# Patient Record
Sex: Male | Born: 1945
Health system: Southern US, Community
[De-identification: ages and names within clinical notes are randomized; demographics above are authoritative.]

## PROBLEM LIST (undated history)

## (undated) ENCOUNTER — Emergency Department (HOSPITAL_COMMUNITY): Admission: EM | Payer: Medicare Other | Source: Home / Self Care

## (undated) DIAGNOSIS — N182 Chronic kidney disease, stage 2 (mild): Secondary | ICD-10-CM

## (undated) DIAGNOSIS — I639 Cerebral infarction, unspecified: Secondary | ICD-10-CM

## (undated) DIAGNOSIS — G459 Transient cerebral ischemic attack, unspecified: Secondary | ICD-10-CM

## (undated) DIAGNOSIS — I5042 Chronic combined systolic (congestive) and diastolic (congestive) heart failure: Secondary | ICD-10-CM

## (undated) DIAGNOSIS — C9590 Leukemia, unspecified not having achieved remission: Secondary | ICD-10-CM

## (undated) DIAGNOSIS — I2510 Atherosclerotic heart disease of native coronary artery without angina pectoris: Secondary | ICD-10-CM

## (undated) DIAGNOSIS — G510 Bell's palsy: Secondary | ICD-10-CM

## (undated) DIAGNOSIS — I10 Essential (primary) hypertension: Secondary | ICD-10-CM

## (undated) DIAGNOSIS — C9210 Chronic myeloid leukemia, BCR/ABL-positive, not having achieved remission: Secondary | ICD-10-CM

## (undated) DIAGNOSIS — E7800 Pure hypercholesterolemia, unspecified: Secondary | ICD-10-CM

## (undated) HISTORY — PX: ORIF FOREARM FRACTURE: SHX2124

## (undated) HISTORY — PX: BACK SURGERY: SHX140

## (undated) HISTORY — DX: Chronic myeloid leukemia, BCR/ABL-positive, not having achieved remission: C92.10

## (undated) HISTORY — DX: Transient cerebral ischemic attack, unspecified: G45.9

## (undated) HISTORY — PX: HIP ARTHROPLASTY: SHX981

---

## 2003-05-13 ENCOUNTER — Emergency Department (HOSPITAL_COMMUNITY): Admission: EM | Admit: 2003-05-13 | Discharge: 2003-05-13 | Payer: Self-pay | Admitting: *Deleted

## 2003-07-14 ENCOUNTER — Emergency Department (HOSPITAL_COMMUNITY): Admission: EM | Admit: 2003-07-14 | Discharge: 2003-07-15 | Payer: Self-pay | Admitting: Emergency Medicine

## 2003-08-19 ENCOUNTER — Other Ambulatory Visit: Payer: Self-pay

## 2004-05-19 ENCOUNTER — Inpatient Hospital Stay (HOSPITAL_COMMUNITY): Admission: RE | Admit: 2004-05-19 | Discharge: 2004-05-27 | Payer: Self-pay | Admitting: Psychiatry

## 2004-05-19 ENCOUNTER — Ambulatory Visit: Payer: Self-pay | Admitting: Psychiatry

## 2005-01-31 ENCOUNTER — Emergency Department (HOSPITAL_COMMUNITY): Admission: EM | Admit: 2005-01-31 | Discharge: 2005-01-31 | Payer: Self-pay | Admitting: Emergency Medicine

## 2005-03-24 ENCOUNTER — Emergency Department (HOSPITAL_COMMUNITY): Admission: EM | Admit: 2005-03-24 | Discharge: 2005-03-24 | Payer: Self-pay | Admitting: Emergency Medicine

## 2005-04-19 ENCOUNTER — Emergency Department (HOSPITAL_COMMUNITY): Admission: EM | Admit: 2005-04-19 | Discharge: 2005-04-19 | Payer: Self-pay | Admitting: Emergency Medicine

## 2005-04-29 ENCOUNTER — Ambulatory Visit: Payer: Self-pay | Admitting: Internal Medicine

## 2005-05-07 ENCOUNTER — Emergency Department (HOSPITAL_COMMUNITY): Admission: EM | Admit: 2005-05-07 | Discharge: 2005-05-07 | Payer: Self-pay | Admitting: Emergency Medicine

## 2005-08-04 ENCOUNTER — Ambulatory Visit: Payer: Self-pay | Admitting: Family Medicine

## 2005-08-23 ENCOUNTER — Ambulatory Visit (HOSPITAL_COMMUNITY): Admission: RE | Admit: 2005-08-23 | Discharge: 2005-08-23 | Payer: Self-pay | Admitting: Cardiology

## 2005-12-08 ENCOUNTER — Ambulatory Visit: Payer: Self-pay | Admitting: Physical Medicine & Rehabilitation

## 2005-12-08 ENCOUNTER — Encounter
Admission: RE | Admit: 2005-12-08 | Discharge: 2006-03-08 | Payer: Self-pay | Admitting: Physical Medicine & Rehabilitation

## 2006-02-03 ENCOUNTER — Ambulatory Visit: Payer: Self-pay | Admitting: Physical Medicine & Rehabilitation

## 2006-03-30 ENCOUNTER — Encounter
Admission: RE | Admit: 2006-03-30 | Discharge: 2006-06-28 | Payer: Self-pay | Admitting: Physical Medicine & Rehabilitation

## 2006-03-30 ENCOUNTER — Ambulatory Visit: Payer: Self-pay | Admitting: Physical Medicine & Rehabilitation

## 2006-06-23 ENCOUNTER — Encounter
Admission: RE | Admit: 2006-06-23 | Discharge: 2006-09-21 | Payer: Self-pay | Admitting: Physical Medicine & Rehabilitation

## 2006-06-23 ENCOUNTER — Ambulatory Visit: Payer: Self-pay | Admitting: Physical Medicine & Rehabilitation

## 2006-09-23 ENCOUNTER — Emergency Department (HOSPITAL_COMMUNITY): Admission: EM | Admit: 2006-09-23 | Discharge: 2006-09-23 | Payer: Self-pay | Admitting: Emergency Medicine

## 2006-10-10 ENCOUNTER — Encounter
Admission: RE | Admit: 2006-10-10 | Discharge: 2007-01-08 | Payer: Self-pay | Admitting: Physical Medicine & Rehabilitation

## 2006-10-10 ENCOUNTER — Ambulatory Visit: Payer: Self-pay | Admitting: Physical Medicine & Rehabilitation

## 2006-12-22 ENCOUNTER — Emergency Department (HOSPITAL_COMMUNITY): Admission: EM | Admit: 2006-12-22 | Discharge: 2006-12-22 | Payer: Self-pay | Admitting: Emergency Medicine

## 2007-01-03 ENCOUNTER — Inpatient Hospital Stay (HOSPITAL_BASED_OUTPATIENT_CLINIC_OR_DEPARTMENT_OTHER): Admission: RE | Admit: 2007-01-03 | Discharge: 2007-01-03 | Payer: Self-pay | Admitting: Cardiology

## 2007-02-03 ENCOUNTER — Emergency Department (HOSPITAL_COMMUNITY): Admission: EM | Admit: 2007-02-03 | Discharge: 2007-02-03 | Payer: Self-pay | Admitting: Emergency Medicine

## 2007-02-03 ENCOUNTER — Emergency Department (HOSPITAL_COMMUNITY): Admission: EM | Admit: 2007-02-03 | Discharge: 2007-02-03 | Payer: Self-pay | Admitting: *Deleted

## 2007-04-20 ENCOUNTER — Encounter
Admission: RE | Admit: 2007-04-20 | Discharge: 2007-06-26 | Payer: Self-pay | Admitting: Physical Medicine & Rehabilitation

## 2007-04-20 ENCOUNTER — Ambulatory Visit: Payer: Self-pay | Admitting: Physical Medicine & Rehabilitation

## 2007-07-03 ENCOUNTER — Emergency Department (HOSPITAL_COMMUNITY): Admission: EM | Admit: 2007-07-03 | Discharge: 2007-07-03 | Payer: Self-pay | Admitting: Emergency Medicine

## 2007-08-15 ENCOUNTER — Encounter
Admission: RE | Admit: 2007-08-15 | Discharge: 2007-08-18 | Payer: Self-pay | Admitting: Physical Medicine & Rehabilitation

## 2007-08-18 ENCOUNTER — Ambulatory Visit: Payer: Self-pay | Admitting: Physical Medicine & Rehabilitation

## 2007-12-07 ENCOUNTER — Encounter
Admission: RE | Admit: 2007-12-07 | Discharge: 2007-12-15 | Payer: Self-pay | Admitting: Physical Medicine & Rehabilitation

## 2007-12-15 ENCOUNTER — Ambulatory Visit: Payer: Self-pay | Admitting: Physical Medicine & Rehabilitation

## 2008-03-14 ENCOUNTER — Encounter
Admission: RE | Admit: 2008-03-14 | Discharge: 2008-03-15 | Payer: Self-pay | Admitting: Physical Medicine & Rehabilitation

## 2008-03-15 ENCOUNTER — Ambulatory Visit: Payer: Self-pay | Admitting: Physical Medicine & Rehabilitation

## 2008-04-15 ENCOUNTER — Emergency Department (HOSPITAL_COMMUNITY): Admission: EM | Admit: 2008-04-15 | Discharge: 2008-04-15 | Payer: Self-pay | Admitting: Emergency Medicine

## 2008-05-31 ENCOUNTER — Encounter: Admission: RE | Admit: 2008-05-31 | Discharge: 2008-06-04 | Payer: Self-pay | Admitting: Anesthesiology

## 2008-06-04 ENCOUNTER — Ambulatory Visit: Payer: Self-pay | Admitting: Anesthesiology

## 2010-05-03 ENCOUNTER — Encounter: Payer: Self-pay | Admitting: Cardiology

## 2010-08-25 NOTE — Assessment & Plan Note (Signed)
Bruce [name] returns to clinic today for followup evaluation.  Overall  he is reporting that he is doing well.  He has had plain films of his  right hip which reportedly show no abnormality.  He reports that he is  due for a followup MRI scan with Dr. Cleophas Dunker.  The patient reports  that he continues to use the Percocet and gets good relief overall.  He  does need a refill on the Percocet over the next couple days.   MEDICATIONS:  1. Percocet 10/325 one tablet 4 times per day p.r.n.  2. Xanax p.r.n.   REVIEW OF SYSTEMS:  Positive for night sweats.   PHYSICAL EXAMINATION:  Well-appearing, middle-aged adult male in mild to  no acute discomfort.  Blood pressure is 154/88 with pulse of 70,  respiratory rate 17, and O2 saturation 98% on room air.  Patient has 5/5  strength throughout.   IMPRESSION:  Ongoing chronic right hip pain and lumbar pain after work  injury in the 1990s.   In the office today a refill on the Percocet was given to the patient  dated for October 19, 2006.  Will plan on seeing him in followup in  approximately 4 month's time with refills prior to that appointment as  necessary.           ______________________________  Ellwood Dense, M.D.     DC/MedQ  D:  10/17/2006 14:16:31  T:  10/17/2006 16:39:42  Job #:  [id number]

## 2010-08-25 NOTE — Cardiovascular Report (Signed)
[name], Bruce             ACCOUNT NO.:  [id number]   MEDICAL RECORD NO.:  [id number]          PATIENT TYPE:  OIB   LOCATION:  NA                           FACILITY:  [facility]   PHYSICIAN:  Mohan N. Sharyn Lull, M.D. DATE OF BIRTH:  02-Mar-1946   DATE OF PROCEDURE:  01/03/2007  DATE OF DISCHARGE:                            CARDIAC CATHETERIZATION   PROCEDURE:  Left cardiac catheterization with selective left and right  coronary angiography, LV-graphy via right groin using Judkins technique.   INDICATIONS FOR PROCEDURE:  Bruce [name] is a 65 year old black male  with past medical history significant for hypertension, tobacco abuse,  and degenerative joint disease.  Complains of vague chest pain off and  on, without associated symptoms of nausea, vomiting, or diaphoresis.  The patient also complains of exertional dyspnea with minimal exertion  associated with feeling weak and tired.  The patient also gives a  history of claudication pain in the legs and hips. The patient underwent  Persantine Myoview in May 2007 which showed moderate area of stress-  induced inferior septal ischemia, with EF of 47%.  The patient opted  initially for medical management, and due to recurrent chest pain  consented for left catheterization and referred for left  catheterization.   PAST MEDICAL HISTORY:  As above.   PAST SURGICAL HISTORY:  1. He had back surgery in the past.  2. He had right hip surgery in the past.   ALLERGIES:  NO KNOWN DRUG ALLERGIES.   MEDICATIONS AT HOME:  1. Atacand 32/12.5 mg p.o. daily.  2. Tekturna 300 mg p.o. daily.  3. Xanax 1 mg p.o. t.i.d.  4. Enteric-coated aspirin 81 mg 1 tablet daily,  5. Nitrostat 0.4 mg sublingual p.r.n.   SOCIAL HISTORY:  He is single.  No children.  Smoked one pack per day  for 30+ years.  Used to drink whiskey and beer for many years, and quit  recently, approximately 2 months ago.  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  Born in [place].  Lives in  [place].   FAMILY HISTORY:  Father died of diabetic complications and gangrene of  the feet at the age of 65.  Mother died of diabetic complications at the  age of 1.  One sister is diabetic.  One brother is in good health.   PHYSICAL EXAMINATION:  GENERAL:  He was alert, awake, oriented x3, in no  acute distress.  VITAL SIGNS:  Blood pressure was 130/82, pulse was 68 and regular.  HEENT:  Conjunctiva was pink.  NECK:  Supple.  No JVD.  LUNGS:  Clear to auscultation.  CARDIOVASCULAR:  S1, S2 was normal.  There was a soft systolic murmur.  ABDOMEN:  Soft.  Bowel sounds were present.  Obese, nontender.  EXTREMITIES:  There was no clubbing, cyanosis, or edema.   IMPRESSION:  1. Atypical chest pain.  2. Positive Persantine Myoview.  3. Hypertension.  4. Tobacco abuse.  5. History of alcohol abuse.  6. Degenerative joint disease.   Discussed with the patient regarding left catheterization, its risks and  benefits,  i.e. death, MI, stroke, local vascular complications.  He  accepted and consented for the procedure.   PROCEDURE:  After obtaining the informed consent, the patient was  brought to the catheterization lab and was placed on the fluoroscopy  table.  Right groin was prepped and draped in the usual fashion.  2%  Xylocaine was used for local anesthesia in the right groin.  With help  of thin-wall needle, a 4-French arterial sheath was placed.  The sheath  was aspirated and flushed.  Next, a 4-French left Judkins catheter was  advanced over the wire under fluoroscopic guidance into the ascending  aorta.  Wire was pulled down.  The catheter was aspirated and connected  to the manifold.  Catheter was further advanced and engaged into the  left coronary ostium.  Multiple views of the left system were taken.  Next, the catheter was disengaged and was pulled out over the wire and  was replaced with 4-French 3-D right diagnostic  catheter which was  advanced over the wire under fluoroscopic guidance to the ascending  aorta.  Wire was pulled out.  The catheter was aspirated and connected  to the manifold.  Catheter was further advanced and engaged into the  right coronary ostium.  Multiple views of the right system were taken.  Next, the catheter was disengaged and was pulled out over the wire and  was replaced with a 4-French pigtail catheter which was advanced over  the wire under fluoroscopic guidance up to the ascending aorta.  Catheter was further advanced across the aortic valve into the LV.  LV  pressures were recorded.  Next, LV-gram was done in the 30-degree RAO  position.  Postangiographic pressures were recorded from LV, and then  pullback pressures were recorded from the aorta.  There was no gradient  across the aortic valve.  Next, the pigtail catheter was pulled out over  the wire.  Sheaths were aspirated and flushed.   FINDINGS:  LV showed mild global hypokinesia, LVH.  LV was mildly  enlarged, EF of approximately 40%.  Left main was short, which was  patent.  LAD was patent.  Diagonal 1 was small, which was patent.  Diagonal 2 was very small, which was patent.  Left circumflex was  patent.  OM1 was very small, which was patent.  OM2 was very small,  which was patent.  OM 3 moderate size, which was patent.  RCA has 30%-  40% proximal stenosis and 20%-30% mid-focal stenosis.  PDA is small,  which is patent.  PLV branches are very, very small, less than 1 mm,  that are  diffusely diseased.   The patient tolerated the procedure well.  There were no complications.  The patient was transferred to the recovery room in stable condition.      Eduardo Osier. Sharyn Lull, M.D.  Electronically Signed     Bruce [name]  D:  01/03/2007  T:  01/04/2007  Job:  [id number]   cc:   Osvaldo Shipper. Spruill, M.D.  Catheterization Laboratory

## 2010-08-25 NOTE — Assessment & Plan Note (Signed)
Mr. Morad returns to clinic today for followup evaluation.  He  continues to report that he is getting good relief from his Percocet  used 4 times per day.  He does need a refill in the office today.  He  understands that he has chronic pain and no further surgery is  anticipated.  He has had multiple surgeries on his right leg back in the  1990s.  He plans to contact his primary care physician for a refill on  Xanax as needed.   MEDICATIONS:  1. Percocet 10/325 one tablet 4 times per day p.r.n.  2. Xanax p.r.n.   REVIEW OF SYSTEMS:  Noncontributory.   PHYSICAL EXAMINATION:  A well-appearing, middle-aged adult mild in mild  acute discomfort.  Blood pressure 172/95 with a pulse of 59, respiratory rate 18, and O2  saturation 96% on room air.  The patient has 5/5 strength throughout.   IMPRESSION:  Ongoing, chronic right hip pain and lumbar pain after work  injury in the 1990s.   In the office today we did refill the patient's Percocet as of today,  April 21, 2007, a total of 120 which should cover him for at least a  month.  Will plan on seeing the patient in followup in this office in  approximately 4 months' time with refills prior to that appointment as  necessary.           ______________________________  Ellwood Dense, M.D.     DC/MedQ  D:  04/21/2007 09:49:03  T:  04/21/2007 10:20:48  Job #:  [id number]

## 2010-08-25 NOTE — Assessment & Plan Note (Signed)
Bruce [name] returns to clinic today for followup evaluation.  He  unfortunately missed his clinic visit couple days ago.  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  He reports that he feels much safer in the present  unit.  He does need a refill on his Percocet that he uses approximately  3-4 times per day.  He also plans to follow up with Dr. Shana Chute, his  primary care physician, for a refill on blood pressure medicines and to  be checked out for diabetes.  He has concerned because multiple family  members have recently been told they have diabetes.   REVIEW OF SYSTEMS:  Positive for weight gain.   MEDICATIONS:  1. Percocet 10/325 one tablet q.i.d. p.r.n. (3-4 per day).  2. Xanax p.r.n.  3. Unknown blood pressure medicine (not taking at present).   PHYSICAL EXAMINATION:  GENERAL:  Well-appearing, middle-aged adult male  in mild-to-no acute discomfort.  VITAL SIGNS:  Blood pressure 170/73 with pulse 71, respiratory rate 18,  and O2 saturation 97% on room air.  He ambulates without any assistive  device.  He has 5/5 strength throughout.   IMPRESSION:  Chronic right hip pain and lumbar pain after work injury in  the 1990s.   In the office today, we did refill the patient's Percocet a total of 120  to cover him for a month's time dated today December 15, 2007.  We will  plan on seeing the patient in followup in approximately 3 months' time  with refills prior to that appointment if necessary.           ______________________________  Ellwood Dense, M.D.     DC/MedQ  D:  12/15/2007 09:46:43  T:  12/16/2007 00:09:39  Job #:  [id number]

## 2010-08-25 NOTE — Assessment & Plan Note (Signed)
[facility].  I evaluated  him and reviewed the health and history form and 14-point review of  systems.   Review progress today to overall directed care approach.  He comes to [place]  complaining of widespread pain, he has difficulty with endurance and  range of motion, right hip pain, secondary to work-related injury, and  has been taking oxycodone.  He was seen by another Suhayb Anzalone in another  city.  We confirmed that.  We called [facility], and checked his background.  We reviewed the opioid consent and the patient's care agreement, and I  discussed treatment options with him.  He is taking Percocet, he has  been taking Percocet up to four times a day, dating adequate relief to  cycling, some breakthrough.  He is, however, not having to take it as  regularly lately, and we also discussed concerns as to acetaminophen.   OBJECTIVE:  He has been diffuse suprascapular, paracervical, and  paralumbar myofascial discomfort.  I observed the incision on his hip,  which is extensive, he has pain with most of range of motion activities.  I do not find anything new neurologically.  Mostly myofascial  mechanical.   IMPRESSION:  Osteoarthritis to hip, degenerative spine disease, lumbar  spine.   PLAN:  1. We will drop his analgesic low to 5 mg Roxicodone, eliminate the      acetaminophen.  I will review that with him.  Slow movement away      from controlled substances, possibly to a pharmacokinetically long-      acting nonnarcotic options, such as Ultram.  2. Weight control is the best outcome.  3. UDS full informed consent.  We will see him back in a few weeks.      Questions were answered.  Consider interventional procedures.           ______________________________  Celene Kras, MD     HH/MedQ  D:  06/04/2008 17:21:04  T:  06/05/2008 04:52:46  Job #:  [id number]

## 2010-08-25 NOTE — Assessment & Plan Note (Signed)
Mr. Schurman returns to the clinic today for followup evaluation.  I  last saw him in this office on December 15, 2007, when we refilled his  Percocet.  He subsequently started seeing a Dr. Wess Botts with the [facility].  The specialist apparently filled his prescription for  him for the Percocet sometime in October but was not willing to refill  the prescription in early November.  He is coming in today saying that  we switch him back to getting his pain medicines through this office and  does need to refill in the office today.   The patient today reports that he would also like [place] to take over  prescribing his Xanax but is not sure of the dose.  He plans to call  back today to let us know what the dose is so that we can start  prescribing that for him.  He will continue with his primary care  physician, Dr. Shana Chute, for blood pressure medications.   REVIEW OF SYSTEMS:  Positive for night sweats.   MEDICATIONS:  1. Percocet 10/325 one tablet q.i.d. p.r.n. (3-4 per day).  2. Xanax p.r.n.  3. Unknown blood pressure medicines daily.   PHYSICAL EXAMINATION:  Reasonably well-appearing, moderately overweight  adult male in mild-to-no acute discomfort.  Blood pressure 164/83 with  pulse 70, respiratory rate 18, and O2 saturation 96% on room air.  He  has 5/5 strength throughout.  He ambulates without any assistive device.   IMPRESSION:  1. Chronic right hip pain and lumbar pain after work injury in the      1990s.  2. In the office today, we did refill the patient's Percocet, and we      will continue to do so through this office.  I warned him against      using multiple offices, but he actually has not used multiple      offices instead he has alternated at this point.  He still needs to      choose one office.  He seems to be comfortable coming to this      office at the present time.  We will prescribe Xanax for him once      he makes [place] aware of what the dose is.  We will  plan on seeing the      patient in followup in approximately 3 months' time with refills      prior to that appointment if necessary.           ______________________________  Ellwood Dense, M.D.     DC/MedQ  D:  03/15/2008 12:07:26  T:  03/16/2008 06:28:30  Job #:  [id number]

## 2010-08-25 NOTE — Assessment & Plan Note (Signed)
This is a followup office note.   Bruce [name] returns to the clinic today for followup evaluation.  He is  doing well overall.  He reports that he uses Percocet generally 3-5  tablets per day but he still has a few remaining from his script filled  on July 17, 2007.  He certainly is not using it over the prescribed  amount of 4 per day.  He does need a refill today in the office.   He has an elevated blood pressure in the office today and reports that  he will be seeing Dr. Shana Chute on Aug 23, 2007 for evaluation of blood  pressure problems and his general physical.  He continues to get his  Xanax through Dr. Magda Kiel office.   MEDICATIONS:  1. Percocet 10/325 one tablet q.i.d. p.r.n. (3-4 per day).  2. Xanax p.r.n.   REVIEW OF SYSTEMS:  Positive for night sweats, coughing, and wheezing.   PHYSICAL EXAMINATION:  GENERAL:  Well-appearing, middle-aged, adult male  in mild acute discomfort.  VITAL SIGNS:  Blood pressure 181/85 with a pulse of 74, respiratory rate  18, and O2 saturation 97% on room air.  MUSCULOSKELETAL:  He ambulates without any assistive device.  He has 5/5  strength throughout.   IMPRESSION:  Ongoing chronic right hip pain and lumbar pain after a work  injury in the 1990s.   In the office today, we did refill the patient's Percocet as of today at  1 tablet q.i.d. a total of 120.  He gets his Xanax through Dr. Magda Kiel  office and will be following up with Dr. Shana Chute regarding blood  pressure medication for hypertension.  We will plan on seeing the  patient in followup in approximately 3 to 67-months' time with refills  prior to that appointment as necessary.           ______________________________  Bruce [name], M.D.     DC/MedQ  D:  08/18/2007 15:43:48  T:  08/18/2007 16:21:18  Job #:  [id number]

## 2010-08-28 NOTE — Assessment & Plan Note (Signed)
[name] returns to clinic today for follow-up evaluation.  He has  numerous questions about low back pain.  He also is concerned about  diabetes mellitus, which runs in his family.   The patient has received primary care from Dr. Louann Sjogren in [place] in  the past when he lived in [place].  He also has lived locally and  received primary care from Dr. Shana Chute.  I have told him that he could  go back to either of those physicians for evaluation of his diabetes.   In terms of his pain medicines, he uses the Percocet approximately 4  times per day, and occasionally even a few extra tablets.  He is nearly  out of the prescription prescribed March 07, 2006.  He did not bring  in the pills, but says that he has approximately 6 remaining.  He  reports problems walking involving his right hip where he has had prior  fluid removed.   MEDICATIONS:  1. Percocet 10/325 one tablet q.i.d.  2. Klonopin 1 mg b.i.d. p.r.n.   REVIEW OF SYSTEMS:  Positive for night sweats and coughing.   PHYSICAL EXAMINATION:  A well-appearing, slightly overweight adult male  in mild acute discomfort.  Blood pressure 153/71 with a pulse of 66, respiratory rate 16, and O2  saturation 97% on room air.  He has 5/5 strength at the bilateral upper extremities, and 4+ to 5-/5  strength at the bilateral lower extremities.   IMPRESSION:  Chronic right hip pain and lumbar pain after work injury in  the 1990s.   In the office today we did refill the patient's Percocet, total of 120  to cover him for 4 per day.  Will plan on seeing him in followup in  approximately 2-3 months with refills prior to that appointment as  necessary.  He will be following up with either one of his primary care  physicians, either here locally, or in [facility] for diabetes  evaluation.           ______________________________  Ellwood Dense, M.D.     DC/MedQ  D:  04/01/2006 10:57:28  T:  04/01/2006 20:24:12  Job #:  [id number]

## 2010-08-28 NOTE — Discharge Summary (Signed)
Bruce [name], Bruce [name] NO.:  [id number]   MEDICAL RECORD NO.:  [id number]          PATIENT TYPE:  IPS   LOCATION:  0502                          [facility]ILITY:  [facility]   PHYSICIAN:  Geoffery Lyons, M.D.      DATE OF BIRTH:  December 15, 1945   DATE OF ADMISSION:  05/19/2004  DATE OF DISCHARGE:  05/27/2004                                 DISCHARGE SUMMARY   CHIEF COMPLAINT AND PRESENT ILLNESS:  This was the first admission to [facility] for this 65 year old single African-American male  voluntarily admitted.  History of alcohol abuse.  Has been drinking a fifth  of liquor daily.  Initially, upon assessment, had some problem with level of  consciousness.  Was sent to the emergency department for suspected overdose.  Did allude to taking some Klonopin.  Was medically cleared and sent back to  the unit.  He denied any suicidal or homicidal thoughts or psychotic  symptoms.  Does report a history of drug use.   PAST PSYCHIATRIC HISTORY:  First admission to [facility].   ALCOHOL/DRUG HISTORY:  Drinking a fifth of liquor daily.  Denied any  seizures.   MEDICAL HISTORY:  Hypertension.   MEDICATIONS:  Klonopin 1 mg daily, Paxil 20 mg daily, lovastatin.   PHYSICAL EXAMINATION:  Performed and failed to show any acute findings.   LABORATORY DATA:  Hemoglobin 11.6, hematocrit 34.4.  Urine drug screen  positive for benzodiazepines, opioids, cocaine.  Urinalysis was negative.  SGOT 22, SGPT 19.  TSH 1.067.   MENTAL STATUS EXAM:  Male who is sedated, drifts off to sleep if not  stimulated.  Speech was very concrete.  Sleepy.  Speech was not as  spontaneous, somewhat slurred.  Thought processes with no spontaneous  content.  Answers some questions appropriately.  No evidence of delusions.  No hallucinations.  Cognition was well-preserved.   ADMISSION DIAGNOSES:   AXIS I:  1.  Polysubstance abuse.  2.  Rule out mood disorder not otherwise specified.   AXIS II:   No diagnosis.   AXIS III:  1.  Back pain.  2.  Headaches.   AXIS IV:  Moderate.   AXIS V:  Global Assessment of Functioning upon admission 35; highest Global  Assessment of Functioning in the last year 55.   HOSPITAL COURSE:  He was admitted and started in individual and group  psychotherapy.  He was given Ambien for sleep.  He was detoxified with  Librium.  He was given Prilosec 20 mg per day, Paxil 30 mg daily, Lovastatin  20 mg daily, Symmetrel 100 mg twice a day.  He did endorse drinking one pint  to one-fifth every two or three days and cocaine as he can get it.  Endorsed  persistent pain.  He was using the Tylenol No. 3., which was held due to the  possibility of an overdose.  Tylenol level was not elevated.  Endorsed he  was homeless.  Endorsed he was going to get some money and eventually get  his own apartment back.  We pursued the detox.  Endorsed anxiety and  voices.  He was started on Risperdal 0.25 mg at night.  He tolerated it well.  Complained of voices, so it was increased to 0.5 mg at bedtime.  The  Risperdal continued to be adjusted.  Eventually taking 1 mg at night.  By  February 15th, he was doing better.  Endorsed no more hallucinations.  Sleeping better.  Endorsed no tremors.  Endorsed no suicidal or homicidal  ideation.  On February 15th, he was stable enough that discharge was  considered and granted.  He was going to follow up on an outpatient basis.   DISCHARGE DIAGNOSES:   AXIS I:  1.  Polysubstance abuse.  2.  Mood disorder not otherwise specified.  3.  Psychotic disorder not otherwise specified.   AXIS II:  No diagnosis.   AXIS III:  1.  Back pain.  2.  Headaches.   AXIS IV:  Moderate.   AXIS V:  Global Assessment of Functioning upon discharge 50.   DISCHARGE MEDICATIONS:  1.  Symmetrel 100 mg twice a day.  2.  Risperdal 1 mg at night.  3.  Paxil 30 mg daily.  4.  Cogentin 0.5 mg at night.  5.  Protonix 40 mg daily.  6.  Zocor 10 mg  daily.   FOLLOW UP:  [facility].      IL/MEDQ  D:  06/24/2004  T:  06/25/2004  Job:  [id number]

## 2010-08-28 NOTE — Assessment & Plan Note (Signed)
DATE OF VISIT:  June 23, 2006.   [name] returns to the clinic today for follow-up evaluation. He  reports he is getting good relief from his Percocet used x4 per day. He  had tried Tylenol #3 in the past but that did not work. He is interested  in getting some antacid medication and he plans on calling Dr. Louann Sjogren in  [facility] who is his primary care physician.  The patient does not  need a refill on the Percocet at this time. He also reports that Dr.  Shana Chute has started him on Xanax in place of the Klonopin and the  patient feels that works better for him. He does have slight pain in the  left abdomen which has been present for the past 5-6 days but he reports  no significant fevers and regular bowel movements without complaints of  chest pain.   MEDICATIONS:  1. Percocet 10/325 one tablet x4 daily p.r.n.  2. Xanax p.r.n.   REVIEW OF SYSTEMS:  Noncontributory.   PHYSICAL EXAMINATION:  Well appearing, slightly overweight adult male in  mild acute discomfort. Blood pressure 164/84 with a pulse of 57,  respiratory rate 16 and O2 saturation 97% on room air. He has 5 minus  over 5 strength of bilateral upper and lower extremities. Bulk and tone  were normal.   IMPRESSION:  Chronic right hip pain and lumbar pain after work injury in  the 1990's.   In the office today no refill on his Percocet is necessary. He will call  in next week for a refill as the last prescription was given to him on  approximately the 22nd of February.  Will plan on seeing him in follow-  up in approximately 4 month's time with refills prior to that  appointment as necessary.           ______________________________  Ellwood Dense, M.D.     DC/MedQ  D:  06/24/2006 11:33:19  T:  06/25/2006 16:27:41  Job #:  [id number]

## 2010-08-28 NOTE — Assessment & Plan Note (Signed)
REFERRAL:  Osvaldo Shipper. Spruill, M.D.   PURPOSE OF EVALUATION:  Evaluate and treat chronic back and hip pain.   HISTORY OF PRESENT ILLNESS:  Bruce [name] is a 65 year old adult male  referred to this office by Dr. Shana Chute for evaluation and treatment of  chronic back and hip pain.   The patient is a poor historian and minimal medical records accompany the  patient.  We did our best to try to recreate the past medical history  leading up to this visit.  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  He reports that a large amount of fabric crashed down on him from above  injuring his right hip and fracturing his right femur.  He also reported  injury to his right foot at that time.   The patient did undergo hip pinning approximately 1995 in [place] with Dr.  Louann Sjogren. The patient reports that he has had numerous surgeries since that time  with his last hip surgery being done in 1996.  He also reports a back  surgery of unknown nature being done in the mid-1990's.  The patient reports  that over the past few months to years his pain has worsened in his right  hip.  He has been back to see Dr. Louann Sjogren and basically they could not do  anything surgically to help him.  The patient reports that he was seeing Dr.  Shana Chute and actually got benefit from Percocet 10/325 mg used two to three  times per day.  He reports that he had tried Tylenol without any benefit and  also had tried hydrocodone without any benefit.  Dr. Shana Chute apparently has  sent him here for pain control with use of non-narcotics if possible.   Presently, the patient complains of persistent chronic pain of his right  hip, which he describes as aching.  He reports that it is difficult for him  to stand upright and he also has pain over his right upper thigh region on  the right.   PAST MEDICAL HISTORY:  1. Dyslipidemia.  2. Degenerative joint disease.  3.  Hypertension.  4. Anxiety.  5. History of angina.  6. Numerous right hip surgeries.  7. Prior lumbar surgery.   FAMILY HISTORY:  His mother and father are deceased from diabetes mellitus  complications.   ALLERGIES:  No known drug allergies.   SOCIAL HISTORY:  The patient is widowed and lives alone, although he does  have a male friend who helps occasionally.  He has no children.  He smokes  one pack of cigarettes per day and denies alcohol usage.  He also denies any  other street drug use.   REVIEW OF SYSTEMS:  Noncontributory.   MEDICATIONS:  1. Percocet 10/325 mg 1 tablet t.i.d. p.r.n.  2. Klonopin 1 tablet b.i.d. p.r.n.   PHYSICAL EXAMINATION:  A reasonably well-appearing middle-aged adult male in  mild acute discomfort, dressed casually in the office.  Blood pressure was  162/84 with a pulse of 84, respiratory rate 18 and O2 saturation 96% on room  air.  Height was 5 foot 8 inches.  Weight 196 pounds.  The patient ambulates  in the office without any assistive device.  He was able to minimally toe  walk and heel walk bilaterally.  Upper extremity range of motion was full on  the right and complaints of pain with shoulder range of motion on the left,  although he was able to obtain near full flexion and abduction.  Internal  and external rotation of the shoulders were intact.  Bilateral upper  extremity exam showed normal bulk and tone throughout.  Reflexes were 2+ and  symmetrical and sensation was intact to light touch throughout the bilateral  upper extremities.  Strength was 5-/5 throughout the bilateral upper  extremities.   Lumbar range of motion was decreased in all planes, especially in the lumbar  flexion and extension.  Lower extremity showed well-healed scar of his right  upper thigh and buttock.  He has -5/5 strength in the left lower extremity  and 4/5 strength in the right lower extremity.  Bulk and tone were normal  and reflexes were 2+ and symmetrical.   Sensation was intact to light touch  throughout the bilateral lower extremities.   IMPRESSION:  Chronic right hip and lumbar pain with reported significant  work injury dating from 1990's.  At the present time, the patient reports  that he has only gained benefit from Percocet without any relief from  Tylenol No. 3 or hydrocodone.  We have given a prescription for Percocet  10/325 mg to be used 1 tablet p.o. t.i.d. p.r.n., a total of 90 with no  refills.  He has been instructed explicitly to use it no more than three  times per day and also instructed that if he does not have pain he does not  need to use the medicine.  His overall cognition is poor and explicit  instructions were reviewed with him in the office today.  I think the  patient will have trouble calling in for refills so we will try to set him  up for refills on a scheduled basis.  We will plan on seeing the patient in  follow up in approximately one months' time when we will refill medicines in  person.           ______________________________  Ellwood Dense, M.D.     DC/MedQ  D:  12/09/2005 11:46:06  T:  12/10/2005 62:95:28  Job #:  [id number]

## 2010-08-28 NOTE — Assessment & Plan Note (Signed)
[name] returns to clinic today for follow up evaluation.  He is a 65-  year-old adult male who I first and law saw in this office December 09, 2005,  on referral from Dr. Shana Chute, his primary care physician.  The patient has a  history of chronic right hip and lumbar pain dating back to surgeries  performed last in 1996.  That is the time that he had his last hip surgery  and he has had numerous hip surgeries prior to that.   When I saw the patient in August we had started him on Percocet 10/325 one  tablet t.i.d. p.r.n.  He reports that he generally takes 3 per day and gets  good relief at the present time.  He reports that he also uses a hot pack on  his back on a periodic basis.  He experiencing no adverse side effects and  getting good analgesic effect at the present time.   MEDICATIONS:  1. Percocet 10/325 one tablet t.i.d. p.r.n.  2. Klonopin one tablet b.i.d. p.r.n.   REVIEW OF SYSTEMS:  Noncontributory.   PHYSICAL EXAM:  A well-appearing middle-aged adult male in mild to no acute  discomfort.  Blood pressure 159/91 with a pulse 80, respiratory rate 16, and  an O2 saturation of 97% on room air.  He has 5-/5 strength throughout the  bilateral upper and lower extremities.  Bulk and tone were normal, refluxes  were 2+ and symmetrical.  Sensation was intact to light touch throughout.   IMPRESSION:  Chronic right hip and lumbar pain with reported work injury  dating from the 1990's.   In the office today we did refill the patient's Percocet 10/325 one tablet  t.i.d. p.r.n.  No medicine changes were made at this point.  He is getting  good analgesic effect.   Will plan on seeing the patient in follow up in approximately 1 month's  time.           ______________________________  Ellwood Dense, M.D.     DC/MedQ  D:  01/07/2006 11:46:34  T:  01/09/2006 14:05:29  Job #:  [id number]

## 2010-08-28 NOTE — Assessment & Plan Note (Signed)
Bruce [name] returns to the clinic today for follow-up evaluation.  He  reports with the colder weather he has had increased pain.  He is using his  Percocet 10/325, 3 times per day but generally finds that he has some hours  throughout the day that he does not get total relief when he stretches them  out.  He does use hot pack on his right hip on a periodic basis.  He reports  that he is going back to see a Dr. Louann Sjogren in [place].  Dr. Louann Sjogren  apparently, according to the patient, has removed fluid from his right hip  in the past.   MEDICATIONS:  1. Percocet 10/325, 1 tablet t.i.d. p.r.n.  2. Klonopin 1 mg b.i.d. p.r.n.   REVIEW OF SYSTEMS:  Noncontributory.   PHYSICAL EXAM:  GENERAL:  Well-appearing, fit, adult male in mild acute  discomfort.  VITAL SIGNS:  Blood pressure 144/89 with a pulse of 68, respiratory 16, and  O2 saturation of 98% on room air.  The patient has 5/5 strength throughout the bilateral upper extremities.  Right lower extremity strength was 4-/5 and left lower extremity strength  was 4+ to 5/5.   IMPRESSION:  Chronic right hip and lumbar pain after work injury from the  1990s.   In the office today we did increase the patient's Percocet 1 tablet q.i.d.  instead of t.i.d.  Prescription that he has today should cover him through  approximately March 07, 2006.  We will plan on seeing him in followup in  two months' time with refills prior to that appointment if necessary.           ______________________________  Ellwood Dense, M.D.     DC/MedQ  D:  02/04/2006 09:09:05  T:  02/05/2006 11:43:29  Job #:  [id number]

## 2010-08-28 NOTE — H&P (Signed)
Bruce [name], DEGEN NO.:  [id number]   MEDICAL RECORD NO.:  [id number]          PATIENT TYPE:  IPS   LOCATION:  0502                          FACILITY:  [facility]   PHYSICIAN:  Geoffery Lyons, M.D.      DATE OF BIRTH:  27-Mar-1946   DATE OF ADMISSION:  05/19/2004  DATE OF DISCHARGE:                         PSYCHIATRIC ADMISSION ASSESSMENT   IDENTIFYING INFORMATION:  A 65 year old single African-American male  voluntarily admitted on May 19, 2004.   HISTORY OF PRESENT ILLNESS:  The patient presents with a history of alcohol  abuse.  The patient has been drinking a fifth of liquor daily.  When patient  was initially assessed on the unit, the patient apparently had some problems  with level of consciousness.  The patient was sent to the emergency  department with a suspected overdose.  The patient did allude to taking some  Klonopin.  The patient was medically cleared in the emergency room and sent  back the unit.  Patient has denied any suicidal or homicidal thoughts or  psychotic symptoms.  He does report a history of drug use as well.   PAST PSYCHIATRIC HISTORY:  First admission to [facility].  This is patient's first detox.   SOCIAL HISTORY:  This is a 65 year old single African-American male who  lives alone on disability.  He states that he had an injury on the job,  where he apparently got crushed.   FAMILY HISTORY:  Unknown.   ALCOHOL OR DRUG HISTORY:  Patient smokes.  He has been drinking a fifth of  liquor daily.  He denies any seizures.   PRIMARY CARE PHYSICIAN:  Dr. Bailey Mech at [phone].   MEDICAL PROBLEMS:  Hypertension.   MEDICATIONS:  1.  The patient has been on Klonopin 1 mg daily.  2.  Paxil 30 mg daily.  3.  Lovastatin.   DRUG ALLERGIES:  None.   PHYSICAL EXAMINATION:  Patient was assessed at [facility] for suspected  overdose.  He is today very sleepy, falls readily asleep when patient is  momentarily stimulated.   Temperature 98.6, respiratory rate 20, blood  pressure 148/87, weight 189 pounds.  His O2 sat is 97%.   LABORATORY DATA:  His laboratory data shows hemoglobin 11.6, hematocrit  34.4, glucose 101, acetaminophen level less than 10.  Urine drug screen was  positive for benzos, positive for opiates, positive for cocaine.  Urinalysis  was negative.  AST is 22, ALT is 19.  TSH is 1.067.   MENTAL STATUS EXAM:  This is a middle-aged male who is very sleepy.  He  drifts off to sleep in just a moment if he is not stimulated.  Speech is  very concrete.  The patient is sleepy.  The patient appears the same.  Speech is slurred.  Thought process, the patient is answering some questions  appropriately.  Cognitive function is intact.  Memory is fair.  Judgment is  fair.  Insight in partial, as the patient was seeking help from his alcohol  use.  He is a very poor historian.   DIAGNOSES:   AXIS I:  1.  Mood disorder, not otherwise specified.  2.  Polysubstance abuse, psychosis.   AXIS II:  Deferred.   AXIS III:  1.  Back pain.  2.  Headaches.   AXIS IV:  Other psychosocial problems, medical problems.   AXIS V:  Current is 35-60, estimated this past year is 7.   PLAN:  Detox the patient safely, stabilize mood and thinking.  We will  monitor closely with questionable history of an overdose, and the patient's  concurrent use of alcohol.  Patient will be placed on one-to-one initially  until patient awakens and is more coherent, not considered to be a high fall  risk.  We will work on relapse prevention.  Patient is to attend all  individual and group therapy.  Case manager is to look into any potential  rehab programs available.  Patient should attend AA and NA meetings after  discharge.  Tentative length of stay is 4-6 days or more depending on  patient's response and if any potential rehab program become available.      JO/MEDQ  D:  05/26/2004  T:  05/26/2004  Job:  [id number]

## 2010-09-11 ENCOUNTER — Emergency Department (HOSPITAL_COMMUNITY)
Admission: EM | Admit: 2010-09-11 | Discharge: 2010-09-11 | Disposition: A | Discharge status: Home / Self Care | Payer: Medicare Other | Attending: Emergency Medicine | Admitting: Emergency Medicine

## 2010-09-11 ENCOUNTER — Emergency Department (HOSPITAL_COMMUNITY): Payer: Medicare Other

## 2010-09-11 DIAGNOSIS — H11419 Vascular abnormalities of conjunctiva, unspecified eye: Secondary | ICD-10-CM | POA: Insufficient documentation

## 2010-09-11 DIAGNOSIS — R42 Dizziness and giddiness: Secondary | ICD-10-CM | POA: Insufficient documentation

## 2010-09-11 DIAGNOSIS — Z79899 Other long term (current) drug therapy: Secondary | ICD-10-CM | POA: Insufficient documentation

## 2010-09-11 DIAGNOSIS — K219 Gastro-esophageal reflux disease without esophagitis: Secondary | ICD-10-CM | POA: Insufficient documentation

## 2010-09-11 DIAGNOSIS — H579 Unspecified disorder of eye and adnexa: Secondary | ICD-10-CM | POA: Insufficient documentation

## 2010-09-11 DIAGNOSIS — R4789 Other speech disturbances: Secondary | ICD-10-CM | POA: Insufficient documentation

## 2010-09-11 DIAGNOSIS — Z8673 Personal history of transient ischemic attack (TIA), and cerebral infarction without residual deficits: Secondary | ICD-10-CM | POA: Insufficient documentation

## 2010-09-11 DIAGNOSIS — F411 Generalized anxiety disorder: Secondary | ICD-10-CM | POA: Insufficient documentation

## 2010-09-11 DIAGNOSIS — H109 Unspecified conjunctivitis: Secondary | ICD-10-CM | POA: Insufficient documentation

## 2010-09-11 DIAGNOSIS — R29898 Other symptoms and signs involving the musculoskeletal system: Secondary | ICD-10-CM | POA: Insufficient documentation

## 2010-09-11 DIAGNOSIS — Z9889 Other specified postprocedural states: Secondary | ICD-10-CM | POA: Insufficient documentation

## 2010-09-11 DIAGNOSIS — Z7982 Long term (current) use of aspirin: Secondary | ICD-10-CM | POA: Insufficient documentation

## 2010-09-11 DIAGNOSIS — I10 Essential (primary) hypertension: Secondary | ICD-10-CM | POA: Insufficient documentation

## 2010-09-11 DIAGNOSIS — H18419 Arcus senilis, unspecified eye: Secondary | ICD-10-CM | POA: Insufficient documentation

## 2010-09-11 DIAGNOSIS — R209 Unspecified disturbances of skin sensation: Secondary | ICD-10-CM | POA: Insufficient documentation

## 2010-09-11 DIAGNOSIS — I2510 Atherosclerotic heart disease of native coronary artery without angina pectoris: Secondary | ICD-10-CM | POA: Insufficient documentation

## 2010-09-11 LAB — DIFFERENTIAL
Basophils Absolute: 0 10*3/uL (ref 0.0–0.1)
Lymphocytes Relative: 39 % (ref 12–46)
Neutro Abs: 4.3 10*3/uL (ref 1.7–7.7)

## 2010-09-11 LAB — BASIC METABOLIC PANEL
CO2: 23 mEq/L (ref 19–32)
GFR calc non Af Amer: >60 mL/min (ref >60)
Glucose, Bld: 108 mg/dL — ABNORMAL HIGH (ref 70–99)
Potassium: 4 mEq/L (ref 3.5–5.1)
Sodium: 138 mEq/L (ref 135–145)

## 2010-09-11 LAB — CBC
HCT: 41.2 % (ref 39.0–52.0)
Hemoglobin: 14.3 g/dL (ref 13.0–17.0)
WBC: 8.3 10*3/uL (ref 4.0–10.5)

## 2010-10-07 ENCOUNTER — Emergency Department (HOSPITAL_COMMUNITY)
Admission: EM | Admit: 2010-10-07 | Discharge: 2010-10-07 | Disposition: A | Discharge status: Home / Self Care | Payer: Medicare Other | Attending: Emergency Medicine | Admitting: Emergency Medicine

## 2010-10-07 DIAGNOSIS — Z79899 Other long term (current) drug therapy: Secondary | ICD-10-CM | POA: Insufficient documentation

## 2010-10-07 DIAGNOSIS — M545 Low back pain, unspecified: Secondary | ICD-10-CM | POA: Insufficient documentation

## 2010-10-07 DIAGNOSIS — F411 Generalized anxiety disorder: Secondary | ICD-10-CM | POA: Insufficient documentation

## 2010-10-07 DIAGNOSIS — G8929 Other chronic pain: Secondary | ICD-10-CM | POA: Insufficient documentation

## 2010-10-07 DIAGNOSIS — Z9889 Other specified postprocedural states: Secondary | ICD-10-CM | POA: Insufficient documentation

## 2010-10-07 DIAGNOSIS — R42 Dizziness and giddiness: Secondary | ICD-10-CM | POA: Insufficient documentation

## 2010-10-07 DIAGNOSIS — Z8673 Personal history of transient ischemic attack (TIA), and cerebral infarction without residual deficits: Secondary | ICD-10-CM | POA: Insufficient documentation

## 2010-10-07 DIAGNOSIS — J329 Chronic sinusitis, unspecified: Secondary | ICD-10-CM | POA: Insufficient documentation

## 2010-10-07 DIAGNOSIS — I2510 Atherosclerotic heart disease of native coronary artery without angina pectoris: Secondary | ICD-10-CM | POA: Insufficient documentation

## 2010-10-07 DIAGNOSIS — I10 Essential (primary) hypertension: Secondary | ICD-10-CM | POA: Insufficient documentation

## 2010-10-07 DIAGNOSIS — M25559 Pain in unspecified hip: Secondary | ICD-10-CM | POA: Insufficient documentation

## 2010-10-07 DIAGNOSIS — K219 Gastro-esophageal reflux disease without esophagitis: Secondary | ICD-10-CM | POA: Insufficient documentation

## 2010-10-07 DIAGNOSIS — J3489 Other specified disorders of nose and nasal sinuses: Secondary | ICD-10-CM | POA: Insufficient documentation

## 2010-12-17 ENCOUNTER — Emergency Department (HOSPITAL_COMMUNITY)
Admission: EM | Admit: 2010-12-17 | Discharge: 2010-12-18 | Disposition: A | Discharge status: Home / Self Care | Payer: Medicare Other | Attending: Emergency Medicine | Admitting: Emergency Medicine

## 2010-12-17 ENCOUNTER — Emergency Department (HOSPITAL_COMMUNITY): Payer: Medicare Other

## 2010-12-17 DIAGNOSIS — F411 Generalized anxiety disorder: Secondary | ICD-10-CM | POA: Insufficient documentation

## 2010-12-17 DIAGNOSIS — R0602 Shortness of breath: Secondary | ICD-10-CM | POA: Insufficient documentation

## 2010-12-17 DIAGNOSIS — R42 Dizziness and giddiness: Secondary | ICD-10-CM | POA: Insufficient documentation

## 2010-12-17 DIAGNOSIS — I10 Essential (primary) hypertension: Secondary | ICD-10-CM | POA: Insufficient documentation

## 2010-12-17 DIAGNOSIS — Z8673 Personal history of transient ischemic attack (TIA), and cerebral infarction without residual deficits: Secondary | ICD-10-CM | POA: Insufficient documentation

## 2010-12-17 DIAGNOSIS — R002 Palpitations: Secondary | ICD-10-CM | POA: Insufficient documentation

## 2010-12-17 DIAGNOSIS — K219 Gastro-esophageal reflux disease without esophagitis: Secondary | ICD-10-CM | POA: Insufficient documentation

## 2010-12-17 DIAGNOSIS — I2510 Atherosclerotic heart disease of native coronary artery without angina pectoris: Secondary | ICD-10-CM | POA: Insufficient documentation

## 2010-12-17 LAB — POCT I-STAT, CHEM 8
BUN: 12 mg/dL (ref 6–23)
Creatinine, Ser: 1.1 mg/dL (ref 0.50–1.35)
Hemoglobin: 13.3 g/dL (ref 13.0–17.0)
Potassium: 3.6 mEq/L (ref 3.5–5.1)
Sodium: 142 mEq/L (ref 135–145)

## 2010-12-18 LAB — CK TOTAL AND CKMB (NOT AT ARMC)
CK, MB: 2.4 ng/mL (ref 0.3–4.0)
Relative Index: 1.6 (ref 0.0–2.5)

## 2011-01-20 LAB — POCT CARDIAC MARKERS: Myoglobin, poc: 69.6

## 2011-02-15 ENCOUNTER — Encounter (HOSPITAL_COMMUNITY): Payer: Self-pay | Admitting: Emergency Medicine

## 2011-02-15 ENCOUNTER — Emergency Department (HOSPITAL_COMMUNITY)
Admission: EM | Admit: 2011-02-15 | Discharge: 2011-02-15 | Discharge status: Against Medical Advice | Payer: Medicare Other | Attending: Emergency Medicine | Admitting: Emergency Medicine

## 2011-02-15 DIAGNOSIS — M549 Dorsalgia, unspecified: Secondary | ICD-10-CM | POA: Insufficient documentation

## 2011-02-15 DIAGNOSIS — M79609 Pain in unspecified limb: Secondary | ICD-10-CM | POA: Insufficient documentation

## 2011-02-15 NOTE — ED Notes (Signed)
Pt here for pain in left great toe and lower back pain into right hip x 3 weeks; pt sts hx of injury to back in past; pt denies new injury

## 2011-02-15 NOTE — ED Notes (Signed)
Pt called x 3  No answer. 

## 2011-02-17 ENCOUNTER — Encounter (HOSPITAL_COMMUNITY): Payer: Self-pay | Admitting: Emergency Medicine

## 2011-02-17 ENCOUNTER — Emergency Department (HOSPITAL_COMMUNITY)
Admission: EM | Admit: 2011-02-17 | Discharge: 2011-02-18 | Disposition: A | Discharge status: Home / Self Care | Payer: Medicare Other | Attending: Emergency Medicine | Admitting: Emergency Medicine

## 2011-02-17 DIAGNOSIS — I10 Essential (primary) hypertension: Secondary | ICD-10-CM | POA: Insufficient documentation

## 2011-02-17 DIAGNOSIS — M545 Low back pain, unspecified: Secondary | ICD-10-CM | POA: Insufficient documentation

## 2011-02-17 DIAGNOSIS — I2510 Atherosclerotic heart disease of native coronary artery without angina pectoris: Secondary | ICD-10-CM | POA: Insufficient documentation

## 2011-02-17 DIAGNOSIS — M549 Dorsalgia, unspecified: Secondary | ICD-10-CM

## 2011-02-17 DIAGNOSIS — M25559 Pain in unspecified hip: Secondary | ICD-10-CM | POA: Insufficient documentation

## 2011-02-17 DIAGNOSIS — E7800 Pure hypercholesterolemia, unspecified: Secondary | ICD-10-CM | POA: Insufficient documentation

## 2011-02-17 HISTORY — DX: Pure hypercholesterolemia, unspecified: E78.00

## 2011-02-17 HISTORY — DX: Essential (primary) hypertension: I10

## 2011-02-17 LAB — GLUCOSE, CAPILLARY

## 2011-02-17 MED ORDER — OXYCODONE-ACETAMINOPHEN 5-325 MG PO TABS
2.0000 | ORAL_TABLET | Freq: Once | ORAL | Status: AC
  Administered 2011-02-17: 2 via ORAL
  Filled 2011-02-17: qty 2

## 2011-02-17 NOTE — ED Notes (Signed)
Pt presents to department for evaluation of R lower back pain radiating to hip. Pt states chronic back pain, but states pain has increased recently. Describes pain as intermittent and sharp. 5/10 at the time. CMS intact. Ambulatory without difficulty. No bowel/bladder issues. Pt conscious alert and oriented x4. No signs of distress at the time.

## 2011-02-17 NOTE — ED Notes (Signed)
PT. REPORTS PROGRESSING RIGHT LOW BACK PAIN /RIGHT HIP PAIN X SEVERAL DAYS , DENIES INJURY OR FALL. AMBULATORY.

## 2011-02-18 MED ORDER — OXYCODONE-ACETAMINOPHEN 5-325 MG PO TABS: ORAL_TABLET | ORAL | Status: DC

## 2011-02-18 NOTE — ED Notes (Signed)
D/c home with instructions and perscription, verbalizes understanding, no questions

## 2011-02-18 NOTE — ED Provider Notes (Signed)
History     CSN: [id number] Arrival date & time: 02/17/2011  7:48 PM   First MD Initiated Contact with Patient 02/17/11 2324      Chief Complaint  Patient presents with  . Back Pain    (Consider location/radiation/quality/duration/timing/severity/associated sxs/prior treatment) Patient is a 65 y.o. male presenting with back pain.  Back Pain    Patient is a 65 year old male who presents today complaining of his typical right hip and lower back pain. Patient has history of surgical intervention in the right hip. This was performed following accident in 1995. Patient has plans to see a primary care physician. He is actually seeing his cardiologist Dr. Sharyn Lull tomorrow. The patient is presented to the ED to be seen for this one time previously and had to leave. Patient was never seen by a provider at that point. Patient denies any urinary or fecal incontinence. He denies any saddle anesthesia.  He has no history of cancer or long-term steroid use. The patient was taking over-the-counter medications for symptoms but says these have not been working. When asked if he is ever taken muscle relaxers he said he had a strange reaction to them once and so he no longer takes them. Patient does state that he thinks his back pain is worse recently as he no longer has a car and has had to walk a lot more recently. Between this and the change in the weather he thinks that he is having exacerbation of his chronic symptoms. There are no other associated or modifying factors. Past Medical History  Diagnosis Date  . Coronary artery disease   . Hypertension   . Hypercholesterolemia     Past Surgical History  Procedure Date  . Back surgery   . Hip arthroplasty     Family History  Problem Relation Age of Onset  . Diabetes Mother   . Diabetes Father     History  Substance Use Topics  . Smoking status: Current Everyday Smoker  . Smokeless tobacco: Not on file  . Alcohol Use: Yes      Review of  Systems  Constitutional: Negative.   HENT: Negative.   Eyes: Negative.   Respiratory: Negative.   Cardiovascular: Negative.   Gastrointestinal: Negative.   Genitourinary: Negative.   Musculoskeletal: Positive for back pain.       Chronic right hip pain  Skin: Negative.   Neurological: Negative.   Hematological: Negative.   Psychiatric/Behavioral: Negative.   All other systems reviewed and are negative.    Allergies  Review of patient's allergies indicates no known allergies.  Home Medications   Current Outpatient Rx  Name Route Sig Dispense Refill  . ASPIRIN 81 MG PO TABS Oral Take 81 mg by mouth daily.      Marland Kitchen CARVEDILOL 3.125 MG PO TABS Oral Take 3.125 mg by mouth 2 (two) times daily.      Marland Kitchen LISINOPRIL 10 MG PO TABS Oral Take 10 mg by mouth daily.      . ACETAMINOPHEN 500 MG PO TABS Oral Take 1,000-1,500 mg by mouth every 6 (six) hours as needed. For pain     . OXYCODONE-ACETAMINOPHEN 5-325 MG PO TABS  Take 1-2 tabs by mouth every 6 hours as needed for pain 30 tablet 0    BP 167/88  Pulse 60  Temp(Src) 97.4 F (36.3 C) (Oral)  Resp 18  SpO2 98%  Physical Exam  Nursing note and vitals reviewed. Constitutional: He is oriented to person, place, and time. He appears  well-developed and well-nourished. No distress.  HENT:  Head: Normocephalic and atraumatic.  Eyes: Conjunctivae and EOM are normal. Pupils are equal, round, and reactive to light.  Neck: Normal range of motion.  Cardiovascular: Normal rate, regular rhythm, normal heart sounds and intact distal pulses.  Exam reveals no gallop and no friction rub.   No murmur heard. Pulmonary/Chest: Effort normal and breath sounds normal. No respiratory distress. He has no wheezes. He has no rales.  Abdominal: Soft. Bowel sounds are normal. He exhibits no distension. There is no tenderness. There is no rebound and no guarding.  Musculoskeletal:       The patient has tenderness to palpation over the right hip and right lumbar  paraspinal muscles  Neurological: He is alert and oriented to person, place, and time. No cranial nerve deficit. He exhibits normal muscle tone. Coordination normal.  Skin: Skin is warm and dry. No rash noted. No erythema.  Psychiatric: He has a normal mood and affect.    ED Course  Procedures (including critical care time)  Labs Reviewed  GLUCOSE, CAPILLARY - Abnormal; Notable for the following:    Glucose-Capillary 165 (*)    All other components within normal limits  POCT CBG MONITORING   No results found.   1. Back pain       MDM  Patient was examined by myself. He had no concerning or red flag symptoms for back pain. Patient had history consistent with exacerbation of prior symptoms and no concerning neurologic findings. Patient was given 2 tabs Percocet by mouth. He is given a prescription for this and advised to obtain a primary care doctor as we would not be providing this on a regular basis. He declined muscle relaxants and was already taking over-the-counter medication. Patient was felt safe for discharge home and was discharged home in good condition        Cyndra Numbers, MD 02/18/11 [phone]

## 2011-03-26 ENCOUNTER — Emergency Department (HOSPITAL_COMMUNITY)
Admission: EM | Admit: 2011-03-26 | Discharge: 2011-03-26 | Disposition: A | Discharge status: Home / Self Care | Payer: Medicare Other | Attending: Emergency Medicine | Admitting: Emergency Medicine

## 2011-03-26 ENCOUNTER — Encounter (HOSPITAL_COMMUNITY): Payer: Self-pay | Admitting: *Deleted

## 2011-03-26 DIAGNOSIS — M25559 Pain in unspecified hip: Secondary | ICD-10-CM | POA: Insufficient documentation

## 2011-03-26 DIAGNOSIS — E7800 Pure hypercholesterolemia, unspecified: Secondary | ICD-10-CM | POA: Insufficient documentation

## 2011-03-26 DIAGNOSIS — M549 Dorsalgia, unspecified: Secondary | ICD-10-CM | POA: Insufficient documentation

## 2011-03-26 DIAGNOSIS — F17200 Nicotine dependence, unspecified, uncomplicated: Secondary | ICD-10-CM | POA: Insufficient documentation

## 2011-03-26 DIAGNOSIS — Z7982 Long term (current) use of aspirin: Secondary | ICD-10-CM | POA: Insufficient documentation

## 2011-03-26 DIAGNOSIS — Z79899 Other long term (current) drug therapy: Secondary | ICD-10-CM | POA: Insufficient documentation

## 2011-03-26 DIAGNOSIS — I2510 Atherosclerotic heart disease of native coronary artery without angina pectoris: Secondary | ICD-10-CM | POA: Insufficient documentation

## 2011-03-26 DIAGNOSIS — R269 Unspecified abnormalities of gait and mobility: Secondary | ICD-10-CM | POA: Insufficient documentation

## 2011-03-26 DIAGNOSIS — M25551 Pain in right hip: Secondary | ICD-10-CM

## 2011-03-26 DIAGNOSIS — G8929 Other chronic pain: Secondary | ICD-10-CM | POA: Insufficient documentation

## 2011-03-26 DIAGNOSIS — I10 Essential (primary) hypertension: Secondary | ICD-10-CM | POA: Insufficient documentation

## 2011-03-26 MED ORDER — HYDROCODONE-ACETAMINOPHEN 5-325 MG PO TABS: 1.0000 | ORAL_TABLET | ORAL | Status: AC | PRN

## 2011-03-26 NOTE — ED Notes (Signed)
Reports lower back pain and right hip pain, was here in past for same.

## 2011-03-26 NOTE — ED Notes (Signed)
Back and hip pain x 2 weeks sharp throbbing pain

## 2011-03-26 NOTE — ED Provider Notes (Signed)
History     CSN: [id number] Arrival date & time: 03/26/2011 12:22 PM   First MD Initiated Contact with Patient 03/26/11 1224      Chief Complaint  Patient presents with  . Back Pain  . Hip Pain    (Consider location/radiation/quality/duration/timing/severity/associated sxs/prior treatment) HPI Comments: Patient comes in complaining of chronic right hip pain.  It is not significantly better or worse than his normal pain that he's had for over 15 years since an initial accident work with multiple following surgeries.  He is to follow with an orthopedic physician in [facility] but has since stopped following with Dr.  Over the last few weeks or months patient started noticing that his left leg will begin to "give out".  He did not fall.  He has not hit his knee.  He just notes that his left leg will begin to go lax.  He is not lightheaded or dizzy and does not have syncopal episodes.  He does not have chest pain, shortness of breath or palpitations with this.  Patient presents today for evaluation.  He notes that he does not have a primary care physician but he does see Dr. Sharyn Lull.    Patient is a 65 y.o. male presenting with back pain and hip pain. The history is provided by the patient. No language interpreter was used.  Back Pain  This is a chronic problem. Pertinent negatives include no chest pain, no fever, no headaches and no abdominal pain.  Hip Pain Pertinent negatives include no chest pain, no abdominal pain, no headaches and no shortness of breath.    Past Medical History  Diagnosis Date  . Coronary artery disease   . Hypertension   . Hypercholesterolemia     Past Surgical History  Procedure Date  . Back surgery   . Hip arthroplasty     Family History  Problem Relation Age of Onset  . Diabetes Mother   . Diabetes Father     History  Substance Use Topics  . Smoking status: Current Everyday Smoker  . Smokeless tobacco: Not on file  . Alcohol Use: Yes       Review of Systems  Constitutional: Negative.  Negative for fever and chills.  HENT: Negative.   Eyes: Negative.  Negative for discharge and redness.  Respiratory: Negative.  Negative for cough and shortness of breath.   Cardiovascular: Negative.  Negative for chest pain.  Gastrointestinal: Negative.  Negative for nausea, vomiting and abdominal pain.  Genitourinary: Negative.  Negative for hematuria.  Musculoskeletal: Positive for gait problem. Negative for back pain.  Skin: Negative.  Negative for color change and rash.  Neurological: Negative for syncope and headaches.  Hematological: Negative.  Negative for adenopathy.  Psychiatric/Behavioral: Negative.  Negative for confusion.  All other systems reviewed and are negative.    Allergies  Review of patient's allergies indicates no known allergies.  Home Medications   Current Outpatient Rx  Name Route Sig Dispense Refill  . ASPIRIN EC 81 MG PO TBEC Oral Take 81 mg by mouth daily.      Marland Kitchen LISINOPRIL 10 MG PO TABS Oral Take 10 mg by mouth daily.      . OXYCODONE-ACETAMINOPHEN 5-325 MG PO TABS Oral Take 1-2 tablets by mouth every 6 (six) hours as needed. For pain       BP 150/88  Pulse 88  Temp(Src) 98.1 F (36.7 C) (Oral)  SpO2 98%  Physical Exam  Constitutional: He is oriented to person, place, and  time. He appears well-developed and well-nourished.  HENT:  Head: Normocephalic and atraumatic.  Eyes: Conjunctivae and EOM are normal. Pupils are equal, round, and reactive to light.  Neck: Normal range of motion. Neck supple.  Pulmonary/Chest: Effort normal.  Musculoskeletal: Normal range of motion. He exhibits no edema and no tenderness.       Patient is at able to ambulate through the emergency department without difficulty.  There is no swelling to his left knee, no tenderness no redness and no warmth.  Left hip has no tenderness to palpation.  Neurological: He is alert and oriented to person, place, and time.   Skin: Skin is warm and dry. No erythema.  Psychiatric: He has a normal mood and affect. His behavior is normal. Judgment and thought content normal.    ED Course  Procedures (including critical care time)  Labs Reviewed - No data to display No results found.   No diagnosis found.    MDM  Patient with no acute injury to his right or left hip today.  He has chronic right hip pain which has been ongoing for over 15 years.  Patient has no focal left hip pain but notes that occasionally his left knee we'll begin to get out.  He did not fall.  He is able to ambulate normally here in the emergency department.  I have highly encouraged this patient to followup with an orthopedic physician for continued followup.  I have reiterated with him that we cannot chronically supply him with pain medications and he understands that at this time.        Nat Christen, MD 03/26/11 [phone]

## 2011-11-01 ENCOUNTER — Emergency Department (HOSPITAL_COMMUNITY): Payer: Medicare Other

## 2011-11-01 ENCOUNTER — Emergency Department (HOSPITAL_COMMUNITY)
Admission: EM | Admit: 2011-11-01 | Discharge: 2011-11-01 | Disposition: A | Discharge status: Home / Self Care | Payer: Medicare Other | Attending: Emergency Medicine | Admitting: Emergency Medicine

## 2011-11-01 ENCOUNTER — Encounter (HOSPITAL_COMMUNITY): Payer: Self-pay | Admitting: Emergency Medicine

## 2011-11-01 DIAGNOSIS — R109 Unspecified abdominal pain: Secondary | ICD-10-CM

## 2011-11-01 DIAGNOSIS — M545 Low back pain, unspecified: Secondary | ICD-10-CM | POA: Insufficient documentation

## 2011-11-01 DIAGNOSIS — I2510 Atherosclerotic heart disease of native coronary artery without angina pectoris: Secondary | ICD-10-CM | POA: Insufficient documentation

## 2011-11-01 DIAGNOSIS — Z79899 Other long term (current) drug therapy: Secondary | ICD-10-CM | POA: Insufficient documentation

## 2011-11-01 DIAGNOSIS — F17200 Nicotine dependence, unspecified, uncomplicated: Secondary | ICD-10-CM | POA: Insufficient documentation

## 2011-11-01 DIAGNOSIS — I10 Essential (primary) hypertension: Secondary | ICD-10-CM | POA: Insufficient documentation

## 2011-11-01 DIAGNOSIS — E785 Hyperlipidemia, unspecified: Secondary | ICD-10-CM | POA: Insufficient documentation

## 2011-11-01 LAB — URINALYSIS, ROUTINE W REFLEX MICROSCOPIC
Leukocytes, UA: NEGATIVE
Nitrite: NEGATIVE
Specific Gravity, Urine: 1.009 (ref 1.005–1.030)
pH: 6 (ref 5.0–8.0)

## 2011-11-01 MED ORDER — OXYCODONE-ACETAMINOPHEN 5-325 MG PO TABS: 1.0000 | ORAL_TABLET | Freq: Four times a day (QID) | ORAL | Status: AC | PRN

## 2011-11-01 NOTE — ED Provider Notes (Signed)
History   This chart was scribed for Geoffery Lyons, MD by Toya Smothers. The patient was seen in room TR07C/TR07C. Patient's care was started at 1258.  CSN: [id number]  Arrival date & time 11/01/11  1258   First MD Initiated Contact with Patient 11/01/11 1508      Chief Complaint  Patient presents with  . Back Pain   HPI  Bruce [name] is a 66 y.o. male who presents to the Emergency Department complaining of gradual onset moderate worsening lower back pain onset 3 weeks ago. Pain is described as waxing and waning, aching, and agrexated with movement. Pt denies constipation, bowel irregularity, dysuria, and gaiting difficulty. Pt denies injury, though he lists a h/o back surgery and hip arthoplasty.   Past Medical History  Diagnosis Date  . Coronary artery disease   . Hypertension   . Hypercholesterolemia     Past Surgical History  Procedure Date  . Back surgery   . Hip arthroplasty     Family History  Problem Relation Age of Onset  . Diabetes Mother   . Diabetes Father     History  Substance Use Topics  . Smoking status: Current Everyday Smoker  . Smokeless tobacco: Not on file  . Alcohol Use: Yes    Review of Systems  Allergies  Review of patient's allergies indicates no known allergies.  Home Medications   Current Outpatient Rx  Name Route Sig Dispense Refill  . ALPRAZOLAM 0.5 MG PO TABS Oral Take 0.5 mg by mouth 2 (two) times daily.    . ASPIRIN EC 81 MG PO TBEC Oral Take 81 mg by mouth daily.      Marland Kitchen CARVEDILOL PO Oral Take 1 tablet by mouth daily.    Marland Kitchen LISINOPRIL 10 MG PO TABS Oral Take 10 mg by mouth daily.      . OXYCODONE-ACETAMINOPHEN 5-325 MG PO TABS Oral Take 1-2 tablets by mouth every 6 (six) hours as needed. For pain       BP 154/74  Pulse 76  Temp 98.5 F (36.9 C) (Oral)  Resp 20  SpO2 95%  Physical Exam  Nursing note and vitals reviewed. Constitutional: He is oriented to person, place, and time. He appears well-developed and  well-nourished. No distress.  HENT:  Head: Normocephalic and atraumatic.  Eyes: EOM are normal. Pupils are equal, round, and reactive to light.  Neck: Neck supple. No tracheal deviation present.  Cardiovascular: Normal rate.   Pulmonary/Chest: Effort normal. No respiratory distress.  Abdominal: Soft. He exhibits no distension. There is no tenderness.  Musculoskeletal: Normal range of motion. He exhibits no edema.       Tender to palpation in L lumbar region. No bony tenderness. No step off.  Neurological: He is alert and oriented to person, place, and time. No sensory deficit.  Skin: Skin is warm and dry.  Psychiatric: He has a normal mood and affect. His behavior is normal.    ED Course  Procedures (including critical care time) DIAGNOSTIC STUDIES: Oxygen Saturation is 95% on room air, adequate by my interpretation.    COORDINATION OF CARE: 1522- Will check UA and order lumbar spine film.  Labs Reviewed  GLUCOSE, CAPILLARY - Abnormal; Notable for the following:    Glucose-Capillary 109 (*)     All other components within normal limits  URINALYSIS, ROUTINE W REFLEX MICROSCOPIC   Dg Lumbar Spine Complete  11/01/2011  *RADIOLOGY REPORT*  Clinical Data: Left-sided back pain.  LUMBAR SPINE - COMPLETE 4+ VIEW  Comparison: None.  Findings: Four views study shows no fracture.  No subluxation.  The facets are well-aligned bilaterally.  SI joints are normal.  Coarse dystrophic calcification overlies the L1 vertebral body and may be pancreatic calcification related to chronic pancreatitis.  IMPRESSION: No acute findings in the lumbar spine.  Original Report Authenticated By: ERIC A. MANSELL, M.D.     No diagnosis found.    MDM  The patient presents with pain in the back and side that seems musculoskeletal in nature.  Xrays and urinalysis look okay.  He is feeling better and will discharged with the diagnosis of flank pain.  I doubt any life threatening or emergent process.  He will  return if he worsens.    I personally performed the services described in this documentation, which was scribed in my presence. The recorded information has been reviewed and considered.      Geoffery Lyons, MD 11/01/11 [phone]

## 2011-11-01 NOTE — ED Notes (Signed)
Patient stated did not eat since 0530 - 0600.

## 2011-11-01 NOTE — ED Notes (Signed)
No answer x1

## 2011-11-01 NOTE — ED Notes (Signed)
Left sided back pain that goes to groin x 3 weeks he states

## 2011-11-01 NOTE — ED Notes (Signed)
No new injury had a back brace and lent it to a friend and did not get it back

## 2011-11-01 NOTE — ED Notes (Signed)
Patient urinated and forgot that he needed to provide a urine sample. Will attempt shortly. EDP notified.

## 2012-09-06 ENCOUNTER — Encounter (HOSPITAL_COMMUNITY): Payer: Self-pay | Admitting: Physical Medicine and Rehabilitation

## 2012-09-06 ENCOUNTER — Emergency Department (HOSPITAL_COMMUNITY)
Admission: EM | Admit: 2012-09-06 | Discharge: 2012-09-06 | Disposition: A | Discharge status: Home / Self Care | Payer: PRIVATE HEALTH INSURANCE | Attending: Emergency Medicine | Admitting: Emergency Medicine

## 2012-09-06 ENCOUNTER — Emergency Department (HOSPITAL_COMMUNITY): Payer: PRIVATE HEALTH INSURANCE

## 2012-09-06 DIAGNOSIS — I2510 Atherosclerotic heart disease of native coronary artery without angina pectoris: Secondary | ICD-10-CM | POA: Insufficient documentation

## 2012-09-06 DIAGNOSIS — G8929 Other chronic pain: Secondary | ICD-10-CM

## 2012-09-06 DIAGNOSIS — M549 Dorsalgia, unspecified: Secondary | ICD-10-CM | POA: Insufficient documentation

## 2012-09-06 DIAGNOSIS — R269 Unspecified abnormalities of gait and mobility: Secondary | ICD-10-CM | POA: Insufficient documentation

## 2012-09-06 DIAGNOSIS — Z79899 Other long term (current) drug therapy: Secondary | ICD-10-CM | POA: Insufficient documentation

## 2012-09-06 DIAGNOSIS — E7800 Pure hypercholesterolemia, unspecified: Secondary | ICD-10-CM | POA: Insufficient documentation

## 2012-09-06 DIAGNOSIS — M25559 Pain in unspecified hip: Secondary | ICD-10-CM | POA: Insufficient documentation

## 2012-09-06 DIAGNOSIS — I10 Essential (primary) hypertension: Secondary | ICD-10-CM | POA: Insufficient documentation

## 2012-09-06 DIAGNOSIS — F17200 Nicotine dependence, unspecified, uncomplicated: Secondary | ICD-10-CM | POA: Insufficient documentation

## 2012-09-06 MED ORDER — OXYCODONE-ACETAMINOPHEN 10-325 MG PO TABS: 1.0000 | ORAL_TABLET | ORAL | Status: DC | PRN

## 2012-09-06 NOTE — ED Notes (Signed)
Pt presents to department for evaluation of R hip and back pain. Ongoing for several days. 10/10 pain at the time. Ambulatory to triage. Pt is alert and oriented x4. No signs of acute distress noted.

## 2012-09-06 NOTE — ED Notes (Signed)
Pt ambulatory to room. Pt states ambulating sometimes helps his pain. Pt states he usually takes pain medication but does not have any more at this time and has not been to see his doctor.

## 2012-09-06 NOTE — ED Provider Notes (Signed)
History     CSN: [id number]  Arrival date & time 09/06/12  1220   First MD Initiated Contact with Patient 09/06/12 1324      Chief Complaint  Patient presents with  . Hip Pain  . Back Pain    (Consider location/radiation/quality/duration/timing/severity/associated sxs/prior treatment) HPI Bruce [name] is a 67 y.o. male who presents to ED with complaint of right hip pain. States had surgery on it in 1990s, states "had rod put in." reports recently in the last few months intermittent sharp pain that is in the right lower back and in the right hip. States pain worse with walking and moving. No abdominal pain. No loss of bowels or bladder function. No weakness or numbness in legs. States normally takes percocet for pain but ran out. States "i just want to make sure i dont have gangrene in it." Denies fever, chills, malaise or any new injuries.      Past Medical History  Diagnosis Date  . Coronary artery disease   . Hypertension   . Hypercholesterolemia     Past Surgical History  Procedure Laterality Date  . Back surgery    . Hip arthroplasty      Family History  Problem Relation Age of Onset  . Diabetes Mother   . Diabetes Father     History  Substance Use Topics  . Smoking status: Current Every Day Smoker    Types: Cigarettes  . Smokeless tobacco: Not on file  . Alcohol Use: Yes      Review of Systems  Constitutional: Negative for fever and chills.  Respiratory: Negative.   Cardiovascular: Negative.   Genitourinary: Negative for dysuria and hematuria.  Musculoskeletal: Positive for back pain, arthralgias and gait problem.  Neurological: Negative for weakness and numbness.    Allergies  Review of patient's allergies indicates no known allergies.  Home Medications   Current Outpatient Rx  Name  Route  Sig  Dispense  Refill  . ALPRAZolam (XANAX) 1 MG tablet   Oral   Take 1 mg by mouth 2 (two) times daily as needed for sleep or anxiety.         .  hydrochlorothiazide (HYDRODIURIL) 25 MG tablet   Oral   Take 25 mg by mouth daily.         . metoprolol (LOPRESSOR) 50 MG tablet   Oral   Take 50 mg by mouth 2 (two) times daily.         Marland Kitchen oxyCODONE-acetaminophen (PERCOCET) 10-325 MG per tablet   Oral   Take 1 tablet by mouth every 8 (eight) hours as needed for pain.           BP 179/98  Pulse 58  Temp(Src) 97.6 F (36.4 C) (Oral)  Resp 18  SpO2 99%  Physical Exam  Nursing note and vitals reviewed. Constitutional: He appears well-developed and well-nourished. No distress.  Cardiovascular: Normal rate, regular rhythm and normal heart sounds.   Pulmonary/Chest: Effort normal and breath sounds normal. No respiratory distress. He has no wheezes. He has no rales.  Musculoskeletal: He exhibits no edema.  No midline lumbar spine tenderness. Large old surgical incision scar over right hip joint. Tender to palpation over right SI joint and right greater trochanter. Pain with flexion, extension, internal and external rotation of the right hip. Normal dorsal pedal pulses bilaterally   Neurological: He is alert.  Skin: Skin is warm and dry.    ED Course  Procedures (including critical care time)  Results for  orders placed during the hospital encounter of 11/01/11  URINALYSIS, ROUTINE W REFLEX MICROSCOPIC      Result Value Range   Color, Urine YELLOW  YELLOW   APPearance CLEAR  CLEAR   Specific Gravity, Urine 1.009  1.005 - 1.030   pH 6.0  5.0 - 8.0   Glucose, UA NEGATIVE  NEGATIVE mg/dL   Hgb urine dipstick NEGATIVE  NEGATIVE   Bilirubin Urine NEGATIVE  NEGATIVE   Ketones, ur NEGATIVE  NEGATIVE mg/dL   Protein, ur NEGATIVE  NEGATIVE mg/dL   Urobilinogen, UA 0.2  0.0 - 1.0 mg/dL   Nitrite NEGATIVE  NEGATIVE   Leukocytes, UA NEGATIVE  NEGATIVE  GLUCOSE, CAPILLARY      Result Value Range   Glucose-Capillary 109 (*) 70 - 99 mg/dL   Dg Hip Complete Right  09/06/2012   *RADIOLOGY REPORT*  Clinical Data: Increased hip pain  laterally, remote surgery  RIGHT HIP - COMPLETE 2+ VIEW  Comparison: 12/22/2006, 09/23/2006  Findings: IM nail in proximal right femur. Symmetric hip and SI joints. Osseous mineralization normal. Post-traumatic changes of the right femoral diaphysis noted. Soft tissue calcification seen adjacent to the right hip, question heterotopic ossification versus myositis ossificans, unchanged. No acute fracture or dislocation. Lucency is identified at the medial aspect of the proximal to mid right femoral diaphysis unchanged since 09/23/2006. No definite acute findings identified.  IMPRESSION: Post-traumatic and postsurgical changes of right femur and hip region. No acute abnormalities.   Original Report Authenticated By: Ulyses Southward, M.D.      1. Chronic right hip pain       MDM  Pt with chronic right hip pain.  No new injuries. No neuro deficits. Pt is in no distress. No fever. Ambulatory. Discussed with Dr. Silverio Lay. X-rays negative other than post surgical changes. Pt is followed by orthopedist in [place]. Will d/c home with follow up as soon as able. Pt states he ran out of percocets at home, takes 10mg . Will give 15 tabs. Also mentioned elevated BP in ED. Pt states recently had his PCP change his BP medications, supposed to go be rechecked next week. Will follow up.   Filed Vitals:   09/06/12 1225 09/06/12 1509  BP: 179/98 163/94  Pulse: 58 53  Temp: 97.6 F (36.4 C)   TempSrc: Oral   Resp: 18 18  SpO2: 99% 99%          Myriam Jacobson Silvano Garofano, PA-C 09/06/12 1559

## 2012-09-06 NOTE — ED Notes (Signed)
Pt returned from radiology.

## 2012-09-08 NOTE — ED Provider Notes (Signed)
Medical screening examination/treatment/procedure(s) were conducted as a shared visit with non-physician practitioner(s) and myself.  I personally evaluated the patient during the encounter  Bruce [name] is a 68 y.o. male hx of R hip replacement at [facility] [place] here with worsening chronic R  Hip pain. No recent falls or injury. Hip nl ROM and nl spinal tenderness. Neurovascular exam unremarkable. Xray showed no fracture but some postsurgical changes. Will give him short course of percocet as he ran out of it. He will f/u with ortho at [facility].    Richardean Canal, MD 09/08/12 [phone]

## 2012-10-03 ENCOUNTER — Emergency Department (HOSPITAL_COMMUNITY)
Admission: EM | Admit: 2012-10-03 | Discharge: 2012-10-03 | Disposition: A | Discharge status: Home / Self Care | Payer: PRIVATE HEALTH INSURANCE | Attending: Emergency Medicine | Admitting: Emergency Medicine

## 2012-10-03 ENCOUNTER — Encounter (HOSPITAL_COMMUNITY): Payer: Self-pay | Admitting: *Deleted

## 2012-10-03 DIAGNOSIS — I10 Essential (primary) hypertension: Secondary | ICD-10-CM | POA: Insufficient documentation

## 2012-10-03 DIAGNOSIS — I2510 Atherosclerotic heart disease of native coronary artery without angina pectoris: Secondary | ICD-10-CM | POA: Insufficient documentation

## 2012-10-03 DIAGNOSIS — Z79899 Other long term (current) drug therapy: Secondary | ICD-10-CM | POA: Insufficient documentation

## 2012-10-03 DIAGNOSIS — G8929 Other chronic pain: Secondary | ICD-10-CM | POA: Insufficient documentation

## 2012-10-03 DIAGNOSIS — Z9889 Other specified postprocedural states: Secondary | ICD-10-CM | POA: Insufficient documentation

## 2012-10-03 DIAGNOSIS — Z8673 Personal history of transient ischemic attack (TIA), and cerebral infarction without residual deficits: Secondary | ICD-10-CM | POA: Insufficient documentation

## 2012-10-03 DIAGNOSIS — M2550 Pain in unspecified joint: Secondary | ICD-10-CM | POA: Insufficient documentation

## 2012-10-03 DIAGNOSIS — Z760 Encounter for issue of repeat prescription: Secondary | ICD-10-CM | POA: Insufficient documentation

## 2012-10-03 DIAGNOSIS — F17200 Nicotine dependence, unspecified, uncomplicated: Secondary | ICD-10-CM | POA: Insufficient documentation

## 2012-10-03 DIAGNOSIS — M545 Low back pain, unspecified: Secondary | ICD-10-CM | POA: Insufficient documentation

## 2012-10-03 DIAGNOSIS — Z862 Personal history of diseases of the blood and blood-forming organs and certain disorders involving the immune mechanism: Secondary | ICD-10-CM | POA: Insufficient documentation

## 2012-10-03 DIAGNOSIS — Z8639 Personal history of other endocrine, nutritional and metabolic disease: Secondary | ICD-10-CM | POA: Insufficient documentation

## 2012-10-03 HISTORY — DX: Cerebral infarction, unspecified: I63.9

## 2012-10-03 NOTE — ED Notes (Signed)
Pt is waiting to get appointment with primary md.  Pt is here with right lower back pain and hip pain from surgery and needs pain medication until he can get into to see his PMD.

## 2012-10-03 NOTE — ED Notes (Addendum)
"   I'll call my lawyer...you would give me medicine if I were a cracker". Pt very angry about not getting Rx for pain. Pt refused to sign discharge form and refused discharge papers.

## 2012-10-03 NOTE — ED Provider Notes (Signed)
History    This chart was scribed for a non-physician practitioner, Bruce Forth, PA-C, working with Bruce Cooper III, MD by Bruce [name], ED Scribe. This patient was seen in room TR06C/TR06C and the patient's care was started at 1734.    CSN: [id number] Arrival date & time 10/03/12  1459  First MD Initiated Contact with Patient 10/03/12 1734     No chief complaint on file.  (Consider location/radiation/quality/duration/timing/severity/associated sxs/prior Treatment) The history is provided by the patient and medical records. No language interpreter was used.    HPI Comments: Bruce [name] is a 67 y.o. male with a h/o of chronic back and hip pain and a surgical h/o of back surgery and hip arthroplasty who presents to the Emergency Department complaining of chronic back pain that requires a medication refill. He states that he has lived in the area for a year and needs his medications refilled until he can get established with a provider in the area. When asked about his medical history, he is unable to state any of his medical conditions or medications that he is currently taking. He is evasive when questioned about his pain and continues to state that he just needs his pain medications.  In ED, he denies any current symptoms that are worse than baseline.  Past Medical History  Diagnosis Date  . Coronary artery disease   . Hypertension   . Hypercholesterolemia   . Stroke    Past Surgical History  Procedure Laterality Date  . Back surgery    . Hip arthroplasty     Family History  Problem Relation Age of Onset  . Diabetes Mother   . Diabetes Father    History  Substance Use Topics  . Smoking status: Current Every Day Smoker    Types: Cigarettes  . Smokeless tobacco: Not on file  . Alcohol Use: Yes    Review of Systems  Constitutional: Negative for fever, diaphoresis, appetite change, fatigue and unexpected weight change.  HENT: Negative for mouth sores and neck  stiffness.   Eyes: Negative for visual disturbance.  Respiratory: Negative for cough, chest tightness, shortness of breath and wheezing.   Cardiovascular: Negative for chest pain.  Gastrointestinal: Negative for nausea, vomiting, abdominal pain, diarrhea and constipation.  Endocrine: Negative for polydipsia, polyphagia and polyuria.  Genitourinary: Negative for dysuria, urgency, frequency and hematuria.  Musculoskeletal: Positive for back pain and arthralgias.  Skin: Negative for rash.  Allergic/Immunologic: Negative for immunocompromised state.  Neurological: Negative for syncope, light-headedness and headaches.  Hematological: Does not bruise/bleed easily.  Psychiatric/Behavioral: Negative for sleep disturbance. The patient is not nervous/anxious.     Allergies  Review of patient's allergies indicates no known allergies.  Home Medications   Current Outpatient Rx  Name  Route  Sig  Dispense  Refill  . ALPRAZolam (XANAX) 1 MG tablet   Oral   Take 1 mg by mouth 2 (two) times daily as needed for sleep or anxiety.         . hydrochlorothiazide (HYDRODIURIL) 25 MG tablet   Oral   Take 25 mg by mouth daily.         . metoprolol (LOPRESSOR) 50 MG tablet   Oral   Take 50 mg by mouth 2 (two) times daily.         Marland Kitchen oxyCODONE-acetaminophen (PERCOCET) 10-325 MG per tablet   Oral   Take 1 tablet by mouth every 4 (four) hours as needed for pain.  BP 154/79  Pulse 89  Temp(Src) 98 F (36.7 C) (Oral)  Resp 18  SpO2 100% Physical Exam  Nursing note and vitals reviewed. Constitutional: He appears well-developed and well-nourished. No distress.  HENT:  Head: Normocephalic and atraumatic.  Mouth/Throat: Oropharynx is clear and moist. No oropharyngeal exudate.  Eyes: Conjunctivae are normal. No scleral icterus.  Neck: Normal range of motion. Neck supple.  Cardiovascular: Normal rate, regular rhythm, normal heart sounds and intact distal pulses.   No murmur  heard. Pulmonary/Chest: Effort normal and breath sounds normal. No respiratory distress. He has no wheezes.  Musculoskeletal: Normal range of motion. He exhibits no edema.  Ambulates without difficulty. No tenderness to palpation to the paraspinal muscles of the T-spine or L-spine.  No midline lumbar spine tenderness.   Pain with flexion, extension, internal and external rotation of the right hip with somewhat limited ROM 2/2 pain.    Neurological: He is alert. He exhibits normal muscle tone. Coordination normal.  Speech is clear and goal oriented Moves extremities without ataxia  Skin: Skin is warm and dry. He is not diaphoretic. No erythema.  Large well-healed surgical scar on the right hip.   Psychiatric: He has a normal mood and affect.    ED Course  Procedures (including critical care time)  DIAGNOSTIC STUDIES: Oxygen Saturation is 100% on room air, normal by my interpretation.    COORDINATION OF CARE:  18:03- Discussed planned course of treatment with the patient, including a resource guide to get established with a pain management provider and PCP, who is agreeable at this time.  Labs Reviewed - No data to display No results found. 1. Chronic pain     MDM  [name] presents with chronic back, hip and leg pain.  Pt states no change in his pain, no acute exacerbation and no intensification of the pain.  Pt requesting 1+ month supply of his percocet and xanex.  According to the [place] controlled substance database pt was given 30 percocet on 09/09/12 and 08/14/12 as well as 15 more on 5/28.  This record is also evident the patient is being prescribed medications on a regular basis by Dr. Quitman Livings.  Pt without neurologic deficit.   Patient can walk assisted with his cane but states it is painful to baseline.  No loss of bowel or bladder control.  No concern for cauda equina.  No fever, night sweats, weight loss, h/o cancer, IVDU. Pt states he does not just want medications but  wants more prescriptions.  He states he needs a PCP and orthopedist.  He has been informed that I will not write any further pain medications.  He has been given resources for pain management, PCP and orthopedics for further evaluation and pain management.     6:34 PM RN informs me that at discharge pt was hostile and refused all his paperwork including all resources provided.    Dahlia Client Lindsy Cerullo, PA-C 10/03/12 2350

## 2012-10-04 NOTE — ED Provider Notes (Signed)
Medical screening examination/treatment/procedure(s) were performed by non-physician practitioner and as supervising physician I was immediately available for consultation/collaboration.   Carleene Cooper III, MD 10/04/12 [phone]

## 2012-10-30 ENCOUNTER — Other Ambulatory Visit (HOSPITAL_COMMUNITY): Payer: Self-pay | Admitting: Family Medicine

## 2012-10-30 ENCOUNTER — Ambulatory Visit (HOSPITAL_COMMUNITY)
Admission: RE | Admit: 2012-10-30 | Discharge: 2012-10-30 | Disposition: A | Discharge status: Home / Self Care | Payer: PRIVATE HEALTH INSURANCE | Source: Ambulatory Visit | Attending: Family Medicine | Admitting: Family Medicine

## 2012-10-30 ENCOUNTER — Other Ambulatory Visit: Payer: Self-pay

## 2012-10-30 DIAGNOSIS — R05 Cough: Secondary | ICD-10-CM | POA: Insufficient documentation

## 2012-10-30 DIAGNOSIS — M79609 Pain in unspecified limb: Secondary | ICD-10-CM | POA: Insufficient documentation

## 2012-10-30 DIAGNOSIS — M6140 Other calcification of muscle, unspecified site: Secondary | ICD-10-CM | POA: Insufficient documentation

## 2012-10-30 DIAGNOSIS — R059 Cough, unspecified: Secondary | ICD-10-CM | POA: Insufficient documentation

## 2012-10-30 DIAGNOSIS — A699 Spirochetal infection, unspecified: Secondary | ICD-10-CM

## 2012-10-30 DIAGNOSIS — R52 Pain, unspecified: Secondary | ICD-10-CM

## 2012-10-30 DIAGNOSIS — M25559 Pain in unspecified hip: Secondary | ICD-10-CM | POA: Insufficient documentation

## 2012-10-30 DIAGNOSIS — M25569 Pain in unspecified knee: Secondary | ICD-10-CM | POA: Insufficient documentation

## 2012-10-30 DIAGNOSIS — Z87891 Personal history of nicotine dependence: Secondary | ICD-10-CM | POA: Insufficient documentation

## 2012-10-30 DIAGNOSIS — R9431 Abnormal electrocardiogram [ECG] [EKG]: Secondary | ICD-10-CM | POA: Insufficient documentation

## 2012-10-30 DIAGNOSIS — R0989 Other specified symptoms and signs involving the circulatory and respiratory systems: Secondary | ICD-10-CM | POA: Insufficient documentation

## 2012-10-30 DIAGNOSIS — R079 Chest pain, unspecified: Secondary | ICD-10-CM | POA: Insufficient documentation

## 2012-10-30 LAB — CBC
MCH: 30.9 pg (ref 26.0–34.0)
Platelets: 365 10*3/uL (ref 150–400)
RBC: 4.18 MIL/uL — ABNORMAL LOW (ref 4.22–5.81)
WBC: 9.4 10*3/uL (ref 4.0–10.5)

## 2012-10-30 LAB — COMPREHENSIVE METABOLIC PANEL
ALT: 24 U/L (ref 0–53)
AST: 17 U/L (ref 0–37)
Albumin: 4.1 g/dL (ref 3.5–5.2)
CO2: 30 mEq/L (ref 19–32)
Calcium: 9.8 mg/dL (ref 8.4–10.5)
Chloride: 101 mEq/L (ref 96–112)
GFR calc non Af Amer: 74 mL/min — ABNORMAL LOW (ref >90)
Sodium: 139 mEq/L (ref 135–145)

## 2012-10-30 LAB — LIPID PANEL
HDL: 75 mg/dL (ref >39)
LDL Cholesterol: 178 mg/dL — ABNORMAL HIGH (ref 0–99)
VLDL: 29 mg/dL (ref 0–40)

## 2012-10-30 LAB — MICROALBUMIN, URINE: Microalb, Ur: 1.99 mg/dL — ABNORMAL HIGH (ref 0.00–1.89)

## 2012-10-30 LAB — PSA: PSA: 8.98 ng/mL — ABNORMAL HIGH (ref <=4.00)

## 2012-12-06 ENCOUNTER — Encounter: Payer: Self-pay | Admitting: Gastroenterology

## 2013-02-16 ENCOUNTER — Encounter: Payer: Medicare Other | Admitting: Gastroenterology

## 2014-03-05 ENCOUNTER — Inpatient Hospital Stay (HOSPITAL_COMMUNITY)
Admission: EM | Admit: 2014-03-05 | Discharge: 2014-03-06 | DRG: 313 | Discharge status: Against Medical Advice | Payer: PRIVATE HEALTH INSURANCE | Attending: Cardiology | Admitting: Cardiology

## 2014-03-05 ENCOUNTER — Emergency Department (HOSPITAL_COMMUNITY): Payer: PRIVATE HEALTH INSURANCE

## 2014-03-05 ENCOUNTER — Encounter (HOSPITAL_COMMUNITY): Payer: Self-pay | Admitting: Emergency Medicine

## 2014-03-05 DIAGNOSIS — M479 Spondylosis, unspecified: Secondary | ICD-10-CM | POA: Diagnosis present

## 2014-03-05 DIAGNOSIS — I10 Essential (primary) hypertension: Secondary | ICD-10-CM | POA: Diagnosis present

## 2014-03-05 DIAGNOSIS — F1010 Alcohol abuse, uncomplicated: Secondary | ICD-10-CM | POA: Diagnosis present

## 2014-03-05 DIAGNOSIS — E780 Pure hypercholesterolemia: Secondary | ICD-10-CM | POA: Diagnosis present

## 2014-03-05 DIAGNOSIS — F1910 Other psychoactive substance abuse, uncomplicated: Secondary | ICD-10-CM | POA: Diagnosis present

## 2014-03-05 DIAGNOSIS — Z8673 Personal history of transient ischemic attack (TIA), and cerebral infarction without residual deficits: Secondary | ICD-10-CM

## 2014-03-05 DIAGNOSIS — F17210 Nicotine dependence, cigarettes, uncomplicated: Secondary | ICD-10-CM | POA: Diagnosis present

## 2014-03-05 DIAGNOSIS — Z96649 Presence of unspecified artificial hip joint: Secondary | ICD-10-CM | POA: Diagnosis present

## 2014-03-05 DIAGNOSIS — I2510 Atherosclerotic heart disease of native coronary artery without angina pectoris: Secondary | ICD-10-CM | POA: Diagnosis present

## 2014-03-05 DIAGNOSIS — G894 Chronic pain syndrome: Secondary | ICD-10-CM | POA: Diagnosis present

## 2014-03-05 DIAGNOSIS — D649 Anemia, unspecified: Secondary | ICD-10-CM | POA: Diagnosis present

## 2014-03-05 DIAGNOSIS — J40 Bronchitis, not specified as acute or chronic: Secondary | ICD-10-CM | POA: Diagnosis present

## 2014-03-05 DIAGNOSIS — R079 Chest pain, unspecified: Secondary | ICD-10-CM | POA: Diagnosis not present

## 2014-03-05 DIAGNOSIS — D473 Essential (hemorrhagic) thrombocythemia: Secondary | ICD-10-CM | POA: Diagnosis present

## 2014-03-05 DIAGNOSIS — G459 Transient cerebral ischemic attack, unspecified: Secondary | ICD-10-CM

## 2014-03-05 DIAGNOSIS — I429 Cardiomyopathy, unspecified: Secondary | ICD-10-CM | POA: Diagnosis present

## 2014-03-05 DIAGNOSIS — D72829 Elevated white blood cell count, unspecified: Secondary | ICD-10-CM | POA: Diagnosis present

## 2014-03-05 DIAGNOSIS — R0789 Other chest pain: Secondary | ICD-10-CM | POA: Diagnosis present

## 2014-03-05 DIAGNOSIS — M160 Bilateral primary osteoarthritis of hip: Secondary | ICD-10-CM | POA: Diagnosis present

## 2014-03-05 DIAGNOSIS — M25551 Pain in right hip: Secondary | ICD-10-CM

## 2014-03-05 DIAGNOSIS — D75839 Thrombocytosis, unspecified: Secondary | ICD-10-CM

## 2014-03-05 DIAGNOSIS — R200 Anesthesia of skin: Secondary | ICD-10-CM

## 2014-03-05 LAB — CBC WITH DIFFERENTIAL/PLATELET
BASOS PCT: 1 % (ref 0–1)
Basophils Absolute: 0.4 10*3/uL — ABNORMAL HIGH (ref 0.0–0.1)
EOS PCT: 2 % (ref 0–5)
Eosinophils Absolute: 0.9 10*3/uL — ABNORMAL HIGH (ref 0.0–0.7)
HEMATOCRIT: 36.8 % — AB (ref 39.0–52.0)
HEMOGLOBIN: 11.7 g/dL — AB (ref 13.0–17.0)
LYMPHS ABS: 5.6 10*3/uL — AB (ref 0.7–4.0)
Lymphocytes Relative: 13 % (ref 12–46)
MCH: 30.9 pg (ref 26.0–34.0)
MCHC: 31.8 g/dL (ref 30.0–36.0)
MCV: 97.1 fL (ref 78.0–100.0)
MONO ABS: 2.2 10*3/uL — AB (ref 0.1–1.0)
Monocytes Relative: 5 % (ref 3–12)
Neutro Abs: 34.3 10*3/uL — ABNORMAL HIGH (ref 1.7–7.7)
Neutrophils Relative %: 79 % — ABNORMAL HIGH (ref 43–77)
Platelets: 775 10*3/uL — ABNORMAL HIGH (ref 150–400)
RBC: 3.79 MIL/uL — AB (ref 4.22–5.81)
RDW: 16.1 % — ABNORMAL HIGH (ref 11.5–15.5)
WBC: 43.4 10*3/uL — ABNORMAL HIGH (ref 4.0–10.5)

## 2014-03-05 LAB — CBC
HCT: 33.5 % — ABNORMAL LOW (ref 39.0–52.0)
HEMOGLOBIN: 10.7 g/dL — AB (ref 13.0–17.0)
MCH: 30.2 pg (ref 26.0–34.0)
MCHC: 31.9 g/dL (ref 30.0–36.0)
MCV: 94.6 fL (ref 78.0–100.0)
Platelets: 773 10*3/uL — ABNORMAL HIGH (ref 150–400)
RBC: 3.54 MIL/uL — AB (ref 4.22–5.81)
RDW: 16 % — ABNORMAL HIGH (ref 11.5–15.5)
WBC: 40.7 10*3/uL — AB (ref 4.0–10.5)

## 2014-03-05 LAB — RAPID URINE DRUG SCREEN, HOSP PERFORMED
Amphetamines: NOT DETECTED
Barbiturates: NOT DETECTED
Benzodiazepines: NOT DETECTED
Cocaine: NOT DETECTED
OPIATES: NOT DETECTED
TETRAHYDROCANNABINOL: NOT DETECTED

## 2014-03-05 LAB — COMPREHENSIVE METABOLIC PANEL
ALK PHOS: 89 U/L (ref 39–117)
ALT: 15 U/L (ref 0–53)
ANION GAP: 13 (ref 5–15)
AST: 16 U/L (ref 0–37)
Albumin: 4.4 g/dL (ref 3.5–5.2)
BUN: 15 mg/dL (ref 6–23)
CALCIUM: 9.7 mg/dL (ref 8.4–10.5)
CO2: 26 mEq/L (ref 19–32)
Chloride: 102 mEq/L (ref 96–112)
Creatinine, Ser: 1.11 mg/dL (ref 0.50–1.35)
GFR, EST AFRICAN AMERICAN: 77 mL/min — AB (ref >90)
GFR, EST NON AFRICAN AMERICAN: 66 mL/min — AB (ref >90)
GLUCOSE: 97 mg/dL (ref 70–99)
Potassium: 4 mEq/L (ref 3.7–5.3)
Sodium: 141 mEq/L (ref 137–147)
TOTAL PROTEIN: 8.1 g/dL (ref 6.0–8.3)
Total Bilirubin: 0.3 mg/dL (ref 0.3–1.2)

## 2014-03-05 LAB — TROPONIN I: Troponin I: <0.3 ng/mL (ref <0.30)

## 2014-03-05 LAB — PRO B NATRIURETIC PEPTIDE: PRO B NATRI PEPTIDE: 376.5 pg/mL — AB (ref 0–125)

## 2014-03-05 MED ORDER — LEVOFLOXACIN 750 MG PO TABS
750.0000 mg | ORAL_TABLET | ORAL | Status: DC
  Administered 2014-03-06: 750 mg via ORAL
  Filled 2014-03-05: qty 1

## 2014-03-05 MED ORDER — HYDROCODONE-ACETAMINOPHEN 5-325 MG PO TABS
1.0000 | ORAL_TABLET | ORAL | Status: DC | PRN
  Administered 2014-03-06 (×2): 1 via ORAL
  Filled 2014-03-05 (×2): qty 1

## 2014-03-05 MED ORDER — ONDANSETRON HCL 4 MG/2ML IJ SOLN: 4.0000 mg | Freq: Four times a day (QID) | INTRAMUSCULAR | Status: DC | PRN

## 2014-03-05 MED ORDER — NITROGLYCERIN IN D5W 200-5 MCG/ML-% IV SOLN
5.0000 ug/min | INTRAVENOUS | Status: DC
  Administered 2014-03-06: 5 ug/min via INTRAVENOUS
  Filled 2014-03-05: qty 250

## 2014-03-05 MED ORDER — HEPARIN SODIUM (PORCINE) 5000 UNIT/ML IJ SOLN: 5000.0000 [IU] | Freq: Three times a day (TID) | INTRAMUSCULAR | Status: DC

## 2014-03-05 MED ORDER — ASPIRIN 300 MG RE SUPP: 300.0000 mg | RECTAL | Status: DC

## 2014-03-05 MED ORDER — CARVEDILOL 3.125 MG PO TABS
3.1250 mg | ORAL_TABLET | Freq: Two times a day (BID) | ORAL | Status: DC
  Administered 2014-03-06 (×2): 3.125 mg via ORAL
  Filled 2014-03-05 (×2): qty 1

## 2014-03-05 MED ORDER — ASPIRIN 81 MG PO CHEW
324.0000 mg | CHEWABLE_TABLET | Freq: Once | ORAL | Status: AC
  Administered 2014-03-05: 324 mg via ORAL
  Filled 2014-03-05: qty 4

## 2014-03-05 MED ORDER — NITROGLYCERIN 0.4 MG SL SUBL: 0.4000 mg | SUBLINGUAL_TABLET | SUBLINGUAL | Status: DC | PRN

## 2014-03-05 MED ORDER — HEPARIN BOLUS VIA INFUSION
4000.0000 [IU] | Freq: Once | INTRAVENOUS | Status: AC
  Administered 2014-03-06: 4000 [IU] via INTRAVENOUS
  Filled 2014-03-05: qty 4000

## 2014-03-05 MED ORDER — HEPARIN (PORCINE) IN NACL 100-0.45 UNIT/ML-% IJ SOLN
1150.0000 [IU]/h | INTRAMUSCULAR | Status: DC
  Administered 2014-03-06: 1000 [IU]/h via INTRAVENOUS
  Filled 2014-03-05: qty 250

## 2014-03-05 MED ORDER — ASPIRIN 81 MG PO CHEW: 324.0000 mg | CHEWABLE_TABLET | ORAL | Status: DC

## 2014-03-05 MED ORDER — LISINOPRIL 20 MG PO TABS
20.0000 mg | ORAL_TABLET | Freq: Every day | ORAL | Status: DC
  Administered 2014-03-06: 20 mg via ORAL
  Filled 2014-03-05: qty 1

## 2014-03-05 MED ORDER — ACETAMINOPHEN 325 MG PO TABS: 650.0000 mg | ORAL_TABLET | ORAL | Status: DC | PRN

## 2014-03-05 MED ORDER — HYDROCODONE-ACETAMINOPHEN 5-325 MG PO TABS
2.0000 | ORAL_TABLET | Freq: Once | ORAL | Status: AC
  Administered 2014-03-05: 2 via ORAL
  Filled 2014-03-05: qty 2

## 2014-03-05 MED ORDER — ASPIRIN EC 81 MG PO TBEC
81.0000 mg | DELAYED_RELEASE_TABLET | Freq: Every day | ORAL | Status: DC
  Administered 2014-03-06: 81 mg via ORAL
  Filled 2014-03-05: qty 1

## 2014-03-05 MED ORDER — ATORVASTATIN CALCIUM 40 MG PO TABS: 40.0000 mg | ORAL_TABLET | Freq: Every day | ORAL | Status: DC

## 2014-03-05 MED ORDER — SODIUM CHLORIDE 0.9 % IV SOLN
INTRAVENOUS | Status: DC
  Administered 2014-03-06: 01:00:00 via INTRAVENOUS

## 2014-03-05 NOTE — ED Provider Notes (Signed)
CSN: [id number]     Arrival date & time 03/05/14  1554 History   First MD Initiated Contact with Patient 03/05/14 1555     Chief Complaint  Patient presents with  . Numbness     (Consider location/radiation/quality/duration/timing/severity/associated sxs/prior Treatment) Patient is a 68 y.o. male presenting with neurologic complaint and chest pain. The history is provided by the patient.  Neurologic Problem This is a recurrent problem. The current episode started more than 2 days ago. The problem occurs daily. The problem has been gradually worsening. Associated symptoms include chest pain. Pertinent negatives include no abdominal pain and no shortness of breath. Nothing aggravates the symptoms. He has tried nothing for the symptoms.  Chest Pain Pain location:  Substernal area Pain quality: pressure   Pain radiates to:  Does not radiate Pain severity:  Moderate Onset quality:  Gradual Timing:  Intermittent Progression:  Unchanged Chronicity:  New Context: at rest   Relieved by:  Nothing Worsened by:  Nothing tried Associated symptoms: no abdominal pain, no cough, no fever, no shortness of breath and not vomiting     Past Medical History  Diagnosis Date  . Coronary artery disease   . Hypertension   . Hypercholesterolemia   . Stroke    Past Surgical History  Procedure Laterality Date  . Back surgery    . Hip arthroplasty     Family History  Problem Relation Age of Onset  . Diabetes Mother   . Diabetes Father    History  Substance Use Topics  . Smoking status: Current Every Day Smoker    Types: Cigarettes  . Smokeless tobacco: Not on file  . Alcohol Use: Yes    Review of Systems  Constitutional: Negative for fever.  Respiratory: Negative for cough and shortness of breath.   Cardiovascular: Positive for chest pain.  Gastrointestinal: Negative for vomiting and abdominal pain.  All other systems reviewed and are negative.     Allergies  Review of patient's  allergies indicates no known allergies.  Home Medications   Prior to Admission medications   Medication Sig Start Date End Date Taking? Authorizing Provider  ALPRAZolam Duanne Moron) 1 MG tablet Take 1 mg by mouth 2 (two) times daily as needed for sleep or anxiety.    Historical Provider, MD  hydrochlorothiazide (HYDRODIURIL) 25 MG tablet Take 25 mg by mouth daily.    Historical Provider, MD  metoprolol (LOPRESSOR) 50 MG tablet Take 50 mg by mouth 2 (two) times daily.    Historical Provider, MD  oxyCODONE-acetaminophen (PERCOCET) 10-325 MG per tablet Take 1 tablet by mouth every 4 (four) hours as needed for pain. 09/06/12   Tatyana A Kirichenko, PA-C   BP 136/76 mmHg  Pulse 64  Temp(Src) 97.6 F (36.4 C) (Oral)  Resp 20  SpO2 100% Physical Exam  Constitutional: He is oriented to person, place, and time. He appears well-developed and well-nourished. No distress.  HENT:  Head: Normocephalic and atraumatic.  Mouth/Throat: Oropharynx is clear and moist. No oropharyngeal exudate.  Eyes: EOM are normal. Pupils are equal, round, and reactive to light.  Neck: Normal range of motion. Neck supple.  Cardiovascular: Normal rate and regular rhythm.  Exam reveals no friction rub.   No murmur heard. Pulmonary/Chest: Effort normal and breath sounds normal. No respiratory distress. He has no wheezes. He has no rales.  Abdominal: Soft. He exhibits no distension. There is no tenderness. There is no rebound.  Musculoskeletal: Normal range of motion. He exhibits no edema.  Neurological: He  is alert and oriented to person, place, and time. No cranial nerve deficit. He exhibits normal muscle tone. Coordination normal.  Skin: No rash noted. He is not diaphoretic.  Nursing note and vitals reviewed.   ED Course  Procedures (including critical care time) Labs Review Labs Reviewed  CBC WITH DIFFERENTIAL - Abnormal; Notable for the following:    WBC 43.4 (*)    RBC 3.79 (*)    Hemoglobin 11.7 (*)    HCT 36.8  (*)    RDW 16.1 (*)    Platelets 775 (*)    All other components within normal limits  COMPREHENSIVE METABOLIC PANEL - Abnormal; Notable for the following:    GFR calc non Af Amer 66 (*)    GFR calc Af Amer 77 (*)    All other components within normal limits  TROPONIN I  PRO B NATRIURETIC PEPTIDE    Imaging Review Dg Chest 2 View  03/05/2014   CLINICAL DATA:  Left arm numbness for 1 week.  EXAM: CHEST  2 VIEW  COMPARISON:  PA and lateral chest 10/30/2012.  FINDINGS: Heart size and mediastinal contours are within normal limits. Both lungs are clear. Visualized skeletal structures are unremarkable.  IMPRESSION: Negative exam.   Electronically Signed   By: Inge Rise M.D.   On: 03/05/2014 17:13   Dg Pelvis 1-2 Views  03/05/2014   CLINICAL DATA:  Chronic right hip pain  EXAM: PELVIS - 1-2 VIEW  COMPARISON:  None.  FINDINGS: A medullary rod is noted within the right femur. No acute fracture in the pelvis is seen. Degenerative changes of the hip joints are noted. No soft tissue changes are seen.  IMPRESSION: Postoperative change without acute abnormality.   Electronically Signed   By: Inez Catalina M.D.   On: 03/05/2014 17:14   Ct Head Wo Contrast  03/05/2014   CLINICAL DATA:  Left-sided weakness and numbness, slurred speech this morning. History of CVA.  EXAM: CT HEAD WITHOUT CONTRAST  TECHNIQUE: Contiguous axial images were obtained from the base of the skull through the vertex without intravenous contrast.  COMPARISON:  09/11/2010  FINDINGS: Atherosclerotic and physiologic intracranial calcifications. Old bilateral nasal bone fractures. Mild atrophy.  There is no evidence of acute intracranial hemorrhage, brain edema, mass lesion, acute infarction, mass effect, or midline shift. Acute infarct may be inapparent on noncontrast CT. No other intra-axial abnormalities are seen, and the ventricles and sulci are within normal limits in size and symmetry. No abnormal extra-axial fluid collections or  masses are identified. No significant calvarial abnormality.  IMPRESSION: 1. Negative for bleed or other acute intracranial process.   Electronically Signed   By: Arne Cleveland M.D.   On: 03/05/2014 16:49     EKG Interpretation   Date/Time:  Tuesday March 05 2014 14:16:58 EST Ventricular Rate:  66 PR Interval:  150 QRS Duration: 90 QT Interval:  420 QTC Calculation: 440 R Axis:   3 Text Interpretation:  Normal sinus rhythm Minimal voltage criteria for  LVH, may be normal variant T wave abnormality, consider inferolateral  ischemia Abnormal ECG Similar to prior Confirmed by Mingo Amber  MD, [place]  ([id number]) on 03/05/2014 4:52:42 PM      MDM   Final diagnoses:  Left arm numbness    4M here with multiple complaints. L arm numbness - intermittent, lasting up to 30 minutes at a time. Worsening over past 5 days. Hx of CVA and TIAs, prior TIAs involved left arm, but were characterized as weakness rather  than numbness. Having some now, began during out exam. Will plan for Head CT. Nonfocal neuro exam. Chest pain - rated 7/10, pressure, central, nonradiating. Hx of CAD.  R hip pain - concerned his rods are moving around. No trauma. NVI distally.  Labs show marked leukocytosis and thrombocytosis. Concern for these cell line elevations causing clots leading to his symptoms.  Dr. Terrence Dupont admitting.    Evelina Bucy, MD 03/06/14 [phone]

## 2014-03-05 NOTE — ED Notes (Signed)
Attempted Report 

## 2014-03-05 NOTE — ED Notes (Addendum)
PT coming from urgent care with symptoms presenting for a week. Pt states he thinks speech is funny and face is numb. Pt is poor historian. States tooth is loose and bothering him. Left arm numbness that comes and goes; no weakness. Hx of TIAs

## 2014-03-05 NOTE — Plan of Care (Signed)
Problem: Consults Goal: Chest Pain Patient Education (See Patient Education module for education specifics.)  Outcome: Progressing Goal: Skin Care Protocol Initiated - if Braden Score 18 or less If consults are not indicated, leave blank or document N/A  Outcome: Not Applicable Date Met:  69/24/93 Goal: Tobacco Cessation referral if indicated Outcome: Not Applicable Date Met:  24/19/91 Goal: Nutrition Consult-if indicated Outcome: Not Applicable Date Met:  44/45/84 Goal: Diabetes Guidelines if Diabetic/Glucose > 140 If diabetic or lab glucose is > 140 mg/dl - Initiate Diabetes/Hyperglycemia Guidelines & Document Interventions  Outcome: Not Applicable Date Met:  83/50/75  Problem: Phase I Progression Outcomes Goal: Hemodynamically stable Outcome: Completed/Met Date Met:  03/05/14 Goal: Anginal pain relieved Outcome: Completed/Met Date Met:  03/05/14 Goal: Aspirin unless contraindicated Outcome: Completed/Met Date Met:  03/05/14 Goal: MD aware of Cardiac Marker results Outcome: Completed/Met Date Met:  03/05/14 Goal: Voiding-avoid urinary catheter unless indicated Outcome: Completed/Met Date Met:  03/05/14 Goal: Other Phase I Outcomes/Goals Outcome: Not Applicable Date Met:  73/22/56

## 2014-03-05 NOTE — ED Notes (Signed)
Dr. Walden at bedside 

## 2014-03-05 NOTE — H&P (Signed)
Bruce [name] is an 68 y.o. male.   Chief Complaint: Vague left-sided chest pain associated with left common numbness and marked leukocytosis/thrombocytosis HPI: Patient is 68 year old male with past medical history significant for mild coronary artery disease, nonischemic cardiomyopathy, EF approximately 40%, hypertension, history of alcohol abuse, tobacco abuse, degenerative joint disease, history of CVA 2, came to the ER complaining of vague left-sided chest pain associated with left arm numbness off and on lasting 15-20 minutes. Patient is very vague in his history. Denies any anginal chest pain. Denies nausea vomiting diaphoresis. Denies any weakness in the arms or legs. Denies any slurred speech. Patient also complains of cough with yellowish phlegm for last few weeks took over-the-counter medications without much improvement. Denies any fever or chills. In ED patient was noted to have marked leukocytosis of above 40,000. Patient denies any abdominal pain denies any diarrhea and denies any  recent antibiotic use. Patient denies any recent surgeries or abscesses. Patient had normal white count few months ago. Patient also complains of chronic back and hip pain. States continues to smoke but has cut down to half pack daily.  Past Medical History  Diagnosis Date  . Coronary artery disease   . Hypertension   . Hypercholesterolemia   . Stroke     Past Surgical History  Procedure Laterality Date  . Back surgery    . Hip arthroplasty      Family History  Problem Relation Age of Onset  . Diabetes Mother   . Diabetes Father    Social History:  reports that he has been smoking Cigarettes.  He has been smoking about 0.00 packs per day. He does not have any smokeless tobacco history on file. He reports that he drinks alcohol. He reports that he does not use illicit drugs.  Allergies: No Known Allergies   (Not in a hospital admission)  Results for orders placed or performed during the  hospital encounter of 03/05/14 (from the past 48 hour(s))  CBC with Differential     Status: Abnormal   Collection Time: 03/05/14  3:15 PM  Result Value Ref Range   WBC 43.4 (H) 4.0 - 10.5 K/uL   RBC 3.79 (L) 4.22 - 5.81 MIL/uL   Hemoglobin 11.7 (L) 13.0 - 17.0 g/dL   HCT 36.8 (L) 39.0 - 52.0 %   MCV 97.1 78.0 - 100.0 fL   MCH 30.9 26.0 - 34.0 pg   MCHC 31.8 30.0 - 36.0 g/dL   RDW 16.1 (H) 11.5 - 15.5 %   Platelets 775 (H) 150 - 400 K/uL   Neutrophils Relative % 79 (H) 43 - 77 %   Lymphocytes Relative 13 12 - 46 %   Monocytes Relative 5 3 - 12 %   Eosinophils Relative 2 0 - 5 %   Basophils Relative 1 0 - 1 %   Neutro Abs 34.3 (H) 1.7 - 7.7 K/uL   Lymphs Abs 5.6 (H) 0.7 - 4.0 K/uL   Monocytes Absolute 2.2 (H) 0.1 - 1.0 K/uL   Eosinophils Absolute 0.9 (H) 0.0 - 0.7 K/uL   Basophils Absolute 0.4 (H) 0.0 - 0.1 K/uL  Comprehensive metabolic panel     Status: Abnormal   Collection Time: 03/05/14  3:15 PM  Result Value Ref Range   Sodium 141 137 - 147 mEq/L   Potassium 4.0 3.7 - 5.3 mEq/L   Chloride 102 96 - 112 mEq/L   CO2 26 19 - 32 mEq/L   Glucose, Bld 97 70 - 99  mg/dL   BUN 15 6 - 23 mg/dL   Creatinine, Ser 1.11 0.50 - 1.35 mg/dL   Calcium 9.7 8.4 - 10.5 mg/dL   Total Protein 8.1 6.0 - 8.3 g/dL   Albumin 4.4 3.5 - 5.2 g/dL   AST 16 0 - 37 U/L   ALT 15 0 - 53 U/L   Alkaline Phosphatase 89 39 - 117 U/L   Total Bilirubin 0.3 0.3 - 1.2 mg/dL   GFR calc non Af Amer 66 (L) >90 mL/min   GFR calc Af Amer 77 (L) >90 mL/min    Comment: (NOTE) The eGFR has been calculated using the CKD EPI equation. This calculation has not been validated in all clinical situations. eGFR's persistently <90 mL/min signify possible Chronic Kidney Disease.    Anion gap 13 5 - 15  Troponin I     Status: None   Collection Time: 03/05/14  3:15 PM  Result Value Ref Range   Troponin I <0.30 <0.30 ng/mL    Comment:        Due to the release kinetics of cTnI, a negative result within the first  hours of the onset of symptoms does not rule out myocardial infarction with certainty. If myocardial infarction is still suspected, repeat the test at appropriate intervals.   Pro b natriuretic peptide (BNP)     Status: Abnormal   Collection Time: 03/05/14  3:15 PM  Result Value Ref Range   Pro B Natriuretic peptide (BNP) 376.5 (H) 0 - 125 pg/mL  CBC     Status: Abnormal   Collection Time: 03/05/14  5:57 PM  Result Value Ref Range   WBC 40.7 (H) 4.0 - 10.5 K/uL   RBC 3.54 (L) 4.22 - 5.81 MIL/uL   Hemoglobin 10.7 (L) 13.0 - 17.0 g/dL   HCT 33.5 (L) 39.0 - 52.0 %   MCV 94.6 78.0 - 100.0 fL   MCH 30.2 26.0 - 34.0 pg   MCHC 31.9 30.0 - 36.0 g/dL   RDW 16.0 (H) 11.5 - 15.5 %   Platelets 773 (H) 150 - 400 K/uL  Drug screen panel, emergency     Status: None   Collection Time: 03/05/14  6:27 PM  Result Value Ref Range   Opiates NONE DETECTED NONE DETECTED   Cocaine NONE DETECTED NONE DETECTED   Benzodiazepines NONE DETECTED NONE DETECTED   Amphetamines NONE DETECTED NONE DETECTED   Tetrahydrocannabinol NONE DETECTED NONE DETECTED   Barbiturates NONE DETECTED NONE DETECTED    Comment:        DRUG SCREEN FOR MEDICAL PURPOSES ONLY.  IF CONFIRMATION IS NEEDED FOR ANY PURPOSE, NOTIFY LAB WITHIN 5 DAYS.        LOWEST DETECTABLE LIMITS FOR URINE DRUG SCREEN Drug Class       Cutoff (ng/mL) Amphetamine      1000 Barbiturate      200 Benzodiazepine   706 Tricyclics       237 Opiates          300 Cocaine          300 THC              50    Dg Chest 2 View  03/05/2014   CLINICAL DATA:  Left arm numbness for 1 week.  EXAM: CHEST  2 VIEW  COMPARISON:  PA and lateral chest 10/30/2012.  FINDINGS: Heart size and mediastinal contours are within normal limits. Both lungs are clear. Visualized skeletal structures are unremarkable.  IMPRESSION: Negative exam.  Electronically Signed   By: Inge Rise M.D.   On: 03/05/2014 17:13   Dg Pelvis 1-2 Views  03/05/2014   CLINICAL DATA:   Chronic right hip pain  EXAM: PELVIS - 1-2 VIEW  COMPARISON:  None.  FINDINGS: A medullary rod is noted within the right femur. No acute fracture in the pelvis is seen. Degenerative changes of the hip joints are noted. No soft tissue changes are seen.  IMPRESSION: Postoperative change without acute abnormality.   Electronically Signed   By: Inez Catalina M.D.   On: 03/05/2014 17:14   Ct Head Wo Contrast  03/05/2014   CLINICAL DATA:  Left-sided weakness and numbness, slurred speech this morning. History of CVA.  EXAM: CT HEAD WITHOUT CONTRAST  TECHNIQUE: Contiguous axial images were obtained from the base of the skull through the vertex without intravenous contrast.  COMPARISON:  09/11/2010  FINDINGS: Atherosclerotic and physiologic intracranial calcifications. Old bilateral nasal bone fractures. Mild atrophy.  There is no evidence of acute intracranial hemorrhage, brain edema, mass lesion, acute infarction, mass effect, or midline shift. Acute infarct may be inapparent on noncontrast CT. No other intra-axial abnormalities are seen, and the ventricles and sulci are within normal limits in size and symmetry. No abnormal extra-axial fluid collections or masses are identified. No significant calvarial abnormality.  IMPRESSION: 1. Negative for bleed or other acute intracranial process.   Electronically Signed   By: Arne Cleveland M.D.   On: 03/05/2014 16:49    Review of Systems  Constitutional: Negative for fever, chills and weight loss.  Eyes: Negative for blurred vision, double vision, photophobia and pain.  Respiratory: Positive for cough and sputum production. Negative for hemoptysis and shortness of breath.   Cardiovascular: Positive for chest pain. Negative for palpitations, orthopnea, claudication and leg swelling.  Gastrointestinal: Negative for nausea, vomiting, abdominal pain and diarrhea.  Genitourinary: Negative for dysuria and urgency.  Skin: Negative for rash.  Neurological: Negative for  dizziness, tingling, focal weakness, seizures and headaches.    Blood pressure 154/67, pulse 72, temperature 98.5 F (36.9 C), temperature source Oral, resp. rate 19, SpO2 96 %. Physical Exam  Constitutional: He is oriented to person, place, and time.  HENT:  Head: Normocephalic and atraumatic.  Eyes: Conjunctivae are normal. Left eye exhibits no discharge. No scleral icterus.  Neck: Normal range of motion. Neck supple. No JVD present. No tracheal deviation present. No thyromegaly present.  Cardiovascular: Normal rate and regular rhythm.  Exam reveals no gallop and no friction rub.   Murmur (Soft systolic murmur noted) heard. Respiratory: Effort normal and breath sounds normal. No respiratory distress. He has no wheezes. He has no rales.  GI: Soft. Bowel sounds are normal. He exhibits no distension. There is no tenderness. There is no rebound.  Musculoskeletal: He exhibits no edema or tenderness.  Neurological: He is alert and oriented to person, place, and time.     Assessment/Plan Atypical chest pain/left arm numbness rule out MI doubt TIA Hypertension Tobacco abuse History of alcohol abuse Marked leukocytosis/thrombocytosis questionable etiology  Acute bronchitis Plan As per orders Repeat CBC with differential Check peripheral smear Hematologic consultation   Clent Demark 03/05/2014, 7:28 PM

## 2014-03-05 NOTE — ED Notes (Signed)
Admitting at bedside 

## 2014-03-05 NOTE — Progress Notes (Signed)
ANTICOAGULATION CONSULT NOTE - Initial Consult  Pharmacy Consult for Heparin  Indication: chest pain/ACS  No Known Allergies  Patient Measurements: 84.8 kg  Vital Signs: Temp: 98.5 F (36.9 C) (11/24 1826) Temp Source: Oral (11/24 1420) BP: 133/72 mmHg (11/24 2100) Pulse Rate: 71 (11/24 2100)  Labs:  Recent Labs  03/05/14 1515 03/05/14 1757 03/05/14 2128  HGB 11.7* 10.7*  --   HCT 36.8* 33.5*  --   PLT 775* 773*  --   CREATININE 1.11  --   --   TROPONINI <0.30  --  <0.30    CrCl cannot be calculated (Unknown ideal weight.).   Medical History: Past Medical History  Diagnosis Date  . Coronary artery disease   . Hypertension   . Hypercholesterolemia   . Stroke    Assessment: Heparin for CP. Hgb 10.7. Other labs as above.   Goal of Therapy:  Heparin level 0.3-0.5, given stroke history, possible TIA Monitor platelets by anticoagulation protocol: Yes   Plan:  -Heparin 4000 units BOLUS -Start heparin drip at 1000 units/hr -0800 HL -Daily CBC/HL -Monitor for bleeding  Narda Bonds 03/05/2014,11:44 PM

## 2014-03-05 NOTE — ED Notes (Signed)
Patient transported to CT 

## 2014-03-06 LAB — CBC WITH DIFFERENTIAL/PLATELET
Basophils Absolute: 0.8 10*3/uL — ABNORMAL HIGH (ref 0.0–0.1)
Basophils Relative: 2 % — ABNORMAL HIGH (ref 0–1)
EOS PCT: 2 % (ref 0–5)
Eosinophils Absolute: 0.8 10*3/uL — ABNORMAL HIGH (ref 0.0–0.7)
HCT: 31.5 % — ABNORMAL LOW (ref 39.0–52.0)
Hemoglobin: 10.2 g/dL — ABNORMAL LOW (ref 13.0–17.0)
LYMPHS PCT: 16 % (ref 12–46)
Lymphs Abs: 6.6 10*3/uL — ABNORMAL HIGH (ref 0.7–4.0)
MCH: 31 pg (ref 26.0–34.0)
MCHC: 32.4 g/dL (ref 30.0–36.0)
MCV: 95.7 fL (ref 78.0–100.0)
MONO ABS: 2.1 10*3/uL — AB (ref 0.1–1.0)
Monocytes Relative: 5 % (ref 3–12)
NEUTROS PCT: 75 % (ref 43–77)
Neutro Abs: 30.7 10*3/uL — ABNORMAL HIGH (ref 1.7–7.7)
PLATELETS: 739 10*3/uL — AB (ref 150–400)
RBC: 3.29 MIL/uL — ABNORMAL LOW (ref 4.22–5.81)
RDW: 15.9 % — ABNORMAL HIGH (ref 11.5–15.5)
WBC: 41 10*3/uL — ABNORMAL HIGH (ref 4.0–10.5)

## 2014-03-06 LAB — LIPID PANEL
Cholesterol: 212 mg/dL — ABNORMAL HIGH (ref 0–200)
HDL: 57 mg/dL (ref >39)
LDL Cholesterol: 131 mg/dL — ABNORMAL HIGH (ref 0–99)
TRIGLYCERIDES: 122 mg/dL (ref <150)
Total CHOL/HDL Ratio: 3.7 RATIO
VLDL: 24 mg/dL (ref 0–40)

## 2014-03-06 LAB — HEPARIN LEVEL (UNFRACTIONATED): HEPARIN UNFRACTIONATED: 0.19 [IU]/mL — AB (ref 0.30–0.70)

## 2014-03-06 LAB — BASIC METABOLIC PANEL
Anion gap: 10 (ref 5–15)
BUN: 14 mg/dL (ref 6–23)
CO2: 27 mEq/L (ref 19–32)
Calcium: 8.7 mg/dL (ref 8.4–10.5)
Chloride: 102 mEq/L (ref 96–112)
Creatinine, Ser: 1.11 mg/dL (ref 0.50–1.35)
GFR calc Af Amer: 77 mL/min — ABNORMAL LOW (ref >90)
GFR, EST NON AFRICAN AMERICAN: 66 mL/min — AB (ref >90)
GLUCOSE: 98 mg/dL (ref 70–99)
Potassium: 3.9 mEq/L (ref 3.7–5.3)
SODIUM: 139 meq/L (ref 137–147)

## 2014-03-06 LAB — PATHOLOGIST SMEAR REVIEW

## 2014-03-06 LAB — TROPONIN I
Troponin I: <0.3 ng/mL (ref <0.30)
Troponin I: <0.3 ng/mL (ref <0.30)

## 2014-03-06 MED ORDER — HEPARIN BOLUS VIA INFUSION
1000.0000 [IU] | Freq: Once | INTRAVENOUS | Status: AC
  Administered 2014-03-06: 1000 [IU] via INTRAVENOUS
  Filled 2014-03-06: qty 1000

## 2014-03-06 MED ORDER — HEPARIN BOLUS VIA INFUSION
1150.0000 [IU] | Freq: Once | INTRAVENOUS | Status: DC
Start: 2014-03-06 — End: 2014-03-06
  Filled 2014-03-06: qty 1150

## 2014-03-06 NOTE — Progress Notes (Signed)
Pt left AMA, form signed, IV out. Pt advised not to leave by nursing staff, and Dr.Mohammed. Pt states he will seek care elsewhere. MD Notified. Etta Quill, RN

## 2014-03-06 NOTE — Consult Note (Signed)
[facility] NOTE  Patient Care Team: Elizabeth Palau, MD as PCP - General (General Surgery)  CHIEF COMPLAINTS/PURPOSE OF CONSULTATION:  Leukocytosis and Thrombocytosis  HISTORY OF PRESENTING ILLNESS:  Bruce [name] 68 y.o. male with multiple medical issues listed below, admitted with chest pain and left arm numbness. He also complained of yellow productive cough. He is a poor historian, and contradicts information regularly. He was found to have an abnormal CBC with a WBC of 43.4. His platelet count was 775,000. he was paced on Heparin per Pharmacy and ASA. He denies any fever, chills or night sweats. He denies intermittent headaches, shortness of breath on exertion, or frequent leg cramps.  He does have back pain, chronic. There is no prior diagnosis of obstructive sleep apnea. The patient denies weight loss or skin itching. The patient is a smoker and currently smokes around half to 1 pack of cigarettes per day for the last 40 years. He never suffer from diagnosis of blood clot. He does carry a history of strokes twice, in the setting of hypercholesterolemia and hypertension. He denies any slurred speech or gait instability. He is not on any ASA or NSAIDs. He also consumes significant amount of alcohol.  Today's CBC  shows a WBC of 41k, with ANC 30.7, Lymphs 6.6 and Mono 2.1 with platelets 739,000. He has mild anemia, but unchanged from prior labs when compared.  Of note, as of July of 2014, his CBC was unremarkable. Smear available for review. CT head negative for acute findings.   MEDICAL HISTORY:  Past Medical History  Diagnosis Date  . Coronary artery disease   . Hypertension   . Hypercholesterolemia   . Stroke       Osteoarthritis to the hip and L spine     Chronic pain syndrome     History of polysubstance abuse  SURGICAL HISTORY: Past Surgical History  Procedure Laterality Date  . Back surgery    . Hip arthroplasty      SOCIAL  HISTORY: History   Social History  . Marital Status: Widowed    Spouse Name: N/A    Number of Children: N/A  . Years of Education: N/A   Occupational History  . Not on file.   Social History Main Topics  . Smoking status: Current Every Day Smoker    Types: Cigarettes  . Smokeless tobacco: Not on file  . Alcohol Use: Yes  . Drug Use: No  . Sexual Activity: Not on file   Other Topics Concern  . Not on file   Social History Narrative    FAMILY HISTORY: Family History  Problem Relation Age of Onset  . Diabetes Mother   . Diabetes Father     ALLERGIES:  has No Known Allergies.  MEDICATIONS:  Scheduled Meds: . aspirin EC  81 mg Oral Daily  . atorvastatin  40 mg Oral q1800  . carvedilol  3.125 mg Oral BID  . levofloxacin  750 mg Oral Q24H  . lisinopril  20 mg Oral Daily   Continuous Infusions: . sodium chloride 50 mL/hr at 03/06/14 0032  . heparin 1,150 Units/hr (03/06/14 1000)  . nitroGLYCERIN 5 mcg/min (03/06/14 1000)   PRN Meds:.acetaminophen, HYDROcodone-acetaminophen, nitroGLYCERIN, ondansetron (ZOFRAN) IV   REVIEW OF SYSTEMS:   Constitutional: Denies fevers, chills or abnormal night sweats Eyes: Denies blurriness of vision, double vision or watery eyes Ears, nose, mouth, throat, and face: Denies mucositis or sore throat Respiratory: He denies shortness of breath but had  yelllow sputum production Cardiovascular: Denies palpitation, but had vague chest discomfort, no lower extremity swelling Gastrointestinal:  Denies nausea, heartburn or change in bowel habits Skin: Denies abnormal skin rashes Lymphatics: Denies new lymphadenopathy or easy bruising Neurological: Positive for left arm numbness no resolved, no tingling or new weaknesses Behavioral/Psych: Mood is stable, no new changes  All other systems were reviewed with the patient and are negative.  PHYSICAL EXAMINATION: ECOG PERFORMANCE STATUS: 2  Filed Vitals:   03/06/14 0414  BP:   Pulse:   Temp:  98.3 F (36.8 C)  Resp:    Filed Weights   03/05/14 2351 03/06/14 0414  Weight: 186 lb 14.4 oz (84.777 kg) 186 lb 14.4 oz (84.777 kg)    GENERAL:alert, no distress and comfortable SKIN: skin color, texture, turgor are normal, no rashes or significant lesions EYES: normal, conjunctiva are pink and non-injected, sclera clear OROPHARYNX:no exudate, no erythema and lips, buccal mucosa, and tongue normal  NECK: supple, thyroid normal size, non-tender, without nodularity LYMPH:  no palpable lymphadenopathy in the cervical, axillary or inguinal LUNGS: clear to auscultation and percussion with normal breathing effort HEART: regular rate & rhythm and 1/6 systolic murmur and no lower extremity edema ABDOMEN:abdomen soft, non-tender and normal bowel sounds Musculoskeletal:no cyanosis of digits and no clubbing  PSYCH: alert & oriented x 3 with fluent speech NEURO: no focal motor/sensory deficits  LABORATORY DATA:  CBC   Recent Labs Lab 03/05/14 1515 03/05/14 1757 03/06/14 0306  WBC 43.4* 40.7* 41.0*  HGB 11.7* 10.7* 10.2*  HCT 36.8* 33.5* 31.5*  PLT 775* 773* 739*  MCV 97.1 94.6 95.7  MCH 30.9 30.2 31.0  MCHC 31.8 31.9 32.4  RDW 16.1* 16.0* 15.9*  LYMPHSABS 5.6*  --  6.6*  MONOABS 2.2*  --  2.1*  EOSABS 0.9*  --  0.8*  BASOSABS 0.4*  --  0.8*     Anemia panel:  No results for input(s): VITAMINB12, FOLATE, FERRITIN, TIBC, IRON, RETICCTPCT in the last 72 hours.  No results for input(s): TSH, T4TOTAL, T3FREE, THYROIDAB in the last 72 hours.  Invalid input(s): FREET3   No results found for: ESRSEDRATE    CMP    Recent Labs Lab 03/05/14 1515 03/06/14 0306  NA 141 139  K 4.0 3.9  CL 102 102  CO2 26 27  GLUCOSE 97 98  BUN 15 14  CREATININE 1.11 1.11  CALCIUM 9.7 8.7  AST 16  --   ALT 15  --   ALKPHOS 89  --   BILITOT 0.3  --         Component Value Date/Time   BILITOT 0.3 03/05/2014 1515      No results for input(s): INR, PROTIME in the last 168  hours.  No results for input(s): DDIMER in the last 72 hours.   RADIOGRAPHIC STUDIES: I have personally reviewed the radiological images as listed and agreed with the findings in the report. Dg Chest 2 View  03/05/2014   CLINICAL DATA:  Left arm numbness for 1 week.  EXAM: CHEST  2 VIEW  COMPARISON:  PA and lateral chest 10/30/2012.  FINDINGS: Heart size and mediastinal contours are within normal limits. Both lungs are clear. Visualized skeletal structures are unremarkable.  IMPRESSION: Negative exam.   Electronically Signed   By: Inge Rise M.D.   On: 03/05/2014 17:13   Dg Pelvis 1-2 Views  03/05/2014   CLINICAL DATA:  Chronic right hip pain  EXAM: PELVIS - 1-2 VIEW  COMPARISON:  None.  FINDINGS: A medullary rod is noted within the right femur. No acute fracture in the pelvis is seen. Degenerative changes of the hip joints are noted. No soft tissue changes are seen.  IMPRESSION: Postoperative change without acute abnormality.   Electronically Signed   By: Inez Catalina M.D.   On: 03/05/2014 17:14   Ct Head Wo Contrast  03/05/2014   CLINICAL DATA:  Left-sided weakness and numbness, slurred speech this morning. History of CVA.  EXAM: CT HEAD WITHOUT CONTRAST  TECHNIQUE: Contiguous axial images were obtained from the base of the skull through the vertex without intravenous contrast.  COMPARISON:  09/11/2010  FINDINGS: Atherosclerotic and physiologic intracranial calcifications. Old bilateral nasal bone fractures. Mild atrophy.  There is no evidence of acute intracranial hemorrhage, brain edema, mass lesion, acute infarction, mass effect, or midline shift. Acute infarct may be inapparent on noncontrast CT. No other intra-axial abnormalities are seen, and the ventricles and sulci are within normal limits in size and symmetry. No abnormal extra-axial fluid collections or masses are identified. No significant calvarial abnormality.  IMPRESSION: 1. Negative for bleed or other acute intracranial  process.   Electronically Signed   By: Arne Cleveland M.D.   On: 03/05/2014 16:49    ASSESSMENT & PLAN #1 Elevated White Count Worsened in the setting of cardiac issues and ongoing tobacco abuse. Causes of leukocytosis are not confirmed.  Will check a peripheral blood smear to rule out abnormalities, may need bone marrow biopsy to evaluate for leukemic process  #2 Thrombocytosis Etiology unknown at this time. May have been exacerbated by his anemia and dehydration due to ETOH. He was not on antiplatelet agents prior to this admission Continue Heparin per Pharmacy and ASA. May consider Hydrea as patient had a prior history of stroke Awaiting peripheral blood smear results He may need a bone marrow biopsy to evaluate for underlying dysplastic process  #3 Anemia, of chronic disease No bleeding issues at this time.   #4 Polysubstance abuse Tobacco and Alcohol cessation recommended   #Atypical chest pain  as per cardiology  Full Code   Attending Note:  I came to [facility] hospital at 1600 and found that he has left hospital against medical advice. I never saw this patient.  He was told that he has possibility of having an acute myeloid leukemia AML, and that untreated it can be a fatal disease. I have already spoken with the pathologist and hospitalist and ordered a bone marrow biopsy to be done on 03/08/14. As per notes in EPIC and talking to the nurse covering patient, Dr Julien Nordmann (on call oncologist) even tried to talk with him and to convince him to stay but he left against medical advice.  I called his sister Herbert Pun at [phone] at 1620 EST and apprised her of situation and told her to bring him back to this hospital or take him to another hospital like [facility] where he can get medical attention and this is a critical situation. Sister trying to contact him.  I also spoke with his nephew Legrand Como at [phone] EST and he seems to have some understanding of medical issues and told him  that it is very serious condition and he said that they will find him and bring him back to hospital.  I will also ask the hospitalist team to try to reach him or his family later today or tomorrow so he can get re-admitted and complete his diagnostic work up. Today is my last day here at [facility]  hospital and I will notify the covering doctor for today and tomorrow about this situation.   No charge for this encounter.  Bernadene Bell, MD Medical Hematologist/Oncologist [place] Pager: [phone] Office No: [phone]

## 2014-03-06 NOTE — Progress Notes (Signed)
Subjective:  Patient denies any chest pain or shortness of breath denies any further arm pain. Patient eager to go home 3 sets of cardiac enzymes are negative. Review cardiac catheterization done approximately 3 years ago which showed mild CAD with mildly depressed LV systolic function. States has cut down on smoking and drinking. States willing to states willing to follow-up as outpatient with hematology/oncology for further workup if it is okay.  O.bjective:  Vital Signs in the last 24 hours: Temp:  [97.6 F (36.4 C)-98.5 F (36.9 C)] 98.3 F (36.8 C) (11/25 0414) Pulse Rate:  [64-78] 71 (11/24 2100) Resp:  [14-25] 15 (11/25 0900) BP: (121-154)/(62-90) 133/73 mmHg (11/25 1047) SpO2:  [95 %-100 %] 99 % (11/25 0414) Weight:  [84.777 kg (186 lb 14.4 oz)] 84.777 kg (186 lb 14.4 oz) (11/25 0414)  Intake/Output from previous day: 11/24 0701 - 11/25 0700 In: 386.5 [I.V.:386.5] Out: 800 [Urine:800] Intake/Output from this shift: Total I/O In: 630 [P.O.:480; I.V.:150] Out: 1050 [Urine:1050]  Physical Exam: Neck: no adenopathy, no carotid bruit, no JVD and supple, symmetrical, trachea midline Lungs: clear to auscultation bilaterally Heart: regular rate and rhythm, S1, S2 normal and Soft systolic murmur noted  Abdomen: soft, non-tender; bowel sounds normal; no masses,  no organomegaly Extremities: extremities normal, atraumatic, no cyanosis or edema  Lab Results:  Recent Labs  03/05/14 1757 03/06/14 0306  WBC 40.7* 41.0*  HGB 10.7* 10.2*  PLT 773* 739*    Recent Labs  03/05/14 1515 03/06/14 0306  NA 141 139  K 4.0 3.9  CL 102 102  CO2 26 27  GLUCOSE 97 98  BUN 15 14  CREATININE 1.11 1.11    Recent Labs  03/06/14 0306 03/06/14 0915  TROPONINI <0.30 <0.30   Hepatic Function Panel  Recent Labs  03/05/14 1515  PROT 8.1  ALBUMIN 4.4  AST 16  ALT 15  ALKPHOS 89  BILITOT 0.3    Recent Labs  03/06/14 0306  CHOL 212*   No results for input(s): PROTIME in  the last 72 hours.  Imaging: Imaging results have been reviewed and Dg Chest 2 View  03/05/2014   CLINICAL DATA:  Left arm numbness for 1 week.  EXAM: CHEST  2 VIEW  COMPARISON:  PA and lateral chest 10/30/2012.  FINDINGS: Heart size and mediastinal contours are within normal limits. Both lungs are clear. Visualized skeletal structures are unremarkable.  IMPRESSION: Negative exam.   Electronically Signed   By: Inge Rise M.D.   On: 03/05/2014 17:13   Dg Pelvis 1-2 Views  03/05/2014   CLINICAL DATA:  Chronic right hip pain  EXAM: PELVIS - 1-2 VIEW  COMPARISON:  None.  FINDINGS: A medullary rod is noted within the right femur. No acute fracture in the pelvis is seen. Degenerative changes of the hip joints are noted. No soft tissue changes are seen.  IMPRESSION: Postoperative change without acute abnormality.   Electronically Signed   By: Inez Catalina M.D.   On: 03/05/2014 17:14   Ct Head Wo Contrast  03/05/2014   CLINICAL DATA:  Left-sided weakness and numbness, slurred speech this morning. History of CVA.  EXAM: CT HEAD WITHOUT CONTRAST  TECHNIQUE: Contiguous axial images were obtained from the base of the skull through the vertex without intravenous contrast.  COMPARISON:  09/11/2010  FINDINGS: Atherosclerotic and physiologic intracranial calcifications. Old bilateral nasal bone fractures. Mild atrophy.  There is no evidence of acute intracranial hemorrhage, brain edema, mass lesion, acute infarction, mass effect, or midline  shift. Acute infarct may be inapparent on noncontrast CT. No other intra-axial abnormalities are seen, and the ventricles and sulci are within normal limits in size and symmetry. No abnormal extra-axial fluid collections or masses are identified. No significant calvarial abnormality.  IMPRESSION: 1. Negative for bleed or other acute intracranial process.   Electronically Signed   By: Arne Cleveland M.D.   On: 03/05/2014 16:49    Cardiac Studies:  Assessment/Plan:   Status post atypical chest pain MI ruled out Hypertension Tobacco abuse Alcohol abuse Marked leukocytosis/thrombocytosis questionable etiology Resolving bronchitis Plan Continue present management Will DC home today if okay with hematology/oncology.   LOS: 1 day    Candido Flott N 03/06/2014, 12:42 PM

## 2014-03-06 NOTE — Progress Notes (Addendum)
ANTICOAGULATION CONSULT NOTE - Follow Up Consult  Pharmacy Consult for Heparin  Indication: chest pain/ACS  No Known Allergies  Patient Measurements: 84.8 kg  Vital Signs: Temp: 98.3 F (36.8 C) (11/25 0414) BP: 122/68 mmHg (11/25 0300) Pulse Rate: 71 (11/24 2100)  Labs:  Recent Labs  03/05/14 1515 03/05/14 1757 03/05/14 2128 03/06/14 0306 03/06/14 0744  HGB 11.7* 10.7*  --  10.2*  --   HCT 36.8* 33.5*  --  31.5*  --   PLT 775* 773*  --  739*  --   HEPARINUNFRC  --   --   --   --  0.19*  CREATININE 1.11  --   --  1.11  --   TROPONINI <0.30  --  <0.30 <0.30  --     Estimated Creatinine Clearance: 67.6 mL/min (by C-G formula based on Cr of 1.11).   Medical History: Past Medical History  Diagnosis Date  . Coronary artery disease   . Hypertension   . Hypercholesterolemia   . Stroke    Assessment: 68 YOM on Heparin for CP/ACS.  Troponin x 2 negative. Hgb remains stable at 10.2 HL this AM is sub-therapeutic at 0.19 at infusion rate of 1000 units/hr.   Goal of Therapy:  Heparin level 0.3-0.5, given stroke history, possible TIA Monitor platelets by anticoagulation protocol: Yes   Plan:  -Give another Heparin 1000 units BOLUS -Increase heparin drip to 1150 units/hr -1530 HL  -Daily CBC/HL -Monitor for bleeding  Albertina Parr, PharmD.  Clinical Pharmacist Pager [phone]

## 2014-03-06 NOTE — Progress Notes (Signed)
Patient ID: Bruce [name], male   DOB: 1945/07/20, 68 y.o.   MRN: [id number]   Request received for BM bx in CT  Can tentatively schedule for 11/27 Bruce [name] to see pt for consent and preparation He tells me he is going home and this is planned to be performed as OP RN did not confirm this  Will check chart Fri am Have prepared with npo; labs; Hep off etc....---just in case

## 2014-03-06 NOTE — Progress Notes (Signed)
Pt states he is ready to go home. Pt has pulled off IV Fluids, and heart monitor. MD notified. We are awaiting the Oncologist MD to see the patient. Pt says he is fine and does not feel like waiting on the Dr. Aletha Halim continue to monitor pt and educate. Etta Quill, RN

## 2014-03-08 ENCOUNTER — Other Ambulatory Visit (HOSPITAL_COMMUNITY): Payer: Self-pay | Admitting: Radiology

## 2014-03-08 ENCOUNTER — Ambulatory Visit (HOSPITAL_COMMUNITY)
Admit: 2014-03-08 | Discharge: 2014-03-08 | Disposition: A | Discharge status: Home / Self Care | Payer: Medicare Other | Attending: Hematology | Admitting: Hematology

## 2014-03-08 DIAGNOSIS — D72819 Decreased white blood cell count, unspecified: Secondary | ICD-10-CM

## 2014-03-18 ENCOUNTER — Emergency Department (HOSPITAL_COMMUNITY): Payer: PRIVATE HEALTH INSURANCE

## 2014-03-18 ENCOUNTER — Observation Stay (HOSPITAL_COMMUNITY)
Admission: EM | Admit: 2014-03-18 | Discharge: 2014-03-21 | Disposition: A | Discharge status: Home / Self Care | Payer: PRIVATE HEALTH INSURANCE | Attending: Cardiology | Admitting: Cardiology

## 2014-03-18 ENCOUNTER — Encounter (HOSPITAL_COMMUNITY): Payer: Self-pay | Admitting: Family Medicine

## 2014-03-18 DIAGNOSIS — Z7189 Other specified counseling: Secondary | ICD-10-CM

## 2014-03-18 DIAGNOSIS — D75839 Thrombocytosis, unspecified: Secondary | ICD-10-CM

## 2014-03-18 DIAGNOSIS — F17210 Nicotine dependence, cigarettes, uncomplicated: Secondary | ICD-10-CM | POA: Diagnosis not present

## 2014-03-18 DIAGNOSIS — I10 Essential (primary) hypertension: Secondary | ICD-10-CM | POA: Insufficient documentation

## 2014-03-18 DIAGNOSIS — M25559 Pain in unspecified hip: Principal | ICD-10-CM | POA: Insufficient documentation

## 2014-03-18 DIAGNOSIS — R1031 Right lower quadrant pain: Secondary | ICD-10-CM | POA: Diagnosis not present

## 2014-03-18 DIAGNOSIS — R740 Nonspecific elevation of levels of transaminase and lactic acid dehydrogenase [LDH]: Secondary | ICD-10-CM

## 2014-03-18 DIAGNOSIS — G8929 Other chronic pain: Secondary | ICD-10-CM | POA: Insufficient documentation

## 2014-03-18 DIAGNOSIS — E780 Pure hypercholesterolemia: Secondary | ICD-10-CM | POA: Insufficient documentation

## 2014-03-18 DIAGNOSIS — R188 Other ascites: Secondary | ICD-10-CM | POA: Insufficient documentation

## 2014-03-18 DIAGNOSIS — I429 Cardiomyopathy, unspecified: Secondary | ICD-10-CM | POA: Diagnosis not present

## 2014-03-18 DIAGNOSIS — I5022 Chronic systolic (congestive) heart failure: Secondary | ICD-10-CM | POA: Diagnosis not present

## 2014-03-18 DIAGNOSIS — D473 Essential (hemorrhagic) thrombocythemia: Secondary | ICD-10-CM | POA: Insufficient documentation

## 2014-03-18 DIAGNOSIS — Z8673 Personal history of transient ischemic attack (TIA), and cerebral infarction without residual deficits: Secondary | ICD-10-CM | POA: Diagnosis not present

## 2014-03-18 DIAGNOSIS — I2510 Atherosclerotic heart disease of native coronary artery without angina pectoris: Secondary | ICD-10-CM | POA: Diagnosis not present

## 2014-03-18 DIAGNOSIS — D72829 Elevated white blood cell count, unspecified: Secondary | ICD-10-CM | POA: Diagnosis present

## 2014-03-18 DIAGNOSIS — Z79899 Other long term (current) drug therapy: Secondary | ICD-10-CM | POA: Insufficient documentation

## 2014-03-18 DIAGNOSIS — Z7982 Long term (current) use of aspirin: Secondary | ICD-10-CM | POA: Diagnosis not present

## 2014-03-18 DIAGNOSIS — M549 Dorsalgia, unspecified: Secondary | ICD-10-CM | POA: Insufficient documentation

## 2014-03-18 DIAGNOSIS — R161 Splenomegaly, not elsewhere classified: Secondary | ICD-10-CM | POA: Diagnosis not present

## 2014-03-18 DIAGNOSIS — D649 Anemia, unspecified: Secondary | ICD-10-CM | POA: Insufficient documentation

## 2014-03-18 DIAGNOSIS — R7402 Elevation of levels of lactic acid dehydrogenase (LDH): Secondary | ICD-10-CM

## 2014-03-18 DIAGNOSIS — C9590 Leukemia, unspecified not having achieved remission: Secondary | ICD-10-CM

## 2014-03-18 LAB — URINE MICROSCOPIC-ADD ON

## 2014-03-18 LAB — COMPREHENSIVE METABOLIC PANEL
ALK PHOS: 91 U/L (ref 39–117)
ALT: 21 U/L (ref 0–53)
AST: 25 U/L (ref 0–37)
Albumin: 4.2 g/dL (ref 3.5–5.2)
Anion gap: 16 — ABNORMAL HIGH (ref 5–15)
BUN: 13 mg/dL (ref 6–23)
CHLORIDE: 99 meq/L (ref 96–112)
CO2: 22 meq/L (ref 19–32)
Calcium: 9.5 mg/dL (ref 8.4–10.5)
Creatinine, Ser: 1.04 mg/dL (ref 0.50–1.35)
GFR calc Af Amer: 83 mL/min — ABNORMAL LOW (ref >90)
GFR, EST NON AFRICAN AMERICAN: 72 mL/min — AB (ref >90)
Glucose, Bld: 205 mg/dL — ABNORMAL HIGH (ref 70–99)
Potassium: 4.2 mEq/L (ref 3.7–5.3)
SODIUM: 137 meq/L (ref 137–147)
Total Bilirubin: 0.2 mg/dL — ABNORMAL LOW (ref 0.3–1.2)
Total Protein: 8.2 g/dL (ref 6.0–8.3)

## 2014-03-18 LAB — URINALYSIS, ROUTINE W REFLEX MICROSCOPIC
Bilirubin Urine: NEGATIVE
Glucose, UA: NEGATIVE mg/dL
HGB URINE DIPSTICK: NEGATIVE
Ketones, ur: NEGATIVE mg/dL
Nitrite: NEGATIVE
PROTEIN: NEGATIVE mg/dL
Specific Gravity, Urine: 1.021 (ref 1.005–1.030)
UROBILINOGEN UA: 1 mg/dL (ref 0.0–1.0)
pH: 5.5 (ref 5.0–8.0)

## 2014-03-18 LAB — I-STAT CG4 LACTIC ACID, ED: Lactic Acid, Venous: 1.43 mmol/L (ref 0.5–2.2)

## 2014-03-18 LAB — LACTATE DEHYDROGENASE: LDH: 526 U/L — AB (ref 94–250)

## 2014-03-18 MED ORDER — SENNOSIDES-DOCUSATE SODIUM 8.6-50 MG PO TABS: 1.0000 | ORAL_TABLET | Freq: Every evening | ORAL | Status: DC | PRN

## 2014-03-18 MED ORDER — IOHEXOL 300 MG/ML  SOLN
100.0000 mL | Freq: Once | INTRAMUSCULAR | Status: AC | PRN
  Administered 2014-03-18: 100 mL via INTRAVENOUS

## 2014-03-18 MED ORDER — SODIUM CHLORIDE 0.9 % IV SOLN
INTRAVENOUS | Status: DC
  Administered 2014-03-19 – 2014-03-20 (×4): via INTRAVENOUS

## 2014-03-18 MED ORDER — HYDROCODONE-ACETAMINOPHEN 5-325 MG PO TABS
1.0000 | ORAL_TABLET | ORAL | Status: DC | PRN
  Administered 2014-03-19 – 2014-03-20 (×5): 2 via ORAL
  Filled 2014-03-18 (×5): qty 2

## 2014-03-18 MED ORDER — DOCUSATE SODIUM 100 MG PO CAPS
100.0000 mg | ORAL_CAPSULE | Freq: Two times a day (BID) | ORAL | Status: DC
  Administered 2014-03-19 – 2014-03-21 (×6): 100 mg via ORAL
  Filled 2014-03-18 (×5): qty 1

## 2014-03-18 MED ORDER — HEPARIN SODIUM (PORCINE) 5000 UNIT/ML IJ SOLN
5000.0000 [IU] | Freq: Three times a day (TID) | INTRAMUSCULAR | Status: DC
  Administered 2014-03-19 (×3): 5000 [IU] via SUBCUTANEOUS
  Filled 2014-03-18 (×4): qty 1

## 2014-03-18 MED ORDER — ATORVASTATIN CALCIUM 40 MG PO TABS
40.0000 mg | ORAL_TABLET | Freq: Every morning | ORAL | Status: DC
  Administered 2014-03-19 – 2014-03-21 (×3): 40 mg via ORAL
  Filled 2014-03-18 (×3): qty 1

## 2014-03-18 MED ORDER — MORPHINE SULFATE 4 MG/ML IJ SOLN
4.0000 mg | Freq: Once | INTRAMUSCULAR | Status: AC
  Administered 2014-03-18: 4 mg via INTRAVENOUS
  Filled 2014-03-18: qty 1

## 2014-03-18 MED ORDER — CARVEDILOL 3.125 MG PO TABS
3.1250 mg | ORAL_TABLET | Freq: Two times a day (BID) | ORAL | Status: DC
  Administered 2014-03-19 – 2014-03-21 (×5): 3.125 mg via ORAL
  Filled 2014-03-18 (×7): qty 1

## 2014-03-18 MED ORDER — LISINOPRIL 5 MG PO TABS
5.0000 mg | ORAL_TABLET | Freq: Every day | ORAL | Status: DC
  Administered 2014-03-19 – 2014-03-21 (×3): 5 mg via ORAL
  Filled 2014-03-18 (×3): qty 1

## 2014-03-18 MED ORDER — ONDANSETRON HCL 4 MG/2ML IJ SOLN: 4.0000 mg | Freq: Three times a day (TID) | INTRAMUSCULAR | Status: AC | PRN

## 2014-03-18 MED ORDER — HYDROMORPHONE HCL 1 MG/ML IJ SOLN
1.0000 mg | INTRAMUSCULAR | Status: AC | PRN
  Administered 2014-03-19 (×2): 1 mg via INTRAVENOUS
  Filled 2014-03-18 (×2): qty 1

## 2014-03-18 MED ORDER — ASPIRIN 325 MG PO TABS: 325.0000 mg | ORAL_TABLET | Freq: Four times a day (QID) | ORAL | Status: DC | PRN

## 2014-03-18 NOTE — ED Notes (Signed)
Still need Urine Specimen

## 2014-03-18 NOTE — ED Notes (Signed)
Per pt sts sent here by doctor with elevated WBC count. WBC 40. Pt having bilateral hip and back pain.

## 2014-03-18 NOTE — ED Notes (Signed)
Admitting MD at BS.  

## 2014-03-18 NOTE — ED Notes (Signed)
Family at bedside. 

## 2014-03-18 NOTE — ED Provider Notes (Signed)
CSN: [id number]     Arrival date & time 03/18/14  1444 History   First MD Initiated Contact with Patient 03/18/14 1524     Chief Complaint  Patient presents with  . Hip Pain     (Consider location/radiation/quality/duration/timing/severity/associated sxs/prior Treatment) Patient is a 68 y.o. male presenting with hip pain. The history is provided by the patient.  Hip Pain  He was sent to the ED because he had a very high white blood cell count and his doctor told him to come to the hospital because he might have leukemia. He is complaining of pain around his beltline which isn't present for the last month but seems to be getting worse. It is worse with movement. He rates his pain at 10/10. Nothing makes it any better. He denies any fever, chills, sweats. He denies any nausea, vomiting, diarrhea. He has not had any change in appetite and has not had nay weight loss. Denies any trauma. Of note, he had been in the hospital 2 weeks ago for evaluation of chest pain and had been found to have very high WBC at that time.  Past Medical History  Diagnosis Date  . Coronary artery disease   . Hypertension   . Hypercholesterolemia   . Stroke    Past Surgical History  Procedure Laterality Date  . Back surgery    . Hip arthroplasty     Family History  Problem Relation Age of Onset  . Diabetes Mother   . Diabetes Father    History  Substance Use Topics  . Smoking status: Current Every Day Smoker    Types: Cigarettes  . Smokeless tobacco: Not on file  . Alcohol Use: Yes    Review of Systems  All other systems reviewed and are negative.     Allergies  Review of patient's allergies indicates no known allergies.  Home Medications   Prior to Admission medications   Medication Sig Start Date End Date Taking? Authorizing Provider  aspirin 325 MG tablet Take 325 mg by mouth every 6 (six) hours as needed for moderate pain.    Historical Provider, MD  atorvastatin (LIPITOR) 40 MG tablet  Take 40 mg by mouth every morning.  03/02/14   Historical Provider, MD  carvedilol (COREG) 3.125 MG tablet Take 3.125 mg by mouth 2 (two) times daily. 03/02/14   Historical Provider, MD  lisinopril (PRINIVIL,ZESTRIL) 20 MG tablet Take 20 mg by mouth daily. 03/02/14   Historical Provider, MD   BP 161/85 mmHg  Temp(Src) 98.7 F (37.1 C) (Oral)  Resp 16  Ht 5\' 8"  (1.727 m)  Wt 194 lb (87.998 kg)  BMI 29.50 kg/m2  SpO2 98% Physical Exam  Nursing note and vitals reviewed.  68 year old male, resting comfortably and in no acute distress. Vital signs are significant for hypertension. Oxygen saturation is 98%, which is normal. Head is normocephalic and atraumatic. PERRLA, EOMI. Oropharynx is clear. Neck is nontender and supple without adenopathy or JVD. Back is nontender and there is no CVA tenderness. Lungs are clear without rales, wheezes, or rhonchi. Chest is nontender. Heart has regular rate and rhythm without murmur. Abdomen is soft, flat, with tenderness localized to the right lower quadrant. There is no rebound or guarding. There are no masses or hepatosplenomegaly and peristalsis is normoactive. Extremities have no cyanosis or edema, full range of motion is present. Specifically, there is full range of motion of the hips without pain and no tenderness to palpation over the hip joints. Skin  is warm and dry without rash. Neurologic: Mental status is normal, cranial nerves are intact, there are no motor or sensory deficits.  ED Course  Procedures (including critical care time) Labs Review Results for orders placed or performed during the hospital encounter of 03/18/14  CBC WITH DIFFERENTIAL  Result Value Ref Range   WBC 57.8 (HH) 4.0 - 10.5 K/uL   RBC 3.73 (L) 4.22 - 5.81 MIL/uL   Hemoglobin 11.2 (L) 13.0 - 17.0 g/dL   HCT 35.1 (L) 39.0 - 52.0 %   MCV 94.1 78.0 - 100.0 fL   MCH 30.0 26.0 - 34.0 pg   MCHC 31.9 30.0 - 36.0 g/dL   RDW 16.0 (H) 11.5 - 15.5 %   Platelets 824 (H) 150 -  400 K/uL   Neutrophils Relative % 81 (H) 43 - 77 %   Lymphocytes Relative 10 (L) 12 - 46 %   Monocytes Relative 5 3 - 12 %   Eosinophils Relative 2 0 - 5 %   Basophils Relative 2 (H) 0 - 1 %   Neutro Abs 46.7 (H) 1.7 - 7.7 K/uL   Lymphs Abs 5.8 (H) 0.7 - 4.0 K/uL   Monocytes Absolute 2.9 (H) 0.1 - 1.0 K/uL   Eosinophils Absolute 1.2 (H) 0.0 - 0.7 K/uL   Basophils Absolute 1.2 (H) 0.0 - 0.1 K/uL   RBC Morphology RARE NRBCs    WBC Morphology      MODERATE LEFT SHIFT (>5% METAS AND MYELOS,OCC PRO NOTED)  Comprehensive metabolic panel  Result Value Ref Range   Sodium 137 137 - 147 mEq/L   Potassium 4.2 3.7 - 5.3 mEq/L   Chloride 99 96 - 112 mEq/L   CO2 22 19 - 32 mEq/L   Glucose, Bld 205 (H) 70 - 99 mg/dL   BUN 13 6 - 23 mg/dL   Creatinine, Ser 1.04 0.50 - 1.35 mg/dL   Calcium 9.5 8.4 - 10.5 mg/dL   Total Protein 8.2 6.0 - 8.3 g/dL   Albumin 4.2 3.5 - 5.2 g/dL   AST 25 0 - 37 U/L   ALT 21 0 - 53 U/L   Alkaline Phosphatase 91 39 - 117 U/L   Total Bilirubin 0.2 (L) 0.3 - 1.2 mg/dL   GFR calc non Af Amer 72 (L) >90 mL/min   GFR calc Af Amer 83 (L) >90 mL/min   Anion gap 16 (H) 5 - 15  Urinalysis with microscopic  Result Value Ref Range   Color, Urine YELLOW YELLOW   APPearance CLEAR CLEAR   Specific Gravity, Urine 1.021 1.005 - 1.030   pH 5.5 5.0 - 8.0   Glucose, UA NEGATIVE NEGATIVE mg/dL   Hgb urine dipstick NEGATIVE NEGATIVE   Bilirubin Urine NEGATIVE NEGATIVE   Ketones, ur NEGATIVE NEGATIVE mg/dL   Protein, ur NEGATIVE NEGATIVE mg/dL   Urobilinogen, UA 1.0 0.0 - 1.0 mg/dL   Nitrite NEGATIVE NEGATIVE   Leukocytes, UA SMALL (A) NEGATIVE  Urine microscopic-add on  Result Value Ref Range   Squamous Epithelial / LPF RARE RARE   WBC, UA 3-6 <3 WBC/hpf   Bacteria, UA RARE RARE  Lactate dehydrogenase  Result Value Ref Range   LDH 526 (H) 94 - 250 U/L  I-Stat CG4 Lactic Acid, ED  Result Value Ref Range   Lactic Acid, Venous 1.43 0.5 - 2.2 mmol/L   Imaging Review Ct  Abdomen Pelvis W Contrast  03/18/2014   CLINICAL DATA:  Bilateral groin pain, elevated white blood cell count  EXAM: CT ABDOMEN AND PELVIS WITH CONTRAST  TECHNIQUE: Multidetector CT imaging of the abdomen and pelvis was performed using the standard protocol following bolus administration of intravenous contrast.  CONTRAST:  178mL OMNIPAQUE IOHEXOL 300 MG/ML  SOLN  COMPARISON:  None.  FINDINGS: The lung bases are free of acute infiltrate or sizable effusion.  The liver, gallbladder, spleen, adrenal glands and pancreas are all normal in their CT appearance with the exception of calcifications in the pancreatic head. The kidneys are well visualized bilaterally and reveal no renal calculi or obstructive changes.  The appendix is well visualized and within normal limits. The bladder is well distended. The prostate is unremarkable. Bony structures demonstrate postoperative change in the proximal right femur.  IMPRESSION: No acute abnormality in the abdomen and pelvis.   Electronically Signed   By: Inez Catalina M.D.   On: 03/18/2014 20:22   MDM   Final diagnoses:  RLQ abdominal pain  Leukocytosis  Thrombocytosis  Normocytic normochromic anemia  Elevated LDH    Right lower quadrant pain with marked leukocytosis. Old records are reviewed and he had WBC of 40-43,000 in the hospital but with a normal differential. He also had thrombocytosis noted at that time. This appears to be a leukemoid reaction as there is no report of abnormal cells on his peripheral smear. He'll be sent for CT of abdomen and pelvis to evaluate his right lower quadrant tenderness.  CT is unremarkable. WBC has actually increased to 57,800 and there is report of greater than 5% metamyelocytes and myelocytes with occasional promyelocytes. Platelet count is up to 824,000. This picture could be from a leukemoid reaction, the overall pattern seems more consistent with chronic myelogenous leukemia. LDH is noted to be elevated which would be more  consistent with leukemia. He needs bone marrow aspirate and biopsy for definitive diagnosis. I am concerned about whether he would go for outpatient follow-up since he had left AGAINST MEDICAL ADVICE previously. He is agreeable to stay to have additional testing done. I have contacted Dr. Terrence Dupont, who admitted him last time, who is agreeable to admitting him under observation status to have oncology see him to get bone marrow aspiration and biopsy done.  Delora Fuel, MD 14/43/15 [id number]

## 2014-03-18 NOTE — ED Notes (Signed)
The patient said he was told he has Leukemia sometime the week of Thanksgiving.  He was told he was "seriously ill".  The patient is here complaining of lower abdominal pain that radiates to his back. He is not complaining of anything else.

## 2014-03-18 NOTE — ED Notes (Signed)
Patient transported to CT 
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
## 2014-03-18 NOTE — ED Notes (Signed)
MD informed of pt's request for pain medication.

## 2014-03-19 DIAGNOSIS — R188 Other ascites: Secondary | ICD-10-CM

## 2014-03-19 DIAGNOSIS — D473 Essential (hemorrhagic) thrombocythemia: Secondary | ICD-10-CM

## 2014-03-19 DIAGNOSIS — R161 Splenomegaly, not elsewhere classified: Secondary | ICD-10-CM

## 2014-03-19 DIAGNOSIS — C9110 Chronic lymphocytic leukemia of B-cell type not having achieved remission: Secondary | ICD-10-CM

## 2014-03-19 DIAGNOSIS — D638 Anemia in other chronic diseases classified elsewhere: Secondary | ICD-10-CM

## 2014-03-19 DIAGNOSIS — I5022 Chronic systolic (congestive) heart failure: Secondary | ICD-10-CM

## 2014-03-19 LAB — BASIC METABOLIC PANEL
ANION GAP: 14 (ref 5–15)
BUN: 10 mg/dL (ref 6–23)
CALCIUM: 8.7 mg/dL (ref 8.4–10.5)
CHLORIDE: 102 meq/L (ref 96–112)
CO2: 25 meq/L (ref 19–32)
Creatinine, Ser: 1.07 mg/dL (ref 0.50–1.35)
GFR calc Af Amer: 80 mL/min — ABNORMAL LOW (ref >90)
GFR calc non Af Amer: 69 mL/min — ABNORMAL LOW (ref >90)
GLUCOSE: 105 mg/dL — AB (ref 70–99)
Potassium: 4 mEq/L (ref 3.7–5.3)
SODIUM: 141 meq/L (ref 137–147)

## 2014-03-19 LAB — CBC
HCT: 33.2 % — ABNORMAL LOW (ref 39.0–52.0)
HEMOGLOBIN: 10.6 g/dL — AB (ref 13.0–17.0)
MCH: 30.9 pg (ref 26.0–34.0)
MCHC: 31.9 g/dL (ref 30.0–36.0)
MCV: 96.8 fL (ref 78.0–100.0)
Platelets: 750 10*3/uL — ABNORMAL HIGH (ref 150–400)
RBC: 3.43 MIL/uL — AB (ref 4.22–5.81)
RDW: 16 % — ABNORMAL HIGH (ref 11.5–15.5)
WBC: 43.7 10*3/uL — AB (ref 4.0–10.5)

## 2014-03-19 LAB — URINE CULTURE
Colony Count: NO GROWTH
Culture: NO GROWTH

## 2014-03-19 LAB — CBC WITH DIFFERENTIAL/PLATELET
BASOS PCT: 2 % — AB (ref 0–1)
Basophils Absolute: 1.2 10*3/uL — ABNORMAL HIGH (ref 0.0–0.1)
EOS PCT: 2 % (ref 0–5)
Eosinophils Absolute: 1.2 10*3/uL — ABNORMAL HIGH (ref 0.0–0.7)
HCT: 35.1 % — ABNORMAL LOW (ref 39.0–52.0)
HEMOGLOBIN: 11.2 g/dL — AB (ref 13.0–17.0)
LYMPHS PCT: 10 % — AB (ref 12–46)
Lymphs Abs: 5.8 10*3/uL — ABNORMAL HIGH (ref 0.7–4.0)
MCH: 30 pg (ref 26.0–34.0)
MCHC: 31.9 g/dL (ref 30.0–36.0)
MCV: 94.1 fL (ref 78.0–100.0)
MONO ABS: 2.9 10*3/uL — AB (ref 0.1–1.0)
Monocytes Relative: 5 % (ref 3–12)
Neutro Abs: 46.7 10*3/uL — ABNORMAL HIGH (ref 1.7–7.7)
Neutrophils Relative %: 81 % — ABNORMAL HIGH (ref 43–77)
Platelets: 824 10*3/uL — ABNORMAL HIGH (ref 150–400)
RBC: 3.73 MIL/uL — AB (ref 4.22–5.81)
RDW: 16 % — ABNORMAL HIGH (ref 11.5–15.5)
WBC: 57.8 10*3/uL (ref 4.0–10.5)

## 2014-03-19 LAB — SAVE SMEAR

## 2014-03-19 MED ORDER — HEPARIN SODIUM (PORCINE) 5000 UNIT/ML IJ SOLN
5000.0000 [IU] | Freq: Three times a day (TID) | INTRAMUSCULAR | Status: DC
  Administered 2014-03-19 – 2014-03-21 (×3): 5000 [IU] via SUBCUTANEOUS
  Filled 2014-03-19 (×6): qty 1

## 2014-03-19 MED ORDER — WHITE PETROLATUM GEL
Status: AC
  Administered 2014-03-19: 10:00:00
  Filled 2014-03-19: qty 5

## 2014-03-19 NOTE — Progress Notes (Signed)
MD stated to order pt heart healthy diet if bone marrow biopsy would not be completed today. Graceann Congress

## 2014-03-19 NOTE — Progress Notes (Signed)
UR completed 

## 2014-03-19 NOTE — Plan of Care (Signed)
Problem: Phase I Progression Outcomes Goal: Pain controlled with appropriate interventions Outcome: Completed/Met Date Met:  03/19/14     
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
## 2014-03-19 NOTE — Consult Note (Signed)
[place]  Telephone:(336) [place] NOTE  Bruce [name]                                MR#: [id number]  DOB: [birth year]                       CSN#: [id number]  Referring MD:  Dr.  Terrence Dupont (Cardiology)  Patient Care Team: Elizabeth Palau, MD as PCP - General (General Surgery)   Consulting Provider: Dr. Burr Medico, Oncology  Reason for Consult: Leukocytosis and Thrombocytosis   Bruce [name] is a 68 y.o. male initially seen on 03/06/2014 for leukocytosis and thrombocytosis. Unfortunately, patient left AMA same day prior to further workup could be performed, including a bone marrow biopsy.   Before leaving, he was instructed to follow up with Oncology as soon as possible, due to suspicion of CML. Patient failed to do so.   He presented to the ED on 12/7 with bilateral hip and acute on chronic back pain. Films were negative for abnormalities. He denies any fever, chills or night sweats. He denies intermittent headaches, shortness of breath on exertion, or frequent leg cramps.There is no prior diagnosis of obstructive sleep apnea. The patient denies weight loss or skin itching. The patient is a smoker and currently smokes around half to 1 pack of cigarettes per day for the last 40 years. He never suffer from diagnosis of blood clot. He does carry a history of strokes twice, in the setting of hypercholesterolemia and hypertension. He denies any slurred speech or gait instability. No vision changes. He is on daily ASA. No NSAIDs. He also consumes significant amount of alcohol. Of note, he is a poor historian and his statements become tangential.  At the ED his WBC was 57,000 (ANC 46.7 abs, 5.8 lymphs and 2.9 monos), along with marked thrombocytosis at 824,000, now trending down to his prior visit values-  During his last admission with cardiac complaints, his CBC showed  WBC of 43.4. His platelet count was 775,000. He was placed on  Heparin per Pharmacy and ASA. These lab results did not change significantly during his short stay, suggesting chronic disease (CBC shows a WBC of 41k, with ANC 30.7, Lymphs 6.6 and Mono 2.1 with platelets 739,000). Of note, as of July of 2014, his CBC was unremarkable.-  We were kindly informed of the patient's admission in order to proceed with further workup and recommendations for treatment         PMH:  Past Medical History  Diagnosis Date  . Coronary artery disease   . Hypertension   . Hypercholesterolemia   . Stroke     Surgeries:  Past Surgical History  Procedure Laterality Date  . Back surgery    . Hip arthroplasty      Allergies: No Known Allergies  Medications:   Prior to Admission:  Prescriptions prior to admission  Medication Sig Dispense Refill Last Dose  . atorvastatin (LIPITOR) 40 MG tablet Take 40 mg by mouth every morning.   0 03/18/2014 at Unknown time  . carvedilol (COREG) 3.125 MG tablet Take 3.125 mg by mouth 2 (two) times daily.  0 03/18/2014 at 0800  . lisinopril (PRINIVIL,ZESTRIL) 20 MG tablet Take 20 mg by mouth daily.  0 03/18/2014 at Unknown time  . aspirin 325 MG tablet Take 325 mg by mouth every 6 (  six) hours as needed for moderate pain.   03/05/2014 at Unknown time   Scheduled Meds: . atorvastatin  40 mg Oral q morning - 10a  . carvedilol  3.125 mg Oral BID WC  . docusate sodium  100 mg Oral BID  . heparin  5,000 Units Subcutaneous 3 times per day  . lisinopril  5 mg Oral Daily   Continuous Infusions: . sodium chloride 75 mL/hr at 03/19/14 0004   PRN Meds:.aspirin, HYDROcodone-acetaminophen, senna-docusate   ROS: Constitutional: Denies fevers, chills or abnormal night sweats Eyes: Denies blurriness of vision, double vision or watery eyes Ears, nose, mouth, throat, and face: Denies mucositis or sore throat Respiratory: Denies cough, dyspnea or wheezes Cardiovascular: Denies palpitation, chest discomfort or lower extremity  swelling Gastrointestinal:  Denies nausea, heartburn or change in bowel habits Skin: Denies abnormal skin rashes Lymphatics: Denies new lymphadenopathy or easy bruising Neurological:Denies numbness, tingling or new weaknesses Musculoskeletal: Positive for back and hip pain, acute on chronic Behavioral/Psych: Mood is stable, no new changes  All other systems were reviewed with the patient and are negative.   Family History:    Family History  Problem Relation Age of Onset  . Diabetes Mother   . Diabetes Father     No family history of hematological  disorders.  Social History:  reports that he has been smoking Cigarettes.  He has been smoking about 0.00 packs per day. He does not have any smokeless tobacco history on file. He reports that he drinks alcohol. He reports that he does not use illicit drugs.   Physical Exam    ECOG PERFORMANCE STATUS: 2  Filed Vitals:   03/19/14 0932  BP: 122/67  Pulse:   Temp:   Resp:    Filed Weights   03/18/14 1451 03/18/14 2333  Weight: 194 lb (87.998 kg) 187 lb 1.6 oz (84.868 kg)    GENERAL:alert, no distress and comfortable SKIN: skin color, texture, turgor are normal, no rashes or significant lesions EYES: normal, conjunctiva are pink and non-injected, sclera clear OROPHARYNX:no exudate, no erythema and lips, buccal mucosa, and tongue normal  NECK: supple, thyroid normal size, non-tender, without nodularity LYMPH:  no palpable lymphadenopathy in the cervical, axillary or inguinal LUNGS: clear to auscultation and percussion with normal breathing effort HEART: regular rate & rhythm and 1/6 systolic murmur and no lower extremity edema ABDOMEN:abdomen soft, non-tender and normal bowel sounds Musculoskeletal:no cyanosis of digits and no clubbing  PSYCH: alert & oriented x 3 with fluent speech NEURO: no focal motor/sensory deficits   Labs:  CBC   Recent Labs Lab 03/18/14 1458 03/19/14 0635  WBC 57.8* 43.7*  HGB 11.2* 10.6*   HCT 35.1* 33.2*  PLT 824* 750*  MCV 94.1 96.8  MCH 30.0 30.9  MCHC 31.9 31.9  RDW 16.0* 16.0*  LYMPHSABS 5.8*  --   MONOABS 2.9*  --   EOSABS 1.2*  --   BASOSABS 1.2*  --      CMP    Recent Labs Lab 03/18/14 1458 03/19/14 0635  NA 137 141  K 4.2 4.0  CL 99 102  CO2 22 25  GLUCOSE 205* 105*  BUN 13 10  CREATININE 1.04 1.07  CALCIUM 9.5 8.7  AST 25  --   ALT 21  --   ALKPHOS 91  --   BILITOT 0.2*  --         Component Value Date/Time   BILITOT 0.2* 03/18/2014 1458     No results for input(s): INR,  PROTIME in the last 168 hours.  No results for input(s): DDIMER in the last 72 hours.   Anemia panel:  No results for input(s): VITAMINB12, FOLATE, FERRITIN, TIBC, IRON, RETICCTPCT in the last 72 hours.   Imaging Studies:  Dg Chest 2 View  03/05/2014   CLINICAL DATA:  Left arm numbness for 1 week.  EXAM: CHEST  2 VIEW  COMPARISON:  PA and lateral chest 10/30/2012.  FINDINGS: Heart size and mediastinal contours are within normal limits. Both lungs are clear. Visualized skeletal structures are unremarkable.  IMPRESSION: Negative exam.   Electronically Signed   By: Inge Rise M.D.   On: 03/05/2014 17:13   Dg Pelvis 1-2 Views  03/05/2014   CLINICAL DATA:  Chronic right hip pain  EXAM: PELVIS - 1-2 VIEW  COMPARISON:  None.  FINDINGS: A medullary rod is noted within the right femur. No acute fracture in the pelvis is seen. Degenerative changes of the hip joints are noted. No soft tissue changes are seen.  IMPRESSION: Postoperative change without acute abnormality.   Electronically Signed   By: Inez Catalina M.D.   On: 03/05/2014 17:14   Ct Head Wo Contrast  03/05/2014   CLINICAL DATA:  Left-sided weakness and numbness, slurred speech this morning. History of CVA.  EXAM: CT HEAD WITHOUT CONTRAST  TECHNIQUE: Contiguous axial images were obtained from the base of the skull through the vertex without intravenous contrast.  COMPARISON:  09/11/2010  FINDINGS:  Atherosclerotic and physiologic intracranial calcifications. Old bilateral nasal bone fractures. Mild atrophy.  There is no evidence of acute intracranial hemorrhage, brain edema, mass lesion, acute infarction, mass effect, or midline shift. Acute infarct may be inapparent on noncontrast CT. No other intra-axial abnormalities are seen, and the ventricles and sulci are within normal limits in size and symmetry. No abnormal extra-axial fluid collections or masses are identified. No significant calvarial abnormality.  IMPRESSION: 1. Negative for bleed or other acute intracranial process.   Electronically Signed   By: Arne Cleveland M.D.   On: 03/05/2014 16:49   Ct Abdomen Pelvis W Contrast  03/18/2014   CLINICAL DATA:  Bilateral groin pain, elevated white blood cell count  EXAM: CT ABDOMEN AND PELVIS WITH CONTRAST  TECHNIQUE: Multidetector CT imaging of the abdomen and pelvis was performed using the standard protocol following bolus administration of intravenous contrast.  CONTRAST:  144m OMNIPAQUE IOHEXOL 300 MG/ML  SOLN  COMPARISON:  None.  FINDINGS: The lung bases are free of acute infiltrate or sizable effusion.  The liver, gallbladder, spleen, adrenal glands and pancreas are all normal in their CT appearance with the exception of calcifications in the pancreatic head. The kidneys are well visualized bilaterally and reveal no renal calculi or obstructive changes.  The appendix is well visualized and within normal limits. The bladder is well distended. The prostate is unremarkable. Bony structures demonstrate postoperative change in the proximal right femur.  IMPRESSION: No acute abnormality in the abdomen and pelvis.   Electronically Signed   By: MInez CatalinaM.D.   On: 03/18/2014 20:22       Assessment/Plan: 68y.o. male admitted with    Possible Chronic Leukemia Patient was noted to have leukocytosis with differential demonstrating elevated lymphs and monocytes.  Leukemia is suspected.  bone  marrow biopsy with flow cytometry and cytogenetics if considered appropriate.  Smear has been ordered for review, rule out blasts in film. Check BCR-ABL and JAK-2 Phenotype Order written.  Thrombocytosis In the setting of tobacco habituation, anemia  and rule out bone marrow process Patient was on ASA daily prior to admission Check smear Bone marrow biopsy ordered as soon as possible.  Chronic Systolic Heart Failure  History of Ascites Splenomegaly  Ascites unlikely due to cirrhosis but to chronic systolic heart failure therapeutic paracentesis as needed  Anemia This is likely anemia of chronic disease Patient denies any recent history of bleeding such as epistaxis, hematuria or hematochezia asymptomatic for anemia Will observe for now transfuse for Hb of 7  WERTMAN,SARA E, PA-C 03/19/2014 12:27 PM   Addendum I have seen the patient, examined him. I agree with the assessment and and plan and have edited the notes.   68 yo AAM with PMH of hypertension, coronary artery disease, stroke, back and hip surgery, was recently admitted to [facility] for chest pain and hip pain. He had a normal CBC in July 2014, and was found to have elevated WBC in the range of 40-50 8K, somewhat cytosis with platelet count 770-820K, along with normocytic mild anemia.  I have reviewed his peripheral blood smear with our pathologist Dr. Gari Crown this afternoon. It showed significant elevated neutrophils, with majority mature neutrophils, increased myelocytes, eosinophils and basophils, no significant blasts. The morphology of RBC and platelet are unremarkable. This is most consistent with CML, also other myeloproliferative disorder also possible.   He is scheduled to have a CT-guided bone marrow biopsy by IR tomorrow, I have ordered a BCR-ABL PCR, and JAK2 mutation on his peripheral blood. I will see him back in my clinic in 2-3 weeks to review his above test results and this makes a final  recommendation for his treatment. I will arrange his appointments, I also give him my contact information to call if he has any questions.  I recommend aspirin 325 mg daily to reduce his risk of thrombosis.  Truitt Merle MD  03/19/2014

## 2014-03-19 NOTE — Consult Note (Signed)
Chief Complaint: Chief Complaint  Patient presents with  . Hip Pain  leukocytosis; thrombocytosis  Referring Physician(s): Dr Burr Medico; Dr Terrence Dupont  History of Present Illness: Bruce [name] is a 68 y.o. male  Pt with B hip and back pain Admitted through ED for pain Labs show significant leukocytosis and thrombocytosis Oncology has consulted and has asked for IR Bone Marrow bx Suspicious for leukemia   Past Medical History  Diagnosis Date  . Coronary artery disease   . Hypertension   . Hypercholesterolemia   . Stroke     Past Surgical History  Procedure Laterality Date  . Back surgery    . Hip arthroplasty      Allergies: Review of patient's allergies indicates no known allergies.  Medications: Prior to Admission medications   Medication Sig Start Date End Date Taking? Authorizing Provider  atorvastatin (LIPITOR) 40 MG tablet Take 40 mg by mouth every morning.  03/02/14  Yes Historical Provider, MD  carvedilol (COREG) 3.125 MG tablet Take 3.125 mg by mouth 2 (two) times daily. 03/02/14  Yes Historical Provider, MD  lisinopril (PRINIVIL,ZESTRIL) 20 MG tablet Take 20 mg by mouth daily. 03/02/14  Yes Historical Provider, MD  aspirin 325 MG tablet Take 325 mg by mouth every 6 (six) hours as needed for moderate pain.    Historical Provider, MD    Family History  Problem Relation Age of Onset  . Diabetes Mother   . Diabetes Father     History   Social History  . Marital Status: Widowed    Spouse Name: N/A    Number of Children: N/A  . Years of Education: N/A   Social History Main Topics  . Smoking status: Current Every Day Smoker    Types: Cigarettes  . Smokeless tobacco: None  . Alcohol Use: Yes  . Drug Use: No  . Sexual Activity: None   Other Topics Concern  . None   Social History Narrative     Review of Systems: A 12 point ROS discussed and pertinent positives are indicated in the HPI above.  All other systems are negative.  Review of  Systems  Constitutional: Positive for activity change and fatigue.  Respiratory: Negative for cough and shortness of breath.   Gastrointestinal: Negative for abdominal pain.  Genitourinary: Negative for difficulty urinating.  Musculoskeletal: Positive for myalgias, back pain and gait problem.  Neurological: Positive for weakness.  Psychiatric/Behavioral: Negative for behavioral problems and confusion.    Vital Signs: BP 144/63 mmHg  Pulse 69  Temp(Src) 98 F (36.7 C) (Axillary)  Resp 18  Ht 5\' 8"  (1.727 m)  Wt 84.868 kg (187 lb 1.6 oz)  BMI 28.46 kg/m2  SpO2 100%  Physical Exam  Constitutional: He is oriented to person, place, and time.  Cardiovascular: Normal rate and regular rhythm.   No murmur heard. Pulmonary/Chest: Breath sounds normal. He has no wheezes.  Abdominal: Soft. Bowel sounds are normal. There is no tenderness.  Musculoskeletal: Normal range of motion.  Back pain; leg pain  Neurological: He is alert and oriented to person, place, and time.  Skin: Skin is warm and dry.  Psychiatric: He has a normal mood and affect. His behavior is normal. Judgment and thought content normal.  Nursing note and vitals reviewed.   Imaging: Dg Chest 2 View  03/05/2014   CLINICAL DATA:  Left arm numbness for 1 week.  EXAM: CHEST  2 VIEW  COMPARISON:  PA and lateral chest 10/30/2012.  FINDINGS: Heart size and mediastinal  contours are within normal limits. Both lungs are clear. Visualized skeletal structures are unremarkable.  IMPRESSION: Negative exam.   Electronically Signed   By: Inge Rise M.D.   On: 03/05/2014 17:13   Dg Pelvis 1-2 Views  03/05/2014   CLINICAL DATA:  Chronic right hip pain  EXAM: PELVIS - 1-2 VIEW  COMPARISON:  None.  FINDINGS: A medullary rod is noted within the right femur. No acute fracture in the pelvis is seen. Degenerative changes of the hip joints are noted. No soft tissue changes are seen.  IMPRESSION: Postoperative change without acute abnormality.    Electronically Signed   By: Inez Catalina M.D.   On: 03/05/2014 17:14   Ct Head Wo Contrast  03/05/2014   CLINICAL DATA:  Left-sided weakness and numbness, slurred speech this morning. History of CVA.  EXAM: CT HEAD WITHOUT CONTRAST  TECHNIQUE: Contiguous axial images were obtained from the base of the skull through the vertex without intravenous contrast.  COMPARISON:  09/11/2010  FINDINGS: Atherosclerotic and physiologic intracranial calcifications. Old bilateral nasal bone fractures. Mild atrophy.  There is no evidence of acute intracranial hemorrhage, brain edema, mass lesion, acute infarction, mass effect, or midline shift. Acute infarct may be inapparent on noncontrast CT. No other intra-axial abnormalities are seen, and the ventricles and sulci are within normal limits in size and symmetry. No abnormal extra-axial fluid collections or masses are identified. No significant calvarial abnormality.  IMPRESSION: 1. Negative for bleed or other acute intracranial process.   Electronically Signed   By: Arne Cleveland M.D.   On: 03/05/2014 16:49   Ct Abdomen Pelvis W Contrast  03/18/2014   CLINICAL DATA:  Bilateral groin pain, elevated white blood cell count  EXAM: CT ABDOMEN AND PELVIS WITH CONTRAST  TECHNIQUE: Multidetector CT imaging of the abdomen and pelvis was performed using the standard protocol following bolus administration of intravenous contrast.  CONTRAST:  161mL OMNIPAQUE IOHEXOL 300 MG/ML  SOLN  COMPARISON:  None.  FINDINGS: The lung bases are free of acute infiltrate or sizable effusion.  The liver, gallbladder, spleen, adrenal glands and pancreas are all normal in their CT appearance with the exception of calcifications in the pancreatic head. The kidneys are well visualized bilaterally and reveal no renal calculi or obstructive changes.  The appendix is well visualized and within normal limits. The bladder is well distended. The prostate is unremarkable. Bony structures demonstrate  postoperative change in the proximal right femur.  IMPRESSION: No acute abnormality in the abdomen and pelvis.   Electronically Signed   By: Inez Catalina M.D.   On: 03/18/2014 20:22    Labs:  CBC:  Recent Labs  03/05/14 1757 03/06/14 0306 03/18/14 1458 03/19/14 0635  WBC 40.7* 41.0* 57.8* 43.7*  HGB 10.7* 10.2* 11.2* 10.6*  HCT 33.5* 31.5* 35.1* 33.2*  PLT 773* 739* 824* 750*    COAGS: No results for input(s): INR, APTT in the last 8760 hours.  BMP:  Recent Labs  03/05/14 1515 03/06/14 0306 03/18/14 1458 03/19/14 0635  NA 141 139 137 141  K 4.0 3.9 4.2 4.0  CL 102 102 99 102  CO2 26 27 22 25   GLUCOSE 97 98 205* 105*  BUN 15 14 13 10   CALCIUM 9.7 8.7 9.5 8.7  CREATININE 1.11 1.11 1.04 1.07  GFRNONAA 66* 66* 72* 69*  GFRAA 77* 77* 83* 80*    LIVER FUNCTION TESTS:  Recent Labs  03/05/14 1515 03/18/14 1458  BILITOT 0.3 0.2*  AST 16 25  ALT 15 21  ALKPHOS 89 91  PROT 8.1 8.2  ALBUMIN 4.4 4.2    TUMOR MARKERS: No results for input(s): AFPTM, CEA, CA199, CHROMGRNA in the last 8760 hours.  Assessment and Plan:  Back and hip pain Leukocytosis; thrombocytosis Suspicious for leukemia Now scheduled for BM bx 12/9 in Radiology Hold Hep inj 12/9 Pt aware of procedure benefits and risks and agreeable to proceed Consent signed andin chart  Thank you for this interesting consult.  I greatly enjoyed meeting SOHRAB KEELAN and look forward to participating in their care.     I spent a total of 20 minutes face to face in clinical consultation, greater than 50% of which was counseling/coordinating care for BM bx  Signed: Meryn Sarracino A 03/19/2014, 3:37 PM

## 2014-03-19 NOTE — Progress Notes (Signed)
IR called and stated biopsy will not be completed today because specialist from [facility] does not come to [facility] for biopsy orders after lunch time.  MD Burr Medico ordered to get bone marrow biopsy completed in the morning.  Will place diet order. Graceann Congress

## 2014-03-20 ENCOUNTER — Observation Stay (HOSPITAL_COMMUNITY): Payer: PRIVATE HEALTH INSURANCE

## 2014-03-20 LAB — BONE MARROW EXAM

## 2014-03-20 LAB — APTT: APTT: 31 s (ref 24–37)

## 2014-03-20 LAB — PROTIME-INR
INR: 1.05 (ref 0.00–1.49)
Prothrombin Time: 13.9 seconds (ref 11.6–15.2)

## 2014-03-20 MED ORDER — MIDAZOLAM HCL 5 MG/5ML IJ SOLN: INTRAMUSCULAR | Status: AC | PRN

## 2014-03-20 MED ORDER — FENTANYL CITRATE 0.05 MG/ML IJ SOLN
INTRAMUSCULAR | Status: AC
  Administered 2014-03-20: 11:00:00
  Filled 2014-03-20: qty 4

## 2014-03-20 MED ORDER — LIDOCAINE HCL 1 % IJ SOLN
INTRAMUSCULAR | Status: AC
  Administered 2014-03-20: 11:00:00
  Filled 2014-03-20: qty 20

## 2014-03-20 MED ORDER — MIDAZOLAM HCL 2 MG/2ML IJ SOLN
INTRAMUSCULAR | Status: AC
  Administered 2014-03-20: 11:00:00
  Filled 2014-03-20: qty 4

## 2014-03-20 MED ORDER — FENTANYL CITRATE 0.05 MG/ML IJ SOLN
INTRAMUSCULAR | Status: AC | PRN
  Administered 2014-03-20 (×2): 50 ug via INTRAVENOUS

## 2014-03-20 MED ORDER — HYDROCODONE-ACETAMINOPHEN 5-325 MG PO TABS
1.0000 | ORAL_TABLET | ORAL | Status: DC | PRN
  Administered 2014-03-21 (×3): 2 via ORAL
  Filled 2014-03-20 (×3): qty 2

## 2014-03-20 MED ORDER — MIDAZOLAM HCL 2 MG/2ML IJ SOLN
INTRAMUSCULAR | Status: AC | PRN
  Administered 2014-03-20 (×3): 1 mg via INTRAVENOUS
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
## 2014-03-20 NOTE — Sedation Documentation (Signed)
States just his usual pain to back,9/10

## 2014-03-20 NOTE — Procedures (Signed)
CT-guided LEFT iliac bone marrow aspiration and core biopsy No complication No blood loss. See complete dictation in [facility] P[facility]  

## 2014-03-20 NOTE — Progress Notes (Signed)
UR completed 

## 2014-03-20 NOTE — Plan of Care (Signed)
Problem: Phase I Progression Outcomes Goal: OOB as tolerated unless otherwise ordered Outcome: Completed/Met Date Met:  03/20/14

## 2014-03-20 NOTE — Plan of Care (Signed)
Problem: Phase I Progression Outcomes Goal: OOB as tolerated unless otherwise ordered Outcome: Progressing     

## 2014-03-21 LAB — BASIC METABOLIC PANEL
Anion gap: 10 (ref 5–15)
BUN: 10 mg/dL (ref 6–23)
CALCIUM: 8.9 mg/dL (ref 8.4–10.5)
CO2: 27 mEq/L (ref 19–32)
CREATININE: 1.11 mg/dL (ref 0.50–1.35)
Chloride: 102 mEq/L (ref 96–112)
GFR calc Af Amer: 77 mL/min — ABNORMAL LOW (ref >90)
GFR, EST NON AFRICAN AMERICAN: 66 mL/min — AB (ref >90)
GLUCOSE: 104 mg/dL — AB (ref 70–99)
Potassium: 4 mEq/L (ref 3.7–5.3)
Sodium: 139 mEq/L (ref 137–147)

## 2014-03-21 LAB — CBC
HEMATOCRIT: 31.1 % — AB (ref 39.0–52.0)
Hemoglobin: 9.7 g/dL — ABNORMAL LOW (ref 13.0–17.0)
MCH: 29.9 pg (ref 26.0–34.0)
MCHC: 31.2 g/dL (ref 30.0–36.0)
MCV: 96 fL (ref 78.0–100.0)
Platelets: 711 10*3/uL — ABNORMAL HIGH (ref 150–400)
RBC: 3.24 MIL/uL — ABNORMAL LOW (ref 4.22–5.81)
RDW: 16.1 % — ABNORMAL HIGH (ref 11.5–15.5)
WBC: 34.3 10*3/uL — ABNORMAL HIGH (ref 4.0–10.5)

## 2014-03-21 MED ORDER — CLOPIDOGREL BISULFATE 75 MG PO TABS: 75.0000 mg | ORAL_TABLET | Freq: Every day | ORAL | Status: DC

## 2014-03-21 NOTE — Discharge Summary (Signed)
NAMEVISHWA, Bruce [name]             ACCOUNT NO.:  [id number]  MEDICAL RECORD NO.:  [id number]  LOCATION:  [id number]                        FACILITY:  [facility]  PHYSICIAN:  Daya Dutt N. Terrence Dupont, M.D. DATE OF BIRTH:  May 16, 1945  DATE OF ADMISSION:  03/18/2014 DATE OF DISCHARGE:  03/18/2014                              DISCHARGE SUMMARY   ADMITTING DIAGNOSES: 1. Back and bilateral hip pain associated with marked leukocytosis,     rule out leukemia. 2. Marked thrombocytosis, etiology unclear. 3. Hypertension. 4. Status post atypical chest pain. 5. History of mild coronary artery disease, nonischemic     cardiomyopathy.  EF approximately 40%.  Asymptomatic left     ventricular dysfunction. 6. Tobacco abuse. 7. History of cerebrovascular accident x2 in the past. 8. History of alcohol abuse. 9. Anemia of chronic disease.  DISCHARGE DIAGNOSES: 1. Status post back and hip pain. 2. Status post CT-guided bone marrow aspiration and core biopsy. 3. Marked thrombocytosis. 4. Hypertension. 5. Nonischemic cardiomyopathy. 6. Asymptomatic left ventricular dysfunction. 7. Mild coronary artery disease. 8. Status post atypical chest pain. 9. History of cerebrovascular accident x2. 10.Tobacco abuse. 11.History of alcohol abuse. 12.Anemia of chronic disease.  MEDICATIONS:  Discharge home medications are clopidogrel 75 mg 1 tablet daily, Plavix 325 mg 1 tablet daily, atorvastatin 40 mg 1 tablet daily, carvedilol 3.125 mg twice daily, lisinopril 20 mg daily.  DIET:  Low salt, low cholesterol.  ACTIVITY:  As tolerated.  FOLLOWUP:  Follow up with me in 2 weeks.  Follow up with Heme/Onc as scheduled.  CONDITION AT DISCHARGE:  Stable.  BRIEF HISTORY:  Bruce [name] is a 68 year old male with past medical history significant for mild coronary artery disease, nonischemic cardiomyopathy, EF approximately 40%, hypertension, history of tobacco abuse, history of alcohol abuse, degenerative joint disease,  history of CVA x2, marked leukocytosis/thrombocytosis, recently discharged from the hospital against medical advice, refused for further work up.  He came back to the ER complaining of back and hip pain.  The patient was noted to have progressive increasing white cell count of 57,000 associated with marked thrombocytosis.  The patient was scheduled for bone marrow biopsy approximately 2 weeks ago but signed out against medical advice. The patient presently denies any chest pain, nausea, vomiting, diaphoresis.  Denies any headaches.  Denies seizure activity.  Denies any weakness in the arms or legs.  Denies any slurred speech.  Denies any recent weight loss.  Denies any blurring of vision.  Denies cough or fever or chills.  PHYSICAL EXAMINATION:  GENERAL:  On examination, he was alert, awake, oriented x3. VITAL SIGNS:  Blood pressure was 142/60, pulse was 74.  He was afebrile. HEENT:  Conjunctivae were pink. NECK:  Supple.  No JVD.  No bruit. LUNGS:  Clear to auscultation without rhonchi or rales. CARDIOVASCULAR:  S1, S2 was normal.  There was soft systolic murmur.  No S3, gallop noted. ABDOMEN:  Soft.  Bowel sounds were present.  Nontender. EXTREMITIES:  There is no clubbing, cyanosis, or edema.  LABORATORY DATA:  Sodium was 137, potassium 4.2, BUN 13, creatinine 1.04.  Blood sugar was 205, repeat fasting sugar was 105 and 104. Hemoglobin was 11.2, hematocrit 35.1, white count of 57,800,  platelet count was 824,000.  Three sets of cardiac enzymes were negative.  LDH was high at 526.  The patient had CT of the abdomen in the ED which was essentially normal.  BRIEF HOSPITAL COURSE:  The patient was admitted to Med/Surg regular floor.  Heme/Onc consultation was obtained.  The patient was hydrated and was started on aspirin and followed by Plavix.  The patient subsequently underwent CT-guided bone marrow aspiration and cold biopsy; he tolerated the procedure well by Interventional  Radiology.  Further workup from Heme/Onc point of view will be done as outpatient.  The patient will be followed up in my office in 2 weeks and will be followed by Heme/Onc as scheduled once the results of bone marrow biopsy are available.     Allegra Lai. Terrence Dupont, M.D.     MNH/MEDQ  D:  03/21/2014  T:  03/21/2014  Job:  [id number]

## 2014-03-21 NOTE — Clinical Social Work Note (Signed)
RN informed CSW that patient would like [facility] transportation home from the hospital.  CSW provided SCAT application for patient and informed RN that [employer] would not be available immediately for patient use.  CSW called Medicaid Transport to set up ride for patient- informed that patient may not be eligible based on Medicaid type- transferred to a worker to fill out a 70 47 form- left message.  CSW will continue to follow.  Domenica Reamer, [place] Social Worker [phone]

## 2014-03-21 NOTE — Discharge Summary (Signed)
Discharge summary dictated on 03/21/2014 dictation number is [id number]

## 2014-03-21 NOTE — Discharge Instructions (Signed)
Leukocytosis °Leukocytosis means you have more white blood cells than normal. White blood cells are made in your bone marrow. The main job of white blood cells is to fight infection. Having too many white blood cells is a common condition. It can develop as a result of many types of medical problems. °CAUSES  °In some cases, your bone marrow may be normal, but it is still making too many white blood cells. This could be the result of: °· Infection. °· Injury. °· Physical stress. °· Emotional stress. °· Surgery. °· Allergic reactions. °· Tumors that do not start in the blood or bone marrow. °· An inherited disease. °· Certain medicines. °· Pregnancy and labor. °In other cases, you may have a bone marrow disorder that is causing your body to make too many white blood cells. Bone marrow disorders include: °· Leukemia. This is a type of blood cancer. °· Myeloproliferative disorders. These disorders cause blood cells to grow abnormally. °SYMPTOMS  °Some people have no symptoms. Others have symptoms due to the medical problem that is causing their leukocytosis. These symptoms may include: °· Bleeding. °· Bruising. °· Fever. °· Night sweats. °· Repeated infections. °· Weakness. °· Weight loss. °DIAGNOSIS  °Leukocytosis is often found during blood tests that are done as part of a normal physical exam. Your caregiver will probably order other tests to help determine why you have too many white blood cells. These tests may include: °· A complete blood count (CBC). This test measures all the types of blood cells in your body. °· Chest X-rays, urine tests (urinalysis), or other tests to look for signs of infection. °· Bone marrow aspiration. For this test, a needle is put into your bone. Cells from the bone marrow are removed through the needle. The cells are then examined under a microscope. °TREATMENT  °Treatment is usually not needed for leukocytosis. However, if a disorder is causing your leukocytosis, it will need to be  treated. Treatment may include: °· Antibiotic medicines if you have a bacterial infection. °· Bone marrow transplant. Your diseased bone marrow is replaced with healthy cells that will grow new bone marrow. °· Chemotherapy. This is the use of drugs to kill cancer cells. °HOME CARE INSTRUCTIONS °· Only take over-the-counter or prescription medicines as directed by your caregiver. °· Maintain a healthy weight. Ask your caregiver what weight is best for you. °· Eat foods that are low in saturated fats and high in fiber. Eat plenty of fruits and vegetables. °· Drink enough fluids to keep your urine clear or pale yellow. °· Get 30 minutes of exercise at least 5 times a week. Check with your caregiver before starting a new exercise routine. °· Limit caffeine and alcohol. °· Do not smoke. °· Keep all follow-up appointments as directed by your caregiver. °SEEK MEDICAL CARE IF: °· You feel weak or more tired than usual. °· You develop chills, a cough, or nasal congestion. °· You lose weight without trying. °· You have night sweats. °· You bruise easily. °SEEK IMMEDIATE MEDICAL CARE IF: °· You bleed more than normal. °· You have chest pain. °· You have trouble breathing. °· You have a fever. °· You have uncontrolled nausea or vomiting. °· You feel dizzy or lightheaded. °MAKE SURE YOU: °· Understand these instructions. °· Will watch your condition. °· Will get help right away if you are not doing well or get worse. °Document Released: 03/18/2011 Document Revised: 06/21/2011 Document Reviewed: 03/18/2011 °Exit[employer]® Patient Information ©2015 [employer]. This information is   not intended to replace advice given to you by your health care provider. Make sure you discuss any questions you have with your health care provider.

## 2014-03-24 LAB — CULTURE, BLOOD (ROUTINE X 2)
Culture: NO GROWTH
Culture: NO GROWTH

## 2014-03-24 LAB — BCR/ABL GENE REARRANGEMENT QNT, PCR: BCR ABL1 / ABL1 IS: 69.154 % — ABNORMAL HIGH

## 2014-03-24 LAB — P210 BCR-ABL 1: P210 BCR ABL1: DETECTED

## 2014-03-24 LAB — P190 BCR-ABL 1: P190 BCR ABL1: NOT DETECTED

## 2014-03-25 ENCOUNTER — Telehealth: Payer: Self-pay | Admitting: Hematology

## 2014-03-25 NOTE — Telephone Encounter (Signed)
left message for patient and gave hosp f/u appt for 12/16 @ 3:30 w/Dr. Burr Medico. Left message with patient friend and gave hosp f/u appt for 12/16 @ 3:30 w/dr. Burr Medico

## 2014-03-27 ENCOUNTER — Encounter: Payer: Self-pay | Admitting: Hematology

## 2014-03-27 ENCOUNTER — Ambulatory Visit: Payer: PRIVATE HEALTH INSURANCE

## 2014-03-27 ENCOUNTER — Ambulatory Visit (HOSPITAL_BASED_OUTPATIENT_CLINIC_OR_DEPARTMENT_OTHER): Payer: PRIVATE HEALTH INSURANCE | Admitting: Hematology

## 2014-03-27 ENCOUNTER — Telehealth: Payer: Self-pay | Admitting: Hematology

## 2014-03-27 VITALS — BP 149/68 | HR 18 | Temp 98.4°F | Resp 18 | Ht 68.0 in | Wt 191.9 lb

## 2014-03-27 DIAGNOSIS — E780 Pure hypercholesterolemia: Secondary | ICD-10-CM

## 2014-03-27 DIAGNOSIS — C9210 Chronic myeloid leukemia, BCR/ABL-positive, not having achieved remission: Secondary | ICD-10-CM | POA: Insufficient documentation

## 2014-03-27 DIAGNOSIS — I10 Essential (primary) hypertension: Secondary | ICD-10-CM

## 2014-03-27 LAB — JAK2 GENOTYPR: JAK2 GenotypR: NOT DETECTED

## 2014-03-27 MED ORDER — IMATINIB MESYLATE 400 MG PO TABS: 400.0000 mg | ORAL_TABLET | Freq: Every day | ORAL | Status: DC

## 2014-03-27 NOTE — Progress Notes (Signed)
Checked in new pt with no financial concerns at this time.  Pt has 2 insurances so financial assistance may not be needed but he has [employer]quel's card for any billing or insurance questions or concerns.  ° °

## 2014-03-27 NOTE — Telephone Encounter (Signed)
Gave avs & cal for Jan. °

## 2014-03-27 NOTE — Progress Notes (Signed)
Bruce [name]  Telephone:(336) [phone] Fax:(336) [phone]  Clinic New Consult Note   Patient Care Team: Elizabeth Palau, MD as PCP - General (General Surgery) 03/27/2014  CHIEF COMPLAINTS/PURPOSE OF CONSULTATION:  Newly diagnosed CML, chronic phase     CML (chronic myelocytic leukemia)   03/06/2014 Initial Diagnosis CML (chronic myelocytic leukemia), WBC 57K with ANC 46.7K, plt 824K.    03/19/2014 Miscellaneous Peripheral blood PCR/ABL P210 (+), P190(-), PCR IS 69%.    03/20/2014 Bone Marrow Biopsy hypercellular marrow, c/w CML, no blasts, FISH(+) t(9,22)      HISTORY OF PRESENTING ILLNESS:  Bruce [name] 68 y.o. male with newly diagnosed CML, chronic phase. I initially saw him in the hospital, he is here for follow-up.   He presented to [facility] ED with chest pain, left arm numbness on 03/06/2014, and was found to have leukocytosis with WBC 40 3.4K, ANC 30 4.3K, hemoglobin 11.7, and thrombocytosis with platelet count 775K. His cardiac workup was negative. Hematology was consulted,  But pt left AMA same day prior to further workup could be performed, including a bone marrow biopsy.  He again presented to the ED on 12/7 with bilateral hip and acute on chronic back pain. At the ED his WBC was 57,000 (ANC 46.7 abs, 5.8 lymphs and 2.9 monos), along with marked thrombocytosis at 824,000. I saw him in the hospital and recommended a bone marrow biopsy, which was done on 03/20/2014. I reviewed his peripheral smear with the pathologist Dr. Gari Crown, and both of [place] felt he is on peripheral smear was most likely consistent with myeloproliferative neoplasm, likely CML.   He has chronic right hip pain and low back pain, he had right hip surgery due to his injury from his work in 1996. He otherwise feels well, denies other pain, fever, chills, night sweats or other symptoms.  He does carry a history of strokes twice, in the setting of hypercholesterolemia and hypertension. He denies  any slurred speech or gait instability. No vision changes. He is on daily ASA. No NSAIDs. He also consumes significant amount of alcohol.   MEDICAL HISTORY:  Past Medical History  Diagnosis Date  . Coronary artery disease   . Hypertension   . Hypercholesterolemia   . Stroke     SURGICAL HISTORY: Past Surgical History  Procedure Laterality Date  . Back surgery    . Hip arthroplasty      SOCIAL HISTORY: History   Social History  . Marital Status: Widowed    Spouse Name: N/A    Number of Children: No   . Years of Education: N/A   Occupational History  . Retired, used to work as a Well[employer] geologist    Social History Main Topics  . Smoking status: Current Every Day Smoker    Types: Cigarettes  . Smokeless tobacco: Not on file  . Alcohol Use: Yes  . Drug Use: No  . Sexual Activity: Not on file   Other Topics Concern  . Not on file   Social History Narrative    FAMILY HISTORY: Family History  Problem Relation Age of Onset  . Diabetes Mother   . Diabetes Father     ALLERGIES:  has No Known Allergies.  MEDICATIONS:  Current Outpatient Prescriptions  Medication Sig Dispense Refill  . atorvastatin (LIPITOR) 40 MG tablet Take 40 mg by mouth every morning.   0  . clopidogrel (PLAVIX) 75 MG tablet Take 1 tablet (75 mg total) by mouth daily. 30 tablet 3  .  lisinopril (PRINIVIL,ZESTRIL) 20 MG tablet Take 20 mg by mouth daily.  0  . oxyCODONE-acetaminophen (PERCOCET) 10-325 MG per tablet Take 1 tablet by mouth 2 (two) times daily.  0  . aspirin 325 MG tablet Take 325 mg by mouth every 6 (six) hours as needed for moderate pain.    . carvedilol (COREG) 3.125 MG tablet Take 3.125 mg by mouth 2 (two) times daily.  0  . imatinib (GLEEVEC) 400 MG tablet Take 1 tablet (400 mg total) by mouth daily. Take with meals and large glass of water.Caution:Chemotherapy. 30 tablet 5   No current facility-administered medications for this visit.    REVIEW OF SYSTEMS:   Constitutional:  Denies fevers, chills or abnormal night sweats Eyes: Denies blurriness of vision, double vision or watery eyes Ears, nose, mouth, throat, and face: Denies mucositis or sore throat Respiratory: Denies cough, dyspnea or wheezes Cardiovascular: Denies palpitation, chest discomfort or lower extremity swelling Gastrointestinal:  Denies nausea, heartburn or change in bowel habits Skin: Denies abnormal skin rashes Lymphatics: Denies new lymphadenopathy or easy bruising Neurological:Denies numbness, tingling or new weaknesses Behavioral/Psych: Mood is stable, no new changes  All other systems were reviewed with the patient and are negative.  PHYSICAL EXAMINATION: ECOG PERFORMANCE STATUS: 1 - Symptomatic but completely ambulatory  Filed Vitals:   03/27/14 1549  BP: 149/68  Pulse: 18  Temp: 98.4 F (36.9 C)  Resp: 18   Filed Weights   03/27/14 1549  Weight: 191 lb 14.4 oz (87.045 kg)    GENERAL:alert, no distress and comfortable SKIN: skin color, texture, turgor are normal, no rashes or significant lesions EYES: normal, conjunctiva are pink and non-injected, sclera clear OROPHARYNX:no exudate, no erythema and lips, buccal mucosa, and tongue normal  NECK: supple, thyroid normal size, non-tender, without nodularity LYMPH:  no palpable lymphadenopathy in the cervical, axillary or inguinal LUNGS: clear to auscultation and percussion with normal breathing effort HEART: regular rate & rhythm and no murmurs and no lower extremity edema ABDOMEN:abdomen soft, non-tender and normal bowel sounds Musculoskeletal:no cyanosis of digits and no clubbing  PSYCH: alert & oriented x 3 with fluent speech NEURO: no focal motor/sensory deficits  LABORATORY DATA:  I have reviewed the data as listed Lab Results  Component Value Date   WBC 34.3* 03/21/2014   HGB 9.7* 03/21/2014   HCT 31.1* 03/21/2014   MCV 96.0 03/21/2014   PLT 711* 03/21/2014    Recent Labs  03/05/14 1515  03/18/14 1458  03/19/14 0635 03/21/14 0351  NA 141  < > 137 141 139  K 4.0  < > 4.2 4.0 4.0  CL 102  < > 99 102 102  CO2 26  < > 22 25 27   GLUCOSE 97  < > 205* 105* 104*  BUN 15  < > 13 10 10   CREATININE 1.11  < > 1.04 1.07 1.11  CALCIUM 9.7  < > 9.5 8.7 8.9  GFRNONAA 66*  < > 72* 69* 66*  GFRAA 77*  < > 83* 80* 77*  PROT 8.1  --  8.2  --   --   ALBUMIN 4.4  --  4.2  --   --   AST 16  --  25  --   --   ALT 15  --  21  --   --   ALKPHOS 89  --  91  --   --   BILITOT 0.3  --  0.2*  --   --   < > =  values in this interval not displayed.  Bcr/abl gene rearrangement qnt, PCR (Order [id number])      Bcr/abl gene rearrangement qnt, PCR  Status: Finalresult Visible to patient:  Not Released Nextappt: 04/22/2014 at 10:15 AM in Oncology [facility] Lab 1)            Ref Range 7d ago    BCR ABL1 / ABL1 IS % 69.154 (H)      P210 BCR-ABL 1 (Order [id number])      P210 BCR-ABL 1  Status: Finalresult Visible to patient:  Not Released Nextappt: 04/22/2014 at 10:15 AM in Oncology [facility] Lab 1)         7d ago    P210 BCR ABL1 Detected      P190 BCR-ABL 1 (Order [id number])      P190 BCR-ABL 1  Status: Finalresult Visible to patient:  Not Released Nextappt: 04/22/2014 at 10:15 AM in Oncology [facility] Lab 1)         7d ago    P190 BCR ABL1 Not Detected         Diagnosis 03/20/2014 Bone Marrow, Aspirate,Biopsy, and Clot, L iliac - HYPERCELLULAR BONE MARROW WITH CHRONIC MYELOGENOUS LEUKEMIA. - SEE COMMENT. PERIPHERAL BLOOD: - CHRONIC MYELOGENOUS LEUKEMIA - NORMOCYTIC-NORMOCHROMIC ANEMIA - THROMBOCYTOSIS. Diagnosis Note FISH for t(9;22) is positive. The morphologic and molecular cytogenetic features are consistent with chronic myelogenous leukemia.  RADIOGRAPHIC STUDIES: I have personally reviewed the radiological images as listed and agreed with the findings in the report. Dg Chest 2 View  03/05/2014   CLINICAL DATA:   Left arm numbness for 1 week.  EXAM: CHEST  2 VIEW  COMPARISON:  PA and lateral chest 10/30/2012.  FINDINGS: Heart size and mediastinal contours are within normal limits. Both lungs are clear. Visualized skeletal structures are unremarkable.  IMPRESSION: Negative exam.   Electronically Signed   By: Inge Rise M.D.   On: 03/05/2014 17:13   Dg Pelvis 1-2 Views  03/05/2014   CLINICAL DATA:  Chronic right hip pain  EXAM: PELVIS - 1-2 VIEW  COMPARISON:  None.  FINDINGS: A medullary rod is noted within the right femur. No acute fracture in the pelvis is seen. Degenerative changes of the hip joints are noted. No soft tissue changes are seen.  IMPRESSION: Postoperative change without acute abnormality.   Electronically Signed   By: Inez Catalina M.D.   On: 03/05/2014 17:14   Ct Head Wo Contrast  03/05/2014   CLINICAL DATA:  Left-sided weakness and numbness, slurred speech this morning. History of CVA.  EXAM: CT HEAD WITHOUT CONTRAST  TECHNIQUE: Contiguous axial images were obtained from the base of the skull through the vertex without intravenous contrast.  COMPARISON:  09/11/2010  FINDINGS: Atherosclerotic and physiologic intracranial calcifications. Old bilateral nasal bone fractures. Mild atrophy.  There is no evidence of acute intracranial hemorrhage, brain edema, mass lesion, acute infarction, mass effect, or midline shift. Acute infarct may be inapparent on noncontrast CT. No other intra-axial abnormalities are seen, and the ventricles and sulci are within normal limits in size and symmetry. No abnormal extra-axial fluid collections or masses are identified. No significant calvarial abnormality.  IMPRESSION: 1. Negative for bleed or other acute intracranial process.   Electronically Signed   By: Arne Cleveland M.D.   On: 03/05/2014 16:49   Ct Abdomen Pelvis W Contrast  03/18/2014   CLINICAL DATA:  Bilateral groin pain, elevated white blood cell count  EXAM: CT ABDOMEN AND PELVIS WITH CONTRAST   TECHNIQUE: Multidetector CT  imaging of the abdomen and pelvis was performed using the standard protocol following bolus administration of intravenous contrast.  CONTRAST:  164m OMNIPAQUE IOHEXOL 300 MG/ML  SOLN  COMPARISON:  None.  FINDINGS: The lung bases are free of acute infiltrate or sizable effusion.  The liver, gallbladder, spleen, adrenal glands and pancreas are all normal in their CT appearance with the exception of calcifications in the pancreatic head. The kidneys are well visualized bilaterally and reveal no renal calculi or obstructive changes.  The appendix is well visualized and within normal limits. The bladder is well distended. The prostate is unremarkable. Bony structures demonstrate postoperative change in the proximal right femur.  IMPRESSION: No acute abnormality in the abdomen and pelvis.   Electronically Signed   By: MInez CatalinaM.D.   On: 03/18/2014 20:22      ASSESSMENT & PLAN:  68year old African-American male, with past medical history of hypertension, hypercholesterolemia, history of stroke twice, right hip surgery, was found to have significant elevated WBC and thrombocytosis.   1. Chronic myelogenous leukemia (CML), chronic phase -Processes peripheral blood and bone marrow showed chromosome 9 and 22 translocation, which is consistent with CML. His bone marrow did not reveal any blasts, he is in chronic phase. -I discussed his diagnosis and the disease overall illnesses with patient and his cousin. This is very treatable disease with passing kinase inhibitor, however it may transformed to acute leukemia in the future. -I recommend him at 400 mg once daily, start as well as he receives the medication. I'll send the prescription to our outpatient pharmacy, the insurance approval and co-pay assistance may take some time. Potential side effects of imatinib, which includes but not limited to fluid retention, nausea vomiting, diarrhea, myalgia, fatigue, cytopenia, CHF, etc. with  expensive patient. He agrees to proceed. -I will monitor his complete blood counts with differential every 2-4 weeks until he achieves complete hematological response, then follow-up his BCR ABL PCR to monitor his molecular response to imatinib.  2. Hypertension, hypercholesterolemia, history of stroke -He will continue his current medication and follow up with his primary care physician.  Follow-up: Return to clinic in 3 weeks with repeat lab CBC and differential.  All questions were answered. The patient knows to call the clinic with any problems, questions or concerns. I spent 30 minutes counseling the patient face to face. The total time spent in the appointment was 40 minutes and more than 50% was on counseling.     FTruitt Merle MD 03/27/2014 10:11 PM

## 2014-03-28 ENCOUNTER — Telehealth: Payer: Self-pay | Admitting: *Deleted

## 2014-03-28 LAB — TISSUE HYBRIDIZATION (BONE MARROW)-NCBH

## 2014-03-28 LAB — CHROMOSOME ANALYSIS, BONE MARROW

## 2014-03-28 NOTE — Telephone Encounter (Signed)
[place] faxed Prior authorization request for Gleevec 400 mg.  Request to [employer] for review.

## 2014-03-29 ENCOUNTER — Encounter: Payer: Self-pay | Admitting: Hematology

## 2014-03-29 NOTE — Progress Notes (Signed)
Called [facility] [phone] and spoke to Janett Billow and his Dixie has been approved from 03/29/14 to 03/29/15. His PA number is [id number].  His Part D ID # is [id number].  [place] and let them know they could fill it.

## 2014-04-03 ENCOUNTER — Encounter (HOSPITAL_COMMUNITY): Payer: Self-pay

## 2014-04-08 ENCOUNTER — Ambulatory Visit: Payer: Medicare Other

## 2014-04-08 ENCOUNTER — Inpatient Hospital Stay: Payer: Medicare Other | Admitting: Hematology

## 2014-04-10 ENCOUNTER — Telehealth: Payer: Self-pay | Admitting: *Deleted

## 2014-04-10 NOTE — Telephone Encounter (Signed)
Called pt to see if he has started his gleevec & he has.  Script picked up on 03/29/14.  He states he is doing well with it except a little nauseated.  He mentions feeling some numbness in his arm like he had when he had a stroke.  Instructed to go to ED & also call his PCP.  He states that he called his PCP & scheduled an appt but will go to ED for eval.

## 2014-04-19 DIAGNOSIS — I429 Cardiomyopathy, unspecified: Secondary | ICD-10-CM | POA: Diagnosis not present

## 2014-04-19 DIAGNOSIS — I509 Heart failure, unspecified: Secondary | ICD-10-CM | POA: Diagnosis not present

## 2014-04-19 DIAGNOSIS — I2510 Atherosclerotic heart disease of native coronary artery without angina pectoris: Secondary | ICD-10-CM | POA: Diagnosis not present

## 2014-04-19 DIAGNOSIS — I10 Essential (primary) hypertension: Secondary | ICD-10-CM | POA: Diagnosis not present

## 2014-04-19 DIAGNOSIS — E785 Hyperlipidemia, unspecified: Secondary | ICD-10-CM | POA: Diagnosis not present

## 2014-04-20 DIAGNOSIS — I2510 Atherosclerotic heart disease of native coronary artery without angina pectoris: Secondary | ICD-10-CM | POA: Diagnosis not present

## 2014-04-22 ENCOUNTER — Telehealth: Payer: Self-pay | Admitting: Hematology

## 2014-04-22 ENCOUNTER — Other Ambulatory Visit (HOSPITAL_BASED_OUTPATIENT_CLINIC_OR_DEPARTMENT_OTHER): Payer: Medicare Other

## 2014-04-22 ENCOUNTER — Ambulatory Visit (HOSPITAL_BASED_OUTPATIENT_CLINIC_OR_DEPARTMENT_OTHER): Payer: Medicare Other | Admitting: Hematology

## 2014-04-22 ENCOUNTER — Encounter: Payer: Self-pay | Admitting: Hematology

## 2014-04-22 VITALS — BP 146/96 | HR 71 | Temp 98.3°F | Resp 18 | Ht 68.0 in | Wt 193.2 lb

## 2014-04-22 DIAGNOSIS — C9210 Chronic myeloid leukemia, BCR/ABL-positive, not having achieved remission: Secondary | ICD-10-CM | POA: Diagnosis not present

## 2014-04-22 LAB — COMPREHENSIVE METABOLIC PANEL (CC13)
ALK PHOS: 112 U/L (ref 40–150)
ALT: 15 U/L (ref 0–55)
AST: 14 U/L (ref 5–34)
Albumin: 3.9 g/dL (ref 3.5–5.0)
Anion Gap: 7 mEq/L (ref 3–11)
BILIRUBIN TOTAL: 0.29 mg/dL (ref 0.20–1.20)
BUN: 14.6 mg/dL (ref 7.0–26.0)
CO2: 28 mEq/L (ref 22–29)
Calcium: 9 mg/dL (ref 8.4–10.4)
Chloride: 103 mEq/L (ref 98–109)
Creatinine: 1.2 mg/dL (ref 0.7–1.3)
EGFR: 72 mL/min/{1.73_m2} — AB (ref >90)
Glucose: 161 mg/dl — ABNORMAL HIGH (ref 70–140)
Potassium: 4.5 mEq/L (ref 3.5–5.1)
Sodium: 138 mEq/L (ref 136–145)
Total Protein: 7.3 g/dL (ref 6.4–8.3)

## 2014-04-22 LAB — CBC WITH DIFFERENTIAL/PLATELET
BASO%: 0.2 % (ref 0.0–2.0)
Basophils Absolute: 0 10*3/uL (ref 0.0–0.1)
EOS%: 2.6 % (ref 0.0–7.0)
Eosinophils Absolute: 0.1 10*3/uL (ref 0.0–0.5)
HCT: 32.2 % — ABNORMAL LOW (ref 38.4–49.9)
HEMOGLOBIN: 10.3 g/dL — AB (ref 13.0–17.1)
LYMPH%: 22.1 % (ref 14.0–49.0)
MCH: 30.7 pg (ref 27.2–33.4)
MCHC: 32 g/dL (ref 32.0–36.0)
MCV: 96.1 fL (ref 79.3–98.0)
MONO#: 0.5 10*3/uL (ref 0.1–0.9)
MONO%: 11.6 % (ref 0.0–14.0)
NEUT%: 63.5 % (ref 39.0–75.0)
NEUTROS ABS: 3 10*3/uL (ref 1.5–6.5)
Platelets: 575 10*3/uL — ABNORMAL HIGH (ref 140–400)
RBC: 3.35 10*6/uL — AB (ref 4.20–5.82)
RDW: 16.2 % — ABNORMAL HIGH (ref 11.0–14.6)
WBC: 4.7 10*3/uL (ref 4.0–10.3)
lymph#: 1 10*3/uL (ref 0.9–3.3)

## 2014-04-22 LAB — LACTATE DEHYDROGENASE (CC13): LDH: 192 U/L (ref 125–245)

## 2014-04-22 NOTE — Telephone Encounter (Signed)
Called pt and confirm appt. Wanted to mailed cal & avs.

## 2014-04-22 NOTE — Progress Notes (Signed)
[place]  Telephone:(336) [phone] Fax:(336) Paul Note   Patient Care Team: Elizabeth Palau, MD as PCP - General (General Surgery) 04/22/2014  CHIEF COMPLAINTS Follow-up CML, chronic phase     CML (chronic myelocytic leukemia)   03/06/2014 Initial Diagnosis CML (chronic myelocytic leukemia), WBC 57K with ANC 46.7K, plt 824K.    03/19/2014 Miscellaneous Peripheral blood PCR/ABL P210 (+), P190(-), PCR IS 69%.    03/20/2014 Bone Marrow Biopsy hypercellular marrow, c/w CML, no blasts, FISH(+) t(9,22)     CURRENT THERAPY: Gleevec 439m daily started on 03/28/14     HISTORY OF INITIAL PRESENTING ILLNESS:  HAYINDE SWIM69y.o. male with newly diagnosed CML, chronic phase. I initially saw him in the hospital, he is here for follow-up.   [facility], [facility], and was found to have leukocytosis with WBC 40 3.4K, ANC 30 4.3K, hemoglobin 11.7, and thrombocytosis with platelet count 775K. His cardiac workup was negative. Hematology was consulted,  But pt left AMA same day prior to further workup could be performed, including a bone marrow biopsy.  He again presented to the ED on 12/7 with bilateral hip and acute on chronic back pain. At the ED his WBC was 57,000 (ANC 46.7 abs, 5.8 lymphs and 2.9 monos), along with marked thrombocytosis at 824,000. I saw him in the hospital and recommended a bone marrow biopsy, which was done on 03/20/2014. I reviewed his peripheral smear with the pathologist Dr. SGari Crown and both of [place] he is on peripheral smear was most likely consistent with myeloproliferative neoplasm, likely CML.   He has chronic right hip pain and low back pain, he had right hip surgery due to his injury from his work in 1996. He otherwise feels well, denies other pain, fever, chills, night sweats or other symptoms.  He does carry a history of strokes twice, in the setting of  hypercholesterolemia and hypertension. He denies any slurred speech or gait instability. No vision changes. He is on daily ASA. No NSAIDs. He also consumes significant amount of alcohol.   INTERIM HISTORY: HMerrillreturns for follow up. He feels better overall. He complains numbness at left arm and chest, and intermittent chest palpitation, no chest pain or dypnea. No fever or chill, or other complains. His hip pain is stable. He tolerates gleevec well, mild nausea, no other complains. He states he takes it every day, and will refill in a few days.   MEDICAL HISTORY:  Past Medical History  Diagnosis Date  . Coronary artery disease   . Hypertension   . Hypercholesterolemia   . Stroke     SURGICAL HISTORY: Past Surgical History  Procedure Laterality Date  . Back surgery    . Hip arthroplasty      SOCIAL HISTORY: History   Social History  . Marital Status: Widowed    Spouse Name: N/A    Number of Children: No   . Years of Education: N/A   Occupational History  . Retired, used to work as a sWellsite geologist   Social History Main Topics  . Smoking status: Current Every Day Smoker    Types: Cigarettes  . Smokeless tobacco: Not on file  . Alcohol Use: Yes  . Drug Use: No  . Sexual Activity: Not on file   Other Topics Concern  . Not on file   Social History Narrative    FAMILY HISTORY: Family History  Problem Relation Age of Onset  . Diabetes Mother   . Diabetes Father     ALLERGIES:  has No Known Allergies.  MEDICATIONS:  Current Outpatient Prescriptions  Medication Sig Dispense Refill  . aspirin 325 MG tablet Take 325 mg by mouth every 6 (six) hours as needed for moderate pain.    Marland Kitchen atorvastatin (LIPITOR) 40 MG tablet Take 40 mg by mouth every morning.   0  . carvedilol (COREG) 3.125 MG tablet Take 3.125 mg by mouth 2 (two) times daily.  0  . clopidogrel (PLAVIX) 75 MG tablet Take 1 tablet (75 mg total) by mouth daily. 30 tablet 3  . imatinib (GLEEVEC) 400 MG  tablet Take 1 tablet (400 mg total) by mouth daily. Take with meals and large glass of water.Caution:Chemotherapy. 30 tablet 5  . lisinopril (PRINIVIL,ZESTRIL) 20 MG tablet Take 20 mg by mouth daily.  0  . oxyCODONE-acetaminophen (PERCOCET) 10-325 MG per tablet Take 1 tablet by mouth 2 (two) times daily.  0   No current facility-administered medications for this visit.    REVIEW OF SYSTEMS:   Constitutional: Denies fevers, chills or abnormal night sweats Eyes: Denies blurriness of vision, double vision or watery eyes Ears, nose, mouth, throat, and face: Denies mucositis or sore throat Respiratory: Denies cough, dyspnea or wheezes Cardiovascular: Denies palpitation, chest discomfort or lower extremity swelling Gastrointestinal:  Denies nausea, heartburn or change in bowel habits Skin: Denies abnormal skin rashes Lymphatics: Denies new lymphadenopathy or easy bruising Neurological:Denies numbness, tingling or new weaknesses Behavioral/Psych: Mood is stable, no new changes  All other systems were reviewed with the patient and are negative.  PHYSICAL EXAMINATION: ECOG PERFORMANCE STATUS: 1 - Symptomatic but completely ambulatory  Filed Vitals:   04/22/14 1111  BP: 146/96  Pulse: 71  Temp: 98.3 F (36.8 C)  Resp: 18   Filed Weights   04/22/14 1111  Weight: 193 lb 3.2 oz (87.635 kg)    GENERAL:alert, no distress and comfortable SKIN: skin color, texture, turgor are normal, no rashes or significant lesions EYES: normal, conjunctiva are pink and non-injected, sclera clear OROPHARYNX:no exudate, no erythema and lips, buccal mucosa, and tongue normal  NECK: supple, thyroid normal size, non-tender, without nodularity LYMPH:  no palpable lymphadenopathy in the cervical, axillary or inguinal LUNGS: clear to auscultation and percussion with normal breathing effort HEART: regular rate & rhythm and no murmurs and no lower extremity edema ABDOMEN:abdomen soft, non-tender and normal bowel  sounds Musculoskeletal:no cyanosis of digits and no clubbing  PSYCH: alert & oriented x 3 with fluent speech NEURO: no focal motor/sensory deficits  LABORATORY DATA:  I have reviewed the data as listed Lab Results  Component Value Date   WBC 4.7 04/22/2014   HGB 10.3* 04/22/2014   HCT 32.2* 04/22/2014   MCV 96.1 04/22/2014   PLT 575* 04/22/2014    Recent Labs  03/05/14 1515  03/18/14 1458 03/19/14 0635 03/21/14 0351 04/22/14 1048  NA 141  < > 137 141 139 138  K 4.0  < > 4.2 4.0 4.0 4.5  CL 102  < > 99 102 102  --   CO2 26  < > _0 GLUCOSE 97  < > 205* 105* 104* 161*  BUN 15  < > _1 14.6  CREATININE 1.11  < > 1.04 1.07 1.11 1.2  CALCIUM 9.7  < > 9.5 8.7 8.9 9.0  GFRNONAA 66*  < > 72* 69* 66*  --   GFRAA 77*  < >  83* 80* 77*  --   PROT 8.1  --  8.2  --   --  7.3  ALBUMIN 4.4  --  4.2  --   --  3.9  AST 16  --  25  --   --  14  ALT 15  --  21  --   --  15  ALKPHOS 89  --  91  --   --  112  BILITOT 0.3  --  0.2*  --   --  0.29  < > = values in this interval not displayed.  Bcr/abl gene rearrangement qnt, PCR (Order [id number])      Bcr/abl gene rearrangement qnt, PCR  Status: Finalresult Visible to patient:  Not Released Nextappt: 04/22/2014 at 10:15 AM in Oncology [facility] Lab 1)            Ref Range 7d ago    BCR ABL1 / ABL1 IS % 69.154 (H)      P210 BCR-ABL 1 (Order [id number])      P210 BCR-ABL 1  Status: Finalresult Visible to patient:  Not Released Nextappt: 04/22/2014 at 10:15 AM in Oncology [facility] Lab 1)         7d ago    P210 BCR ABL1 Detected      P190 BCR-ABL 1 (Order [id number])      P190 BCR-ABL 1  Status: Finalresult Visible to patient:  Not Released Nextappt: 04/22/2014 at 10:15 AM in Oncology [facility] Lab 1)         7d ago    P190 BCR ABL1 Not Detected         Diagnosis 03/20/2014 Bone Marrow, Aspirate,Biopsy, and Clot, L iliac - HYPERCELLULAR BONE  MARROW WITH CHRONIC MYELOGENOUS LEUKEMIA. - SEE COMMENT. PERIPHERAL BLOOD: - CHRONIC MYELOGENOUS LEUKEMIA - NORMOCYTIC-NORMOCHROMIC ANEMIA - THROMBOCYTOSIS. Diagnosis Note FISH for t(9;22) is positive. The morphologic and molecular cytogenetic features are consistent with chronic myelogenous leukemia.  RADIOGRAPHIC STUDIES: No new scans     ASSESSMENT & PLAN:  69 year old African-American male, with past medical history of hypertension, hypercholesterolemia, history of stroke twice, right hip surgery, was found to have significant elevated WBC and thrombocytosis.   1. Chronic myelogenous leukemia (CML), chronic phase -He has significant elevated WBC, His peripheral blood and bone marrow showed chromosome 9 and 22 translocation, which is consistent with CML. His bone marrow did not reveal any blasts, he is in chronic phase. -I discussed his diagnosis and the disease overall illnesses with patient and his cousin. This is very treatable disease with passing kinase inhibitor, however it may transformed to acute leukemia in the future. -He is tolerating Gleevec well, his Neoma L[facility] has down to normal today after 3-4 weeks of treatment. He has had excellent hematologic response. We'll check his BCR/ABL PCR next time to monitor his molecular response to imatinib.  2. Hypertension, hypercholesterolemia, history of stroke -He will continue his current medication and follow up with his primary care physician.   3. Left arm numbness -His neurologic exam was unremarkable today. This has been going on for a few weeks. -Giving his previous history of multiple stroke, I'll refer him to the neurologist for further evaluation.  Follow-up: Return to clinic in 4 weeks with repeat lab CBC and differential, and BCR/ABL.  All questions were answered. The patient knows to call the clinic with any problems, questions or concerns. I spent 15 minutes counseling the patient face to face. The total time spent  in the appointment was 20 minutes and  more than 50% was on counseling.     Truitt Merle, MD 04/22/2014 11:30 AM

## 2014-04-24 NOTE — Discharge Summary (Signed)
Bruce Bruce [name], Bruce [name]             ACCOUNT NO.:  [id number]  MEDICAL RECORD NO.:  [id number]  LOCATION:  [id number]                        FACILITY:  [facility]  PHYSICIAN:  Alexus Galka N. Terrence Dupont, M.D. DATE OF BIRTH:  07-13-1945  DATE OF ADMISSION:  03/05/2014 DATE OF DISCHARGE:  03/06/2014                              DISCHARGE SUMMARY   ADMITTING DIAGNOSES: 1. Atypical chest pain, left arm numbness, rule out myocardial     infarction, doubt transient ischemic attack. 2. Hypertension. 3. Tobacco abuse. 4. Alcohol abuse. 5. Marked leukocytosis. 6. Thrombocytosis questionable etiology, rule out leukemia. 7. Acute bronchitis.  DISCHARGE DIAGNOSES: 1. Atypical chest pain, left arm numbness, rule out myocardial     infarction, doubt transient ischemic attack. 2. Hypertension. 3. Tobacco abuse. 4. Alcohol abuse. 5. Marked leukocytosis. 6. Thrombocytosis questionable etiology, rule out leukemia. 7. Acute bronchitis.  The patient signed out against medical advice.  BRIEF HISTORY AND HOSPITAL COURSE:  Bruce Bruce [name] is a 69 year old male with past medical history significant for mild coronary artery disease, nonischemic cardiomyopathy.  EF approximately 40%, hypertension, alcohol abuse, tobacco abuse, degenerative joint disease, history of CVA x2.  He came to the ER complaining of vague left-sided chest pain associated with left arm numbness off and on lasting 15-20 minutes.  The patient is very vague in his history.  Denies any anginal chest pain.  Denies nausea, vomiting, diaphoresis.  Denies any weakness in the arms or legs. Denies slurred speech.  The patient also complains of cough with yellowish phlegm for last few weeks, took over-the-counter medication without much improvement.  Denies any fever or chills.  In the ED, the patient was noted to have marked leukocytosis of 40, white count of 40,000.  The patient denies any abdominal pain.  Denies diarrhea and denies any recent antibiotic use.   Denies any recent surgeries or abscesses.  The patient had normal white count, few months ago.  The patient also complains of chronic back pain and hip pain, states continued to smoke, but cut down to half pack daily.  PAST MEDICAL HISTORY:  As above.  PHYSICAL EXAMINATION:  GENERAL:  He was alert, awake, and oriented x3. VITAL SIGNS:  Blood pressure 154/67, pulse 72, he was afebrile. EYES:  Conjunctivae was pink. NECK:  Supple.  No JVD.  No bruit. LUNGS:  Clear to auscultation without rhonchi or rales. CARDIOVASCULAR:  S1, S2 was normal.  There was soft systolic murmur. ABDOMEN:  Soft.  Bowel sounds were present.  Nontender. EXTREMITIES:  There was no clubbing, cyanosis, or edema. NEUROLOGIC:  Grossly intact.  LABORATORY DATA:  His sodium was 141, potassium 4.0, BUN 10, creatinine 1.07.  His LDH was elevated at 526. Hemoglobin was 10.6, hematocrit 33.2, white count of 43.7000, platelet count was 750,000.  BRIEF HOSPITAL COURSE:  The patient was admitted to telemetry unit.  MI was ruled out by serial enzymes and EKG.  Oncologic consultation was obtained with Dr. Lona Kettle Aasim.  The patient was scheduled for bone marrow aspiration biopsy, but the patient stated he is eager to go home and wanted to get that done as outpatient, reviewed his films from prior angiogram which was done approximately 3 years ago, which showed mild  coronary artery disease, with mildly depressed LV systolic function. The patient signed out against medical advice on March 06, 2014, and will be followed up by [facility]cology as outpatient.     Allegra Lai. Terrence Dupont, M.D.     MNH/MEDQ  D:  04/24/2014  T:  04/24/2014  Job:  [id number]

## 2014-04-25 ENCOUNTER — Telehealth: Payer: Self-pay | Admitting: Hematology

## 2014-04-25 NOTE — Telephone Encounter (Signed)
Faxed pt medical records to  [facility]

## 2014-05-03 ENCOUNTER — Ambulatory Visit: Payer: Medicare Other | Admitting: Neurology

## 2014-05-10 ENCOUNTER — Encounter: Payer: Self-pay | Admitting: Neurology

## 2014-05-10 ENCOUNTER — Ambulatory Visit (INDEPENDENT_AMBULATORY_CARE_PROVIDER_SITE_OTHER): Payer: Medicare Other | Admitting: Neurology

## 2014-05-10 VITALS — BP 143/75 | HR 90 | Ht 68.0 in | Wt 191.8 lb

## 2014-05-10 DIAGNOSIS — G451 Carotid artery syndrome (hemispheric): Secondary | ICD-10-CM | POA: Diagnosis not present

## 2014-05-10 DIAGNOSIS — G459 Transient cerebral ischemic attack, unspecified: Secondary | ICD-10-CM

## 2014-05-10 HISTORY — DX: Transient cerebral ischemic attack, unspecified: G45.9

## 2014-05-10 NOTE — Progress Notes (Signed)
Reason for visit: Left-sided weakness  Bruce [name] is a 69 y.o. male  History of present illness:  Mr. Kueker is a 69 year old right-handed black male with a history of cerebrovascular disease. He more recently has been followed for CML associated with a very high white blood count and thrombocytosis. He is on low-dose aspirin taking 81 mg daily. Indicates that 2 years ago he was seen at [facility] with a transient episode of weakness that affected the left side lasting about 30 seconds. The patient indicates that he underwent a workup for the stroke, he does not recall what the etiology of the stroke was. Beginning in November 2015, the patient has had frequent episodes of transient left-sided weakness affecting the arm and leg. The patient is a very poor historian, and getting an accurate history is very difficult. The patient indicates that he does not have headaches, visual changes, he may have some slurred speech at times. He has a chronic mild gait disorder associated with a prior right hip surgery for fracture. The patient has some numbness in both hands, not in the feet. He denies any difficulty controlling the bowels or the bladder, but he indicates that he does have some blood in the stool and burning when he passes stool. The patient denies any memory problems, or difficulty with concentration. He denies any double vision or loss of vision. He is sent to this office for further evaluation.  Past Medical History  Diagnosis Date  . Coronary artery disease   . Hypertension   . Hypercholesterolemia   . Stroke   . CML (chronic myelocytic leukemia)   . TIA (transient ischemic attack) 05/10/2014    Past Surgical History  Procedure Laterality Date  . Back surgery    . Hip arthroplasty Right     orif  . Orif forearm fracture Right     Family History  Problem Relation Age of Onset  . Diabetes Mother   . Hypertension Mother   . Diabetes Father   . Hypertension Father   .  Diabetes Brother   . Hypertension Brother   . Diabetes Sister   . Hypertension Sister   . Diabetes Brother   . Hypertension Brother   . Diabetes Sister   . Hypertension Sister     Social history:  reports that he has quit smoking. His smoking use included Cigarettes. He has a 25 pack-year smoking history. He has never used smokeless tobacco. He reports that he drinks about 0.6 oz of alcohol per week. He reports that he does not use illicit drugs.  Medications:  Current Outpatient Prescriptions on File Prior to Visit  Medication Sig Dispense Refill  . atorvastatin (LIPITOR) 40 MG tablet Take 40 mg by mouth every morning.   0  . carvedilol (COREG) 3.125 MG tablet Take 3.125 mg by mouth 2 (two) times daily.  0  . clopidogrel (PLAVIX) 75 MG tablet Take 1 tablet (75 mg total) by mouth daily. 30 tablet 3  . imatinib (GLEEVEC) 400 MG tablet Take 1 tablet (400 mg total) by mouth daily. Take with meals and large glass of water.Caution:Chemotherapy. 30 tablet 5  . lisinopril (PRINIVIL,ZESTRIL) 20 MG tablet Take 20 mg by mouth daily.  0  . oxyCODONE-acetaminophen (PERCOCET) 10-325 MG per tablet Take 1 tablet by mouth 2 (two) times daily.  0   No current facility-administered medications on file prior to visit.     No Known Allergies  ROS:  Out of a complete 14 system  review of symptoms, the patient complains only of the following symptoms, and all other reviewed systems are negative.  Cough Blood in the stool Feeling cold Joint pain  Blood pressure 143/75, pulse 90, height 5\' 8"  (1.727 m), weight 191 lb 12.8 oz (87 kg).  Physical Exam  General: The patient is alert and cooperative at the time of the examination.  Eyes: Pupils are equal, round, and reactive to light. Discs are flat bilaterally.  Neck: The neck is supple, no carotid bruits are noted.  Respiratory: The respiratory examination is clear.  Cardiovascular: The cardiovascular examination reveals a regular rate and  rhythm, no obvious murmurs or rubs are noted.  Skin: Extremities are without significant edema.  Neurologic Exam  Mental status: The patient is alert and oriented x 3 at the time of the examination. The patient has apparent normal recent and remote memory, with an apparently normal attention span and concentration ability.  Cranial nerves: Facial symmetry is not present. There is a slight depression the left nasolabial fold, and increased intrapalpebral fissure on the left eye. There is good sensation of the face to pinprick and soft touch bilaterally. The strength of the facial muscles and the muscles to head turning and shoulder shrug are normal bilaterally. Speech is well enunciated, no aphasia or dysarthria is noted. Extraocular movements are full. Visual fields are full. The tongue is midline, and the patient has symmetric elevation of the soft palate. No obvious hearing deficits are noted.  Motor: The motor testing reveals 5 over 5 strength of all 4 extremities. Good symmetric motor tone is noted throughout.  Sensory: Sensory testing is intact to pinprick, soft touch, vibration sensation, and position sense on all 4 extremities. No evidence of extinction is noted.  Coordination: Cerebellar testing reveals good finger-nose-finger and heel-to-shin bilaterally.  Gait and station: Gait is associated with a limping quality on the right leg. Tandem gait is slightly unsteady. The patient on the walks with a cane. Romberg is negative. No drift is seen.  Reflexes: Deep tendon reflexes are symmetric, but are depressed bilaterally. Toes are downgoing bilaterally.   CT head 03/05/14:  IMPRESSION: 1. Negative for bleed or other acute intracranial process.     Assessment/Plan:  1. CML  2. Cerebrovascular disease, transient episodes of left-sided weakness  The patient is a very poor historian, getting accurate history is very difficult. It appears that the patient has had some episodes of  left-sided weakness. Indicates that the episodes are occurring frequently. The patient will be set up for MRI evaluation of the brain, MRA of the head, and a carotid Doppler study. He will continue the aspirin therapy. Depending upon the results of the above, we may consider an EEG study in the future.  Jill Alexanders MD 05/12/2014 12:00 PM  [facility] [address] [place],  [address]  Phone [phone] Fax [phone]

## 2014-05-10 NOTE — Patient Instructions (Signed)
Transient Ischemic Attack  A transient ischemic attack (TIA) is a "warning stroke" that causes stroke-like symptoms. Unlike a stroke, a TIA does not cause permanent damage to the brain. The symptoms of a TIA can happen very fast and do not last long. It is important to know the symptoms of a TIA and what to do. This can help prevent a major stroke or death.  CAUSES   · A TIA is caused by a temporary blockage in an artery in the brain or neck (carotid artery). The blockage does not allow the brain to get the blood supply it needs and can cause different symptoms. The blockage can be caused by either:  ¨ A blood clot.  ¨ Fatty buildup (plaque) in a neck or brain artery.  RISK FACTORS  · High blood pressure (hypertension).  · High cholesterol.  · Diabetes mellitus.  · Heart disease.  · The build up of plaque in the blood vessels (peripheral artery disease or atherosclerosis).  · The build up of plaque in the blood vessels providing blood and oxygen to the brain (carotid artery stenosis).  · An abnormal heart rhythm (atrial fibrillation).  · Obesity.  · Smoking.  · Taking oral contraceptives (especially in combination with smoking).  · Physical inactivity.  · A diet high in fats, salt (sodium), and calories.  · Alcohol use.  · Use of illegal drugs (especially cocaine and methamphetamine).  · Being male.  · Being African American.  · Being over the age of 55.  · Family history of stroke.  · Previous history of blood clots, stroke, TIA, or heart attack.  · Sickle cell disease.  SYMPTOMS   TIA symptoms are the same as a stroke but are temporary. These symptoms usually develop suddenly, or may be newly present upon awakening from sleep:  · Sudden weakness or numbness of the face, arm, or leg, especially on one side of the body.  · Sudden trouble walking or difficulty moving arms or legs.  · Sudden confusion.  · Sudden personality changes.  · Trouble speaking (aphasia) or understanding.  · Difficulty swallowing.  · Sudden  trouble seeing in one or both eyes.  · Double vision.  · Dizziness.  · Loss of balance or coordination.  · Sudden severe headache with no known cause.  · Trouble reading or writing.  · Loss of bowel or bladder control.  · Loss of consciousness.  DIAGNOSIS   Your caregiver may be able to determine the presence or absence of a TIA based on your symptoms, history, and physical exam. Computed tomography (CT scan) of the brain is usually performed to help identify a TIA. Other tests may be done to diagnose a TIA. These tests may include:  · Electrocardiography.  · Continuous heart monitoring.  · Echocardiography.  · Carotid ultrasonography.  · Magnetic resonance imaging (MRI).  · A scan of the brain circulation.  · Blood tests.  PREVENTION   The risk of a TIA can be decreased by appropriately treating high blood pressure, high cholesterol, diabetes, heart disease, and obesity and by quitting smoking, limiting alcohol, and staying physically active.  TREATMENT   Time is of the essence. Since the symptoms of TIA are the same as a stroke, it is important to seek treatment as soon as possible because you may need a medicine to dissolve the clot (thrombolytic) that cannot be given if too much time has passed. Treatment options vary. Treatment options may include rest, oxygen, intravenous (IV) fluids,   and medicines to thin the blood (anticoagulants). Medicines and diet may be used to address diabetes, high blood pressure, and other risk factors. Measures will be taken to prevent short-term and long-term complications, including infection from breathing foreign material into the lungs (aspiration pneumonia), blood clots in the legs, and falls. Treatment options include procedures to either remove plaque in the carotid arteries or dilate carotid arteries that have narrowed due to plaque. Those procedures are:  · Carotid endarterectomy.  · Carotid angioplasty and stenting.  HOME CARE INSTRUCTIONS   · Take all medicines prescribed  by your caregiver. Follow the directions carefully. Medicines may be used to control risk factors for a stroke. Be sure you understand all your medicine instructions.  · You may be told to take aspirin or the anticoagulant warfarin. Warfarin needs to be taken exactly as instructed.  ¨ Taking too much or too little warfarin is dangerous. Too much warfarin increases the risk of bleeding. Too little warfarin continues to allow the risk for blood clots. While taking warfarin, you will need to have regular blood tests to measure your blood clotting time. A PT blood test measures how long it takes for blood to clot. Your PT is used to calculate another value called an INR. Your PT and INR help your caregiver to adjust your dose of warfarin. The dose can change for many reasons. It is critically important that you take warfarin exactly as prescribed.  ¨ Many foods, especially foods high in vitamin K can interfere with warfarin and affect the PT and INR. Foods high in vitamin K include spinach, kale, broccoli, cabbage, collard and turnip greens, brussels sprouts, peas, cauliflower, seaweed, and parsley as well as beef and pork liver, green tea, and soybean oil. You should eat a consistent amount of foods high in vitamin K. Avoid major changes in your diet, or notify your caregiver before changing your diet. Arrange a visit with a dietitian to answer your questions.  ¨ Many medicines can interfere with warfarin and affect the PT and INR. You must tell your caregiver about any and all medicines you take, this includes all vitamins and supplements. Be especially cautious with aspirin and anti-inflammatory medicines. Do not take or discontinue any prescribed or over-the-counter medicine except on the advice of your caregiver or pharmacist.  ¨ Warfarin can have side effects, such as excessive bruising or bleeding. You will need to hold pressure over cuts for longer than usual. Your caregiver or pharmacist will discuss other  potential side effects.  ¨ Avoid sports or activities that may cause injury or bleeding.  ¨ Be mindful when shaving, flossing your teeth, or handling sharp objects.  ¨ Alcohol can change the body's ability to handle warfarin. It is best to avoid alcoholic drinks or consume only very small amounts while taking warfarin. Notify your caregiver if you change your alcohol intake.  ¨ Notify your dentist or other caregivers before procedures.  · Eat a diet that includes 5 or more servings of fruits and vegetables each day. This may reduce the risk of stroke. Certain diets may be prescribed to address high blood pressure, high cholesterol, diabetes, or obesity.  ¨ A low-sodium, low-saturated fat, low-trans fat, low-cholesterol diet is recommended to manage high blood pressure.  ¨ A low-saturated fat, low-trans fat, low-cholesterol, and high-fiber diet may control cholesterol levels.  ¨ A controlled-carbohydrate, controlled-sugar diet is recommended to manage diabetes.  ¨ A reduced-calorie, low-sodium, low-saturated fat, low-trans fat, low-cholesterol diet is recommended to manage obesity.  ·   Maintain a healthy weight.  · Stay physically active. It is recommended that you get at least 30 minutes of activity on most or all days.  · Do not smoke.  · Limit alcohol use even if you are not taking warfarin. Moderate alcohol use is considered to be:  ¨ No more than 2 drinks each day for men.  ¨ No more than 1 drink each day for nonpregnant women.  · Stop drug abuse.  · Home safety. A safe home environment is important to reduce the risk of falls. Your caregiver may arrange for specialists to evaluate your home. Having grab bars in the bedroom and bathroom is often important. Your caregiver may arrange for equipment to be used at home, such as raised toilets and a seat for the shower.  · Follow all instructions for follow-up with your caregiver. This is very important. This includes any referrals and lab tests. Proper follow up can  prevent a stroke or another TIA from occurring.  SEEK MEDICAL CARE IF:  · You have personality changes.  · You have difficulty swallowing.  · You are seeing double.  · You have dizziness.  · You have a fever.  · You have skin breakdown.  SEEK IMMEDIATE MEDICAL CARE IF:   Any of these symptoms may represent a serious problem that is an emergency. Do not wait to see if the symptoms will go away. Get medical help right away. Call your local emergency services (911 in [place] Do not drive yourself to the hospital.  · You have sudden weakness or numbness of the face, arm, or leg, especially on one side of the body.  · You have sudden trouble walking or difficulty moving arms or legs.  · You have sudden confusion.  · You have trouble speaking (aphasia) or understanding.  · You have sudden trouble seeing in one or both eyes.  · You have a loss of balance or coordination.  · You have a sudden, severe headache with no known cause.  · You have new chest pain or an irregular heartbeat.  · You have a partial or total loss of consciousness.  MAKE SURE YOU:   · Understand these instructions.  · Will watch your condition.  · Will get help right away if you are not doing well or get worse.  Document Released: 01/06/2005 Document Revised: 04/03/2013 Document Reviewed: 07/04/2013  Exit[employer]® Patient Information ©2015 [employer]. This information is not intended to replace advice given to you by your health care provider. Make sure you discuss any questions you have with your health care provider.

## 2014-05-13 ENCOUNTER — Other Ambulatory Visit (HOSPITAL_BASED_OUTPATIENT_CLINIC_OR_DEPARTMENT_OTHER): Payer: Medicare Other

## 2014-05-13 DIAGNOSIS — C9210 Chronic myeloid leukemia, BCR/ABL-positive, not having achieved remission: Secondary | ICD-10-CM

## 2014-05-13 DIAGNOSIS — C9290 Myeloid leukemia, unspecified, not having achieved remission: Secondary | ICD-10-CM | POA: Diagnosis not present

## 2014-05-13 LAB — COMPREHENSIVE METABOLIC PANEL (CC13)
ALT: 32 U/L (ref 0–55)
AST: 20 U/L (ref 5–34)
Albumin: 3.6 g/dL (ref 3.5–5.0)
Alkaline Phosphatase: 175 U/L — ABNORMAL HIGH (ref 40–150)
Anion Gap: 14 mEq/L — ABNORMAL HIGH (ref 3–11)
BILIRUBIN TOTAL: 0.44 mg/dL (ref 0.20–1.20)
BUN: 9.1 mg/dL (ref 7.0–26.0)
CHLORIDE: 104 meq/L (ref 98–109)
CO2: 22 mEq/L (ref 22–29)
Calcium: 8.9 mg/dL (ref 8.4–10.4)
Creatinine: 1 mg/dL (ref 0.7–1.3)
EGFR: 85 mL/min/{1.73_m2} — AB (ref >90)
Glucose: 210 mg/dl — ABNORMAL HIGH (ref 70–140)
POTASSIUM: 3.5 meq/L (ref 3.5–5.1)
Sodium: 140 mEq/L (ref 136–145)
Total Protein: 7.7 g/dL (ref 6.4–8.3)

## 2014-05-13 LAB — CBC WITH DIFFERENTIAL/PLATELET
BASO%: 0.9 % (ref 0.0–2.0)
Basophils Absolute: 0 10*3/uL (ref 0.0–0.1)
EOS ABS: 0.1 10*3/uL (ref 0.0–0.5)
EOS%: 2.2 % (ref 0.0–7.0)
HCT: 28.3 % — ABNORMAL LOW (ref 38.4–49.9)
HGB: 8.9 g/dL — ABNORMAL LOW (ref 13.0–17.1)
LYMPH#: 1.1 10*3/uL (ref 0.9–3.3)
LYMPH%: 21.8 % (ref 14.0–49.0)
MCH: 29.8 pg (ref 27.2–33.4)
MCHC: 31.4 g/dL — AB (ref 32.0–36.0)
MCV: 95 fL (ref 79.3–98.0)
MONO#: 0.5 10*3/uL (ref 0.1–0.9)
MONO%: 9.9 % (ref 0.0–14.0)
NEUT%: 65.2 % (ref 39.0–75.0)
NEUTROS ABS: 3.4 10*3/uL (ref 1.5–6.5)
Platelets: 523 10*3/uL — ABNORMAL HIGH (ref 140–400)
RBC: 2.97 10*6/uL — AB (ref 4.20–5.82)
RDW: 16.5 % — ABNORMAL HIGH (ref 11.0–14.6)
WBC: 5.2 10*3/uL (ref 4.0–10.3)

## 2014-05-13 LAB — LACTATE DEHYDROGENASE (CC13): LDH: 200 U/L (ref 125–245)

## 2014-05-16 ENCOUNTER — Ambulatory Visit (INDEPENDENT_AMBULATORY_CARE_PROVIDER_SITE_OTHER): Payer: Medicare Other

## 2014-05-16 DIAGNOSIS — G451 Carotid artery syndrome (hemispheric): Secondary | ICD-10-CM | POA: Diagnosis not present

## 2014-05-20 ENCOUNTER — Ambulatory Visit: Payer: 59 | Admitting: Hematology

## 2014-05-21 ENCOUNTER — Telehealth: Payer: Self-pay | Admitting: Neurology

## 2014-05-21 NOTE — Telephone Encounter (Signed)
I called the patient. The carotid Doppler study is unremarkable, I will contact him when the MRI study results are available.

## 2014-05-22 ENCOUNTER — Telehealth: Payer: Self-pay | Admitting: Hematology

## 2014-05-22 ENCOUNTER — Ambulatory Visit (HOSPITAL_BASED_OUTPATIENT_CLINIC_OR_DEPARTMENT_OTHER): Payer: Medicare Other | Admitting: Hematology

## 2014-05-22 ENCOUNTER — Telehealth: Payer: Self-pay | Admitting: *Deleted

## 2014-05-22 ENCOUNTER — Encounter: Payer: Self-pay | Admitting: Hematology

## 2014-05-22 VITALS — BP 156/83 | HR 80 | Temp 98.5°F | Resp 18 | Ht 68.0 in | Wt 188.7 lb

## 2014-05-22 DIAGNOSIS — Z8673 Personal history of transient ischemic attack (TIA), and cerebral infarction without residual deficits: Secondary | ICD-10-CM | POA: Diagnosis not present

## 2014-05-22 DIAGNOSIS — I10 Essential (primary) hypertension: Secondary | ICD-10-CM

## 2014-05-22 DIAGNOSIS — C9210 Chronic myeloid leukemia, BCR/ABL-positive, not having achieved remission: Secondary | ICD-10-CM | POA: Diagnosis not present

## 2014-05-22 MED ORDER — DASATINIB 100 MG PO TABS: 100.0000 mg | ORAL_TABLET | Freq: Every day | ORAL | Status: DC

## 2014-05-22 NOTE — Telephone Encounter (Signed)
Faxed [facility] prescription to [facility].  Informed Aaron Edelman, pharmacist of same info.

## 2014-05-22 NOTE — Progress Notes (Signed)
[facility]  [phone] [phone]  Clinic New Consult Note   Patient Care Team: Mohan N Harwani, MD as PCP - General (Cardiology)  , MD as Consulting Physician (Hematology) 05/22/2014  CHIEF COMPLAINTS Follow-up CML, chronic phase     CML (chronic myelocytic leukemia)   03/06/2014 Initial Diagnosis CML (chronic myelocytic leukemia), WBC 57K with ANC 46.7K, plt 824K.    03/19/2014 Miscellaneous Peripheral blood PCR/ABL P210 (+), P190(-), PCR IS 69%.    03/20/2014 Bone Marrow Biopsy hypercellular marrow, c/w CML, no blasts, FISH(+) t(9,22)     CURRENT THERAPY: Gleevec 400mg daily started on 03/28/14, stopped 1/20-2/10/16, dose reduced to 200mg daily on 05/23/2014  RESPONSE EVALUATION: 1. Complete hematological response: achieved in 3-4 weeks 2. Molecular response  BCR/ABL p210 PCR (IS) 03/20/14: 69.154% 05/13/2014: 45.80%  HISTORY OF INITIAL PRESENTING ILLNESS:  Bruce [name] 68 y.o. male with newly diagnosed CML, chronic phase. I initially saw him in the hospital, he is here for follow-up.   He presented to [place] ED with chest pain, left arm numbness on 03/06/2014, and was found to have leukocytosis with WBC 40 3.4K, ANC 30 4.3K, hemoglobin 11.7, and thrombocytosis with platelet count 775K. His cardiac workup was negative. Hematology was consulted,  But pt left AMA same day prior to further workup could be performed, including a bone marrow biopsy.  He again presented to the ED on 12/7 with bilateral hip and acute on chronic back pain. At the ED his WBC was 57,000 (ANC 46.7 abs, 5.8 lymphs and 2.9 monos), along with marked thrombocytosis at 824,000. I saw him in the hospital and recommended a bone marrow biopsy, which was done on 03/20/2014. I reviewed his peripheral smear with the pathologist Dr. Smir, and both of us felt he is on peripheral smear was most likely consistent with myeloproliferative neoplasm, likely CML.   He has  chronic right hip pain and low back pain, he had right hip surgery due to his injury from his work in 1996. He otherwise feels well, denies other pain, fever, chills, night sweats or other symptoms.  He does carry a history of strokes twice, in the setting of hypercholesterolemia and hypertension. He denies any slurred speech or gait instability. No vision changes. He is on daily ASA. No NSAIDs. He also consumes significant amount of alcohol.   INTERIM HISTORY: Bruce [name] returns for follow up. He developed body pain, insomnia, bad dreams, constipation with blood in stool 3-4 weeks ago, he felt that was related to Gleevec and he stopped taking it about 20 days ago. His symptoms did resolve after he stopping Gleevec. He feels well overall lately. He has chronic right hip pain which has changed much. No chest pain, dyspnea or abdominal discomfort.    MEDICAL HISTORY:  Past Medical History  Diagnosis Date  . Coronary artery disease   . Hypertension   . Hypercholesterolemia   . Stroke   . CML (chronic myelocytic leukemia)   . TIA (transient ischemic attack) 05/10/2014    SURGICAL HISTORY: Past Surgical History  Procedure Laterality Date  . Back surgery    . Hip arthroplasty Right     orif  . Orif forearm fracture Right     SOCIAL HISTORY: History   Social History  . Marital Status: Widowed    Spouse Name: N/A    Number of Children: No   . Years of Education: N/A   Occupational History  . Retired, used to work as a shipping clerk      Social History Main Topics  . Smoking status: Current Every Day Smoker    Types: Cigarettes  . Smokeless tobacco: Not on file  . Alcohol Use: Yes  . Drug Use: No  . Sexual Activity: Not on file   Other Topics Concern  . Not on file   Social History Narrative    FAMILY HISTORY: Family History  Problem Relation Age of Onset  . Diabetes Mother   . Hypertension Mother   . Diabetes Father   . Hypertension Father   . Diabetes Brother   .  Hypertension Brother   . Diabetes Sister   . Hypertension Sister   . Diabetes Brother   . Hypertension Brother   . Diabetes Sister   . Hypertension Sister     ALLERGIES:  has No Known Allergies.  MEDICATIONS:  Current Outpatient Prescriptions  Medication Sig Dispense Refill  . aspirin 81 MG tablet Take 81 mg by mouth daily.    . atorvastatin (LIPITOR) 40 MG tablet Take 40 mg by mouth every morning.   0  . carvedilol (COREG) 3.125 MG tablet Take 3.125 mg by mouth 2 (two) times daily.  0  . clopidogrel (PLAVIX) 75 MG tablet Take 1 tablet (75 mg total) by mouth daily. (Patient not taking: Reported on 05/22/2014) 30 tablet 3  . imatinib (GLEEVEC) 400 MG tablet Take 1 tablet (400 mg total) by mouth daily. Take with meals and large glass of water.Caution:Chemotherapy. (Patient not taking: Reported on 05/22/2014) 30 tablet 5  . lisinopril (PRINIVIL,ZESTRIL) 20 MG tablet Take 20 mg by mouth daily.  0  . oxyCODONE-acetaminophen (PERCOCET) 10-325 MG per tablet Take 1 tablet by mouth 2 (two) times daily.  0   No current facility-administered medications for this visit.    REVIEW OF SYSTEMS:   Constitutional: Denies fevers, chills or abnormal night sweats Eyes: Denies blurriness of vision, double vision or watery eyes Ears, nose, mouth, throat, and face: Denies mucositis or sore throat Respiratory: Denies cough, dyspnea or wheezes Cardiovascular: Denies palpitation, chest discomfort or lower extremity swelling Gastrointestinal:  Denies nausea, heartburn or change in bowel habits Skin: Denies abnormal skin rashes Lymphatics: Denies new lymphadenopathy or easy bruising Neurological:Denies numbness, tingling or new weaknesses Behavioral/Psych: Mood is stable, no new changes  All other systems were reviewed with the patient and are negative.  PHYSICAL EXAMINATION: ECOG PERFORMANCE STATUS: 1 - Symptomatic but completely ambulatory  Filed Vitals:   05/22/14 0938  BP: 156/83  Pulse: 80  Temp:  98.5 F (36.9 C)  Resp: 18   Filed Weights   05/22/14 0938  Weight: 188 lb 11.2 oz (85.594 kg)    GENERAL:alert, no distress and comfortable SKIN: skin color, texture, turgor are normal, no rashes or significant lesions EYES: normal, conjunctiva are pink and non-injected, sclera clear OROPHARYNX:no exudate, no erythema and lips, buccal mucosa, and tongue normal  NECK: supple, thyroid normal size, non-tender, without nodularity LYMPH:  no palpable lymphadenopathy in the cervical, axillary or inguinal LUNGS: clear to auscultation and percussion with normal breathing effort HEART: regular rate & rhythm and no murmurs and no lower extremity edema ABDOMEN:abdomen soft, non-tender and normal bowel sounds Musculoskeletal:no cyanosis of digits and no clubbing  PSYCH: alert & oriented x 3 with fluent speech NEURO: no focal motor/sensory deficits  LABORATORY DATA:  I have reviewed the data as listed Lab Results  Component Value Date   WBC 5.2 05/13/2014   HGB 8.9* 05/13/2014   HCT 28.3* 05/13/2014   MCV 95.0 05/13/2014     PLT 523* 05/13/2014    Recent Labs  03/18/14 1458 03/19/14 0635 03/21/14 0351 04/22/14 1048 05/13/14 0937  NA 137 141 139 138 140  K 4.2 4.0 4.0 4.5 3.5  CL 99 102 102  --   --   CO2 22 25 27 28 22  GLUCOSE 205* 105* 104* 161* 210*  BUN 13 10 10 14.6 9.1  CREATININE 1.04 1.07 1.11 1.2 1.0  CALCIUM 9.5 8.7 8.9 9.0 8.9  GFRNONAA 72* 69* 66*  --   --   GFRAA 83* 80* 77*  --   --   PROT 8.2  --   --  7.3 7.7  ALBUMIN 4.2  --   --  3.9 3.6  AST 25  --   --  14 20  ALT 21  --   --  15 32  ALKPHOS 91  --   --  112 175*  BILITOT 0.2*  --   --  0.29 0.44    Diagnosis 03/20/2014 Bone Marrow, Aspirate,Biopsy, and Clot, L iliac - HYPERCELLULAR BONE MARROW WITH CHRONIC MYELOGENOUS LEUKEMIA. - SEE COMMENT. PERIPHERAL BLOOD: - CHRONIC MYELOGENOUS LEUKEMIA - NORMOCYTIC-NORMOCHROMIC ANEMIA - THROMBOCYTOSIS. Diagnosis Note FISH for t(9;22) is positive. The  morphologic and molecular cytogenetic features are consistent with chronic myelogenous leukemia.  RADIOGRAPHIC STUDIES: No new scans     ASSESSMENT & PLAN:  68-year-old African-American male, with past medical history of hypertension, hypercholesterolemia, history of stroke twice, right hip surgery, was found to have significant elevated WBC and thrombocytosis.   1. Chronic myelogenous leukemia (CML), chronic phase -He has significant elevated WBC, His peripheral blood and bone marrow showed chromosome 9 and 22 translocation, which is consistent with CML. His bone marrow did not reveal any blasts, he is in chronic phase. -the nature history of CML was discussed. This is very treatable disease with tyrosine kinase inhibitor, however it may transformed to acute leukemia in the future. -He could not tolerate Gleevec 400 mg daily due to side effects, he is willing to try 200 mg once daily. I discussed [facility] and was told to okay to cut that the Gleevec pill. He will restart at 200 mg once daily tomorrow. -Due to his poor tolerance to Gleevec, I would change you back to dasatinib 100 mg once daily. Prescription was sent for preauthorization today.  2. Hypertension, hypercholesterolemia, history of stroke -He will continue his current medication and follow up with his primary care physician.    Follow-up: Return to clinic in 4 weeks with repeat lab CBC and differential. We'll repeat BCR/ABL PCR every 3 months.  Plan: -change to Gleevec 200mg daily, oK to cut the pill, per [facility]  -will change him to dasatinib 100mg daily  -RTC in one month   All questions were answered. The patient knows to call the clinic with any problems, questions or concerns. I spent 20 minutes counseling the patient face to face. The total time spent in the appointment was 25 minutes and more than 50% was on counseling.     , , MD 05/22/2014 10:01 AM   

## 2014-05-22 NOTE — Telephone Encounter (Signed)
Gave avs & calendar for March. °

## 2014-06-20 ENCOUNTER — Telehealth: Payer: Self-pay | Admitting: *Deleted

## 2014-06-20 DIAGNOSIS — E785 Hyperlipidemia, unspecified: Secondary | ICD-10-CM | POA: Diagnosis not present

## 2014-06-20 DIAGNOSIS — I2510 Atherosclerotic heart disease of native coronary artery without angina pectoris: Secondary | ICD-10-CM | POA: Diagnosis not present

## 2014-06-20 DIAGNOSIS — I10 Essential (primary) hypertension: Secondary | ICD-10-CM | POA: Diagnosis not present

## 2014-06-20 DIAGNOSIS — I5020 Unspecified systolic (congestive) heart failure: Secondary | ICD-10-CM | POA: Diagnosis not present

## 2014-06-20 DIAGNOSIS — F17290 Nicotine dependence, other tobacco product, uncomplicated: Secondary | ICD-10-CM | POA: Diagnosis not present

## 2014-06-20 NOTE — Telephone Encounter (Signed)
Called pt at home unsuccessfully - not accepting calls.  Called pt on cell phone and left message on voice mail requesting a call back to update nurse on status of Dasatinib.

## 2014-06-21 ENCOUNTER — Telehealth: Payer: Self-pay | Admitting: *Deleted

## 2014-06-21 NOTE — Telephone Encounter (Signed)
Left message for pt to call back regarding sprycel.  Need to know if pt ever picked up.  Checked with [facility] & was informed that they were still holding med & looked like zero copay.

## 2014-06-21 NOTE — Telephone Encounter (Signed)
Left 2nd message for pt to call back to see if he has stopped his gleevec & if he plans to p/u the sprycel.

## 2014-06-26 ENCOUNTER — Encounter: Payer: Self-pay | Admitting: Hematology

## 2014-06-26 ENCOUNTER — Telehealth: Payer: Self-pay | Admitting: Hematology

## 2014-06-26 ENCOUNTER — Ambulatory Visit (HOSPITAL_BASED_OUTPATIENT_CLINIC_OR_DEPARTMENT_OTHER): Payer: Medicare Other | Admitting: Hematology

## 2014-06-26 ENCOUNTER — Other Ambulatory Visit (HOSPITAL_BASED_OUTPATIENT_CLINIC_OR_DEPARTMENT_OTHER): Payer: Medicare Other

## 2014-06-26 VITALS — BP 150/76 | HR 83 | Temp 98.0°F | Resp 18 | Ht 68.0 in | Wt 187.3 lb

## 2014-06-26 DIAGNOSIS — I10 Essential (primary) hypertension: Secondary | ICD-10-CM

## 2014-06-26 DIAGNOSIS — C9210 Chronic myeloid leukemia, BCR/ABL-positive, not having achieved remission: Secondary | ICD-10-CM | POA: Diagnosis not present

## 2014-06-26 DIAGNOSIS — Z8673 Personal history of transient ischemic attack (TIA), and cerebral infarction without residual deficits: Secondary | ICD-10-CM

## 2014-06-26 DIAGNOSIS — E780 Pure hypercholesterolemia: Secondary | ICD-10-CM

## 2014-06-26 LAB — CBC WITH DIFFERENTIAL/PLATELET
BASO%: 1.6 % (ref 0.0–2.0)
BASOS ABS: 0.5 10*3/uL — AB (ref 0.0–0.1)
EOS%: 2.2 % (ref 0.0–7.0)
Eosinophils Absolute: 0.7 10*3/uL — ABNORMAL HIGH (ref 0.0–0.5)
HEMATOCRIT: 34.6 % — AB (ref 38.4–49.9)
HEMOGLOBIN: 10.8 g/dL — AB (ref 13.0–17.1)
LYMPH%: 13 % — ABNORMAL LOW (ref 14.0–49.0)
MCH: 29.5 pg (ref 27.2–33.4)
MCHC: 31.2 g/dL — ABNORMAL LOW (ref 32.0–36.0)
MCV: 94.5 fL (ref 79.3–98.0)
MONO#: 1.6 10*3/uL — ABNORMAL HIGH (ref 0.1–0.9)
MONO%: 5.2 % (ref 0.0–14.0)
NEUT%: 78 % — AB (ref 39.0–75.0)
NEUTROS ABS: 24.1 10*3/uL — AB (ref 1.5–6.5)
Platelets: 859 10*3/uL — ABNORMAL HIGH (ref 140–400)
RBC: 3.66 10*6/uL — ABNORMAL LOW (ref 4.20–5.82)
RDW: 16.9 % — ABNORMAL HIGH (ref 11.0–14.6)
WBC: 30.8 10*3/uL — ABNORMAL HIGH (ref 4.0–10.3)
lymph#: 4 10*3/uL — ABNORMAL HIGH (ref 0.9–3.3)
nRBC: 1 % — ABNORMAL HIGH (ref 0–0)

## 2014-06-26 LAB — COMPREHENSIVE METABOLIC PANEL (CC13)
ALK PHOS: 110 U/L (ref 40–150)
ALT: 17 U/L (ref 0–55)
AST: 15 U/L (ref 5–34)
Albumin: 3.6 g/dL (ref 3.5–5.0)
Anion Gap: 9 mEq/L (ref 3–11)
BILIRUBIN TOTAL: 0.47 mg/dL (ref 0.20–1.20)
BUN: 8.3 mg/dL (ref 7.0–26.0)
CO2: 25 mEq/L (ref 22–29)
CREATININE: 1.1 mg/dL (ref 0.7–1.3)
Calcium: 9 mg/dL (ref 8.4–10.4)
Chloride: 108 mEq/L (ref 98–109)
EGFR: 78 mL/min/{1.73_m2} — ABNORMAL LOW (ref >90)
Glucose: 174 mg/dl — ABNORMAL HIGH (ref 70–140)
POTASSIUM: 4.1 meq/L (ref 3.5–5.1)
SODIUM: 142 meq/L (ref 136–145)
TOTAL PROTEIN: 7.2 g/dL (ref 6.4–8.3)

## 2014-06-26 LAB — TECHNOLOGIST REVIEW

## 2014-06-26 LAB — LACTATE DEHYDROGENASE (CC13): LDH: 311 U/L — ABNORMAL HIGH (ref 125–245)

## 2014-06-26 MED ORDER — ALPRAZOLAM 1 MG PO TABS: 1.0000 mg | ORAL_TABLET | Freq: Every evening | ORAL | Status: DC | PRN

## 2014-06-26 NOTE — Telephone Encounter (Signed)
Gave and printed appt sched and avs for pt for April °

## 2014-06-26 NOTE — Progress Notes (Signed)
[name]  [phone] Fax:(336) Ball Note   Patient Care Team: [name], MD as PCP - General (Cardiology) Truitt Merle, MD as Consulting Physician (Hematology) 06/26/2014  CHIEF COMPLAINTS Follow-up CML, chronic phase     CML (chronic myelocytic leukemia)   03/06/2014 Initial Diagnosis CML (chronic myelocytic leukemia), WBC 57K with ANC 46.7K, plt 824K.    03/19/2014 Miscellaneous Peripheral blood PCR/ABL P210 (+), P190(-), PCR IS 69%.    03/20/2014 Bone Marrow Biopsy hypercellular marrow, c/w CML, no blasts, FISH(+) t(9,22)     CURRENT THERAPY:  1. Gleevec 442m daily started on 69/17/15, stopped 1/20-2/10/16 due to side effects, dose reduced to 2039mdaily on 05/23/2014 and stopped after one week by pt 2. Dasatinib (Sprycel) 1004maily started on 69/17/2016   RESPONSE EVALUATION: 1. Complete hematological response: achieved in 3-4 weeks 2. Molecular response  BCR/ABL p210 PCR (IS) 03/20/14: 69.154% 05/13/2014: 45.80%  HISTORY OF INITIAL PRESENTING ILLNESS:  Bruce [name] 69o. male with newly diagnosed CML, chronic phase. I initially saw him in the hospital, he is here for follow-up.   [facility], left arm numbness on 03/06/2014, and was found to have leukocytosis with WBC 40 3.4K, ANC 30 4.3K, hemoglobin 11.7, and thrombocytosis with platelet count 775K. His cardiac workup was negative. Hematology was consulted,  But pt left AMA same day prior to further workup could be performed, including a bone marrow biopsy.  He again presented to the ED on 12/7 with bilateral hip and acute on chronic back pain. At the ED his WBC was 57,000 (ANC 46.7 abs, 5.8 lymphs and 2.9 monos), along with marked thrombocytosis at 824,000. I saw him in the hospital and recommended a bone marrow biopsy, which was done on 03/20/2014. I reviewed his peripheral smear with the pathologist Dr. SmiGari Crownnd both of us [place]  he is on peripheral smear was most likely consistent with myeloproliferative neoplasm, likely CML.   He has chronic right hip pain and low back pain, he had right hip surgery due to his injury from his work in 1996. He otherwise feels well, denies other pain, fever, chills, night sweats or other symptoms.  He does carry a history of strokes twice, in the setting of hypercholesterolemia and hypertension. He denies any slurred speech or gait instability. No vision changes. He is on daily ASA. No NSAIDs. He also consumes significant amount of alcohol.   INTERIM HISTORY: [name] for follow up. He states he did try Gleevec half tablet once daily after he saw me last time. He stopped 1 week later due to the side effects again. " I feel ongoing dye when I'm on the pill. ". The prescription of descended was approved, but he never picked up and we were not able to reach him by phone. He returns for follow-up with his niece today. He feels better overall. He complains of insomnia and would like to refill his xanax. He was seen by his primary care physician last week, and his Coreg dose was increased.  MEDICAL HISTORY:  Past Medical History  Diagnosis Date  . Coronary artery disease   . Hypertension   . Hypercholesterolemia   . Stroke   . CML (chronic myelocytic leukemia)   . TIA (transient ischemic attack) 05/10/2014    SURGICAL HISTORY: Past Surgical History  Procedure Laterality Date  . Back surgery    . Hip arthroplasty Right     orif  .  Orif forearm fracture Right     SOCIAL HISTORY: History   Social History  . Marital Status: Widowed    Spouse Name: N/A    Number of Children: No   . Years of Education: N/A   Occupational History  . Retired, used to work as a Well[employer] geologist    Social History Main Topics  . Smoking status: Current Every Day Smoker    Types: Cigarettes  . Smokeless tobacco: Not on file  . Alcohol Use: Yes  . Drug Use: No  . Sexual Activity: Not on file    Other Topics Concern  . Not on file   Social History Narrative    FAMILY HISTORY: Family History  Problem Relation Age of Onset  . Diabetes Mother   . Hypertension Mother   . Diabetes Father   . Hypertension Father   . Diabetes Brother   . Hypertension Brother   . Diabetes Sister   . Hypertension Sister   . Diabetes Brother   . Hypertension Brother   . Diabetes Sister   . Hypertension Sister     ALLERGIES:  has No Known Allergies.  MEDICATIONS:  Current Outpatient Prescriptions  Medication Sig Dispense Refill  . ALPRAZolam (XANAX) 1 MG tablet Take 1 tablet (1 mg total) by mouth at bedtime as needed for anxiety. 30 tablet 2  . aspirin 81 MG tablet Take 81 mg by mouth daily.    Marland Kitchen atorvastatin (LIPITOR) 40 MG tablet Take 40 mg by mouth every morning.   0  . carvedilol (COREG) 6.25 MG tablet Take 6.25 mg by mouth 2 (two) times daily.  0  . clopidogrel (PLAVIX) 75 MG tablet Take 1 tablet (75 mg total) by mouth daily. 30 tablet 3  . lisinopril (PRINIVIL,ZESTRIL) 20 MG tablet Take 20 mg by mouth daily.  0  . oxyCODONE-acetaminophen (PERCOCET) 10-325 MG per tablet Take 1 tablet by mouth 2 (two) times daily.  0  . sildenafil (VIAGRA) 50 MG tablet Take 50 mg by mouth. Prn    . dasatinib (SPRYCEL) 100 MG tablet Take 1 tablet (100 mg total) by mouth daily. (Patient not taking: Reported on 06/26/2014) 30 tablet 2  . imatinib (GLEEVEC) 400 MG tablet Take 1 tablet (400 mg total) by mouth daily. Take with meals and large glass of water.Caution:Chemotherapy. (Patient not taking: Reported on 05/22/2014) 30 tablet 5   No current facility-administered medications for this visit.    REVIEW OF SYSTEMS:   Constitutional: Denies fevers, chills or abnormal night sweats Eyes: Denies blurriness of vision, double vision or watery eyes Ears, nose, mouth, throat, and face: Denies mucositis or sore throat Respiratory: Denies cough, dyspnea or wheezes Cardiovascular: Denies palpitation, chest  discomfort or lower extremity swelling Gastrointestinal:  Denies nausea, heartburn or change in bowel habits Skin: Denies abnormal skin rashes Lymphatics: Denies new lymphadenopathy or easy bruising Neurological:Denies numbness, tingling or new weaknesses Behavioral/Psych: Mood is stable, no new changes  All other systems were reviewed with the patient and are negative.  PHYSICAL EXAMINATION: ECOG PERFORMANCE STATUS: 1 - Symptomatic but completely ambulatory  Filed Vitals:   06/26/14 1053  BP: 150/76  Pulse: 83  Temp: 98 F (36.7 C)  Resp: 18   Filed Weights   06/26/14 1053  Weight: 187 lb 4.8 oz (84.959 kg)    GENERAL:alert, no distress and comfortable SKIN: skin color, texture, turgor are normal, no rashes or significant lesions EYES: normal, conjunctiva are pink and non-injected, sclera clear OROPHARYNX:no exudate, no erythema and lips,  buccal mucosa, and tongue normal  NECK: supple, thyroid normal size, non-tender, without nodularity LYMPH:  no palpable lymphadenopathy in the cervical, axillary or inguinal LUNGS: clear to auscultation and percussion with normal breathing effort HEART: regular rate & rhythm and no murmurs and no lower extremity edema ABDOMEN:abdomen soft, non-tender and normal bowel sounds Musculoskeletal:no cyanosis of digits and no clubbing  PSYCH: alert & oriented x 3 with fluent speech NEURO: no focal motor/sensory deficits  LABORATORY DATA:  CBC Latest Ref Rng 06/26/2014 05/13/2014 04/22/2014  WBC 4.0 - 10.3 10e3/uL 30.8(H) 5.2 4.7  Hemoglobin 13.0 - 17.1 g/dL 10.8(L) 8.9(L) 10.3(L)  Hematocrit 38.4 - 49.9 % 34.6(L) 28.3(L) 32.2(L)  Platelets 140 - 400 10e3/uL 859(H) 523(H) 575(H)    CMP Latest Ref Rng 06/26/2014 05/13/2014 04/22/2014  Glucose 70 - 140 mg/dl 174(H) 210(H) 161(H)  BUN 7.0 - 26.0 mg/dL 8.3 9.1 14.6  Creatinine 0.7 - 1.3 mg/dL 1.1 1.0 1.2  Sodium 136 - 145 mEq/L 142 140 138  Potassium 3.5 - 5.1 mEq/L 4.1 3.5 4.5  Chloride 96 - 112  mEq/L - - -  CO2 22 - 29 mEq/L _0 Calcium 8.4 - 10.4 mg/dL 9.0 8.9 9.0  Total Protein 6.4 - 8.3 g/dL 7.2 7.7 7.3  Total Bilirubin 0.20 - 1.20 mg/dL 0.47 0.44 0.29  Alkaline Phos 40 - 150 U/L 110 175(H) 112  AST 5 - 34 U/L _1 ALT 0 - 55 U/L 17 32 15     Diagnosis 03/20/2014 Bone Marrow, Aspirate,Biopsy, and Clot, L iliac - HYPERCELLULAR BONE MARROW WITH CHRONIC MYELOGENOUS LEUKEMIA. - SEE COMMENT. PERIPHERAL BLOOD: - CHRONIC MYELOGENOUS LEUKEMIA - NORMOCYTIC-NORMOCHROMIC ANEMIA - THROMBOCYTOSIS. Diagnosis Note FISH for t(9;22) is positive. The morphologic and molecular cytogenetic features are consistent with chronic myelogenous leukemia.  RADIOGRAPHIC STUDIES: No new scans    ASSESSMENT & PLAN:  69 year old African-American male, with past medical history of hypertension, hypercholesterolemia, history of stroke twice, right hip surgery, was found to have significant elevated WBC and thrombocytosis.   1. Chronic myelogenous leukemia (CML), chronic phase -He has significant elevated WBC, His peripheral blood and bone marrow showed chromosome 9 and 22 translocation, which is consistent with CML. His bone marrow did not reveal any blasts, he is in chronic phase. -the nature history of CML was discussed. This is very treatable disease with tyrosine kinase inhibitor, however it may transformed to acute leukemia in the future. -Due to his poor tolerance to Gleevec, I recommended change it to dasatinib 100 mg once daily. Side effects were discussed with him, especially pleural effusion and cardiac toxicities. -He will pick up the prescription at [facility] tomorrow and start tomorrow. -I reinforced the importance of compliance with medication.  2. Hypertension, hypercholesterolemia, history of stroke -He will continue his current medication and follow up with his primary care physician.  -I refilled his xanax today  Follow-up: Return to clinic in 3 weeks with  repeat lab CBC and differential. We'll repeat BCR/ABL PCR every 3 months.  Plan: -will change him to dasatinib 132m daily starting tomorrow, he will pick up the prescription tomorrow -RTC in 4 weeks    All questions were answered. The patient knows to call the clinic with any problems, questions or concerns. I spent 20 minutes counseling the patient face to face. The total time spent in the appointment was 25 minutes and more than 50% was on counseling.     FTruitt Merle MD 06/26/2014 11:28 AM

## 2014-07-18 ENCOUNTER — Telehealth: Payer: Self-pay | Admitting: *Deleted

## 2014-07-18 ENCOUNTER — Ambulatory Visit: Payer: Medicare Other | Admitting: Hematology

## 2014-07-18 ENCOUNTER — Other Ambulatory Visit: Payer: Medicare Other

## 2014-07-18 NOTE — Telephone Encounter (Signed)
Called pt to discuss missed appt.  Left VM to call back to r/s.  POF to scheduler to r/s ASAP.

## 2014-07-19 ENCOUNTER — Encounter: Payer: Self-pay | Admitting: Hematology

## 2014-07-19 NOTE — Progress Notes (Signed)
I spoke with the patient to confirm address because I could not get his sprycel auth. He said he is homeless now and trying to get a bed at the shelter. I spoke with Daryel Gerald and he said I can fax a letter (from dr) to him to confirm he is patient here and needs a bed because of treatment.  [phone] is his ph#   email the letter to [email or web address]. I sent message to dr and nurse.

## 2014-07-21 ENCOUNTER — Encounter (HOSPITAL_COMMUNITY): Payer: Self-pay | Admitting: Emergency Medicine

## 2014-07-21 DIAGNOSIS — I10 Essential (primary) hypertension: Secondary | ICD-10-CM | POA: Diagnosis not present

## 2014-07-21 DIAGNOSIS — R05 Cough: Secondary | ICD-10-CM | POA: Diagnosis not present

## 2014-07-21 DIAGNOSIS — I2510 Atherosclerotic heart disease of native coronary artery without angina pectoris: Secondary | ICD-10-CM | POA: Diagnosis not present

## 2014-07-21 DIAGNOSIS — R030 Elevated blood-pressure reading, without diagnosis of hypertension: Secondary | ICD-10-CM | POA: Diagnosis not present

## 2014-07-21 DIAGNOSIS — J069 Acute upper respiratory infection, unspecified: Secondary | ICD-10-CM | POA: Insufficient documentation

## 2014-07-21 DIAGNOSIS — Z7982 Long term (current) use of aspirin: Secondary | ICD-10-CM | POA: Insufficient documentation

## 2014-07-21 DIAGNOSIS — Z856 Personal history of leukemia: Secondary | ICD-10-CM | POA: Insufficient documentation

## 2014-07-21 DIAGNOSIS — Z8673 Personal history of transient ischemic attack (TIA), and cerebral infarction without residual deficits: Secondary | ICD-10-CM | POA: Insufficient documentation

## 2014-07-21 DIAGNOSIS — Z87891 Personal history of nicotine dependence: Secondary | ICD-10-CM | POA: Insufficient documentation

## 2014-07-21 DIAGNOSIS — Z7902 Long term (current) use of antithrombotics/antiplatelets: Secondary | ICD-10-CM | POA: Diagnosis not present

## 2014-07-21 DIAGNOSIS — F17210 Nicotine dependence, cigarettes, uncomplicated: Secondary | ICD-10-CM | POA: Diagnosis not present

## 2014-07-21 DIAGNOSIS — E780 Pure hypercholesterolemia: Secondary | ICD-10-CM | POA: Diagnosis not present

## 2014-07-21 DIAGNOSIS — R0602 Shortness of breath: Secondary | ICD-10-CM | POA: Diagnosis not present

## 2014-07-21 DIAGNOSIS — Z79899 Other long term (current) drug therapy: Secondary | ICD-10-CM | POA: Diagnosis not present

## 2014-07-21 DIAGNOSIS — R000 Tachycardia, unspecified: Secondary | ICD-10-CM | POA: Diagnosis not present

## 2014-07-21 LAB — CBC
HCT: 32.4 % — ABNORMAL LOW (ref 39.0–52.0)
HEMOGLOBIN: 10.3 g/dL — AB (ref 13.0–17.0)
MCH: 29.3 pg (ref 26.0–34.0)
MCHC: 31.8 g/dL (ref 30.0–36.0)
MCV: 92.3 fL (ref 78.0–100.0)
PLATELETS: 597 10*3/uL — AB (ref 150–400)
RBC: 3.51 MIL/uL — ABNORMAL LOW (ref 4.22–5.81)
RDW: 16.7 % — AB (ref 11.5–15.5)
WBC: 11.4 10*3/uL — ABNORMAL HIGH (ref 4.0–10.5)

## 2014-07-21 LAB — I-STAT TROPONIN, ED: TROPONIN I, POC: 0.03 ng/mL (ref 0.00–0.08)

## 2014-07-21 NOTE — ED Notes (Signed)
Per ems-- pt c/o cough x 3 days, no fever. Pt also lost medications 2 weeks ago. Pt told ems that he has lukemia but missed his apt Friday. Pt c/o L arm numbness x 3 days as well. No drift noted. Pt a&ox4.

## 2014-07-22 ENCOUNTER — Emergency Department (HOSPITAL_COMMUNITY)
Admission: EM | Admit: 2014-07-22 | Discharge: 2014-07-22 | Disposition: A | Discharge status: Home / Self Care | Payer: Medicare Other | Attending: Emergency Medicine | Admitting: Emergency Medicine

## 2014-07-22 ENCOUNTER — Emergency Department (HOSPITAL_COMMUNITY): Payer: Medicare Other

## 2014-07-22 DIAGNOSIS — R05 Cough: Secondary | ICD-10-CM

## 2014-07-22 DIAGNOSIS — R059 Cough, unspecified: Secondary | ICD-10-CM

## 2014-07-22 DIAGNOSIS — J069 Acute upper respiratory infection, unspecified: Secondary | ICD-10-CM

## 2014-07-22 DIAGNOSIS — R0602 Shortness of breath: Secondary | ICD-10-CM | POA: Diagnosis not present

## 2014-07-22 LAB — BASIC METABOLIC PANEL
ANION GAP: 7 (ref 5–15)
BUN: 9 mg/dL (ref 6–23)
CHLORIDE: 104 mmol/L (ref 96–112)
CO2: 29 mmol/L (ref 19–32)
Calcium: 9.1 mg/dL (ref 8.4–10.5)
Creatinine, Ser: 1.17 mg/dL (ref 0.50–1.35)
GFR calc non Af Amer: 62 mL/min — ABNORMAL LOW (ref >90)
GFR, EST AFRICAN AMERICAN: 72 mL/min — AB (ref >90)
Glucose, Bld: 145 mg/dL — ABNORMAL HIGH (ref 70–99)
POTASSIUM: 3.5 mmol/L (ref 3.5–5.1)
Sodium: 140 mmol/L (ref 135–145)

## 2014-07-22 MED ORDER — BENZONATATE 100 MG PO CAPS: 100.0000 mg | ORAL_CAPSULE | Freq: Three times a day (TID) | ORAL | Status: DC

## 2014-07-22 NOTE — ED Notes (Signed)
Patient states that he had two strokes previously and that his doctor told him a long time ago that his heart was weak so tonight he wanted to get checked out to make sure that everything was okay. He has had a dry cough for more than a week and a productive cough since Friday with green sputum.

## 2014-07-22 NOTE — ED Provider Notes (Signed)
CSN: [id number]     Arrival date & time 07/21/14  2256 History   First MD Initiated Contact with Patient 07/22/14 [phone]     Chief Complaint  Patient presents with  . Cough  . Numbness     (Consider location/radiation/quality/duration/timing/severity/associated sxs/prior Treatment) HPI Comments: 69 year old male, history of cerebrovascular disease, history of a stroke that involved his speech and his left upper extremity, he presents with several complaints, foremost being that he has had a cough, nasal congestion for the last week, states that it is persistent, the cough is dry and nonproductive, there is no associated fevers and chills. He also reports that he has had some intermittent left upper extremity tingling ever since one of his prior strokes, today he had an episode of tingling and weakness that he states lasted only several seconds and then totally resolved. He is not having symptoms at this time. He had no difficulty ambulating, no difficulty speaking or swallowing and has no other complaints at this time. He only complains of his cough. He is taking Plavix and aspirin, he does report intermittent blood in his stools, he is currently taking chemotherapy for leukemia.  Patient is a 69 y.o. male presenting with cough. The history is provided by the patient.  Cough   Past Medical History  Diagnosis Date  . Coronary artery disease   . Hypertension   . Hypercholesterolemia   . Stroke   . CML (chronic myelocytic leukemia)   . TIA (transient ischemic attack) 05/10/2014   Past Surgical History  Procedure Laterality Date  . Back surgery    . Hip arthroplasty Right     orif  . Orif forearm fracture Right    Family History  Problem Relation Age of Onset  . Diabetes Mother   . Hypertension Mother   . Diabetes Father   . Hypertension Father   . Diabetes Brother   . Hypertension Brother   . Diabetes Sister   . Hypertension Sister   . Diabetes Brother   . Hypertension Brother    . Diabetes Sister   . Hypertension Sister    History  Substance Use Topics  . Smoking status: Former Smoker -- 0.50 packs/day for 50 years    Types: Cigarettes  . Smokeless tobacco: Never Used  . Alcohol Use: 0.6 oz/week    1 Cans of beer per week     Comment: daily     Review of Systems  Respiratory: Positive for cough.   All other systems reviewed and are negative.     Allergies  Review of patient's allergies indicates no known allergies.  Home Medications   Prior to Admission medications   Medication Sig Start Date End Date Taking? Authorizing Provider  ALPRAZolam Duanne Moron) 1 MG tablet Take 1 tablet (1 mg total) by mouth at bedtime as needed for anxiety. 06/26/14  Yes Truitt Merle, MD  aspirin 81 MG tablet Take 81 mg by mouth daily.   Yes Historical Provider, MD  atorvastatin (LIPITOR) 40 MG tablet Take 40 mg by mouth every morning.  03/02/14  Yes Historical Provider, MD  carvedilol (COREG) 6.25 MG tablet Take 6.25 mg by mouth 2 (two) times daily. 06/22/14  Yes Historical Provider, MD  clopidogrel (PLAVIX) 75 MG tablet Take 1 tablet (75 mg total) by mouth daily. 03/21/14  Yes [name], MD  lisinopril (PRINIVIL,ZESTRIL) 20 MG tablet Take 20 mg by mouth daily. 03/02/14  Yes Historical Provider, MD  oxyCODONE-acetaminophen (PERCOCET) 10-325 MG per tablet Take 1 tablet  by mouth 2 (two) times daily. 03/22/14  Yes Historical Provider, MD  sildenafil (VIAGRA) 50 MG tablet Take 50 mg by mouth as needed for erectile dysfunction. Prn 04/27/13  Yes Historical Provider, MD  benzonatate (TESSALON) 100 MG capsule Take 1 capsule (100 mg total) by mouth every 8 (eight) hours. 07/22/14   Noemi Chapel, MD  dasatinib (SPRYCEL) 100 MG tablet Take 1 tablet (100 mg total) by mouth daily. Patient not taking: Reported on 06/26/2014 05/22/14   Truitt Merle, MD  imatinib (GLEEVEC) 400 MG tablet Take 1 tablet (400 mg total) by mouth daily. Take with meals and large glass of water.Caution:Chemotherapy. Patient  not taking: Reported on 05/22/2014 03/27/14   Truitt Merle, MD   BP 159/90 mmHg  Pulse 109  Temp(Src) 97.7 F (36.5 C)  Resp 18  SpO2 97% Physical Exam  Constitutional: He appears well-developed and well-nourished. No distress.  HENT:  Head: Normocephalic and atraumatic.  Mouth/Throat: Oropharynx is clear and moist. No oropharyngeal exudate.  Eyes: Conjunctivae and EOM are normal. Pupils are equal, round, and reactive to light. Right eye exhibits no discharge. Left eye exhibits no discharge. No scleral icterus.  Neck: Normal range of motion. Neck supple. No JVD present. No thyromegaly present.  Cardiovascular: Normal rate, regular rhythm, normal heart sounds and intact distal pulses.  Exam reveals no gallop and no friction rub.   No murmur heard. Pulmonary/Chest: Effort normal and breath sounds normal. No respiratory distress. He has no wheezes. He has no rales.  Abdominal: Soft. Bowel sounds are normal. He exhibits no distension and no mass. There is no tenderness.  Musculoskeletal: Normal range of motion. He exhibits no edema or tenderness.  Lymphadenopathy:    He has no cervical adenopathy.  Neurological: He is alert. Coordination normal.  Normal speech, normal coordination, normal finger-nose-finger, no pronator drift, normal strength in all 4 extremities, follows commands without difficulty, normal sensation to light touch in the bilateral upper extremities, normal gait, cranial nerves III through XII intact. Visual fields intact.  Skin: Skin is warm and dry. No rash noted. No erythema.  Psychiatric: He has a normal mood and affect. His behavior is normal.  Nursing note and vitals reviewed.   ED Course  Procedures (including critical care time) Labs Review Labs Reviewed  CBC - Abnormal; Notable for the following:    WBC 11.4 (*)    RBC 3.51 (*)    Hemoglobin 10.3 (*)    HCT 32.4 (*)    RDW 16.7 (*)    Platelets 597 (*)    All other components within normal limits  BASIC  METABOLIC PANEL - Abnormal; Notable for the following:    Glucose, Bld 145 (*)    GFR calc non Af Amer 62 (*)    GFR calc Af Amer 72 (*)    All other components within normal limits  I-STAT TROPOININ, ED  POC OCCULT BLOOD, ED    Imaging Review Dg Chest 2 View  07/22/2014   CLINICAL DATA:  Cough and shortness of breath for 1 week.  EXAM: CHEST  2 VIEW  COMPARISON:  03/05/2014  FINDINGS: The cardiomediastinal contours are normal. The lungs are clear. Pulmonary vasculature is normal. No consolidation, pleural effusion, or pneumothorax. No acute osseous abnormalities are seen.  IMPRESSION: No acute pulmonary process.   Electronically Signed   By: Jeb Levering M.D.   On: 07/22/2014 02:56     EKG Interpretation   Date/Time:  Sunday July 21 2014 23:12:57 EDT Ventricular Rate:  106 PR Interval:  144 QRS Duration: 84 QT Interval:  342 QTC Calculation: 454 R Axis:   62 Text Interpretation:  Sinus tachycardia Septal infarct , age undetermined  Abnormal ECG Since last tracing rate faster Confirmed by Shuayb Schepers  MD, Ariana Cavenaugh  [phone]) on 07/22/2014 1:30:22 AM      MDM   Final diagnoses:  URI (upper respiratory infection)    The patient has no fever, no hypoxia, he is not tachycardic on my exam, his labs show a slight leukocytosis however with his history of leukemia this is actually improved, he is currently on chemotherapy. His EKG is unremarkable, his chest x-ray is pending. He possibly has bronchitis or upper respiratory infection, doubt pneumonia, his neurologic complaints have been intermittent over time and a currently totally resolved without any signs of weakness or numbness, he is already taking Plavix, at this time I would recommend the patient pursue outpatient follow-up for those neurologic conditions. He will need rectal exam and Hemoccult for his intermittent rectal bleeding though review of the lab work shows no significant anemia compared to prior labs.  CXR negative, pt stable,  VS normal, ambulated without difficulty.  Meds given in ED:  Medications - No data to display  New Prescriptions   BENZONATATE (TESSALON) 100 MG CAPSULE    Take 1 capsule (100 mg total) by mouth every 8 (eight) hours.      Noemi Chapel, MD 07/22/14 [phone]

## 2014-07-22 NOTE — ED Notes (Signed)
Patient requested to speak to the MD prior to discharge for discharge instructions. MD was notified. Discharge instructions were already covered and patient refused to sign until he had seen MD.

## 2014-07-22 NOTE — ED Notes (Signed)
MD visited with patient and patient left without signing discharge instructions

## 2014-07-22 NOTE — Discharge Instructions (Signed)
Cough, Adult   A cough is a reflex. It helps you clear your throat and airways. A cough can help heal your body. A cough can last 2 or 3 weeks (acute) or may last more than 8 weeks (chronic). Some common causes of a cough can include an infection, allergy, or a cold.  HOME CARE  · Only take medicine as told by your doctor.  · If given, take your medicines (antibiotics) as told. Finish them even if you start to feel better.  · Use a cold steam vaporizer or humidifier in your home. This can help loosen thick spit (secretions).  · Sleep so you are almost sitting up (semi-upright). Use pillows to do this. This helps reduce coughing.  · Rest as needed.  · Stop smoking if you smoke.  GET HELP RIGHT AWAY IF:  · You have yellowish-white fluid (pus) in your thick spit.  · Your cough gets worse.  · Your medicine does not reduce coughing, and you are losing sleep.  · You cough up blood.  · You have trouble breathing.  · Your pain gets worse and medicine does not help.  · You have a fever.  MAKE SURE YOU:   · Understand these instructions.  · Will watch your condition.  · Will get help right away if you are not doing well or get worse.  Document Released: 12/10/2010 Document Revised: 08/13/2013 Document Reviewed: 12/10/2010  ExitCare® Patient Information ©2015 [employer]. This information is not intended to replace advice given to you by your health care provider. Make sure you discuss any questions you have with your health care provider.

## 2014-07-22 NOTE — ED Notes (Signed)
Patient states he understands to follow up with Cardiologist this week and he went on to state he will also go to his PCP. Prescriptions were reviewed. Requested MD to come back in for a few more questions. MD informed and follow up reviewed.

## 2014-07-23 DIAGNOSIS — G894 Chronic pain syndrome: Secondary | ICD-10-CM | POA: Diagnosis not present

## 2014-07-23 DIAGNOSIS — I2510 Atherosclerotic heart disease of native coronary artery without angina pectoris: Secondary | ICD-10-CM | POA: Diagnosis not present

## 2014-07-23 DIAGNOSIS — I10 Essential (primary) hypertension: Secondary | ICD-10-CM | POA: Diagnosis not present

## 2014-07-23 DIAGNOSIS — E785 Hyperlipidemia, unspecified: Secondary | ICD-10-CM | POA: Diagnosis not present

## 2014-07-23 DIAGNOSIS — I5020 Unspecified systolic (congestive) heart failure: Secondary | ICD-10-CM | POA: Diagnosis not present

## 2014-07-24 ENCOUNTER — Encounter: Payer: Self-pay | Admitting: Hematology

## 2014-07-24 NOTE — Progress Notes (Signed)
Per director Daryel Gerald ok to send letter(Dr. Burr Medico) said to send her last notes. The patient is trying to get a bed. He no longer has insurance, not working and no where to live. I will see if can get asst with [facility].

## 2014-07-25 ENCOUNTER — Ambulatory Visit: Payer: Medicare Other | Admitting: Hematology

## 2014-07-25 ENCOUNTER — Other Ambulatory Visit: Payer: Medicare Other

## 2014-07-29 ENCOUNTER — Telehealth: Payer: Self-pay | Admitting: *Deleted

## 2014-07-29 ENCOUNTER — Telehealth: Payer: Self-pay | Admitting: Hematology

## 2014-07-29 NOTE — Telephone Encounter (Signed)
S/w pt confirming labs/ov r/s from 04/14.... Bruce [name]

## 2014-07-29 NOTE — Telephone Encounter (Signed)
TC from patient requesting an appt to see Dr. Burr Medico. Transferred to [employer] in scheduling.

## 2014-07-31 ENCOUNTER — Encounter (HOSPITAL_COMMUNITY): Payer: Self-pay

## 2014-07-31 ENCOUNTER — Telehealth: Payer: Self-pay | Admitting: *Deleted

## 2014-07-31 ENCOUNTER — Emergency Department (HOSPITAL_COMMUNITY): Payer: Medicare Other

## 2014-07-31 ENCOUNTER — Encounter: Payer: Self-pay | Admitting: Hematology

## 2014-07-31 ENCOUNTER — Emergency Department (HOSPITAL_COMMUNITY)
Admission: EM | Admit: 2014-07-31 | Discharge: 2014-07-31 | Discharge status: Against Medical Advice | Payer: Medicare Other | Attending: Emergency Medicine | Admitting: Emergency Medicine

## 2014-07-31 ENCOUNTER — Ambulatory Visit (HOSPITAL_BASED_OUTPATIENT_CLINIC_OR_DEPARTMENT_OTHER): Payer: Medicare Other | Admitting: Hematology

## 2014-07-31 ENCOUNTER — Telehealth: Payer: Self-pay | Admitting: Hematology

## 2014-07-31 ENCOUNTER — Other Ambulatory Visit: Payer: Self-pay | Admitting: *Deleted

## 2014-07-31 ENCOUNTER — Other Ambulatory Visit (HOSPITAL_BASED_OUTPATIENT_CLINIC_OR_DEPARTMENT_OTHER): Payer: Medicare Other

## 2014-07-31 VITALS — BP 134/73 | HR 94 | Temp 97.8°F | Resp 18 | Ht 68.0 in | Wt 190.1 lb

## 2014-07-31 DIAGNOSIS — Z8673 Personal history of transient ischemic attack (TIA), and cerebral infarction without residual deficits: Secondary | ICD-10-CM

## 2014-07-31 DIAGNOSIS — M7989 Other specified soft tissue disorders: Secondary | ICD-10-CM | POA: Diagnosis not present

## 2014-07-31 DIAGNOSIS — R748 Abnormal levels of other serum enzymes: Secondary | ICD-10-CM | POA: Diagnosis not present

## 2014-07-31 DIAGNOSIS — Z87891 Personal history of nicotine dependence: Secondary | ICD-10-CM | POA: Diagnosis not present

## 2014-07-31 DIAGNOSIS — I2510 Atherosclerotic heart disease of native coronary artery without angina pectoris: Secondary | ICD-10-CM | POA: Diagnosis not present

## 2014-07-31 DIAGNOSIS — R0602 Shortness of breath: Secondary | ICD-10-CM | POA: Insufficient documentation

## 2014-07-31 DIAGNOSIS — I10 Essential (primary) hypertension: Secondary | ICD-10-CM | POA: Diagnosis not present

## 2014-07-31 DIAGNOSIS — C9210 Chronic myeloid leukemia, BCR/ABL-positive, not having achieved remission: Secondary | ICD-10-CM

## 2014-07-31 DIAGNOSIS — E780 Pure hypercholesterolemia: Secondary | ICD-10-CM | POA: Diagnosis not present

## 2014-07-31 LAB — CBC WITH DIFFERENTIAL/PLATELET
BASO%: 0.1 % (ref 0.0–2.0)
BASOS ABS: 0 10*3/uL (ref 0.0–0.1)
EOS%: 1.9 % (ref 0.0–7.0)
Eosinophils Absolute: 0.4 10*3/uL (ref 0.0–0.5)
HCT: 32.9 % — ABNORMAL LOW (ref 38.4–49.9)
HGB: 10.3 g/dL — ABNORMAL LOW (ref 13.0–17.1)
LYMPH#: 3.6 10*3/uL — AB (ref 0.9–3.3)
LYMPH%: 16.8 % (ref 14.0–49.0)
MCH: 28.9 pg (ref 27.2–33.4)
MCHC: 31.4 g/dL — AB (ref 32.0–36.0)
MCV: 91.9 fL (ref 79.3–98.0)
MONO#: 1.2 10*3/uL — ABNORMAL HIGH (ref 0.1–0.9)
MONO%: 5.9 % (ref 0.0–14.0)
NEUT#: 16 10*3/uL — ABNORMAL HIGH (ref 1.5–6.5)
NEUT%: 75.3 % — AB (ref 39.0–75.0)
Platelets: 831 10*3/uL — ABNORMAL HIGH (ref 140–400)
RBC: 3.58 10*6/uL — ABNORMAL LOW (ref 4.20–5.82)
RDW: 17.3 % — ABNORMAL HIGH (ref 11.0–14.6)
WBC: 21.3 10*3/uL — AB (ref 4.0–10.3)

## 2014-07-31 LAB — COMPREHENSIVE METABOLIC PANEL (CC13)
ALT: 87 U/L — ABNORMAL HIGH (ref 0–55)
ANION GAP: 10 meq/L (ref 3–11)
AST: 64 U/L — ABNORMAL HIGH (ref 5–34)
Albumin: 3.6 g/dL (ref 3.5–5.0)
Alkaline Phosphatase: 141 U/L (ref 40–150)
BILIRUBIN TOTAL: 0.42 mg/dL (ref 0.20–1.20)
BUN: 18.3 mg/dL (ref 7.0–26.0)
CO2: 22 meq/L (ref 22–29)
Calcium: 9.2 mg/dL (ref 8.4–10.4)
Chloride: 110 mEq/L — ABNORMAL HIGH (ref 98–109)
Creatinine: 1.2 mg/dL (ref 0.7–1.3)
EGFR: 75 mL/min/{1.73_m2} — AB (ref >90)
GLUCOSE: 133 mg/dL (ref 70–140)
Potassium: 4.3 mEq/L (ref 3.5–5.1)
SODIUM: 142 meq/L (ref 136–145)
TOTAL PROTEIN: 6.9 g/dL (ref 6.4–8.3)

## 2014-07-31 LAB — LACTATE DEHYDROGENASE (CC13): LDH: 366 U/L — ABNORMAL HIGH (ref 125–245)

## 2014-07-31 LAB — TECHNOLOGIST REVIEW

## 2014-07-31 MED ORDER — FUROSEMIDE 20 MG PO TABS: 20.0000 mg | ORAL_TABLET | Freq: Every day | ORAL | Status: DC

## 2014-07-31 MED ORDER — ALPRAZOLAM 1 MG PO TABS: 1.0000 mg | ORAL_TABLET | Freq: Every evening | ORAL | Status: DC | PRN

## 2014-07-31 MED ORDER — POTASSIUM CHLORIDE CRYS ER 20 MEQ PO TBCR: 20.0000 meq | EXTENDED_RELEASE_TABLET | Freq: Two times a day (BID) | ORAL | Status: DC

## 2014-07-31 NOTE — Progress Notes (Signed)
Bruce [name]  Telephone:([phone]) [phone] Fax:([phone]) [place] Note   Patient Care Team: [name], MD as PCP - General (Cardiology) Truitt Merle, MD as Consulting Physician (Hematology) 07/31/2014  CHIEF COMPLAINTS Follow-up CML, chronic phase     CML (chronic myelocytic leukemia)   03/06/2014 Initial Diagnosis CML (chronic myelocytic leukemia), WBC 57K with ANC 46.7K, plt 824K.    03/19/2014 Miscellaneous Peripheral blood PCR/ABL P210 (+), P190(-), PCR IS 69%.    03/20/2014 Bone Marrow Biopsy hypercellular marrow, c/w CML, no blasts, FISH(+) t(9,22)     CURRENT THERAPY:  1. Gleevec 481m daily started on 03/28/14, stopped 1/20-2/10/16 due to side effects, dose reduced to 2010mdaily on 05/23/2014 and stopped after one week by pt, and resumed at 20044mvery 2 days until 07/31/2014 2. Dasatinib ([facility]) 100m20mily started on 08/01/2014   RESPONSE EVALUATION: 1. Complete hematological response: achieved in 3-4 weeks 2. Molecular response  BCR/ABL p210 PCR (IS) 03/20/14: 69.154% 05/13/2014: 45.80%  HISTORY OF INITIAL PRESENTING ILLNESS:  Bruce [age]ALTHy11. male with newly diagnosed CML, chronic phase.  He is here for follow-up.   [facility], and was found to have leukocytosis with WBC 40 3.4K, ANC 30 4.3K, hemoglobin 11.7, and thrombocytosis with platelet count 775K. His cardiac workup was negative. Hematology was consulted,  But pt left AMA same day prior to further workup could be performed, including a bone marrow biopsy.  He again presented to the ED on 12/7 with bilateral hip and acute on chronic back pain. At the ED his WBC was 57,000 (ANC 46.7 abs, 5.8 lymphs and 2.9 monos), along with marked thrombocytosis at 824,000. I saw him in the hospital and recommended a bone marrow biopsy, which was done on 03/20/2014. I reviewed his peripheral smear with the pathologist Dr. SmirGari Crownd  both of us fKoreat he is on peripheral smear was most likely consistent with myeloproliferative neoplasm, likely CML.   He has chronic right hip pain and low back pain, he had right hip surgery due to his injury from his work in 1996. He otherwise feels well, denies other pain, fever, chills, night sweats or other symptoms.  He does carry a history of strokes twice, in the setting of hypercholesterolemia and hypertension. He denies any slurred speech or gait instability. No vision changes. He is on daily ASA. No NSAIDs. He also consumes significant amount of alcohol.   INTERIM HISTORY: Bruce [name] for follow up. He has been taking Gleevec half tab every other day. He lost his apartment because he was not able to pay the rent. He lives in a motel now. He went to ED on 4/10 for bronchitis and was treated with augmentin. He still has dry cough, but it's better overall. He also reports bilateral leg swelling for the past few weeks, and he went to ED before he came to my office, but did not want to wait too long, and worked out without being seen.   MEDICAL HISTORY:  Past Medical History  Diagnosis Date  . Coronary artery disease   . Hypertension   . Hypercholesterolemia   . Stroke   . CML (chronic myelocytic leukemia)   . TIA (transient ischemic attack) 05/10/2014    SURGICAL HISTORY: Past Surgical History  Procedure Laterality Date  . Back surgery    . Hip arthroplasty Right     orif  . Orif forearm fracture Right  SOCIAL HISTORY: History   Social History  . Marital Status: Widowed    Spouse Name: N/A    Number of Children: No   . Years of Education: N/A   Occupational History  . Retired, used to work as a Well[employer] geologist    Social History Main Topics  . Smoking status: Current Every Day Smoker    Types: Cigarettes  . Smokeless tobacco: Not on file  . Alcohol Use: Yes  . Drug Use: No  . Sexual Activity: Not on file   Other Topics Concern  . Not on file   Social  History Narrative    FAMILY HISTORY: Family History  Problem Relation Age of Onset  . Diabetes Mother   . Hypertension Mother   . Diabetes Father   . Hypertension Father   . Diabetes Brother   . Hypertension Brother   . Diabetes Sister   . Hypertension Sister   . Diabetes Brother   . Hypertension Brother   . Diabetes Sister   . Hypertension Sister     ALLERGIES:  has No Known Allergies.  MEDICATIONS:  Current Outpatient Prescriptions  Medication Sig Dispense Refill  . ALPRAZolam (XANAX) 1 MG tablet Take 1 tablet (1 mg total) by mouth at bedtime as needed for anxiety. 30 tablet 2  . amoxicillin (AMOXIL) 500 MG capsule   0  . aspirin 81 MG tablet Take 81 mg by mouth daily.    Marland Kitchen atorvastatin (LIPITOR) 40 MG tablet Take 40 mg by mouth every morning.   0  . benzonatate (TESSALON) 100 MG capsule Take 1 capsule (100 mg total) by mouth every 8 (eight) hours. 21 capsule 0  . carvedilol (COREG) 3.125 MG tablet Take 3.125 mg by mouth 2 (two) times daily.  0  . carvedilol (COREG) 6.25 MG tablet Take 6.25 mg by mouth 2 (two) times daily.  0  . clopidogrel (PLAVIX) 75 MG tablet Take 1 tablet (75 mg total) by mouth daily. 30 tablet 3  . imatinib (GLEEVEC) 400 MG tablet Take 1 tablet (400 mg total) by mouth daily. Take with meals and large glass of water.Caution:Chemotherapy. (Patient taking differently: Take 200 mg by mouth daily. Take with meals and large glass of water.Caution:Chemotherapy.) 30 tablet 5  . lisinopril (PRINIVIL,ZESTRIL) 20 MG tablet Take 20 mg by mouth daily.  0  . oxyCODONE-acetaminophen (PERCOCET/ROXICET) 5-325 MG per tablet   0   No current facility-administered medications for this visit.    REVIEW OF SYSTEMS:   Constitutional: Denies fevers, chills or abnormal night sweats Eyes: Denies blurriness of vision, double vision or watery eyes Ears, nose, mouth, throat, and face: Denies mucositis or sore throat Respiratory: Denies cough, dyspnea or wheezes Cardiovascular:  Denies palpitation, chest discomfort or lower extremity swelling Gastrointestinal:  Denies nausea, heartburn or change in bowel habits Skin: Denies abnormal skin rashes Lymphatics: Denies new lymphadenopathy or easy bruising Neurological:Denies numbness, tingling or new weaknesses Behavioral/Psych: Mood is stable, no new changes  All other systems were reviewed with the patient and are negative.  PHYSICAL EXAMINATION: ECOG PERFORMANCE STATUS: 1 - Symptomatic but completely ambulatory  Filed Vitals:   07/31/14 1308  BP: 134/73  Pulse: 94  Temp: 97.8 F (36.6 C)  Resp: 18   Filed Weights   07/31/14 1308  Weight: 190 lb 1.6 oz (86.229 kg)    GENERAL:alert, no distress and comfortable SKIN: skin color, texture, turgor are normal, no rashes or significant lesions EYES: normal, conjunctiva are pink and non-injected, sclera clear OROPHARYNX:no  exudate, no erythema and lips, buccal mucosa, and tongue normal  NECK: supple, thyroid normal size, non-tender, without nodularity LYMPH:  no palpable lymphadenopathy in the cervical, axillary or inguinal LUNGS: clear to auscultation and percussion with normal breathing effort HEART: regular rate & rhythm and no murmurs and no lower extremity edema ABDOMEN:abdomen soft, non-tender and normal bowel sounds Musculoskeletal:no cyanosis of digits and no clubbing  PSYCH: alert & oriented x 3 with fluent speech NEURO: no focal motor/sensory deficits LEGS: trace edema on bilateral low extremities above ankle  LABORATORY DATA:  CBC Latest Ref Rng 07/31/2014 07/21/2014 06/26/2014  WBC 4.0 - 10.3 10e3/uL 21.3(H) 11.4(H) 30.8(H)  Hemoglobin 13.0 - 17.1 g/dL 10.3(L) 10.3(L) 10.8(L)  Hematocrit 38.4 - 49.9 % 32.9(L) 32.4(L) 34.6(L)  Platelets 140 - 400 10e3/uL 831(H) 597(H) 859(H)    CMP Latest Ref Rng 07/31/2014 07/21/2014 06/26/2014  Glucose 70 - 140 mg/dl 133 145(H) 174(H)  BUN 7.0 - 26.0 mg/dL 18.3 9 8.3  Creatinine 0.7 - 1.3 mg/dL 1.2 1.17 1.1    Sodium 136 - 145 mEq/L 142 140 142  Potassium 3.5 - 5.1 mEq/L 4.3 3.5 4.1  Chloride 96 - 112 mmol/L - 104 -  CO2 22 - 29 mEq/L 22 29 25   Calcium 8.4 - 10.4 mg/dL 9.2 9.1 9.0  Total Protein 6.4 - 8.3 g/dL 6.9 - 7.2  Total Bilirubin 0.20 - 1.20 mg/dL 0.42 - 0.47  Alkaline Phos 40 - 150 U/L 141 - 110  AST 5 - 34 U/L 64(H) - 15  ALT 0 - 55 U/L 87(H) - 17     Diagnosis 03/20/2014 Bone Marrow, Aspirate,Biopsy, and Clot, L iliac - HYPERCELLULAR BONE MARROW WITH CHRONIC MYELOGENOUS LEUKEMIA. - SEE COMMENT. PERIPHERAL BLOOD: - CHRONIC MYELOGENOUS LEUKEMIA - NORMOCYTIC-NORMOCHROMIC ANEMIA - THROMBOCYTOSIS. Diagnosis Note FISH for t(9;22) is positive. The morphologic and molecular cytogenetic features are consistent with chronic myelogenous leukemia.  RADIOGRAPHIC STUDIES:  Chest x-ray 2 view 07/31/2014 IMPRESSION: Mild cardiomegaly and increased interstitial opacity are new compared to 2015 with trace pleural fluid. Favor mild/developing interstitial edema.  ASSESSMENT & PLAN:  69 year old African-American male, with past medical history of hypertension, hypercholesterolemia, history of stroke twice, right hip surgery, was found to have significant elevated WBC and thrombocytosis.   1. Chronic myelogenous leukemia (CML), chronic phase -He has significant elevated WBC, His peripheral blood and bone marrow showed chromosome 9 and 22 translocation, which is consistent with CML. His bone marrow did not reveal any blasts, he is in chronic phase. -the nature history of CML was discussed. This is very treatable disease with tyrosine kinase inhibitor, however it may transformed to acute leukemia in the future. -Due to his poor tolerance to Gleevec, he has cut the dose Down to 200 mg every other day. And his white count bounce back to 21.3K today. We'll stop his Gleevec today. -Desatinib was finally approved without co-pay, I told patient to pick up at [facility] today, and start  tomorrow. Potential side effects were reviewed with patient, especially cytopenia and fluid retention -I reinforced the importance of compliance with medication.  2. Leg swelling, possible mild pulmonary edema - possibly related to [place].  - I give him prescription of Lasix 20 mg once daily , along with potassium , for total of 10 days.   3. Hypertension, hypercholesterolemia, history of stroke -He will continue his current medication and follow up with his primary care physician.  -I refilled his xanax today  3. Mild elevated liver enzymes -Possibly related to  his medication, especially Gleevec. -We'll monitor his liver function closely.  Follow-up: Return to clinic in 2 weeks with repeat lab CBC and differential. We'll repeat BCR/ABL PCR every 3 months.  Plan: -will change him to dasatinib 147m daily starting tomorrow, he will pick up the prescription today -Lasix for 10 days -RTC in 2 weeks    All questions were answered. The patient knows to call the clinic with any problems, questions or concerns. I spent 20 minutes counseling the patient face to face. The total time spent in the appointment was 25 minutes and more than 50% was on counseling.     FTruitt Merle MD 07/31/2014 1:30 PM

## 2014-07-31 NOTE — ED Notes (Signed)
Pt decided not to wait.  Has md appt at oncology and decided to go there instead.

## 2014-07-31 NOTE — Telephone Encounter (Signed)
Patient called reporting he's in ED waiting for several hours and will be here when he finishes in ED.  Informed him Dr. G=Feng is only here for half day tioday and he will need to reschedule if unable to keep appointment.  Went to ED with "trouble breathing and leg swelling.  I told her about my legs.  It went awaybut came back."  Placed on hold to notify provider and call lost.  Will try to reach him and note appointments that he is running late.

## 2014-07-31 NOTE — Telephone Encounter (Signed)
gave and printed appt sched and avs fo rpt for May °

## 2014-07-31 NOTE — ED Notes (Signed)
Pt seen recently and dx with bronchitis.  Pt has appt today at cancer center.  States he has had increase shortness of breath and leg swelling.  No hx of CHF

## 2014-07-31 NOTE — ED Notes (Signed)
Pt states he is leaving to go to his appt at cancer center

## 2014-08-07 ENCOUNTER — Emergency Department (HOSPITAL_COMMUNITY)
Admission: EM | Admit: 2014-08-07 | Discharge: 2014-08-08 | Disposition: A | Discharge status: Home / Self Care | Payer: Medicare Other | Attending: Emergency Medicine | Admitting: Emergency Medicine

## 2014-08-07 ENCOUNTER — Emergency Department (HOSPITAL_COMMUNITY): Payer: Medicare Other

## 2014-08-07 ENCOUNTER — Encounter (HOSPITAL_COMMUNITY): Payer: Self-pay | Admitting: Emergency Medicine

## 2014-08-07 DIAGNOSIS — R0789 Other chest pain: Secondary | ICD-10-CM | POA: Diagnosis not present

## 2014-08-07 DIAGNOSIS — R0602 Shortness of breath: Secondary | ICD-10-CM | POA: Diagnosis not present

## 2014-08-07 DIAGNOSIS — Z87891 Personal history of nicotine dependence: Secondary | ICD-10-CM | POA: Insufficient documentation

## 2014-08-07 DIAGNOSIS — R000 Tachycardia, unspecified: Secondary | ICD-10-CM | POA: Diagnosis not present

## 2014-08-07 DIAGNOSIS — Z7902 Long term (current) use of antithrombotics/antiplatelets: Secondary | ICD-10-CM | POA: Insufficient documentation

## 2014-08-07 DIAGNOSIS — I2510 Atherosclerotic heart disease of native coronary artery without angina pectoris: Secondary | ICD-10-CM | POA: Insufficient documentation

## 2014-08-07 DIAGNOSIS — Z792 Long term (current) use of antibiotics: Secondary | ICD-10-CM | POA: Insufficient documentation

## 2014-08-07 DIAGNOSIS — R079 Chest pain, unspecified: Secondary | ICD-10-CM | POA: Insufficient documentation

## 2014-08-07 DIAGNOSIS — Z856 Personal history of leukemia: Secondary | ICD-10-CM | POA: Diagnosis not present

## 2014-08-07 DIAGNOSIS — I10 Essential (primary) hypertension: Secondary | ICD-10-CM | POA: Insufficient documentation

## 2014-08-07 DIAGNOSIS — R05 Cough: Secondary | ICD-10-CM | POA: Diagnosis not present

## 2014-08-07 DIAGNOSIS — M7989 Other specified soft tissue disorders: Secondary | ICD-10-CM | POA: Diagnosis not present

## 2014-08-07 DIAGNOSIS — D473 Essential (hemorrhagic) thrombocythemia: Secondary | ICD-10-CM | POA: Diagnosis not present

## 2014-08-07 DIAGNOSIS — Z8673 Personal history of transient ischemic attack (TIA), and cerebral infarction without residual deficits: Secondary | ICD-10-CM | POA: Diagnosis not present

## 2014-08-07 DIAGNOSIS — J40 Bronchitis, not specified as acute or chronic: Secondary | ICD-10-CM | POA: Diagnosis not present

## 2014-08-07 DIAGNOSIS — J45909 Unspecified asthma, uncomplicated: Secondary | ICD-10-CM | POA: Diagnosis not present

## 2014-08-07 DIAGNOSIS — R062 Wheezing: Secondary | ICD-10-CM | POA: Diagnosis not present

## 2014-08-07 DIAGNOSIS — E780 Pure hypercholesterolemia: Secondary | ICD-10-CM | POA: Insufficient documentation

## 2014-08-07 DIAGNOSIS — Z79899 Other long term (current) drug therapy: Secondary | ICD-10-CM | POA: Insufficient documentation

## 2014-08-07 DIAGNOSIS — F329 Major depressive disorder, single episode, unspecified: Secondary | ICD-10-CM | POA: Diagnosis not present

## 2014-08-07 DIAGNOSIS — I517 Cardiomegaly: Secondary | ICD-10-CM | POA: Diagnosis not present

## 2014-08-07 LAB — I-STAT TROPONIN, ED: TROPONIN I, POC: 0.04 ng/mL (ref 0.00–0.08)

## 2014-08-07 LAB — CBC
HEMATOCRIT: 33.6 % — AB (ref 39.0–52.0)
Hemoglobin: 10.6 g/dL — ABNORMAL LOW (ref 13.0–17.0)
MCH: 29.2 pg (ref 26.0–34.0)
MCHC: 31.5 g/dL (ref 30.0–36.0)
MCV: 92.6 fL (ref 78.0–100.0)
Platelets: 1048 10*3/uL (ref 150–400)
RBC: 3.63 MIL/uL — ABNORMAL LOW (ref 4.22–5.81)
RDW: 16.8 % — AB (ref 11.5–15.5)
WBC: 28.8 10*3/uL — ABNORMAL HIGH (ref 4.0–10.5)

## 2014-08-07 LAB — BASIC METABOLIC PANEL
Anion gap: 9 (ref 5–15)
BUN: 13 mg/dL (ref 6–23)
CO2: 24 mmol/L (ref 19–32)
CREATININE: 1.27 mg/dL (ref 0.50–1.35)
Calcium: 9.1 mg/dL (ref 8.4–10.5)
Chloride: 106 mmol/L (ref 96–112)
GFR calc non Af Amer: 56 mL/min — ABNORMAL LOW (ref >90)
GFR, EST AFRICAN AMERICAN: 65 mL/min — AB (ref >90)
Glucose, Bld: 141 mg/dL — ABNORMAL HIGH (ref 70–99)
Potassium: 4 mmol/L (ref 3.5–5.1)
Sodium: 139 mmol/L (ref 135–145)

## 2014-08-07 MED ORDER — SODIUM CHLORIDE 0.9 % IV BOLUS (SEPSIS)
1000.0000 mL | Freq: Once | INTRAVENOUS | Status: AC
  Administered 2014-08-07: 1000 mL via INTRAVENOUS

## 2014-08-07 MED ORDER — IPRATROPIUM-ALBUTEROL 0.5-2.5 (3) MG/3ML IN SOLN
3.0000 mL | RESPIRATORY_TRACT | Status: DC
  Administered 2014-08-07: 3 mL via RESPIRATORY_TRACT
  Filled 2014-08-07: qty 3

## 2014-08-07 MED ORDER — ENOXAPARIN SODIUM 100 MG/ML ~~LOC~~ SOLN: 1.0000 mg/kg | Freq: Once | SUBCUTANEOUS | Status: DC

## 2014-08-07 NOTE — ED Notes (Signed)
C/o bilateral lower extremity swelling x 2 days (R>L).  Also reports non-productive cough since 4/11.  States he was seen here for same and taking meds without relief.  Pt states he is depressed due to recent deaths in family but denies suicidal ideation.

## 2014-08-07 NOTE — ED Provider Notes (Signed)
CSN: [id number]     Arrival date & time 08/07/14  1908 History   First MD Initiated Contact with Patient 08/07/14 2304     This chart was scribed for Everlene Balls, MD by Forrestine Him, ED Scribe. This patient was seen in room A06C/A06C and the patient's care was started 11:19 PM.   Chief Complaint  Patient presents with  . Leg Swelling  . Cough   The history is provided by the patient. No language interpreter was used.    HPI Comments: Bruce [name] is a 69 y.o. male with a PMHx of CAD, HTN, hypercholesterolemia, stroke x 2, chronic myelocytic leukemia, and TIA who presents to the Emergency Department complaining of intermittent, ongoing, unchanged non-productive cough and mild SOB x 2 weeks. Pt also reports bilateral lower extremity swelling; R greater than L x 2 days. Bruce [name] has tried OTC medications with hopes of improvement without any success. No recent chills. Next appointment with Oncologist scheduled 5/2.  No known allergies to medications.  Past Medical History  Diagnosis Date  . Coronary artery disease   . Hypertension   . Hypercholesterolemia   . Stroke   . CML (chronic myelocytic leukemia)   . TIA (transient ischemic attack) 05/10/2014   Past Surgical History  Procedure Laterality Date  . Back surgery    . Hip arthroplasty Right     orif  . Orif forearm fracture Right    Family History  Problem Relation Age of Onset  . Diabetes Mother   . Hypertension Mother   . Diabetes Father   . Hypertension Father   . Diabetes Brother   . Hypertension Brother   . Diabetes Sister   . Hypertension Sister   . Diabetes Brother   . Hypertension Brother   . Diabetes Sister   . Hypertension Sister    History  Substance Use Topics  . Smoking status: Former Smoker -- 0.50 packs/day for 50 years    Types: Cigarettes  . Smokeless tobacco: Never Used  . Alcohol Use: 0.6 oz/week    1 Cans of beer per week     Comment: daily     Review of Systems  HENT: Negative for  congestion.   Respiratory: Positive for cough and shortness of breath.   Cardiovascular: Positive for leg swelling.  Skin: Negative for rash.  Neurological: Negative for weakness.  Psychiatric/Behavioral: Negative for confusion.      Allergies  Review of patient's allergies indicates no known allergies.  Home Medications   Prior to Admission medications   Medication Sig Start Date End Date Taking? Authorizing Provider  ALPRAZolam Duanne Moron) 1 MG tablet Take 1 tablet (1 mg total) by mouth at bedtime as needed for anxiety. 07/31/14  Yes Truitt Merle, MD  amoxicillin (AMOXIL) 500 MG capsule Take 500 mg by mouth 3 (three) times daily. Started 07/23/14, for 10 days ending 08/02/14 07/23/14  Yes Historical Provider, MD  atorvastatin (LIPITOR) 40 MG tablet Take 40 mg by mouth every morning.  03/02/14  Yes Historical Provider, MD  carvedilol (COREG) 3.125 MG tablet Take 3.125 mg by mouth 2 (two) times daily. 05/29/14  Yes Historical Provider, MD  clopidogrel (PLAVIX) 75 MG tablet Take 1 tablet (75 mg total) by mouth daily. 03/21/14  Yes [name], MD  dasatinib (SPRYCEL) 100 MG tablet Take 100 mg by mouth daily.   Yes Historical Provider, MD  furosemide (LASIX) 20 MG tablet Take 1 tablet (20 mg total) by mouth daily. 07/31/14  Yes Truitt Merle,  MD  lisinopril (PRINIVIL,ZESTRIL) 20 MG tablet Take 20 mg by mouth daily. 03/02/14  Yes Historical Provider, MD  oxyCODONE-acetaminophen (PERCOCET/ROXICET) 5-325 MG per tablet  07/23/14  Yes Historical Provider, MD  potassium chloride SA (K-DUR,KLOR-CON) 20 MEQ tablet Take 1 tablet (20 mEq total) by mouth 2 (two) times daily. 07/31/14  Yes Truitt Merle, MD  benzonatate (TESSALON) 100 MG capsule Take 1 capsule (100 mg total) by mouth every 8 (eight) hours. 07/22/14   Noemi Chapel, MD  imatinib (GLEEVEC) 400 MG tablet Take 1 tablet (400 mg total) by mouth daily. Take with meals and large glass of water.Caution:Chemotherapy. Patient taking differently: Take 200 mg by mouth  daily. Take with meals and large glass of water.Caution:Chemotherapy. 03/27/14   Truitt Merle, MD   Triage Vitals: BP 139/92 mmHg  Pulse 104  Temp(Src) 98.3 F (36.8 C)  Resp 16  Ht 5\' 8"  (1.727 m)  Wt 196 lb (88.905 kg)  BMI 29.81 kg/m2  SpO2 98%   Physical Exam  Constitutional: He is oriented to person, place, and time. Vital signs are normal. He appears well-developed and well-nourished.  Non-toxic appearance. He does not appear ill. No distress.  HENT:  Head: Normocephalic and atraumatic.  Nose: Nose normal.  Mouth/Throat: Oropharynx is clear and moist. No oropharyngeal exudate.  Eyes: Conjunctivae and EOM are normal. Pupils are equal, round, and reactive to light. No scleral icterus.  Neck: Normal range of motion. Neck supple. No tracheal deviation, no edema, no erythema and normal range of motion present. No thyroid mass and no thyromegaly present.  Cardiovascular: Normal rate, regular rhythm, S1 normal, S2 normal, normal heart sounds, intact distal pulses and normal pulses.  Exam reveals no gallop and no friction rub.   No murmur heard. Pulses:      Radial pulses are 2+ on the right side, and 2+ on the left side.       Dorsalis pedis pulses are 2+ on the right side, and 2+ on the left side.  Pulmonary/Chest: Effort normal. No respiratory distress. He has wheezes. He has no rhonchi. He has no rales.  Expiratory wheezes noted   Abdominal: Soft. Normal appearance and bowel sounds are normal. He exhibits no distension, no ascites and no mass. There is no hepatosplenomegaly. There is no tenderness. There is no rebound, no guarding and no CVA tenderness.  Musculoskeletal: Normal range of motion. He exhibits edema. He exhibits no tenderness.  2 plus edema of the RLE 1 plus edema on LLE  Lymphadenopathy:    He has no cervical adenopathy.  Neurological: He is alert and oriented to person, place, and time. He has normal strength. No cranial nerve deficit or sensory deficit.  Skin: Skin is  warm, dry and intact. No petechiae and no rash noted. He is not diaphoretic. No erythema. No pallor.  Psychiatric: He has a normal mood and affect. His behavior is normal. Judgment normal.  Nursing note and vitals reviewed.   ED Course  Procedures (including critical care time)  DIAGNOSTIC STUDIES: Oxygen Saturation is 98% on RA, Normal by my interpretation.    COORDINATION OF CARE: 11:19 PM- Will order CBC, BMP, i-stat troponin, CXR, and EKG. Discussed treatment plan with pt at bedside and pt agreed to plan.     Labs Review Labs Reviewed  CBC - Abnormal; Notable for the following:    WBC 28.8 (*)    RBC 3.63 (*)    Hemoglobin 10.6 (*)    HCT 33.6 (*)    RDW  16.8 (*)    Platelets 1048 (*)    All other components within normal limits  BASIC METABOLIC PANEL - Abnormal; Notable for the following:    Glucose, Bld 141 (*)    GFR calc non Af Amer 56 (*)    GFR calc Af Amer 65 (*)    All other components within normal limits  Randolm Idol, ED    Imaging Review Dg Chest 2 View  08/07/2014   CLINICAL DATA:  Shortness of breath and cough for 2 weeks.  EXAM: CHEST  2 VIEW  COMPARISON:  07/31/2014  FINDINGS: Mild cardiomegaly is stable. Decreased perihilar interstitial prominence. No evidence of pulmonary airspace disease or pleural effusion. No definite mass or lymphadenopathy identified.  IMPRESSION: Stable mild cardiomegaly. Decreased central perihilar interstitial prominence, which may be due to decreased interstitial edema or improving bronchitis.   Electronically Signed   By: Earle Gell M.D.   On: 08/07/2014 20:18   Ct Angio Chest Pe W/cm &/or Wo Cm  08/08/2014   CLINICAL DATA:  69 year old male with bilateral lower extremity swelling and nonproductive cough  EXAM: CT ANGIOGRAPHY CHEST WITH CONTRAST  TECHNIQUE: Multidetector CT imaging of the chest was performed using the standard protocol during bolus administration of intravenous contrast. Multiplanar CT image reconstructions  and MIPs were obtained to evaluate the vascular anatomy.  CONTRAST:  163mL OMNIPAQUE IOHEXOL 350 MG/ML SOLN  COMPARISON:  Chest x-ray obtained earlier today  FINDINGS: Mediastinum: Unremarkable CT appearance of the thyroid gland. No suspicious mediastinal or hilar adenopathy. No soft tissue mediastinal mass. The thoracic esophagus is unremarkable.  Heart/Vascular: Adequate opacification of the pulmonary arteries to the proximal subsegmental level. No evidence of central filling defect to suggest acute pulmonary embolus. Pulmonary artery within normal limits for size. The aorta is within normal limits for size. Two vessel aortic arch anatomy. Right brachiocephalic and left common carotid artery share a common origin. Calcifications noted along the coronary arteries. Cardiomegaly. No pericardial effusion.  Lungs/Pleura: Diffuse mild lower lobe bronchial wall thickening. No pulmonary edema, pleural effusion or pneumothorax. No focal airspace consolidation.  Bones/Soft Tissues: No acute fracture or aggressive appearing lytic or blastic osseous lesion.  Upper Abdomen: Visualized upper abdominal organs are unremarkable.  Review of the MIP images confirms the above findings.  IMPRESSION: 1. Negative for acute pulmonary embolus, pneumonia or other acute cardiopulmonary process. 2. Diffuse mild bronchial wall thickening may represent acute or chronic bronchitis. 3. Cardiomegaly. 4. Coronary artery calcification. Please note that although the presence of coronary artery calcium documents the presence of coronary artery disease, the severity of this disease and any potential stenosis cannot be assessed on this non-gated CT examination. Assessment for potential risk factor modification, dietary therapy or pharmacologic therapy may be warranted, if clinically indicated.   Electronically Signed   By: Jacqulynn Cadet M.D.   On: 08/08/2014 01:01     EKG Interpretation   Date/Time:  Wednesday August 07 2014 19:30:23  EDT Ventricular Rate:  101 PR Interval:  148 QRS Duration: 88 QT Interval:  370 QTC Calculation: 479 R Axis:   45 Text Interpretation:  Sinus tachycardia Septal infarct , age undetermined  Abnormal ECG No significant change since last tracing Confirmed by Glynn Octave [phone]) on 08/07/2014 10:56:34 PM      MDM   Final diagnoses:  None    Patient since emergency department for approximately one week of coughing and chest pain. In addition he has bilateral lower extremity swelling which is worse on  the right lower extremity. Patient has a history of leukemia, making blood clots highly likely in this patient. Will obtain CT imaging of the chest to evaluate for blood clot versus pneumonia. His white count is elevated however he has a history of leukemia and states she's compliant with his oral chemotherapy medication. Currently the patient's resting comfortably in the room in no acute distress.  CT angios negative for pulmonary embolism or pneumonia. White blood cell count elevation is likely due to leukemia. Patient does have swelling in his legs and I am still concerned for DVT as he has a history of cancer. He was given Lovenox in the emergency department and instructed to return tomorrow for ultrasound of his legs. Patient's vital signs were within his normal limits, he is in no acute distress and is safe for discharge.  I personally performed the services described in this documentation, which was scribed in my presence. The recorded information has been reviewed and is accurate.   Everlene Balls, MD 08/08/14 [phone]

## 2014-08-08 ENCOUNTER — Emergency Department (HOSPITAL_COMMUNITY)
Admission: EM | Admit: 2014-08-08 | Discharge: 2014-08-09 | Disposition: A | Discharge status: Home / Self Care | Payer: Medicare Other | Attending: Emergency Medicine | Admitting: Emergency Medicine

## 2014-08-08 ENCOUNTER — Encounter (HOSPITAL_COMMUNITY): Payer: Self-pay | Admitting: Radiology

## 2014-08-08 ENCOUNTER — Encounter (HOSPITAL_COMMUNITY): Payer: Self-pay | Admitting: Emergency Medicine

## 2014-08-08 ENCOUNTER — Emergency Department (HOSPITAL_COMMUNITY): Payer: Medicare Other

## 2014-08-08 DIAGNOSIS — Z87891 Personal history of nicotine dependence: Secondary | ICD-10-CM | POA: Insufficient documentation

## 2014-08-08 DIAGNOSIS — Z7902 Long term (current) use of antithrombotics/antiplatelets: Secondary | ICD-10-CM | POA: Diagnosis not present

## 2014-08-08 DIAGNOSIS — I2510 Atherosclerotic heart disease of native coronary artery without angina pectoris: Secondary | ICD-10-CM | POA: Diagnosis not present

## 2014-08-08 DIAGNOSIS — D473 Essential (hemorrhagic) thrombocythemia: Secondary | ICD-10-CM | POA: Insufficient documentation

## 2014-08-08 DIAGNOSIS — R079 Chest pain, unspecified: Secondary | ICD-10-CM | POA: Diagnosis not present

## 2014-08-08 DIAGNOSIS — Z8673 Personal history of transient ischemic attack (TIA), and cerebral infarction without residual deficits: Secondary | ICD-10-CM | POA: Insufficient documentation

## 2014-08-08 DIAGNOSIS — E780 Pure hypercholesterolemia: Secondary | ICD-10-CM | POA: Diagnosis not present

## 2014-08-08 DIAGNOSIS — J40 Bronchitis, not specified as acute or chronic: Secondary | ICD-10-CM | POA: Diagnosis not present

## 2014-08-08 DIAGNOSIS — R000 Tachycardia, unspecified: Secondary | ICD-10-CM | POA: Diagnosis not present

## 2014-08-08 DIAGNOSIS — R05 Cough: Secondary | ICD-10-CM

## 2014-08-08 DIAGNOSIS — Z856 Personal history of leukemia: Secondary | ICD-10-CM | POA: Insufficient documentation

## 2014-08-08 DIAGNOSIS — I10 Essential (primary) hypertension: Secondary | ICD-10-CM | POA: Diagnosis not present

## 2014-08-08 DIAGNOSIS — I517 Cardiomegaly: Secondary | ICD-10-CM | POA: Diagnosis not present

## 2014-08-08 DIAGNOSIS — J45909 Unspecified asthma, uncomplicated: Secondary | ICD-10-CM | POA: Diagnosis not present

## 2014-08-08 DIAGNOSIS — R059 Cough, unspecified: Secondary | ICD-10-CM

## 2014-08-08 DIAGNOSIS — Z79899 Other long term (current) drug therapy: Secondary | ICD-10-CM | POA: Insufficient documentation

## 2014-08-08 DIAGNOSIS — D75839 Thrombocytosis, unspecified: Secondary | ICD-10-CM

## 2014-08-08 LAB — BASIC METABOLIC PANEL
ANION GAP: 10 (ref 5–15)
BUN: 13 mg/dL (ref 6–23)
CHLORIDE: 104 mmol/L (ref 96–112)
CO2: 23 mmol/L (ref 19–32)
Calcium: 8.5 mg/dL (ref 8.4–10.5)
Creatinine, Ser: 1.26 mg/dL (ref 0.50–1.35)
GFR calc Af Amer: 66 mL/min — ABNORMAL LOW (ref >90)
GFR calc non Af Amer: 57 mL/min — ABNORMAL LOW (ref >90)
Glucose, Bld: 247 mg/dL — ABNORMAL HIGH (ref 70–99)
Potassium: 3.8 mmol/L (ref 3.5–5.1)
SODIUM: 137 mmol/L (ref 135–145)

## 2014-08-08 LAB — CBC
HCT: 27.9 % — ABNORMAL LOW (ref 39.0–52.0)
Hemoglobin: 9.1 g/dL — ABNORMAL LOW (ref 13.0–17.0)
MCH: 29.8 pg (ref 26.0–34.0)
MCHC: 32.6 g/dL (ref 30.0–36.0)
MCV: 91.5 fL (ref 78.0–100.0)
PLATELETS: 1020 10*3/uL — AB (ref 150–400)
RBC: 3.05 MIL/uL — ABNORMAL LOW (ref 4.22–5.81)
RDW: 16.8 % — AB (ref 11.5–15.5)
WBC: 24.1 10*3/uL — ABNORMAL HIGH (ref 4.0–10.5)

## 2014-08-08 LAB — PATHOLOGIST SMEAR REVIEW: PATH REVIEW: INCREASED

## 2014-08-08 LAB — I-STAT TROPONIN, ED: Troponin i, poc: 0.03 ng/mL (ref 0.00–0.08)

## 2014-08-08 MED ORDER — ALBUTEROL SULFATE (2.5 MG/3ML) 0.083% IN NEBU
5.0000 mg | INHALATION_SOLUTION | Freq: Once | RESPIRATORY_TRACT | Status: AC
  Administered 2014-08-08: 5 mg via RESPIRATORY_TRACT
  Filled 2014-08-08: qty 6

## 2014-08-08 MED ORDER — PREDNISONE 20 MG PO TABS: 40.0000 mg | ORAL_TABLET | Freq: Every day | ORAL | Status: DC

## 2014-08-08 MED ORDER — BENZONATATE 100 MG PO CAPS: 100.0000 mg | ORAL_CAPSULE | Freq: Three times a day (TID) | ORAL | Status: DC

## 2014-08-08 MED ORDER — ENOXAPARIN SODIUM 100 MG/ML ~~LOC~~ SOLN
1.0000 mg/kg | Freq: Once | SUBCUTANEOUS | Status: AC
  Administered 2014-08-08: 90 mg via SUBCUTANEOUS
  Filled 2014-08-08: qty 1

## 2014-08-08 MED ORDER — ALBUTEROL SULFATE HFA 108 (90 BASE) MCG/ACT IN AERS: 2.0000 | INHALATION_SPRAY | RESPIRATORY_TRACT | Status: DC | PRN

## 2014-08-08 MED ORDER — IOHEXOL 350 MG/ML SOLN
100.0000 mL | Freq: Once | INTRAVENOUS | Status: AC | PRN
  Administered 2014-08-08: 100 mL via INTRAVENOUS

## 2014-08-08 NOTE — ED Notes (Signed)
Pt A&oX4, ambulatory at d/c with steady gait, NAD

## 2014-08-08 NOTE — ED Provider Notes (Signed)
CSN: [id number]     Arrival date & time 08/08/14  1920 History   First MD Initiated Contact with Patient 08/08/14 1926     Chief Complaint  Patient presents with  . Chest Pain     (Consider location/radiation/quality/duration/timing/severity/associated sxs/prior Treatment) HPI Comments: 69 year old male with a history of chronic myelogenous leukemia, history of tobacco use and coronary disease, also has had a stroke in the past and a transient ischemic attack in 2016. He presents with a complaint of increased chest pain which has been present for several weeks, he has had an increased cough which is productive of a thick phlegm and has had some chest pain both at rest and with exertion, the same pain that he has when he coughs. He has had a visit last night for the similar complaints during which time he had a CT scan angiogram of his chest showing no acute vascular cardiac or pulmonary findings other than those consistent with bronchitis. He states that while he was walking today he felt as though his right knee was going to give out, then he had acute chest pain, these have both since resolved and at this time he feels back to baseline other than a heavy cough.  Patient is a 69 y.o. male presenting with chest pain. The history is provided by the patient.  Chest Pain   Past Medical History  Diagnosis Date  . Coronary artery disease   . Hypertension   . Hypercholesterolemia   . Stroke   . CML (chronic myelocytic leukemia)   . TIA (transient ischemic attack) 05/10/2014   Past Surgical History  Procedure Laterality Date  . Back surgery    . Hip arthroplasty Right     orif  . Orif forearm fracture Right    Family History  Problem Relation Age of Onset  . Diabetes Mother   . Hypertension Mother   . Diabetes Father   . Hypertension Father   . Diabetes Brother   . Hypertension Brother   . Diabetes Sister   . Hypertension Sister   . Diabetes Brother   . Hypertension Brother   .  Diabetes Sister   . Hypertension Sister    History  Substance Use Topics  . Smoking status: Former Smoker -- 0.50 packs/day for 50 years    Types: Cigarettes  . Smokeless tobacco: Never Used  . Alcohol Use: 0.6 oz/week    1 Cans of beer per week     Comment: daily     Review of Systems  Cardiovascular: Positive for chest pain.  All other systems reviewed and are negative.     Allergies  Review of patient's allergies indicates no known allergies.  Home Medications   Prior to Admission medications   Medication Sig Start Date End Date Taking? Authorizing Provider  ALPRAZolam Duanne Moron) 1 MG tablet Take 1 tablet (1 mg total) by mouth at bedtime as needed for anxiety. 07/31/14  Yes Truitt Merle, MD  atorvastatin (LIPITOR) 40 MG tablet Take 40 mg by mouth every morning.  03/02/14  Yes Historical Provider, MD  carvedilol (COREG) 3.125 MG tablet Take 3.125 mg by mouth 2 (two) times daily. 05/29/14  Yes Historical Provider, MD  clopidogrel (PLAVIX) 75 MG tablet Take 1 tablet (75 mg total) by mouth daily. 03/21/14  Yes [name], MD  dasatinib (SPRYCEL) 100 MG tablet Take 100 mg by mouth daily.   Yes Historical Provider, MD  furosemide (LASIX) 20 MG tablet Take 1 tablet (20 mg total) by  mouth daily. 07/31/14  Yes Truitt Merle, MD  lisinopril (PRINIVIL,ZESTRIL) 20 MG tablet Take 20 mg by mouth daily. 03/02/14  Yes Historical Provider, MD  potassium chloride SA (K-DUR,KLOR-CON) 20 MEQ tablet Take 1 tablet (20 mEq total) by mouth 2 (two) times daily. 07/31/14  Yes Truitt Merle, MD  albuterol (PROVENTIL HFA;VENTOLIN HFA) 108 (90 BASE) MCG/ACT inhaler Inhale 2 puffs into the lungs every 4 (four) hours as needed for wheezing or shortness of breath. 08/08/14   Noemi Chapel, MD  benzonatate (TESSALON) 100 MG capsule Take 1 capsule (100 mg total) by mouth every 8 (eight) hours. 08/08/14   Noemi Chapel, MD  imatinib (GLEEVEC) 400 MG tablet Take 1 tablet (400 mg total) by mouth daily. Take with meals and large glass  of water.Caution:Chemotherapy. Patient taking differently: Take 200 mg by mouth daily. Take with meals and large glass of water.Caution:Chemotherapy. 03/27/14   Truitt Merle, MD  predniSONE (DELTASONE) 20 MG tablet Take 2 tablets (40 mg total) by mouth daily. 08/08/14   Noemi Chapel, MD   BP 104/87 mmHg  Pulse 85  Temp(Src) 98.5 F (36.9 C) (Oral)  Resp 20  Wt 196 lb (88.905 kg)  SpO2 99% Physical Exam  Constitutional: He appears well-developed and well-nourished. No distress.  HENT:  Head: Normocephalic and atraumatic.  Mouth/Throat: Oropharynx is clear and moist. No oropharyngeal exudate.  Eyes: Conjunctivae and EOM are normal. Pupils are equal, round, and reactive to light. Right eye exhibits no discharge. Left eye exhibits no discharge. No scleral icterus.  Neck: Normal range of motion. Neck supple. No JVD present. No thyromegaly present.  Cardiovascular: Regular rhythm, normal heart sounds and intact distal pulses.  Exam reveals no gallop and no friction rub.   No murmur heard. Mild tachycardia  Pulmonary/Chest: Effort normal. No respiratory distress. He has rales ( rales at the bases that clear with coughing).  Abdominal: Soft. Bowel sounds are normal. He exhibits no distension and no mass. There is no tenderness.  Musculoskeletal: Normal range of motion. He exhibits edema ( slight asymetry R>L). He exhibits no tenderness.  Lymphadenopathy:    He has no cervical adenopathy.  Neurological: He is alert. Coordination normal.  Skin: Skin is warm and dry. No rash noted. No erythema.  Psychiatric: He has a normal mood and affect. His behavior is normal.  Nursing note and vitals reviewed.   ED Course  Procedures (including critical care time) Labs Review Labs Reviewed  CBC - Abnormal; Notable for the following:    WBC 24.1 (*)    RBC 3.05 (*)    Hemoglobin 9.1 (*)    HCT 27.9 (*)    RDW 16.8 (*)    Platelets 1020 (*)    All other components within normal limits  BASIC METABOLIC  PANEL - Abnormal; Notable for the following:    Glucose, Bld 247 (*)    GFR calc non Af Amer 57 (*)    GFR calc Af Amer 66 (*)    All other components within normal limits  I-STAT TROPOININ, ED    Imaging Review Dg Chest 2 View  08/08/2014   CLINICAL DATA:  2-3 week history of cough, chest congestion, crampy chest pain and dizziness. Current history of hypertension. Former smoker. Current history of CML.  EXAM: CHEST  2 VIEW  COMPARISON:  CTA chest earlier same date 0039 hr. Chest x-rays yesterday and earlier.  FINDINGS: Cardiac silhouette mildly enlarged, unchanged. Hilar and mediastinal contours otherwise unremarkable. Mild central peribronchial thickening, unchanged since yesterday,  improved since 07/31/2014. Lungs otherwise clear. No localized airspace consolidation. No pleural effusions. No pneumothorax. Normal pulmonary vascularity. Degenerative changes involving the thoracic spine.  IMPRESSION: Stable mild cardiomegaly. Mild changes of bronchitis and/or asthma, unchanged since yesterday but improved since 8 days ago. No new/acute cardiopulmonary disease.   Electronically Signed   By: Evangeline Dakin M.D.   On: 08/08/2014 20:45   Dg Chest 2 View  08/07/2014   CLINICAL DATA:  Shortness of breath and cough for 2 weeks.  EXAM: CHEST  2 VIEW  COMPARISON:  07/31/2014  FINDINGS: Mild cardiomegaly is stable. Decreased perihilar interstitial prominence. No evidence of pulmonary airspace disease or pleural effusion. No definite mass or lymphadenopathy identified.  IMPRESSION: Stable mild cardiomegaly. Decreased central perihilar interstitial prominence, which may be due to decreased interstitial edema or improving bronchitis.   Electronically Signed   By: Earle Gell M.D.   On: 08/07/2014 20:18   Ct Angio Chest Pe W/cm &/or Wo Cm  08/08/2014   CLINICAL DATA:  68 year old male with bilateral lower extremity swelling and nonproductive cough  EXAM: CT ANGIOGRAPHY CHEST WITH CONTRAST  TECHNIQUE:  Multidetector CT imaging of the chest was performed using the standard protocol during bolus administration of intravenous contrast. Multiplanar CT image reconstructions and MIPs were obtained to evaluate the vascular anatomy.  CONTRAST:  144mL OMNIPAQUE IOHEXOL 350 MG/ML SOLN  COMPARISON:  Chest x-ray obtained earlier today  FINDINGS: Mediastinum: Unremarkable CT appearance of the thyroid gland. No suspicious mediastinal or hilar adenopathy. No soft tissue mediastinal mass. The thoracic esophagus is unremarkable.  Heart/Vascular: Adequate opacification of the pulmonary arteries to the proximal subsegmental level. No evidence of central filling defect to suggest acute pulmonary embolus. Pulmonary artery within normal limits for size. The aorta is within normal limits for size. Two vessel aortic arch anatomy. Right brachiocephalic and left common carotid artery share a common origin. Calcifications noted along the coronary arteries. Cardiomegaly. No pericardial effusion.  Lungs/Pleura: Diffuse mild lower lobe bronchial wall thickening. No pulmonary edema, pleural effusion or pneumothorax. No focal airspace consolidation.  Bones/Soft Tissues: No acute fracture or aggressive appearing lytic or blastic osseous lesion.  Upper Abdomen: Visualized upper abdominal organs are unremarkable.  Review of the MIP images confirms the above findings.  IMPRESSION: 1. Negative for acute pulmonary embolus, pneumonia or other acute cardiopulmonary process. 2. Diffuse mild bronchial wall thickening may represent acute or chronic bronchitis. 3. Cardiomegaly. 4. Coronary artery calcification. Please note that although the presence of coronary artery calcium documents the presence of coronary artery disease, the severity of this disease and any potential stenosis cannot be assessed on this non-gated CT examination. Assessment for potential risk factor modification, dietary therapy or pharmacologic therapy may be warranted, if clinically  indicated.   Electronically Signed   By: Jacqulynn Cadet M.D.   On: 08/08/2014 01:01      MDM   Final diagnoses:  Cough  Chest pain, unspecified chest pain type  Bronchitis  Thrombocytosis    The patient has slight rales which could be consistent with a developing pneumonia, the CT scan last night was very reassuring that we are not date dealing with a severely pathologic process, he has chronic cough, chronic chest pain, check EKG and labs though I suspect that we are dealing with bronchitis, albuterol ordered.  ED ECG REPORT  I personally interpreted this EKG   Date: 08/08/2014   Rate: 93  Rhythm: normal sinus rhythm  QRS Axis: normal  Intervals: normal  ST/T Wave abnormalities:  unchanged T wave inversions in multiple leads - inferior and lateral precordia c/w prior ECG's  Conduction Disutrbances:none  Narrative Interpretation:   Old EKG Reviewed: unchanged  The patient has maintained a normal blood pressure, pulse, temperature and oxygen saturation on room air. He has a chest x-ray showing no interval development of pneumonia or pneumothorax and his lab work shows no significant findings includinga normal troponin. He does however have a significant leukocytosis, anemia and thrombocytosis however these are chronic findings for his myelogenous disease. The patient has been informed of his findings, he appears stable for discharge, he will be sent home with the following medications. He is able to express the indications for return.  Meds given in ED:  Medications  albuterol (PROVENTIL) (2.5 MG/3ML) 0.083% nebulizer solution 5 mg (5 mg Nebulization Given 08/08/14 1952)    New Prescriptions   ALBUTEROL (PROVENTIL HFA;VENTOLIN HFA) 108 (90 BASE) MCG/ACT INHALER    Inhale 2 puffs into the lungs every 4 (four) hours as needed for wheezing or shortness of breath.   BENZONATATE (TESSALON) 100 MG CAPSULE    Take 1 capsule (100 mg total) by mouth every 8 (eight) hours.   PREDNISONE  (DELTASONE) 20 MG TABLET    Take 2 tablets (40 mg total) by mouth daily.      Noemi Chapel, MD 08/08/14 [phone]

## 2014-08-08 NOTE — ED Notes (Signed)
Pt arrives with c/o cp and cough, states he's had a productive cough for about three weeks, chest pain ongoing for about a week. Improved with one nitro from EMS, 324 MG aspirin and 5MG  albuterol. Dxed with bronchitis recently. EKG unremarkable, States he was scheduled for doppler study and didn't make it. Pt seen in our department yesterday.

## 2014-08-08 NOTE — Progress Notes (Signed)
HHN given by nursing staff

## 2014-08-08 NOTE — Discharge Instructions (Signed)
Peripheral Edema Bruce [name], you were given a blood thinner for possible blood clot in your legs. Back to [facility] emergency department tomorrow morning at 8 AM for an ultrasound of her legs. If any symptoms worsen before then come back to the emergency department immediately. Thank you. You have swelling in your legs (peripheral edema). This swelling is due to excess accumulation of salt and water in your body. Edema may be a sign of heart, kidney or liver disease, or a side effect of a medication. It may also be due to problems in the leg veins. Elevating your legs and using special support stockings may be very helpful, if the cause of the swelling is due to poor venous circulation. Avoid long periods of standing, whatever the cause. Treatment of edema depends on identifying the cause. Chips, pretzels, pickles and other salty foods should be avoided. Restricting salt in your diet is almost always needed. Water pills (diuretics) are often used to remove the excess salt and water from your body via urine. These medicines prevent the kidney from reabsorbing sodium. This increases urine flow. Diuretic treatment may also result in lowering of potassium levels in your body. Potassium supplements may be needed if you have to use diuretics daily. Daily weights can help you keep track of your progress in clearing your edema. You should call your caregiver for follow up care as recommended. SEEK IMMEDIATE MEDICAL CARE IF:   You have increased swelling, pain, redness, or heat in your legs.  You develop shortness of breath, especially when lying down.  You develop chest or abdominal pain, weakness, or fainting.  You have a fever. Document Released: 05/06/2004 Document Revised: 06/21/2011 Document Reviewed: 04/16/2009 [facility] Patient Information 2015 [place], [place]. This information is not intended to replace advice given to you by your health care provider. Make sure you discuss any questions you have  with your health care provider.

## 2014-08-08 NOTE — Discharge Instructions (Signed)
Your xray shows no pneumonia.  You blood work shows that you have elevated platelets - let your blood / cancer doctors know about this and follow up this week.  Albuterol - 2 puffs every 4 hours as needed for difficulty breathing Tessalon for cough Prednisone once a day for cough / shortness of breath.  Tylenol for pain.  Please call your doctor for a followup appointment within 24-48 hours. When you talk to your doctor please let them know that you were seen in the emergency department and have them acquire all of your records so that they can discuss the findings with you and formulate a treatment plan to fully care for your new and ongoing problems.

## 2014-08-12 ENCOUNTER — Ambulatory Visit: Payer: Medicare Other | Admitting: Hematology

## 2014-08-12 ENCOUNTER — Other Ambulatory Visit: Payer: Medicare Other

## 2014-08-21 DIAGNOSIS — E785 Hyperlipidemia, unspecified: Secondary | ICD-10-CM | POA: Diagnosis not present

## 2014-08-21 DIAGNOSIS — I10 Essential (primary) hypertension: Secondary | ICD-10-CM | POA: Diagnosis not present

## 2014-08-21 DIAGNOSIS — I5020 Unspecified systolic (congestive) heart failure: Secondary | ICD-10-CM | POA: Diagnosis not present

## 2014-08-21 DIAGNOSIS — G894 Chronic pain syndrome: Secondary | ICD-10-CM | POA: Diagnosis not present

## 2014-08-21 DIAGNOSIS — I2510 Atherosclerotic heart disease of native coronary artery without angina pectoris: Secondary | ICD-10-CM | POA: Diagnosis not present

## 2014-09-13 ENCOUNTER — Inpatient Hospital Stay (HOSPITAL_COMMUNITY): Payer: Medicare Other

## 2014-09-13 ENCOUNTER — Encounter (HOSPITAL_COMMUNITY): Payer: Self-pay | Admitting: Emergency Medicine

## 2014-09-13 ENCOUNTER — Inpatient Hospital Stay (HOSPITAL_COMMUNITY)
Admission: EM | Admit: 2014-09-13 | Discharge: 2014-09-22 | DRG: 280 | Disposition: A | Discharge status: Skilled Nursing Facility | Payer: Medicare Other | Attending: Internal Medicine | Admitting: Internal Medicine

## 2014-09-13 ENCOUNTER — Emergency Department (HOSPITAL_COMMUNITY): Payer: Medicare Other

## 2014-09-13 DIAGNOSIS — R570 Cardiogenic shock: Secondary | ICD-10-CM | POA: Diagnosis not present

## 2014-09-13 DIAGNOSIS — I5020 Unspecified systolic (congestive) heart failure: Secondary | ICD-10-CM | POA: Diagnosis not present

## 2014-09-13 DIAGNOSIS — R109 Unspecified abdominal pain: Secondary | ICD-10-CM

## 2014-09-13 DIAGNOSIS — I429 Cardiomyopathy, unspecified: Secondary | ICD-10-CM | POA: Diagnosis not present

## 2014-09-13 DIAGNOSIS — K828 Other specified diseases of gallbladder: Secondary | ICD-10-CM | POA: Diagnosis not present

## 2014-09-13 DIAGNOSIS — E872 Acidosis, unspecified: Secondary | ICD-10-CM

## 2014-09-13 DIAGNOSIS — K5900 Constipation, unspecified: Secondary | ICD-10-CM | POA: Diagnosis present

## 2014-09-13 DIAGNOSIS — R011 Cardiac murmur, unspecified: Secondary | ICD-10-CM | POA: Diagnosis present

## 2014-09-13 DIAGNOSIS — R778 Other specified abnormalities of plasma proteins: Secondary | ICD-10-CM | POA: Diagnosis present

## 2014-09-13 DIAGNOSIS — Z7952 Long term (current) use of systemic steroids: Secondary | ICD-10-CM

## 2014-09-13 DIAGNOSIS — J811 Chronic pulmonary edema: Secondary | ICD-10-CM

## 2014-09-13 DIAGNOSIS — F1010 Alcohol abuse, uncomplicated: Secondary | ICD-10-CM | POA: Diagnosis present

## 2014-09-13 DIAGNOSIS — Z8673 Personal history of transient ischemic attack (TIA), and cerebral infarction without residual deficits: Secondary | ICD-10-CM | POA: Diagnosis not present

## 2014-09-13 DIAGNOSIS — I2510 Atherosclerotic heart disease of native coronary artery without angina pectoris: Secondary | ICD-10-CM | POA: Diagnosis present

## 2014-09-13 DIAGNOSIS — I10 Essential (primary) hypertension: Secondary | ICD-10-CM | POA: Diagnosis not present

## 2014-09-13 DIAGNOSIS — I248 Other forms of acute ischemic heart disease: Secondary | ICD-10-CM | POA: Diagnosis present

## 2014-09-13 DIAGNOSIS — H5442 Blindness, left eye, normal vision right eye: Secondary | ICD-10-CM | POA: Diagnosis present

## 2014-09-13 DIAGNOSIS — I25110 Atherosclerotic heart disease of native coronary artery with unstable angina pectoris: Secondary | ICD-10-CM | POA: Diagnosis not present

## 2014-09-13 DIAGNOSIS — I5023 Acute on chronic systolic (congestive) heart failure: Secondary | ICD-10-CM | POA: Diagnosis not present

## 2014-09-13 DIAGNOSIS — R8299 Other abnormal findings in urine: Secondary | ICD-10-CM | POA: Diagnosis not present

## 2014-09-13 DIAGNOSIS — R7989 Other specified abnormal findings of blood chemistry: Secondary | ICD-10-CM | POA: Diagnosis not present

## 2014-09-13 DIAGNOSIS — D638 Anemia in other chronic diseases classified elsewhere: Secondary | ICD-10-CM | POA: Diagnosis present

## 2014-09-13 DIAGNOSIS — K7290 Hepatic failure, unspecified without coma: Secondary | ICD-10-CM | POA: Diagnosis not present

## 2014-09-13 DIAGNOSIS — C9210 Chronic myeloid leukemia, BCR/ABL-positive, not having achieved remission: Secondary | ICD-10-CM | POA: Diagnosis not present

## 2014-09-13 DIAGNOSIS — Z87891 Personal history of nicotine dependence: Secondary | ICD-10-CM | POA: Diagnosis not present

## 2014-09-13 DIAGNOSIS — E780 Pure hypercholesterolemia: Secondary | ICD-10-CM | POA: Diagnosis present

## 2014-09-13 DIAGNOSIS — Z7902 Long term (current) use of antithrombotics/antiplatelets: Secondary | ICD-10-CM | POA: Diagnosis not present

## 2014-09-13 DIAGNOSIS — A4189 Other specified sepsis: Secondary | ICD-10-CM | POA: Diagnosis not present

## 2014-09-13 DIAGNOSIS — Z8249 Family history of ischemic heart disease and other diseases of the circulatory system: Secondary | ICD-10-CM

## 2014-09-13 DIAGNOSIS — IMO0001 Reserved for inherently not codable concepts without codable children: Secondary | ICD-10-CM | POA: Insufficient documentation

## 2014-09-13 DIAGNOSIS — E876 Hypokalemia: Secondary | ICD-10-CM | POA: Diagnosis present

## 2014-09-13 DIAGNOSIS — E785 Hyperlipidemia, unspecified: Secondary | ICD-10-CM | POA: Diagnosis not present

## 2014-09-13 DIAGNOSIS — I214 Non-ST elevation (NSTEMI) myocardial infarction: Principal | ICD-10-CM | POA: Diagnosis present

## 2014-09-13 DIAGNOSIS — R740 Nonspecific elevation of levels of transaminase and lactic acid dehydrogenase [LDH]: Secondary | ICD-10-CM | POA: Diagnosis not present

## 2014-09-13 DIAGNOSIS — Z833 Family history of diabetes mellitus: Secondary | ICD-10-CM

## 2014-09-13 DIAGNOSIS — C9290 Myeloid leukemia, unspecified, not having achieved remission: Secondary | ICD-10-CM

## 2014-09-13 DIAGNOSIS — G459 Transient cerebral ischemic attack, unspecified: Secondary | ICD-10-CM

## 2014-09-13 DIAGNOSIS — G9340 Encephalopathy, unspecified: Secondary | ICD-10-CM | POA: Diagnosis not present

## 2014-09-13 DIAGNOSIS — R0602 Shortness of breath: Secondary | ICD-10-CM

## 2014-09-13 DIAGNOSIS — D899 Disorder involving the immune mechanism, unspecified: Secondary | ICD-10-CM | POA: Diagnosis not present

## 2014-09-13 DIAGNOSIS — R000 Tachycardia, unspecified: Secondary | ICD-10-CM | POA: Diagnosis not present

## 2014-09-13 DIAGNOSIS — Z79891 Long term (current) use of opiate analgesic: Secondary | ICD-10-CM

## 2014-09-13 DIAGNOSIS — R739 Hyperglycemia, unspecified: Secondary | ICD-10-CM | POA: Diagnosis not present

## 2014-09-13 DIAGNOSIS — Z79899 Other long term (current) drug therapy: Secondary | ICD-10-CM

## 2014-09-13 DIAGNOSIS — I428 Other cardiomyopathies: Secondary | ICD-10-CM | POA: Diagnosis present

## 2014-09-13 DIAGNOSIS — A419 Sepsis, unspecified organism: Secondary | ICD-10-CM | POA: Diagnosis present

## 2014-09-13 DIAGNOSIS — R05 Cough: Secondary | ICD-10-CM | POA: Diagnosis not present

## 2014-09-13 DIAGNOSIS — R1011 Right upper quadrant pain: Secondary | ICD-10-CM | POA: Diagnosis not present

## 2014-09-13 DIAGNOSIS — N289 Disorder of kidney and ureter, unspecified: Secondary | ICD-10-CM

## 2014-09-13 DIAGNOSIS — R29810 Facial weakness: Secondary | ICD-10-CM | POA: Diagnosis present

## 2014-09-13 DIAGNOSIS — I700 Atherosclerosis of aorta: Secondary | ICD-10-CM | POA: Diagnosis not present

## 2014-09-13 DIAGNOSIS — N179 Acute kidney failure, unspecified: Secondary | ICD-10-CM | POA: Diagnosis not present

## 2014-09-13 DIAGNOSIS — R6520 Severe sepsis without septic shock: Secondary | ICD-10-CM | POA: Diagnosis not present

## 2014-09-13 DIAGNOSIS — K7200 Acute and subacute hepatic failure without coma: Secondary | ICD-10-CM | POA: Diagnosis present

## 2014-09-13 DIAGNOSIS — Z96649 Presence of unspecified artificial hip joint: Secondary | ICD-10-CM | POA: Diagnosis present

## 2014-09-13 DIAGNOSIS — I6930 Unspecified sequelae of cerebral infarction: Secondary | ICD-10-CM

## 2014-09-13 DIAGNOSIS — D689 Coagulation defect, unspecified: Secondary | ICD-10-CM | POA: Diagnosis not present

## 2014-09-13 DIAGNOSIS — J9600 Acute respiratory failure, unspecified whether with hypoxia or hypercapnia: Secondary | ICD-10-CM | POA: Diagnosis not present

## 2014-09-13 DIAGNOSIS — E860 Dehydration: Secondary | ICD-10-CM | POA: Diagnosis present

## 2014-09-13 DIAGNOSIS — R6510 Systemic inflammatory response syndrome (SIRS) of non-infectious origin without acute organ dysfunction: Secondary | ICD-10-CM

## 2014-09-13 DIAGNOSIS — R1084 Generalized abdominal pain: Secondary | ICD-10-CM | POA: Diagnosis not present

## 2014-09-13 DIAGNOSIS — E869 Volume depletion, unspecified: Secondary | ICD-10-CM | POA: Diagnosis not present

## 2014-09-13 DIAGNOSIS — I517 Cardiomegaly: Secondary | ICD-10-CM | POA: Diagnosis not present

## 2014-09-13 DIAGNOSIS — R0789 Other chest pain: Secondary | ICD-10-CM | POA: Diagnosis not present

## 2014-09-13 DIAGNOSIS — R1010 Upper abdominal pain, unspecified: Secondary | ICD-10-CM | POA: Diagnosis not present

## 2014-09-13 DIAGNOSIS — R7401 Elevation of levels of liver transaminase levels: Secondary | ICD-10-CM | POA: Diagnosis present

## 2014-09-13 DIAGNOSIS — C9310 Chronic myelomonocytic leukemia not having achieved remission: Secondary | ICD-10-CM | POA: Diagnosis not present

## 2014-09-13 DIAGNOSIS — D649 Anemia, unspecified: Secondary | ICD-10-CM | POA: Diagnosis not present

## 2014-09-13 DIAGNOSIS — R079 Chest pain, unspecified: Secondary | ICD-10-CM | POA: Diagnosis not present

## 2014-09-13 HISTORY — DX: Bell's palsy: G51.0

## 2014-09-13 LAB — BASIC METABOLIC PANEL
ANION GAP: 24 — AB (ref 5–15)
BUN: 49 mg/dL — ABNORMAL HIGH (ref 6–20)
CALCIUM: 9 mg/dL (ref 8.9–10.3)
CO2: 15 mmol/L — ABNORMAL LOW (ref 22–32)
CREATININE: 2.84 mg/dL — AB (ref 0.61–1.24)
Chloride: 99 mmol/L — ABNORMAL LOW (ref 101–111)
GFR calc Af Amer: 25 mL/min — ABNORMAL LOW (ref >60)
GFR calc non Af Amer: 21 mL/min — ABNORMAL LOW (ref >60)
GLUCOSE: 138 mg/dL — AB (ref 65–99)
Potassium: 4.9 mmol/L (ref 3.5–5.1)
SODIUM: 138 mmol/L (ref 135–145)

## 2014-09-13 LAB — AMYLASE: Amylase: 59 U/L (ref 28–100)

## 2014-09-13 LAB — MRSA PCR SCREENING: MRSA by PCR: NEGATIVE

## 2014-09-13 LAB — DIFFERENTIAL
Basophils Absolute: 0 10*3/uL (ref 0.0–0.1)
Basophils Relative: 0 % (ref 0–1)
EOS PCT: 0 % (ref 0–5)
Eosinophils Absolute: 0 10*3/uL (ref 0.0–0.7)
Lymphocytes Relative: 16 % (ref 12–46)
Lymphs Abs: 1 10*3/uL (ref 0.7–4.0)
MONOS PCT: 15 % — AB (ref 3–12)
Monocytes Absolute: 0.9 10*3/uL (ref 0.1–1.0)
NEUTROS ABS: 4.3 10*3/uL (ref 1.7–7.7)
NEUTROS PCT: 69 % (ref 43–77)

## 2014-09-13 LAB — URINALYSIS, ROUTINE W REFLEX MICROSCOPIC
Glucose, UA: NEGATIVE mg/dL
HGB URINE DIPSTICK: NEGATIVE
KETONES UR: NEGATIVE mg/dL
NITRITE: NEGATIVE
Protein, ur: 100 mg/dL — AB
SPECIFIC GRAVITY, URINE: 1.026 (ref 1.005–1.030)
Urobilinogen, UA: 2 mg/dL — ABNORMAL HIGH (ref 0.0–1.0)
pH: 5 (ref 5.0–8.0)

## 2014-09-13 LAB — HEPATIC FUNCTION PANEL
ALT: 455 U/L — ABNORMAL HIGH (ref 17–63)
ALT: 636 U/L — AB (ref 17–63)
AST: 542 U/L — ABNORMAL HIGH (ref 15–41)
AST: 852 U/L — AB (ref 15–41)
Albumin: 4.3 g/dL (ref 3.5–5.0)
Albumin: 3.7 g/dL (ref 3.5–5.0)
Alkaline Phosphatase: 300 U/L — ABNORMAL HIGH (ref 38–126)
Alkaline Phosphatase: 271 U/L — ABNORMAL HIGH (ref 38–126)
BILIRUBIN DIRECT: 2.7 mg/dL — AB (ref 0.1–0.5)
BILIRUBIN INDIRECT: 2.1 mg/dL — AB (ref 0.3–0.9)
BILIRUBIN INDIRECT: 2.1 mg/dL — AB (ref 0.3–0.9)
BILIRUBIN TOTAL: 4.8 mg/dL — AB (ref 0.3–1.2)
Bilirubin, Direct: 2.2 mg/dL — ABNORMAL HIGH (ref 0.1–0.5)
TOTAL PROTEIN: 7.9 g/dL (ref 6.5–8.1)
Total Bilirubin: 4.3 mg/dL — ABNORMAL HIGH (ref 0.3–1.2)
Total Protein: 6.9 g/dL (ref 6.5–8.1)

## 2014-09-13 LAB — I-STAT CG4 LACTIC ACID, ED
Lactic Acid, Venous: 8.79 mmol/L (ref 0.5–2.0)
Lactic Acid, Venous: 6.82 mmol/L (ref 0.5–2.0)

## 2014-09-13 LAB — LIPASE, BLOOD: Lipase: 24 U/L (ref 22–51)

## 2014-09-13 LAB — MAGNESIUM: MAGNESIUM: 2.6 mg/dL — AB (ref 1.7–2.4)

## 2014-09-13 LAB — CBC
HEMATOCRIT: 38.1 % — AB (ref 39.0–52.0)
Hemoglobin: 12.1 g/dL — ABNORMAL LOW (ref 13.0–17.0)
MCH: 28.4 pg (ref 26.0–34.0)
MCHC: 31.8 g/dL (ref 30.0–36.0)
MCV: 89.4 fL (ref 78.0–100.0)
Platelets: 529 10*3/uL — ABNORMAL HIGH (ref 150–400)
RBC: 4.26 MIL/uL (ref 4.22–5.81)
RDW: 16.4 % — ABNORMAL HIGH (ref 11.5–15.5)
WBC: 6.5 10*3/uL (ref 4.0–10.5)

## 2014-09-13 LAB — TROPONIN I
Troponin I: 0.48 ng/mL — ABNORMAL HIGH (ref <0.031)
Troponin I: 0.48 ng/mL — ABNORMAL HIGH (ref <0.031)

## 2014-09-13 LAB — URINE MICROSCOPIC-ADD ON

## 2014-09-13 LAB — PROCALCITONIN
PROCALCITONIN: 0.56 ng/mL
Procalcitonin: 0.56 ng/mL

## 2014-09-13 LAB — RAPID URINE DRUG SCREEN, HOSP PERFORMED
AMPHETAMINES: NOT DETECTED
Barbiturates: NOT DETECTED
Benzodiazepines: NOT DETECTED
COCAINE: NOT DETECTED
OPIATES: NOT DETECTED
Tetrahydrocannabinol: NOT DETECTED

## 2014-09-13 LAB — PROTIME-INR
INR: 2.35 — ABNORMAL HIGH (ref 0.00–1.49)
PROTHROMBIN TIME: 25.5 s — AB (ref 11.6–15.2)

## 2014-09-13 LAB — CK: CK TOTAL: 165 U/L (ref 49–397)

## 2014-09-13 LAB — SODIUM, URINE, RANDOM: Sodium, Ur: 11 mmol/L

## 2014-09-13 LAB — APTT: APTT: 32 s (ref 24–37)

## 2014-09-13 LAB — URIC ACID: Uric Acid, Serum: 9.7 mg/dL — ABNORMAL HIGH (ref 4.4–7.6)

## 2014-09-13 LAB — CREATININE, URINE, RANDOM: Creatinine, Urine: 325.24 mg/dL

## 2014-09-13 MED ORDER — PIPERACILLIN-TAZOBACTAM 3.375 G IVPB: 3.3750 g | Freq: Three times a day (TID) | INTRAVENOUS | Status: DC

## 2014-09-13 MED ORDER — SODIUM CHLORIDE 0.9 % IV BOLUS (SEPSIS)
2000.0000 mL | Freq: Once | INTRAVENOUS | Status: AC
Start: 2014-09-13 — End: 2014-09-13
  Administered 2014-09-13: 2000 mL via INTRAVENOUS

## 2014-09-13 MED ORDER — PIPERACILLIN-TAZOBACTAM 3.375 G IVPB 30 MIN
3.3750 g | Freq: Once | INTRAVENOUS | Status: AC
  Administered 2014-09-13: 3.375 g via INTRAVENOUS
  Filled 2014-09-13: qty 50

## 2014-09-13 MED ORDER — SODIUM CHLORIDE 0.9 % IV SOLN
INTRAVENOUS | Status: DC
  Administered 2014-09-13 – 2014-09-14 (×2): via INTRAVENOUS
  Administered 2014-09-15: 150 mL/h via INTRAVENOUS
  Administered 2014-09-15: 01:00:00 via INTRAVENOUS

## 2014-09-13 MED ORDER — ALBUTEROL SULFATE (2.5 MG/3ML) 0.083% IN NEBU: 2.5000 mg | INHALATION_SOLUTION | RESPIRATORY_TRACT | Status: DC | PRN

## 2014-09-13 MED ORDER — ASPIRIN 81 MG PO CHEW
81.0000 mg | CHEWABLE_TABLET | Freq: Every day | ORAL | Status: DC
  Administered 2014-09-13 – 2014-09-22 (×9): 81 mg via ORAL
  Filled 2014-09-13 (×9): qty 1

## 2014-09-13 MED ORDER — IOHEXOL 300 MG/ML  SOLN
50.0000 mL | Freq: Once | INTRAMUSCULAR | Status: AC | PRN
  Administered 2014-09-13: 50 mL via ORAL

## 2014-09-13 MED ORDER — DEXTROMETHORPHAN POLISTIREX ER 30 MG/5ML PO SUER
15.0000 mg | Freq: Two times a day (BID) | ORAL | Status: DC
  Administered 2014-09-13 – 2014-09-22 (×17): 15 mg via ORAL
  Filled 2014-09-13 (×22): qty 5

## 2014-09-13 MED ORDER — LORAZEPAM 2 MG/ML IJ SOLN
0.5000 mg | Freq: Once | INTRAMUSCULAR | Status: DC
  Filled 2014-09-13: qty 1

## 2014-09-13 MED ORDER — VANCOMYCIN HCL IN DEXTROSE 1-5 GM/200ML-% IV SOLN
1000.0000 mg | Freq: Once | INTRAVENOUS | Status: AC
  Administered 2014-09-13: 1000 mg via INTRAVENOUS
  Filled 2014-09-13: qty 200

## 2014-09-13 MED ORDER — HEPARIN SODIUM (PORCINE) 5000 UNIT/ML IJ SOLN
5000.0000 [IU] | Freq: Three times a day (TID) | INTRAMUSCULAR | Status: DC
  Administered 2014-09-13 – 2014-09-15 (×5): 5000 [IU] via SUBCUTANEOUS
  Filled 2014-09-13 (×5): qty 1

## 2014-09-13 MED ORDER — SODIUM CHLORIDE 0.9 % IV BOLUS (SEPSIS)
1000.0000 mL | Freq: Once | INTRAVENOUS | Status: AC
  Administered 2014-09-13: 1000 mL via INTRAVENOUS

## 2014-09-13 MED ORDER — SODIUM CHLORIDE 0.9 % IV BOLUS (SEPSIS)
2000.0000 mL | Freq: Once | INTRAVENOUS | Status: AC
  Administered 2014-09-13: 2000 mL via INTRAVENOUS

## 2014-09-13 MED ORDER — VANCOMYCIN HCL IN DEXTROSE 750-5 MG/150ML-% IV SOLN
750.0000 mg | INTRAVENOUS | Status: DC
  Filled 2014-09-13: qty 150

## 2014-09-13 MED ORDER — MORPHINE SULFATE 2 MG/ML IJ SOLN
1.0000 mg | INTRAMUSCULAR | Status: DC | PRN
  Administered 2014-09-13 – 2014-09-21 (×15): 1 mg via INTRAVENOUS
  Filled 2014-09-13 (×15): qty 1

## 2014-09-13 MED ORDER — CLOPIDOGREL BISULFATE 75 MG PO TABS
75.0000 mg | ORAL_TABLET | Freq: Every day | ORAL | Status: DC
  Administered 2014-09-13: 75 mg via ORAL
  Filled 2014-09-13: qty 1

## 2014-09-13 MED ORDER — SODIUM CHLORIDE 0.9 % IJ SOLN
3.0000 mL | Freq: Two times a day (BID) | INTRAMUSCULAR | Status: DC
  Administered 2014-09-13 – 2014-09-22 (×12): 3 mL via INTRAVENOUS

## 2014-09-13 MED ORDER — PROMETHAZINE HCL 25 MG/ML IJ SOLN
12.5000 mg | Freq: Four times a day (QID) | INTRAMUSCULAR | Status: DC | PRN
Start: 2014-09-13 — End: 2014-09-14
  Administered 2014-09-14: 12.5 mg via INTRAVENOUS
  Filled 2014-09-13: qty 1

## 2014-09-13 MED ORDER — PIPERACILLIN-TAZOBACTAM 3.375 G IVPB
3.3750 g | Freq: Three times a day (TID) | INTRAVENOUS | Status: DC
  Administered 2014-09-13 – 2014-09-14 (×2): 3.375 g via INTRAVENOUS
  Filled 2014-09-13 (×2): qty 50

## 2014-09-13 MED ORDER — CARVEDILOL 3.125 MG PO TABS
3.1250 mg | ORAL_TABLET | Freq: Two times a day (BID) | ORAL | Status: DC
  Administered 2014-09-13 – 2014-09-16 (×6): 3.125 mg via ORAL
  Filled 2014-09-13 (×6): qty 1

## 2014-09-13 NOTE — ED Notes (Signed)
Pickering MD at bedside. 

## 2014-09-13 NOTE — H&P (Addendum)
[facility] History and Physical  Bruce [name] QQV:[id number] DOB: Dec 08, 1945 DOA: 09/13/2014  Referring physician: Dr. Davonna Belling PCP: [name], MD   Chief Complaint: Abdominal pain, chest pain and generalized weakness since one day  HPI:  69 year old jehovah's witness male with history of CML (diagnosed in November 2015) on Gleevac switched to desatinib recently due to side effects (? Elevated LFTs/ rectal bleed), coronary artery disease, nonischemic cardiomyopathy with EF of 40%, tobacco and alcohol use, hypertension, hyperlipidemia, history of TIAs (with some residual facial droop and partial left eye blindness) presented to the ED with lower abdominal pain and substernal chest pain since yesterday. Patient is a very poor historian. Patient reports having an episode of vomiting 2 days back. Also reported generalized weakness. Since yesterday, he has  been having crampy lower abdominal pain which radiates to his substernal area. Denies chest pain radiating to his arms. This morning he also felt dizzy and noticed some slurred speech.  Patient denies headache, dizziness, fever, chills, nausea , palpitations, SOB, bowel or urinary symptoms. Denies change in weight but has poor appetite and not drinking enough fluid. Denies any jaundice, tingling or numbness in his arms or legs.  Course in the ED Patient was afebrile but found to be tachycardic and tachypneic. Her pressure and O2 sats were normal. Blood will done showed no basilar 6.5, hemoglobin of 12.1, platelets 529. Sodium of 138, potassium of 4.9, with an end gap of 24. Patient also had acute kidney injury reviewed 49 and creatinine of 2.84. Troponin was elevated to 0.48. LFTs were markedly elevated with AST of 542, ALT of 455, alkaline phosphatase of 300, total bilirubin of 4.3 (direct: 2.2). Lactic acid was markedly elevated to 8.79, which improved to 6.82 after 2 L IV normal saline bolus in the ED. EKG showed sinus  tachycardia at 120 with prolonged QTC of 544 and T-wave inversion in inferior and lateral leads which seems unchanged from prior study. Chest x-ray was unremarkable. Cultures were ordered from the ED and patient received an empiric dose of IV vancomycin and Zosyn. Patient reports his abdominal pain to be better after coming to the ED. Cardiologist  Dr. Terrence Dupont was consulted by ED physician. I also spoke with Dr.Ennever who was covering for patient's hematologist Dr Burr Medico who suggested is that acute transaminitis is not very common with [place] or Dasatanib.  Admitted to hospitalist service on stepdown unit.   Review of Systems:  Constitutional: Denies fever, chills, diaphoresis, poor appetite and fatigue.  HEENT: Denies visual or hearing symptoms, congestion, difficulty swallowing, neck pain or stiffness   Respiratory: Denies SOB, DOE, cough, chest tightness,  and wheezing.   Cardiovascular: Denies chest pain, palpitations and leg swelling.  Gastrointestinal: vomiting, abdominal pain, Denies nausea,  diarrhea, constipation, blood in stool and abdominal distention.  Genitourinary: Denies dysuria,  hematuria, flank pain and difficulty urinating.  Endocrine: Denies: hot or cold intolerance,polyuria, polydipsia. Musculoskeletal: Denies myalgias, back pain, joint pain or swelling Skin: Denies pallor, rash and wound.  Neurological: Denies dizziness,  syncope, weakness, light-headedness, numbness and headaches.  Hematological: Denies adenopathy.  Psychiatric/Behavioral: Denies confusion   Past Medical History  Diagnosis Date  . Coronary artery disease   . Hypertension   . Hypercholesterolemia   . Stroke   . CML (chronic myelocytic leukemia)   . TIA (transient ischemic attack) 05/10/2014   Past Surgical History  Procedure Laterality Date  . Back surgery    . Hip arthroplasty Right     orif  .  Orif forearm fracture Right    Social History:  reports that he has quit smoking. His smoking use  included Cigarettes. He has a 25 pack-year smoking history. He has never used smokeless tobacco. He reports that he drinks about 0.6 oz of alcohol per week. He reports that he does not use illicit drugs.  No Known Allergies  Family History  Problem Relation Age of Onset  . Diabetes Mother   . Hypertension Mother   . Diabetes Father   . Hypertension Father   . Diabetes Brother   . Hypertension Brother   . Diabetes Sister   . Hypertension Sister   . Diabetes Brother   . Hypertension Brother   . Diabetes Sister   . Hypertension Sister     Prior to Admission medications   Medication Sig Start Date End Date Taking? Authorizing Provider  albuterol (PROVENTIL HFA;VENTOLIN HFA) 108 (90 BASE) MCG/ACT inhaler Inhale 2 puffs into the lungs every 4 (four) hours as needed for wheezing or shortness of breath. 08/08/14  Yes Noemi Chapel, MD  ALPRAZolam Duanne Moron) 1 MG tablet Take 1 tablet (1 mg total) by mouth at bedtime as needed for anxiety. 07/31/14  Yes Truitt Merle, MD  atorvastatin (LIPITOR) 40 MG tablet Take 40 mg by mouth every morning.  03/02/14  Yes Historical Provider, MD  carvedilol (COREG) 3.125 MG tablet Take 3.125 mg by mouth 2 (two) times daily. 05/29/14  Yes Historical Provider, MD  clopidogrel (PLAVIX) 75 MG tablet Take 1 tablet (75 mg total) by mouth daily. 03/21/14  Yes [name], MD  dasatinib (SPRYCEL) 100 MG tablet Take 100 mg by mouth daily.   Yes Historical Provider, MD  furosemide (LASIX) 20 MG tablet Take 1 tablet (20 mg total) by mouth daily. 07/31/14  Yes Truitt Merle, MD  imatinib (GLEEVEC) 400 MG tablet Take 1 tablet (400 mg total) by mouth daily. Take with meals and large glass of water.Caution:Chemotherapy. Patient taking differently: Take 200 mg by mouth daily. Take with meals and large glass of water.Caution:Chemotherapy. 03/27/14  Yes Truitt Merle, MD  lisinopril (PRINIVIL,ZESTRIL) 20 MG tablet Take 20 mg by mouth daily. 03/02/14  Yes Historical Provider, MD   oxyCODONE-acetaminophen (PERCOCET/ROXICET) 5-325 MG per tablet Take 1 tablet by mouth 2 (two) times daily as needed for moderate pain or severe pain.  08/21/14  Yes Historical Provider, MD  potassium chloride SA (K-DUR,KLOR-CON) 20 MEQ tablet Take 1 tablet (20 mEq total) by mouth 2 (two) times daily. 07/31/14  Yes Truitt Merle, MD  benzonatate (TESSALON) 100 MG capsule Take 1 capsule (100 mg total) by mouth every 8 (eight) hours. Patient not taking: Reported on 09/13/2014 08/08/14   Noemi Chapel, MD  predniSONE (DELTASONE) 20 MG tablet Take 2 tablets (40 mg total) by mouth daily. Patient not taking: Reported on 09/13/2014 08/08/14   Noemi Chapel, MD     Physical Exam:  Filed Vitals:   09/13/14 1315 09/13/14 1340 09/13/14 1341 09/13/14 1402  BP: 115/91 114/77 105/89 107/77  Pulse: 104 102 101 101  Temp:    97.6 F (36.4 C)  TempSrc:    Oral  Resp: 22 20 21 22   Height:      Weight:      SpO2: 100% 97% 99% 95%    Constitutional: Vital signs reviewed.  Elderly male in no acute distress HEENT: no pallor, no icterus, moist oral mucosa, no cervical lymphadenopathy, no JVD Cardiovascular: RRR, S1 normal, S2 normal, no MRG Chest: CTAB, no wheezes, rales, or rhonchi Abdominal:  Soft. Nondistended, minimal left lower quadrant and right upper quadrant tenderness, bowel sounds present, no organomegaly, no CVA tenderness Ext: warm, no edema Neurological: A&O x3, mild right facial droop, speech normal, normal motor tone and power bilaterally. Normal sensations.  Labs on Admission:  Basic Metabolic Panel:  Recent Labs Lab 09/13/14 1037  NA 138  K 4.9  CL 99*  CO2 15*  GLUCOSE 138*  BUN 49*  CREATININE 2.84*  CALCIUM 9.0   Liver Function Tests:  Recent Labs Lab 09/13/14 1037  AST 542*  ALT 455*  ALKPHOS 300*  BILITOT 4.3*  PROT 7.9  ALBUMIN 4.3   No results for input(s): LIPASE, AMYLASE in the last 168 hours. No results for input(s): AMMONIA in the last 168 hours. CBC:  Recent  Labs Lab 09/13/14 1037  WBC 6.5  NEUTROABS 4.3  HGB 12.1*  HCT 38.1*  MCV 89.4  PLT 529*   Cardiac Enzymes:  Recent Labs Lab 09/13/14 1037  TROPONINI 0.48*   BNP: Invalid input(s): POCBNP CBG: No results for input(s): GLUCAP in the last 168 hours.  Radiological Exams on Admission: Dg Chest 2 View (if Patient Has Fever And/or Copd)  09/13/2014   CLINICAL DATA:  Subsequent encounter for coughing congestion with mild Chest pain for couple of months.  EXAM: CHEST  2 VIEW  COMPARISON:  08/08/2014.  FINDINGS: The lungs are clear without focal infiltrate, edema, pneumothorax or pleural effusion. Cardiopericardial silhouette is at upper limits of normal for size. Imaged bony structures of the thorax are intact. Telemetry leads overlie the chest.  IMPRESSION: Upper normal to mildly enlarged heart.  Otherwise normal exam.   Electronically Signed   By: Misty Stanley M.D.   On: 09/13/2014 11:07    EKG: Independently reviewed, sinus tachycardia at 120 with T-wave inversion in inferior and lateral leads (unchanged), prompt QTC of 544  Assessment/Plan  Principal Problem:   Sepsis with lactic acidosis No clear underlying source. Admit to stepdown unit for close monitoring. Received 2 L IV normal saline bolus in the ED. Sepsis pathway initiated. Will order another 2 L normal saline bolus and monitor lactic acid. Check pro calcitonin, PT/INR. Repeat BMET  to Check for anion gap. -Check CT of the abdomen and pelvis to rule out colitis or ischemia. -Pain control with when necessary IV morphine. Prn Phenergan for N/V.  -Check blood cultures, UA and urine culture. Empiric antibiotic coverage with IV vancomycin and Zosyn. -PCCM consulted.   Active Problems:  Acute transaminitis Check hematemesis panel and urine for drug screen. Follow CT abdomen results. Discontinue statins and avoid Tylenol. -Discussed with hematology consult Dr. Marin Olp who suggested such high levels of LFTs were unlikely  associated with imatinib or desatinib. Agrees to consult as needed. Hold desatinib for now. check cpk.     Chest pain with elevated troponin Appears to be atypical and patient is a very poor historian. Possibly demand ischemia. Has history of cardiomyopathy. Will cycle serial enzymes. Patient asymptomatic at this time. Will not start on anticoagulation this time. Will order 2-D echo. Dr Terrence Dupont will see the patient.  Acute kidney injury Possibly prerenal associated with sepsis . Check UA, urine culture, urine lites. Follow with CT abdomen results. Monitor with IV hydration. Hold Lasix and ACE inhibitor.   ? TIA With hx of CVA  Admit to telemetry  neuro checks q4 hr.  History is unclear. Was seen by Dr Jannifer Franklin earlier this year with plan on MRI brain. MRA hada d carotid doppler. Reports dizzy  spell and slurred speech this am which has mostly resolved now. Will check  MRI brain, MRA head. -Check 2D echo, carotid doppler -PT, OT. reportedly has some residual weakness and right facial trauma. Uses cane for ambulation.  -continue  plavix. Hold statin.    etoh abuse  reports drinking some etoh 1-2 times a week. Reports quitting smoking.  Prolonged QTc check mg. avoid Qt prolonging aqgents   Diet: NPO until CT scan results  DVT prophylaxis: sq heaprin   Code Status: full code Family Communication:  None at bedside Disposition Plan: admit to stepdown  Louellen Molder [facility] Pager [phone]  Total time spent on admission :70 minutes  If 7PM-7AM, please contact night-coverage [email or web address] Password [email or web address] 09/13/2014, 2:06 PM

## 2014-09-13 NOTE — Consult Note (Signed)
PULMONARY / CRITICAL CARE MEDICINE   Name: Bruce [name] MRN: [id number] DOB: 02-20-1946    ADMISSION DATE:  09/13/2014 CONSULTATION DATE:  09/13/14  REFERRING MD :  Dr. Clementeen Graham   CHIEF COMPLAINT:  Weakness   INITIAL PRESENTATION: 69 y/o M, Jehovah's Witness, smoker / ETOH use, with PMH of CML who presented to [facility] ER with reports of worsening SOB.  He was found to have AKI, mild tachycardia and elevated lactic acid.  PCCM consulted for evaluation.    STUDIES:  6/03  MRI Brain >>  6/03  CT ABD >>   SIGNIFICANT EVENTS: 6/03  Admit per [facility] with worsening SOB, AKI, mild tachycardia and elevated lactic acid   HISTORY OF PRESENT ILLNESS:  69 y/o M, Jehovah's Witness, former smoker / ETOH use, with PMH of CAD, HTN, HLD, NICM with an EF of 40%, CVA / TIA's (with residual facial droop & partial L eye blindness), back surgery, Bells Palsy s/p injections and Chronic Myelocytic Leukemia (diagnosed in November 2015, with recent medication change from [place] to Sprycel due to elevated LFT's and rectal bleeding, followed by Dr. Burr Medico) who presented to [facility] ER on 6/03 with complaints of weakness, "not feeling right", shortness of breath, low abdominal pain, coughing.     Initial ER evaluation was notable for temp 97.6, HR 118, R 18, BP 116/76, 96% on RA.  Labs:  WBC 6.5, Hgb 12.1, Platelets 529, troponin 0.48, Na 138, K 4.9, CO2 15, Cr 2.84 (up from 1.26), ALT 455, Alk Phos 300, T. Bili 4.3, and initial lactic acid of 8.79.  Repeat troponin decreased to 6.82 after two liters of NS.  CXR was evaluated and without acute process.  EKG showed sinus tachycardia, prolonged QT of 544, and T-wave inversion in inferior / lateral leads (unchanged).    He reported two days ago he had an episode of vomiting.  Since that time he had crampy lower abdominal pain.  He aslo had substernal chest pain that did not radiate.  He also reports generalized weakness, dizziness and slurred speech.  He notes approximately  20+lbs weight loss over the past 3-4 months.  The patient denied syncope, pre-syncope, pain that moved into his arms, pain with inspiration, palpitations  At baseline, he lives independently and walks to the bus stop for transportation.  He is independent of all ADL's.    PCCM consulted for evaluation given elevated lactic acid and immunocompromised state.      PAST MEDICAL HISTORY :   has a past medical history of Coronary artery disease; Hypertension; Hypercholesterolemia; Stroke; CML (chronic myelocytic leukemia); and TIA (transient ischemic attack) (05/10/2014).  has past surgical history that includes Back surgery; Hip Arthroplasty (Right); and ORIF forearm fracture (Right).   Prior to Admission medications   Medication Sig Start Date End Date Taking? Authorizing Provider  albuterol (PROVENTIL HFA;VENTOLIN HFA) 108 (90 BASE) MCG/ACT inhaler Inhale 2 puffs into the lungs every 4 (four) hours as needed for wheezing or shortness of breath. 08/08/14  Yes Noemi Chapel, MD  ALPRAZolam Duanne Moron) 1 MG tablet Take 1 tablet (1 mg total) by mouth at bedtime as needed for anxiety. 07/31/14  Yes Truitt Merle, MD  atorvastatin (LIPITOR) 40 MG tablet Take 40 mg by mouth every morning.  03/02/14  Yes Historical Provider, MD  carvedilol (COREG) 3.125 MG tablet Take 3.125 mg by mouth 2 (two) times daily. 05/29/14  Yes Historical Provider, MD  clopidogrel (PLAVIX) 75 MG tablet Take 1 tablet (75 mg total) by mouth  daily. 03/21/14  Yes [name], MD  dasatinib (SPRYCEL) 100 MG tablet Take 100 mg by mouth daily.   Yes Historical Provider, MD  furosemide (LASIX) 20 MG tablet Take 1 tablet (20 mg total) by mouth daily. 07/31/14  Yes Truitt Merle, MD  imatinib (GLEEVEC) 400 MG tablet Take 1 tablet (400 mg total) by mouth daily. Take with meals and large glass of water.Caution:Chemotherapy. Patient taking differently: Take 200 mg by mouth daily. Take with meals and large glass of water.Caution:Chemotherapy. 03/27/14  Yes Truitt Merle, MD  lisinopril (PRINIVIL,ZESTRIL) 20 MG tablet Take 20 mg by mouth daily. 03/02/14  Yes Historical Provider, MD  oxyCODONE-acetaminophen (PERCOCET/ROXICET) 5-325 MG per tablet Take 1 tablet by mouth 2 (two) times daily as needed for moderate pain or severe pain.  08/21/14  Yes Historical Provider, MD  potassium chloride SA (K-DUR,KLOR-CON) 20 MEQ tablet Take 1 tablet (20 mEq total) by mouth 2 (two) times daily. 07/31/14  Yes Truitt Merle, MD  benzonatate (TESSALON) 100 MG capsule Take 1 capsule (100 mg total) by mouth every 8 (eight) hours. Patient not taking: Reported on 09/13/2014 08/08/14   Noemi Chapel, MD  predniSONE (DELTASONE) 20 MG tablet Take 2 tablets (40 mg total) by mouth daily. Patient not taking: Reported on 09/13/2014 08/08/14   Noemi Chapel, MD   No Known Allergies  FAMILY HISTORY:  indicated that his mother is deceased. He indicated that his father is deceased. He indicated that only one of his three sisters is alive. He indicated that only one of his three brothers is alive.    SOCIAL HISTORY:  reports that he has quit smoking. His smoking use included Cigarettes. He has a 25 pack-year smoking history. He has never used smokeless tobacco. He reports that he drinks about 0.6 oz of alcohol per week. He reports that he does not use illicit drugs.  REVIEW OF SYSTEMS:   Gen: Denies fever, chills, night sweats.  Reports fatigue, 20+ lb weight loss  HEENT: Denies blurred vision, double vision, hearing loss, tinnitus, sinus congestion, rhinorrhea, sore throat, neck stiffness, dysphagia PULM: Denies sputum production, hemoptysis, wheezing.  Reports shortness of breath, dry cough CV: Denies edema, orthopnea, paroxysmal nocturnal dyspnea, palpitations.  Reports rib soreness from coughing GI: Denies diarrhea, hematochezia, melena, constipation, change in bowel habits.  Reports abdominal pain, nausea, vomiting GU: Denies dysuria, hematuria, polyuria, oliguria, urethral discharge Endocrine:  Denies hot or cold intolerance, polyuria, polyphagia or appetite change Derm: Denies rash, dry skin, scaling or peeling skin change Heme: Denies easy bruising, bleeding, bleeding gums Neuro: Denies headache, numbness, loss of memory or consciousness.  Reports generalized weakness and mild slurring of speech.   SUBJECTIVE:   VITAL SIGNS: Temp:  [97.6 F (36.4 C)-98.1 F (36.7 C)] 97.6 F (36.4 C) (06/03 1402) Pulse Rate:  [101-119] 102 (06/03 1420) Resp:  [16-25] 18 (06/03 1420) BP: (105-144)/(65-91) 144/74 mmHg (06/03 1420) SpO2:  [93 %-100 %] 94 % (06/03 1420) Weight:  [170 lb (77.111 kg)] 170 lb (77.111 kg) (06/03 1030)   HEMODYNAMICS:     VENTILATOR SETTINGS:     INTAKE / OUTPUT: No intake or output data in the 24 hours ending 09/13/14 1444  PHYSICAL EXAMINATION: General:  wdwn adult male in NAD  Neuro:  AAOx4, speech clear, MAE HEENT:  MM pink/moist, no jvd, R eye ptosis, mild L facial droop Cardiovascular:  s1s2 rrr, no m/r/g Lungs:  resp's even/non-labored, lungs bilaterally clear  Abdomen:  Round/soft, bsx4 active  Musculoskeletal:  No acute deformities  Skin:  Warm/dry, no edema  LABS:  CBC  Recent Labs Lab 09/13/14 1037  WBC 6.5  HGB 12.1*  HCT 38.1*  PLT 529*   Coag's No results for input(s): APTT, INR in the last 168 hours.   BMET  Recent Labs Lab 09/13/14 1037  NA 138  K 4.9  CL 99*  CO2 15*  BUN 49*  CREATININE 2.84*  GLUCOSE 138*   Electrolytes  Recent Labs Lab 09/13/14 1037  CALCIUM 9.0   Sepsis Markers  Recent Labs Lab 09/13/14 1044 09/13/14 1341  LATICACIDVEN 8.79* 6.82*   ABG No results for input(s): PHART, PCO2ART, PO2ART in the last 168 hours.   Liver Enzymes  Recent Labs Lab 09/13/14 1037  AST 542*  ALT 455*  ALKPHOS 300*  BILITOT 4.3*  ALBUMIN 4.3   Cardiac Enzymes  Recent Labs Lab 09/13/14 1037  TROPONINI 0.48*   Glucose No results for input(s): GLUCAP in the last 168 hours.  Imaging Dg  Chest 2 View (if Patient Has Fever And/or Copd)  09/13/2014   CLINICAL DATA:  Subsequent encounter for coughing congestion with mild Chest pain for couple of months.  EXAM: CHEST  2 VIEW  COMPARISON:  08/08/2014.  FINDINGS: The lungs are clear without focal infiltrate, edema, pneumothorax or pleural effusion. Cardiopericardial silhouette is at upper limits of normal for size. Imaged bony structures of the thorax are intact. Telemetry leads overlie the chest.  IMPRESSION: Upper normal to mildly enlarged heart.  Otherwise normal exam.   Electronically Signed   By: Misty Stanley M.D.   On: 09/13/2014 11:07     ASSESSMENT / PLAN:  PULMONARY OETT A: Dyspnea - without hypoxemia, clear CXR Cough - recent bronchitis with admission  P:   Pulmonary hygiene:  IS, mobilize as able Oxygen as needed to support sats > 90%  Delsym for cough   CARDIOVASCULAR CVL A:  Chest Pain  Elevated Troponin - r/o NSTEMI, EKG without ST elevation, showed ST, prolonged QT & T wave inversion  Hx HTN, HLD, CAD, NICM (EF 40%) P:  Hold home lasix, lisinopril for now  Continue home plavix, coreg for now Tele monitoring  Trend Troponin  Assess ECHO Gentle hydration   RENAL A:   Anion Gap Metabolic Acidosis / Lactic Acidosis - unclear etiology, r/o sepsis source Rule out Rhabdomyolysis  P:   Trend BMP / UOP  Replace electrolytes as indicated  Assess UDS  Trend lacitc acid  Assess CK, uric acid >> r/o tumor lysis  Hold nephrotoxic agents   GASTROINTESTINAL A:   Abdominal Pain  Elevated Transaminitis - ?? Sprycel related  P:   Trend LFT's  NPO for now  CT ABD  HEMATOLOGIC A:   CML - followed by Dr. Curt Bears Witness  Mild Anemia - recent rectal bleeding on Gleevec  P:  Defer CML regimen to Dr. Marin Olp / Primary SVC  Trend CBC  SCD's for DVT prophylaxis   INFECTIOUS A:   Elevated Lactic Acid - does not meet SIRS criteria  Immunocompromised State  P:   BCx2 6/03 >>  UA 6/03 >>  UC  6/03 >>  Sputum 6/03 >>   Vanco, start date 6/3 >>  Zosyn, start date 6/3 >>   ENDOCRINE A:   Mild Hyperglycemia  P:   Monitor glucose, if consistently > 180, add SSI   NEUROLOGIC A:   Hx CVA, Bells Palsy with residual facial droop and L eye blindness Cane Dependent at Baseline P:  Minimize sedation  MRI head to r/o new neurologic event    FAMILY  - Updates:  No family available.    - Inter-disciplinary family meet or Palliative Care meeting due by:  6/10   GLOBAL:  Medication profile of Sprycel reviewed - can cause prolonged QT, rhabdo, facial palsy, elevated LFT's, renal failure >> ?? If patients current collection of symptoms are medication related.  He does not meet SIRS criteria but given his immunocompromised state, treat empirically for possible infection until it can be ruled out.    Noe Gens, NP-C [facility] Pgr: [phone] or [phone]  09/13/2014, 2:44 PM

## 2014-09-13 NOTE — ED Notes (Signed)
Hospitalist at bedside 

## 2014-09-13 NOTE — H&P (Signed)
[facility] History and Physical  Bruce [name] WNI:[id number] DOB: 1945/06/08 DOA: 09/13/2014  Referring physician: Dr. Davonna Belling PCP: [name], MD   Chief Complaint: Abdominal pain, chest pain and generalized weakness since one day  HPI:  69 year old jehovah's witness male with history of CML (diagnosed in November 2015) on Gleevac switched to desatinib recently due to side effects (? Elevated LFTs/ rectal bleed), coronary artery disease, nonischemic cardiomyopathy with EF of 40%, tobacco and alcohol use, hypertension, hyperlipidemia, history of TIAs (with some residual facial droop and partial left eye blindness) presented to the ED with lower abdominal pain and substernal chest pain since yesterday. Patient is a very poor historian. Patient reports having an episode of vomiting 2 days back. Also reported generalized weakness. Since yesterday, he has  been having crampy lower abdominal pain which radiates to his substernal area. Denies chest pain radiating to his arms. This morning he also felt dizzy and noticed some slurred speech.  Patient denies headache, dizziness, fever, chills, nausea , palpitations, SOB, bowel or urinary symptoms. Denies change in weight but has poor appetite and not drinking enough fluid. Denies any jaundice, tingling or numbness in his arms or legs.  Course in the ED Patient was afebrile but found to be tachycardic and tachypneic. Her pressure and O2 sats were normal. Blood will done showed no basilar 6.5, hemoglobin of 12.1, platelets 529. Sodium of 138, potassium of 4.9, with an end gap of 24. Patient also had acute kidney injury reviewed 49 and creatinine of 2.84. Troponin was elevated to 0.48. LFTs were markedly elevated with AST of 542, ALT of 455, alkaline phosphatase of 300, total bilirubin of 4.3 (direct: 2.2). Lactic acid was markedly elevated to 8.79, which improved to 6.82 after 2 L IV normal saline bolus in the ED. EKG showed sinus  tachycardia at 120 with prolonged QTC of 544 and T-wave inversion in inferior and lateral leads which seems unchanged from prior study. Chest x-ray was unremarkable. Cultures were ordered from the ED and patient received an empiric dose of IV vancomycin and Zosyn. Patient reports his abdominal pain to be better after coming to the ED. Cardiologist  Dr. Terrence Dupont was consulted by ED physician. I also spoke with Dr.Ennever who was covering for patient's hematologist Dr Burr Medico who suggested is that acute transaminitis is not very common with Gerald or Dasatanib.  Admitted to hospitalist service on stepdown unit.   Review of Systems:  Constitutional: Denies fever, chills, diaphoresis, poor appetite and fatigue.  HEENT: Denies visual or hearing symptoms, congestion, difficulty swallowing, neck pain or stiffness   Respiratory: Denies SOB, DOE, cough, chest tightness,  and wheezing.   Cardiovascular: Denies chest pain, palpitations and leg swelling.  Gastrointestinal: vomiting, abdominal pain, Denies nausea,  diarrhea, constipation, blood in stool and abdominal distention.  Genitourinary: Denies dysuria,  hematuria, flank pain and difficulty urinating.  Endocrine: Denies: hot or cold intolerance,polyuria, polydipsia. Musculoskeletal: Denies myalgias, back pain, joint pain or swelling Skin: Denies pallor, rash and wound.  Neurological: Denies dizziness,  syncope, weakness, light-headedness, numbness and headaches.  Hematological: Denies adenopathy.  Psychiatric/Behavioral: Denies confusion   Past Medical History  Diagnosis Date  . Coronary artery disease   . Hypertension   . Hypercholesterolemia   . Stroke     No residual limb weakness.  Walks with cane at baseline.   Marland Kitchen CML (chronic myelocytic leukemia)   . TIA (transient ischemic attack) 05/10/2014  . Bell's palsy    Past Surgical History  Procedure Laterality  Date  . Back surgery    . Hip arthroplasty Right     orif  . Orif forearm  fracture Right    Social History:  reports that he has quit smoking. His smoking use included Cigarettes. He has a 25 pack-year smoking history. He has never used smokeless tobacco. He reports that he drinks about 0.6 oz of alcohol per week. He reports that he does not use illicit drugs.  No Known Allergies  Family History  Problem Relation Age of Onset  . Diabetes Mother   . Hypertension Mother   . Diabetes Father   . Hypertension Father   . Diabetes Brother   . Hypertension Brother   . Diabetes Sister   . Hypertension Sister   . Diabetes Brother   . Hypertension Brother   . Diabetes Sister   . Hypertension Sister     Prior to Admission medications   Medication Sig Start Date End Date Taking? Authorizing Provider  albuterol (PROVENTIL HFA;VENTOLIN HFA) 108 (90 BASE) MCG/ACT inhaler Inhale 2 puffs into the lungs every 4 (four) hours as needed for wheezing or shortness of breath. 08/08/14  Yes Noemi Chapel, MD  ALPRAZolam Duanne Moron) 1 MG tablet Take 1 tablet (1 mg total) by mouth at bedtime as needed for anxiety. 07/31/14  Yes Truitt Merle, MD  atorvastatin (LIPITOR) 40 MG tablet Take 40 mg by mouth every morning.  03/02/14  Yes Historical Provider, MD  carvedilol (COREG) 3.125 MG tablet Take 3.125 mg by mouth 2 (two) times daily. 05/29/14  Yes Historical Provider, MD  clopidogrel (PLAVIX) 75 MG tablet Take 1 tablet (75 mg total) by mouth daily. 03/21/14  Yes [name], MD  dasatinib (SPRYCEL) 100 MG tablet Take 100 mg by mouth daily.   Yes Historical Provider, MD  furosemide (LASIX) 20 MG tablet Take 1 tablet (20 mg total) by mouth daily. 07/31/14  Yes Truitt Merle, MD  imatinib (GLEEVEC) 400 MG tablet Take 1 tablet (400 mg total) by mouth daily. Take with meals and large glass of water.Caution:Chemotherapy. Patient taking differently: Take 200 mg by mouth daily. Take with meals and large glass of water.Caution:Chemotherapy. 03/27/14  Yes Truitt Merle, MD  lisinopril (PRINIVIL,ZESTRIL) 20 MG tablet  Take 20 mg by mouth daily. 03/02/14  Yes Historical Provider, MD  oxyCODONE-acetaminophen (PERCOCET/ROXICET) 5-325 MG per tablet Take 1 tablet by mouth 2 (two) times daily as needed for moderate pain or severe pain.  08/21/14  Yes Historical Provider, MD  potassium chloride SA (K-DUR,KLOR-CON) 20 MEQ tablet Take 1 tablet (20 mEq total) by mouth 2 (two) times daily. 07/31/14  Yes Truitt Merle, MD  benzonatate (TESSALON) 100 MG capsule Take 1 capsule (100 mg total) by mouth every 8 (eight) hours. Patient not taking: Reported on 09/13/2014 08/08/14   Noemi Chapel, MD  predniSONE (DELTASONE) 20 MG tablet Take 2 tablets (40 mg total) by mouth daily. Patient not taking: Reported on 09/13/2014 08/08/14   Noemi Chapel, MD     Physical Exam:  Filed Vitals:   09/13/14 1402 09/13/14 1420 09/13/14 1534 09/13/14 1604  BP: 107/77 144/74 110/74 132/85  Pulse: 101 102 101 98  Temp: 97.6 F (36.4 C)     TempSrc: Oral     Resp: 22 18 22 22   Height:      Weight:      SpO2: 95% 94% 100% 95%    Constitutional: Vital signs reviewed.  Elderly male in no acute distress HEENT: no pallor, no icterus, moist oral mucosa, no cervical  lymphadenopathy, no JVD Cardiovascular: RRR, S1 normal, S2 normal, no MRG Chest: CTAB, no wheezes, rales, or rhonchi Abdominal: Soft. Nondistended, minimal left lower quadrant and right upper quadrant tenderness, bowel sounds present, no organomegaly, no CVA tenderness Ext: warm, no edema Neurological: A&O x3, mild right facial droop, speech normal, normal motor tone and power bilaterally. Normal sensations.  Labs on Admission:  Basic Metabolic Panel:  Recent Labs Lab 09/13/14 1037 09/13/14 1451  NA 138  --   K 4.9  --   CL 99*  --   CO2 15*  --   GLUCOSE 138*  --   BUN 49*  --   CREATININE 2.84*  --   CALCIUM 9.0  --   MG  --  2.6*   Liver Function Tests:  Recent Labs Lab 09/13/14 1037  AST 542*  ALT 455*  ALKPHOS 300*  BILITOT 4.3*  PROT 7.9  ALBUMIN 4.3   No  results for input(s): LIPASE, AMYLASE in the last 168 hours. No results for input(s): AMMONIA in the last 168 hours. CBC:  Recent Labs Lab 09/13/14 1037  WBC 6.5  NEUTROABS 4.3  HGB 12.1*  HCT 38.1*  MCV 89.4  PLT 529*   Cardiac Enzymes:  Recent Labs Lab 09/13/14 1037  TROPONINI 0.48*   BNP: Invalid input(s): POCBNP CBG: No results for input(s): GLUCAP in the last 168 hours.  Radiological Exams on Admission: Ct Abdomen Pelvis Wo Contrast  09/13/2014   CLINICAL DATA:  Sepsis.  EXAM: CT ABDOMEN AND PELVIS WITHOUT CONTRAST  TECHNIQUE: Multidetector CT imaging of the abdomen and pelvis was performed following the standard protocol without IV contrast.  COMPARISON:  CT scan of March 18, 2014.  FINDINGS: Visualized lung bases appear normal. No significant osseous abnormality is noted.  No gallstones are noted. No focal abnormality is noted in the liver, spleen or pancreas on these unenhanced images. Adrenal glands and kidneys appear normal. No hydronephrosis or renal obstruction is noted. No renal or ureteral calculi are noted. The appendix appears normal. There is no evidence of bowel obstruction. Atherosclerosis of abdominal aorta is noted without aneurysm formation. Urinary bladder appears normal. No abnormal fluid collection is noted. No significant adenopathy is noted.  IMPRESSION: No acute abnormality seen in the abdomen or pelvis.   Electronically Signed   By: Marijo Conception, M.D.   On: 09/13/2014 16:17   Dg Chest 2 View (if Patient Has Fever And/or Copd)  09/13/2014   CLINICAL DATA:  Subsequent encounter for coughing congestion with mild Chest pain for couple of months.  EXAM: CHEST  2 VIEW  COMPARISON:  08/08/2014.  FINDINGS: The lungs are clear without focal infiltrate, edema, pneumothorax or pleural effusion. Cardiopericardial silhouette is at upper limits of normal for size. Imaged bony structures of the thorax are intact. Telemetry leads overlie the chest.  IMPRESSION: Upper  normal to mildly enlarged heart.  Otherwise normal exam.   Electronically Signed   By: Misty Stanley M.D.   On: 09/13/2014 11:07    EKG: Independently reviewed, sinus tachycardia at 120 with T-wave inversion in inferior and lateral leads (unchanged), prompt QTC of 544  Assessment/Plan  Principal Problem:   Sepsis with lactic acidosis No clear underlying source. Admit to stepdown unit for close monitoring. Received 2 L IV normal saline bolus in the ED. Sepsis pathway initiated. Will order another 2 L normal saline bolus and monitor lactic acid. Check pro calcitonin, PT/INR. Repeat BMET  to Check for anion gap. -Check CT of  the abdomen and pelvis to rule out colitis or ischemia. -Pain control with when necessary IV morphine. Prn Phenergan for N/V.  -Check blood cultures, UA and urine culture. Empiric antibiotic coverage with IV vancomycin and Zosyn. -PCCM consulted.   Active Problems:  Acute transaminitis Check hematemesis panel and urine for drug screen. Follow CT abdomen results. Discontinue statins and avoid Tylenol. -Discussed with hematology consult Dr. Marin Olp who suggested such high levels of LFTs were unlikely associated with imatinib or desatinib. Agrees to consult as needed. Hold desatinib for now. check cpk.     Chest pain with elevated troponin Appears to be atypical and patient is a very poor historian. Possibly demand ischemia. Has history of cardiomyopathy. Will cycle serial enzymes. Patient asymptomatic at this time. Will not start on anticoagulation this time. Will order 2-D echo. Dr Terrence Dupont will see the patient.  Acute kidney injury Possibly prerenal associated with sepsis . Check UA, urine culture, urine lites. Follow with CT abdomen results. Monitor with IV hydration. Hold Lasix and ACE inhibitor.   ? TIA With hx of CVA  Admit to telemetry  neuro checks q4 hr.  History is unclear. Was seen by Dr Jannifer Franklin earlier this year with plan on MRI brain. MRA hada d carotid  doppler. Reports dizzy spell and slurred speech this am which has mostly resolved now. Will check  MRI brain, MRA head. -Check 2D echo, carotid doppler -PT, OT. reportedly has some residual weakness and right facial trauma. Uses cane for ambulation.  -continue  plavix. Hold statin.   CAD with hx of  cardiomyopathy Compensated. Monitoring IV resuscitation. Follow 2D echo  etoh abuse  reports drinking some etoh 1-2 times a week. Reports quitting smoking.  Prolonged QTc check mg. avoid Qt prolonging aqgents   Diet: NPO until CT scan results  DVT prophylaxis: sq heaprin   Code Status: full code Family Communication:  None at bedside Disposition Plan: admit to stepdown  Louellen Molder [facility] Pager [phone]  Total time spent on admission :70 minutes  If 7PM-7AM, please contact night-coverage [email or web address] Password [facility] 09/13/2014, 4:24 PM

## 2014-09-13 NOTE — ED Provider Notes (Signed)
CSN: [id number]     Arrival date & time 09/13/14  1601 History   First MD Initiated Contact with Patient 09/13/14 1039     Chief Complaint  Patient presents with  . Shortness of Breath     (Consider location/radiation/quality/duration/timing/severity/associated sxs/prior Treatment) Patient is a 69 y.o. male presenting with shortness of breath. The history is provided by the patient.  Shortness of Breath Associated symptoms: abdominal pain, chest pain, diaphoresis and fever   Associated symptoms: no headaches, no rash and no vomiting    patient presents with generalized weakness over last few days. Has had a cough with some sputum production. Has a history of CML and is currently on oral treatment. States he has had fevers and diaphoresis that home. He does have some chest pain. States he's been having some difficulty walking due to his overall weakness. Recently was treated for bronchitis. Had negative CTA at that time a month ago. No headache. No confusion.  Past Medical History  Diagnosis Date  . Coronary artery disease   . Hypertension   . Hypercholesterolemia   . Stroke     No residual limb weakness.  Walks with cane at baseline.   Marland Kitchen CML (chronic myelocytic leukemia)   . TIA (transient ischemic attack) 05/10/2014  . Bell's palsy    Past Surgical History  Procedure Laterality Date  . Back surgery    . Hip arthroplasty Right     orif  . Orif forearm fracture Right    Family History  Problem Relation Age of Onset  . Diabetes Mother   . Hypertension Mother   . Diabetes Father   . Hypertension Father   . Diabetes Brother   . Hypertension Brother   . Diabetes Sister   . Hypertension Sister   . Diabetes Brother   . Hypertension Brother   . Diabetes Sister   . Hypertension Sister    History  Substance Use Topics  . Smoking status: Former Smoker -- 0.50 packs/day for 50 years    Types: Cigarettes  . Smokeless tobacco: Never Used  . Alcohol Use: 0.6 oz/week    1 Cans of  beer per week     Comment: daily     Review of Systems  Constitutional: Positive for fever and diaphoresis. Negative for activity change and appetite change.  Eyes: Negative for pain.  Respiratory: Positive for shortness of breath. Negative for chest tightness.   Cardiovascular: Positive for chest pain. Negative for leg swelling.  Gastrointestinal: Positive for abdominal pain. Negative for nausea, vomiting and diarrhea.  Genitourinary: Negative for flank pain.  Musculoskeletal: Negative for back pain and neck stiffness.  Skin: Negative for rash.  Neurological: Positive for weakness. Negative for numbness and headaches.  Psychiatric/Behavioral: Negative for behavioral problems.      Allergies  Review of patient's allergies indicates no known allergies.  Home Medications   Prior to Admission medications   Medication Sig Start Date End Date Taking? Authorizing Provider  albuterol (PROVENTIL HFA;VENTOLIN HFA) 108 (90 BASE) MCG/ACT inhaler Inhale 2 puffs into the lungs every 4 (four) hours as needed for wheezing or shortness of breath. 08/08/14  Yes Noemi Chapel, MD  ALPRAZolam Duanne Moron) 1 MG tablet Take 1 tablet (1 mg total) by mouth at bedtime as needed for anxiety. 07/31/14  Yes Truitt Merle, MD  atorvastatin (LIPITOR) 40 MG tablet Take 40 mg by mouth every morning.  03/02/14  Yes Historical Provider, MD  carvedilol (COREG) 3.125 MG tablet Take 3.125 mg by mouth 2 (  two) times daily. 05/29/14  Yes Historical Provider, MD  clopidogrel (PLAVIX) 75 MG tablet Take 1 tablet (75 mg total) by mouth daily. 03/21/14  Yes [name], MD  dasatinib (SPRYCEL) 100 MG tablet Take 100 mg by mouth daily.   Yes Historical Provider, MD  furosemide (LASIX) 20 MG tablet Take 1 tablet (20 mg total) by mouth daily. 07/31/14  Yes Truitt Merle, MD  lisinopril (PRINIVIL,ZESTRIL) 20 MG tablet Take 20 mg by mouth daily. 03/02/14  Yes Historical Provider, MD  oxyCODONE-acetaminophen (PERCOCET/ROXICET) 5-325 MG per tablet  Take 1 tablet by mouth 2 (two) times daily as needed for moderate pain or severe pain.  08/21/14  Yes Historical Provider, MD  potassium chloride SA (K-DUR,KLOR-CON) 20 MEQ tablet Take 1 tablet (20 mEq total) by mouth 2 (two) times daily. 07/31/14  Yes Truitt Merle, MD  benzonatate (TESSALON) 100 MG capsule Take 1 capsule (100 mg total) by mouth every 8 (eight) hours. Patient not taking: Reported on 09/13/2014 08/08/14   Noemi Chapel, MD  imatinib (GLEEVEC) 400 MG tablet Take 1 tablet (400 mg total) by mouth daily. Take with meals and large glass of water.Caution:Chemotherapy. Patient not taking: Reported on 09/13/2014 03/27/14   Truitt Merle, MD  predniSONE (DELTASONE) 20 MG tablet Take 2 tablets (40 mg total) by mouth daily. Patient not taking: Reported on 09/13/2014 08/08/14   Noemi Chapel, MD   BP 132/85 mmHg  Pulse 98  Temp(Src) 97.6 F (36.4 C) (Oral)  Resp 22  Ht 5\' 8"  (1.727 m)  Wt 170 lb (77.111 kg)  BMI 25.85 kg/m2  SpO2 95% Physical Exam  Constitutional: He appears well-developed.  Neck: Neck supple.  Cardiovascular:  Mild tachycardia  Pulmonary/Chest: Effort normal.  Mildly harsh breath sounds without localizing rales or wheezes  Abdominal: Soft. There is no tenderness.  Musculoskeletal: He exhibits no edema.  Neurological: He is alert.  Skin: Skin is warm.    ED Course  Procedures (including critical care time) Labs Review Labs Reviewed  BASIC METABOLIC PANEL - Abnormal; Notable for the following:    Chloride 99 (*)    CO2 15 (*)    Glucose, Bld 138 (*)    BUN 49 (*)    Creatinine, Ser 2.84 (*)    GFR calc non Af Amer 21 (*)    GFR calc Af Amer 25 (*)    Anion gap 24 (*)    All other components within normal limits  CBC - Abnormal; Notable for the following:    Hemoglobin 12.1 (*)    HCT 38.1 (*)    RDW 16.4 (*)    Platelets 529 (*)    All other components within normal limits  TROPONIN I - Abnormal; Notable for the following:    Troponin I 0.48 (*)    All other  components within normal limits  HEPATIC FUNCTION PANEL - Abnormal; Notable for the following:    AST 542 (*)    ALT 455 (*)    Alkaline Phosphatase 300 (*)    Total Bilirubin 4.3 (*)    Bilirubin, Direct 2.2 (*)    Indirect Bilirubin 2.1 (*)    All other components within normal limits  DIFFERENTIAL - Abnormal; Notable for the following:    Monocytes Relative 15 (*)    All other components within normal limits  PROTIME-INR - Abnormal; Notable for the following:    Prothrombin Time 25.5 (*)    INR 2.35 (*)    All other components within normal limits  MAGNESIUM - Abnormal; Notable for the following:    Magnesium 2.6 (*)    All other components within normal limits  I-STAT CG4 LACTIC ACID, ED - Abnormal; Notable for the following:    Lactic Acid, Venous 8.79 (*)    All other components within normal limits  I-STAT CG4 LACTIC ACID, ED - Abnormal; Notable for the following:    Lactic Acid, Venous 6.82 (*)    All other components within normal limits  CULTURE, BLOOD (ROUTINE X 2)  CULTURE, BLOOD (ROUTINE X 2)  URINE CULTURE  PROCALCITONIN  APTT  CBC WITH DIFFERENTIAL/PLATELET  HEPATITIS PANEL, ACUTE  URINE RAPID DRUG SCREEN (HOSP PERFORMED) NOT AT [facility]  URINALYSIS, ROUTINE W REFLEX MICROSCOPIC (NOT AT [facility])  SODIUM, URINE, RANDOM  CREATININE, URINE, RANDOM  URIC ACID  CK  AMYLASE  LIPASE, BLOOD  PROCALCITONIN  HEPATIC FUNCTION PANEL  TROPONIN I  TROPONIN I  I-STAT CG4 LACTIC ACID, ED    Imaging Review Dg Chest 2 View (if Patient Has Fever And/or Copd)  09/13/2014   CLINICAL DATA:  Subsequent encounter for coughing congestion with mild Chest pain for couple of months.  EXAM: CHEST  2 VIEW  COMPARISON:  08/08/2014.  FINDINGS: The lungs are clear without focal infiltrate, edema, pneumothorax or pleural effusion. Cardiopericardial silhouette is at upper limits of normal for size. Imaged bony structures of the thorax are intact. Telemetry leads overlie the chest.  IMPRESSION:  Upper normal to mildly enlarged heart.  Otherwise normal exam.   Electronically Signed   By: Misty Stanley M.D.   On: 09/13/2014 11:07     EKG Interpretation   Date/Time:  Friday September 13 2014 10:25:35 EDT Ventricular Rate:  120 PR Interval:  175 QRS Duration: 94 QT Interval:  385 QTC Calculation: 544 R Axis:   12 Text Interpretation:  Sinus tachycardia Probable left atrial enlargement  Anteroseptal infarct, old Prolonged QT interval Baseline wander in lead(s)  I Confirmed by Auburn Hester  MD, Treysen Sudbeck [phone]) on 09/13/2014 10:41:21 AM      MDM   Final diagnoses:  Sepsis, due to unspecified organism  Renal insufficiency  Lactic acidosis  CML (chronic myelocytic leukemia)    Patient with tachycardia and feeling weak. Has CML and is on some oral therapy for it. Elevated lactate. Elevated creatinine. Not clear source of infection but due to [facility] will treat for infection. Fluid boluses given. Troponin is mildly elevated but EKG is reassuring. Will admit to internal medicine in a stepdown bed and they state they will consult critical care.  CRITICAL CARE Performed by: Mackie Pai Total critical care time: 30 Critical care time was exclusive of separately billable procedures and treating other patients. Critical care was necessary to treat or prevent imminent or life-threatening deterioration. Critical care was time spent personally by me on the following activities: development of treatment plan with patient and/or surrogate as well as nursing, discussions with consultants, evaluation of patient's response to treatment, examination of patient, obtaining history from patient or surrogate, ordering and performing treatments and interventions, ordering and review of laboratory studies, ordering and review of radiographic studies, pulse oximetry and re-evaluation of patient's condition.    Davonna Belling, MD 09/13/14 [phone]

## 2014-09-13 NOTE — ED Notes (Signed)
Patient transported to MRI 

## 2014-09-13 NOTE — ED Notes (Signed)
Bed: WA08 Expected date:  Expected time:  Means of arrival:  Comments: 

## 2014-09-13 NOTE — Progress Notes (Signed)
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

## 2014-09-13 NOTE — ED Notes (Signed)
Dhungel notified that pt refuses MRI; pt encouraged to take sedative and educated importance of procedure. Pt continues to refuse and made aware of risks associated with refusal of procedure.

## 2014-09-13 NOTE — ED Notes (Signed)
Pt transported to DG.  

## 2014-09-13 NOTE — ED Notes (Signed)
Per EMS pt diagnoses with bronchitis; complaint of worsening SOB.

## 2014-09-13 NOTE — ED Notes (Signed)
Pt signed consent for refusal to receive blood products; pt reports Jehovah witness and DOES NOT WANT blood products if he were to need them.

## 2014-09-13 NOTE — ED Notes (Signed)
Critical care MD at bedside 

## 2014-09-13 NOTE — ED Notes (Signed)
Lactic 6.82 Dr Alvino Chapel notified

## 2014-09-13 NOTE — ED Notes (Signed)
Pt transported to CT at present time. 

## 2014-09-13 NOTE — Progress Notes (Signed)
ANTIBIOTIC CONSULT NOTE - INITIAL  Pharmacy Consult for vancomycin and zosyn Indication: sepsis  No Known Allergies  Patient Measurements: Height: 5\' 8"  (172.7 cm) Weight: 170 lb (77.111 kg) IBW/kg (Calculated) : 68.4   Vital Signs: Temp: 97.6 F (36.4 C) (06/03 1402) Temp Source: Oral (06/03 1402) BP: 107/77 mmHg (06/03 1402) Pulse Rate: 101 (06/03 1402) Intake/Output from previous day:   Intake/Output from this shift:    Labs:  Recent Labs  09/13/14 1037  WBC 6.5  HGB 12.1*  PLT 529*  CREATININE 2.84*   Estimated Creatinine Clearance: 24.1 mL/min (by C-G formula based on Cr of 2.84). No results for input(s): VANCOTROUGH, VANCOPEAK, VANCORANDOM, GENTTROUGH, GENTPEAK, GENTRANDOM, TOBRATROUGH, TOBRAPEAK, TOBRARND, AMIKACINPEAK, AMIKACINTROU, AMIKACIN in the last 72 hours.   Microbiology: No results found for this or any previous visit (from the past 720 hour(s)).  Medical History: Past Medical History  Diagnosis Date  . Coronary artery disease   . Hypertension   . Hypercholesterolemia   . Stroke   . CML (chronic myelocytic leukemia)   . TIA (transient ischemic attack) 05/10/2014     Assessment: 69y.o. Male presents with SOB.  Has a history of CML and is currently on oral treatment.  States he has had fevers and diaphoresis that home.  Pharmacy consulted to dose vancomycin and zosyn for sepsis.  Scr 2.84, CrCl ~48mls/min (baseline Scr ~ 1.2)  Goal of Therapy:  Vancomycin trough level 15-20 mcg/ml  Zosyn and vancomycin per renal function  Plan:  Zosyn 3.375mg  IV q8h EI Vancomycin 750mg  IV q24h Follow renal function, cultures, clinical course vanc trough at steady state  Dolly Rias RPh 09/13/2014, 2:23 PM Pager [phone]

## 2014-09-13 NOTE — Progress Notes (Addendum)
Patient not able to void. Bladder scan was not impressive. Urine sample needed for lab testing. In and out cath without difficulty. Yielded 200 cc of clear dark amber urine. No odor noted. Will continue to monitor.

## 2014-09-13 NOTE — Consult Note (Signed)
Reason for Consult: Chest pain /minimal elevated troponin I  Referring Physician: [facility]  Bruce [name] is an 69 y.o. male.  HPI: Patient is 69 year old male with past medical history significant for mild coronary artery disease, nonischemic cardiomyopathy EF approximately 4045% in the past, hypertension, chronic myeloid leukemia, hypercholesteremia, history of CVA 2, alcohol abuse, tobacco abuse, anemia of chronic disease, history of thrombocytosis, Imdur ER by EMS complaining of cough with retrosternal chest pain associated with some left arm numbness and left facial numbness and dizziness associated with palpitation. EKG done in the ED showed marked sinus tachycardia with poor R-wave progression in anterior leads and minor ST-T wave changes in lateral leads and was noted to have minimally elevated troponin I. Patient also had markedly elevated LFTs and was noted to have acute renal injury and elevated PT-INR and lactic acid.  Past Medical History  Diagnosis Date  . Coronary artery disease   . Hypertension   . Hypercholesterolemia   . Stroke     No residual limb weakness.  Walks with cane at baseline.   Marland Kitchen CML (chronic myelocytic leukemia)   . TIA (transient ischemic attack) 05/10/2014  . Bell's palsy     Past Surgical History  Procedure Laterality Date  . Back surgery    . Hip arthroplasty Right     orif  . Orif forearm fracture Right     Family History  Problem Relation Age of Onset  . Diabetes Mother   . Hypertension Mother   . Diabetes Father   . Hypertension Father   . Diabetes Brother   . Hypertension Brother   . Diabetes Sister   . Hypertension Sister   . Diabetes Brother   . Hypertension Brother   . Diabetes Sister   . Hypertension Sister     Social History:  reports that he has quit smoking. His smoking use included Cigarettes. He has a 25 pack-year smoking history. He has never used smokeless tobacco. He reports that he drinks about 0.6 oz of  alcohol per week. He reports that he does not use illicit drugs.  Allergies: No Known Allergies  Medications: I have reviewed the patient's current medications.  Results for orders placed or performed during the hospital encounter of 09/13/14 (from the past 48 hour(s))  Basic metabolic panel  (if pt has PMH of COPD)     Status: Abnormal   Collection Time: 09/13/14 10:37 AM  Result Value Ref Range   Sodium 138 135 - 145 mmol/L    Comment: REPEATED TO VERIFY   Potassium 4.9 3.5 - 5.1 mmol/L    Comment: REPEATED TO VERIFY   Chloride 99 (L) 101 - 111 mmol/L    Comment: REPEATED TO VERIFY   CO2 15 (L) 22 - 32 mmol/L    Comment: REPEATED TO VERIFY   Glucose, Bld 138 (H) 65 - 99 mg/dL   BUN 49 (H) 6 - 20 mg/dL   Creatinine, Ser 2.84 (H) 0.61 - 1.24 mg/dL   Calcium 9.0 8.9 - 10.3 mg/dL    Comment: REPEATED TO VERIFY   GFR calc non Af Amer 21 (L) >60 mL/min   GFR calc Af Amer 25 (L) >60 mL/min    Comment: (NOTE) The eGFR has been calculated using the CKD EPI equation. This calculation has not been validated in all clinical situations. eGFR's persistently <60 mL/min signify possible Chronic Kidney Disease.    Anion gap 24 (H) 5 - 15    Comment: REPEATED TO VERIFY  CBC  (if pt has PMH of COPD)     Status: Abnormal   Collection Time: 09/13/14 10:37 AM  Result Value Ref Range   WBC 6.5 4.0 - 10.5 K/uL   RBC 4.26 4.22 - 5.81 MIL/uL   Hemoglobin 12.1 (L) 13.0 - 17.0 g/dL   HCT 38.1 (L) 39.0 - 52.0 %   MCV 89.4 78.0 - 100.0 fL   MCH 28.4 26.0 - 34.0 pg   MCHC 31.8 30.0 - 36.0 g/dL   RDW 16.4 (H) 11.5 - 15.5 %   Platelets 529 (H) 150 - 400 K/uL  Troponin I  (if patient has PMH of COPD)     Status: Abnormal   Collection Time: 09/13/14 10:37 AM  Result Value Ref Range   Troponin I 0.48 (H) <0.031 ng/mL    Comment:        PERSISTENTLY INCREASED TROPONIN VALUES IN THE RANGE OF 0.04-0.49 ng/mL CAN BE SEEN IN:       -UNSTABLE ANGINA       -CONGESTIVE HEART FAILURE       -MYOCARDITIS        -CHEST TRAUMA       -ARRYHTHMIAS       -LATE PRESENTING MYOCARDIAL INFARCTION       -COPD   CLINICAL FOLLOW-UP RECOMMENDED.   Hepatic function panel     Status: Abnormal   Collection Time: 09/13/14 10:37 AM  Result Value Ref Range   Total Protein 7.9 6.5 - 8.1 g/dL   Albumin 4.3 3.5 - 5.0 g/dL   AST 542 (H) 15 - 41 U/L   ALT 455 (H) 17 - 63 U/L   Alkaline Phosphatase 300 (H) 38 - 126 U/L   Total Bilirubin 4.3 (H) 0.3 - 1.2 mg/dL   Bilirubin, Direct 2.2 (H) 0.1 - 0.5 mg/dL   Indirect Bilirubin 2.1 (H) 0.3 - 0.9 mg/dL  Differential     Status: Abnormal   Collection Time: 09/13/14 10:37 AM  Result Value Ref Range   Neutrophils Relative % 69 43 - 77 %   Neutro Abs 4.3 1.7 - 7.7 K/uL   Lymphocytes Relative 16 12 - 46 %   Lymphs Abs 1.0 0.7 - 4.0 K/uL   Monocytes Relative 15 (H) 3 - 12 %   Monocytes Absolute 0.9 0.1 - 1.0 K/uL   Eosinophils Relative 0 0 - 5 %   Eosinophils Absolute 0.0 0.0 - 0.7 K/uL   Basophils Relative 0 0 - 1 %   Basophils Absolute 0.0 0.0 - 0.1 K/uL  I-Stat CG4 Lactic Acid, ED  (not at [facility])     Status: Abnormal   Collection Time: 09/13/14 10:44 AM  Result Value Ref Range   Lactic Acid, Venous 8.79 (HH) 0.5 - 2.0 mmol/L   Comment NOTIFIED PHYSICIAN   I-Stat CG4 Lactic Acid, ED     Status: Abnormal   Collection Time: 09/13/14  1:41 PM  Result Value Ref Range   Lactic Acid, Venous 6.82 (HH) 0.5 - 2.0 mmol/L   Comment NOTIFIED PHYSICIAN   Procalcitonin     Status: None   Collection Time: 09/13/14  2:28 PM  Result Value Ref Range   Procalcitonin 0.56 ng/mL    Comment:        Interpretation: PCT > 0.5 ng/mL and <= 2 ng/mL: Systemic infection (sepsis) is possible, but other conditions are known to elevate PCT as well. (NOTE)         ICU PCT Algorithm  Non ICU PCT Algorithm    ----------------------------     ------------------------------         PCT < 0.25 ng/mL                 PCT < 0.1 ng/mL     Stopping of antibiotics             Stopping of antibiotics       strongly encouraged.               strongly encouraged.    ----------------------------     ------------------------------       PCT level decrease by               PCT < 0.25 ng/mL       >= 80% from peak PCT       OR PCT 0.25 - 0.5 ng/mL          Stopping of antibiotics                                             encouraged.     Stopping of antibiotics           encouraged.    ----------------------------     ------------------------------       PCT level decrease by              PCT >= 0.25 ng/mL       < 80% from peak PCT        AND PCT >= 0.5 ng/mL             Continuing antibiotics                                              encouraged.       Continuing antibiotics            encouraged.    ----------------------------     ------------------------------     PCT level increase compared          PCT > 0.5 ng/mL         with peak PCT AND          PCT >= 0.5 ng/mL             Escalation of antibiotics                                          strongly encouraged.      Escalation of antibiotics        strongly encouraged.   Protime-INR     Status: Abnormal   Collection Time: 09/13/14  2:28 PM  Result Value Ref Range   Prothrombin Time 25.5 (H) 11.6 - 15.2 seconds   INR 2.35 (H) 0.00 - 1.49  APTT     Status: None   Collection Time: 09/13/14  2:28 PM  Result Value Ref Range   aPTT 32 24 - 37 seconds  Magnesium     Status: Abnormal   Collection Time: 09/13/14  2:51 PM  Result Value Ref Range   Magnesium 2.6 (H) 1.7 - 2.4 mg/dL    Ct Abdomen Pelvis  Wo Contrast  09/13/2014   CLINICAL DATA:  Sepsis.  EXAM: CT ABDOMEN AND PELVIS WITHOUT CONTRAST  TECHNIQUE: Multidetector CT imaging of the abdomen and pelvis was performed following the standard protocol without IV contrast.  COMPARISON:  CT scan of March 18, 2014.  FINDINGS: Visualized lung bases appear normal. No significant osseous abnormality is noted.  No gallstones are noted. No focal abnormality is  noted in the liver, spleen or pancreas on these unenhanced images. Adrenal glands and kidneys appear normal. No hydronephrosis or renal obstruction is noted. No renal or ureteral calculi are noted. The appendix appears normal. There is no evidence of bowel obstruction. Atherosclerosis of abdominal aorta is noted without aneurysm formation. Urinary bladder appears normal. No abnormal fluid collection is noted. No significant adenopathy is noted.  IMPRESSION: No acute abnormality seen in the abdomen or pelvis.   Electronically Signed   By: Marijo Conception, M.D.   On: 09/13/2014 16:17   Dg Chest 2 View (if Patient Has Fever And/or Copd)  09/13/2014   CLINICAL DATA:  Subsequent encounter for coughing congestion with mild Chest pain for couple of months.  EXAM: CHEST  2 VIEW  COMPARISON:  08/08/2014.  FINDINGS: The lungs are clear without focal infiltrate, edema, pneumothorax or pleural effusion. Cardiopericardial silhouette is at upper limits of normal for size. Imaged bony structures of the thorax are intact. Telemetry leads overlie the chest.  IMPRESSION: Upper normal to mildly enlarged heart.  Otherwise normal exam.   Electronically Signed   By: Misty Stanley M.D.   On: 09/13/2014 11:07    Review of Systems  Constitutional: Positive for malaise/fatigue and diaphoresis.  Eyes: Negative for double vision and photophobia.  Respiratory: Positive for cough and shortness of breath. Negative for hemoptysis.   Cardiovascular: Positive for chest pain. Negative for orthopnea, claudication and leg swelling.  Gastrointestinal: Positive for vomiting and abdominal pain.  Genitourinary: Negative for dysuria.  Neurological: Positive for dizziness and weakness. Negative for headaches.   Blood pressure 132/85, pulse 98, temperature 97.6 F (36.4 C), temperature source Oral, resp. rate 22, height _0  (1.727 m), weight 77.111 kg (170 lb), SpO2 95 %. Physical Exam  Constitutional: He is oriented to person, place, and  time.  HENT:  Head: Normocephalic and atraumatic.  Neck: Normal range of motion. Neck supple. No JVD present. No tracheal deviation present. No thyromegaly present.  Cardiovascular: Normal rate and regular rhythm.   Murmur (Soft systolic murmur noted) heard. Respiratory:  Decreased breath sound at bases  GI: Soft. Bowel sounds are normal. He exhibits no distension. There is no tenderness.  Musculoskeletal: He exhibits no edema or tenderness.  Neurological: He is alert and oriented to person, place, and time.    Assessment/Plan: Atypical chest pain with minimally elevated troponin I can't secondary to type II MI due to demand ischemia. Mild CAD in the past Nonischemic cardiomyopathy Hypertension Chronic myeloid leukemia Multivessel history in failure rule out sepsis/DIC Dehydration Acute renal injury History of CVA 2 in the past EtOH abuse Tobacco abuse Questionable status post TIA Plan Continue aspirin and Plavix and beta blockers Continue present management per CCM Check serial enzymes and EKG next line check 2-D echo Agree with slow hydration watch for volume overload Dr. Dr. Doylene Canard on call for me for weekend  [name] 09/13/2014, 4:27 PM

## 2014-09-13 NOTE — ED Notes (Signed)
Pt transported to MRI; MRI staff report pt will be in procedure for approx 30 minutes.

## 2014-09-14 ENCOUNTER — Inpatient Hospital Stay (HOSPITAL_COMMUNITY): Payer: Medicare Other

## 2014-09-14 DIAGNOSIS — G9340 Encephalopathy, unspecified: Secondary | ICD-10-CM

## 2014-09-14 DIAGNOSIS — IMO0001 Reserved for inherently not codable concepts without codable children: Secondary | ICD-10-CM | POA: Insufficient documentation

## 2014-09-14 DIAGNOSIS — R1010 Upper abdominal pain, unspecified: Secondary | ICD-10-CM

## 2014-09-14 DIAGNOSIS — A419 Sepsis, unspecified organism: Secondary | ICD-10-CM

## 2014-09-14 DIAGNOSIS — R1084 Generalized abdominal pain: Secondary | ICD-10-CM

## 2014-09-14 DIAGNOSIS — K7290 Hepatic failure, unspecified without coma: Secondary | ICD-10-CM

## 2014-09-14 LAB — COMPREHENSIVE METABOLIC PANEL
ALK PHOS: 262 U/L — AB (ref 38–126)
ALT: 1078 U/L — ABNORMAL HIGH (ref 17–63)
AST: 1596 U/L — AB (ref 15–41)
Albumin: 3.5 g/dL (ref 3.5–5.0)
Anion gap: 19 — ABNORMAL HIGH (ref 5–15)
BUN: 56 mg/dL — ABNORMAL HIGH (ref 6–20)
CO2: 14 mmol/L — ABNORMAL LOW (ref 22–32)
Calcium: 7.8 mg/dL — ABNORMAL LOW (ref 8.9–10.3)
Chloride: 104 mmol/L (ref 101–111)
Creatinine, Ser: 3.3 mg/dL — ABNORMAL HIGH (ref 0.61–1.24)
GFR calc Af Amer: 21 mL/min — ABNORMAL LOW (ref >60)
GFR, EST NON AFRICAN AMERICAN: 18 mL/min — AB (ref >60)
GLUCOSE: 115 mg/dL — AB (ref 65–99)
POTASSIUM: 5.6 mmol/L — AB (ref 3.5–5.1)
Sodium: 137 mmol/L (ref 135–145)
Total Bilirubin: 6.1 mg/dL — ABNORMAL HIGH (ref 0.3–1.2)
Total Protein: 6.7 g/dL (ref 6.5–8.1)

## 2014-09-14 LAB — HEPATITIS PANEL, ACUTE
HCV Ab: <0.1 s/co ratio — AB (ref 0.0–0.9)
Hep A IgM: NEGATIVE — AB
Hep B C IgM: NEGATIVE — AB
Hepatitis B Surface Ag: NEGATIVE — AB

## 2014-09-14 LAB — CBC WITH DIFFERENTIAL/PLATELET
Basophils Absolute: 0 10*3/uL (ref 0.0–0.1)
Basophils Relative: 0 % (ref 0–1)
EOS PCT: 0 % (ref 0–5)
Eosinophils Absolute: 0 10*3/uL (ref 0.0–0.7)
HEMATOCRIT: 35 % — AB (ref 39.0–52.0)
HEMOGLOBIN: 11 g/dL — AB (ref 13.0–17.0)
Lymphocytes Relative: 14 % (ref 12–46)
Lymphs Abs: 1.4 10*3/uL (ref 0.7–4.0)
MCH: 28.6 pg (ref 26.0–34.0)
MCHC: 31.4 g/dL (ref 30.0–36.0)
MCV: 90.9 fL (ref 78.0–100.0)
MONO ABS: 1.2 10*3/uL — AB (ref 0.1–1.0)
Monocytes Relative: 12 % (ref 3–12)
NEUTROS ABS: 7.4 10*3/uL (ref 1.7–7.7)
Neutrophils Relative %: 74 % (ref 43–77)
PLATELETS: 368 10*3/uL (ref 150–400)
RBC: 3.85 MIL/uL — AB (ref 4.22–5.81)
RDW: 16.7 % — AB (ref 11.5–15.5)
WBC: 10.1 10*3/uL (ref 4.0–10.5)

## 2014-09-14 LAB — ACETAMINOPHEN LEVEL

## 2014-09-14 LAB — MAGNESIUM: Magnesium: 2.4 mg/dL (ref 1.7–2.4)

## 2014-09-14 LAB — TROPONIN I
TROPONIN I: 0.47 ng/mL — AB (ref <0.031)
Troponin I: 0.49 ng/mL — ABNORMAL HIGH (ref <0.031)
Troponin I: 0.55 ng/mL (ref <0.031)

## 2014-09-14 LAB — LACTIC ACID, PLASMA
Lactic Acid, Venous: 5.4 mmol/L (ref 0.5–2.0)
Lactic Acid, Venous: 6.5 mmol/L (ref 0.5–2.0)

## 2014-09-14 LAB — PHOSPHORUS: Phosphorus: 5.3 mg/dL — ABNORMAL HIGH (ref 2.5–4.6)

## 2014-09-14 LAB — PROCALCITONIN: Procalcitonin: 1.11 ng/mL

## 2014-09-14 LAB — AMMONIA: Ammonia: 32 umol/L (ref 9–35)

## 2014-09-14 MED ORDER — SODIUM POLYSTYRENE SULFONATE 15 GM/60ML PO SUSP
15.0000 g | Freq: Once | ORAL | Status: AC
  Administered 2014-09-14: 15 g via ORAL
  Filled 2014-09-14: qty 60

## 2014-09-14 MED ORDER — PIPERACILLIN-TAZOBACTAM 3.375 G IVPB
3.3750 g | Freq: Three times a day (TID) | INTRAVENOUS | Status: DC
  Administered 2014-09-14 – 2014-09-15 (×3): 3.375 g via INTRAVENOUS
  Filled 2014-09-14 (×3): qty 50

## 2014-09-14 MED ORDER — LIP MEDEX EX OINT
TOPICAL_OINTMENT | CUTANEOUS | Status: AC
  Administered 2014-09-14: 21:00:00
  Filled 2014-09-14: qty 7

## 2014-09-14 MED ORDER — SODIUM CHLORIDE 0.9 % IV BOLUS (SEPSIS)
3000.0000 mL | Freq: Once | INTRAVENOUS | Status: AC
  Administered 2014-09-14: 3000 mL via INTRAVENOUS

## 2014-09-14 MED ORDER — VANCOMYCIN HCL IN DEXTROSE 750-5 MG/150ML-% IV SOLN
750.0000 mg | INTRAVENOUS | Status: DC
  Administered 2014-09-14: 750 mg via INTRAVENOUS
  Filled 2014-09-14: qty 150

## 2014-09-14 MED ORDER — PROMETHAZINE HCL 25 MG/ML IJ SOLN
6.2500 mg | Freq: Four times a day (QID) | INTRAMUSCULAR | Status: DC | PRN
  Administered 2014-09-14 – 2014-09-19 (×2): 6.25 mg via INTRAVENOUS
  Filled 2014-09-14 (×2): qty 1

## 2014-09-14 NOTE — Progress Notes (Signed)
VASCULAR LAB PRELIMINARY  PRELIMINARY  PRELIMINARY  PRELIMINARY  Carotid duplex completed.    Preliminary report:  Bilateral:  1-39% ICA stenosis.  Vertebral artery flow is antegrade.      Sheryle Vice, RVT 09/14/2014, 8:42 AM

## 2014-09-14 NOTE — Consult Note (Signed)
[facility] Consult: 10:51 AM 09/14/2014  LOS: 1 day    Referring Provider: Dr Lake Bells.  Primary Care Physician:  [name], MD Primary Gastroenterologist:  none     Reason for Consultation:  Elevated LFTs.    HPI: Bruce [name] is a 69 y.o. male.  Diagnosed with CML 02/2014.  initial treatment with Gleevac, but s/e forced switch to Desatinib. Non-ichemic CM, mild to moderate CAD on cath in 2012, hx TIAs. Left eye blindness. Pt is poor historian, constantly changing his story. Says he has had abdominal pain all over for 4 to 5 months, also says n/v for several months.  Not bloody or CG but may have had CG emesis this AM (reporting RN did not mention). Stools every few days, not bloody or melenic.  Poor appetite with 30# documented weight loss since late 07/2013.  LFTs late  07/2014 with alk phos 141. Ast/alt 64/87.  Normal t bili  Says he had colonoscopy in past but provides no details.   Says he quit all ETOh 5 months ago and denies hx previous ETOH excess/abuse but had psych admit 2006 for ETOH detox. Says he only took 2 tylenol yesterday, none before that.    Admitted yesterday with weakness, chest and abdominal pain. Tachy in ED.  Acute Kidney injury with rising bun/creat. Marland Kitchen  LFTs, alk phos markedly elevated and rising. t bil 4.3 to 6.1, alk phos 300 to 362, AST 542 to 1596, ALT 455 to1078. APAP level normal.  But he reports using tylenol   Lipase/amylase normal.  coags elevated to 25 and 2.3.  Hepatitis ABC serologies negative.  tox screen negative but no ETOH level obtained.  Several infectious disease tests pending.  Minimal elevation troponins  Non contrast CT ab/pelvis unremarkable.  Ultrasound ordered/pending.   Jehovah's witness so does not want blood  Products.     Past Medical  History  Diagnosis Date  . Coronary artery disease   . Hypertension   . Hypercholesterolemia   . Stroke     No residual limb weakness.  Walks with cane at baseline.   Marland Kitchen CML (chronic myelocytic leukemia)   . TIA (transient ischemic attack) 05/10/2014  . Bell's palsy     Past Surgical History  Procedure Laterality Date  . Back surgery    . Hip arthroplasty Right     orif  . Orif forearm fracture Right     Prior to Admission medications   Medication Sig Start Date End Date Taking? Authorizing Provider  albuterol (PROVENTIL HFA;VENTOLIN HFA) 108 (90 BASE) MCG/ACT inhaler Inhale 2 puffs into the lungs every 4 (four) hours as needed for wheezing or shortness of breath. 08/08/14  Yes Noemi Chapel, MD  ALPRAZolam Duanne Moron) 1 MG tablet Take 1 tablet (1 mg total) by mouth at bedtime as needed for anxiety. 07/31/14  Yes Truitt Merle, MD  atorvastatin (LIPITOR) 40 MG tablet Take 40 mg by mouth every morning.  03/02/14  Yes Historical Provider, MD  carvedilol (COREG) 3.125 MG tablet Take 3.125 mg by mouth 2 (two) times daily.  05/29/14  Yes Historical Provider, MD  clopidogrel (PLAVIX) 75 MG tablet Take 1 tablet (75 mg total) by mouth daily. 03/21/14  Yes [name], MD  dasatinib (SPRYCEL) 100 MG tablet Take 100 mg by mouth daily.   Yes Historical Provider, MD  furosemide (LASIX) 20 MG tablet Take 1 tablet (20 mg total) by mouth daily. 07/31/14  Yes Truitt Merle, MD  lisinopril (PRINIVIL,ZESTRIL) 20 MG tablet Take 20 mg by mouth daily. 03/02/14  Yes Historical Provider, MD  oxyCODONE-acetaminophen (PERCOCET/ROXICET) 5-325 MG per tablet Take 1 tablet by mouth 2 (two) times daily as needed for moderate pain or severe pain.  08/21/14  Yes Historical Provider, MD  potassium chloride SA (K-DUR,KLOR-CON) 20 MEQ tablet Take 1 tablet (20 mEq total) by mouth 2 (two) times daily. 07/31/14  Yes Truitt Merle, MD  benzonatate (TESSALON) 100 MG capsule Take 1 capsule (100 mg total) by mouth every 8 (eight) hours. Patient not  taking: Reported on 09/13/2014 08/08/14   Noemi Chapel, MD  imatinib (GLEEVEC) 400 MG tablet Take 1 tablet (400 mg total) by mouth daily. Take with meals and large glass of water.Caution:Chemotherapy. Patient not taking: Reported on 09/13/2014 03/27/14   Truitt Merle, MD  predniSONE (DELTASONE) 20 MG tablet Take 2 tablets (40 mg total) by mouth daily. Patient not taking: Reported on 09/13/2014 08/08/14   Noemi Chapel, MD    Scheduled Meds: . aspirin  81 mg Oral Daily  . carvedilol  3.125 mg Oral BID WC  . clopidogrel  75 mg Oral Daily  . dextromethorphan  15 mg Oral BID  . heparin  5,000 Units Subcutaneous 3 times per day  . LORazepam  0.5 mg Intravenous Once  . piperacillin-tazobactam (ZOSYN)  IV  3.375 g Intravenous Q8H  . sodium chloride  3 mL Intravenous Q12H  . vancomycin  750 mg Intravenous Q24H   Infusions: . sodium chloride 125 mL/hr at 09/14/14 0920   PRN Meds: albuterol, morphine injection, promethazine   Allergies as of 09/13/2014  . (No Known Allergies)    Family History  Problem Relation Age of Onset  . Diabetes Mother   . Hypertension Mother   . Diabetes Father   . Hypertension Father   . Diabetes Brother   . Hypertension Brother   . Diabetes Sister   . Hypertension Sister   . Diabetes Brother   . Hypertension Brother   . Diabetes Sister   . Hypertension Sister     History   Social History  . Marital Status: Widowed    Spouse Name: N/A  . Number of Children: 0  . Years of Education: N/A   Occupational History  . retired    Social History Main Topics  . Smoking status: Former Smoker -- 0.50 packs/day for 50 years    Types: Cigarettes  . Smokeless tobacco: Never Used  . Alcohol Use: 0.6 oz/week    1 Cans of beer per week     Comment: daily   . Drug Use: No  . Sexual Activity: Not on file   Other Topics Concern  . Not on file   Social History Narrative   Patient is right handed.   Patient drinks 1-2 cups daily.    REVIEW OF  SYSTEMS: Constitutional:  Per HPI.  + weakness ENT:  No nose bleeds Pulm:  No SOB or cough CV:  No palpitations, no LE edema.  GU:  No hematuria, no frequency GI:  Per HPI. Heme:  No issues with unusual bleeding or  bruising   Transfusions:  None ever Neuro:  No headaches, no peripheral tingling or numbness Derm:  No itching, no rash or sores.  Endocrine:  No sweats or chills.  No polyuria or dysuria Immunization:  Not queried Travel:  None beyond local counties in last few months.    PHYSICAL EXAM: Vital signs in last 24 hours: Filed Vitals:   09/14/14 0800  BP: 113/72  Pulse:   Temp: 97.4 F (36.3 C)  Resp: 31   Wt Readings from Last 3 Encounters:  09/13/14 164 lb 0.4 oz (74.4 kg)  08/08/14 196 lb (88.905 kg)  08/07/14 196 lb (88.905 kg)    General: thin, not acutely ill looking.  comfortable Head:  No asymmetry or swelling  Eyes:  No icterus or pallor Ears:  HOH  Nose:  No congestion or discharge Mouth:  Clear and moist. Teeth fair.  Neck:  No jvd or masses Lungs:  Clear bil.  No cough or dyspnea Heart: RRR Abdomen:  Soft, tender across upper abdomen.  Liver edge palpable about 8 cm from costal margin. .   Rectal: deferred   Musc/Skeltl: no joint deformity or swelling Extremities:  No CCE  Neurologic:  Oriented x 2.  No tremor, no limb weakness. His histrory keeps changing Skin:  No rash, sores Tattoos:  none Nodes:  No cervical adenopathy   Psych:  Cooperative, calm.   Intake/Output from previous day: 06/03 0701 - 06/04 0700 In: 1834.3 [I.V.:1734.3; IV Piggyback:100] Out: 201 [Urine:200; Stool:1] Intake/Output this shift: Total I/O In: 291.7 [I.V.:291.7] Out: 150 [Urine:150]  LAB RESULTS:  Recent Labs  09/13/14 1037 09/14/14 0525  WBC 6.5 10.1  HGB 12.1* 11.0*  HCT 38.1* 35.0*  PLT 529* 368   BMET Lab Results  Component Value Date   NA 137 09/14/2014   NA 138 09/13/2014   NA 137 08/08/2014   K 5.6* 09/14/2014   K 4.9 09/13/2014   K 3.8  08/08/2014   CL 104 09/14/2014   CL 99* 09/13/2014   CL 104 08/08/2014   CO2 14* 09/14/2014   CO2 15* 09/13/2014   CO2 23 08/08/2014   GLUCOSE 115* 09/14/2014   GLUCOSE 138* 09/13/2014   GLUCOSE 247* 08/08/2014   BUN 56* 09/14/2014   BUN 49* 09/13/2014   BUN 13 08/08/2014   CREATININE 3.30* 09/14/2014   CREATININE 2.84* 09/13/2014   CREATININE 1.26 08/08/2014   CALCIUM 7.8* 09/14/2014   CALCIUM 9.0 09/13/2014   CALCIUM 8.5 08/08/2014   LFT  Recent Labs  09/13/14 1037 09/13/14 1724 09/14/14 0525  PROT 7.9 6.9 6.7  ALBUMIN 4.3 3.7 3.5  AST 542* 852* 1596*  ALT 455* 636* 1078*  ALKPHOS 300* 271* 262*  BILITOT 4.3* 4.8* 6.1*  BILIDIR 2.2* 2.7*  --   IBILI 2.1* 2.1*  --    PT/INR Lab Results  Component Value Date   INR 2.35* 09/13/2014   INR 1.05 03/20/2014   Hepatitis Panel  Recent Labs  09/13/14 1332  HEPBSAG Negative*  HCVAB <0.1*  HEPAIGM Negative*  HEPBIGM Negative*   C-Diff No components found for: CDIFF Lipase     Component Value Date/Time   LIPASE 24 09/13/2014 1554    Drugs of Abuse     Component Value Date/Time   LABOPIA NONE DETECTED 09/13/2014 1909   COCAINSCRNUR NONE DETECTED 09/13/2014 1909   LABBENZ NONE DETECTED 09/13/2014 1909   AMPHETMU NONE DETECTED 09/13/2014 1909   THCU NONE DETECTED 09/13/2014 1909   LABBARB NONE DETECTED 09/13/2014  1909     RADIOLOGY STUDIES: Ct Abdomen Pelvis Wo Contrast  09/13/2014   CLINICAL DATA:  Sepsis.  EXAM: CT ABDOMEN AND PELVIS WITHOUT CONTRAST  TECHNIQUE: Multidetector CT imaging of the abdomen and pelvis was performed following the standard protocol without IV contrast.  COMPARISON:  CT scan of March 18, 2014.  FINDINGS: Visualized lung bases appear normal. No significant osseous abnormality is noted.  No gallstones are noted. No focal abnormality is noted in the liver, spleen or pancreas on these unenhanced images. Adrenal glands and kidneys appear normal. No hydronephrosis or renal obstruction  is noted. No renal or ureteral calculi are noted. The appendix appears normal. There is no evidence of bowel obstruction. Atherosclerosis of abdominal aorta is noted without aneurysm formation. Urinary bladder appears normal. No abnormal fluid collection is noted. No significant adenopathy is noted.  IMPRESSION: No acute abnormality seen in the abdomen or pelvis.   Electronically Signed   By: Marijo Conception, M.D.   On: 09/13/2014 16:17   Dg Chest 2 View (if Patient Has Fever And/or Copd)  09/13/2014   CLINICAL DATA:  Subsequent encounter for coughing congestion with mild Chest pain for couple of months.  EXAM: CHEST  2 VIEW  COMPARISON:  08/08/2014.  FINDINGS: The lungs are clear without focal infiltrate, edema, pneumothorax or pleural effusion. Cardiopericardial silhouette is at upper limits of normal for size. Imaged bony structures of the thorax are intact. Telemetry leads overlie the chest.  IMPRESSION: Upper normal to mildly enlarged heart.  Otherwise normal exam.   Electronically Signed   By: Misty Stanley M.D.   On: 09/13/2014 11:07    ENDOSCOPIC STUDIES: Colonoscopy per pt but no path or colonoscopy reports found in epic.   IMPRESSION:   *  Marked elevation of LFTs with coagulopathy.  Not able to get hx of excessive tylenol and APAP level normal.  Ct unrevealing but I perceive hepatomegaly on exam.  ultrsound pending ? Drug toxicity, ? Ischemia/hypoperfusion?  Pt denies etoh use and no etoh level obtained.   *  CML on dacatenib.  *  Chronic plavix. Not on hold    PLAN:     * stop the plavix since he is coagulopathic.   *  Stop abx?  See no clear need for these  *  Labs.  Follow coags and cmet.   *  Await ultrasound.     Azucena Freed  09/14/2014, 10:51 AM Pager: [phone]  Attending MD note:   I have taken a history, examined the patient, and reviewed the chart. I agree with the Advanced Practitioner's impression and recommendations. I suspect an acute hepatocellular injury  either from meds or a vascular event.There is no evidence of underlying chronic liver disease. At his point we will follow daily  Synthetic liver function as ordered.  Sono pending.Would hold off on liver biopsies unless synthetic function continues to deteriorate.  [facility] Pager # [phone]

## 2014-09-14 NOTE — Progress Notes (Signed)
Pharmacy notified to D/C vancomycin per MD order.

## 2014-09-14 NOTE — Consult Note (Addendum)
Renal Service Consult Note [facility]  MYLEN MANGAN 09/14/2014 Sol Blazing Requesting Physician:  Dr Lake Bells  Reason for Consult:  Acute renal failure HPI: The patient is a 69 y.o. year-old with hx past hx of etoh, tobacoo, CAD, NICM 40% diagnosed with CML Dec 2015. Treated w [facility] w good response but stopped d/t side effects. Now on Desatinib. Came to eD tachycardica, tachypneic, creat 2.8. Marked elevation of lactic acid 8.79. CXR negatiev and UA w/o signs of infection. Admitted and given IV vanc/ zosyn. Abd pain on admission is better.   Patient denies any n/v/d currently .  May have been vomiting at home, but vague historian at this time. No CP, +abd pain, no diarrhea now.    Chart review: 3/06 - 26 yo etoh abuse w AMS, taking Klonopin admitted to [facility] for suspected OD.  11/15 - atypical CP r/o for MI, HTN, tobacco, etoh, ^^ wbc, thrombocytosis r/o leukemia. Acute bronchitis. WBC 44k, plts 775k.  Pt left AMA before BMbx and before being seen by hem-onc.  12/15 - seen in office by [facility], dx was likely CML. Hx of CVA x 2. BM + for CML. Chromosome 9/22 translocation +.  No blasts, chronic disease. Started on imitanib ([place]), a kinase inhibitor.   Jan '16 - followed up and improving with dec'd blood cts on Gleevec  Feb' 16 - body pain, insomnia, bad dreams and constipation, blood in stool , pt stopped taking Gleevec. Sx's improved. ONC said to decrease dose 400 > 200 /d.  Mar '16 > tried lower dose 50% and had severe side effects.  Apr ' 16 > tolerating Gleevec at 200 mg qod. WBC bounced up to 21k.  [employer] stopped and started on Desatinib. Leg edema , started on lasix.    Past Medical History  Past Medical History  Diagnosis Date  . Coronary artery disease   . Hypertension   . Hypercholesterolemia   . Stroke     No residual limb weakness.  Walks with cane at baseline.   Marland Kitchen CML (chronic myelocytic leukemia)   . TIA (transient ischemic attack) 05/10/2014  .  Bell's palsy    Past Surgical History  Past Surgical History  Procedure Laterality Date  . Back surgery    . Hip arthroplasty Right     orif  . Orif forearm fracture Right    Family History  Family History  Problem Relation Age of Onset  . Diabetes Mother   . Hypertension Mother   . Diabetes Father   . Hypertension Father   . Diabetes Brother   . Hypertension Brother   . Diabetes Sister   . Hypertension Sister   . Diabetes Brother   . Hypertension Brother   . Diabetes Sister   . Hypertension Sister    Social History  reports that he has quit smoking. His smoking use included Cigarettes. He has a 25 pack-year smoking history. He has never used smokeless tobacco. He reports that he drinks about 0.6 oz of alcohol per week. He reports that he does not use illicit drugs. Allergies No Known Allergies Home medications Prior to Admission medications   Medication Sig Start Date End Date Taking? Authorizing Provider  albuterol (PROVENTIL HFA;VENTOLIN HFA) 108 (90 BASE) MCG/ACT inhaler Inhale 2 puffs into the lungs every 4 (four) hours as needed for wheezing or shortness of breath. 08/08/14  Yes Noemi Chapel, MD  ALPRAZolam Duanne Moron) 1 MG tablet Take 1 tablet (1 mg total) by mouth at bedtime as  needed for anxiety. 07/31/14  Yes Truitt Merle, MD  atorvastatin (LIPITOR) 40 MG tablet Take 40 mg by mouth every morning.  03/02/14  Yes Historical Provider, MD  carvedilol (COREG) 3.125 MG tablet Take 3.125 mg by mouth 2 (two) times daily. 05/29/14  Yes Historical Provider, MD  clopidogrel (PLAVIX) 75 MG tablet Take 1 tablet (75 mg total) by mouth daily. 03/21/14  Yes [name], MD  dasatinib (SPRYCEL) 100 MG tablet Take 100 mg by mouth daily.   Yes Historical Provider, MD  furosemide (LASIX) 20 MG tablet Take 1 tablet (20 mg total) by mouth daily. 07/31/14  Yes Truitt Merle, MD  lisinopril (PRINIVIL,ZESTRIL) 20 MG tablet Take 20 mg by mouth daily. 03/02/14  Yes Historical Provider, MD   oxyCODONE-acetaminophen (PERCOCET/ROXICET) 5-325 MG per tablet Take 1 tablet by mouth 2 (two) times daily as needed for moderate pain or severe pain.  08/21/14  Yes Historical Provider, MD  potassium chloride SA (K-DUR,KLOR-CON) 20 MEQ tablet Take 1 tablet (20 mEq total) by mouth 2 (two) times daily. 07/31/14  Yes Truitt Merle, MD  benzonatate (TESSALON) 100 MG capsule Take 1 capsule (100 mg total) by mouth every 8 (eight) hours. Patient not taking: Reported on 09/13/2014 08/08/14   Noemi Chapel, MD  imatinib (GLEEVEC) 400 MG tablet Take 1 tablet (400 mg total) by mouth daily. Take with meals and large glass of water.Caution:Chemotherapy. Patient not taking: Reported on 09/13/2014 03/27/14   Truitt Merle, MD  predniSONE (DELTASONE) 20 MG tablet Take 2 tablets (40 mg total) by mouth daily. Patient not taking: Reported on 09/13/2014 08/08/14   Noemi Chapel, MD   Liver Function Tests  Recent Labs Lab 09/13/14 1037 09/13/14 1724 09/14/14 0525  AST 542* 852* 1596*  ALT 455* 636* 1078*  ALKPHOS 300* 271* 262*  BILITOT 4.3* 4.8* 6.1*  PROT 7.9 6.9 6.7  ALBUMIN 4.3 3.7 3.5    Recent Labs Lab 09/13/14 1554  LIPASE 24  AMYLASE 59   CBC  Recent Labs Lab 09/13/14 1037 09/14/14 0525  WBC 6.5 10.1  NEUTROABS 4.3 7.4  HGB 12.1* 11.0*  HCT 38.1* 35.0*  MCV 89.4 90.9  PLT 529* 045   Basic Metabolic Panel  Recent Labs Lab 09/13/14 1037 09/14/14 0525  NA 138 137  K 4.9 5.6*  CL 99* 104  CO2 15* 14*  GLUCOSE 138* 115*  BUN 49* 56*  CREATININE 2.84* 3.30*  CALCIUM 9.0 7.8*  PHOS  --  5.3*    Filed Vitals:   09/14/14 0000 09/14/14 0400 09/14/14 0413 09/14/14 0800  BP: 122/81 106/71  113/72  Pulse: 91 86    Temp:   97.8 F (36.6 C) 97.4 F (36.3 C)  TempSrc:   Oral Oral  Resp: _0 Height:      Weight:      SpO2: 100% 96%  100%   Exam Alert, calm, no distress, dry mouth and lips No rash, cyanosis or gangrene Sclera anicteric, throat clear Flat neck veins Chest clear  bilat RRR no MRG, tachy, soft HD abd soft, mild LUQ tenderness, +BS, no ascites GU normal No LE or UE edema No wounds, ulcers or rash No joint changes Neuro is alert, Ox 3, nf  UA cloudy, orange, prot 100, pH 5, 1.026 UNa 11, Ucreat 325, FeNa 0.074 % CXR clear AST 1596, ALT 1078, alb 3.5, bili 6.1, TP 6.7 Ca 7.8 adj 8.3, glu 115, BUN 56 , Creat 3.30,  Na 137, K 5.6, CO2 14  wBC 10, Hb 11, plt 368 Home meds > albuterol, statin, xanax, coreg, plavix, dasatinib, lisinopril, lasix, percocet, KCl, prednisone Assessment: 1. Renal failure, acute - severely volume depleted. Suspect vol depletion/ ACEi causing AKI.  Still many quarts low on fluid.  Will increase IVF's. He is oliguric.  FeNa was 0.07%.  2. ^LFT's / jaundice - unclear cause 3. CML on dasatinib 4. Prior etoh 5. Prior tobacco 6. HTN 7.  HL 8.  H/o CVA   Plan- IVF"s. Repeat labs in am. DC vanc.   Kelly Splinter MD (pgr) [phone]    [phone] 09/14/2014, 3:26 PM

## 2014-09-14 NOTE — Progress Notes (Addendum)
CRITICAL VALUE ALERT  Critical value received: Traponin 0.55; Lactic Acid 6.5  Date of notification: 09/14/14  Time of notification: 1204  Critical value read back:Yes.    Nurse who received alert: Lenoria Farrier, RN  MD notified (1st page):  McQuaid   Time of first page:  1203  MD notified (2nd page):  Time of second page:  Responding MD:  Lake Bells  Time MD responded: [id number]  No new orders

## 2014-09-14 NOTE — Progress Notes (Signed)
CRITICAL VALUE ALERT  Critical value received: Lactic Acid 5.4  Date of notification:  09/14/14  Time of notification:  0932  Critical value read back:Yes.    Nurse who received alert:  Lenoria Farrier, RN  MD notified (1st page): Dr. Clementeen Graham  Time of first page:  [phone]  MD notified (2nd page): McQuaid at Bedside. He states he is assuming care so Dr. Clementeen Graham not re-paged.  Time of second page:[phone]  Responding MD:  Lake Bells   Time MD responded:  [id number]

## 2014-09-14 NOTE — Progress Notes (Signed)
Ref: [name], MD   Subjective:  Feeling better. No chest pain. Afebrile.  Objective:  Vital Signs in the last 24 hours: Temp:  [97.4 F (36.3 C)-98.1 F (36.7 C)] 97.4 F (36.3 C) (06/04 0800) Pulse Rate:  [86-128] 86 (06/04 0400) Cardiac Rhythm:  [-] Normal sinus rhythm (06/04 0815) Resp:  [18-34] 31 (06/04 0800) BP: (105-144)/(65-97) 113/72 mmHg (06/04 0800) SpO2:  [82 %-100 %] 100 % (06/04 0800) Weight:  [74.4 kg (164 lb 0.4 oz)] 74.4 kg (164 lb 0.4 oz) (06/03 1700)  Physical Exam: BP Readings from Last 1 Encounters:  09/14/14 113/72    Wt Readings from Last 1 Encounters:  09/13/14 74.4 kg (164 lb 0.4 oz)    Weight change:   HEENT: [place]/AT, Eyes-Brown, PERL, EOMI, Conjunctiva-Pink, Sclera-Non-icteric Neck: No JVD, No bruit, Trachea midline. Lungs:  Clear, Bilateral. Cardiac:  Regular rhythm, normal S1 and S2, no S3.  Abdomen:  Soft, non-tender. Extremities:  No edema present. No cyanosis. No clubbing. CNS: AxOx3, Cranial nerves grossly intact, moves all 4 extremities. Right handed. Skin: Warm and dry.   Intake/Output from previous day: 06/03 0701 - 06/04 0700 In: 1834.3 [I.V.:1734.3; IV Piggyback:100] Out: 201 [Urine:200; Stool:1]    Lab Results: BMET    Component Value Date/Time   NA 137 09/14/2014 0525   NA 138 09/13/2014 1037   NA 137 08/08/2014 1947   NA 142 07/31/2014 1244   NA 142 06/26/2014 1008   NA 140 05/13/2014 0937   K 5.6* 09/14/2014 0525   K 4.9 09/13/2014 1037   K 3.8 08/08/2014 1947   K 4.3 07/31/2014 1244   K 4.1 06/26/2014 1008   K 3.5 05/13/2014 0937   CL 104 09/14/2014 0525   CL 99* 09/13/2014 1037   CL 104 08/08/2014 1947   CO2 14* 09/14/2014 0525   CO2 15* 09/13/2014 1037   CO2 23 08/08/2014 1947   CO2 22 07/31/2014 1244   CO2 25 06/26/2014 1008   CO2 22 05/13/2014 0937   GLUCOSE 115* 09/14/2014 0525   GLUCOSE 138* 09/13/2014 1037   GLUCOSE 247* 08/08/2014 1947   GLUCOSE 133 07/31/2014 1244   GLUCOSE 174* 06/26/2014  1008   GLUCOSE 210* 05/13/2014 0937   BUN 56* 09/14/2014 0525   BUN 49* 09/13/2014 1037   BUN 13 08/08/2014 1947   BUN 18.3 07/31/2014 1244   BUN 8.3 06/26/2014 1008   BUN 9.1 05/13/2014 0937   CREATININE 3.30* 09/14/2014 0525   CREATININE 2.84* 09/13/2014 1037   CREATININE 1.26 08/08/2014 1947   CREATININE 1.2 07/31/2014 1244   CREATININE 1.1 06/26/2014 1008   CREATININE 1.0 05/13/2014 0937   CALCIUM 7.8* 09/14/2014 0525   CALCIUM 9.0 09/13/2014 1037   CALCIUM 8.5 08/08/2014 1947   CALCIUM 9.2 07/31/2014 1244   CALCIUM 9.0 06/26/2014 1008   CALCIUM 8.9 05/13/2014 0937   GFRNONAA 18* 09/14/2014 0525   GFRNONAA 21* 09/13/2014 1037   GFRNONAA 57* 08/08/2014 1947   GFRAA 21* 09/14/2014 0525   GFRAA 25* 09/13/2014 1037   GFRAA 66* 08/08/2014 1947   CBC    Component Value Date/Time   WBC 10.1 09/14/2014 0525   WBC 21.3* 07/31/2014 1244   RBC 3.85* 09/14/2014 0525   RBC 3.58* 07/31/2014 1244   HGB 11.0* 09/14/2014 0525   HGB 10.3* 07/31/2014 1244   HCT 35.0* 09/14/2014 0525   HCT 32.9* 07/31/2014 1244   PLT 368 09/14/2014 0525   PLT 831* 07/31/2014 1244   MCV 90.9 09/14/2014 0525  MCV 91.9 07/31/2014 1244   MCH 28.6 09/14/2014 0525   MCH 28.9 07/31/2014 1244   MCHC 31.4 09/14/2014 0525   MCHC 31.4* 07/31/2014 1244   RDW 16.7* 09/14/2014 0525   RDW 17.3* 07/31/2014 1244   LYMPHSABS 1.4 09/14/2014 0525   LYMPHSABS 3.6* 07/31/2014 1244   MONOABS 1.2* 09/14/2014 0525   MONOABS 1.2* 07/31/2014 1244   EOSABS 0.0 09/14/2014 0525   EOSABS 0.4 07/31/2014 1244   BASOSABS 0.0 09/14/2014 0525   BASOSABS 0.0 07/31/2014 1244   HEPATIC Function Panel  Recent Labs  09/13/14 1037 09/13/14 1724 09/14/14 0525  PROT 7.9 6.9 6.7   HEMOGLOBIN A1C No components found for: HGA1C,  MPG CARDIAC ENZYMES Lab Results  Component Value Date   CKTOTAL 165 09/13/2014   CKMB 2.4 12/17/2010   TROPONINI 0.47* 09/14/2014   TROPONINI 0.49* 09/13/2014   TROPONINI 0.48* 09/13/2014    BNP  Recent Labs  03/05/14 1515  PROBNP 376.5*   TSH No results for input(s): TSH in the last 8760 hours. CHOLESTEROL  Recent Labs  03/06/14 0306  CHOL 212*    Scheduled Meds: . aspirin  81 mg Oral Daily  . carvedilol  3.125 mg Oral BID WC  . clopidogrel  75 mg Oral Daily  . dextromethorphan  15 mg Oral BID  . heparin  5,000 Units Subcutaneous 3 times per day  . LORazepam  0.5 mg Intravenous Once  . piperacillin-tazobactam (ZOSYN)  IV  3.375 g Intravenous Q8H  . sodium chloride  3 mL Intravenous Q12H  . vancomycin  750 mg Intravenous Q24H   Continuous Infusions: . sodium chloride 125 mL/hr at 09/14/14 0920   PRN Meds:.albuterol, morphine injection, promethazine  Assessment/Plan: Minimally elevated troponin I secondary to renal dysfunction. Mild CAD in the past Nonischemic cardiomyopathy Hypertension Chronic myeloid leukemia Multiorgan failure rule out sepsis/DIC Dehydration Acute renal injury History of CVA 2 in the past EtOH abuse Tobacco abuse Questionable status post TIA  Continue medical treatment. Check echo report.     LOS: 1 day    Dixie Dials  MD  09/14/2014, 11:18 AM

## 2014-09-14 NOTE — Progress Notes (Signed)
PULMONARY / CRITICAL CARE MEDICINE   Name: Bruce [name] MRN: [id number] DOB: 1946-01-07    ADMISSION DATE:  09/13/2014 CONSULTATION DATE:  09/13/14  REFERRING MD :  Dr. Clementeen Graham   CHIEF COMPLAINT:  Weakness   INITIAL PRESENTATION: 69 y/o M, Jehovah's Witness, smoker / ETOH use, with PMH of CML who presented to [facility] ER with reports of worsening SOB.  He was found to have AKI, mild tachycardia and elevated lactic acid.  PCCM consulted for evaluation.    STUDIES:  6/03  CT ABD >> no acute intra-abdominal pathology  SIGNIFICANT EVENTS: 6/03  Admit per [facility] with worsening SOB, AKI, mild tachycardia and elevated lactic acid   SUBJECTIVE:  Worsening liver and renal failure, complains of mild abdominal pain, had loose stools this morning  VITAL SIGNS: Temp:  [97.4 F (36.3 C)-98.1 F (36.7 C)] 97.4 F (36.3 C) (06/04 0800) Pulse Rate:  [86-128] 86 (06/04 0400) Resp:  [16-34] 30 (06/04 0400) BP: (105-144)/(65-97) 106/71 mmHg (06/04 0400) SpO2:  [82 %-100 %] 96 % (06/04 0400) Weight:  [74.4 kg (164 lb 0.4 oz)-77.111 kg (170 lb)] 74.4 kg (164 lb 0.4 oz) (06/03 1700)   HEMODYNAMICS:     VENTILATOR SETTINGS:     INTAKE / OUTPUT:  Intake/Output Summary (Last 24 hours) at 09/14/14 0957 Last data filed at 09/14/14 0700  Gross per 24 hour  Intake 1834.25 ml  Output    201 ml  Net 1633.25 ml    PHYSICAL EXAMINATION:  Gen: mildly ill appearing in bed HENT: OP clear, TM's clear, neck supple Eyes: no suffusion or icterus PULM: CTA B, normal percussion CV: RRR, no mgr, trace edema GI: BS+, soft, mildly tender RUQ, no guarding or rebound tenderness Derm: no cyanosis or rash Psyche: normal mood and affect   LABS:  CBC  Recent Labs Lab 09/13/14 1037 09/14/14 0525  WBC 6.5 10.1  HGB 12.1* 11.0*  HCT 38.1* 35.0*  PLT 529* 368   Coag's  Recent Labs Lab 09/13/14 1428  APTT 32  INR 2.35*     BMET  Recent Labs Lab 09/13/14 1037 09/14/14 0525  NA 138 137  K 4.9  5.6*  CL 99* 104  CO2 15* 14*  BUN 49* 56*  CREATININE 2.84* 3.30*  GLUCOSE 138* 115*   Electrolytes  Recent Labs Lab 09/13/14 1037 09/13/14 1451 09/14/14 0525  CALCIUM 9.0  --  7.8*  MG  --  2.6* 2.4  PHOS  --   --  5.3*   Sepsis Markers  Recent Labs Lab 09/13/14 1044 09/13/14 1341 09/13/14 1428 09/13/14 1554 09/14/14 0816  LATICACIDVEN 8.79* 6.82*  --   --  5.4*  PROCALCITON  --   --  0.56 0.56  --    ABG No results for input(s): PHART, PCO2ART, PO2ART in the last 168 hours.   Liver Enzymes  Recent Labs Lab 09/13/14 1037 09/13/14 1724 09/14/14 0525  AST 542* 852* 1596*  ALT 455* 636* 1078*  ALKPHOS 300* 271* 262*  BILITOT 4.3* 4.8* 6.1*  ALBUMIN 4.3 3.7 3.5   Cardiac Enzymes  Recent Labs Lab 09/13/14 1724 09/13/14 2300 09/14/14 0525  TROPONINI 0.48* 0.49* 0.47*   Glucose No results for input(s): GLUCAP in the last 168 hours.  Imaging 6/3 CXR > mild LVH, no pulmonary infiltrate,    ASSESSMENT / PLAN:  PULMONARY OETT A: Dyspnea - resolved Cough - recent bronchitis with admission  P:   Pulmonary hygiene:  IS, mobilize as able Oxygen as needed  to support sats > 90%  Delsym for cough   CARDIOVASCULAR CVL A:  Chest Pain > resolved Elevated Troponin - demand ischemia most likely, but no EKG changes, suspect elevated Cr related  Hx HTN, HLD, CAD, NICM (EF 40%) P:  Continue to hold home lasix, lisinopril Continue home plavix, coreg for now Tele monitoring  F/U ECHO Continue Gentle hydration   RENAL A:   Anion Gap Metabolic Acidosis / Lactic Acidosis - lactic acid not clearing due to liver injury and AKI, he is not in shock;  AKI> non-oliguric, etiology uncertain, was never in shock, was not taking NSAIDS by history P:   Monitor BMET and UOP Replace electrolytes as needed Renal consult today Renal dose medications  GASTROINTESTINAL A:   Abdominal Pain with Elevated Transaminitis and liver failure - ddx broad, no clear source,  CT abdomen benign, APAP negative, Acute viral hepatitis panel negative, consider infectious (viral, rickettsial, leptospiro, others), Sprycel toxicity, vascular> hepatic vein thrombus?  P:   Trend LFT's  PT/INR now RUQ ultrasound with doppler NPO for now  GI consult today Send infectious serologies  HEMATOLOGIC A:   CML - followed by Dr. 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    - Inter-disciplinary family meet or Palliative Care meeting due by:  6/10   GLOBAL:  Liver and renal failure worse today, cause still uncertain, getting a history is difficult, but he doesn't appear septic to me.  Will broaden search for vascular or infectious causes of liver injury, maintain close monitoring in SDU.  PCCM to assume care.  Will f/u recs from renal and hepatology.  Roselie Awkward, MD [place] [facility] Pager: [phone] Cell: [phone] After 3pm or if no response, call [phone]   09/14/2014, 9:57 AM

## 2014-09-14 NOTE — Progress Notes (Addendum)
[facility] PROGRESS NOTE  Bruce [name] NOB:[id number] DOB: Jun 05, 1945 DOA: 09/13/2014 PCP: [name], MD  Assessment/Plan: Sepsis with multiorgan failure ( hepatic/ renal) No clear source. On desatinib for CML which could potentially cause this. CT abd unremarkable.  Lactic acid still elevated but slowly improved. ? Viral hepatitis, ischemic colitis Worsened LFTs. Normal tylenol level. Check ammonia, elevated INR. GI consulted Dr . Olevia Perches to see. Check RUQ [place]. continue empiric abx. Follow cx  Acute worsening transaminitis LFTs further deranged today.  On desatinib fr CML which could possibly contribute Avoid hepatotoxic agents. GI consult pending. CPK normal. Tylenol level negative.    Chest pain with elevated troponin Appears to be atypical and resolved currently . Possibly demand ischemia. Has history of cardiomyopathy. Will cycle serial enzymes. Patient asymptomatic at this time. Will not start on anticoagulation this time. Will order 2-D echo. Dr Terrence Dupont will see the patient.  Acute kidney injury  prerenal possibly associated with sepsis . Fe Na <1. UA neg for infection.  Poor urine output overnight . Reportedly only 200cc. Strict I/O.  Hold Lasix and ACE inhibitor. Renal consulted ( Dr Jonnie Finner)  Metabolic/ lactic acidosis Secondary to sepsis . Kayexalate ordered for hyperkalemia. monitor with IV resuscitation.   ? TIA With hx of CVA  History is unclear. Was seen by Dr Jannifer Franklin earlier this year with plan on MRI brain. MRA hada d carotid doppler. Reports dizzy spell and slurred speech this am which has mostly resolved now. ORDERED  MRI brain, MRA head. -Check 2D echo, carotid doppler without significant stenosis. -continue plavix. Hold statin.   CAD with hx of cardiomyopathy Compensated. Monitor with  IV resuscitation. Follow 2D echo  etoh abuse reports drinking some etoh 1-2 times a week. Reports quitting smoking.  Prolonged QTc avoid Qt prolonging  aqgents   Diet: NPO   DVT prophylaxis: SCD   Code Status: full code Family Communication: None at bedside Disposition Plan: admit to stepdown   Appreciate PCCM for taking over given medical complexity.   Consultants:  PCCM   renal   GI    Procedures:  CT abd     Antibiotics:  IV vanco and zosyn 6/3  HPI/Subjective: Still has some abd pain off and on. Loose tool this am and episode of vomiting. Afebrile and BP astable  Objective: Filed Vitals:   09/14/14 0800  BP: 113/72  Pulse:   Temp: 97.4 F (36.3 C)  Resp: 31    Intake/Output Summary (Last 24 hours) at 09/14/14 1055 Last data filed at 09/14/14 0920  Gross per 24 hour  Intake 2125.92 ml  Output    351 ml  Net 1774.92 ml   Filed Weights   09/13/14 1030 09/13/14 1700  Weight: 77.111 kg (170 lb) 74.4 kg (164 lb 0.4 oz)    Exam:   General:  NAD   HEENT: pallor+, icteric, moist mucosa  Cardiovascular: NS1&S2, no murmurs, rubs or gallop  Respiratory: clear b/l, no added sounds  Abdomen: soft, ND, mild periumbilical tenderness, BS+  Musculoskeletal: Warm, no edema   CNS: AAOX3, non focal   Data Reviewed: Basic Metabolic Panel:  Recent Labs Lab 09/13/14 1037 09/13/14 1451 09/14/14 0525  NA 138  --  137  K 4.9  --  5.6*  CL 99*  --  104  CO2 15*  --  14*  GLUCOSE 138*  --  115*  BUN 49*  --  56*  CREATININE 2.84*  --  3.30*  CALCIUM 9.0  --  7.8*  MG  --  2.6* 2.4  PHOS  --   --  5.3*   Liver Function Tests:  Recent Labs Lab 09/13/14 1037 09/13/14 1724 09/14/14 0525  AST 542* 852* 1596*  ALT 455* 636* 1078*  ALKPHOS 300* 271* 262*  BILITOT 4.3* 4.8* 6.1*  PROT 7.9 6.9 6.7  ALBUMIN 4.3 3.7 3.5    Recent Labs Lab 09/13/14 1554  LIPASE 24  AMYLASE 59   No results for input(s): AMMONIA in the last 168 hours. CBC:  Recent Labs Lab 09/13/14 1037 09/14/14 0525  WBC 6.5 10.1  NEUTROABS 4.3 7.4  HGB 12.1* 11.0*  HCT 38.1* 35.0*  MCV 89.4 90.9  PLT 529*  368   Cardiac Enzymes:  Recent Labs Lab 09/13/14 1037 09/13/14 1724 09/13/14 2300 09/14/14 0525  CKTOTAL  --  165  --   --   TROPONINI 0.48* 0.48* 0.49* 0.47*   BNP (last 3 results) No results for input(s): BNP in the last 8760 hours.  ProBNP (last 3 results)  Recent Labs  03/05/14 1515  PROBNP 376.5*    CBG: No results for input(s): GLUCAP in the last 168 hours.  Recent Results (from the past 240 hour(s))  Culture, blood (routine x 2)     Status: None (Preliminary result)   Collection Time: 09/13/14 10:34 AM  Result Value Ref Range Status   Specimen Description BLOOD LEFT ANTECUBITAL  Final   Special Requests BOTTLES DRAWN AEROBIC AND ANAEROBIC 3ML  Final   Culture   Final           BLOOD CULTURE RECEIVED NO GROWTH TO DATE CULTURE WILL BE HELD FOR 5 DAYS BEFORE ISSUING A FINAL NEGATIVE REPORT Performed at [facility]    Report Status PENDING  Incomplete  Culture, blood (routine x 2)     Status: None (Preliminary result)   Collection Time: 09/13/14 11:29 AM  Result Value Ref Range Status   Specimen Description BLOOD RIGHT ANTECUBITAL  Final   Special Requests BOTTLES DRAWN AEROBIC AND ANAEROBIC 5 CC EA  Final   Culture   Final           BLOOD CULTURE RECEIVED NO GROWTH TO DATE CULTURE WILL BE HELD FOR 5 DAYS BEFORE ISSUING A FINAL NEGATIVE REPORT Performed at [facility]    Report Status PENDING  Incomplete  MRSA PCR Screening     Status: None   Collection Time: 09/13/14  4:48 PM  Result Value Ref Range Status   MRSA by PCR NEGATIVE NEGATIVE Final    Comment:        The GeneXpert MRSA Assay (FDA approved for NASAL specimens only), is one component of a comprehensive MRSA colonization surveillance program. It is not intended to diagnose MRSA infection nor to guide or monitor treatment for MRSA infections.      Studies: Ct Abdomen Pelvis Wo Contrast  09/13/2014   CLINICAL DATA:  Sepsis.  EXAM: CT ABDOMEN AND PELVIS WITHOUT CONTRAST   TECHNIQUE: Multidetector CT imaging of the abdomen and pelvis was performed following the standard protocol without IV contrast.  COMPARISON:  CT scan of March 18, 2014.  FINDINGS: Visualized lung bases appear normal. No significant osseous abnormality is noted.  No gallstones are noted. No focal abnormality is noted in the liver, spleen or pancreas on these unenhanced images. Adrenal glands and kidneys appear normal. No hydronephrosis or renal obstruction is noted. No renal or ureteral calculi are noted. The appendix appears normal. There is no  evidence of bowel obstruction. Atherosclerosis of abdominal aorta is noted without aneurysm formation. Urinary bladder appears normal. No abnormal fluid collection is noted. No significant adenopathy is noted.  IMPRESSION: No acute abnormality seen in the abdomen or pelvis.   Electronically Signed   By: Marijo Conception, M.D.   On: 09/13/2014 16:17   Dg Chest 2 View (if Patient Has Fever And/or Copd)  09/13/2014   CLINICAL DATA:  Subsequent encounter for coughing congestion with mild Chest pain for couple of months.  EXAM: CHEST  2 VIEW  COMPARISON:  08/08/2014.  FINDINGS: The lungs are clear without focal infiltrate, edema, pneumothorax or pleural effusion. Cardiopericardial silhouette is at upper limits of normal for size. Imaged bony structures of the thorax are intact. Telemetry leads overlie the chest.  IMPRESSION: Upper normal to mildly enlarged heart.  Otherwise normal exam.   Electronically Signed   By: Misty Stanley M.D.   On: 09/13/2014 11:07    Scheduled Meds: . aspirin  81 mg Oral Daily  . carvedilol  3.125 mg Oral BID WC  . clopidogrel  75 mg Oral Daily  . dextromethorphan  15 mg Oral BID  . heparin  5,000 Units Subcutaneous 3 times per day  . LORazepam  0.5 mg Intravenous Once  . piperacillin-tazobactam (ZOSYN)  IV  3.375 g Intravenous Q8H  . sodium chloride  3 mL Intravenous Q12H  . vancomycin  750 mg Intravenous Q24H   Continuous  Infusions: . sodium chloride 125 mL/hr at 09/14/14 0920      Time spent: 35 minutes    Demari Kropp, [place]  [facility] Pager [phone]. If 7PM-7AM, please contact night-coverage at [email or web address], password [facility] 09/14/2014, 10:55 AM  LOS: 1 day

## 2014-09-14 NOTE — Progress Notes (Signed)
  Echocardiogram 2D Echocardiogram has been performed.  Bruce [name] 09/14/2014, 9:44 AM

## 2014-09-15 ENCOUNTER — Inpatient Hospital Stay (HOSPITAL_COMMUNITY): Payer: Medicare Other

## 2014-09-15 DIAGNOSIS — R7989 Other specified abnormal findings of blood chemistry: Secondary | ICD-10-CM

## 2014-09-15 DIAGNOSIS — R740 Nonspecific elevation of levels of transaminase and lactic acid dehydrogenase [LDH]: Secondary | ICD-10-CM

## 2014-09-15 DIAGNOSIS — R570 Cardiogenic shock: Secondary | ICD-10-CM

## 2014-09-15 DIAGNOSIS — R1011 Right upper quadrant pain: Secondary | ICD-10-CM

## 2014-09-15 LAB — URINE CULTURE
Colony Count: NO GROWTH
Culture: NO GROWTH

## 2014-09-15 LAB — COMPREHENSIVE METABOLIC PANEL
ALK PHOS: 253 U/L — AB (ref 38–126)
ALT: 1895 U/L — ABNORMAL HIGH (ref 17–63)
AST: 2766 U/L — ABNORMAL HIGH (ref 15–41)
Albumin: 3.4 g/dL — ABNORMAL LOW (ref 3.5–5.0)
Anion gap: 17 — ABNORMAL HIGH (ref 5–15)
BILIRUBIN TOTAL: 8.2 mg/dL — AB (ref 0.3–1.2)
BUN: 65 mg/dL — ABNORMAL HIGH (ref 6–20)
CALCIUM: 7.2 mg/dL — AB (ref 8.9–10.3)
CHLORIDE: 109 mmol/L (ref 101–111)
CO2: 13 mmol/L — AB (ref 22–32)
CREATININE: 3.3 mg/dL — AB (ref 0.61–1.24)
GFR calc Af Amer: 21 mL/min — ABNORMAL LOW (ref >60)
GFR, EST NON AFRICAN AMERICAN: 18 mL/min — AB (ref >60)
Glucose, Bld: 109 mg/dL — ABNORMAL HIGH (ref 65–99)
POTASSIUM: 5.1 mmol/L (ref 3.5–5.1)
Sodium: 139 mmol/L (ref 135–145)
TOTAL PROTEIN: 6.4 g/dL — AB (ref 6.5–8.1)

## 2014-09-15 LAB — CBC WITH DIFFERENTIAL/PLATELET
BASOS ABS: 0 10*3/uL (ref 0.0–0.1)
Basophils Relative: 0 % (ref 0–1)
EOS ABS: 0 10*3/uL (ref 0.0–0.7)
EOS PCT: 0 % (ref 0–5)
HCT: 33.4 % — ABNORMAL LOW (ref 39.0–52.0)
HEMOGLOBIN: 10.5 g/dL — AB (ref 13.0–17.0)
Lymphocytes Relative: 8 % — ABNORMAL LOW (ref 12–46)
Lymphs Abs: 1 10*3/uL (ref 0.7–4.0)
MCH: 28.2 pg (ref 26.0–34.0)
MCHC: 31.4 g/dL (ref 30.0–36.0)
MCV: 89.8 fL (ref 78.0–100.0)
Monocytes Absolute: 1.8 10*3/uL — ABNORMAL HIGH (ref 0.1–1.0)
Monocytes Relative: 14 % — ABNORMAL HIGH (ref 3–12)
NEUTROS ABS: 10.2 10*3/uL — AB (ref 1.7–7.7)
Neutrophils Relative %: 78 % — ABNORMAL HIGH (ref 43–77)
Platelets: 275 10*3/uL (ref 150–400)
RBC: 3.72 MIL/uL — ABNORMAL LOW (ref 4.22–5.81)
RDW: 16.9 % — AB (ref 11.5–15.5)
WBC: 12.9 10*3/uL — AB (ref 4.0–10.5)

## 2014-09-15 LAB — PROCALCITONIN: Procalcitonin: 1.28 ng/mL

## 2014-09-15 LAB — FERRITIN: Ferritin: >7500 ng/mL — ABNORMAL HIGH (ref 24–336)

## 2014-09-15 LAB — BRAIN NATRIURETIC PEPTIDE: B NATRIURETIC PEPTIDE 5: 1838.9 pg/mL — AB (ref 0.0–100.0)

## 2014-09-15 LAB — PHOSPHORUS: Phosphorus: 5 mg/dL — ABNORMAL HIGH (ref 2.5–4.6)

## 2014-09-15 LAB — CERULOPLASMIN: Ceruloplasmin: 43 mg/dL — ABNORMAL HIGH (ref 16.0–31.0)

## 2014-09-15 LAB — PROTIME-INR
INR: 4.2 — ABNORMAL HIGH (ref 0.00–1.49)
Prothrombin Time: 39.4 seconds — ABNORMAL HIGH (ref 11.6–15.2)

## 2014-09-15 LAB — MAGNESIUM: Magnesium: 2.3 mg/dL (ref 1.7–2.4)

## 2014-09-15 MED ORDER — VITAMIN K1 10 MG/ML IJ SOLN
5.0000 mg | Freq: Every day | INTRAMUSCULAR | Status: AC
  Administered 2014-09-15 – 2014-09-17 (×3): 5 mg via SUBCUTANEOUS
  Filled 2014-09-15 (×4): qty 0.5

## 2014-09-15 MED ORDER — MILRINONE IN DEXTROSE 20 MG/100ML IV SOLN
0.1250 ug/kg/min | INTRAVENOUS | Status: DC
  Administered 2014-09-15 – 2014-09-17 (×4): 0.25 ug/kg/min via INTRAVENOUS
  Administered 2014-09-18: 0.125 ug/kg/min via INTRAVENOUS
  Filled 2014-09-15 (×5): qty 100

## 2014-09-15 MED ORDER — PANTOPRAZOLE SODIUM 40 MG PO TBEC
40.0000 mg | DELAYED_RELEASE_TABLET | Freq: Every day | ORAL | Status: DC
  Administered 2014-09-15 – 2014-09-22 (×8): 40 mg via ORAL
  Filled 2014-09-15 (×9): qty 1

## 2014-09-15 MED ORDER — MILRINONE IN DEXTROSE 20 MG/100ML IV SOLN
0.2500 ug/kg/min | INTRAVENOUS | Status: DC
Start: 2014-09-15 — End: 2014-09-15
  Administered 2014-09-15: 0.25 ug/kg/min via INTRAVENOUS
  Filled 2014-09-15: qty 100

## 2014-09-15 MED ORDER — FUROSEMIDE 10 MG/ML IJ SOLN
80.0000 mg | Freq: Once | INTRAMUSCULAR | Status: AC
  Administered 2014-09-15: 80 mg via INTRAVENOUS
  Filled 2014-09-15: qty 8

## 2014-09-15 MED ORDER — CALCIUM CARBONATE ANTACID 500 MG PO CHEW
2.0000 | CHEWABLE_TABLET | Freq: Four times a day (QID) | ORAL | Status: DC | PRN
  Administered 2014-09-15 (×2): 400 mg via ORAL
  Filled 2014-09-15 (×2): qty 2

## 2014-09-15 NOTE — Progress Notes (Signed)
  [facility] Progress Note   Subjective: UOP increased directly after milrinone drip was started this am around 11   Filed Vitals:   09/15/14 1600 09/15/14 1900 09/15/14 2000 09/15/14 2100  BP: 116/69 103/55 102/53 106/57  Pulse: 86 86 83 82  Temp: 99.3 F (37.4 C)  97.7 F (36.5 C)   TempSrc: Oral  Oral   Resp: 27 22 24 21   Height:      Weight:      SpO2: 93% 90% 94% 93%   Exam: Alert, calm, no distress, dry mouth and lips Sclera anicteric, throat clear Flat neck veins Chest scant basilar crackles RRR no MRG, tachy, soft HD Abd soft, mild LUQ tenderness, +BS, no ascites GU normal No LE or UE edema No wounds, ulcers or rash No joint changes Neuro is alert, Ox 3, nf  UA - cloudy, orange, prot 100, pH 5, 1.026, 3-6 wbc and no rbc's UNa 11, Ucreat 325, FeNa 0.074 % CXR clear Home meds > albuterol, statin, xanax, coreg, plavix, dasatinib, lisinopril, lasix, percocet, KCl, prednisone  Assessment: 1. Renal failure, acute - UOP improving with milrinone, EF 10-15% by echo. Cardiorenal likely cause, consider renal toxicity of TKI's also.  Creat stable this am.  2. ^LFT's / TBili - chemo, cardiac congestion 3. CML on dasatinib (imatinib for a few months prior) 4. Prior etoh 5. Prior tobacco 6. HTN 7. HL 8. H/o CVA  Plan - cont inotropic support, change to renal diet, check labs in am    Kelly Splinter MD  pager [phone]    cell [phone]  09/15/2014, 9:19 PM     Recent Labs Lab 09/13/14 1037 09/14/14 0525 09/15/14 0406  NA 138 137 139  K 4.9 5.6* 5.1  CL 99* 104 109  CO2 15* 14* 13*  GLUCOSE 138* 115* 109*  BUN 49* 56* 65*  CREATININE 2.84* 3.30* 3.30*  CALCIUM 9.0 7.8* 7.2*  PHOS  --  5.3* 5.0*    Recent Labs Lab 09/13/14 1724 09/14/14 0525 09/15/14 0406  AST 852* 1596* 2766*  ALT 636* 1078* 1895*  ALKPHOS 271* 262* 253*  BILITOT 4.8* 6.1* 8.2*  PROT 6.9 6.7 6.4*  ALBUMIN 3.7 3.5 3.4*    Recent Labs Lab 09/13/14 1037  09/14/14 0525 09/15/14 0406  WBC 6.5 10.1 12.9*  NEUTROABS 4.3 7.4 10.2*  HGB 12.1* 11.0* 10.5*  HCT 38.1* 35.0* 33.4*  MCV 89.4 90.9 89.8  PLT 529* 368 275   . aspirin  81 mg Oral Daily  . carvedilol  3.125 mg Oral BID WC  . dextromethorphan  15 mg Oral BID  . LORazepam  0.5 mg Intravenous Once  . pantoprazole  40 mg Oral Daily  . phytonadione  5 mg Subcutaneous Daily  . sodium chloride  3 mL Intravenous Q12H   . milrinone 0.25 mcg/kg/min (09/15/14 2100)   albuterol, calcium carbonate, morphine injection, promethazine

## 2014-09-15 NOTE — Progress Notes (Signed)
Ref: [name], MD   Subjective:  Short of breath with JVD and poor LV systolic function. T max 99.4 degree F.  Objective:  Vital Signs in the last 24 hours: Temp:  [97.4 F (36.3 C)-99.4 F (37.4 C)] 99.4 F (37.4 C) (06/05 0800) Pulse Rate:  [85-96] 87 (06/05 0825) Cardiac Rhythm:  [-] Normal sinus rhythm (06/05 0815) Resp:  [20-35] 28 (06/05 0900) BP: (113-133)/(73-92) 133/85 mmHg (06/05 0825) SpO2:  [91 %-98 %] 93 % (06/05 0900)  Physical Exam: BP Readings from Last 1 Encounters:  09/15/14 133/85    Wt Readings from Last 1 Encounters:  09/13/14 74.4 kg (164 lb 0.4 oz)    Weight change:   HEENT: /AT, Eyes-Brown, PERL, EOMI, Conjunctiva-Pink, Sclera-Non-icteric Neck: + JVD, No bruit, Trachea midline. Lungs:  Basal crackles, Bilateral. Cardiac:  Regular rhythm, normal S1 and S2, no S3.  Abdomen:  Soft, non-tender. Extremities:  No edema present. No cyanosis. No clubbing. CNS: AxOx3, Cranial nerves grossly intact, moves all 4 extremities. Right handed. Skin: Warm and dry.   Intake/Output from previous day: 06/04 0701 - 06/05 0700 In: 4035.8 [I.V.:3261.3; IV Piggyback:774.5] Out: 501 [Urine:500; Stool:1]    Lab Results: BMET    Component Value Date/Time   NA 139 09/15/2014 0406   NA 137 09/14/2014 0525   NA 138 09/13/2014 1037   NA 142 07/31/2014 1244   NA 142 06/26/2014 1008   NA 140 05/13/2014 0937   K 5.1 09/15/2014 0406   K 5.6* 09/14/2014 0525   K 4.9 09/13/2014 1037   K 4.3 07/31/2014 1244   K 4.1 06/26/2014 1008   K 3.5 05/13/2014 0937   CL 109 09/15/2014 0406   CL 104 09/14/2014 0525   CL 99* 09/13/2014 1037   CO2 13* 09/15/2014 0406   CO2 14* 09/14/2014 0525   CO2 15* 09/13/2014 1037   CO2 22 07/31/2014 1244   CO2 25 06/26/2014 1008   CO2 22 05/13/2014 0937   GLUCOSE 109* 09/15/2014 0406   GLUCOSE 115* 09/14/2014 0525   GLUCOSE 138* 09/13/2014 1037   GLUCOSE 133 07/31/2014 1244   GLUCOSE 174* 06/26/2014 1008   GLUCOSE 210*  05/13/2014 0937   BUN 65* 09/15/2014 0406   BUN 56* 09/14/2014 0525   BUN 49* 09/13/2014 1037   BUN 18.3 07/31/2014 1244   BUN 8.3 06/26/2014 1008   BUN 9.1 05/13/2014 0937   CREATININE 3.30* 09/15/2014 0406   CREATININE 3.30* 09/14/2014 0525   CREATININE 2.84* 09/13/2014 1037   CREATININE 1.2 07/31/2014 1244   CREATININE 1.1 06/26/2014 1008   CREATININE 1.0 05/13/2014 0937   CALCIUM 7.2* 09/15/2014 0406   CALCIUM 7.8* 09/14/2014 0525   CALCIUM 9.0 09/13/2014 1037   CALCIUM 9.2 07/31/2014 1244   CALCIUM 9.0 06/26/2014 1008   CALCIUM 8.9 05/13/2014 0937   GFRNONAA 18* 09/15/2014 0406   GFRNONAA 18* 09/14/2014 0525   GFRNONAA 21* 09/13/2014 1037   GFRAA 21* 09/15/2014 0406   GFRAA 21* 09/14/2014 0525   GFRAA 25* 09/13/2014 1037   CBC    Component Value Date/Time   WBC 12.9* 09/15/2014 0406   WBC 21.3* 07/31/2014 1244   RBC 3.72* 09/15/2014 0406   RBC 3.58* 07/31/2014 1244   HGB 10.5* 09/15/2014 0406   HGB 10.3* 07/31/2014 1244   HCT 33.4* 09/15/2014 0406   HCT 32.9* 07/31/2014 1244   PLT 275 09/15/2014 0406   PLT 831* 07/31/2014 1244   MCV 89.8 09/15/2014 0406   MCV 91.9 07/31/2014 1244  MCH 28.2 09/15/2014 0406   MCH 28.9 07/31/2014 1244   MCHC 31.4 09/15/2014 0406   MCHC 31.4* 07/31/2014 1244   RDW 16.9* 09/15/2014 0406   RDW 17.3* 07/31/2014 1244   LYMPHSABS 1.0 09/15/2014 0406   LYMPHSABS 3.6* 07/31/2014 1244   MONOABS 1.8* 09/15/2014 0406   MONOABS 1.2* 07/31/2014 1244   EOSABS 0.0 09/15/2014 0406   EOSABS 0.4 07/31/2014 1244   BASOSABS 0.0 09/15/2014 0406   BASOSABS 0.0 07/31/2014 1244   HEPATIC Function Panel  Recent Labs  09/13/14 1724 09/14/14 0525 09/15/14 0406  PROT 6.9 6.7 6.4*   HEMOGLOBIN A1C No components found for: HGA1C,  MPG CARDIAC ENZYMES Lab Results  Component Value Date   CKTOTAL 165 09/13/2014   CKMB 2.4 12/17/2010   TROPONINI 0.55* 09/14/2014   TROPONINI 0.47* 09/14/2014   TROPONINI 0.49* 09/13/2014   BNP  Recent  Labs  03/05/14 1515  PROBNP 376.5*   TSH No results for input(s): TSH in the last 8760 hours. CHOLESTEROL  Recent Labs  03/06/14 0306  CHOL 212*    Scheduled Meds: . aspirin  81 mg Oral Daily  . carvedilol  3.125 mg Oral BID WC  . dextromethorphan  15 mg Oral BID  . LORazepam  0.5 mg Intravenous Once  . pantoprazole  40 mg Oral Daily  . phytonadione  5 mg Subcutaneous Daily  . sodium chloride  3 mL Intravenous Q12H   Continuous Infusions:  PRN Meds:.albuterol, calcium carbonate, morphine injection, promethazine  Assessment/Plan: Minimally elevated troponin I secondary to renal dysfunction. Mild CAD in the past Nonischemic cardiomyopathy Hypertension Chronic myeloid leukemia Multiorgan failure rule out sepsis/DIC Dehydration Acute renal injury History of CVA 2 in the past EtOH abuse Tobacco abuse Questionable status post TIA  IV milrinone. Agree with lasix use.    LOS: 2 days    Dixie Dials  MD  09/15/2014, 10:55 AM

## 2014-09-15 NOTE — Progress Notes (Signed)
Daily Rounding Note  09/15/2014, 8:51 AM  LOS: 2 days   SUBJECTIVE:       Tired, some  SOB and dry cough.  Discomfort in RUQ.  anorexia  OBJECTIVE:         Vital signs in last 24 hours:    Temp:  [97.4 F (36.3 C)-99.4 F (37.4 C)] 99.4 F (37.4 C) (06/05 0800) Pulse Rate:  [85-96] 87 (06/05 0825) Resp:  [20-35] 31 (06/05 0600) BP: (113-133)/(73-92) 133/85 mmHg (06/05 0825) SpO2:  [91 %-98 %] 94 % (06/05 0600) Last BM Date: 09/14/14 Filed Weights   09/13/14 1030 09/13/14 1700  Weight: 170 lb (77.111 kg) 164 lb 0.4 oz (74.4 kg)   General: looks better than expected.    Heart: RRR.  No MRG Chest: clear, no rales.  Dry cough Abdomen: soft, slight tenderness in RUQ  Extremities: no CCE.   Neuro/Psych:  Pleasant, alert, rambling. Moves all 4s.   Intake/Output from previous day: 06/04 0701 - 06/05 0700 In: 4035.8 [I.V.:3261.3; IV Piggyback:774.5] Out: 501 [Urine:500; Stool:1]  Intake/Output this shift:    Lab Results:  Recent Labs  09/13/14 1037 09/14/14 0525 09/15/14 0406  WBC 6.5 10.1 12.9*  HGB 12.1* 11.0* 10.5*  HCT 38.1* 35.0* 33.4*  PLT 529* 368 275   BMET  Recent Labs  09/13/14 1037 09/14/14 0525 09/15/14 0406  NA 138 137 139  K 4.9 5.6* 5.1  CL 99* 104 109  CO2 15* 14* 13*  GLUCOSE 138* 115* 109*  BUN 49* 56* 65*  CREATININE 2.84* 3.30* 3.30*  CALCIUM 9.0 7.8* 7.2*   LFT  Recent Labs  09/13/14 1037 09/13/14 1724 09/14/14 0525 09/15/14 0406  PROT 7.9 6.9 6.7 6.4*  ALBUMIN 4.3 3.7 3.5 3.4*  AST 542* 852* 1596* 2766*  ALT 455* 636* 1078* 1895*  ALKPHOS 300* 271* 262* 253*  BILITOT 4.3* 4.8* 6.1* 8.2*  BILIDIR 2.2* 2.7*  --   --   IBILI 2.1* 2.1*  --   --    PT/INR  Recent Labs  09/13/14 1428 09/15/14 0406  LABPROT 25.5* 39.4*  INR 2.35* 4.20*   Hepatitis Panel  Recent Labs  09/13/14 1332  HEPBSAG Negative*  HCVAB <0.1*  HEPAIGM Negative*  HEPBIGM Negative*     Studies/Results: Ct Abdomen Pelvis Wo Contrast  09/13/2014   CLINICAL DATA:  Sepsis.  EXAM: CT ABDOMEN AND PELVIS WITHOUT CONTRAST  TECHNIQUE: Multidetector CT imaging of the abdomen and pelvis was performed following the standard protocol without IV contrast.  COMPARISON:  CT scan of March 18, 2014.  FINDINGS: Visualized lung bases appear normal. No significant osseous abnormality is noted.  No gallstones are noted. No focal abnormality is noted in the liver, spleen or pancreas on these unenhanced images. Adrenal glands and kidneys appear normal. No hydronephrosis or renal obstruction is noted. No renal or ureteral calculi are noted. The appendix appears normal. There is no evidence of bowel obstruction. Atherosclerosis of abdominal aorta is noted without aneurysm formation. Urinary bladder appears normal. No abnormal fluid collection is noted. No significant adenopathy is noted.  IMPRESSION: No acute abnormality seen in the abdomen or pelvis.   Electronically Signed   By: Marijo Conception, M.D.   On: 09/13/2014 16:17   Dg Chest 2 View (if Patient Has Fever And/or Copd)  09/13/2014   CLINICAL DATA:  Subsequent encounter for coughing congestion with mild Chest pain for couple of months.  EXAM: CHEST  2  VIEW  COMPARISON:  08/08/2014.  FINDINGS: The lungs are clear without focal infiltrate, edema, pneumothorax or pleural effusion. Cardiopericardial silhouette is at upper limits of normal for size. Imaged bony structures of the thorax are intact. Telemetry leads overlie the chest.  IMPRESSION: Upper normal to mildly enlarged heart.  Otherwise normal exam.   Electronically Signed   By: Misty Stanley M.D.   On: 09/13/2014 11:07   US Abdomen Complete  09/14/2014   CLINICAL DATA:  Transaminitis, abdominal pain for 2 months with nausea and vomiting  EXAM: ULTRASOUND ABDOMEN COMPLETE  COMPARISON:  None ; correlation CT abdomen and pelvis 09/13/2014  FINDINGS: Gallbladder: Diffuse gallbladder wall thickening. No  shadowing calculi or sonographic Murphy sign. Minimal dependent sludge.  Common bile duct: Diameter: Normal caliber 2 mm diameter  Liver: No focal abnormalities  IVC: Normal appearance  Pancreas: Normal appearance  Spleen: Normal appearance, 4.8 cm length  Right Kidney: Length: 12.0 cm. Normal morphology without mass or hydronephrosis.  Left Kidney: Length: 11.1 cm. Normal morphology without mass or hydronephrosis.  Abdominal aorta: Proximally normal appearance, mid to distal person she obscured by bowel gas.  Other findings: Tiny amount of ascites adjacent to liver. Tiny RIGHT pleural effusion.  IMPRESSION: Diffuse gallbladder wall thickening and small amount of gallbladder sludge without evidence of stones or sonographic Murphy sign ; unable to exclude acute cholecystitis with this appearance.  If acute cholecystitis is a clinical consideration consider followup hepatobiliary imaging.  Tiny amount of ascites and tiny RIGHT pleural effusion.   Electronically Signed   By: Lavonia Dana M.D.   On: 09/14/2014 16:42   [place] Art/ven Flow Abd Pelv Doppler  09/14/2014   CLINICAL DATA:  Hepatic failure, elevated LFTs  EXAM: DUPLEX ULTRASOUND OF LIVER  TECHNIQUE: Color and duplex Doppler ultrasound was performed to evaluate the hepatic in-flow and out-flow vessels.  COMPARISON:  Abdominal ultrasound 09/14/2014  FINDINGS: Portal Vein Velocities  Main:  30 cm/sec  Right:  29 cm/sec  Left:  34 cm/sec  Hepatic Vein Velocities  Right:  112 cm/sec  Middle:  72 cm/sec  Left:  109 cm/sec  Hepatic Artery Velocity:  148 cm/sec  Splenic Vein Velocity:  12 cm/sec  Varices: Absent  Ascites: Absent  Flow within main, RIGHT, and LEFT portal veins is pulsatile and bidirectional.  No focal hepatic abnormalities visualized.  IMPRESSION: Bidirectional flow within the main, RIGHT, and LEFT portal veins suggesting elevated portal venous pressure.  This can be seen with cirrhosis though this can also be seen with post hepatic causes including  elevated RIGHT heart pressures and tricuspid valvular disease  No evidence of venous occlusion.   Electronically Signed   By: Lavonia Dana M.D.   On: 09/14/2014 18:22   Scheduled Meds: . aspirin  81 mg Oral Daily  . carvedilol  3.125 mg Oral BID WC  . dextromethorphan  15 mg Oral BID  . heparin  5,000 Units Subcutaneous 3 times per day  . LORazepam  0.5 mg Intravenous Once  . pantoprazole  40 mg Oral Daily  . piperacillin-tazobactam (ZOSYN)  IV  3.375 g Intravenous Q8H  . sodium chloride  3 mL Intravenous Q12H   Continuous Infusions: . sodium chloride 150 mL/hr (09/15/14 0824)   PRN Meds:.albuterol, calcium carbonate, morphine injection, promethazine   ASSESMENT:   * Marked elevation of LFTs with coagulopathy.Hep ABC negative. Ammonia level normal.  CT negative, ultrasound: thickened GB wall, minor sludge, tiny ascites. Dopper pressures elevated/?portal htn: raising ? Right heart failure, ?  Tricuspid valve disease, ? Cirrhosis.  Given echo findings wonder if this is low flow state from heart failure?  ? Drug toxicity, ? Ischemia/hypoperfusion? Pt denies etoh use and no etoh level obtained.  Rising LFTs and PT/INR,   *  Heart failure.  LVEF is 10 - 15%. Severe hypokinesis. Moderately increased pulmonary artery pressures. Moderate tricuspid regurge. Right and left atrial dilatation. MV regurge.   *  Coagulopathy. Receiving SQ heparin.   *  Normocytic anemia.   * CML on dacatenib.  * Chronic plavix. On hold   *  AKI.  Stable labs.    PLAN   *  Supportive care.   *  Several pending autoimmune markers.  *  Stop Heparin, check BNP, give vitamin K *  ? Stop Zosyn? *  Heart healthy diet.     Azucena Freed  09/15/2014, 8:51 AM Pager: [phone] Attending MD note:   I have taken a history, and reviewed the chart. I agree with the Advanced Practitioner's impression and recommendations. Intrahepatic ? Vascular congestion  Is a possibility. No evidence for extrahepatic biliary  disease. Incidental finding of thickened gall bladder wall. Will see prn  Mel[facility] Pager # [phone]

## 2014-09-15 NOTE — Plan of Care (Signed)
Problem: Phase I Progression Outcomes Goal: OOB as tolerated unless otherwise ordered Outcome: Not Progressing SHOB with slight movement in the bed. Goal: Other Phase I Outcomes/Goals Outcome: Not Progressing Very poor PO intake.  Problem: Phase II Progression Outcomes Goal: Other Phase II Outcomes/Goals Outcome: Not Progressing Spiritual- Patient is worried about his progress.  He is wondering if this is the end.

## 2014-09-15 NOTE — Progress Notes (Addendum)
Pharmacy Consult for Milrinone (Primacor) Initiation  Indication:   Acute decompensated heart failure > LVEF 10-15%  No Known Allergies  Temp:  [97.4 F (36.3 C)-99.4 F (37.4 C)] 99.4 F (37.4 C) (06/05 0800) Pulse Rate:  [85-96] 87 (06/05 0825) Cardiac Rhythm:  [-] Normal sinus rhythm (06/05 0815) Resp:  [20-35] 28 (06/05 0900) BP: (113-133)/(73-92) 133/85 mmHg (06/05 0825) SpO2:  [91 %-98 %] 93 % (06/05 0900)  LABS    Component Value Date/Time   NA 139 09/15/2014 0406   NA 142 07/31/2014 1244   K 5.1 09/15/2014 0406   K 4.3 07/31/2014 1244   CL 109 09/15/2014 0406   CO2 13* 09/15/2014 0406   CO2 22 07/31/2014 1244   GLUCOSE 109* 09/15/2014 0406   GLUCOSE 133 07/31/2014 1244   BUN 65* 09/15/2014 0406   BUN 18.3 07/31/2014 1244   CREATININE 3.30* 09/15/2014 0406   CREATININE 1.2 07/31/2014 1244   CALCIUM 7.2* 09/15/2014 0406   CALCIUM 9.2 07/31/2014 1244   GFRNONAA 18* 09/15/2014 0406   GFRAA 21* 09/15/2014 0406   Last magnesium:  Lab Results  Component Value Date   MG 2.3 09/15/2014   Estimated Creatinine Clearance: 20.7 mL/min (by C-G formula based on Cr of 3.3). CREATININE: 3.3 mg/dL ABNORMAL (09/15/14 0406) Estimated creatinine clearance - 20.7 mL/min estimated creatinine clearance is 20.7 mL/min (by C-G formula based on Cr of 3.3).   Intake/Output Summary (Last 24 hours) at 09/15/14 1117 Last data filed at 09/15/14 3383  Gross per 24 hour  Intake 4054.08 ml  Output    351 ml  Net 3703.08 ml    Filed Weights   09/13/14 1030 09/13/14 1700  Weight: 170 lb (77.111 kg) 164 lb 0.4 oz (74.4 kg)    Assessment: 69 y/o M, Jehovah's Witness, smoker / ETOH use, with PMH of CML who presented to [facility] ER with reports of worsening SOB. He was found to have AKI, mild tachycardia and elevated lactic acid   Today, 09/15/2014:  Lytes: wnl except K and phos slightly elevated  VSS: tachypneic, otherwise stable.  Occasional low O2 sats  Last QTc > 500     Plan:  Initiate milrinone based on renal function: (Consider starting dose of 0.125 - 0.25 for patients with SBP <144mmHg) [employer] [facility] Calculated CrCl Dose  []  > 50 ml/min 0.375 mcg/kg/min  [x]  20-49 ml/min 0.250 mcg/kg/min  []  < 20 ml/min 0.125 mcg/kg/min    Nursing to monitor vital signs per milrinone protocol and physician parameters.   Milrinone can cause arrhythmias.  Monitor patient for ECG changes.  Please contact MD for further dosing instructions.  Thank you for allowing [place] to be a part of this patient's care.  Reuel Boom, PharmD Pager: [phone] 09/15/2014, 11:18 AM

## 2014-09-15 NOTE — Progress Notes (Signed)
PULMONARY / CRITICAL CARE MEDICINE   Name: Bruce [name] MRN: [id number] DOB: [birth year]    ADMISSION DATE:  09/13/2014 CONSULTATION DATE:  09/13/14  REFERRING MD :  Dr. Clementeen Graham   CHIEF COMPLAINT:  Weakness   INITIAL PRESENTATION: 69 y/o M, Jehovah's Witness, smoker / ETOH use, with PMH of CML who presented to [facility] ER with reports of worsening SOB.  He was found to have AKI, mild tachycardia and elevated lactic acid.  PCCM consulted for evaluation.    STUDIES:  6/03  CT ABD >> no acute intra-abdominal pathology 6/4 Echo LVEF 10-15%, mild LVH, mild mR, RV dilated, RVSP 48 6/4 RUQ U/S with doppler> normal liver, GB wall thickening, sludge, portal vein pressures increased  SIGNIFICANT EVENTS: 6/03  Admit per [facility] with worsening SOB, AKI, mild tachycardia and elevated lactic acid   SUBJECTIVE:  Worsening liver function, echo as above  VITAL SIGNS: Temp:  [97.4 F (36.3 C)-99.4 F (37.4 C)] 99.4 F (37.4 C) (06/05 0800) Pulse Rate:  [85-96] 87 (06/05 0825) Resp:  [20-35] 28 (06/05 0900) BP: (113-133)/(73-92) 133/85 mmHg (06/05 0825) SpO2:  [91 %-98 %] 93 % (06/05 0900)   HEMODYNAMICS:     VENTILATOR SETTINGS:     INTAKE / OUTPUT:  Intake/Output Summary (Last 24 hours) at 09/15/14 0922 Last data filed at 09/15/14 0903  Gross per 24 hour  Intake 4054.08 ml  Output    351 ml  Net 3703.08 ml    PHYSICAL EXAMINATION:  Gen: mildly ill appearing in bed HENT: OP clear, TM's clear, neck supple Eyes: normal PULM: few crackles bases CV: RRR, no mgr, trace edema GI: BS+, soft, mildly tender RUQ, no guarding or rebound tenderness Derm: no cyanosis or rash Psyche: normal mood and affect   LABS:  CBC  Recent Labs Lab 09/13/14 1037 09/14/14 0525 09/15/14 0406  WBC 6.5 10.1 12.9*  HGB 12.1* 11.0* 10.5*  HCT 38.1* 35.0* 33.4*  PLT 529* 368 275   Coag's  Recent Labs Lab 09/13/14 1428 09/15/14 0406  APTT 32  --   INR 2.35* 4.20*     BMET  Recent Labs Lab  09/13/14 1037 09/14/14 0525 09/15/14 0406  NA 138 137 139  K 4.9 5.6* 5.1  CL 99* 104 109  CO2 15* 14* 13*  BUN 49* 56* 65*  CREATININE 2.84* 3.30* 3.30*  GLUCOSE 138* 115* 109*   Electrolytes  Recent Labs Lab 09/13/14 1037 09/13/14 1451 09/14/14 0525 09/15/14 0406  CALCIUM 9.0  --  7.8* 7.2*  MG  --  2.6* 2.4 2.3  PHOS  --   --  5.3* 5.0*   Sepsis Markers  Recent Labs Lab 09/13/14 1341  09/13/14 1554 09/14/14 0525 09/14/14 0816 09/14/14 1117 09/15/14 0406  LATICACIDVEN 6.82*  --   --   --  5.4* 6.5*  --   PROCALCITON  --   < > 0.56 1.11  --   --  1.28  < > = values in this interval not displayed. ABG No results for input(s): PHART, PCO2ART, PO2ART in the last 168 hours.   Liver Enzymes  Recent Labs Lab 09/13/14 1724 09/14/14 0525 09/15/14 0406  AST 852* 1596* 2766*  ALT 636* 1078* 1895*  ALKPHOS 271* 262* 253*  BILITOT 4.8* 6.1* 8.2*  ALBUMIN 3.7 3.5 3.4*   Cardiac Enzymes  Recent Labs Lab 09/13/14 2300 09/14/14 0525 09/14/14 1117  TROPONINI 0.49* 0.47* 0.55*   Glucose No results for input(s): GLUCAP in the last 168 hours.  Imaging 6/3 CXR > mild LVH, no pulmonary infiltrate,    ASSESSMENT / PLAN:  PULMONARY OETT A: Dyspnea - worse 6/5 due to pulmonary edema Cough - recent bronchitis with admission  P:   Diurese today Pulmonary hygiene:  IS, mobilize as able Oxygen as needed to support sats > 90%  Delsym for cough   CARDIOVASCULAR CVL A:  Acute decompensated heart failure > LVEF 10-15% (worse than prior echo) >  Chest Pain > resolved Elevated Troponin - demand ischemia most likely, but no EKG changes, suspect elevated Cr related  Hx HTN, HLD, CAD,  P:  Lasix now, does he need an inotrope? Hold lisinopril Continue home plavix, coreg for now Tele monitoring  Cardiology to see today  RENAL A:   Anion Gap Metabolic Acidosis / Lactic Acidosis - lactic acid not clearing due to liver injury and AKI, he is not in shock;  AKI>  presumably related to decompensated heart failure P:   Monitor BMET and UOP Replace electrolytes as needed Renal consult today Stop IVF Start lasix  GASTROINTESTINAL A:   Abdominal Pain with Elevated Transaminitis and liver failure - no clear source, could this be due to heart failure? P:   Trend LFT's  PT/INR daily F/U infectious serologies Lasix now  HEMATOLOGIC A:   CML - followed by Dr. 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    - Inter-disciplinary family meet or Palliative Care meeting due by:  6/10   GLOBAL:  Liver and renal failure worse today, cause still uncertain, but now favor unifying diagnosis of decompensated heart failure.  Will ask cardiology to evaluate> suspect we need vasopressors/inotropes.  Transfer to [facility]? LVAD eval?  Will give lasix now, follow clinically  My cc time 40 minutes  Roselie Awkward, MD [place] [facility] Pager: [phone] Cell: [phone] After 3pm or if no response, call [phone]   09/15/2014, 9:22 AM

## 2014-09-16 DIAGNOSIS — N179 Acute kidney failure, unspecified: Secondary | ICD-10-CM

## 2014-09-16 DIAGNOSIS — C9210 Chronic myeloid leukemia, BCR/ABL-positive, not having achieved remission: Secondary | ICD-10-CM

## 2014-09-16 DIAGNOSIS — K7200 Acute and subacute hepatic failure without coma: Secondary | ICD-10-CM

## 2014-09-16 DIAGNOSIS — D649 Anemia, unspecified: Secondary | ICD-10-CM

## 2014-09-16 LAB — CBC WITH DIFFERENTIAL/PLATELET
BASOS ABS: 0 10*3/uL (ref 0.0–0.1)
Basophils Relative: 0 % (ref 0–1)
EOS ABS: 0 10*3/uL (ref 0.0–0.7)
EOS PCT: 0 % (ref 0–5)
HEMATOCRIT: 27.7 % — AB (ref 39.0–52.0)
Hemoglobin: 8.9 g/dL — ABNORMAL LOW (ref 13.0–17.0)
Lymphocytes Relative: 10 % — ABNORMAL LOW (ref 12–46)
Lymphs Abs: 1.5 10*3/uL (ref 0.7–4.0)
MCH: 28.8 pg (ref 26.0–34.0)
MCHC: 32.1 g/dL (ref 30.0–36.0)
MCV: 89.6 fL (ref 78.0–100.0)
MONO ABS: 1.3 10*3/uL — AB (ref 0.1–1.0)
Monocytes Relative: 9 % (ref 3–12)
Neutro Abs: 11.3 10*3/uL — ABNORMAL HIGH (ref 1.7–7.7)
Neutrophils Relative %: 81 % — ABNORMAL HIGH (ref 43–77)
PLATELETS: 257 10*3/uL (ref 150–400)
RBC: 3.09 MIL/uL — ABNORMAL LOW (ref 4.22–5.81)
RDW: 17 % — ABNORMAL HIGH (ref 11.5–15.5)
WBC: 14 10*3/uL — ABNORMAL HIGH (ref 4.0–10.5)

## 2014-09-16 LAB — URINALYSIS, ROUTINE W REFLEX MICROSCOPIC
Glucose, UA: NEGATIVE mg/dL
HGB URINE DIPSTICK: NEGATIVE
KETONES UR: NEGATIVE mg/dL
Leukocytes, UA: NEGATIVE
NITRITE: NEGATIVE
PROTEIN: NEGATIVE mg/dL
Specific Gravity, Urine: 1.018 (ref 1.005–1.030)
UROBILINOGEN UA: 1 mg/dL (ref 0.0–1.0)
pH: 5 (ref 5.0–8.0)

## 2014-09-16 LAB — MAGNESIUM: Magnesium: 2.3 mg/dL (ref 1.7–2.4)

## 2014-09-16 LAB — COMPREHENSIVE METABOLIC PANEL
ALBUMIN: 3 g/dL — AB (ref 3.5–5.0)
ALT: 1888 U/L — AB (ref 17–63)
ANION GAP: 12 (ref 5–15)
AST: 2194 U/L — AB (ref 15–41)
Alkaline Phosphatase: 202 U/L — ABNORMAL HIGH (ref 38–126)
BUN: 73 mg/dL — ABNORMAL HIGH (ref 6–20)
CALCIUM: 7.2 mg/dL — AB (ref 8.9–10.3)
CHLORIDE: 108 mmol/L (ref 101–111)
CO2: 17 mmol/L — ABNORMAL LOW (ref 22–32)
CREATININE: 3.41 mg/dL — AB (ref 0.61–1.24)
GFR calc Af Amer: 20 mL/min — ABNORMAL LOW (ref >60)
GFR, EST NON AFRICAN AMERICAN: 17 mL/min — AB (ref >60)
Glucose, Bld: 155 mg/dL — ABNORMAL HIGH (ref 65–99)
POTASSIUM: 4 mmol/L (ref 3.5–5.1)
Sodium: 137 mmol/L (ref 135–145)
TOTAL PROTEIN: 5.6 g/dL — AB (ref 6.5–8.1)
Total Bilirubin: 8 mg/dL — ABNORMAL HIGH (ref 0.3–1.2)

## 2014-09-16 LAB — IRON AND TIBC
IRON: 217 ug/dL — AB (ref 45–182)
Saturation Ratios: 93 % — ABNORMAL HIGH (ref 17.9–39.5)
TIBC: 234 ug/dL — ABNORMAL LOW (ref 250–450)
UIBC: 17 ug/dL

## 2014-09-16 LAB — PROTIME-INR
INR: 3.55 — AB (ref 0.00–1.49)
Prothrombin Time: 34.8 seconds — ABNORMAL HIGH (ref 11.6–15.2)

## 2014-09-16 LAB — DIFFERENTIAL
Basophils Absolute: 0 10*3/uL (ref 0.0–0.1)
Basophils Relative: 0 % (ref 0–1)
Eosinophils Absolute: 0 10*3/uL (ref 0.0–0.7)
Eosinophils Relative: 0 % (ref 0–5)
LYMPHS ABS: 1.4 10*3/uL (ref 0.7–4.0)
LYMPHS PCT: 10 % — AB (ref 12–46)
MONO ABS: 1.4 10*3/uL — AB (ref 0.1–1.0)
Monocytes Relative: 9 % (ref 3–12)
Neutro Abs: 11.9 10*3/uL — ABNORMAL HIGH (ref 1.7–7.7)
Neutrophils Relative %: 81 % — ABNORMAL HIGH (ref 43–77)

## 2014-09-16 LAB — CBC
HEMATOCRIT: 26.8 % — AB (ref 39.0–52.0)
HEMOGLOBIN: 8.5 g/dL — AB (ref 13.0–17.0)
MCH: 28.4 pg (ref 26.0–34.0)
MCHC: 31.7 g/dL (ref 30.0–36.0)
MCV: 89.6 fL (ref 78.0–100.0)
PLATELETS: 246 10*3/uL (ref 150–400)
RBC: 2.99 MIL/uL — AB (ref 4.22–5.81)
RDW: 17 % — ABNORMAL HIGH (ref 11.5–15.5)
WBC: 14.7 10*3/uL — AB (ref 4.0–10.5)

## 2014-09-16 LAB — PHOSPHORUS: PHOSPHORUS: 3.5 mg/dL (ref 2.5–4.6)

## 2014-09-16 LAB — B. BURGDORFI ANTIBODIES: B burgdorferi Ab IgG+IgM: <0.91 {ISR} (ref 0.00–0.90)

## 2014-09-16 LAB — ANTI-SMOOTH MUSCLE ANTIBODY, IGG: F-ACTIN AB IGG: 11 U (ref 0–19)

## 2014-09-16 LAB — MITOCHONDRIAL ANTIBODIES: MITOCHONDRIAL M2 AB, IGG: 4.8 U (ref 0.0–20.0)

## 2014-09-16 MED ORDER — SODIUM BICARBONATE 650 MG PO TABS
1300.0000 mg | ORAL_TABLET | Freq: Two times a day (BID) | ORAL | Status: DC
  Administered 2014-09-16 – 2014-09-18 (×6): 1300 mg via ORAL
  Filled 2014-09-16 (×6): qty 2

## 2014-09-16 MED ORDER — FUROSEMIDE 10 MG/ML IJ SOLN
80.0000 mg | Freq: Once | INTRAMUSCULAR | Status: AC
  Administered 2014-09-16: 80 mg via INTRAVENOUS
  Filled 2014-09-16: qty 8

## 2014-09-16 NOTE — Care Management Note (Signed)
Case Management Note  Patient Details  Name: Bruce [name] MRN: [id number] Date of Birth: Jan 05, 1946  Subjective/Objective:                 Chf, sepsis, ski   Action/Plan: tbd   Expected Discharge Date:   (unknown)               Expected Discharge Plan:  Home/Self Care  In-House Referral:  Chaplain  Discharge planning Services  CM Consult  Post Acute Care Choice:  NA Choice offered to:  NA  DME Arranged:  N/A DME Agency:  NA  HH Arranged:  NA HH Agency:  NA  Status of Service:     Medicare Important Message Given:    Date Medicare IM Given:    Medicare IM give by:    Date Additional Medicare IM Given:    Additional Medicare Important Message give by:     If discussed at [place] of Stay Meetings, dates discussed:    Additional Comments:  [name], RN 09/16/2014, 8:49 AM

## 2014-09-16 NOTE — Progress Notes (Signed)
PULMONARY / CRITICAL CARE MEDICINE   Name: Bruce [name] MRN: [id number] DOB: 1945-08-18    ADMISSION DATE:  09/13/2014 CONSULTATION DATE:  09/13/14  REFERRING MD :  Dr. Clementeen Graham   CHIEF COMPLAINT:  Weakness   INITIAL PRESENTATION:  69 y/o M, Jehovah's Witness, smoker / ETOH use, with PMH of CML who presented to [facility] ER with reports of worsening SOB.  He was found to have AKI, mild tachycardia and elevated lactic acid.  PCCM consulted for evaluation.    STUDIES:  6/03  CT ABD >> no acute intra-abdominal pathology 6/4 Echo LVEF 10-15%, mild LVH, mild mR, RV dilated, RVSP 48 6/4 RUQ U/S with doppler> normal liver, GB wall thickening, sludge, portal vein pressures increased   SIGNIFICANT EVENTS: 6/03  Admit per TRH with worsening SOB, AKI, mild tachycardia and elevated lactic acid 6/5    milrinone started   VITAL SIGNS: Temp:  [97.3 F (36.3 C)-99.3 F (37.4 C)] 97.3 F (36.3 C) (06/06 0800) Pulse Rate:  [80-87] 82 (06/06 0600) Resp:  [18-28] 22 (06/06 0600) BP: (91-133)/(44-85) 110/59 mmHg (06/06 0600) SpO2:  [89 %-95 %] 93 % (06/06 0600) Weight:  [81 kg (178 lb 9.2 oz)-83.6 kg (184 lb 4.9 oz)] 81 kg (178 lb 9.2 oz) (06/06 0400)  2 liters HEMODYNAMICS:     VENTILATOR SETTINGS:     INTAKE / OUTPUT:  Intake/Output Summary (Last 24 hours) at 09/16/14 0814 Last data filed at 09/16/14 0600  Gross per 24 hour  Intake 1424.52 ml  Output   1475 ml  Net -50.48 ml    PHYSICAL EXAMINATION:  Gen: lying in bed in NAD HENT: Right facial droop Eyes: Right 39mm, Left 28mm both brisk, round and reactive. Blind in left eye PULM: Clear, diminished in the bases bilaterally CV: No rubs, gallops, or murmurs GI: BS+, soft, mildly tender RUQ, no guarding or rebound tenderness Derm: no cyanosis or rash    LABS:  CBC  Recent Labs Lab 09/14/14 0525 09/15/14 0406 09/16/14 0342  WBC 10.1 12.9* 14.0*  HGB 11.0* 10.5* 8.9*  HCT 35.0* 33.4* 27.7*  PLT 368 275 257    Coag's  Recent Labs Lab 09/13/14 1428 09/15/14 0406 09/16/14 0342  APTT 32  --   --   INR 2.35* 4.20* 3.55*     BMET  Recent Labs Lab 09/14/14 0525 09/15/14 0406 09/16/14 0342  NA 137 139 137  K 5.6* 5.1 4.0  CL 104 109 108  CO2 14* 13* 17*  BUN 56* 65* 73*  CREATININE 3.30* 3.30* 3.41*  GLUCOSE 115* 109* 155*   Electrolytes  Recent Labs Lab 09/14/14 0525 09/15/14 0406 09/16/14 0342  CALCIUM 7.8* 7.2* 7.2*  MG 2.4 2.3 2.3  PHOS 5.3* 5.0* 3.5   Sepsis Markers  Recent Labs Lab 09/13/14 1341  09/13/14 1554 09/14/14 0525 09/14/14 0816 09/14/14 1117 09/15/14 0406  LATICACIDVEN 6.82*  --   --   --  5.4* 6.5*  --   PROCALCITON  --   < > 0.56 1.11  --   --  1.28  < > = values in this interval not displayed. ABG No results for input(s): PHART, PCO2ART, PO2ART in the last 168 hours.   Liver Enzymes  Recent Labs Lab 09/14/14 0525 09/15/14 0406 09/16/14 0342  AST 1596* 2766* 2194*  ALT 1078* 1895* 1888*  ALKPHOS 262* 253* 202*  BILITOT 6.1* 8.2* 8.0*  ALBUMIN 3.5 3.4* 3.0*   Cardiac Enzymes  Recent Labs Lab 09/13/14 2300 09/14/14 0525  09/14/14 1117  TROPONINI 0.49* 0.47* 0.55*   Glucose No results for input(s): GLUCAP in the last 168 hours.  Imaging 6/3 CXR > mild LVH, no pulmonary infiltrate,  6/5 CXR > cardiomegaly, no acute process   ASSESSMENT / PLAN:  PULMONARY OETT A: Dyspnea - improved since initiation of milrinone Cough - recent bronchitis with admission  P:   Pulmonary hygiene:  IS, mobilize as able Oxygen as needed to support sats > 90%  Delsym for cough   CARDIOVASCULAR CVL > Acute decompensated heart failure > LVEF 10-15% (worse than prior echo)   Chest Pain > resolved Elevated Troponin - demand ischemia most likely, but no EKG changes, suspect elevated Cr related  Hx HTN, HLD, CAD,  P:  Would like to place central venous line, Mr. Evinger is thinking about it Continue Milrinone Hold lisinopril Continue home  plavix, coreg for now Tele monitoring  Cardiology following  RENAL A:   Anion Gap Metabolic Acidosis / Lactic Acidosis - lactic acid not clearing due to liver injury and AKI, he is not in shock;  AKI> no better but UOP up, presumably related to decompensated heart failure P:   Monitor BMET and UOP- Net negative 6/6 but still 5L positive overall Will hold diuresis as clinical picture indicates he is dry vs push for net negative -1L Urine lytes Replace electrolytes as needed Renal following   GASTROINTESTINAL A:   Abdominal Pain with Elevated Transaminitis and liver failure - no clear source, could this be due to heart failure? P:   Continue to trend LFT's <<< 6/6 values decreasing PT/INR daily F/U infectious serologies   HEMATOLOGIC A:   CML - followed by Dr. Lisabeth Devoid Witness  Mild Anemia -  Without bleeding Coagulopathy P:  Vitamin K Defer CML regimen to onc Trend CBC  SCD's for DVT prophylaxis   INFECTIOUS A:   Immunocompromised State  P:   BCx2 6/03 >>  UA 6/03 >>  UC 6/03 >>  Sputum 6/03 >>  Leptospira Ab 6/4 > EBV PCR 6/4 > RMSF 6/4 > Lyme 6/4 >  Vanco, start date 6/3 >> 6/4 Zosyn, start date 6/3 >> 6/5  ENDOCRINE A:   Mild Hyperglycemia  P:   Monitor glucose, if consistently > 180, add SSI   NEUROLOGIC A:   Hx CVA, Bells Palsy with residual facial droop and L eye blindness Cane Dependent at Baseline Mild encephalopathy> multifactorial in setting of multi-organ failure P:   Minimize sedation  Hold MRI for now, non-focal exam doubt stroke Closely monitor neuro status  FAMILY  - Updates:  Peter Congo sister updated at length today  - Inter-disciplinary family meet or Palliative Care meeting due by:  6/10   GLOBAL:  Transaminase values decreasing today but worsening AKI. Improvement since addition of milrinone 6/5.      09/16/2014, 8:14 AM   Attending:  I have seen and examined the patient with nurse practitioner/resident and agree  with the note above.   Overall picture seems to be consistent with cardiogenic shock leading to AKI and Hepatic failure.  It's not clear to me if the worsening cardiomyopathy is due to the Sprycel, his alcohol intake, or less likely ischemia.  On exam he has JVD, S3, and crackles in the lower lobes again.  I would really like to place a CVL to monitor central venous O2 saturation, but he isn't agreeing to that now.  He has a bit of a defeatist personality unfortunately, so I have encouraged him to  allow [place] to pursue aggressive care and have asked his sister Peter Congo to come in to try to talk him into more aggressive care.  Will consult oncology for comment on the possible side effect of Sprycel.  Will continue lasix.  My cc time 45 minutes  Roselie Awkward, MD [place] [facility] Pager: [phone] Cell: [phone] After 3pm or if no response, call [phone]

## 2014-09-16 NOTE — Progress Notes (Signed)
Subjective:  Events of the weekend noted.  Patient denies any chest pain.  States breathing is slowly improving.noted to have markedly depressed LV systolic function, started on milrinone with improved diuresis.  Objective:  Vital Signs in the last 24 hours: Temp:  [97.3 F (36.3 C)-98.4 F (36.9 C)] 98.4 F (36.9 C) (06/06 1200) Pulse Rate:  [80-86] 85 (06/06 1200) Resp:  [18-26] 18 (06/06 1200) BP: (91-117)/(44-79) 116/75 mmHg (06/06 1200) SpO2:  [89 %-95 %] 94 % (06/06 1200) Weight:  [81 kg (178 lb 9.2 oz)] 81 kg (178 lb 9.2 oz) (06/06 0400)  Intake/Output from previous day: 06/05 0701 - 06/06 0700 In: 1580.8 [P.O.:940; I.V.:640.8] Out: 1475 [Urine:1125; Stool:350] Intake/Output from this shift: Total I/O In: 492.6 [P.O.:480; I.V.:12.6] Out: 401 [Urine:400; Stool:1]  Physical Exam: Neck: JVD - 8 cm above sternal notch, no adenopathy, no carotid bruit and supple, symmetrical, trachea midline Lungs: diminished breath sounds at bases with bilateral rhonchi and rales Heart: regular rate and rhythm, S1, S2 normal and soft systolic murmur and an S3 gallop noted Abdomen: soft, non-tender; bowel sounds normal; no masses,  no organomegaly Extremities: extremities normal, atraumatic, no cyanosis or edema  Lab Results:  Recent Labs  09/16/14 0342 09/16/14 1255  WBC 14.0* 14.7*  HGB 8.9* 8.5*  PLT 257 246    Recent Labs  09/15/14 0406 09/16/14 0342  NA 139 137  K 5.1 4.0  CL 109 108  CO2 13* 17*  GLUCOSE 109* 155*  BUN 65* 73*  CREATININE 3.30* 3.41*    Recent Labs  09/14/14 0525 09/14/14 1117  TROPONINI 0.47* 0.55*   Hepatic Function Panel  Recent Labs  09/16/14 0342  PROT 5.6*  ALBUMIN 3.0*  AST 2194*  ALT 1888*  ALKPHOS 202*  BILITOT 8.0*   No results for input(s): CHOL in the last 72 hours. No results for input(s): PROTIME in the last 72 hours.  Imaging: Imaging results have been reviewed and Dg Chest Port 1 View  09/15/2014   CLINICAL DATA:   Shortness of breath and productive cough for 2 months fourth today, history coronary artery disease, hypertension, smoking, CML  EXAM: PORTABLE CHEST - 1 VIEW  COMPARISON:  Portable exam 0925 hours compared to 09/13/2014  FINDINGS: Enlargement of cardiac silhouette.  Mediastinal contours and pulmonary vascularity normal.  Slight rotation to the RIGHT.  Lungs clear.  No pleural effusion or pneumothorax.  Bones unremarkable.  IMPRESSION: Enlargement of cardiac silhouette.  No acute abnormalities.   Electronically Signed   By: Lavonia Dana M.D.   On: 09/15/2014 11:32    Cardiac Studies:  Assessment/Plan:  Status postAtypical chest pain with minimally elevated troponin I can't secondary to type II MI due to demand ischemia. Mild CAD in the past Severe Nonischemic cardiomyopathy probably secondary to EtOH abuse and decompensated by tachycardia Increased LFTs,etiology multifactoriala shock liver/EtOH abuse/questionable chemotherapy Hypertension Chronic myeloid leukemia Multivessel history in failure rule out sepsis/DIC Dehydration Acute renal injury History of CVA 2 in the past EtOH abuse Tobacco abuse Questionable status post TIA Plan Continue present management.  Continue IV milrinone for now. We'll start carvedilol and BiDil once blood pressure improves  LOS: 3 days    [name] 09/16/2014, 5:28 PM

## 2014-09-16 NOTE — Progress Notes (Signed)
Date:  September 16, 2014 U.R. performed for needs and level of care. Will continue to follow for Case Management needs.  Felton Buczynski, RN, BSN, CCM   [phone] 

## 2014-09-16 NOTE — Progress Notes (Signed)
Subjective: Interval History: has complaints coughing , Bouts of SOB.  Objective: Vital signs in last 24 hours: Temp:  [97.3 F (36.3 C)-99.3 F (37.4 C)] 97.3 F (36.3 C) (06/06 0800) Pulse Rate:  [80-87] 81 (06/06 0800) Resp:  [18-28] 22 (06/06 0800) BP: (91-125)/(44-79) 117/63 mmHg (06/06 0800) SpO2:  [89 %-95 %] 95 % (06/06 0800) Weight:  [81 kg (178 lb 9.2 oz)] 81 kg (178 lb 9.2 oz) (06/06 0400) Weight change:   Intake/Output from previous day: 06/05 0701 - 06/06 0700 In: 1580.8 [P.O.:940; I.V.:640.8] Out: 1475 [Urine:1125; Stool:350] Intake/Output this shift: Total I/O In: 492.6 [P.O.:480; I.V.:12.6] Out: 201 [Urine:200; Stool:1]  General appearance: alert and cooperative Resp: diminished breath sounds bilaterally and rales bibasilar Cardio: S1, S2 normal and systolic murmur: holosystolic 2/6, blowing at apex GI: pos bs, liver down 5 cm ,soft Extremities: edema 2+  Lab Results:  Recent Labs  09/15/14 0406 09/16/14 0342  WBC 12.9* 14.0*  HGB 10.5* 8.9*  HCT 33.4* 27.7*  PLT 275 257   BMET:  Recent Labs  09/15/14 0406 09/16/14 0342  NA 139 137  K 5.1 4.0  CL 109 108  CO2 13* 17*  GLUCOSE 109* 155*  BUN 65* 73*  CREATININE 3.30* 3.41*  CALCIUM 7.2* 7.2*   No results for input(s): PTH in the last 72 hours. Iron Studies:  Recent Labs  09/14/14 1353  FERRITIN >7500*    Studies/Results: US Abdomen Complete  09/14/2014   CLINICAL DATA:  Transaminitis, abdominal pain for 2 months with nausea and vomiting  EXAM: ULTRASOUND ABDOMEN COMPLETE  COMPARISON:  None ; correlation CT abdomen and pelvis 09/13/2014  FINDINGS: Gallbladder: Diffuse gallbladder wall thickening. No shadowing calculi or sonographic Murphy sign. Minimal dependent sludge.  Common bile duct: Diameter: Normal caliber 2 mm diameter  Liver: No focal abnormalities  IVC: Normal appearance  Pancreas: Normal appearance  Spleen: Normal appearance, 4.8 cm length  Right Kidney: Length: 12.0 cm. Normal  morphology without mass or hydronephrosis.  Left Kidney: Length: 11.1 cm. Normal morphology without mass or hydronephrosis.  Abdominal aorta: Proximally normal appearance, mid to distal person she obscured by bowel gas.  Other findings: Tiny amount of ascites adjacent to liver. Tiny RIGHT pleural effusion.  IMPRESSION: Diffuse gallbladder wall thickening and small amount of gallbladder sludge without evidence of stones or sonographic Murphy sign ; unable to exclude acute cholecystitis with this appearance.  If acute cholecystitis is a clinical consideration consider followup hepatobiliary imaging.  Tiny amount of ascites and tiny RIGHT pleural effusion.   Electronically Signed   By: Lavonia Dana M.D.   On: 09/14/2014 16:42   [place] Art/ven Flow Abd Pelv Doppler  09/14/2014   CLINICAL DATA:  Hepatic failure, elevated LFTs  EXAM: DUPLEX ULTRASOUND OF LIVER  TECHNIQUE: Color and duplex Doppler ultrasound was performed to evaluate the hepatic in-flow and out-flow vessels.  COMPARISON:  Abdominal ultrasound 09/14/2014  FINDINGS: Portal Vein Velocities  Main:  30 cm/sec  Right:  29 cm/sec  Left:  34 cm/sec  Hepatic Vein Velocities  Right:  112 cm/sec  Middle:  72 cm/sec  Left:  109 cm/sec  Hepatic Artery Velocity:  148 cm/sec  Splenic Vein Velocity:  12 cm/sec  Varices: Absent  Ascites: Absent  Flow within main, RIGHT, and LEFT portal veins is pulsatile and bidirectional.  No focal hepatic abnormalities visualized.  IMPRESSION: Bidirectional flow within the main, RIGHT, and LEFT portal veins suggesting elevated portal venous pressure.  This can be seen with cirrhosis though  this can also be seen with post hepatic causes including elevated RIGHT heart pressures and tricuspid valvular disease  No evidence of venous occlusion.   Electronically Signed   By: Lavonia Dana M.D.   On: 09/14/2014 18:22   Dg Chest Port 1 View  09/15/2014   CLINICAL DATA:  Shortness of breath and productive cough for 2 months fourth today, history  coronary artery disease, hypertension, smoking, CML  EXAM: PORTABLE CHEST - 1 VIEW  COMPARISON:  Portable exam 0925 hours compared to 09/13/2014  FINDINGS: Enlargement of cardiac silhouette.  Mediastinal contours and pulmonary vascularity normal.  Slight rotation to the RIGHT.  Lungs clear.  No pleural effusion or pneumothorax.  Bones unremarkable.  IMPRESSION: Enlargement of cardiac silhouette.  No acute abnormalities.   Electronically Signed   By: Lavonia Dana M.D.   On: 09/15/2014 11:32    I have reviewed the patient's current medications.  Assessment/Plan: 1 AKI low FENA,, WBC in urine.  Cardiorenal, liver involvment vs AIN with TKI.  Will check eos.  ????I&O.  Vol xs. Some urine but Cr mildly ^, Acidemic K ok now.  This is not a good prognosis 2 CML   3 CM with EF 10%  On Milrinone.  Will hold Carvedilol with low bps and poor renal perfusion. 4 Anemia check Fe 5 ^^LFTs, cardiac vs other. P stop coreg, I&O, check eos,  Cont Milrinone.    LOS: 3 days   Keane Martelli L 09/16/2014,11:42 AM

## 2014-09-16 NOTE — Progress Notes (Signed)
Need to get CVL placement to help managed hemodynamics. Pt will not give consent at this point. Wants to "get with his people". Told him we spoke to his sister Peter Congo. He appreciated this but said he needed to run this by others as well.  For now we will hold off on CVL placement  Erick Colace ACNP-BC [place] Pager # [phone] OR # [phone] if no answer

## 2014-09-16 NOTE — Progress Notes (Signed)
Patient refuses to have foley catheter inserted. Urinating in urinal

## 2014-09-16 NOTE — Consult Note (Signed)
HEMATOLOGY-ONCOLOGY X-COVER  SUBJECTIVE: Patient reports profound weakness and fatigue  OBJECTIVE PHYSICAL EXAMINATION: ECOG PERFORMANCE STATUS: 3 - Symptomatic, >50% confined to bed  Filed Vitals:   09/16/14 1200  BP: 116/75  Pulse: 85  Temp: 98.4 F (36.9 C)  Resp: 18   Filed Weights   09/13/14 1700 09/15/14 1100 09/16/14 0400  Weight: 164 lb 0.4 oz (74.4 kg) 184 lb 4.9 oz (83.6 kg) 178 lb 9.2 oz (81 kg)    SKIN: skin color, texture, turgor are normal, no rashes or significant lesions EYES: Jaundice OROPHARYNX:no exudate, no erythema and lips, buccal mucosa, and tongue normal  LUNGS: clear to auscultation and percussion with normal breathing effort HEART: Soft ejection systolic murmur mitral area  ABDOmildly distended Musculoskeletal:no cyanosis of digits and no clubbing   LABORATORY DATA:  I have reviewed the data as listed CMP Latest Ref Rng 09/16/2014 09/15/2014 09/14/2014  Glucose 65 - 99 mg/dL 155(H) 109(H) 115(H)  BUN 6 - 20 mg/dL 73(H) 65(H) 56(H)  Creatinine 0.61 - 1.24 mg/dL 3.41(H) 3.30(H) 3.30(H)  Sodium 135 - 145 mmol/L 137 139 137  Potassium 3.5 - 5.1 mmol/L 4.0 5.1 5.6(H)  Chloride 101 - 111 mmol/L 108 109 104  CO2 22 - 32 mmol/L 17(L) 13(L) 14(L)  Calcium 8.9 - 10.3 mg/dL 7.2(L) 7.2(L) 7.8(L)  Total Protein 6.5 - 8.1 g/dL 5.6(L) 6.4(L) 6.7  Total Bilirubin 0.3 - 1.2 mg/dL 8.0(H) 8.2(H) 6.1(H)  Alkaline Phos 38 - 126 U/L 202(H) 253(H) 262(H)  AST 15 - 41 U/L 2194(H) 2766(H) 1596(H)  ALT 17 - 63 U/L 1888(H) 1895(H) 1078(H)      Lab Results  Component Value Date   WBC 14.7* 09/16/2014   HGB 8.5* 09/16/2014   HCT 26.8* 09/16/2014   MCV 89.6 09/16/2014   PLT 246 09/16/2014   NEUTROABS 11.9* 09/16/2014    ASSESSMENT AND PLAN:  1. Acute hepatic failure: Unlikely due to Sprycel. I agree that it is related to underlying severe cardiomyopathy. Sprycel has a tendency to prolonged QT interval and can cause arrhythmias if it is significantly prolonged but he  does not cause cardiomyopathy.   2. Acute renal failure: Being closely watched and monitored  3. CML in chronic phase: Was on dasatinib. This will be on hold until he gets better  4. Anemia normocytic: Most likely related to underlying liver and kidney problems. Dr. Burr Medico will be in tomorrow to see the patient  Thank you for involving me in his care

## 2014-09-17 LAB — CBC WITH DIFFERENTIAL/PLATELET
Basophils Absolute: 0.1 10*3/uL (ref 0.0–0.1)
Basophils Relative: 0 % (ref 0–1)
Eosinophils Absolute: 0.1 10*3/uL (ref 0.0–0.7)
Eosinophils Relative: 0 % (ref 0–5)
HEMATOCRIT: 25.5 % — AB (ref 39.0–52.0)
HEMOGLOBIN: 8.1 g/dL — AB (ref 13.0–17.0)
Lymphocytes Relative: 10 % — ABNORMAL LOW (ref 12–46)
Lymphs Abs: 1.6 10*3/uL (ref 0.7–4.0)
MCH: 28.6 pg (ref 26.0–34.0)
MCHC: 31.8 g/dL (ref 30.0–36.0)
MCV: 90.1 fL (ref 78.0–100.0)
MONO ABS: 1.8 10*3/uL — AB (ref 0.1–1.0)
MONOS PCT: 11 % (ref 3–12)
NEUTROS ABS: 12.6 10*3/uL — AB (ref 1.7–7.7)
Neutrophils Relative %: 79 % — ABNORMAL HIGH (ref 43–77)
PLATELETS: 250 10*3/uL (ref 150–400)
RBC: 2.83 MIL/uL — ABNORMAL LOW (ref 4.22–5.81)
RDW: 17.2 % — ABNORMAL HIGH (ref 11.5–15.5)
WBC: 16.1 10*3/uL — ABNORMAL HIGH (ref 4.0–10.5)

## 2014-09-17 LAB — EPSTEIN BARR VRS(EBV DNA BY PCR)
EBV DNA QN by PCR: NEGATIVE copies/mL
log10 EBV DNA Qn PCR: UNDETERMINED log10copy/mL

## 2014-09-17 LAB — COMPREHENSIVE METABOLIC PANEL
ALT: 1480 U/L — AB (ref 17–63)
ANION GAP: 9 (ref 5–15)
AST: 1122 U/L — AB (ref 15–41)
Albumin: 2.8 g/dL — ABNORMAL LOW (ref 3.5–5.0)
Alkaline Phosphatase: 198 U/L — ABNORMAL HIGH (ref 38–126)
BILIRUBIN TOTAL: 9.5 mg/dL — AB (ref 0.3–1.2)
BUN: 72 mg/dL — ABNORMAL HIGH (ref 6–20)
CALCIUM: 7.4 mg/dL — AB (ref 8.9–10.3)
CO2: 19 mmol/L — ABNORMAL LOW (ref 22–32)
Chloride: 108 mmol/L (ref 101–111)
Creatinine, Ser: 3.05 mg/dL — ABNORMAL HIGH (ref 0.61–1.24)
GFR calc Af Amer: 23 mL/min — ABNORMAL LOW (ref >60)
GFR calc non Af Amer: 20 mL/min — ABNORMAL LOW (ref >60)
GLUCOSE: 143 mg/dL — AB (ref 65–99)
POTASSIUM: 3.4 mmol/L — AB (ref 3.5–5.1)
Sodium: 136 mmol/L (ref 135–145)
TOTAL PROTEIN: 5.5 g/dL — AB (ref 6.5–8.1)

## 2014-09-17 LAB — MAGNESIUM: Magnesium: 2.3 mg/dL (ref 1.7–2.4)

## 2014-09-17 LAB — PROTIME-INR
INR: 2.33 — AB (ref 0.00–1.49)
PROTHROMBIN TIME: 25.3 s — AB (ref 11.6–15.2)

## 2014-09-17 LAB — ROCKY MTN SPOTTED FVR ABS PNL(IGG+IGM)
RMSF IGG: NEGATIVE
RMSF IGM: 0.18 {index} (ref 0.00–0.89)

## 2014-09-17 LAB — PHOSPHORUS: PHOSPHORUS: 2.2 mg/dL — AB (ref 2.5–4.6)

## 2014-09-17 MED ORDER — FUROSEMIDE 40 MG PO TABS
160.0000 mg | ORAL_TABLET | Freq: Two times a day (BID) | ORAL | Status: DC
  Administered 2014-09-17 – 2014-09-19 (×4): 160 mg via ORAL
  Filled 2014-09-17 (×4): qty 4

## 2014-09-17 MED ORDER — CARVEDILOL 3.125 MG PO TABS
3.1250 mg | ORAL_TABLET | Freq: Two times a day (BID) | ORAL | Status: DC
  Administered 2014-09-17 – 2014-09-22 (×10): 3.125 mg via ORAL
  Filled 2014-09-17 (×10): qty 1

## 2014-09-17 MED ORDER — POTASSIUM CHLORIDE CRYS ER 20 MEQ PO TBCR
40.0000 meq | EXTENDED_RELEASE_TABLET | Freq: Once | ORAL | Status: AC
  Administered 2014-09-17: 40 meq via ORAL
  Filled 2014-09-17: qty 2

## 2014-09-17 MED ORDER — FUROSEMIDE 10 MG/ML IJ SOLN
80.0000 mg | Freq: Two times a day (BID) | INTRAMUSCULAR | Status: DC
  Administered 2014-09-17 (×2): 80 mg via INTRAVENOUS
  Filled 2014-09-17 (×4): qty 8

## 2014-09-17 NOTE — Progress Notes (Signed)
PULMONARY / CRITICAL CARE MEDICINE   Name: Bruce [name] MRN: [id number] DOB: 12/26/1945    ADMISSION DATE:  09/13/2014 CONSULTATION DATE:  09/13/14  REFERRING MD :  Dr. Clementeen Graham   CHIEF COMPLAINT:  Weakness   INITIAL PRESENTATION:  69 y/o M, Jehovah's Witness, smoker / ETOH use, with PMH of CML who presented to [facility] ER with reports of worsening SOB.  He was found to have AKI, mild tachycardia and elevated lactic acid.  PCCM consulted for evaluation.    STUDIES:  6/03  CT ABD >> no acute intra-abdominal pathology 6/4 Echo LVEF 10-15%, mild LVH, mild mR, RV dilated, RVSP 48 6/4 RUQ U/S with doppler> normal liver, GB wall thickening, sludge, portal vein pressures increased   SIGNIFICANT EVENTS: 6/03  Admit per TRH with worsening SOB, AKI, mild tachycardia and elevated lactic acid 6/5    milrinone started   VITAL SIGNS: Temp:  [97.3 F (36.3 C)-98.5 F (36.9 C)] 98.5 F (36.9 C) (06/07 0400) Pulse Rate:  [77-85] 77 (06/07 0400) Resp:  [5-25] 23 (06/07 0400) BP: (103-117)/(51-75) 103/68 mmHg (06/07 0400) SpO2:  [90 %-95 %] 93 % (06/07 0400) Weight:  [81.7 kg (180 lb 1.9 oz)] 81.7 kg (180 lb 1.9 oz) (06/07 0400)  2 liters HEMODYNAMICS:     VENTILATOR SETTINGS:     INTAKE / OUTPUT:  Intake/Output Summary (Last 24 hours) at 09/17/14 0620 Last data filed at 09/17/14 0500  Gross per 24 hour  Intake 1484.9 ml  Output   1551 ml  Net  -66.1 ml    PHYSICAL EXAMINATION:  Gen: lying in bed in NAD eating breakfast HENT: right facial droop,scleral itcterus Eyes: 53mm bilaterally brisk, round and reactive. PULM: Right has course crackles throughout, diminished in based bilaterally CV: + JVD, No rubs, gallops, or murmurs GI: BS+, soft, mildly tender RUQ, no guarding or rebound tenderness Derm: no cyanosis or rash    LABS:  CBC  Recent Labs Lab 09/16/14 0342 09/16/14 1255 09/17/14 0330  WBC 14.0* 14.7* 16.1*  HGB 8.9* 8.5* 8.1*  HCT 27.7* 26.8* 25.5*  PLT 257 246  250   Coag's  Recent Labs Lab 09/13/14 1428 09/15/14 0406 09/16/14 0342 09/17/14 0330  APTT 32  --   --   --   INR 2.35* 4.20* 3.55* 2.33*     BMET  Recent Labs Lab 09/15/14 0406 09/16/14 0342 09/17/14 0330  NA 139 137 136  K 5.1 4.0 3.4*  CL 109 108 108  CO2 13* 17* 19*  BUN 65* 73* 72*  CREATININE 3.30* 3.41* 3.05*  GLUCOSE 109* 155* 143*   Electrolytes  Recent Labs Lab 09/15/14 0406 09/16/14 0342 09/17/14 0330  CALCIUM 7.2* 7.2* 7.4*  MG 2.3 2.3 2.3  PHOS 5.0* 3.5 2.2*   Sepsis Markers  Recent Labs Lab 09/13/14 1341  09/13/14 1554 09/14/14 0525 09/14/14 0816 09/14/14 1117 09/15/14 0406  LATICACIDVEN 6.82*  --   --   --  5.4* 6.5*  --   PROCALCITON  --   < > 0.56 1.11  --   --  1.28  < > = values in this interval not displayed. ABG No results for input(s): PHART, PCO2ART, PO2ART in the last 168 hours.   Liver Enzymes  Recent Labs Lab 09/15/14 0406 09/16/14 0342 09/17/14 0330  AST 2766* 2194* 1122*  ALT 1895* 1888* 1480*  ALKPHOS 253* 202* 198*  BILITOT 8.2* 8.0* 9.5*  ALBUMIN 3.4* 3.0* 2.8*   Cardiac Enzymes  Recent Labs Lab 09/13/14  2300 09/14/14 0525 09/14/14 1117  TROPONINI 0.49* 0.47* 0.55*   Glucose No results for input(s): GLUCAP in the last 168 hours.  Imaging 6/3 CXR > mild LVH, no pulmonary infiltrate,  6/5 CXR > cardiomegaly, no acute process   ASSESSMENT / PLAN:  PULMONARY OETT A: Dyspnea - improved since initiation of milrinone Cough - recent bronchitis with admission  P:   Pulmonary hygiene:  IS, mobilize as able Oxygen as needed to support sats > 90%  Delsym for cough   CARDIOVASCULAR CVL > Acute decompensated heart failure > LVEF 10-15% (worse than prior echo)   Chest Pain > resolved Elevated Troponin - demand ischemia most likely, but no EKG changes, suspect elevated Cr related  Hx HTN, HLD, CAD,  P:  Lasix for continued diuresis Continue Milrinone Hold lisinopril Hold home plavix, coreg for  now- cards will restart coreg and bidil once BP stable of milrinone Tele monitoring  Cardiology following-appreciate their assistance  RENAL A:   Anion Gap Metabolic Acidosis / Lactic Acidosis>>> resolved, AKI, Shock liver AKI> Scr down 6/7 P:   Monitor BMET and UOP Replace electrolytes as needed- potassium replaced 6/7 AM and PM Nephrology following   GASTROINTESTINAL A:   Abdominal Pain with Elevated Transaminitis and liver failure - no clear source, could this be due to heart failure? P:   Continue to trend LFT's <<< Transaminase values trending downward- TBili increased likely lagging LFTs in AM F/U infectious serologies   HEMATOLOGIC A:   CML - followed by Dr. Lisabeth Devoid Witness  Mild Anemia -  Without bleeding Coagulopathy P:  PT/INR daily Continue Vitamin K Heme/Onc consulted and following Defer CML regimen to onc Trend CBC  SCD's for DVT prophylaxis   INFECTIOUS A:   Leukocytosis, increased from 14 to 16 6/7 Immunocompromised State   P:   Will hold abx for now but careful monitoring for infectious etiology Trend fever curve BCx2 6/03 >> NGTD UA 6/03 >> negative UC 6/03 >> NGTD Sputum 6/03 >>  Leptospira Ab 6/4 > EBV PCR 6/4 > RMSF 6/4 >negative Lyme 6/4 >  Vanco, start date 6/3 >> 6/4 Zosyn, start date 6/3 >> 6/5  ENDOCRINE A:   Mild Hyperglycemia  P:   Monitor glucose, if consistently > 180, add SSI   NEUROLOGIC A:   Hx CVA, Bells Palsy with residual facial droop and L eye blindness Cane Dependent at Baseline Mild encephalopathy> multifactorial in setting of multi-organ failure P:   Minimize sedation  Hold MRI for now, non-focal exam doubt stroke Closely monitor neuro status  FAMILY  - Updates:  Sister stated she would be in 6/7 to see patient.  - Inter-disciplinary family meet or Palliative Care meeting due by:  6/10   GLOBAL: looks a little better. Will cont inotrope and diuresis. Hope we can wean off in next 24-48 hrs. Then  add back b-blocker.   Erick Colace ACNP-BC [place] Pager # [phone] OR # [phone] if no answer      09/17/2014, 6:20 AM

## 2014-09-17 NOTE — Progress Notes (Signed)
Subjective: Interval History: has complaints still coughing and intermittent SOB.  Objective: Vital signs in last 24 hours: Temp:  [98.1 F (36.7 C)-98.8 F (37.1 C)] 98.8 F (37.1 C) (06/07 0800) Pulse Rate:  [77-89] 87 (06/07 1100) Resp:  [5-26] 26 (06/07 1100) BP: (103-150)/(51-92) 132/86 mmHg (06/07 1100) SpO2:  [90 %-95 %] 92 % (06/07 1100) Weight:  [81.7 kg (180 lb 1.9 oz)] 81.7 kg (180 lb 1.9 oz) (06/07 0400) Weight change: -1.9 kg (-4 lb 3 oz)  Intake/Output from previous day: 06/06 0701 - 06/07 0700 In: 1684.9 [P.O.:1310; I.V.:374.9] Out: 1726 [Urine:1725; Stool:1] Intake/Output this shift: Total I/O In: 271.5 [P.O.:240; I.V.:31.5] Out: 201 [Urine:200; Stool:1]  General appearance: alert, cooperative and coughing Resp: diminished breath sounds bilaterally and rales bibasilar Cardio: S1, S2 normal and systolic murmur: holosystolic 2/6, blowing at apex GI: liver down 7 cm ,pos bs Extremities: edema 2+ presacral  Lab Results:  Recent Labs  09/16/14 1255 09/17/14 0330  WBC 14.7* 16.1*  HGB 8.5* 8.1*  HCT 26.8* 25.5*  PLT 246 250   BMET:  Recent Labs  09/16/14 0342 09/17/14 0330  NA 137 136  K 4.0 3.4*  CL 108 108  CO2 17* 19*  GLUCOSE 155* 143*  BUN 73* 72*  CREATININE 3.41* 3.05*  CALCIUM 7.2* 7.4*   No results for input(s): PTH in the last 72 hours. Iron Studies:  Recent Labs  09/14/14 1353 09/16/14 1255  IRON  --  217*  TIBC  --  234*  FERRITIN >7500*  --     Studies/Results: No results found.  I have reviewed the patient's current medications.  Assessment/Plan: 1 AKI hypoperfusion, cardiorenal, liver also  .  With sob add Lasix 2 CM with low DF 3 ^^LFTs ? Congestion.vs ETOH vs Chemo 4 Anemia 5 CML  P lasix,  Do not lower bp as will lower RBF and impair renal function recovery,K given  LOS: 4 days   Deanne Bedgood L 09/17/2014,11:35 AM

## 2014-09-17 NOTE — Progress Notes (Signed)
[facility] Progress Note Patient Name: Bruce [name] DOB: 07-29-1945 MRN: [id number]   Date of Service  09/17/2014  HPI/Events of Note  Hypokalemia  eICU Interventions  Potassium replaced     Intervention Category Intermediate Interventions: Electrolyte abnormality - evaluation and management  DETERDING,ELIZABETH 09/17/2014, 4:30 AM

## 2014-09-17 NOTE — Progress Notes (Signed)
Subjective:  Denies any chest pain. Still complains of coughing with shortness of breath.  Objective:  Vital Signs in the last 24 hours: Temp:  [98.1 F (36.7 C)-98.8 F (37.1 C)] 98.8 F (37.1 C) (06/07 0800) Pulse Rate:  [77-85] 78 (06/07 0600) Resp:  [5-25] 22 (06/07 0600) BP: (103-118)/(51-75) 118/61 mmHg (06/07 0600) SpO2:  [90 %-95 %] 91 % (06/07 0600) Weight:  [81.7 kg (180 lb 1.9 oz)] 81.7 kg (180 lb 1.9 oz) (06/07 0400)  Intake/Output from previous day: 06/06 0701 - 06/07 0700 In: 1684.9 [P.O.:1310; I.V.:374.9] Out: 1726 [Urine:1725; Stool:1] Intake/Output from this shift:    Physical Exam: Neck: no adenopathy, no carotid bruit and supple, symmetrical, trachea midline Lungs: Decreased breath sound at bases with faint rhonchi and rales Heart: regular rate and rhythm, S1, S2 normal and Soft systolic murmur and S3 gallop noted Abdomen: soft, non-tender; bowel sounds normal; no masses,  no organomegaly Extremities: extremities normal, atraumatic, no cyanosis or edema  Lab Results:  Recent Labs  09/16/14 1255 09/17/14 0330  WBC 14.7* 16.1*  HGB 8.5* 8.1*  PLT 246 250    Recent Labs  09/16/14 0342 09/17/14 0330  NA 137 136  K 4.0 3.4*  CL 108 108  CO2 17* 19*  GLUCOSE 155* 143*  BUN 73* 72*  CREATININE 3.41* 3.05*    Recent Labs  09/14/14 1117  TROPONINI 0.55*   Hepatic Function Panel  Recent Labs  09/17/14 0330  PROT 5.5*  ALBUMIN 2.8*  AST 1122*  ALT 1480*  ALKPHOS 198*  BILITOT 9.5*   No results for input(s): CHOL in the last 72 hours. No results for input(s): PROTIME in the last 72 hours.  Imaging: Imaging results have been reviewed and Dg Chest Port 1 View  09/15/2014   CLINICAL DATA:  Shortness of breath and productive cough for 2 months fourth today, history coronary artery disease, hypertension, smoking, CML  EXAM: PORTABLE CHEST - 1 VIEW  COMPARISON:  Portable exam 0925 hours compared to 09/13/2014  FINDINGS: Enlargement of cardiac  silhouette.  Mediastinal contours and pulmonary vascularity normal.  Slight rotation to the RIGHT.  Lungs clear.  No pleural effusion or pneumothorax.  Bones unremarkable.  IMPRESSION: Enlargement of cardiac silhouette.  No acute abnormalities.   Electronically Signed   By: Lavonia Dana M.D.   On: 09/15/2014 11:32    Cardiac Studies:  Assessment/Plan:  Status postAtypical chest pain with minimally elevated troponin I  secondary to type II MI due to demand ischemia. Mild CAD in the past Severe Nonischemic cardiomyopathy probably secondary to EtOH abuse and decompensated by tachycardia Increased LFTs,etiology multifactorial shock liver/EtOH abuse/questionable chemotherapy Hypertension Chronic myeloid leukemia Multisystem organ failure rule out sepsis/DIC Acute renal injury History of CVA 2 in the past EtOH abuse Tobacco abuse Questionable status post TIA Hypokalemia Plan Add carvedilol 3.25 mg twice daily Replace K  LOS: 4 days    Bruce [name] 09/17/2014, 9:07 AM

## 2014-09-17 NOTE — Clinical Social Work Note (Signed)
Clinical Social Work Assessment  Patient Details  Name: Bruce [name] MRN: [id number] Date of Birth: December 25, 1945  Date of referral:  09/17/14               Reason for consult:  Facility Placement                Permission sought to share information with:    Permission granted to share information::     Name::        Agency::     Relationship::     Contact Information:     Housing/Transportation Living arrangements for the past 2 months:  Apartment Source of Information:  Patient Patient Interpreter Needed:  None Criminal Activity/Legal Involvement Pertinent to Current Situation/Hospitalization:  No - Comment as needed Significant Relationships:    Lives with:  Siblings Do you feel safe going back to the place where you live?   (Pt is requesting placement.) Need for family participation in patient care:     Care giving concerns: Pt feels he needs more assistance than is available at home. Pt lives alone.   Social Worker assessment / plan:  Pt requesting to speak with CSW for assistance with d/c planning. Pt requesting placement following hospital d/c. PT eval requested. CSW has met with pt and will assist with placement once PT recommendations are available.  Employment status:  Retired Nurse, adult PT Recommendations:  Not assessed at this time Information / Referral to community resources:  [place]  Patient/Family's Response to care:  Pt feels he would benefit from [employer] or ALF   Patient/Family's Understanding of and Emotional Response to Diagnosis, Current Treatment, and Prognosis:  Pt reports he has difficulty at home by himself. He is interested in placement.   Emotional Assessment Appearance:  Appears stated age Attitude/Demeanor/Rapport:  Other (cooperative) Affect (typically observed):  Appropriate Orientation:    Alcohol / Substance use:  Not Applicable Psych involvement (Current and /or in the community):  No  (Comment)  Discharge Needs  Concerns to be addressed:  Discharge Planning Concerns Readmission within the last 30 days:  No Current discharge risk:  None Barriers to Discharge:  No Barriers Identified   Leonard Feigel, Randall An, LCSW 09/17/2014, 5:19 PM

## 2014-09-18 ENCOUNTER — Inpatient Hospital Stay (HOSPITAL_COMMUNITY): Payer: Medicare Other

## 2014-09-18 LAB — HEPATIC FUNCTION PANEL
ALBUMIN: 2.8 g/dL — AB (ref 3.5–5.0)
ALT: 1188 U/L — ABNORMAL HIGH (ref 17–63)
AST: 536 U/L — AB (ref 15–41)
Alkaline Phosphatase: 195 U/L — ABNORMAL HIGH (ref 38–126)
BILIRUBIN DIRECT: 7.5 mg/dL — AB (ref 0.1–0.5)
Indirect Bilirubin: 6.7 mg/dL — ABNORMAL HIGH (ref 0.3–0.9)
Total Bilirubin: 14.2 mg/dL — ABNORMAL HIGH (ref 0.3–1.2)
Total Protein: 5.5 g/dL — ABNORMAL LOW (ref 6.5–8.1)

## 2014-09-18 LAB — BASIC METABOLIC PANEL
ANION GAP: 9 (ref 5–15)
BUN: 64 mg/dL — AB (ref 6–20)
CALCIUM: 7.9 mg/dL — AB (ref 8.9–10.3)
CO2: 23 mmol/L (ref 22–32)
Chloride: 108 mmol/L (ref 101–111)
Creatinine, Ser: 2.23 mg/dL — ABNORMAL HIGH (ref 0.61–1.24)
GFR, EST AFRICAN AMERICAN: 33 mL/min — AB (ref >60)
GFR, EST NON AFRICAN AMERICAN: 29 mL/min — AB (ref >60)
GLUCOSE: 155 mg/dL — AB (ref 65–99)
POTASSIUM: 3.6 mmol/L (ref 3.5–5.1)
SODIUM: 140 mmol/L (ref 135–145)

## 2014-09-18 LAB — CBC
HCT: 24.4 % — ABNORMAL LOW (ref 39.0–52.0)
Hemoglobin: 8 g/dL — ABNORMAL LOW (ref 13.0–17.0)
MCH: 29.7 pg (ref 26.0–34.0)
MCHC: 32.8 g/dL (ref 30.0–36.0)
MCV: 90.7 fL (ref 78.0–100.0)
Platelets: 306 10*3/uL (ref 150–400)
RBC: 2.69 MIL/uL — ABNORMAL LOW (ref 4.22–5.81)
RDW: 17.7 % — ABNORMAL HIGH (ref 11.5–15.5)
WBC: 18.3 10*3/uL — AB (ref 4.0–10.5)

## 2014-09-18 MED ORDER — FUROSEMIDE 10 MG/ML IJ SOLN: 80.0000 mg | Freq: Once | INTRAMUSCULAR | Status: DC

## 2014-09-18 MED ORDER — ISOSORB DINITRATE-HYDRALAZINE 20-37.5 MG PO TABS
0.5000 | ORAL_TABLET | Freq: Two times a day (BID) | ORAL | Status: DC
  Administered 2014-09-18 – 2014-09-19 (×2): 0.5 via ORAL
  Filled 2014-09-18 (×3): qty 0.5

## 2014-09-18 MED ORDER — POTASSIUM CHLORIDE CRYS ER 20 MEQ PO TBCR
40.0000 meq | EXTENDED_RELEASE_TABLET | Freq: Once | ORAL | Status: AC
  Administered 2014-09-18: 40 meq via ORAL
  Filled 2014-09-18: qty 2

## 2014-09-18 NOTE — Evaluation (Signed)
Physical Therapy Evaluation Patient Details Name: Bruce [name] MRN: [id number] DOB: 12-Oct-1945 Today's Date: 09/18/2014   History of Present Illness  69 year old jehovah's witness male with history of CML (diagnosed in November 2015), coronary artery disease, nonischemic cardiomyopathy with EF of 40%, tobacco and alcohol use, hypertension, hyperlipidemia, history of TIAs (with some residual facial droop and partial left eye blindness) presented to the ED with lower abdominal pain and substernal chest pain.  Dx of sepsis with lactic acidosis, AKI.  Clinical Impression  Pt admitted with above diagnosis. Pt currently with functional limitations due to the deficits listed below (see PT Problem List). Pt ambulated 6' from bed to recliner with RW, distance limited by dizziness. Orthostatics negative. He'd benefit from ST-SNF for rehab, then consider ALF.  Pt will benefit from skilled PT to increase their independence and safety with mobility to allow discharge to the venue listed below.       Follow Up Recommendations SNF;Supervision for mobility/OOB    Equipment Recommendations  Rolling walker with 5" wheels    Recommendations for Other Services       Precautions / Restrictions Precautions Precautions: Fall Restrictions Weight Bearing Restrictions: No      Mobility  Bed Mobility Overal bed mobility: Modified Independent             General bed mobility comments: used bedrail  Transfers Overall transfer level: Needs assistance Equipment used: Rolling walker (2 wheeled) Transfers: Sit to/from Stand Sit to Stand: Min assist         General transfer comment: verbal cues hand placement, min A to power up, pt reported dizziness in sitting and standing, seated BP 120/80, supine 121/72  Ambulation/Gait Ambulation/Gait assistance: Min guard Ambulation Distance (Feet): 6 Feet Assistive device: Rolling walker (2 wheeled) Gait Pattern/deviations: Step-through pattern;Decreased  stride length   Gait velocity interpretation: Below normal speed for age/gender General Gait Details: distance limited by dizziness  Stairs            Wheelchair Mobility    Modified Rankin (Stroke Patients Only)       Balance Overall balance assessment: Modified Independent                                           Pertinent Vitals/Pain Pain Assessment: No/denies pain    Home Living Family/patient expects to be discharged to:: : [facility] - [facility] Additional Comments: lived alone PTA, used cane to walk, ADLs were becoming difficult per pt    Prior Function Level of Independence: Independent with assistive device(s)               Hand Dominance        Extremity/Trunk Assessment   Upper Extremity Assessment: Overall WFL for tasks assessed           Lower Extremity Assessment: Overall WFL for tasks assessed      Cervical / Trunk Assessment: Normal  Communication   Communication: No difficulties  Cognition Arousal/Alertness: Awake/alert Behavior During Therapy: WFL for tasks assessed/performed Overall Cognitive Status: Within Functional Limits for tasks assessed                      General Comments      Exercises        Assessment/Plan  PT Assessment Patient needs continued PT services  PT Diagnosis Difficulty walking;Generalized weakness   PT Problem List Decreased activity tolerance;Decreased mobility;Decreased knowledge of use of DME  PT Treatment Interventions Gait training;Functional mobility training;Therapeutic activities;Therapeutic exercise;Balance training   PT Goals (Current goals can be found in the Care Plan section) Acute Rehab PT Goals Patient Stated Goal: to be able to get up and walk PT Goal Formulation: With patient Time For Goal Achievement: 10/02/14 Potential to Achieve Goals: Good    Frequency Min 3X/week   Barriers to  discharge        Co-evaluation               End of Session Equipment Utilized During Treatment: Gait belt Activity Tolerance: Treatment limited secondary to medical complications (Comment) (dizzy in sitting) Patient left: in chair;with call bell/phone within reach Nurse Communication: Mobility status         Time: 0093-8182 PT Time Calculation (min) (ACUTE ONLY): 20 min   Charges:   PT Evaluation $Initial PT Evaluation Tier I: 1 Procedure     PT G CodesPhilomena Doheny 09/18/2014, 2:42 PM [phone]

## 2014-09-18 NOTE — Progress Notes (Signed)
CSW met with pt to offer support and assist with d/c planning. PT has recommended ST Rehab. SNF search initiated. CSW will meet with pt on 6/9 to provide SNF bed offers.  Werner Lean LCSW [phone]

## 2014-09-18 NOTE — Progress Notes (Signed)
PULMONARY / CRITICAL CARE MEDICINE   Name: Bruce [name] MRN: [id number] DOB: [birth year]    ADMISSION DATE:  09/13/2014 CONSULTATION DATE:  09/13/14  REFERRING MD :  Dr. Clementeen Graham   CHIEF COMPLAINT:  Weakness   INITIAL PRESENTATION:  69 y/o M, Jehovah's Witness, smoker / ETOH use, with PMH of CML who presented to [facility] ER with reports of worsening SOB.  He was found to have AKI, mild tachycardia and elevated lactic acid.  PCCM consulted for evaluation.    STUDIES:  6/03  CT ABD >> no acute intra-abdominal pathology 6/4 Echo LVEF 10-15%, mild LVH, mild mR, RV dilated, RVSP 48 6/4 RUQ U/S with doppler> normal liver, GB wall thickening, sludge, portal vein pressures increased   SIGNIFICANT EVENTS: 6/03  Admit per TRH with worsening SOB, AKI, mild tachycardia and elevated lactic acid 6/5    milrinone started   VITAL SIGNS: Temp:  [98.4 F (36.9 C)-99.3 F (37.4 C)] 99.3 F (37.4 C) (06/08 0001) Pulse Rate:  [79-92] 85 (06/08 0500) Resp:  [18-28] 26 (06/08 0500) BP: (110-150)/(52-92) 123/62 mmHg (06/08 0500) SpO2:  [90 %-96 %] 91 % (06/08 0500) Weight:  [83.4 kg (183 lb 13.8 oz)] 83.4 kg (183 lb 13.8 oz) (06/08 0400)  2 liters HEMODYNAMICS:     VENTILATOR SETTINGS:     INTAKE / OUTPUT:  Intake/Output Summary (Last 24 hours) at 09/18/14 0651 Last data filed at 09/18/14 0500  Gross per 24 hour  Intake   1336 ml  Output   4501 ml  Net  -3165 ml    PHYSICAL EXAMINATION:  Gen: lying in bed in NAD  HENT: right facial droop,scleral itcterus Eyes: PULM:  CV: + JVD, No rubs, gallops, or murmurs GI: BS+, soft, mildly tender RUQ, no guarding or rebound tenderness Derm: no cyanosis or rash    LABS:  CBC  Recent Labs Lab 09/16/14 1255 09/17/14 0330 09/18/14 0434  WBC 14.7* 16.1* 18.3*  HGB 8.5* 8.1* 8.0*  HCT 26.8* 25.5* 24.4*  PLT 246 250 306   Coag's  Recent Labs Lab 09/13/14 1428 09/15/14 0406 09/16/14 0342 09/17/14 0330  APTT 32  --   --   --   INR  2.35* 4.20* 3.55* 2.33*     BMET  Recent Labs Lab 09/16/14 0342 09/17/14 0330 09/18/14 0434  NA 137 136 140  K 4.0 3.4* 3.6  CL 108 108 108  CO2 17* 19* 23  BUN 73* 72* 64*  CREATININE 3.41* 3.05* 2.23*  GLUCOSE 155* 143* 155*   Electrolytes  Recent Labs Lab 09/15/14 0406 09/16/14 0342 09/17/14 0330 09/18/14 0434  CALCIUM 7.2* 7.2* 7.4* 7.9*  MG 2.3 2.3 2.3  --   PHOS 5.0* 3.5 2.2*  --    Sepsis Markers  Recent Labs Lab 09/13/14 1341  09/13/14 1554 09/14/14 0525 09/14/14 0816 09/14/14 1117 09/15/14 0406  LATICACIDVEN 6.82*  --   --   --  5.4* 6.5*  --   PROCALCITON  --   < > 0.56 1.11  --   --  1.28  < > = values in this interval not displayed. ABG No results for input(s): PHART, PCO2ART, PO2ART in the last 168 hours.   Liver Enzymes  Recent Labs Lab 09/16/14 0342 09/17/14 0330 09/18/14 0434  AST 2194* 1122* 536*  ALT 1888* 1480* 1188*  ALKPHOS 202* 198* 195*  BILITOT 8.0* 9.5* 14.2*  ALBUMIN 3.0* 2.8* 2.8*   Cardiac Enzymes  Recent Labs Lab 09/13/14 2300 09/14/14 0525 09/14/14  1117  TROPONINI 0.49* 0.47* 0.55*   Glucose No results for input(s): GLUCAP in the last 168 hours.  Imaging 6/3 CXR > mild LVH, no pulmonary infiltrate,  6/5 CXR > cardiomegaly, no acute process 6/8: CXR> no acute    ASSESSMENT / PLAN:  PULMONARY OETT A: Dyspnea - improved since initiation of milrinone Cough - recent bronchitis with admission  P:   Pulmonary hygiene:  IS, mobilize as able Oxygen as needed to support sats > 90%  Delsym for cough   CARDIOVASCULAR CVL > Acute decompensated heart failure > LVEF 10-15% (worse than prior echo)   Chest Pain > resolved Elevated Troponin - demand ischemia most likely, but no EKG changes, suspect elevated Cr related  Hx HTN, HLD, CAD,  P:  Lasix for continued diuresis (one dose today as cutting milrinone and added b-blocker) Continue Milrinone-->will cut in half  Coreg restarted by Cards Hold  lisinopril Hold home plavix,  Tele monitoring  Cardiology following  RENAL A:   Anion Gap Metabolic Acidosis / Lactic Acidosis>>> resolved, AKI AKI> Scr down 6/7 P:   Monitor BMET and UOP Replace electrolytes as needed Nephrology following   GASTROINTESTINAL A:   Abdominal Pain with Elevated Transaminitis and liver failure - no clear source, could this be due to heart failure? P:   Continue to trend LFT's >>> Transaminase values trending downward- TBili increased likely lagging LFTs in AM F/U infectious serologies   HEMATOLOGIC A:   CML - followed by Dr. Lisabeth Devoid Witness  Mild Anemia -  Without bleeding Coagulopathy P:  PT/INR in AM Continue Vitamin K for coagulopathy Heme/Onc consulted and following Defer CML regimen to onc Trend CBC  SCD's for DVT prophylaxis   INFECTIOUS A:   Leukocytosis, continued to increase ? CML Immunocompromised State   P:   Trend fever curve BCx2 6/03 >> NGTD UA 6/03 >> negative UC 6/03 >> NGTD Sputum 6/03 >>  Leptospira Ab 6/4 > EBV PCR 6/4 > RMSF 6/4 >negative Lyme 6/4 >  Vanco, start date 6/3 >> 6/4 Zosyn, start date 6/3 >> 6/5  ENDOCRINE A:   Mild Hyperglycemia  P:   Monitor glucose, if consistently > 180, add SSI   NEUROLOGIC A:   Hx CVA, Bells Palsy with residual facial droop and L eye blindness Cane Dependent at Baseline Mild encephalopathy> multifactorial in setting of multi-organ failure P:   Minimize sedation  Closely monitor neuro status  FAMILY  - Updates:  Sister stated she would be in 6/7 to see patient.  - Inter-disciplinary family meet or Palliative Care meeting due by:  6/10   GLOBAL: Continues to look a little better. Will cont inotrope and diuresis. Hope we can wean off in next 24hrs.    Erick Colace ACNP-BC [place] Pager # [phone] OR # [phone] if no answer      09/18/2014, 6:51 AM

## 2014-09-18 NOTE — Progress Notes (Signed)
Subjective: Interval History: has complaints cough but better.  Objective: Vital signs in last 24 hours: Temp:  [97.2 F (36.2 C)-99.3 F (37.4 C)] 97.2 F (36.2 C) (06/08 0800) Pulse Rate:  [79-92] 85 (06/08 1000) Resp:  [16-28] 19 (06/08 1000) BP: (110-139)/(60-82) 129/63 mmHg (06/08 1000) SpO2:  [90 %-96 %] 94 % (06/08 1000) Weight:  [83.4 kg (183 lb 13.8 oz)] 83.4 kg (183 lb 13.8 oz) (06/08 0400) Weight change: 1.7 kg (3 lb 12 oz)  Intake/Output from previous day: 06/07 0701 - 06/08 0700 In: 1348.6 [P.O.:1210; I.V.:138.6] Out: 4501 [Urine:4500; Stool:1] Intake/Output this shift: Total I/O In: 12.6 [I.V.:12.6] Out: 450 [Urine:450]  General appearance: alert, cooperative and ?mild confusion Resp: diminished breath sounds bilaterally and rales bibasilar Cardio: S1, S2 normal and systolic murmur: holosystolic 2/6, blowing at apex GI: soft, liver down 6 cm, pos bs Extremities: edema 2+ presacral.  Lab Results:  Recent Labs  09/17/14 0330 09/18/14 0434  WBC 16.1* 18.3*  HGB 8.1* 8.0*  HCT 25.5* 24.4*  PLT 250 306   BMET:  Recent Labs  09/17/14 0330 09/18/14 0434  NA 136 140  K 3.4* 3.6  CL 108 108  CO2 19* 23  GLUCOSE 143* 155*  BUN 72* 64*  CREATININE 3.05* 2.23*  CALCIUM 7.4* 7.9*   No results for input(s): PTH in the last 72 hours. Iron Studies:  Recent Labs  09/16/14 1255  IRON 217*  TIBC 234*    Studies/Results: Dg Chest Port 1 View  09/18/2014   CLINICAL DATA:  Pulmonary edema  EXAM: PORTABLE CHEST - 1 VIEW  COMPARISON:  09/15/2014  FINDINGS: There is mild unchanged cardiomegaly. The lungs are clear. There are no large effusions. No pneumothorax.  IMPRESSION: Unchanged cardiomegaly   Electronically Signed   By: Andreas Newport M.D.   On: 09/18/2014 06:57    I have reviewed the patient's current medications.  Assessment/Plan: 1 AKI improving solute. Vol improving with diuresis Lasix and post injury.  Course c/w acute renal injury more than  cardiorenal.  ? HRS (ETOH) or toxic with chemo.  Still vol xs,cut Lasix tomorrow.  Acid base better, cut bicarb also if better 2 CM  ? Chemo vs etoh 3 CML 4 Anemia  5 ^^LFTs, ? Congestion vs ETOH P lasix, bicarb, follow vol  And bp.   LOS: 5 days   Carold Eisner L 09/18/2014,11:37 AM

## 2014-09-18 NOTE — Progress Notes (Signed)
Subjective:  States feeling better.  Denies any chest pain.  States cough is improved  Objective:  Vital Signs in the last 24 hours: Temp:  [97.2 F (36.2 C)-99.3 F (37.4 C)] 98.8 F (37.1 C) (06/08 1200) Pulse Rate:  [79-92] 82 (06/08 1500) Resp:  [16-27] 23 (06/08 1500) BP: (110-132)/(58-82) 119/58 mmHg (06/08 1500) SpO2:  [90 %-98 %] 95 % (06/08 1500) Weight:  [83.4 kg (183 lb 13.8 oz)] 83.4 kg (183 lb 13.8 oz) (06/08 0400)  Intake/Output from previous day: 06/07 0701 - 06/08 0700 In: 1348.6 [P.O.:1210; I.V.:138.6] Out: 4501 [Urine:4500; Stool:1] Intake/Output from this shift: Total I/O In: 636.2 [P.O.:600; I.V.:36.2] Out: 1400 [Urine:1400]  Physical Exam: Neck: no adenopathy, no carotid bruit, no JVD and supple, symmetrical, trachea midline Lungs: decreased breath sounds at bases with faint rhonchi and rales Heart: regular rate and rhythm, S1, S2 normal and systolic murmur and S3 gallop noted Abdomen: soft, non-tender; bowel sounds normal; no masses,  no organomegaly Extremities: extremities normal, atraumatic, no cyanosis or edema  Lab Results:  Recent Labs  09/17/14 0330 09/18/14 0434  WBC 16.1* 18.3*  HGB 8.1* 8.0*  PLT 250 306    Recent Labs  09/17/14 0330 09/18/14 0434  NA 136 140  K 3.4* 3.6  CL 108 108  CO2 19* 23  GLUCOSE 143* 155*  BUN 72* 64*  CREATININE 3.05* 2.23*   No results for input(s): TROPONINI in the last 72 hours.  Invalid input(s): CK, MB Hepatic Function Panel  Recent Labs  09/18/14 0434  PROT 5.5*  ALBUMIN 2.8*  AST 536*  ALT 1188*  ALKPHOS 195*  BILITOT 14.2*  BILIDIR 7.5*  IBILI 6.7*   No results for input(s): CHOL in the last 72 hours. No results for input(s): PROTIME in the last 72 hours.  Imaging: Imaging results have been reviewed and Dg Chest Port 1 View  09/18/2014   CLINICAL DATA:  Pulmonary edema  EXAM: PORTABLE CHEST - 1 VIEW  COMPARISON:  09/15/2014  FINDINGS: There is mild unchanged cardiomegaly. The  lungs are clear. There are no large effusions. No pneumothorax.  IMPRESSION: Unchanged cardiomegaly   Electronically Signed   By: Andreas Newport M.D.   On: 09/18/2014 06:57    Cardiac Studies:  Assessment/Plan:  Status postAtypical chest pain with minimally elevated troponin I secondary to type II MI due to demand ischemia. Mild CAD in the past Severe Nonischemic cardiomyopathy probably secondary to EtOH abuse and decompensated by tachycardia Increased LFTs,etiology multifactorial shock liver/EtOH abuse/questionable chemotherapy Hypertension Chronic myeloid leukemia Multisystem organ failure rule out sepsis/DIC Acute renal injury History of CVA 2 in the past EtOH abuse Tobacco abuse Questionable status post TIA Plan Start BiDil half tablet twice daily.  Will up titrate as blood pressure tolerates.   LOS: 5 days    Bruce [name] 09/18/2014, 3:57 PM

## 2014-09-18 NOTE — Progress Notes (Signed)
Date:  September 18, 2014 U.R. performed for needs and level of care. Iv primacor infusion due to heart failure. Will continue to follow for Case Management needs.  Velva Harman, RN, BSN, [place]   [phone]

## 2014-09-19 LAB — BASIC METABOLIC PANEL
Anion gap: 7 (ref 5–15)
BUN: 62 mg/dL — ABNORMAL HIGH (ref 6–20)
CO2: 26 mmol/L (ref 22–32)
Calcium: 7.9 mg/dL — ABNORMAL LOW (ref 8.9–10.3)
Chloride: 104 mmol/L (ref 101–111)
Creatinine, Ser: 2.04 mg/dL — ABNORMAL HIGH (ref 0.61–1.24)
GFR calc Af Amer: 37 mL/min — ABNORMAL LOW (ref >60)
GFR, EST NON AFRICAN AMERICAN: 32 mL/min — AB (ref >60)
Glucose, Bld: 175 mg/dL — ABNORMAL HIGH (ref 65–99)
POTASSIUM: 3.1 mmol/L — AB (ref 3.5–5.1)
SODIUM: 137 mmol/L (ref 135–145)

## 2014-09-19 LAB — CBC WITH DIFFERENTIAL/PLATELET
Basophils Absolute: 0.1 10*3/uL (ref 0.0–0.1)
Basophils Relative: 0 % (ref 0–1)
Eosinophils Absolute: 0.3 10*3/uL (ref 0.0–0.7)
Eosinophils Relative: 2 % (ref 0–5)
HEMATOCRIT: 23.2 % — AB (ref 39.0–52.0)
HEMOGLOBIN: 7.3 g/dL — AB (ref 13.0–17.0)
Lymphocytes Relative: 14 % (ref 12–46)
Lymphs Abs: 2.1 10*3/uL (ref 0.7–4.0)
MCH: 28.5 pg (ref 26.0–34.0)
MCHC: 31.5 g/dL (ref 30.0–36.0)
MCV: 90.6 fL (ref 78.0–100.0)
Monocytes Absolute: 2.6 10*3/uL — ABNORMAL HIGH (ref 0.1–1.0)
Monocytes Relative: 17 % — ABNORMAL HIGH (ref 3–12)
NEUTROS PCT: 67 % (ref 43–77)
Neutro Abs: 10.4 10*3/uL — ABNORMAL HIGH (ref 1.7–7.7)
Platelets: 333 10*3/uL (ref 150–400)
RBC: 2.56 MIL/uL — AB (ref 4.22–5.81)
RDW: 18.6 % — ABNORMAL HIGH (ref 11.5–15.5)
WBC: 15.4 10*3/uL — ABNORMAL HIGH (ref 4.0–10.5)

## 2014-09-19 LAB — CULTURE, BLOOD (ROUTINE X 2)
Culture: NO GROWTH
Culture: NO GROWTH

## 2014-09-19 LAB — RETICULOCYTES
RBC.: 2.56 MIL/uL — ABNORMAL LOW (ref 4.22–5.81)
RETIC COUNT ABSOLUTE: 153.6 10*3/uL (ref 19.0–186.0)
RETIC CT PCT: 6 % — AB (ref 0.4–3.1)

## 2014-09-19 LAB — HEPATIC FUNCTION PANEL
ALK PHOS: 192 U/L — AB (ref 38–126)
ALT: 799 U/L — ABNORMAL HIGH (ref 17–63)
AST: 236 U/L — ABNORMAL HIGH (ref 15–41)
Albumin: 2.8 g/dL — ABNORMAL LOW (ref 3.5–5.0)
BILIRUBIN INDIRECT: 5.2 mg/dL — AB (ref 0.3–0.9)
Bilirubin, Direct: 6.9 mg/dL — ABNORMAL HIGH (ref 0.1–0.5)
TOTAL PROTEIN: 5.9 g/dL — AB (ref 6.5–8.1)
Total Bilirubin: 12.1 mg/dL — ABNORMAL HIGH (ref 0.3–1.2)

## 2014-09-19 LAB — PROTIME-INR
INR: 1.43 (ref 0.00–1.49)
Prothrombin Time: 17.6 seconds — ABNORMAL HIGH (ref 11.6–15.2)

## 2014-09-19 LAB — LACTATE DEHYDROGENASE: LDH: 497 U/L — AB (ref 98–192)

## 2014-09-19 LAB — HEPATITIS PANEL, ACUTE
HCV Ab: <0.1 s/co ratio (ref 0.0–0.9)
HEP B S AG: NEGATIVE
Hep A IgM: NEGATIVE
Hep B C IgM: NEGATIVE

## 2014-09-19 LAB — SAVE SMEAR

## 2014-09-19 MED ORDER — POTASSIUM CHLORIDE CRYS ER 20 MEQ PO TBCR
40.0000 meq | EXTENDED_RELEASE_TABLET | Freq: Once | ORAL | Status: AC
  Administered 2014-09-19: 40 meq via ORAL
  Filled 2014-09-19: qty 2

## 2014-09-19 MED ORDER — FERROUS GLUCONATE 324 (38 FE) MG PO TABS
324.0000 mg | ORAL_TABLET | Freq: Three times a day (TID) | ORAL | Status: DC
  Filled 2014-09-19 (×3): qty 1

## 2014-09-19 MED ORDER — DARBEPOETIN ALFA 60 MCG/0.3ML IJ SOSY
60.0000 ug | PREFILLED_SYRINGE | Freq: Every day | INTRAMUSCULAR | Status: DC
  Administered 2014-09-19 – 2014-09-21 (×2): 60 ug via SUBCUTANEOUS
  Filled 2014-09-19 (×8): qty 0.3

## 2014-09-19 MED ORDER — FUROSEMIDE 40 MG PO TABS
80.0000 mg | ORAL_TABLET | Freq: Two times a day (BID) | ORAL | Status: DC
  Administered 2014-09-19 – 2014-09-22 (×6): 80 mg via ORAL
  Filled 2014-09-19 (×6): qty 2

## 2014-09-19 MED ORDER — ISOSORB DINITRATE-HYDRALAZINE 20-37.5 MG PO TABS
0.5000 | ORAL_TABLET | Freq: Three times a day (TID) | ORAL | Status: DC
  Administered 2014-09-19 – 2014-09-22 (×8): 0.5 via ORAL
  Filled 2014-09-19: qty 1
  Filled 2014-09-19: qty 0.5
  Filled 2014-09-19 (×2): qty 1
  Filled 2014-09-19: qty 0.5
  Filled 2014-09-19: qty 1
  Filled 2014-09-19 (×2): qty 0.5
  Filled 2014-09-19: qty 1
  Filled 2014-09-19: qty 0.5
  Filled 2014-09-19: qty 1

## 2014-09-19 NOTE — Care Management Note (Signed)
Case Management Note  Patient Details  Name: Bruce [name] MRN: [id number] Date of Birth: February 18, 1946  Subjective/Objective:                 Heart failure and resp distress   Action/Plan:  follow for needs   Expected Discharge Date:   (unknown)          [id number]     Expected Discharge Plan:  Home/Self Care  In-House Referral:  Chaplain  Discharge planning Services  CM Consult  Post Acute Care Choice:  NA Choice offered to:  NA  DME Arranged:  N/A DME Agency:  NA  HH Arranged:  NA HH Agency:  NA  Status of Service:     Medicare Important Message Given:    Date Medicare IM Given:    Medicare IM give by:    Date Additional Medicare IM Given:    Additional Medicare Important Message give by:     If discussed at [place] of Stay Meetings, dates discussed:  [id number]  Additional Comments:  [name], RN 09/19/2014, 10:13 AM

## 2014-09-19 NOTE — Progress Notes (Signed)
Subjective:  Denies any chest pain states breathing slowly improving  Objective:  Vital Signs in the last 24 hours: Temp:  [97.7 F (36.5 C)-98.8 F (37.1 C)] 97.7 F (36.5 C) (06/09 0800) Pulse Rate:  [77-87] 85 (06/09 0650) Resp:  [14-30] 30 (06/09 0650) BP: (98-126)/(44-86) 100/70 mmHg (06/09 0647) SpO2:  [87 %-98 %] 97 % (06/09 0650) Weight:  [80.3 kg (177 lb 0.5 oz)] 80.3 kg (177 lb 0.5 oz) (06/09 0500)  Intake/Output from previous day: 06/08 0701 - 06/09 0700 In: 978.2 [P.O.:911; I.V.:67.2] Out: 4600 [Urine:4600] Intake/Output from this shift:    Physical Exam: Neck: no adenopathy, no carotid bruit, no JVD and supple, symmetrical, trachea midline Lungs: Decreased breath sound at bases with occasional rhonchi Heart: regular rate and rhythm, S1, S2 normal and Soft systolic murmur noted Abdomen: soft, non-tender; bowel sounds normal; no masses,  no organomegaly Extremities: extremities normal, atraumatic, no cyanosis or edema  Lab Results:  Recent Labs  09/18/14 0434 09/19/14 0400  WBC 18.3* 15.4*  HGB 8.0* 7.3*  PLT 306 333    Recent Labs  09/18/14 0434 09/19/14 0340  NA 140 137  K 3.6 3.1*  CL 108 104  CO2 23 26  GLUCOSE 155* 175*  BUN 64* 62*  CREATININE 2.23* 2.04*   No results for input(s): TROPONINI in the last 72 hours.  Invalid input(s): CK, MB Hepatic Function Panel  Recent Labs  09/19/14 0930  PROT 5.9*  ALBUMIN 2.8*  AST 236*  ALT 799*  ALKPHOS 192*  BILITOT 12.1*  BILIDIR 6.9*  IBILI 5.2*   No results for input(s): CHOL in the last 72 hours. No results for input(s): PROTIME in the last 72 hours.  Imaging: Imaging results have been reviewed and Dg Chest Port 1 View  09/18/2014   CLINICAL DATA:  Pulmonary edema  EXAM: PORTABLE CHEST - 1 VIEW  COMPARISON:  09/15/2014  FINDINGS: There is mild unchanged cardiomegaly. The lungs are clear. There are no large effusions. No pneumothorax.  IMPRESSION: Unchanged cardiomegaly    Electronically Signed   By: Andreas Newport M.D.   On: 09/18/2014 06:57    Cardiac Studies:  Assessment/Plan:  Status postAtypical chest pain with minimally elevated troponin I secondary to type II MI due to demand ischemia. Mild CAD in the past Severe Nonischemic cardiomyopathy probably secondary to EtOH abuse and decompensated by tachycardia Increased LFTs,etiology multifactorial shock liver/EtOH abuse/questionable chemotherapy Hypertension Chronic myeloid leukemia Multisystem organ failure rule out sepsis/DIC Acute renal injury History of CVA 2 in the past EtOH abuse Tobacco abuse Questionable status post TIA Anemia of chronic disease rule out GI loss Plan  wean off milrinone drip Increase BiDil as per orders Further management as per CCM Consider GI consult.  LOS: 6 days    [name] 09/19/2014, 11:35 AM

## 2014-09-19 NOTE — Progress Notes (Signed)
PULMONARY / CRITICAL CARE MEDICINE   Name: Bruce [name] MRN: [id number] DOB: 08/07/1945    ADMISSION DATE:  09/13/2014 CONSULTATION DATE:  09/13/14  REFERRING MD :  Dr. Clementeen Graham   CHIEF COMPLAINT:  Weakness   INITIAL PRESENTATION:  69 y/o M, Jehovah's Witness, smoker / ETOH use, with PMH of CML who presented to [facility] ER with reports of worsening SOB.  He was found to have AKI, mild tachycardia and elevated lactic acid.  PCCM consulted for evaluation.    STUDIES:  6/03  CT ABD >> no acute intra-abdominal pathology 6/4 Echo LVEF 10-15%, mild LVH, mild mR, RV dilated, RVSP 48 6/4 RUQ U/S with doppler> normal liver, GB wall thickening, sludge, portal vein pressures increased   SIGNIFICANT EVENTS: 6/03  Admit per TRH with worsening SOB, AKI, mild tachycardia and elevated lactic acid 6/5  milrinone started for working dx of cardiogenic shock w/ MODS 6/8 milrinone gtt reduced by half 6/9 milrinone gtt off   VITAL SIGNS: Temp:  [97.2 F (36.2 C)-98.8 F (37.1 C)] 98.6 F (37 C) (06/09 0319) Pulse Rate:  [77-88] 81 (06/08 2300) Resp:  [14-26] 14 (06/08 2300) BP: (98-132)/(44-86) 125/65 mmHg (06/08 2300) SpO2:  [87 %-98 %] 98 % (06/08 2300)  Room air  HEMODYNAMICS:     VENTILATOR SETTINGS:     INTAKE / OUTPUT:  Intake/Output Summary (Last 24 hours) at 09/19/14 3546 Last data filed at 09/19/14 0319  Gross per 24 hour  Intake 990.76 ml  Output   4100 ml  Net -3109.24 ml    PHYSICAL EXAMINATION:  Gen: Sitting up in bed, smiling in NAD  HEENT: Pupils 82mm brisk and reactive, scleral itcterus Neuro: slurred speech, right facial droop. Strenght 5/5 UE and LE, No drift PULM: Crackles in the bases bilaterally CV: + JVD, No rubs, gallops, or murmurs, no carotid bruits GI: BS+, soft, mildly tender RUQ, no guarding or rebound tenderness Derm: no cyanosis or rash  LABS:  CBC  Recent Labs Lab 09/16/14 1255 09/17/14 0330 09/18/14 0434  WBC 14.7* 16.1* 18.3*  HGB 8.5*  8.1* 8.0*  HCT 26.8* 25.5* 24.4*  PLT 246 250 306   Coag's  Recent Labs Lab 09/13/14 1428  09/16/14 0342 09/17/14 0330 09/19/14 0340  APTT 32  --   --   --   --   INR 2.35*  < > 3.55* 2.33* 1.43  < > = values in this interval not displayed.   BMET  Recent Labs Lab 09/17/14 0330 09/18/14 0434 09/19/14 0340  NA 136 140 137  K 3.4* 3.6 3.1*  CL 108 108 104  CO2 19* 23 26  BUN 72* 64* 62*  CREATININE 3.05* 2.23* 2.04*  GLUCOSE 143* 155* 175*   Electrolytes  Recent Labs Lab 09/15/14 0406 09/16/14 0342 09/17/14 0330 09/18/14 0434 09/19/14 0340  CALCIUM 7.2* 7.2* 7.4* 7.9* 7.9*  MG 2.3 2.3 2.3  --   --   PHOS 5.0* 3.5 2.2*  --   --    Sepsis Markers  Recent Labs Lab 09/13/14 1341  09/13/14 1554 09/14/14 0525 09/14/14 0816 09/14/14 1117 09/15/14 0406  LATICACIDVEN 6.82*  --   --   --  5.4* 6.5*  --   PROCALCITON  --   < > 0.56 1.11  --   --  1.28  < > = values in this interval not displayed. ABG No results for input(s): PHART, PCO2ART, PO2ART in the last 168 hours.   Liver Enzymes  Recent Labs Lab  09/16/14 0342 09/17/14 0330 09/18/14 0434  AST 2194* 1122* 536*  ALT 1888* 1480* 1188*  ALKPHOS 202* 198* 195*  BILITOT 8.0* 9.5* 14.2*  ALBUMIN 3.0* 2.8* 2.8*   Cardiac Enzymes  Recent Labs Lab 09/13/14 2300 09/14/14 0525 09/14/14 1117  TROPONINI 0.49* 0.47* 0.55*   Glucose No results for input(s): GLUCAP in the last 168 hours.  Imaging 6/3 CXR > mild LVH, no pulmonary infiltrate,  6/5 CXR > cardiomegaly, no acute process 6/8: CXR> no acute    ASSESSMENT / PLAN:  PULMONARY OETT A: Dyspnea - improved since initiation of milrinone Cough - recent bronchitis with admission  P:   Pulmonary hygiene:  IS, Physical therapy following, continue mobilization Oxygen as needed to support sats > 90%  Delsym for cough   CARDIOVASCULAR CVL > Acute decompensated heart failure > LVEF 10-15% (worse than prior echo)   Chest Pain >  resolved Elevated Troponin - demand ischemia most likely, but no EKG changes, suspect elevated Cr related  Hx HTN, HLD, CAD,  P:  Continue PO lasix  D/C milrinone gtt Bidil restarted by Cards Coreg  Hold lisinopril Continue Plavix hold d/t worsening anemia Tele monitoring  Cardiology following  RENAL A:   Anion Gap Metabolic Acidosis / Lactic Acidosis>>> resolved, AKI AKI> Scr down 6/7 Hypokalemia P:   D/C bicarb tabs Replace K+  Monitor BMET and UOP Replace electrolytes as needed Nephrology following Continue lasix  GASTROINTESTINAL A:   Abdominal Pain with Elevated Transaminitis and liver failure - shock liver d/t heart failure Hyperbilirubinemia>>>Questional source- labs pending Hepatitis panel negative, RMSF neg, EBV neg P:   Will hold off on Hida scan as exam improving and obstruction unlikely Lyme pending  HEMATOLOGIC A:   CML - followed by Dr. Lisabeth Devoid Witness  Worsening Anemia Coagulopathy>Resolved P:  Peripheral smear, schistocytes, retic count, LDH PT/INR daily and trend D/C Vitamin K -coagulopathy resolved Heme/Onc consulted and following Defer CML regimen to onc Trend CBC  SCD's for DVT prophylaxis  Will defer chemoprophylaxis in light of anemia darbepoetin alfa started  INFECTIOUS A:   Leukocytosis Immunocompromised State   P:   Repeat Blood Cx 6/9 Trend fever curve Sputum 6/03 >> not collected Leptospira Ab 6/4 >in process EBV PCR 6/4 >negative RMSF 6/4 >negative Lyme 6/4 > negative  Vanco, start date 6/3 >> 6/4 Zosyn, start date 6/3 >> 6/5  ENDOCRINE A:   Mild Hyperglycemia  P:   Monitor glucose, if consistently > 180, add SSI    NEUROLOGIC A:   Hx CVA, Bells 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  - Inter-disciplinary family meet or Palliative Care meeting due by:  6/10   GLOBAL: Patient states he continues to "feel better". Continued leukocytosis, reevaluate LFTs and bilirubin. Clinical exam improving, so unlikely gall bladder obstruction. Tolerated dose decrease in milrinone 6/8, will d/c milrinone 6/9. Worsening anemia with unclear source-will continue to monitor closely as patient is a Jehovah's witness and start darbepoetin alfa. Monitor for signs of hemolysis Patient states he would consider blood transfusion in future if needed. If continued improvement will plan for sign out to [facility] 6/10.        09/19/2014, 6:32 AM

## 2014-09-19 NOTE — Progress Notes (Signed)
Subjective: Interval History: has no complaint, getting better, less SOB,cough.  Objective: Vital signs in last 24 hours: Temp:  [97.7 F (36.5 C)-98.8 F (37.1 C)] 97.7 F (36.5 C) (06/09 0800) Pulse Rate:  [77-87] 85 (06/09 0650) Resp:  [14-30] 30 (06/09 0650) BP: (98-126)/(44-86) 100/70 mmHg (06/09 0647) SpO2:  [87 %-98 %] 97 % (06/09 0650) Weight:  [80.3 kg (177 lb 0.5 oz)] 80.3 kg (177 lb 0.5 oz) (06/09 0500) Weight change: -3.1 kg (-6 lb 13.4 oz)  Intake/Output from previous day: 06/08 0701 - 06/09 0700 In: 978.2 [P.O.:911; I.V.:67.2] Out: 4600 [Urine:4600] Intake/Output this shift:    General appearance: cooperative and slowed mentation Resp: diminished breath sounds bilaterally and rales bibasilar Cardio: S1, S2 normal and systolic murmur: holosystolic 2/6, blowing at apex GI: pos bs, liver down 6 cm Extremities: edema 1+ pre sacral  Lab Results:  Recent Labs  09/18/14 0434 09/19/14 0400  WBC 18.3* 15.4*  HGB 8.0* 7.3*  HCT 24.4* 23.2*  PLT 306 333   BMET:  Recent Labs  09/18/14 0434 09/19/14 0340  NA 140 137  K 3.6 3.1*  CL 108 104  CO2 23 26  GLUCOSE 155* 175*  BUN 64* 62*  CREATININE 2.23* 2.04*  CALCIUM 7.9* 7.9*   No results for input(s): PTH in the last 72 hours. Iron Studies:  Recent Labs  09/16/14 1255  IRON 217*  TIBC 234*    Studies/Results: Dg Chest Port 1 View  09/18/2014   CLINICAL DATA:  Pulmonary edema  EXAM: PORTABLE CHEST - 1 VIEW  COMPARISON:  09/15/2014  FINDINGS: There is mild unchanged cardiomegaly. The lungs are clear. There are no large effusions. No pneumothorax.  IMPRESSION: Unchanged cardiomegaly   Electronically Signed   By: Andreas Newport M.D.   On: 09/18/2014 06:57    I have reviewed the patient's current medications.  Assessment/Plan: 1 AKI suspect liver or toxic injury with some component cardiorenal but recovery not typical of jus cardiorena. Diuresing, acid/base ok. K being replete. Can lower  furosemide 2 CM per card 3 CML 4 Anemia 5 ETOH 6 CVA P lower Lasix, give K, will s/o for now.      LOS: 6 days   Bekah Igoe L 09/19/2014,11:59 AM

## 2014-09-20 LAB — CBC WITH DIFFERENTIAL/PLATELET
BASOS PCT: 0 % (ref 0–1)
Basophils Absolute: 0.1 10*3/uL (ref 0.0–0.1)
Eosinophils Absolute: 0.2 10*3/uL (ref 0.0–0.7)
Eosinophils Relative: 2 % (ref 0–5)
HCT: 24.1 % — ABNORMAL LOW (ref 39.0–52.0)
Hemoglobin: 7.9 g/dL — ABNORMAL LOW (ref 13.0–17.0)
Lymphocytes Relative: 20 % (ref 12–46)
Lymphs Abs: 2.4 10*3/uL (ref 0.7–4.0)
MCH: 30 pg (ref 26.0–34.0)
MCHC: 32.8 g/dL (ref 30.0–36.0)
MCV: 91.6 fL (ref 78.0–100.0)
MONO ABS: 2.2 10*3/uL — AB (ref 0.1–1.0)
MONOS PCT: 19 % — AB (ref 3–12)
NEUTROS ABS: 6.9 10*3/uL (ref 1.7–7.7)
Neutrophils Relative %: 59 % (ref 43–77)
Platelets: 369 10*3/uL (ref 150–400)
RBC: 2.63 MIL/uL — ABNORMAL LOW (ref 4.22–5.81)
RDW: 20.3 % — ABNORMAL HIGH (ref 11.5–15.5)
WBC: 11.8 10*3/uL — AB (ref 4.0–10.5)

## 2014-09-20 LAB — BASIC METABOLIC PANEL
ANION GAP: 9 (ref 5–15)
BUN: 52 mg/dL — ABNORMAL HIGH (ref 6–20)
CO2: 27 mmol/L (ref 22–32)
CREATININE: 1.83 mg/dL — AB (ref 0.61–1.24)
Calcium: 8.5 mg/dL — ABNORMAL LOW (ref 8.9–10.3)
Chloride: 106 mmol/L (ref 101–111)
GFR calc Af Amer: 42 mL/min — ABNORMAL LOW (ref >60)
GFR calc non Af Amer: 36 mL/min — ABNORMAL LOW (ref >60)
Glucose, Bld: 130 mg/dL — ABNORMAL HIGH (ref 65–99)
POTASSIUM: 3.8 mmol/L (ref 3.5–5.1)
Sodium: 142 mmol/L (ref 135–145)

## 2014-09-20 LAB — HEPATIC FUNCTION PANEL
ALBUMIN: 3 g/dL — AB (ref 3.5–5.0)
ALT: 619 U/L — AB (ref 17–63)
AST: 156 U/L — ABNORMAL HIGH (ref 15–41)
Alkaline Phosphatase: 184 U/L — ABNORMAL HIGH (ref 38–126)
BILIRUBIN TOTAL: 8 mg/dL — AB (ref 0.3–1.2)
Bilirubin, Direct: 4.1 mg/dL — ABNORMAL HIGH (ref 0.1–0.5)
Indirect Bilirubin: 3.9 mg/dL — ABNORMAL HIGH (ref 0.3–0.9)
TOTAL PROTEIN: 6.1 g/dL — AB (ref 6.5–8.1)

## 2014-09-20 LAB — PROTIME-INR
INR: 1.25 (ref 0.00–1.49)
PROTHROMBIN TIME: 15.8 s — AB (ref 11.6–15.2)

## 2014-09-20 LAB — BRAIN NATRIURETIC PEPTIDE: B Natriuretic Peptide: 2397.1 pg/mL — ABNORMAL HIGH (ref 0.0–100.0)

## 2014-09-20 MED ORDER — SENNOSIDES-DOCUSATE SODIUM 8.6-50 MG PO TABS
2.0000 | ORAL_TABLET | Freq: Two times a day (BID) | ORAL | Status: DC
  Administered 2014-09-20 – 2014-09-22 (×4): 2 via ORAL
  Filled 2014-09-20 (×4): qty 2

## 2014-09-20 MED ORDER — POLYETHYLENE GLYCOL 3350 17 G PO PACK
17.0000 g | PACK | Freq: Every day | ORAL | Status: DC
  Administered 2014-09-20 – 2014-09-22 (×3): 17 g via ORAL
  Filled 2014-09-20 (×3): qty 1

## 2014-09-20 NOTE — Progress Notes (Signed)
Subjective:  Denies any chest pain breathing improved diuresing well. Tolerating Coreg and BiDil. Patient is off final drops. Renal function and hepatic function improving slowly   Objective:  Vital Signs in the last 24 hours: Temp:  [97.2 F (36.2 C)-98.4 F (36.9 C)] 98.2 F (36.8 C) (06/10 1505) Pulse Rate:  [69-87] 69 (06/10 1505) Resp:  [13-27] 21 (06/10 1505) BP: (99-116)/(52-65) 99/59 mmHg (06/10 1505) SpO2:  [96 %-99 %] 98 % (06/10 1505) Weight:  [74.1 kg (163 lb 5.8 oz)-77.8 kg (171 lb 8.3 oz)] 74.1 kg (163 lb 5.8 oz) (06/10 1505)  Intake/Output from previous day: 06/09 0701 - 06/10 0700 In: 1065.3 [P.O.:1040; I.V.:25.3] Out: 4250 [Urine:4250] Intake/Output from this shift:    Physical Exam: Neck: no adenopathy, no carotid bruit and supple, symmetrical, trachea midline Lungs: Decreased breath sound at bases Heart: regular rate and rhythm, S1, S2 normal and Soft systolic murmur noted Abdomen: soft, non-tender; bowel sounds normal; no masses,  no organomegaly Extremities: extremities normal, atraumatic, no cyanosis or edema  Lab Results:  Recent Labs  09/19/14 0400 09/20/14 0419  WBC 15.4* 11.8*  HGB 7.3* 7.9*  PLT 333 369    Recent Labs  09/19/14 0340 09/20/14 0419  NA 137 142  K 3.1* 3.8  CL 104 106  CO2 26 27  GLUCOSE 175* 130*  BUN 62* 52*  CREATININE 2.04* 1.83*   No results for input(s): TROPONINI in the last 72 hours.  Invalid input(s): CK, MB Hepatic Function Panel  Recent Labs  09/20/14 0419  PROT 6.1*  ALBUMIN 3.0*  AST 156*  ALT 619*  ALKPHOS 184*  BILITOT 8.0*  BILIDIR 4.1*  IBILI 3.9*   No results for input(s): CHOL in the last 72 hours. No results for input(s): PROTIME in the last 72 hours.  Imaging: Imaging results have been reviewed and No results found.  Cardiac Studies:  Assessment/Plan:  Status postAtypical chest pain with minimally elevated troponin I secondary to type II MI due to demand ischemia. Mild CAD in  the past Severe Nonischemic cardiomyopathy probably secondary to EtOH abuse and decompensated by tachycardia Increased LFTs,etiology multifactorial shock liver/EtOH abuse/questionable chemotherapy Hypertension Chronic myeloid leukemia Multisystem organ failure rule out sepsis/DIC Acute renal injury History of CVA 2 in the past EtOH abuse Tobacco abuse Questionable status post TIA Anemia of chronic disease rule out GI loss Plan Continue present management Will up titrate carvedilol and BiDil as blood pressure tolerates Check labs in a.m.   LOS: 7 days    [name] 09/20/2014, 7:08 PM

## 2014-09-20 NOTE — Progress Notes (Signed)
[facility] PROGRESS NOTE  Bruce [name] YPP:[id number] DOB: 05/06/1945 DOA: 09/13/2014 PCP: [name], MD  Interim summary: 69 y/o M, Jehovah's Witness, smoker / ETOH use, with PMH of CML who presented to [facility] ER with reports of worsening SOB. He was found to have AKI, mild tachycardia and elevated lactic acid. He was admitted for cardiogenic shock and started on milrinone drip. He was on PCCM service and was transferred to hospitalist service on 6/10.  Assessment/Plan: Acute respiratory failure from acute decompensated systolic heart failure ; He was started on milrinone drip and was d/ced on 6/9. He wasa started on po lasix and bidil. Holding lisinopril for renal insufficiency.  Cardiology consulted and recommendations given.  Holding plavix for worsening anemia.  He is on RA with good oxygen sats.    Acute kidney injurty with anion gap metabolic acidosis and lactic acidosis: Unclear etiology. Diuresing well.  Watch renal function on lasix.  Improved.    Acute liver failure; probably from shock liver from heart failure: Hepatitis panel negative.  RMSF negative.  EBV negative.  US abdomen unremarkable.  Lyme panel negative.   Liver function tests improving. Patent denies any nausea, vomiting or abdominal pain.    H/o CML follows with Dr Burr Medico. He is is Sales promotion account executive witness.  Heme onc consulted and following.    Leukocytosis: Improving.  Cultures negative so far.    Constipation: Stool softeners and miralax ordered.       Code Status: full code.  Family Communication: none at bedside Disposition Plan: transfer to telemetry.    Consultants:  PCCM  Cardiology  RENAL.  Procedures: 6/03 CT ABD >> no acute intra-abdominal pathology 6/4 Echo LVEF 10-15%, mild LVH, mild mR, RV dilated, RVSP 48 6/4 RUQ U/S with doppler> normal liver, GB wall thickening, sludge, portal vein pressures increased 6/5 milrinone started for working dx of cardiogenic shock w/  MODS 6/8 milrinone gtt reduced by half 6/9 milrinone gtt off  Antibiotics:  none  HPI/Subjective: Reports being constipated.   Objective: Filed Vitals:   09/20/14 1400  BP: 104/52  Pulse: 74  Temp:   Resp: 27    Intake/Output Summary (Last 24 hours) at 09/20/14 1421 Last data filed at 09/20/14 1253  Gross per 24 hour  Intake   1300 ml  Output   4450 ml  Net  -3150 ml   Filed Weights   09/18/14 0400 09/19/14 0500 09/20/14 0730  Weight: 83.4 kg (183 lb 13.8 oz) 80.3 kg (177 lb 0.5 oz) 77.8 kg (171 lb 8.3 oz)    Exam:   General:  Alert and sitting in the chair  Cardiovascular: s1s2  Respiratory: ctab  Abdomen: soft non tender non distended bowel sounds heard  Musculoskeletal: no pedal edema.   Data Reviewed: Basic Metabolic Panel:  Recent Labs Lab 09/13/14 1451  09/14/14 0525 09/15/14 0406 09/16/14 4580 09/17/14 0330 09/18/14 0434 09/19/14 0340 09/20/14 0419  NA  --   < > 137 139 137 136 140 137 142  K  --   < > 5.6* 5.1 4.0 3.4* 3.6 3.1* 3.8  CL  --   < > 104 109 108 108 108 104 106  CO2  --   < > 14* 13* 17* 19* 23 26 27   GLUCOSE  --   < > 115* 109* 155* 143* 155* 175* 130*  BUN  --   < > 56* 65* 73* 72* 64* 62* 52*  CREATININE  --   < > 3.30* 3.30* 3.41*  3.05* 2.23* 2.04* 1.83*  CALCIUM  --   < > 7.8* 7.2* 7.2* 7.4* 7.9* 7.9* 8.5*  MG 2.6*  --  2.4 2.3 2.3 2.3  --   --   --   PHOS  --   --  5.3* 5.0* 3.5 2.2*  --   --   --   < > = values in this interval not displayed. Liver Function Tests:  Recent Labs Lab 09/16/14 0342 09/17/14 0330 09/18/14 0434 09/19/14 0930 09/20/14 0419  AST 2194* 1122* 536* 236* 156*  ALT 1888* 1480* 1188* 799* 619*  ALKPHOS 202* 198* 195* 192* 184*  BILITOT 8.0* 9.5* 14.2* 12.1* 8.0*  PROT 5.6* 5.5* 5.5* 5.9* 6.1*  ALBUMIN 3.0* 2.8* 2.8* 2.8* 3.0*    Recent Labs Lab 09/13/14 1554  LIPASE 24  AMYLASE 59    Recent Labs Lab 09/14/14 1353  AMMONIA 32   CBC:  Recent Labs Lab 09/16/14 0342  09/16/14 1255 09/17/14 0330 09/18/14 0434 09/19/14 0400 09/20/14 0419  WBC 14.0* 14.7* 16.1* 18.3* 15.4* 11.8*  NEUTROABS 11.3* 11.9* 12.6*  --  10.4* 6.9  HGB 8.9* 8.5* 8.1* 8.0* 7.3* 7.9*  HCT 27.7* 26.8* 25.5* 24.4* 23.2* 24.1*  MCV 89.6 89.6 90.1 90.7 90.6 91.6  PLT 257 246 250 306 333 369   Cardiac Enzymes:  Recent Labs Lab 09/13/14 1724 09/13/14 2300 09/14/14 0525 09/14/14 1117  CKTOTAL 165  --   --   --   TROPONINI 0.48* 0.49* 0.47* 0.55*   BNP (last 3 results)  Recent Labs  09/15/14 0406  BNP 1838.9*    ProBNP (last 3 results)  Recent Labs  03/05/14 1515  PROBNP 376.5*    CBG: No results for input(s): GLUCAP in the last 168 hours.  Recent Results (from the past 240 hour(s))  Culture, blood (routine x 2)     Status: None   Collection Time: 09/13/14 10:34 AM  Result Value Ref Range Status   Specimen Description BLOOD LEFT ANTECUBITAL  Final   Special Requests BOTTLES DRAWN AEROBIC AND ANAEROBIC 3ML  Final   Culture   Final    NO GROWTH 5 DAYS Performed at [facility]    Report Status 09/19/2014 FINAL  Final  Culture, blood (routine x 2)     Status: None   Collection Time: 09/13/14 11:29 AM  Result Value Ref Range Status   Specimen Description BLOOD RIGHT ANTECUBITAL  Final   Special Requests BOTTLES DRAWN AEROBIC AND ANAEROBIC 5 CC EA  Final   Culture   Final    NO GROWTH 5 DAYS Performed at [facility]    Report Status 09/19/2014 FINAL  Final  MRSA PCR Screening     Status: None   Collection Time: 09/13/14  4:48 PM  Result Value Ref Range Status   MRSA by PCR NEGATIVE NEGATIVE Final    Comment:        The GeneXpert MRSA Assay (FDA approved for NASAL specimens only), is one component of a comprehensive MRSA colonization surveillance program. It is not intended to diagnose MRSA infection nor to guide or monitor treatment for MRSA infections.   Culture, Urine     Status: None   Collection Time: 09/13/14  7:09 PM   Result Value Ref Range Status   Specimen Description URINE, CATHETERIZED  Final   Special Requests NONE  Final   Colony Count NO GROWTH Performed at [facility]   Final   Culture NO GROWTH Performed at [facility]   Final   Report Status 09/15/2014 FINAL  Final  Culture, blood (routine x 2)     Status: None (Preliminary result)   Collection Time: 09/19/14  9:15 AM  Result Value Ref Range Status   Specimen Description BLOOD RIGHT ARM  Final   Special Requests BOTTLES DRAWN AEROBIC AND ANAEROBIC 10CC  Final   Culture   Final           BLOOD CULTURE RECEIVED NO GROWTH TO DATE CULTURE WILL BE HELD FOR 5 DAYS BEFORE ISSUING A FINAL NEGATIVE REPORT Performed at [facility]    Report Status PENDING  Incomplete  Culture, blood (routine x 2)     Status: None (Preliminary result)   Collection Time: 09/19/14  9:49 AM  Result Value Ref Range Status   Specimen Description BLOOD RIGHT HAND  Final   Special Requests BOTTLES DRAWN AEROBIC AND ANAEROBIC 5CC  Final   Culture   Final           BLOOD CULTURE RECEIVED NO GROWTH TO DATE CULTURE WILL BE HELD FOR 5 DAYS BEFORE ISSUING A FINAL NEGATIVE REPORT Performed at [facility]    Report Status PENDING  Incomplete     Studies: No results found.  Scheduled Meds: . aspirin  81 mg Oral Daily  . carvedilol  3.125 mg Oral BID WC  . Darbepoetin Alfa  60 mcg Subcutaneous q1800  . dextromethorphan  15 mg Oral BID  . furosemide  80 mg Oral BID  . isosorbide-hydrALAZINE  0.5 tablet Oral TID  . LORazepam  0.5 mg Intravenous Once  . pantoprazole  40 mg Oral Daily  . polyethylene glycol  17 g Oral Daily  . senna-docusate  2 tablet Oral BID  . sodium chloride  3 mL Intravenous Q12H   Continuous Infusions:   Principal Problem:   Sepsis Active Problems:   Chest pain   CML (chronic myelocytic leukemia)   TIA (transient ischemic attack)   H/O: stroke with residual effects   Transaminitis   Acute kidney  injury   Abdominal pain   Lactic acidosis   Elevated troponin   SIRS (systemic inflammatory response syndrome)   Blood poisoning    Time spent: 35 min    Blessin Kanno  [facility] Pager [phone] If 7PM-7AM, please contact night-coverage at [email or web address], password [facility] 09/20/2014, 2:21 PM  LOS: 7 days

## 2014-09-21 LAB — BASIC METABOLIC PANEL
Anion gap: 10 (ref 5–15)
BUN: 44 mg/dL — ABNORMAL HIGH (ref 6–20)
CALCIUM: 8.6 mg/dL — AB (ref 8.9–10.3)
CHLORIDE: 104 mmol/L (ref 101–111)
CO2: 28 mmol/L (ref 22–32)
Creatinine, Ser: 1.65 mg/dL — ABNORMAL HIGH (ref 0.61–1.24)
GFR calc Af Amer: 48 mL/min — ABNORMAL LOW (ref >60)
GFR calc non Af Amer: 41 mL/min — ABNORMAL LOW (ref >60)
Glucose, Bld: 121 mg/dL — ABNORMAL HIGH (ref 65–99)
POTASSIUM: 3.7 mmol/L (ref 3.5–5.1)
SODIUM: 142 mmol/L (ref 135–145)

## 2014-09-21 NOTE — Progress Notes (Signed)
Physical Therapy Treatment Patient Details Name: Bruce [name] MRN: [id number] DOB: 12/14/1945 Today's Date: 09/21/2014    History of Present Illness 69 year old jehovah's witness male with history of CML (diagnosed in November 2015), coronary artery disease, nonischemic cardiomyopathy with EF of 40%, tobacco and alcohol use, hypertension, hyperlipidemia, history of TIAs (with some residual facial droop and partial left eye blindness) presented to the ED with lower abdominal pain and substernal chest pain.  Dx of sepsis with lactic acidosis, AKI.    PT Comments    Pt able to increase ambulation distance today with RW, but with decreased balance.  He had LOB with turning in the room requiring MOD A to recover.  Pt would benefit from SNF rehab to work towards returning home alone at prior level of function which was ambulating with cane.  He is agreeable to SNF short term rehab.  Follow Up Recommendations  SNF;Supervision for mobility/OOB     Equipment Recommendations  Rolling walker with 5" wheels    Recommendations for Other Services       Precautions / Restrictions Precautions Precautions: Fall Restrictions Weight Bearing Restrictions: No    Mobility  Bed Mobility Overal bed mobility: Modified Independent                Transfers Overall transfer level: Needs assistance Equipment used: Rolling walker (2 wheeled) Transfers: Sit to/from Stand Sit to Stand: Supervision         General transfer comment: slightly unsteady upon standing, but able to steady self with RW  Ambulation/Gait Ambulation/Gait assistance: Min guard Ambulation Distance (Feet): 300 Feet Assistive device: Rolling walker (2 wheeled) Gait Pattern/deviations: Decreased weight shift to left;Drifts right/left;Step-through pattern Gait velocity: WFL   General Gait Details: tends to lean to the R which causes some unsteadiness with gait.  In room, when turning around, pt had LOB which required  MOD A when his foot crossed midline and was unable to recover without A.   Stairs            Wheelchair Mobility    Modified Rankin (Stroke Patients Only)       Balance Overall balance assessment: Needs assistance Sitting-balance support: Feet supported;No upper extremity supported Sitting balance-Leahy Scale: Good     Standing balance support: Bilateral upper extremity supported Standing balance-Leahy Scale: Fair                      Cognition Arousal/Alertness: Awake/alert Behavior During Therapy: WFL for tasks assessed/performed Overall Cognitive Status: Within Functional Limits for tasks assessed                      Exercises      General Comments General comments (skin integrity, edema, etc.): Had recliner follow with gait today due to limited gait last session due to dizziness. Did not need recliner today.      Pertinent Vitals/Pain Pain Assessment: No/denies pain    Home Living                      Prior Function            PT Goals (current goals can now be found in the care plan section) Acute Rehab PT Goals Patient Stated Goal: to be able to get up and walk PT Goal Formulation: With patient Time For Goal Achievement: 10/02/14 Potential to Achieve Goals: Good Progress towards PT goals: Progressing toward goals    Frequency  Min 3X/week  PT Plan Current plan remains appropriate    Co-evaluation             End of Session Equipment Utilized During Treatment: Gait belt Activity Tolerance: Patient tolerated treatment well Patient left: in chair;with call bell/phone within reach;with chair alarm set     Time: 1003-1019 PT Time Calculation (min) (ACUTE ONLY): 16 min  Charges:  $Gait Training: 8-22 mins                    G Codes:      Dakiya Puopolo LUBECK 09/21/2014, 11:05 AM

## 2014-09-21 NOTE — Progress Notes (Signed)
Subjective:  Doing well denies any chest pain or shortness of breath. States his urine output has improved markedly. Renal function is slowly improving  Objective:  Vital Signs in the last 24 hours: Temp:  [97.2 F (36.2 C)-98.6 F (37 C)] 97.6 F (36.4 C) (06/11 0522) Pulse Rate:  [69-83] 83 (06/11 0522) Resp:  [17-27] 20 (06/11 0522) BP: (95-104)/(52-72) 104/72 mmHg (06/11 0522) SpO2:  [96 %-99 %] 96 % (06/11 0522) Weight:  [72.3 kg (159 lb 6.3 oz)-74.1 kg (163 lb 5.8 oz)] 72.3 kg (159 lb 6.3 oz) (06/11 0522)  Intake/Output from previous day: 06/10 0701 - 06/11 0700 In: 960 [P.O.:960] Out: 2175 [Urine:2175] Intake/Output from this shift: Total I/O In: 120 [P.O.:120] Out: 550 [Urine:550]  Physical Exam: Neck: no adenopathy, no carotid bruit, no JVD and supple, symmetrical, trachea midline Lungs: Decreased breath sound at bases with faint rales air entry has improved Heart: regular rate and rhythm, S1, S2 normal and Soft systolic murmur noted Abdomen: soft, non-tender; bowel sounds normal; no masses,  no organomegaly Extremities: extremities normal, atraumatic, no cyanosis or edema  Lab Results:  Recent Labs  09/19/14 0400 09/20/14 0419  WBC 15.4* 11.8*  HGB 7.3* 7.9*  PLT 333 369    Recent Labs  09/20/14 0419 09/21/14 0510  NA 142 142  K 3.8 3.7  CL 106 104  CO2 27 28  GLUCOSE 130* 121*  BUN 52* 44*  CREATININE 1.83* 1.65*   No results for input(s): TROPONINI in the last 72 hours.  Invalid input(s): CK, MB Hepatic Function Panel  Recent Labs  09/20/14 0419  PROT 6.1*  ALBUMIN 3.0*  AST 156*  ALT 619*  ALKPHOS 184*  BILITOT 8.0*  BILIDIR 4.1*  IBILI 3.9*   No results for input(s): CHOL in the last 72 hours. No results for input(s): PROTIME in the last 72 hours.  Imaging: Imaging results have been reviewed and No results found.  Cardiac Studies:  Assessment/Plan:  Status postAtypical chest pain with minimally elevated troponin I  secondary to type II MI due to demand ischemia. Mild CAD in the past Severe Nonischemic cardiomyopathy probably secondary to EtOH abuse and decompensated by tach andyPlan plan tocardia Increased LFTs,etiology multifactorial shock liver/EtOH abuse/questionable chemotherapy Hypertension Chronic myeloid leukemia Multisystem organ failure rule out sepsis/DIC Acute renal injury History of CVA 2 in the past EtOH abuse Tobacco abuse Questionable status post TIA Anemia of chronic disease rule out GI loss Plan  continue present management  Uptitrate beta blockers and Bidil as blood pressure tolerates   LOS: 8 days    [name] 09/21/2014, 11:01 AM

## 2014-09-21 NOTE — Progress Notes (Signed)
[facility] PROGRESS NOTE  Bruce [name] FFM:[id number] DOB: May 07, 1945 DOA: 09/13/2014 PCP: [name], MD  Interim summary: 70 y/o M, Jehovah's Witness, smoker / ETOH use, with PMH of CML who presented to [facility] ER with reports of worsening SOB. He was found to have AKI, mild tachycardia and elevated lactic acid. He was admitted for cardiogenic shock and started on milrinone drip. He was on PCCM service and was transferred to hospitalist service on 6/10.  Assessment/Plan: Acute respiratory failure from acute decompensated systolic heart failure ; He was started on milrinone drip and was d/ced on 6/9. He was started on po lasix and bidil. Holding lisinopril for renal insufficiency.  Cardiology consulted and recommendations given.  Holding plavix for worsening anemia.  He is on RA with good oxygen sats.     Acute kidney injurty with anion gap metabolic acidosis and lactic acidosis: Unclear etiology. Diuresing well.  Watch renal function on lasix.  Improving renal function.    Acute liver failure; probably from shock liver from heart failure: Hepatitis panel negative.  RMSF negative.  EBV negative.  US abdomen unremarkable.  Lyme panel negative.   Liver function tests improving. Patent denies any nausea, vomiting or abdominal pain.    H/o CML follows with Dr Burr Medico. He is is Sales promotion account executive witness.  Heme onc consulted and following.    Leukocytosis: Improving.  Cultures negative so far.    Constipation: Stool softeners and miralax ordered.  Resolved.       Code Status: full code.  Family Communication: none at bedside Disposition Plan: to SNF when bed available.    Consultants:  PCCM  Cardiology  RENAL.  Procedures: 6/03 CT ABD >> no acute intra-abdominal pathology 6/4 Echo LVEF 10-15%, mild LVH, mild mR, RV dilated, RVSP 48 6/4 RUQ U/S with doppler> normal liver, GB wall thickening, sludge, portal vein pressures increased 6/5 milrinone started for  working dx of cardiogenic shock w/ MODS 6/8 milrinone gtt reduced by half 6/9 milrinone gtt off  Antibiotics:  none  HPI/Subjective: Sitting in the chair. No new complaints.   Objective: Filed Vitals:   09/21/14 1327  BP: 106/58  Pulse: 75  Temp: 97.9 F (36.6 C)  Resp: 18    Intake/Output Summary (Last 24 hours) at 09/21/14 1637 Last data filed at 09/21/14 1332  Gross per 24 hour  Intake    600 ml  Output   2000 ml  Net  -1400 ml   Filed Weights   09/20/14 0730 09/20/14 1505 09/21/14 0522  Weight: 77.8 kg (171 lb 8.3 oz) 74.1 kg (163 lb 5.8 oz) 72.3 kg (159 lb 6.3 oz)    Exam:   General:  Alert and sitting in the chair  Cardiovascular: s1s2  Respiratory: ctab  Abdomen: soft non tender non distended bowel sounds heard  Musculoskeletal: no pedal edema.   Data Reviewed: Basic Metabolic Panel:  Recent Labs Lab 09/15/14 0406 09/16/14 0342 09/17/14 0330 09/18/14 0434 09/19/14 0340 09/20/14 0419 09/21/14 0510  NA 139 137 136 140 137 142 142  K 5.1 4.0 3.4* 3.6 3.1* 3.8 3.7  CL 109 108 108 108 104 106 104  CO2 13* 17* 19* 23 26 27 28   GLUCOSE 109* 155* 143* 155* 175* 130* 121*  BUN 65* 73* 72* 64* 62* 52* 44*  CREATININE 3.30* 3.41* 3.05* 2.23* 2.04* 1.83* 1.65*  CALCIUM 7.2* 7.2* 7.4* 7.9* 7.9* 8.5* 8.6*  MG 2.3 2.3 2.3  --   --   --   --  PHOS 5.0* 3.5 2.2*  --   --   --   --    Liver Function Tests:  Recent Labs Lab 09/16/14 0342 09/17/14 0330 09/18/14 0434 09/19/14 0930 09/20/14 0419  AST 2194* 1122* 536* 236* 156*  ALT 1888* 1480* 1188* 799* 619*  ALKPHOS 202* 198* 195* 192* 184*  BILITOT 8.0* 9.5* 14.2* 12.1* 8.0*  PROT 5.6* 5.5* 5.5* 5.9* 6.1*  ALBUMIN 3.0* 2.8* 2.8* 2.8* 3.0*   No results for input(s): LIPASE, AMYLASE in the last 168 hours. No results for input(s): AMMONIA in the last 168 hours. CBC:  Recent Labs Lab 09/16/14 0342 09/16/14 1255 09/17/14 0330 09/18/14 0434 09/19/14 0400 09/20/14 0419  WBC 14.0* 14.7*  16.1* 18.3* 15.4* 11.8*  NEUTROABS 11.3* 11.9* 12.6*  --  10.4* 6.9  HGB 8.9* 8.5* 8.1* 8.0* 7.3* 7.9*  HCT 27.7* 26.8* 25.5* 24.4* 23.2* 24.1*  MCV 89.6 89.6 90.1 90.7 90.6 91.6  PLT 257 246 250 306 333 369   Cardiac Enzymes: No results for input(s): CKTOTAL, CKMB, CKMBINDEX, TROPONINI in the last 168 hours. BNP (last 3 results)  Recent Labs  09/15/14 0406 09/20/14 2047  BNP 1838.9* 2397.1*    ProBNP (last 3 results)  Recent Labs  03/05/14 1515  PROBNP 376.5*    CBG: No results for input(s): GLUCAP in the last 168 hours.  Recent Results (from the past 240 hour(s))  Culture, blood (routine x 2)     Status: None   Collection Time: 09/13/14 10:34 AM  Result Value Ref Range Status   Specimen Description BLOOD LEFT ANTECUBITAL  Final   Special Requests BOTTLES DRAWN AEROBIC AND ANAEROBIC 3ML  Final   Culture   Final    NO GROWTH 5 DAYS Performed at [facility]    Report Status 09/19/2014 FINAL  Final  Culture, blood (routine x 2)     Status: None   Collection Time: 09/13/14 11:29 AM  Result Value Ref Range Status   Specimen Description BLOOD RIGHT ANTECUBITAL  Final   Special Requests BOTTLES DRAWN AEROBIC AND ANAEROBIC 5 CC EA  Final   Culture   Final    NO GROWTH 5 DAYS Performed at [facility]    Report Status 09/19/2014 FINAL  Final  MRSA PCR Screening     Status: None   Collection Time: 09/13/14  4:48 PM  Result Value Ref Range Status   MRSA by PCR NEGATIVE NEGATIVE Final    Comment:        The GeneXpert MRSA Assay (FDA approved for NASAL specimens only), is one component of a comprehensive MRSA colonization surveillance program. It is not intended to diagnose MRSA infection nor to guide or monitor treatment for MRSA infections.   Culture, Urine     Status: None   Collection Time: 09/13/14  7:09 PM  Result Value Ref Range Status   Specimen Description URINE, CATHETERIZED  Final   Special Requests NONE  Final   Colony Count NO  GROWTH Performed at [facility]   Final   Culture NO GROWTH Performed at [facility]   Final   Report Status 09/15/2014 FINAL  Final  Culture, blood (routine x 2)     Status: None (Preliminary result)   Collection Time: 09/19/14  9:15 AM  Result Value Ref Range Status   Specimen Description BLOOD RIGHT ARM  Final   Special Requests BOTTLES DRAWN AEROBIC AND ANAEROBIC 10CC  Final   Culture   Final  BLOOD CULTURE RECEIVED NO GROWTH TO DATE CULTURE WILL BE HELD FOR 5 DAYS BEFORE ISSUING A FINAL NEGATIVE REPORT Performed at [facility]    Report Status PENDING  Incomplete  Culture, blood (routine x 2)     Status: None (Preliminary result)   Collection Time: 09/19/14  9:49 AM  Result Value Ref Range Status   Specimen Description BLOOD RIGHT HAND  Final   Special Requests BOTTLES DRAWN AEROBIC AND ANAEROBIC 5CC  Final   Culture   Final           BLOOD CULTURE RECEIVED NO GROWTH TO DATE CULTURE WILL BE HELD FOR 5 DAYS BEFORE ISSUING A FINAL NEGATIVE REPORT Performed at [facility]    Report Status PENDING  Incomplete     Studies: No results found.  Scheduled Meds: . aspirin  81 mg Oral Daily  . carvedilol  3.125 mg Oral BID WC  . Darbepoetin Alfa  60 mcg Subcutaneous q1800  . dextromethorphan  15 mg Oral BID  . furosemide  80 mg Oral BID  . isosorbide-hydrALAZINE  0.5 tablet Oral TID  . LORazepam  0.5 mg Intravenous Once  . pantoprazole  40 mg Oral Daily  . polyethylene glycol  17 g Oral Daily  . senna-docusate  2 tablet Oral BID  . sodium chloride  3 mL Intravenous Q12H   Continuous Infusions:   Principal Problem:   Sepsis Active Problems:   Chest pain   CML (chronic myelocytic leukemia)   TIA (transient ischemic attack)   H/O: stroke with residual effects   Transaminitis   Acute kidney injury   Abdominal pain   Lactic acidosis   Elevated troponin   SIRS (systemic inflammatory response syndrome)   Blood  poisoning    Time spent: 35 min    Yisell Sprunger  [facility] Pager [phone] If 7PM-7AM, please contact night-coverage at [email or web address], password [email or web address] 09/21/2014, 4:37 PM  LOS: 8 days

## 2014-09-22 DIAGNOSIS — N179 Acute kidney failure, unspecified: Secondary | ICD-10-CM | POA: Diagnosis not present

## 2014-09-22 DIAGNOSIS — I255 Ischemic cardiomyopathy: Secondary | ICD-10-CM | POA: Diagnosis not present

## 2014-09-22 DIAGNOSIS — G459 Transient cerebral ischemic attack, unspecified: Secondary | ICD-10-CM | POA: Diagnosis not present

## 2014-09-22 DIAGNOSIS — D72829 Elevated white blood cell count, unspecified: Secondary | ICD-10-CM | POA: Diagnosis not present

## 2014-09-22 DIAGNOSIS — E860 Dehydration: Secondary | ICD-10-CM | POA: Diagnosis not present

## 2014-09-22 DIAGNOSIS — C9290 Myeloid leukemia, unspecified, not having achieved remission: Secondary | ICD-10-CM | POA: Diagnosis not present

## 2014-09-22 DIAGNOSIS — E872 Acidosis: Secondary | ICD-10-CM | POA: Diagnosis not present

## 2014-09-22 DIAGNOSIS — A4189 Other specified sepsis: Secondary | ICD-10-CM | POA: Diagnosis not present

## 2014-09-22 DIAGNOSIS — R079 Chest pain, unspecified: Secondary | ICD-10-CM | POA: Diagnosis not present

## 2014-09-22 DIAGNOSIS — R0789 Other chest pain: Secondary | ICD-10-CM | POA: Diagnosis not present

## 2014-09-22 DIAGNOSIS — I429 Cardiomyopathy, unspecified: Secondary | ICD-10-CM | POA: Diagnosis not present

## 2014-09-22 DIAGNOSIS — I5020 Unspecified systolic (congestive) heart failure: Secondary | ICD-10-CM | POA: Diagnosis not present

## 2014-09-22 DIAGNOSIS — I6930 Unspecified sequelae of cerebral infarction: Secondary | ICD-10-CM | POA: Diagnosis not present

## 2014-09-22 DIAGNOSIS — R740 Nonspecific elevation of levels of transaminase and lactic acid dehydrogenase [LDH]: Secondary | ICD-10-CM | POA: Diagnosis not present

## 2014-09-22 DIAGNOSIS — I10 Essential (primary) hypertension: Secondary | ICD-10-CM | POA: Diagnosis not present

## 2014-09-22 DIAGNOSIS — I2510 Atherosclerotic heart disease of native coronary artery without angina pectoris: Secondary | ICD-10-CM | POA: Diagnosis not present

## 2014-09-22 LAB — COMPREHENSIVE METABOLIC PANEL
ALK PHOS: 163 U/L — AB (ref 38–126)
ALT: 328 U/L — AB (ref 17–63)
AST: 75 U/L — ABNORMAL HIGH (ref 15–41)
Albumin: 2.8 g/dL — ABNORMAL LOW (ref 3.5–5.0)
Anion gap: 11 (ref 5–15)
BILIRUBIN TOTAL: 3.8 mg/dL — AB (ref 0.3–1.2)
BUN: 35 mg/dL — AB (ref 6–20)
CHLORIDE: 103 mmol/L (ref 101–111)
CO2: 29 mmol/L (ref 22–32)
CREATININE: 1.61 mg/dL — AB (ref 0.61–1.24)
Calcium: 8.8 mg/dL — ABNORMAL LOW (ref 8.9–10.3)
GFR calc Af Amer: 49 mL/min — ABNORMAL LOW (ref >60)
GFR calc non Af Amer: 42 mL/min — ABNORMAL LOW (ref >60)
GLUCOSE: 114 mg/dL — AB (ref 65–99)
Potassium: 3.8 mmol/L (ref 3.5–5.1)
Sodium: 143 mmol/L (ref 135–145)
Total Protein: 6.3 g/dL — ABNORMAL LOW (ref 6.5–8.1)

## 2014-09-22 MED ORDER — POLYETHYLENE GLYCOL 3350 17 G PO PACK: 17.0000 g | PACK | Freq: Every day | ORAL | Status: DC

## 2014-09-22 MED ORDER — FUROSEMIDE 80 MG PO TABS: 80.0000 mg | ORAL_TABLET | Freq: Two times a day (BID) | ORAL | Status: DC

## 2014-09-22 MED ORDER — CALCIUM CARBONATE ANTACID 500 MG PO CHEW: 2.0000 | CHEWABLE_TABLET | Freq: Four times a day (QID) | ORAL | Status: DC | PRN

## 2014-09-22 MED ORDER — ASPIRIN 81 MG PO CHEW: 81.0000 mg | CHEWABLE_TABLET | Freq: Every day | ORAL | Status: DC

## 2014-09-22 MED ORDER — PANTOPRAZOLE SODIUM 40 MG PO TBEC: 40.0000 mg | DELAYED_RELEASE_TABLET | Freq: Every day | ORAL | Status: DC

## 2014-09-22 MED ORDER — ISOSORB DINITRATE-HYDRALAZINE 20-37.5 MG PO TABS: 0.5000 | ORAL_TABLET | Freq: Three times a day (TID) | ORAL | Status: DC

## 2014-09-22 NOTE — Clinical Social Work Placement (Signed)
   CLINICAL SOCIAL WORK PLACEMENT  NOTE  Date:  09/22/2014  Patient Details  Name: Bruce [name] MRN: [id number] Date of Birth: May 14, 1945  Clinical Social Work is seeking post-discharge placement for this patient at the [place] level of care (*CSW will initial, date and re-position this form in  chart as items are completed):  Yes   Patient/family provided with [place] Work Department's list of facilities offering this level of care within the geographic area requested by the patient (or if unable, by the patient's family).  Yes   Patient/family informed of their freedom to choose among providers that offer the needed level of care, that participate in Medicare, Medicaid or managed care program needed by the patient, have an available bed and are willing to accept the patient.  Yes   Patient/family informed of 's ownership interest in [facility] and [facility], as well as of the fact that they are under no obligation to receive care at these facilities.  PASRR submitted to EDS on       PASRR number received on       Existing PASRR number confirmed on       FL2 transmitted to all facilities in geographic area requested by pt/family on       FL2 transmitted to all facilities within larger geographic area on       Patient informed that his/her managed care company has contracts with or will negotiate with certain facilities, including the following:            Patient/family informed of bed offers received.  Patient chooses bed at  [facility])     Physician recommends and patient chooses bed at      Patient to be transferred to  ([place]) on 09/22/14.  Patient to be transferred to facility by  (ambulance)     Patient family notified on   of transfer.  Name of family member notified:   ([name] pt states he's on his own)     PHYSICIAN       Additional Comment:     _______________________________________________ Carlean Jews, LCSW 09/22/2014, 1:59 PM

## 2014-09-22 NOTE — Clinical Social Work Note (Signed)
CSW confirmed pt has a bed ready for him at [facility] when medically ready to be discharged  CSW provided MD with availability today and will continue to monitor for discharge  .Dede Query, LCSW [facility] Clinical Social Worker - Weekend Coverage cell #: [phone]

## 2014-09-22 NOTE — Discharge Summary (Signed)
Physician Discharge Summary  Bruce [name] UVO:[id number] DOB: [birth year] DOA: 09/13/2014  PCP: [name], MD  Admit date: 09/13/2014 Discharge date: 09/22/2014  Time spent:  30 minutes  Recommendations for Outpatient Follow-up:  1. Follow up with cardiology in one week   Discharge Diagnoses:  Principal Problem:   Sepsis Active Problems:   Chest pain   CML (chronic myelocytic leukemia)   TIA (transient ischemic attack)   H/O: stroke with residual effects   Transaminitis   Acute kidney injury   Abdominal pain   Lactic acidosis   Elevated troponin   SIRS (systemic inflammatory response syndrome)   Blood poisoning   Discharge Condition: improved  Diet recommendation: low sodium diet  Filed Weights   09/20/14 1505 09/21/14 0522 09/22/14 0628  Weight: 74.1 kg (163 lb 5.8 oz) 72.3 kg (159 lb 6.3 oz) 71.2 kg (156 lb 15.5 oz)    History of present illness:  69 y/o M, Jehovah's Witness, smoker / ETOH use, with PMH of CML who presented to [facility] ER with reports of worsening SOB. He was found to have AKI, mild tachycardia and elevated lactic acid. He was admitted for cardiogenic shock and started on milrinone drip. He was on PCCM service and was transferred to hospitalist service on 6/10.   Hospital Course:  Acute respiratory failure from acute decompensated systolic heart failure ; He was started on milrinone drip and was d/ced on 6/9. He was started on po lasix and bidil. Holding lisinopril for renal insufficiency.  Cardiology consulted and recommendations given.  He is on RA with good oxygen sats.     Acute kidney injurty with anion gap metabolic acidosis and lactic acidosis: Unclear etiology. Diuresing well.  Watch renal function on lasix.  Improving renal function. Outpatient follow up.   Acute liver failure; probably from shock liver from heart failure: Hepatitis panel negative.  RMSF negative.  EBV negative.  US abdomen unremarkable.  Lyme panel  negative.  Liver function tests improving. Patent denies any nausea, vomiting or abdominal pain.    H/o CML follows with Dr Burr Medico. He is is Sales promotion account executive witness.  Heme onc consulted and following.  Outpatient follow up  Leukocytosis: Improving.  Cultures negative so far.    Constipation: Stool softeners and miralax ordered.  Resolved.   Procedures: 6/03 CT ABD >> no acute intra-abdominal pathology 6/4 Echo LVEF 10-15%, mild LVH, mild mR, RV dilated, RVSP 48 6/4 RUQ U/S with doppler> normal liver, GB wall thickening, sludge, portal vein pressures increased 6/5 milrinone started for working dx of cardiogenic shock w/ MODS 6/8 milrinone gtt reduced by half 6/9 milrinone gtt off Consultations:  PCCM  Cardiology  RENAL.  Discharge Exam: Filed Vitals:   09/22/14 0628  BP: 104/76  Pulse: 85  Temp: 97.8 F (36.6 C)  Resp: 20    General: alert afebrile comfortable Cardiovascular: s1s2 Respiratory: ctab  Discharge Instructions   Discharge Instructions    (HEART FAILURE PATIENTS) Call MD:  Anytime you have any of the following symptoms: 1) 3 pound weight gain in 24 hours or 5 pounds in 1 week 2) shortness of breath, with or without a dry hacking cough 3) swelling in the hands, feet or stomach 4) if you have to sleep on extra pillows at night in order to breathe.    Complete by:  As directed      Diet - low sodium heart healthy    Complete by:  As directed      Discharge instructions  Complete by:  As directed   Follow up with PCP and cardiology as recommended.          Current Discharge Medication List    START taking these medications   Details  aspirin 81 MG chewable tablet Chew 1 tablet (81 mg total) by mouth daily.    calcium carbonate (TUMS - DOSED IN MG ELEMENTAL CALCIUM) 500 MG chewable tablet Chew 2 tablets (400 mg of elemental calcium total) by mouth every 6 (six) hours as needed for indigestion or heartburn.    isosorbide-hydrALAZINE (BIDIL)  20-37.5 MG per tablet Take 0.5 tablets by mouth 3 (three) times daily.    pantoprazole (PROTONIX) 40 MG tablet Take 1 tablet (40 mg total) by mouth daily.    polyethylene glycol (MIRALAX / GLYCOLAX) packet Take 17 g by mouth daily. Qty: 14 each, Refills: 0      CONTINUE these medications which have CHANGED   Details  furosemide (LASIX) 80 MG tablet Take 1 tablet (80 mg total) by mouth 2 (two) times daily. Qty: 30 tablet      CONTINUE these medications which have NOT CHANGED   Details  albuterol (PROVENTIL HFA;VENTOLIN HFA) 108 (90 BASE) MCG/ACT inhaler Inhale 2 puffs into the lungs every 4 (four) hours as needed for wheezing or shortness of breath. Qty: 1 Inhaler, Refills: 3    atorvastatin (LIPITOR) 40 MG tablet Take 40 mg by mouth every morning.  Refills: 0    carvedilol (COREG) 3.125 MG tablet Take 3.125 mg by mouth 2 (two) times daily. Refills: 0    clopidogrel (PLAVIX) 75 MG tablet Take 1 tablet (75 mg total) by mouth daily. Qty: 30 tablet, Refills: 3    dasatinib (SPRYCEL) 100 MG tablet Take 100 mg by mouth daily.    potassium chloride SA (K-DUR,KLOR-CON) 20 MEQ tablet Take 1 tablet (20 mEq total) by mouth 2 (two) times daily. Qty: 10 tablet, Refills: 0    benzonatate (TESSALON) 100 MG capsule Take 1 capsule (100 mg total) by mouth every 8 (eight) hours. Qty: 21 capsule, Refills: 0      STOP taking these medications     ALPRAZolam (XANAX) 1 MG tablet      lisinopril (PRINIVIL,ZESTRIL) 20 MG tablet      oxyCODONE-acetaminophen (PERCOCET/ROXICET) 5-325 MG per tablet      imatinib (GLEEVEC) 400 MG tablet      predniSONE (DELTASONE) 20 MG tablet        No Known Allergies Follow-up Information    Follow up with [name], MD. Schedule an appointment as soon as possible for a visit in 1 week.   Specialty:  Cardiology   Contact information:   [address] [place] [address] [phone]        The results of significant  diagnostics from this hospitalization (including imaging, microbiology, ancillary and laboratory) are listed below for reference.    Significant Diagnostic Studies: Ct Abdomen Pelvis Wo Contrast  09/13/2014   CLINICAL DATA:  Sepsis.  EXAM: CT ABDOMEN AND PELVIS WITHOUT CONTRAST  TECHNIQUE: Multidetector CT imaging of the abdomen and pelvis was performed following the standard protocol without IV contrast.  COMPARISON:  CT scan of March 18, 2014.  FINDINGS: Visualized lung bases appear normal. No significant osseous abnormality is noted.  No gallstones are noted. No focal abnormality is noted in the liver, spleen or pancreas on these unenhanced images. Adrenal glands and kidneys appear normal. No hydronephrosis or renal obstruction is noted. No renal or ureteral  calculi are noted. The appendix appears normal. There is no evidence of bowel obstruction. Atherosclerosis of abdominal aorta is noted without aneurysm formation. Urinary bladder appears normal. No abnormal fluid collection is noted. No significant adenopathy is noted.  IMPRESSION: No acute abnormality seen in the abdomen or pelvis.   Electronically Signed   By: Marijo Conception, M.D.   On: 09/13/2014 16:17   Dg Chest 2 View (if Patient Has Fever And/or Copd)  09/13/2014   CLINICAL DATA:  Subsequent encounter for coughing congestion with mild Chest pain for couple of months.  EXAM: CHEST  2 VIEW  COMPARISON:  08/08/2014.  FINDINGS: The lungs are clear without focal infiltrate, edema, pneumothorax or pleural effusion. Cardiopericardial silhouette is at upper limits of normal for size. Imaged bony structures of the thorax are intact. Telemetry leads overlie the chest.  IMPRESSION: Upper normal to mildly enlarged heart.  Otherwise normal exam.   Electronically Signed   By: Misty Stanley M.D.   On: 09/13/2014 11:07   US Abdomen Complete  09/14/2014   CLINICAL DATA:  Transaminitis, abdominal pain for 2 months with nausea and vomiting  EXAM: ULTRASOUND  ABDOMEN COMPLETE  COMPARISON:  None ; correlation CT abdomen and pelvis 09/13/2014  FINDINGS: Gallbladder: Diffuse gallbladder wall thickening. No shadowing calculi or sonographic Murphy sign. Minimal dependent sludge.  Common bile duct: Diameter: Normal caliber 2 mm diameter  Liver: No focal abnormalities  IVC: Normal appearance  Pancreas: Normal appearance  Spleen: Normal appearance, 4.8 cm length  Right Kidney: Length: 12.0 cm. Normal morphology without mass or hydronephrosis.  Left Kidney: Length: 11.1 cm. Normal morphology without mass or hydronephrosis.  Abdominal aorta: Proximally normal appearance, mid to distal person she obscured by bowel gas.  Other findings: Tiny amount of ascites adjacent to liver. Tiny RIGHT pleural effusion.  IMPRESSION: Diffuse gallbladder wall thickening and small amount of gallbladder sludge without evidence of stones or sonographic Murphy sign ; unable to exclude acute cholecystitis with this appearance.  If acute cholecystitis is a clinical consideration consider followup hepatobiliary imaging.  Tiny amount of ascites and tiny RIGHT pleural effusion.   Electronically Signed   By: Lavonia Dana M.D.   On: 09/14/2014 16:42   [place] Art/ven Flow Abd Pelv Doppler  09/14/2014   CLINICAL DATA:  Hepatic failure, elevated LFTs  EXAM: DUPLEX ULTRASOUND OF LIVER  TECHNIQUE: Color and duplex Doppler ultrasound was performed to evaluate the hepatic in-flow and out-flow vessels.  COMPARISON:  Abdominal ultrasound 09/14/2014  FINDINGS: Portal Vein Velocities  Main:  30 cm/sec  Right:  29 cm/sec  Left:  34 cm/sec  Hepatic Vein Velocities  Right:  112 cm/sec  Middle:  72 cm/sec  Left:  109 cm/sec  Hepatic Artery Velocity:  148 cm/sec  Splenic Vein Velocity:  12 cm/sec  Varices: Absent  Ascites: Absent  Flow within main, RIGHT, and LEFT portal veins is pulsatile and bidirectional.  No focal hepatic abnormalities visualized.  IMPRESSION: Bidirectional flow within the main, RIGHT, and LEFT portal veins  suggesting elevated portal venous pressure.  This can be seen with cirrhosis though this can also be seen with post hepatic causes including elevated RIGHT heart pressures and tricuspid valvular disease  No evidence of venous occlusion.   Electronically Signed   By: Lavonia Dana M.D.   On: 09/14/2014 18:22   Dg Chest Port 1 View  09/18/2014   CLINICAL DATA:  Pulmonary edema  EXAM: PORTABLE CHEST - 1 VIEW  COMPARISON:  09/15/2014  FINDINGS:  There is mild unchanged cardiomegaly. The lungs are clear. There are no large effusions. No pneumothorax.  IMPRESSION: Unchanged cardiomegaly   Electronically Signed   By: Andreas Newport M.D.   On: 09/18/2014 06:57   Dg Chest Port 1 View  09/15/2014   CLINICAL DATA:  Shortness of breath and productive cough for 2 months fourth today, history coronary artery disease, hypertension, smoking, CML  EXAM: PORTABLE CHEST - 1 VIEW  COMPARISON:  Portable exam 0925 hours compared to 09/13/2014  FINDINGS: Enlargement of cardiac silhouette.  Mediastinal contours and pulmonary vascularity normal.  Slight rotation to the RIGHT.  Lungs clear.  No pleural effusion or pneumothorax.  Bones unremarkable.  IMPRESSION: Enlargement of cardiac silhouette.  No acute abnormalities.   Electronically Signed   By: Lavonia Dana M.D.   On: 09/15/2014 11:32    Microbiology: Recent Results (from the past 240 hour(s))  Culture, blood (routine x 2)     Status: None   Collection Time: 09/13/14 10:34 AM  Result Value Ref Range Status   Specimen Description BLOOD LEFT ANTECUBITAL  Final   Special Requests BOTTLES DRAWN AEROBIC AND ANAEROBIC 3ML  Final   Culture   Final    NO GROWTH 5 DAYS Performed at [facility]    Report Status 09/19/2014 FINAL  Final  Culture, blood (routine x 2)     Status: None   Collection Time: 09/13/14 11:29 AM  Result Value Ref Range Status   Specimen Description BLOOD RIGHT ANTECUBITAL  Final   Special Requests BOTTLES DRAWN AEROBIC AND ANAEROBIC 5 CC EA   Final   Culture   Final    NO GROWTH 5 DAYS Performed at [facility]    Report Status 09/19/2014 FINAL  Final  MRSA PCR Screening     Status: None   Collection Time: 09/13/14  4:48 PM  Result Value Ref Range Status   MRSA by PCR NEGATIVE NEGATIVE Final    Comment:        The GeneXpert MRSA Assay (FDA approved for NASAL specimens only), is one component of a comprehensive MRSA colonization surveillance program. It is not intended to diagnose MRSA infection nor to guide or monitor treatment for MRSA infections.   Culture, Urine     Status: None   Collection Time: 09/13/14  7:09 PM  Result Value Ref Range Status   Specimen Description URINE, CATHETERIZED  Final   Special Requests NONE  Final   Colony Count NO GROWTH Performed at [facility]   Final   Culture NO GROWTH Performed at [facility]   Final   Report Status 09/15/2014 FINAL  Final  Culture, blood (routine x 2)     Status: None (Preliminary result)   Collection Time: 09/19/14  9:15 AM  Result Value Ref Range Status   Specimen Description BLOOD RIGHT ARM  Final   Special Requests BOTTLES DRAWN AEROBIC AND ANAEROBIC 10CC  Final   Culture   Final           BLOOD CULTURE RECEIVED NO GROWTH TO DATE CULTURE WILL BE HELD FOR 5 DAYS BEFORE ISSUING A FINAL NEGATIVE REPORT Performed at [facility]    Report Status PENDING  Incomplete  Culture, blood (routine x 2)     Status: None (Preliminary result)   Collection Time: 09/19/14  9:49 AM  Result Value Ref Range Status   Specimen Description BLOOD RIGHT HAND  Final   Special Requests BOTTLES DRAWN AEROBIC AND ANAEROBIC  5CC  Final   Culture   Final           BLOOD CULTURE RECEIVED NO GROWTH TO DATE CULTURE WILL BE HELD FOR 5 DAYS BEFORE ISSUING A FINAL NEGATIVE REPORT Performed at [facility]    Report Status PENDING  Incomplete     Labs: Basic Metabolic Panel:  Recent Labs Lab 09/16/14 0342 09/17/14 0330 09/18/14 0434  09/19/14 0340 09/20/14 0419 09/21/14 0510 09/22/14 0515  NA 137 136 140 137 142 142 143  K 4.0 3.4* 3.6 3.1* 3.8 3.7 3.8  CL 108 108 108 104 106 104 103  CO2 17* 19* 23 26 27 28 29   GLUCOSE 155* 143* 155* 175* 130* 121* 114*  BUN 73* 72* 64* 62* 52* 44* 35*  CREATININE 3.41* 3.05* 2.23* 2.04* 1.83* 1.65* 1.61*  CALCIUM 7.2* 7.4* 7.9* 7.9* 8.5* 8.6* 8.8*  MG 2.3 2.3  --   --   --   --   --   PHOS 3.5 2.2*  --   --   --   --   --    Liver Function Tests:  Recent Labs Lab 09/17/14 0330 09/18/14 0434 09/19/14 0930 09/20/14 0419 09/22/14 0515  AST 1122* 536* 236* 156* 75*  ALT 1480* 1188* 799* 619* 328*  ALKPHOS 198* 195* 192* 184* 163*  BILITOT 9.5* 14.2* 12.1* 8.0* 3.8*  PROT 5.5* 5.5* 5.9* 6.1* 6.3*  ALBUMIN 2.8* 2.8* 2.8* 3.0* 2.8*   No results for input(s): LIPASE, AMYLASE in the last 168 hours. No results for input(s): AMMONIA in the last 168 hours. CBC:  Recent Labs Lab 09/16/14 0342 09/16/14 1255 09/17/14 0330 09/18/14 0434 09/19/14 0400 09/20/14 0419  WBC 14.0* 14.7* 16.1* 18.3* 15.4* 11.8*  NEUTROABS 11.3* 11.9* 12.6*  --  10.4* 6.9  HGB 8.9* 8.5* 8.1* 8.0* 7.3* 7.9*  HCT 27.7* 26.8* 25.5* 24.4* 23.2* 24.1*  MCV 89.6 89.6 90.1 90.7 90.6 91.6  PLT 257 246 250 306 333 369   Cardiac Enzymes: No results for input(s): CKTOTAL, CKMB, CKMBINDEX, TROPONINI in the last 168 hours. BNP: BNP (last 3 results)  Recent Labs  09/15/14 0406 09/20/14 2047  BNP 1838.9* 2397.1*    ProBNP (last 3 results)  Recent Labs  03/05/14 1515  PROBNP 376.5*    CBG: No results for input(s): GLUCAP in the last 168 hours.     SignedHosie Poisson  [facility] 09/22/2014, 12:27 PM

## 2014-09-22 NOTE — Progress Notes (Signed)
Subjective:  Doing well denies any chest pain or shortness of breath. No palpitation lightheadedness or syncope. States overall feels well being evaluated for assisted living/rehabilitation.  Objective:  Vital Signs in the last 24 hours: Temp:  [97.6 F (36.4 C)-97.9 F (36.6 C)] 97.8 F (36.6 C) (06/12 0628) Pulse Rate:  [75-85] 85 (06/12 0628) Resp:  [18-20] 20 (06/12 0628) BP: (98-106)/(55-76) 104/76 mmHg (06/12 0628) SpO2:  [98 %-99 %] 99 % (06/12 0628) Weight:  [71.2 kg (156 lb 15.5 oz)] 71.2 kg (156 lb 15.5 oz) (06/12 0628)  Intake/Output from previous day: 06/11 0701 - 06/12 0700 In: 800 [P.O.:800] Out: 2400 [Urine:2400] Intake/Output from this shift:    Physical Exam: Neck: no adenopathy, no carotid bruit, no JVD and supple, symmetrical, trachea midline Lungs: clear to auscultation bilaterally Heart: regular rate and rhythm, S1, S2 normal and Soft systolic murmur noted Abdomen: soft, non-tender; bowel sounds normal; no masses,  no organomegaly Extremities: extremities normal, atraumatic, no cyanosis or edema  Lab Results:  Recent Labs  09/20/14 0419  WBC 11.8*  HGB 7.9*  PLT 369    Recent Labs  09/21/14 0510 09/22/14 0515  NA 142 143  K 3.7 3.8  CL 104 103  CO2 28 29  GLUCOSE 121* 114*  BUN 44* 35*  CREATININE 1.65* 1.61*   No results for input(s): TROPONINI in the last 72 hours.  Invalid input(s): CK, MB Hepatic Function Panel  Recent Labs  09/20/14 0419 09/22/14 0515  PROT 6.1* 6.3*  ALBUMIN 3.0* 2.8*  AST 156* 75*  ALT 619* 328*  ALKPHOS 184* 163*  BILITOT 8.0* 3.8*  BILIDIR 4.1*  --   IBILI 3.9*  --    No results for input(s): CHOL in the last 72 hours. No results for input(s): PROTIME in the last 72 hours.  Imaging: Imaging results have been reviewed and No results found.  Cardiac Studies:  Assessment/Plan:  Status postAtypical chest pain with minimally elevated troponin I secondary to type II MI due to demand ischemia. Mild  CAD in the past Severe Nonischemic cardiomyopathy probably secondary to EtOH abuse and decompensated by tach andyPlan plan tocardia Increased LFTs,etiology multifactorial shock liver/EtOH abuse/questionable chemotherapy Hypertension Chronic myeloid leukemia Multisystem organ failure rule out sepsis/DIC Acute renal injury History of CVA 2 in the past EtOH abuse Tobacco abuse Questionable status post TIA Anemia of chronic disease rule out GI loss Plan Continue present management Okay to discharge from cardiac point of view Follow-up with me in one week we will uptitrate his beta blockers and BiDil as blood pressure tolerates Will consider ACE inhibitor if renal function normalizes. I'll sign off please call if needed  LOS: 9 days    [name] 09/22/2014, 12:17 PM

## 2014-09-22 NOTE — Care Management Note (Signed)
Case Management Note  Patient Details  Name: Bruce [name] MRN: [id number] Date of Birth: 06-29-1945    Expected Discharge Date:   09/22/2014              Expected Discharge Plan:  [place]  In-House Referral:  Chaplain, Clinical Social Work  Discharge planning Services  CM Consult  Post Acute Care Choice:    Choice offered to:     DME Arranged:    DME Agency:     HH Arranged:    [place] Agency:     Status of Service:  Completed, signed off  Medicare Important Message Given:  Yes Date Medicare IM Given:  09/22/14 Medicare IM give by:  Jonnie Finner RN CCM  Date Additional Medicare IM Given:    Additional Medicare Important Message give by:     If discussed at [place] of Stay Meetings, dates discussed:    Additional Comments: Chart reviewed. Planned dc to SNF.   Erenest Rasher, RN 09/22/2014, 2:11 PM

## 2014-09-24 ENCOUNTER — Encounter: Payer: Self-pay | Admitting: Internal Medicine

## 2014-09-24 ENCOUNTER — Non-Acute Institutional Stay (SKILLED_NURSING_FACILITY): Payer: Medicare Other | Admitting: Internal Medicine

## 2014-09-24 DIAGNOSIS — R740 Nonspecific elevation of levels of transaminase and lactic acid dehydrogenase [LDH]: Secondary | ICD-10-CM

## 2014-09-24 DIAGNOSIS — C9290 Myeloid leukemia, unspecified, not having achieved remission: Secondary | ICD-10-CM

## 2014-09-24 DIAGNOSIS — N179 Acute kidney failure, unspecified: Secondary | ICD-10-CM

## 2014-09-24 DIAGNOSIS — C9210 Chronic myeloid leukemia, BCR/ABL-positive, not having achieved remission: Secondary | ICD-10-CM

## 2014-09-24 DIAGNOSIS — R7401 Elevation of levels of liver transaminase levels: Secondary | ICD-10-CM

## 2014-09-24 DIAGNOSIS — I255 Ischemic cardiomyopathy: Secondary | ICD-10-CM

## 2014-09-24 DIAGNOSIS — I6930 Unspecified sequelae of cerebral infarction: Secondary | ICD-10-CM

## 2014-09-24 NOTE — Progress Notes (Signed)
Patient ID: Bruce [name], male   DOB: [birth year], 69 y.o.   MRN: [id number]    HISTORY AND PHYSICAL   DATE: 09/24/14  Location:  [facility]    Place of Service: SNF [phone])   Extended Emergency Contact Information Primary Emergency Contact: [place] Phone: [phone] Relation: Sister  Advanced Directive information  FULL CODE  Chief Complaint  Patient presents with  . New Admit To SNF    HPI:  69 yo male seen today as a new admission to SNF following hospital stay for sepsis with elevated lactic acidosis, chest pain, CML, hx CVA with residual deficits, transaminitis, AKI, abdominal pain, elevated troponin, SIRS and blood poisoning. Hospital records reviewed. He was admitted with cardiogenic shock and placed on milrinone gtt. Status improved and gtt stopped. Creatinine improved from 3.41 -->1.61. LFTS AST/ALT 1122/1480 --> 75/328. BNP 1838 --> 2397. 2D echo 10-15% EF with RVSP 48. Hepatitis panel neg.   Today he reports SOB but overall feels better. He no longer smokes or drinks Etoh.  He is a Je[employer] Witness. No nursing issues. Appetite ok and sleeping well. No CP or palpitations. (+) fatigue  Hx CVA - stable. He takes ASA, statin, plavix  CAD/HTN/hyperlipidemia - stable on bidil, ASA, statin, coreg, lasix with potassium supplement  Dyspepsia - stable on protonix. He takes miralax  For constipation  CML - followed by hematology. stable overall    Past Medical History  Diagnosis Date  . Coronary artery disease   . Hypertension   . Hypercholesterolemia   . Stroke     No residual limb weakness.  Walks with cane at baseline.   Marland Kitchen CML (chronic myelocytic leukemia)   . TIA (transient ischemic attack) 05/10/2014  . Bell's palsy     Past Surgical History  Procedure Laterality Date  . Back surgery    . Hip arthroplasty Right     orif  . Orif forearm fracture Right     Patient Care Team: [name], MD as  PCP - General (Cardiology) Truitt Merle, MD as Consulting Physician (Hematology)  History   Social History  . Marital Status: Widowed    Spouse Name: N/A  . Number of Children: 0  . Years of Education: N/A   Occupational History  . retired    Social History Main Topics  . Smoking status: Former Smoker -- 0.50 packs/day for 50 years    Types: Cigarettes  . Smokeless tobacco: Never Used  . Alcohol Use: 0.6 oz/week    1 Cans of beer per week     Comment: daily   . Drug Use: No  . Sexual Activity: Not on file   Other Topics Concern  . Not on file   Social History Narrative   Patient is right handed.   Patient drinks 1-2 cups daily.     reports that he has quit smoking. His smoking use included Cigarettes. He has a 25 pack-year smoking history. He has never used smokeless tobacco. He reports that he drinks about 0.6 oz of alcohol per week. He reports that he does not use illicit drugs.  Family History  Problem Relation Age of Onset  . Diabetes Mother   . Hypertension Mother   . Diabetes Father   . Hypertension Father   . Diabetes Brother   . Hypertension Brother   . Diabetes Sister   . Hypertension Sister   . Diabetes Brother   . Hypertension Brother   .  Diabetes Sister   . Hypertension Sister    Family Status  Relation Status Death Age  . Mother Deceased   . Father Deceased   . Sister Alive   . Brother Deceased   . Sister Deceased   . Brother Deceased   . Brother Alive   . Sister Deceased      There is no immunization history on file for this patient.  No Known Allergies  Medications: Patient's Medications  New Prescriptions   No medications on file  Previous Medications   ALBUTEROL (PROVENTIL HFA;VENTOLIN HFA) 108 (90 BASE) MCG/ACT INHALER    Inhale 2 puffs into the lungs every 4 (four) hours as needed for wheezing or shortness of breath.   ASPIRIN 81 MG CHEWABLE TABLET    Chew 1 tablet (81 mg total) by mouth daily.   ATORVASTATIN (LIPITOR) 40 MG  TABLET    Take 40 mg by mouth every morning.    BENZONATATE (TESSALON) 100 MG CAPSULE    Take 1 capsule (100 mg total) by mouth every 8 (eight) hours.   CALCIUM CARBONATE (TUMS - DOSED IN MG ELEMENTAL CALCIUM) 500 MG CHEWABLE TABLET    Chew 2 tablets (400 mg of elemental calcium total) by mouth every 6 (six) hours as needed for indigestion or heartburn.   CARVEDILOL (COREG) 3.125 MG TABLET    Take 3.125 mg by mouth 2 (two) times daily.   CLOPIDOGREL (PLAVIX) 75 MG TABLET    Take 1 tablet (75 mg total) by mouth daily.   DASATINIB (SPRYCEL) 100 MG TABLET    Take 100 mg by mouth daily.   FUROSEMIDE (LASIX) 80 MG TABLET    Take 1 tablet (80 mg total) by mouth 2 (two) times daily.   ISOSORBIDE-HYDRALAZINE (BIDIL) 20-37.5 MG PER TABLET    Take 0.5 tablets by mouth 3 (three) times daily.   PANTOPRAZOLE (PROTONIX) 40 MG TABLET    Take 1 tablet (40 mg total) by mouth daily.   POLYETHYLENE GLYCOL (MIRALAX / GLYCOLAX) PACKET    Take 17 g by mouth daily.   POTASSIUM CHLORIDE SA (K-DUR,KLOR-CON) 20 MEQ TABLET    Take 1 tablet (20 mEq total) by mouth 2 (two) times daily.  Modified Medications   No medications on file  Discontinued Medications   No medications on file    Review of Systems  Constitutional: Positive for fatigue. Negative for chills and activity change.  HENT: Negative for sore throat and trouble swallowing.   Eyes: Negative for visual disturbance.  Respiratory: Positive for shortness of breath. Negative for cough and chest tightness.   Cardiovascular: Negative for chest pain, palpitations and leg swelling.  Gastrointestinal: Negative for nausea, vomiting, abdominal pain and blood in stool.  Genitourinary: Negative for urgency, frequency and difficulty urinating.  Musculoskeletal: Positive for arthralgias and gait problem (uses cane to ambulate).  Skin: Negative for rash.  Neurological: Negative for headaches.  Psychiatric/Behavioral: Negative for confusion and sleep disturbance. The  patient is not nervous/anxious.     Filed Vitals:   09/24/14 1535  BP: 104/60  Pulse: 86  Temp: 97.7 F (36.5 C)  Weight: 161 lb (73.029 kg)   Body mass index is 24.49 kg/(m^2).  Physical Exam  Constitutional: He is oriented to person, place, and time. He appears well-developed and well-nourished.  Sitting on bed in NAD. No conversational dyspnea.  HENT:  Mouth/Throat: Oropharynx is clear and moist.  Eyes: Pupils are equal, round, and reactive to light. No scleral icterus.  Neck: Neck supple. Carotid  bruit is not present. No thyromegaly present.  Cardiovascular: Normal rate, regular rhythm and intact distal pulses.  Exam reveals no gallop and no friction rub.   Murmur (1/6 SEM) heard. no distal LE swelling. No calf TTP  Pulmonary/Chest: Effort normal and breath sounds normal. He has no wheezes. He has no rales. He exhibits no tenderness.  Abdominal: Soft. Bowel sounds are normal. He exhibits no distension, no abdominal bruit, no pulsatile midline mass and no mass. There is no tenderness. There is no rebound and no guarding.  Musculoskeletal: He exhibits edema.  Uses cane to ambulate  Lymphadenopathy:    He has no cervical adenopathy.  Neurological: He is alert and oriented to person, place, and time. He has normal reflexes.  Left facial droop. Slurred speech  Skin: Skin is warm and dry. No rash noted.  Psychiatric: He has a normal mood and affect. His behavior is normal. Thought content normal. His speech is slurred.     Labs reviewed: Admission on 09/13/2014, Discharged on 09/22/2014  No results displayed because visit has over 200 results.    Admission on 08/08/2014, Discharged on 08/09/2014  Component Date Value Ref Range Status  . WBC 08/08/2014 24.1* 4.0 - 10.5 K/uL Final  . RBC 08/08/2014 3.05* 4.22 - 5.81 MIL/uL Final  . Hemoglobin 08/08/2014 9.1* 13.0 - 17.0 g/dL Final  . HCT 08/08/2014 27.9* 39.0 - 52.0 % Final  . MCV 08/08/2014 91.5  78.0 - 100.0 fL Final  .  MCH 08/08/2014 29.8  26.0 - 34.0 pg Final  . MCHC 08/08/2014 32.6  30.0 - 36.0 g/dL Final  . RDW 08/08/2014 16.8* 11.5 - 15.5 % Final  . Platelets 08/08/2014 1020* 150 - 400 K/uL Final   Comment: REPEATED TO VERIFY PLATELET COUNT CONFIRMED BY SMEAR PLATELETS APPEAR INCREASED CRITICAL VALUE NOTED.  VALUE IS CONSISTENT WITH PREVIOUSLY REPORTED AND CALLED VALUE. CRITICAL RESULT CALLED TO, READ BACK BY AND VERIFIED WITH: E OLSON,RN 2118 08/08/14 WBOND   . Sodium 08/08/2014 137  135 - 145 mmol/L Final  . Potassium 08/08/2014 3.8  3.5 - 5.1 mmol/L Final  . Chloride 08/08/2014 104  96 - 112 mmol/L Final  . CO2 08/08/2014 23  19 - 32 mmol/L Final  . Glucose, Bld 08/08/2014 247* 70 - 99 mg/dL Final  . BUN 08/08/2014 13  6 - 23 mg/dL Final  . Creatinine, Ser 08/08/2014 1.26  0.50 - 1.35 mg/dL Final  . Calcium 08/08/2014 8.5  8.4 - 10.5 mg/dL Final  . GFR calc non Af Amer 08/08/2014 57* >90 mL/min Final  . GFR calc Af Amer 08/08/2014 66* >90 mL/min Final   Comment: (NOTE) The eGFR has been calculated using the CKD EPI equation. This calculation has not been validated in all clinical situations. eGFR's persistently <90 mL/min signify possible Chronic Kidney Disease.   . Anion gap 08/08/2014 10  5 - 15 Final  . Troponin i, poc 08/08/2014 0.03  0.00 - 0.08 ng/mL Final  . Comment 3 08/08/2014          Final   Comment: Due to the release kinetics of cTnI, a negative result within the first hours of the onset of symptoms does not rule out myocardial infarction with certainty. If myocardial infarction is still suspected, repeat the test at appropriate intervals.   Admission on 08/07/2014, Discharged on 08/08/2014  Component Date Value Ref Range Status  . WBC 08/07/2014 28.8* 4.0 - 10.5 K/uL Final  . RBC 08/07/2014 3.63* 4.22 - 5.81 MIL/uL Final  .  Hemoglobin 08/07/2014 10.6* 13.0 - 17.0 g/dL Final  . HCT 08/07/2014 33.6* 39.0 - 52.0 % Final  . MCV 08/07/2014 92.6  78.0 - 100.0 fL Final  .  MCH 08/07/2014 29.2  26.0 - 34.0 pg Final  . MCHC 08/07/2014 31.5  30.0 - 36.0 g/dL Final  . RDW 08/07/2014 16.8* 11.5 - 15.5 % Final  . Platelets 08/07/2014 1048* 150 - 400 K/uL Final   Comment: REPEATED TO VERIFY PLATELET COUNT CONFIRMED BY SMEAR CRITICAL RESULT CALLED TO, READ BACK BY AND VERIFIED WITH: E BIVENS,RN 2038 08/07/14 D BRADLEY   . Sodium 08/07/2014 139  135 - 145 mmol/L Final  . Potassium 08/07/2014 4.0  3.5 - 5.1 mmol/L Final  . Chloride 08/07/2014 106  96 - 112 mmol/L Final  . CO2 08/07/2014 24  19 - 32 mmol/L Final  . Glucose, Bld 08/07/2014 141* 70 - 99 mg/dL Final  . BUN 08/07/2014 13  6 - 23 mg/dL Final  . Creatinine, Ser 08/07/2014 1.27  0.50 - 1.35 mg/dL Final  . Calcium 08/07/2014 9.1  8.4 - 10.5 mg/dL Final  . GFR calc non Af Amer 08/07/2014 56* >90 mL/min Final  . GFR calc Af Amer 08/07/2014 65* >90 mL/min Final   Comment: (NOTE) The eGFR has been calculated using the CKD EPI equation. This calculation has not been validated in all clinical situations. eGFR's persistently <90 mL/min signify possible Chronic Kidney Disease.   . Anion gap 08/07/2014 9  5 - 15 Final  . Troponin i, poc 08/07/2014 0.04  0.00 - 0.08 ng/mL Final  . Comment 3 08/07/2014          Final   Comment: Due to the release kinetics of cTnI, a negative result within the first hours of the onset of symptoms does not rule out myocardial infarction with certainty. If myocardial infarction is still suspected, repeat the test at appropriate intervals.   . Path Review 08/07/2014 MARKEDLY INCREASED PLATELET COUNT   Final   Comment: OF >900,000. PRIMARY HEMATOLOGIC DISORDER NOT EXCLUDED. SUGGEST FULL HEMATOLOGIC EVALUATION, IF CLINICALLY INDICATED. Reviewed by Chrystie Nose. Saralyn Pilar, M.D. 08/08/14.   Appointment on 07/31/2014  Component Date Value Ref Range Status  . WBC 07/31/2014 21.3* 4.0 - 10.3 10e3/uL Final  . NEUT# 07/31/2014 16.0* 1.5 - 6.5 10e3/uL Final  . HGB 07/31/2014 10.3* 13.0 -  17.1 g/dL Final  . HCT 07/31/2014 32.9* 38.4 - 49.9 % Final  . Platelets 07/31/2014 831* 140 - 400 10e3/uL Final  . MCV 07/31/2014 91.9  79.3 - 98.0 fL Final  . MCH 07/31/2014 28.9  27.2 - 33.4 pg Final  . MCHC 07/31/2014 31.4* 32.0 - 36.0 g/dL Final  . RBC 07/31/2014 3.58* 4.20 - 5.82 10e6/uL Final  . RDW 07/31/2014 17.3* 11.0 - 14.6 % Final  . lymph# 07/31/2014 3.6* 0.9 - 3.3 10e3/uL Final  . MONO# 07/31/2014 1.2* 0.1 - 0.9 10e3/uL Final  . Eosinophils Absolute 07/31/2014 0.4  0.0 - 0.5 10e3/uL Final  . Basophils Absolute 07/31/2014 0.0  0.0 - 0.1 10e3/uL Final  . NEUT% 07/31/2014 75.3* 39.0 - 75.0 % Final  . LYMPH% 07/31/2014 16.8  14.0 - 49.0 % Final  . MONO% 07/31/2014 5.9  0.0 - 14.0 % Final  . EOS% 07/31/2014 1.9  0.0 - 7.0 % Final  . BASO% 07/31/2014 0.1  0.0 - 2.0 % Final  . Sodium 07/31/2014 142  136 - 145 mEq/L Final  . Potassium 07/31/2014 4.3  3.5 - 5.1 mEq/L Final  .  Chloride 07/31/2014 110* 98 - 109 mEq/L Final  . CO2 07/31/2014 22  22 - 29 mEq/L Final  . Glucose 07/31/2014 133  70 - 140 mg/dl Final  . BUN 07/31/2014 18.3  7.0 - 26.0 mg/dL Final  . Creatinine 07/31/2014 1.2  0.7 - 1.3 mg/dL Final  . Total Bilirubin 07/31/2014 0.42  0.20 - 1.20 mg/dL Final  . Alkaline Phosphatase 07/31/2014 141  40 - 150 U/L Final  . AST 07/31/2014 64* 5 - 34 U/L Final  . ALT 07/31/2014 87* 0 - 55 U/L Final  . Total Protein 07/31/2014 6.9  6.4 - 8.3 g/dL Final  . Albumin 07/31/2014 3.6  3.5 - 5.0 g/dL Final  . Calcium 07/31/2014 9.2  8.4 - 10.4 mg/dL Final  . Anion Gap 07/31/2014 10  3 - 11 mEq/L Final  . EGFR 07/31/2014 75* >90 ml/min/1.73 m2 Final   eGFR is calculated using the CKD-EPI Creatinine Equation (2009)  . LDH 07/31/2014 366* 125 - 245 U/L Final  . Technologist Review 07/31/2014 Metas and Myelocytes, rare nrbc present   Final  Admission on 07/22/2014, Discharged on 07/22/2014  Component Date Value Ref Range Status  . Troponin i, poc 07/21/2014 0.03  0.00 - 0.08 ng/mL  Final  . Comment 3 07/21/2014          Final   Comment: Due to the release kinetics of cTnI, a negative result within the first hours of the onset of symptoms does not rule out myocardial infarction with certainty. If myocardial infarction is still suspected, repeat the test at appropriate intervals.   . WBC 07/21/2014 11.4* 4.0 - 10.5 K/uL Final  . RBC 07/21/2014 3.51* 4.22 - 5.81 MIL/uL Final  . Hemoglobin 07/21/2014 10.3* 13.0 - 17.0 g/dL Final  . HCT 07/21/2014 32.4* 39.0 - 52.0 % Final  . MCV 07/21/2014 92.3  78.0 - 100.0 fL Final  . MCH 07/21/2014 29.3  26.0 - 34.0 pg Final  . MCHC 07/21/2014 31.8  30.0 - 36.0 g/dL Final  . RDW 07/21/2014 16.7* 11.5 - 15.5 % Final  . Platelets 07/21/2014 597* 150 - 400 K/uL Final  . Sodium 07/21/2014 140  135 - 145 mmol/L Final  . Potassium 07/21/2014 3.5  3.5 - 5.1 mmol/L Final  . Chloride 07/21/2014 104  96 - 112 mmol/L Final  . CO2 07/21/2014 29  19 - 32 mmol/L Final  . Glucose, Bld 07/21/2014 145* 70 - 99 mg/dL Final  . BUN 07/21/2014 9  6 - 23 mg/dL Final  . Creatinine, Ser 07/21/2014 1.17  0.50 - 1.35 mg/dL Final  . Calcium 07/21/2014 9.1  8.4 - 10.5 mg/dL Final  . GFR calc non Af Amer 07/21/2014 62* >90 mL/min Final  . GFR calc Af Amer 07/21/2014 72* >90 mL/min Final   Comment: (NOTE) The eGFR has been calculated using the CKD EPI equation. This calculation has not been validated in all clinical situations. eGFR's persistently <90 mL/min signify possible Chronic Kidney Disease.   . Anion gap 07/21/2014 7  5 - 15 Final  Appointment on 06/26/2014  Component Date Value Ref Range Status  . WBC 06/26/2014 30.8* 4.0 - 10.3 10e3/uL Final  . NEUT# 06/26/2014 24.1* 1.5 - 6.5 10e3/uL Final  . HGB 06/26/2014 10.8* 13.0 - 17.1 g/dL Final  . HCT 06/26/2014 34.6* 38.4 - 49.9 % Final  . Platelets 06/26/2014 859* 140 - 400 10e3/uL Final  . MCV 06/26/2014 94.5  79.3 - 98.0 fL Final  . MCH 06/26/2014 29.5  27.2 - 33.4 pg Final  . MCHC  06/26/2014 31.2* 32.0 - 36.0 g/dL Final  . RBC 06/26/2014 3.66* 4.20 - 5.82 10e6/uL Final  . RDW 06/26/2014 16.9* 11.0 - 14.6 % Final  . lymph# 06/26/2014 4.0* 0.9 - 3.3 10e3/uL Final  . MONO# 06/26/2014 1.6* 0.1 - 0.9 10e3/uL Final  . Eosinophils Absolute 06/26/2014 0.7* 0.0 - 0.5 10e3/uL Final  . Basophils Absolute 06/26/2014 0.5* 0.0 - 0.1 10e3/uL Final  . NEUT% 06/26/2014 78.0* 39.0 - 75.0 % Final  . LYMPH% 06/26/2014 13.0* 14.0 - 49.0 % Final  . MONO% 06/26/2014 5.2  0.0 - 14.0 % Final  . EOS% 06/26/2014 2.2  0.0 - 7.0 % Final  . BASO% 06/26/2014 1.6  0.0 - 2.0 % Final  . nRBC 06/26/2014 1* 0 - 0 % Final  . Sodium 06/26/2014 142  136 - 145 mEq/L Final  . Potassium 06/26/2014 4.1  3.5 - 5.1 mEq/L Final  . Chloride 06/26/2014 108  98 - 109 mEq/L Final  . CO2 06/26/2014 25  22 - 29 mEq/L Final  . Glucose 06/26/2014 174* 70 - 140 mg/dl Final  . BUN 06/26/2014 8.3  7.0 - 26.0 mg/dL Final  . Creatinine 06/26/2014 1.1  0.7 - 1.3 mg/dL Final  . Total Bilirubin 06/26/2014 0.47  0.20 - 1.20 mg/dL Final  . Alkaline Phosphatase 06/26/2014 110  40 - 150 U/L Final  . AST 06/26/2014 15  5 - 34 U/L Final  . ALT 06/26/2014 17  0 - 55 U/L Final  . Total Protein 06/26/2014 7.2  6.4 - 8.3 g/dL Final  . Albumin 06/26/2014 3.6  3.5 - 5.0 g/dL Final  . Calcium 06/26/2014 9.0  8.4 - 10.4 mg/dL Final  . Anion Gap 06/26/2014 9  3 - 11 mEq/L Final  . EGFR 06/26/2014 78* >90 ml/min/1.73 m2 Final   eGFR is calculated using the CKD-EPI Creatinine Equation (2009)  . LDH 06/26/2014 311* 125 - 245 U/L Final  . Technologist Review 06/26/2014 Metas and Myelocytes present, 1% Blast, 1% NRBC   Final    Ct Abdomen Pelvis Wo Contrast  09/13/2014   CLINICAL DATA:  Sepsis.  EXAM: CT ABDOMEN AND PELVIS WITHOUT CONTRAST  TECHNIQUE: Multidetector CT imaging of the abdomen and pelvis was performed following the standard protocol without IV contrast.  COMPARISON:  CT scan of March 18, 2014.  FINDINGS: Visualized lung  bases appear normal. No significant osseous abnormality is noted.  No gallstones are noted. No focal abnormality is noted in the liver, spleen or pancreas on these unenhanced images. Adrenal glands and kidneys appear normal. No hydronephrosis or renal obstruction is noted. No renal or ureteral calculi are noted. The appendix appears normal. There is no evidence of bowel obstruction. Atherosclerosis of abdominal aorta is noted without aneurysm formation. Urinary bladder appears normal. No abnormal fluid collection is noted. No significant adenopathy is noted.  IMPRESSION: No acute abnormality seen in the abdomen or pelvis.   Electronically Signed   By: Marijo Conception, M.D.   On: 09/13/2014 16:17   Dg Chest 2 View (if Patient Has Fever And/or Copd)  09/13/2014   CLINICAL DATA:  Subsequent encounter for coughing congestion with mild Chest pain for couple of months.  EXAM: CHEST  2 VIEW  COMPARISON:  08/08/2014.  FINDINGS: The lungs are clear without focal infiltrate, edema, pneumothorax or pleural effusion. Cardiopericardial silhouette is at upper limits of normal for size. Imaged bony structures of the thorax are intact. Telemetry leads  overlie the chest.  IMPRESSION: Upper normal to mildly enlarged heart.  Otherwise normal exam.   Electronically Signed   By: Misty Stanley M.D.   On: 09/13/2014 11:07   US Abdomen Complete  09/14/2014   CLINICAL DATA:  Transaminitis, abdominal pain for 2 months with nausea and vomiting  EXAM: ULTRASOUND ABDOMEN COMPLETE  COMPARISON:  None ; correlation CT abdomen and pelvis 09/13/2014  FINDINGS: Gallbladder: Diffuse gallbladder wall thickening. No shadowing calculi or sonographic Murphy sign. Minimal dependent sludge.  Common bile duct: Diameter: Normal caliber 2 mm diameter  Liver: No focal abnormalities  IVC: Normal appearance  Pancreas: Normal appearance  Spleen: Normal appearance, 4.8 cm length  Right Kidney: Length: 12.0 cm. Normal morphology without mass or hydronephrosis.   Left Kidney: Length: 11.1 cm. Normal morphology without mass or hydronephrosis.  Abdominal aorta: Proximally normal appearance, mid to distal person she obscured by bowel gas.  Other findings: Tiny amount of ascites adjacent to liver. Tiny RIGHT pleural effusion.  IMPRESSION: Diffuse gallbladder wall thickening and small amount of gallbladder sludge without evidence of stones or sonographic Murphy sign ; unable to exclude acute cholecystitis with this appearance.  If acute cholecystitis is a clinical consideration consider followup hepatobiliary imaging.  Tiny amount of ascites and tiny RIGHT pleural effusion.   Electronically Signed   By: Lavonia Dana M.D.   On: 09/14/2014 16:42   [place] Art/ven Flow Abd Pelv Doppler  09/14/2014   CLINICAL DATA:  Hepatic failure, elevated LFTs  EXAM: DUPLEX ULTRASOUND OF LIVER  TECHNIQUE: Color and duplex Doppler ultrasound was performed to evaluate the hepatic in-flow and out-flow vessels.  COMPARISON:  Abdominal ultrasound 09/14/2014  FINDINGS: Portal Vein Velocities  Main:  30 cm/sec  Right:  29 cm/sec  Left:  34 cm/sec  Hepatic Vein Velocities  Right:  112 cm/sec  Middle:  72 cm/sec  Left:  109 cm/sec  Hepatic Artery Velocity:  148 cm/sec  Splenic Vein Velocity:  12 cm/sec  Varices: Absent  Ascites: Absent  Flow within main, RIGHT, and LEFT portal veins is pulsatile and bidirectional.  No focal hepatic abnormalities visualized.  IMPRESSION: Bidirectional flow within the main, RIGHT, and LEFT portal veins suggesting elevated portal venous pressure.  This can be seen with cirrhosis though this can also be seen with post hepatic causes including elevated RIGHT heart pressures and tricuspid valvular disease  No evidence of venous occlusion.   Electronically Signed   By: Lavonia Dana M.D.   On: 09/14/2014 18:22   Dg Chest Port 1 View  09/18/2014   CLINICAL DATA:  Pulmonary edema  EXAM: PORTABLE CHEST - 1 VIEW  COMPARISON:  09/15/2014  FINDINGS: There is mild unchanged cardiomegaly.  The lungs are clear. There are no large effusions. No pneumothorax.  IMPRESSION: Unchanged cardiomegaly   Electronically Signed   By: Andreas Newport M.D.   On: 09/18/2014 06:57   Dg Chest Port 1 View  09/15/2014   CLINICAL DATA:  Shortness of breath and productive cough for 2 months fourth today, history coronary artery disease, hypertension, smoking, CML  EXAM: PORTABLE CHEST - 1 VIEW  COMPARISON:  Portable exam 0925 hours compared to 09/13/2014  FINDINGS: Enlargement of cardiac silhouette.  Mediastinal contours and pulmonary vascularity normal.  Slight rotation to the RIGHT.  Lungs clear.  No pleural effusion or pneumothorax.  Bones unremarkable.  IMPRESSION: Enlargement of cardiac silhouette.  No acute abnormalities.   Electronically Signed   By: Lavonia Dana M.D.   On: 09/15/2014 11:32  Assessment/Plan   ICD-9-CM ICD-10-CM   1. Cardiomyopathy, ischemic - stable 414.8 I25.5   2. Transaminitis - stable 790.4 R74.0   3. Acute kidney injury - improving 584.9 N17.9   4. CML (chronic myelocytic leukemia) - stable 205.10 C92.90   5. H/O: stroke with residual effects - stable 438.9 I69.30     --check CMP  --congratulated him on smoking cessation and alcohol consumption cessation  --cont current meds as ordered  --f/u with cardio as scheduled  --daily weights and record  --PT/OT as indicated  --GOAL: short term rehab and d/c home when medically appropriate. Communicated with pt and nursing.  --will follow  Bruce [name]  [facility] [address] [place], [place] [address] [phone] Cell (Monday-Friday 8 AM - 5 PM) [phone] After 5 PM and follow prompts

## 2014-09-25 LAB — CULTURE, BLOOD (ROUTINE X 2)
Culture: NO GROWTH
Culture: NO GROWTH

## 2014-09-26 LAB — MISCELLANEOUS TEST

## 2014-09-27 ENCOUNTER — Non-Acute Institutional Stay (SKILLED_NURSING_FACILITY): Payer: Medicare Other | Admitting: Adult Health

## 2014-09-27 DIAGNOSIS — R079 Chest pain, unspecified: Secondary | ICD-10-CM | POA: Diagnosis not present

## 2014-09-27 DIAGNOSIS — C9290 Myeloid leukemia, unspecified, not having achieved remission: Secondary | ICD-10-CM | POA: Diagnosis not present

## 2014-09-27 DIAGNOSIS — D72829 Elevated white blood cell count, unspecified: Secondary | ICD-10-CM

## 2014-09-27 DIAGNOSIS — C9210 Chronic myeloid leukemia, BCR/ABL-positive, not having achieved remission: Secondary | ICD-10-CM

## 2014-09-27 LAB — LEPTOSPIRA AB SCREEN

## 2014-09-30 DIAGNOSIS — I10 Essential (primary) hypertension: Secondary | ICD-10-CM | POA: Diagnosis not present

## 2014-09-30 DIAGNOSIS — E785 Hyperlipidemia, unspecified: Secondary | ICD-10-CM | POA: Diagnosis not present

## 2014-09-30 DIAGNOSIS — G894 Chronic pain syndrome: Secondary | ICD-10-CM | POA: Diagnosis not present

## 2014-09-30 DIAGNOSIS — I509 Heart failure, unspecified: Secondary | ICD-10-CM | POA: Diagnosis not present

## 2014-09-30 DIAGNOSIS — N189 Chronic kidney disease, unspecified: Secondary | ICD-10-CM | POA: Diagnosis not present

## 2014-09-30 LAB — LEPTOSPIRA AB SCREEN

## 2014-10-17 ENCOUNTER — Encounter (HOSPITAL_COMMUNITY): Payer: Self-pay | Admitting: Emergency Medicine

## 2014-10-17 ENCOUNTER — Emergency Department (HOSPITAL_COMMUNITY): Payer: Medicare Other

## 2014-10-17 ENCOUNTER — Emergency Department (HOSPITAL_COMMUNITY)
Admission: EM | Admit: 2014-10-17 | Discharge: 2014-10-17 | Discharge status: Against Medical Advice | Payer: Medicare Other | Attending: Emergency Medicine | Admitting: Emergency Medicine

## 2014-10-17 ENCOUNTER — Ambulatory Visit (HOSPITAL_COMMUNITY): Admission: RE | Admit: 2014-10-17 | Payer: Medicare Other | Source: Ambulatory Visit

## 2014-10-17 DIAGNOSIS — Z79899 Other long term (current) drug therapy: Secondary | ICD-10-CM | POA: Insufficient documentation

## 2014-10-17 DIAGNOSIS — Z7902 Long term (current) use of antithrombotics/antiplatelets: Secondary | ICD-10-CM | POA: Insufficient documentation

## 2014-10-17 DIAGNOSIS — I2510 Atherosclerotic heart disease of native coronary artery without angina pectoris: Secondary | ICD-10-CM | POA: Diagnosis not present

## 2014-10-17 DIAGNOSIS — Z8673 Personal history of transient ischemic attack (TIA), and cerebral infarction without residual deficits: Secondary | ICD-10-CM | POA: Insufficient documentation

## 2014-10-17 DIAGNOSIS — E780 Pure hypercholesterolemia: Secondary | ICD-10-CM | POA: Insufficient documentation

## 2014-10-17 DIAGNOSIS — R5383 Other fatigue: Secondary | ICD-10-CM | POA: Insufficient documentation

## 2014-10-17 DIAGNOSIS — Z7982 Long term (current) use of aspirin: Secondary | ICD-10-CM | POA: Diagnosis not present

## 2014-10-17 DIAGNOSIS — I10 Essential (primary) hypertension: Secondary | ICD-10-CM | POA: Insufficient documentation

## 2014-10-17 DIAGNOSIS — R4781 Slurred speech: Secondary | ICD-10-CM | POA: Insufficient documentation

## 2014-10-17 DIAGNOSIS — R42 Dizziness and giddiness: Secondary | ICD-10-CM | POA: Diagnosis not present

## 2014-10-17 DIAGNOSIS — Z87891 Personal history of nicotine dependence: Secondary | ICD-10-CM | POA: Insufficient documentation

## 2014-10-17 DIAGNOSIS — R531 Weakness: Secondary | ICD-10-CM | POA: Diagnosis not present

## 2014-10-17 DIAGNOSIS — Z862 Personal history of diseases of the blood and blood-forming organs and certain disorders involving the immune mechanism: Secondary | ICD-10-CM | POA: Diagnosis not present

## 2014-10-17 HISTORY — DX: Leukemia, unspecified not having achieved remission: C95.90

## 2014-10-17 LAB — URINALYSIS, ROUTINE W REFLEX MICROSCOPIC
Bilirubin Urine: NEGATIVE
Glucose, UA: NEGATIVE mg/dL
Hgb urine dipstick: NEGATIVE
Ketones, ur: NEGATIVE mg/dL
LEUKOCYTES UA: NEGATIVE
NITRITE: NEGATIVE
PROTEIN: NEGATIVE mg/dL
Specific Gravity, Urine: 1.019 (ref 1.005–1.030)
Urobilinogen, UA: 2 mg/dL — ABNORMAL HIGH (ref 0.0–1.0)
pH: 6.5 (ref 5.0–8.0)

## 2014-10-17 LAB — CBC
HEMATOCRIT: 32.3 % — AB (ref 39.0–52.0)
Hemoglobin: 10.1 g/dL — ABNORMAL LOW (ref 13.0–17.0)
MCH: 29.3 pg (ref 26.0–34.0)
MCHC: 31.3 g/dL (ref 30.0–36.0)
MCV: 93.6 fL (ref 78.0–100.0)
Platelets: 605 10*3/uL — ABNORMAL HIGH (ref 150–400)
RBC: 3.45 MIL/uL — AB (ref 4.22–5.81)
RDW: 18.9 % — ABNORMAL HIGH (ref 11.5–15.5)
WBC: 5.2 10*3/uL (ref 4.0–10.5)

## 2014-10-17 LAB — BASIC METABOLIC PANEL
Anion gap: 7 (ref 5–15)
BUN: 12 mg/dL (ref 6–20)
CO2: 26 mmol/L (ref 22–32)
CREATININE: 1.25 mg/dL — AB (ref 0.61–1.24)
Calcium: 9.2 mg/dL (ref 8.9–10.3)
Chloride: 110 mmol/L (ref 101–111)
GFR calc non Af Amer: 57 mL/min — ABNORMAL LOW (ref >60)
Glucose, Bld: 109 mg/dL — ABNORMAL HIGH (ref 65–99)
POTASSIUM: 3.4 mmol/L — AB (ref 3.5–5.1)
Sodium: 143 mmol/L (ref 135–145)

## 2014-10-17 LAB — I-STAT TROPONIN, ED: TROPONIN I, POC: 0.05 ng/mL (ref 0.00–0.08)

## 2014-10-17 MED ORDER — LORAZEPAM 2 MG/ML IJ SOLN
1.0000 mg | Freq: Once | INTRAMUSCULAR | Status: DC
  Filled 2014-10-17: qty 1

## 2014-10-17 NOTE — ED Provider Notes (Signed)
Care assumed from [facility], PA-C at shift change. Pt here with dizziness/weakness and slurred speech, awaiting MRI. If negative then could go home with close PCP f/up, but if positive then pt will need admission (Dr. Doyle Askew has already heard about pt and would be the admitting doctor if it's before 7pm).  Physical Exam  BP 129/92 mmHg  Pulse 86  Temp(Src) 98.8 F (37.1 C) (Oral)  Resp 25  SpO2 97%  Physical Exam Gen: afebrile, VSS, NAD HEENT: EOMI, MMM Resp: no resp distress CV: rate WNL Abd: appearance normal, nondistended MsK: moving all extremities with ease Neuro: A&O x4, speech goal-oriented.   ED Course  Procedures Results for orders placed or performed during the hospital encounter of 10/17/14  CBC  Result Value Ref Range   WBC 5.2 4.0 - 10.5 K/uL   RBC 3.45 (L) 4.22 - 5.81 MIL/uL   Hemoglobin 10.1 (L) 13.0 - 17.0 g/dL   HCT 32.3 (L) 39.0 - 52.0 %   MCV 93.6 78.0 - 100.0 fL   MCH 29.3 26.0 - 34.0 pg   MCHC 31.3 30.0 - 36.0 g/dL   RDW 18.9 (H) 11.5 - 15.5 %   Platelets 605 (H) 150 - 400 K/uL  Basic metabolic panel  Result Value Ref Range   Sodium 143 135 - 145 mmol/L   Potassium 3.4 (L) 3.5 - 5.1 mmol/L   Chloride 110 101 - 111 mmol/L   CO2 26 22 - 32 mmol/L   Glucose, Bld 109 (H) 65 - 99 mg/dL   BUN 12 6 - 20 mg/dL   Creatinine, Ser 1.25 (H) 0.61 - 1.24 mg/dL   Calcium 9.2 8.9 - 10.3 mg/dL   GFR calc non Af Amer 57 (L) >60 mL/min   GFR calc Af Amer >60 >60 mL/min   Anion gap 7 5 - 15  Urinalysis, Routine w reflex microscopic (not at [facility])  Result Value Ref Range   Color, Urine YELLOW YELLOW   APPearance CLEAR CLEAR   Specific Gravity, Urine 1.019 1.005 - 1.030   pH 6.5 5.0 - 8.0   Glucose, UA NEGATIVE NEGATIVE mg/dL   Hgb urine dipstick NEGATIVE NEGATIVE   Bilirubin Urine NEGATIVE NEGATIVE   Ketones, ur NEGATIVE NEGATIVE mg/dL   Protein, ur NEGATIVE NEGATIVE mg/dL   Urobilinogen, UA 2.0 (H) 0.0 - 1.0 mg/dL   Nitrite NEGATIVE NEGATIVE   Leukocytes, UA  NEGATIVE NEGATIVE  I-stat troponin, ED  Result Value Ref Range   Troponin i, poc 0.05 0.00 - 0.08 ng/mL   Comment 3           Ct Head (brain) Wo Contrast  10/17/2014   CLINICAL DATA:  Dizziness, weakness and slurred speech for 1 day. History of leukemia and multiple CVAs. Initial encounter.  EXAM: CT HEAD WITHOUT CONTRAST  TECHNIQUE: Contiguous axial images were obtained from the base of the skull through the vertex without intravenous contrast.  COMPARISON:  Head CT 03/05/2014.  FINDINGS: There is no evidence of acute intracranial hemorrhage, mass lesion, brain edema or extra-axial fluid collection. The ventricles and subarachnoid spaces are diffusely prominent but stable. There is no CT evidence of acute cortical infarction. Intracranial vascular calcifications are noted.  The visualized paranasal sinuses, mastoid air cells and middle ears are clear. There are chronic deformities of the lamina papyracea bilaterally. The calvarium is intact.  IMPRESSION: Stable head CT demonstrating no acute intracranial findings. Mild generalized atrophy.   Electronically Signed   By: Richardean Sale M.D.   On:  10/17/2014 13:01   Dg Chest Port 1 View  09/18/2014   CLINICAL DATA:  Pulmonary edema  EXAM: PORTABLE CHEST - 1 VIEW  COMPARISON:  09/15/2014  FINDINGS: There is mild unchanged cardiomegaly. The lungs are clear. There are no large effusions. No pneumothorax.  IMPRESSION: Unchanged cardiomegaly   Electronically Signed   By: Andreas Newport M.D.   On: 09/18/2014 06:57     Meds ordered this encounter  Medications  . carvedilol (COREG) 6.25 MG tablet    Sig: Take 6.25 mg by mouth daily.  Marland Kitchen ALPRAZolam (XANAX) 1 MG tablet    Sig: Take 1 mg by mouth at bedtime as needed for anxiety or sleep.  . diphenhydramine-acetaminophen (TYLENOL PM) 25-500 MG TABS    Sig: Take 2 tablets by mouth daily as needed (pain/sleep).  Marland Kitchen oxyCODONE-acetaminophen (PERCOCET/ROXICET) 5-325 MG per tablet    Sig: Take 1 tablet by mouth  every 8 (eight) hours as needed. pain    Refill:  0     MDM:   ICD-9-CM ICD-10-CM   1. Dizziness 780.4 R42   2. Weakness 780.79 R53.1    4:41 PM After a long conversation with the pt, he doesn't want to stay for the MRI even with being offered ativan to help with anxiety, and wants to leave AMA. He understands the risks that if he had a stroke he could potentially die from this. He accepts the risks. Discussed f/up with PCP closely.   Andruw Battie Camprubi-Soms, PA-C 10/17/14 1642  4:49 PM Pt has chosen to stay now.  5:42 PM Now reporting he wants to go home again. Leaving [place], PA-C 10/17/14 1742  Serita Grit, MD 10/19/14 1005

## 2014-10-17 NOTE — ED Provider Notes (Signed)
CSN: [id number]     Arrival date & time 10/17/14  1119 History   First MD Initiated Contact with Patient 10/17/14 1121     Chief Complaint  Patient presents with  . Dizziness  . Weakness     (Consider location/radiation/quality/duration/timing/severity/associated sxs/prior Treatment) HPI Comments: 69 year old male presenting with dizziness 2 days. States he feels very unsteady on his feet, and occasionally has to catch himself from falling. Normally ambulates with a cane. Reports left-sided weakness in both his arm and leg beginning yesterday that has been intermittent, and yesterday evening, he thought his speech was a little slurred. He is currently staying at a hotel, and when he was walking in the hallway today at 7:30 AM, another person asked him if he was okay and told him that his speech was a little bit slurred. No alleviating factors. History of stroke and states this feels similar. Denies CP, SOB, n/v, diaphoresis, extremity numbness or tingling, HA.  Patient is a 69 y.o. male presenting with dizziness and weakness. The history is provided by the patient.  Dizziness Associated symptoms: weakness   Weakness Associated symptoms include fatigue and weakness.    Past Medical History  Diagnosis Date  . Coronary artery disease   . Hypertension   . Hypercholesterolemia   . Stroke     No residual limb weakness.  Walks with cane at baseline.   Marland Kitchen CML (chronic myelocytic leukemia)   . TIA (transient ischemic attack) 05/10/2014  . Bell's palsy   . Leukemia    Past Surgical History  Procedure Laterality Date  . Back surgery    . Hip arthroplasty Right     orif  . Orif forearm fracture Right    Family History  Problem Relation Age of Onset  . Diabetes Mother   . Hypertension Mother   . Diabetes Father   . Hypertension Father   . Diabetes Brother   . Hypertension Brother   . Diabetes Sister   . Hypertension Sister   . Diabetes Brother   . Hypertension Brother   . Diabetes  Sister   . Hypertension Sister    History  Substance Use Topics  . Smoking status: Former Smoker -- 0.50 packs/day for 50 years    Types: Cigarettes  . Smokeless tobacco: Never Used  . Alcohol Use: 0.6 oz/week    1 Cans of beer per week     Comment: daily     Review of Systems  Constitutional: Positive for fatigue.  Neurological: Positive for dizziness, speech difficulty and weakness.  All other systems reviewed and are negative.     Allergies  Review of patient's allergies indicates no known allergies.  Home Medications   Prior to Admission medications   Medication Sig Start Date End Date Taking? Authorizing Provider  albuterol (PROVENTIL HFA;VENTOLIN HFA) 108 (90 BASE) MCG/ACT inhaler Inhale 2 puffs into the lungs every 4 (four) hours as needed for wheezing or shortness of breath. 08/08/14  Yes Noemi Chapel, MD  ALPRAZolam Duanne Moron) 1 MG tablet Take 1 mg by mouth at bedtime as needed for anxiety or sleep.   Yes Historical Provider, MD  atorvastatin (LIPITOR) 40 MG tablet Take 40 mg by mouth every morning.  03/02/14  Yes Historical Provider, MD  benzonatate (TESSALON) 100 MG capsule Take 1 capsule (100 mg total) by mouth every 8 (eight) hours. Patient taking differently: Take 100 mg by mouth every 8 (eight) hours as needed for cough.  08/08/14  Yes Noemi Chapel, MD  carvedilol ([place])  6.25 MG tablet Take 6.25 mg by mouth daily.   Yes Historical Provider, MD  clopidogrel (PLAVIX) 75 MG tablet Take 1 tablet (75 mg total) by mouth daily. 03/21/14  Yes [name], MD  dasatinib (SPRYCEL) 100 MG tablet Take 100 mg by mouth daily.   Yes Historical Provider, MD  diphenhydramine-acetaminophen (TYLENOL PM) 25-500 MG TABS Take 2 tablets by mouth daily as needed (pain/sleep).   Yes Historical Provider, MD  oxyCODONE-acetaminophen (PERCOCET/ROXICET) 5-325 MG per tablet Take 1 tablet by mouth every 8 (eight) hours as needed. pain 09/30/14  Yes Historical Provider, MD  polyethylene glycol  (MIRALAX / GLYCOLAX) packet Take 17 g by mouth daily. Patient taking differently: Take 17 g by mouth daily as needed for moderate constipation.  09/22/14  Yes Hosie Poisson, MD  aspirin 81 MG chewable tablet Chew 1 tablet (81 mg total) by mouth daily. Patient not taking: Reported on 10/17/2014 09/22/14   Hosie Poisson, MD  calcium carbonate (TUMS - DOSED IN MG ELEMENTAL CALCIUM) 500 MG chewable tablet Chew 2 tablets (400 mg of elemental calcium total) by mouth every 6 (six) hours as needed for indigestion or heartburn. Patient not taking: Reported on 10/17/2014 09/22/14   Hosie Poisson, MD  furosemide (LASIX) 80 MG tablet Take 1 tablet (80 mg total) by mouth 2 (two) times daily. Patient not taking: Reported on 10/17/2014 09/22/14   Hosie Poisson, MD  isosorbide-hydrALAZINE (BIDIL) 20-37.5 MG per tablet Take 0.5 tablets by mouth 3 (three) times daily. Patient not taking: Reported on 10/17/2014 09/22/14   Hosie Poisson, MD  pantoprazole (PROTONIX) 40 MG tablet Take 1 tablet (40 mg total) by mouth daily. Patient not taking: Reported on 10/17/2014 09/22/14   Hosie Poisson, MD  potassium chloride SA (K-DUR,KLOR-CON) 20 MEQ tablet Take 1 tablet (20 mEq total) by mouth 2 (two) times daily. Patient not taking: Reported on 10/17/2014 07/31/14   Truitt Merle, MD   BP 129/92 mmHg  Pulse 86  Temp(Src) 98.8 F (37.1 C) (Oral)  Resp 25  SpO2 97% Physical Exam  Constitutional: He is oriented to person, place, and time. He appears well-developed and well-nourished. No distress.  HENT:  Head: Normocephalic and atraumatic.  Mouth/Throat: Oropharynx is clear and moist.  Eyes: Conjunctivae and EOM are normal. Pupils are equal, round, and reactive to light.  Neck: Normal range of motion. Neck supple.  Cardiovascular: Normal rate, regular rhythm, normal heart sounds and intact distal pulses.   Pulmonary/Chest: Effort normal and breath sounds normal. No respiratory distress.  Abdominal: Soft. Bowel sounds are normal. There is no  tenderness.  Musculoskeletal: Normal range of motion. He exhibits no edema.  Neurological: He is alert and oriented to person, place, and time. He has normal strength. No cranial nerve deficit or sensory deficit. He displays a negative Romberg sign. Coordination normal.  Speech fluent, goal oriented. Moves extremities without ataxia. Normal finger-to-nose bilateral. No facial asymmetry.  Skin: Skin is warm and dry. He is not diaphoretic.  Psychiatric: He has a normal mood and affect. His behavior is normal.  Nursing note and vitals reviewed.   ED Course  Procedures (including critical care time) Labs Review Labs Reviewed  CBC - Abnormal; Notable for the following:    RBC 3.45 (*)    Hemoglobin 10.1 (*)    HCT 32.3 (*)    RDW 18.9 (*)    Platelets 605 (*)    All other components within normal limits  BASIC METABOLIC PANEL - Abnormal; Notable for the following:  Potassium 3.4 (*)    Glucose, Bld 109 (*)    Creatinine, Ser 1.25 (*)    GFR calc non Af Amer 57 (*)    All other components within normal limits  URINALYSIS, ROUTINE W REFLEX MICROSCOPIC (NOT AT [facility]) - Abnormal; Notable for the following:    Urobilinogen, UA 2.0 (*)    All other components within normal limits  I-STAT TROPOININ, ED    Imaging Review Ct Head (brain) Wo Contrast  10/17/2014   CLINICAL DATA:  Dizziness, weakness and slurred speech for 1 day. History of leukemia and multiple CVAs. Initial encounter.  EXAM: CT HEAD WITHOUT CONTRAST  TECHNIQUE: Contiguous axial images were obtained from the base of the skull through the vertex without intravenous contrast.  COMPARISON:  Head CT 03/05/2014.  FINDINGS: There is no evidence of acute intracranial hemorrhage, mass lesion, brain edema or extra-axial fluid collection. The ventricles and subarachnoid spaces are diffusely prominent but stable. There is no CT evidence of acute cortical infarction. Intracranial vascular calcifications are noted.  The visualized paranasal  sinuses, mastoid air cells and middle ears are clear. There are chronic deformities of the lamina papyracea bilaterally. The calvarium is intact.  IMPRESSION: Stable head CT demonstrating no acute intracranial findings. Mild generalized atrophy.   Electronically Signed   By: Richardean Sale M.D.   On: 10/17/2014 13:01     EKG Interpretation   Date/Time:  Thursday October 17 2014 11:27:14 EDT Ventricular Rate:  94 PR Interval:  176 QRS Duration: 100 QT Interval:  399 QTC Calculation: 499 R Axis:   -3 Text Interpretation:  Sinus rhythm Anteroseptal infarct, old Nonspecific T  abnormalities, lateral leads Baseline wander in lead(s) V6 nonspecific t  wave abnormality less prominent Confirmed by [facility]  MD, TREY ([id number]) on  10/17/2014 1:16:28 PM      MDM   Final diagnoses:  Dizziness   Non-toxic appearing, NAD. AFVSS. No slurred speech or focal neurologic deficits on initial exam. Workup negative. Possible TIA. History of stroke. Records reviewed, had normal carotid Dopplers in February 2016. Had an echo June 2016 documented. Sees Dr. Terrence Dupont for cardiology and PCP. On repeat exam by Dr. Doy Mince, pt has slight slurred speech. I spoke with Dr. Doyle Askew, [facility], who suggests MRI before admission, as if it is normal, the pt can be discharged home given no other acute conditions at this time. MRI pending. If positive finding on MRI, page Dr. Doyle Askew directly at [phone]. Pt signed out to [employer], PA-C at shift change.  Discussed with attending Dr. Doy Mince who also evaluated patient and agrees with plan of care.  Carman Ching, PA-C 10/17/14 [place], MD 10/19/14 1004

## 2014-10-17 NOTE — ED Notes (Signed)
Pt has requested to leave AMA.  PA has been notified.  Pt is in no acute distress and neuro symptoms (slurred speech, dizziness, weakness) have resolved and pt has returned to what he describes as his baseline.

## 2014-10-17 NOTE — ED Notes (Addendum)
Pt with Hx of leukemia and multiple CVAs c/o dizziness and weakness onset yesterday and slurred speech first noticed today at 0730. Pt states he does not know if his speech was normal before then because he didn't speak to anyone. Speech does not appear slurred at this time. Pt ambulatory without ataxia at this time. Pt denies any radiation at this time, states that he takes chemo pills only.

## 2014-10-17 NOTE — ED Notes (Signed)
Asked for a urine sample pt can't provide one at this time

## 2014-10-17 NOTE — Discharge Instructions (Signed)
You are choosing to leave against medical advice. This means you understand that you could have had a stroke or could potentially have a complication from leaving here without getting the MRI, and you accept these risks. You will need to follow up with your regular doctor closely in order to have ongoing evaluation of your condition.

## 2014-10-17 NOTE — ED Notes (Signed)
RN discussed with pt the risks of leaving without diagnostic MRI, and he eventually agreed to the exam with the request the he receive medication beforehand for his claustrophobia.  PA made aware of pt's wishes.

## 2014-10-29 DIAGNOSIS — N189 Chronic kidney disease, unspecified: Secondary | ICD-10-CM | POA: Diagnosis not present

## 2014-10-29 DIAGNOSIS — G894 Chronic pain syndrome: Secondary | ICD-10-CM | POA: Diagnosis not present

## 2014-10-29 DIAGNOSIS — I10 Essential (primary) hypertension: Secondary | ICD-10-CM | POA: Diagnosis not present

## 2014-10-29 DIAGNOSIS — E785 Hyperlipidemia, unspecified: Secondary | ICD-10-CM | POA: Diagnosis not present

## 2014-10-29 DIAGNOSIS — I5020 Unspecified systolic (congestive) heart failure: Secondary | ICD-10-CM | POA: Diagnosis not present

## 2014-10-31 ENCOUNTER — Encounter (HOSPITAL_COMMUNITY): Payer: Self-pay | Admitting: Emergency Medicine

## 2014-10-31 ENCOUNTER — Emergency Department (HOSPITAL_COMMUNITY)
Admission: EM | Admit: 2014-10-31 | Discharge: 2014-10-31 | Disposition: A | Discharge status: Home / Self Care | Payer: Medicare Other | Attending: Emergency Medicine | Admitting: Emergency Medicine

## 2014-10-31 DIAGNOSIS — Z87891 Personal history of nicotine dependence: Secondary | ICD-10-CM | POA: Insufficient documentation

## 2014-10-31 DIAGNOSIS — Z856 Personal history of leukemia: Secondary | ICD-10-CM | POA: Diagnosis not present

## 2014-10-31 DIAGNOSIS — Z79899 Other long term (current) drug therapy: Secondary | ICD-10-CM | POA: Diagnosis not present

## 2014-10-31 DIAGNOSIS — R2242 Localized swelling, mass and lump, left lower limb: Secondary | ICD-10-CM | POA: Insufficient documentation

## 2014-10-31 DIAGNOSIS — I2510 Atherosclerotic heart disease of native coronary artery without angina pectoris: Secondary | ICD-10-CM | POA: Insufficient documentation

## 2014-10-31 DIAGNOSIS — M7989 Other specified soft tissue disorders: Secondary | ICD-10-CM | POA: Diagnosis present

## 2014-10-31 DIAGNOSIS — R609 Edema, unspecified: Secondary | ICD-10-CM

## 2014-10-31 DIAGNOSIS — R600 Localized edema: Secondary | ICD-10-CM | POA: Diagnosis not present

## 2014-10-31 DIAGNOSIS — I10 Essential (primary) hypertension: Secondary | ICD-10-CM | POA: Diagnosis not present

## 2014-10-31 DIAGNOSIS — R2241 Localized swelling, mass and lump, right lower limb: Secondary | ICD-10-CM | POA: Insufficient documentation

## 2014-10-31 DIAGNOSIS — Z8669 Personal history of other diseases of the nervous system and sense organs: Secondary | ICD-10-CM | POA: Insufficient documentation

## 2014-10-31 DIAGNOSIS — Z8673 Personal history of transient ischemic attack (TIA), and cerebral infarction without residual deficits: Secondary | ICD-10-CM | POA: Diagnosis not present

## 2014-10-31 DIAGNOSIS — E780 Pure hypercholesterolemia: Secondary | ICD-10-CM | POA: Diagnosis not present

## 2014-10-31 DIAGNOSIS — Z7902 Long term (current) use of antithrombotics/antiplatelets: Secondary | ICD-10-CM | POA: Insufficient documentation

## 2014-10-31 LAB — I-STAT CHEM 8, ED
BUN: 21 mg/dL — ABNORMAL HIGH (ref 6–20)
Calcium, Ion: 1.16 mmol/L (ref 1.13–1.30)
Chloride: 105 mmol/L (ref 101–111)
Creatinine, Ser: 1.9 mg/dL — ABNORMAL HIGH (ref 0.61–1.24)
GLUCOSE: 117 mg/dL — AB (ref 65–99)
HEMATOCRIT: 37 % — AB (ref 39.0–52.0)
HEMOGLOBIN: 12.6 g/dL — AB (ref 13.0–17.0)
POTASSIUM: 3.3 mmol/L — AB (ref 3.5–5.1)
SODIUM: 138 mmol/L (ref 135–145)
TCO2: 20 mmol/L (ref 0–100)

## 2014-10-31 MED ORDER — FUROSEMIDE 10 MG/ML IJ SOLN
80.0000 mg | Freq: Once | INTRAMUSCULAR | Status: AC
  Administered 2014-10-31: 80 mg via INTRAVENOUS
  Filled 2014-10-31: qty 8

## 2014-10-31 NOTE — ED Notes (Signed)
Pt given ice cream

## 2014-10-31 NOTE — ED Notes (Signed)
Pt c/o bilat feet and lower leg swelling that started last night.  Pt states that he has had this happen before but wasn't given any medications for it, but knows something must be wrong.

## 2014-10-31 NOTE — Discharge Instructions (Signed)
Take your lasix pills to get the swelling to improve.  Follow up with your md next week

## 2014-11-01 NOTE — ED Provider Notes (Signed)
CSN: [id number]     Arrival date & time 10/31/14  1252 History   First MD Initiated Contact with Patient 10/31/14 1418     Chief Complaint  Patient presents with  . Foot Swelling  . Leg Swelling     (Consider location/radiation/quality/duration/timing/severity/associated sxs/prior Treatment) Patient is a 69 y.o. male presenting with leg pain. The history is provided by the patient (the pt complains of swelling to both legs.  he has not been taking his lasix).  Leg Pain Lower extremity pain location: both lower legs. Pain details:    Quality:  Aching   Radiates to:  Does not radiate   Severity:  Moderate   Onset quality:  Gradual   Timing:  Constant   Progression:  Waxing and waning Associated symptoms: no back pain and no fatigue     Past Medical History  Diagnosis Date  . Coronary artery disease   . Hypertension   . Hypercholesterolemia   . Stroke     No residual limb weakness.  Walks with cane at baseline.   Marland Kitchen CML (chronic myelocytic leukemia)   . TIA (transient ischemic attack) 05/10/2014  . Bell's palsy   . Leukemia    Past Surgical History  Procedure Laterality Date  . Back surgery    . Hip arthroplasty Right     orif  . Orif forearm fracture Right    Family History  Problem Relation Age of Onset  . Diabetes Mother   . Hypertension Mother   . Diabetes Father   . Hypertension Father   . Diabetes Brother   . Hypertension Brother   . Diabetes Sister   . Hypertension Sister   . Diabetes Brother   . Hypertension Brother   . Diabetes Sister   . Hypertension Sister    History  Substance Use Topics  . Smoking status: Former Smoker -- 0.50 packs/day for 50 years    Types: Cigarettes  . Smokeless tobacco: Never Used  . Alcohol Use: 0.6 oz/week    1 Cans of beer per week     Comment: daily     Review of Systems  Constitutional: Negative for appetite change and fatigue.  HENT: Negative for congestion, ear discharge and sinus pressure.   Eyes: Negative  for discharge.  Respiratory: Negative for cough.   Cardiovascular: Negative for chest pain.  Gastrointestinal: Negative for abdominal pain and diarrhea.  Genitourinary: Negative for frequency and hematuria.  Musculoskeletal: Negative for back pain.       Leg swelling  Skin: Negative for rash.  Neurological: Negative for seizures and headaches.  Psychiatric/Behavioral: Negative for hallucinations.      Allergies  Review of patient's allergies indicates no known allergies.  Home Medications   Prior to Admission medications   Medication Sig Start Date End Date Taking? Authorizing Provider  albuterol (PROVENTIL HFA;VENTOLIN HFA) 108 (90 BASE) MCG/ACT inhaler Inhale 2 puffs into the lungs every 4 (four) hours as needed for wheezing or shortness of breath. 08/08/14  Yes Noemi Chapel, MD  ALPRAZolam Duanne Moron) 0.5 MG tablet Take 0.5 mg by mouth at bedtime as needed for anxiety.   Yes Historical Provider, MD  atorvastatin (LIPITOR) 40 MG tablet Take 40 mg by mouth every morning.  03/02/14  Yes Historical Provider, MD  benzonatate (TESSALON) 100 MG capsule Take 1 capsule (100 mg total) by mouth every 8 (eight) hours. Patient taking differently: Take 100 mg by mouth every 8 (eight) hours as needed for cough.  08/08/14  Yes Noemi Chapel,  MD  carvedilol (COREG) 6.25 MG tablet Take 6.25 mg by mouth daily.   Yes Historical Provider, MD  clopidogrel (PLAVIX) 75 MG tablet Take 1 tablet (75 mg total) by mouth daily. 03/21/14  Yes [name], MD  diphenhydramine-acetaminophen (TYLENOL PM) 25-500 MG TABS Take 2 tablets by mouth daily as needed (pain/sleep).   Yes Historical Provider, MD  furosemide (LASIX) 20 MG tablet Take 20 mg by mouth daily.   Yes Historical Provider, MD  oxyCODONE-acetaminophen (PERCOCET/ROXICET) 5-325 MG per tablet Take 1 tablet by mouth every 8 (eight) hours as needed. pain 09/30/14  Yes Historical Provider, MD  polyethylene glycol (MIRALAX / GLYCOLAX) packet Take 17 g by mouth  daily. Patient taking differently: Take 17 g by mouth daily as needed for moderate constipation.  09/22/14  Yes Hosie Poisson, MD  aspirin 81 MG chewable tablet Chew 1 tablet (81 mg total) by mouth daily. Patient not taking: Reported on 10/17/2014 09/22/14   Hosie Poisson, MD  calcium carbonate (TUMS - DOSED IN MG ELEMENTAL CALCIUM) 500 MG chewable tablet Chew 2 tablets (400 mg of elemental calcium total) by mouth every 6 (six) hours as needed for indigestion or heartburn. Patient not taking: Reported on 10/17/2014 09/22/14   Hosie Poisson, MD  furosemide (LASIX) 80 MG tablet Take 1 tablet (80 mg total) by mouth 2 (two) times daily. Patient not taking: Reported on 10/17/2014 09/22/14   Hosie Poisson, MD  isosorbide-hydrALAZINE (BIDIL) 20-37.5 MG per tablet Take 0.5 tablets by mouth 3 (three) times daily. Patient not taking: Reported on 10/17/2014 09/22/14   Hosie Poisson, MD  pantoprazole (PROTONIX) 40 MG tablet Take 1 tablet (40 mg total) by mouth daily. Patient not taking: Reported on 10/17/2014 09/22/14   Hosie Poisson, MD  potassium chloride SA (K-DUR,KLOR-CON) 20 MEQ tablet Take 1 tablet (20 mEq total) by mouth 2 (two) times daily. Patient not taking: Reported on 10/31/2014 07/31/14   Truitt Merle, MD   BP 133/86 mmHg  Pulse 79  Temp(Src) 97.6 F (36.4 C) (Oral)  Resp 18  SpO2 98% Physical Exam  Constitutional: He is oriented to person, place, and time. He appears well-developed.  HENT:  Head: Normocephalic.  Eyes: Conjunctivae and EOM are normal. No scleral icterus.  Neck: Neck supple. No thyromegaly present.  Cardiovascular: Normal rate and regular rhythm.  Exam reveals no gallop and no friction rub.   No murmur heard. Pulmonary/Chest: No stridor. He has no wheezes. He has no rales. He exhibits no tenderness.  Abdominal: He exhibits no distension. There is no tenderness. There is no rebound.  Musculoskeletal: Normal range of motion. He exhibits edema.  2 plus edema both lower legs  Lymphadenopathy:     He has no cervical adenopathy.  Neurological: He is oriented to person, place, and time. He exhibits normal muscle tone. Coordination normal.  Skin: No rash noted. No erythema.  Psychiatric: He has a normal mood and affect. His behavior is normal.    ED Course  Procedures (including critical care time) Labs Review Labs Reviewed  I-STAT CHEM 8, ED - Abnormal; Notable for the following:    Potassium 3.3 (*)    BUN 21 (*)    Creatinine, Ser 1.90 (*)    Glucose, Bld 117 (*)    Hemoglobin 12.6 (*)    HCT 37.0 (*)    All other components within normal limits    Imaging Review No results found.   EKG Interpretation None      MDM   Final diagnoses:  Edema    Edema from chf.  Pt will start back on lasix and follow up with his md    Milton Ferguson, MD 11/01/14 [phone]

## 2014-11-15 DIAGNOSIS — J449 Chronic obstructive pulmonary disease, unspecified: Secondary | ICD-10-CM | POA: Diagnosis not present

## 2014-11-15 DIAGNOSIS — Z87891 Personal history of nicotine dependence: Secondary | ICD-10-CM | POA: Diagnosis not present

## 2014-11-15 DIAGNOSIS — R42 Dizziness and giddiness: Secondary | ICD-10-CM | POA: Diagnosis not present

## 2014-11-15 DIAGNOSIS — F419 Anxiety disorder, unspecified: Secondary | ICD-10-CM | POA: Diagnosis not present

## 2014-11-15 DIAGNOSIS — E8770 Fluid overload, unspecified: Secondary | ICD-10-CM | POA: Diagnosis not present

## 2014-11-15 DIAGNOSIS — R9431 Abnormal electrocardiogram [ECG] [EKG]: Secondary | ICD-10-CM | POA: Diagnosis not present

## 2014-11-15 DIAGNOSIS — I519 Heart disease, unspecified: Secondary | ICD-10-CM | POA: Diagnosis not present

## 2014-11-15 DIAGNOSIS — Z7982 Long term (current) use of aspirin: Secondary | ICD-10-CM | POA: Diagnosis not present

## 2014-11-15 DIAGNOSIS — R471 Dysarthria and anarthria: Secondary | ICD-10-CM | POA: Diagnosis not present

## 2014-11-15 DIAGNOSIS — I10 Essential (primary) hypertension: Secondary | ICD-10-CM | POA: Diagnosis not present

## 2014-11-15 DIAGNOSIS — J811 Chronic pulmonary edema: Secondary | ICD-10-CM | POA: Diagnosis not present

## 2014-11-15 DIAGNOSIS — Z833 Family history of diabetes mellitus: Secondary | ICD-10-CM | POA: Diagnosis not present

## 2014-11-15 DIAGNOSIS — I69392 Facial weakness following cerebral infarction: Secondary | ICD-10-CM | POA: Diagnosis not present

## 2014-11-15 DIAGNOSIS — F1410 Cocaine abuse, uncomplicated: Secondary | ICD-10-CM | POA: Diagnosis not present

## 2014-11-15 DIAGNOSIS — F329 Major depressive disorder, single episode, unspecified: Secondary | ICD-10-CM | POA: Diagnosis not present

## 2014-11-15 DIAGNOSIS — F1210 Cannabis abuse, uncomplicated: Secondary | ICD-10-CM | POA: Diagnosis not present

## 2014-11-15 DIAGNOSIS — R05 Cough: Secondary | ICD-10-CM | POA: Diagnosis not present

## 2014-11-15 DIAGNOSIS — I248 Other forms of acute ischemic heart disease: Secondary | ICD-10-CM | POA: Diagnosis not present

## 2014-11-15 DIAGNOSIS — I252 Old myocardial infarction: Secondary | ICD-10-CM | POA: Diagnosis not present

## 2014-11-15 DIAGNOSIS — I429 Cardiomyopathy, unspecified: Secondary | ICD-10-CM | POA: Diagnosis not present

## 2014-11-15 DIAGNOSIS — I5023 Acute on chronic systolic (congestive) heart failure: Secondary | ICD-10-CM | POA: Diagnosis not present

## 2014-11-15 DIAGNOSIS — Z9114 Patient's other noncompliance with medication regimen: Secondary | ICD-10-CM | POA: Diagnosis not present

## 2014-11-15 DIAGNOSIS — I214 Non-ST elevation (NSTEMI) myocardial infarction: Secondary | ICD-10-CM | POA: Diagnosis not present

## 2014-11-15 DIAGNOSIS — I279 Pulmonary heart disease, unspecified: Secondary | ICD-10-CM | POA: Diagnosis not present

## 2014-11-15 DIAGNOSIS — C9290 Myeloid leukemia, unspecified, not having achieved remission: Secondary | ICD-10-CM | POA: Diagnosis not present

## 2014-11-15 DIAGNOSIS — F1010 Alcohol abuse, uncomplicated: Secondary | ICD-10-CM | POA: Diagnosis not present

## 2014-11-15 DIAGNOSIS — R0989 Other specified symptoms and signs involving the circulatory and respiratory systems: Secondary | ICD-10-CM | POA: Diagnosis not present

## 2014-11-15 DIAGNOSIS — M7989 Other specified soft tissue disorders: Secondary | ICD-10-CM | POA: Diagnosis not present

## 2014-11-15 DIAGNOSIS — J90 Pleural effusion, not elsewhere classified: Secondary | ICD-10-CM | POA: Diagnosis not present

## 2014-11-15 DIAGNOSIS — I361 Nonrheumatic tricuspid (valve) insufficiency: Secondary | ICD-10-CM | POA: Diagnosis not present

## 2014-11-15 DIAGNOSIS — I509 Heart failure, unspecified: Secondary | ICD-10-CM | POA: Diagnosis not present

## 2014-11-15 DIAGNOSIS — R51 Headache: Secondary | ICD-10-CM | POA: Diagnosis not present

## 2014-11-15 DIAGNOSIS — I272 Other secondary pulmonary hypertension: Secondary | ICD-10-CM | POA: Diagnosis not present

## 2014-11-15 DIAGNOSIS — R778 Other specified abnormalities of plasma proteins: Secondary | ICD-10-CM | POA: Diagnosis not present

## 2014-11-15 DIAGNOSIS — R944 Abnormal results of kidney function studies: Secondary | ICD-10-CM | POA: Diagnosis not present

## 2014-11-15 DIAGNOSIS — C9210 Chronic myeloid leukemia, BCR/ABL-positive, not having achieved remission: Secondary | ICD-10-CM | POA: Diagnosis not present

## 2014-11-15 DIAGNOSIS — F1020 Alcohol dependence, uncomplicated: Secondary | ICD-10-CM | POA: Diagnosis not present

## 2014-11-15 DIAGNOSIS — I517 Cardiomegaly: Secondary | ICD-10-CM | POA: Diagnosis not present

## 2014-11-15 DIAGNOSIS — I2510 Atherosclerotic heart disease of native coronary artery without angina pectoris: Secondary | ICD-10-CM | POA: Diagnosis not present

## 2014-11-15 DIAGNOSIS — J9811 Atelectasis: Secondary | ICD-10-CM | POA: Diagnosis not present

## 2014-11-15 DIAGNOSIS — R490 Dysphonia: Secondary | ICD-10-CM | POA: Diagnosis not present

## 2014-12-03 NOTE — Progress Notes (Signed)
Patient ID: Bruce [name], male   DOB: 04/19/1945, 69 y.o.   MRN: [id number]    Facility: [facility]      No Known Allergies  Chief Complaint  Patient presents with  . Discharge Note    HPI:  He is being discharged to home. He will not need home health and will not need dme. He will need his prescriptions to be written. He will need a follow up with his pcp. He had been hospitalized for sepsis and chest pain; CML.  He was admitted to this facility for short term rehab; is now being discharged to home.    Past Medical History  Diagnosis Date  . Coronary artery disease   . Hypertension   . Hypercholesterolemia   . Stroke     No residual limb weakness.  Walks with cane at baseline.   Marland Kitchen CML (chronic myelocytic leukemia)   . TIA (transient ischemic attack) 05/10/2014  . Bell's palsy   . Leukemia     Past Surgical History  Procedure Laterality Date  . Back surgery    . Hip arthroplasty Right     orif  . Orif forearm fracture Right     VITAL SIGNS BP 139/80 mmHg  Pulse 79  Ht 5\' 8"  (1.727 m)  Wt 161 lb (73.029 kg)  BMI 24.49 kg/m2  Patient's Medications  New Prescriptions   No medications on file  ALBUTEROL (PROVENTIL HFA;VENTOLIN HFA) 108 (90 BASE) MCG/ACT INHALER    Inhale 2 puffs into the lungs every 4 (four) hours as needed for wheezing or shortness of breath.  ASPIRIN 81 MG CHEWABLE TABLET    Chew 1 tablet (81 mg total) by mouth daily.  ATORVASTATIN (LIPITOR) 40 MG TABLET    Take 40 mg by mouth every morning.   BENZONATATE (TESSALON) 100 MG CAPSULE    Take 1 capsule (100 mg total) by mouth every 8 (eight) hours.  CALCIUM CARBONATE (TUMS - DOSED IN MG ELEMENTAL CALCIUM) 500 MG CHEWABLE TABLET    Chew 2 tablets (400 mg of elemental calcium total) by mouth every 6 (six) hours as needed for indigestion or heartburn.  CARVEDILOL (COREG) 3.125 MG TABLET    Take 3.125 mg by mouth 2 (two) times daily.  CLOPIDOGREL (PLAVIX) 75 MG TABLET    Take 1 tablet  (75 mg total) by mouth daily.  DASATINIB (SPRYCEL) 100 MG TABLET    Take 100 mg by mouth daily.  FUROSEMIDE (LASIX) 80 MG TABLET    Take 1 tablet (80 mg total) by mouth 2 (two) times daily.  ISOSORBIDE-HYDRALAZINE (BIDIL) 20-37.5 MG PER TABLET    Take 0.5 tablets by mouth 3 (three) times daily.  PANTOPRAZOLE (PROTONIX) 40 MG TABLET    Take 1 tablet (40 mg total) by mouth daily.  POLYETHYLENE GLYCOL (MIRALAX / GLYCOLAX) PACKET    Take 17 g by mouth daily.  POTASSIUM CHLORIDE SA (K-DUR,KLOR-CON) 20 MEQ TABLET    Take 1 tablet (20 mEq total) by mouth 2 (two) times daily.    Previous Medications  Modified Medications   No medications on file  Discontinued Medications   No medications on file     SIGNIFICANT DIAGNOSTIC EXAMS    Review of Systems  Constitutional: Negative for appetite change and fatigue.  HENT: Negative for congestion.   Respiratory: Negative for cough, chest tightness and shortness of breath.   Cardiovascular: Negative for chest pain, palpitations and leg swelling.  Gastrointestinal: Negative for nausea, abdominal pain, diarrhea and constipation.  Musculoskeletal: Negative for myalgias and arthralgias.  Skin: Negative for pallor.  Neurological: Negative for dizziness.  Psychiatric/Behavioral: The patient is not nervous/anxious.       Physical Exam  Constitutional: No distress.  Eyes: Conjunctivae are normal.  Neck: Neck supple. No JVD present. No thyromegaly present.  Cardiovascular: Normal rate, regular rhythm and intact distal pulses.   Respiratory: Effort normal and breath sounds normal. No respiratory distress. He has no wheezes.  GI: Soft. Bowel sounds are normal. He exhibits no distension. There is no tenderness.  Musculoskeletal: He exhibits no edema.  Able to move all extremities   Lymphadenopathy:    He has no cervical adenopathy.  Neurological: He is alert.  Skin: Skin is warm and dry. He is not diaphoretic.  Psychiatric: He has a normal mood and  affect.       ASSESSMENT/ PLAN:  Will discharge to home. He will not need home health and will not need dme. His prescriptions have been written for a 30 day supply of his medications. He has a follow up with Dr. Terrence Dupont 09-30-14 at 3:15 pm    Time spent with patient  40   minutes >50% time spent counseling; reviewing medical record; tests; labs; and developing future plan of care   Ok Edwards NP [facility]  Contact [phone] Monday through Friday 8am- 5pm  After hours call [phone]

## 2014-12-04 DIAGNOSIS — I272 Other secondary pulmonary hypertension: Secondary | ICD-10-CM | POA: Diagnosis not present

## 2014-12-04 DIAGNOSIS — Z87891 Personal history of nicotine dependence: Secondary | ICD-10-CM | POA: Diagnosis not present

## 2014-12-04 DIAGNOSIS — F329 Major depressive disorder, single episode, unspecified: Secondary | ICD-10-CM | POA: Diagnosis not present

## 2014-12-04 DIAGNOSIS — R05 Cough: Secondary | ICD-10-CM | POA: Diagnosis not present

## 2014-12-04 DIAGNOSIS — R072 Precordial pain: Secondary | ICD-10-CM | POA: Diagnosis not present

## 2014-12-04 DIAGNOSIS — I10 Essential (primary) hypertension: Secondary | ICD-10-CM | POA: Diagnosis not present

## 2014-12-04 DIAGNOSIS — I252 Old myocardial infarction: Secondary | ICD-10-CM | POA: Diagnosis not present

## 2014-12-04 DIAGNOSIS — F419 Anxiety disorder, unspecified: Secondary | ICD-10-CM | POA: Diagnosis not present

## 2014-12-04 DIAGNOSIS — K649 Unspecified hemorrhoids: Secondary | ICD-10-CM | POA: Diagnosis not present

## 2014-12-04 DIAGNOSIS — I959 Hypotension, unspecified: Secondary | ICD-10-CM | POA: Diagnosis not present

## 2014-12-04 DIAGNOSIS — D539 Nutritional anemia, unspecified: Secondary | ICD-10-CM | POA: Diagnosis not present

## 2014-12-04 DIAGNOSIS — R55 Syncope and collapse: Secondary | ICD-10-CM | POA: Diagnosis not present

## 2014-12-04 DIAGNOSIS — D649 Anemia, unspecified: Secondary | ICD-10-CM | POA: Diagnosis not present

## 2014-12-04 DIAGNOSIS — F1490 Cocaine use, unspecified, uncomplicated: Secondary | ICD-10-CM | POA: Diagnosis not present

## 2014-12-04 DIAGNOSIS — R0602 Shortness of breath: Secondary | ICD-10-CM | POA: Diagnosis not present

## 2014-12-04 DIAGNOSIS — Z8673 Personal history of transient ischemic attack (TIA), and cerebral infarction without residual deficits: Secondary | ICD-10-CM | POA: Diagnosis not present

## 2014-12-04 DIAGNOSIS — I509 Heart failure, unspecified: Secondary | ICD-10-CM | POA: Diagnosis not present

## 2014-12-04 DIAGNOSIS — I429 Cardiomyopathy, unspecified: Secondary | ICD-10-CM | POA: Diagnosis not present

## 2014-12-04 DIAGNOSIS — J449 Chronic obstructive pulmonary disease, unspecified: Secondary | ICD-10-CM | POA: Diagnosis not present

## 2014-12-04 DIAGNOSIS — F1290 Cannabis use, unspecified, uncomplicated: Secondary | ICD-10-CM | POA: Diagnosis not present

## 2014-12-04 DIAGNOSIS — K219 Gastro-esophageal reflux disease without esophagitis: Secondary | ICD-10-CM | POA: Diagnosis not present

## 2014-12-04 DIAGNOSIS — Z9114 Patient's other noncompliance with medication regimen: Secondary | ICD-10-CM | POA: Diagnosis not present

## 2014-12-04 DIAGNOSIS — C9210 Chronic myeloid leukemia, BCR/ABL-positive, not having achieved remission: Secondary | ICD-10-CM | POA: Diagnosis not present

## 2014-12-04 DIAGNOSIS — Z9221 Personal history of antineoplastic chemotherapy: Secondary | ICD-10-CM | POA: Diagnosis not present

## 2014-12-04 DIAGNOSIS — C9291 Myeloid leukemia, unspecified in remission: Secondary | ICD-10-CM | POA: Diagnosis not present

## 2014-12-04 DIAGNOSIS — I214 Non-ST elevation (NSTEMI) myocardial infarction: Secondary | ICD-10-CM | POA: Diagnosis not present

## 2014-12-04 DIAGNOSIS — R079 Chest pain, unspecified: Secondary | ICD-10-CM | POA: Diagnosis not present

## 2014-12-04 DIAGNOSIS — I5023 Acute on chronic systolic (congestive) heart failure: Secondary | ICD-10-CM | POA: Diagnosis not present

## 2014-12-04 DIAGNOSIS — I2510 Atherosclerotic heart disease of native coronary artery without angina pectoris: Secondary | ICD-10-CM | POA: Diagnosis not present

## 2014-12-04 DIAGNOSIS — I517 Cardiomegaly: Secondary | ICD-10-CM | POA: Diagnosis not present

## 2014-12-12 DIAGNOSIS — I10 Essential (primary) hypertension: Secondary | ICD-10-CM | POA: Diagnosis not present

## 2014-12-12 DIAGNOSIS — I5020 Unspecified systolic (congestive) heart failure: Secondary | ICD-10-CM | POA: Diagnosis not present

## 2014-12-12 DIAGNOSIS — E785 Hyperlipidemia, unspecified: Secondary | ICD-10-CM | POA: Diagnosis not present

## 2014-12-12 DIAGNOSIS — N189 Chronic kidney disease, unspecified: Secondary | ICD-10-CM | POA: Diagnosis not present

## 2014-12-12 DIAGNOSIS — G894 Chronic pain syndrome: Secondary | ICD-10-CM | POA: Diagnosis not present

## 2014-12-23 DIAGNOSIS — C9290 Myeloid leukemia, unspecified, not having achieved remission: Secondary | ICD-10-CM | POA: Diagnosis not present

## 2014-12-27 DIAGNOSIS — C9290 Myeloid leukemia, unspecified, not having achieved remission: Secondary | ICD-10-CM | POA: Diagnosis not present

## 2014-12-27 DIAGNOSIS — Z87891 Personal history of nicotine dependence: Secondary | ICD-10-CM | POA: Diagnosis not present

## 2014-12-27 DIAGNOSIS — I252 Old myocardial infarction: Secondary | ICD-10-CM | POA: Diagnosis not present

## 2014-12-27 DIAGNOSIS — Z8673 Personal history of transient ischemic attack (TIA), and cerebral infarction without residual deficits: Secondary | ICD-10-CM | POA: Diagnosis not present

## 2014-12-27 DIAGNOSIS — I10 Essential (primary) hypertension: Secondary | ICD-10-CM | POA: Diagnosis not present

## 2015-01-09 DIAGNOSIS — N189 Chronic kidney disease, unspecified: Secondary | ICD-10-CM | POA: Diagnosis not present

## 2015-01-09 DIAGNOSIS — G894 Chronic pain syndrome: Secondary | ICD-10-CM | POA: Diagnosis not present

## 2015-01-09 DIAGNOSIS — I420 Dilated cardiomyopathy: Secondary | ICD-10-CM | POA: Diagnosis not present

## 2015-01-09 DIAGNOSIS — I130 Hypertensive heart and chronic kidney disease with heart failure and stage 1 through stage 4 chronic kidney disease, or unspecified chronic kidney disease: Secondary | ICD-10-CM | POA: Diagnosis not present

## 2015-01-09 DIAGNOSIS — I509 Heart failure, unspecified: Secondary | ICD-10-CM | POA: Diagnosis not present

## 2015-01-24 ENCOUNTER — Observation Stay (HOSPITAL_COMMUNITY)
Admission: EM | Admit: 2015-01-24 | Discharge: 2015-01-29 | Disposition: A | Discharge status: Home / Self Care | Payer: Medicare Other | Attending: Internal Medicine | Admitting: Internal Medicine

## 2015-01-24 ENCOUNTER — Emergency Department (HOSPITAL_COMMUNITY): Payer: Medicare Other

## 2015-01-24 ENCOUNTER — Encounter (HOSPITAL_COMMUNITY): Payer: Self-pay | Admitting: Emergency Medicine

## 2015-01-24 DIAGNOSIS — I420 Dilated cardiomyopathy: Secondary | ICD-10-CM | POA: Diagnosis not present

## 2015-01-24 DIAGNOSIS — R748 Abnormal levels of other serum enzymes: Secondary | ICD-10-CM

## 2015-01-24 DIAGNOSIS — R7989 Other specified abnormal findings of blood chemistry: Principal | ICD-10-CM | POA: Diagnosis present

## 2015-01-24 DIAGNOSIS — Z7902 Long term (current) use of antithrombotics/antiplatelets: Secondary | ICD-10-CM | POA: Insufficient documentation

## 2015-01-24 DIAGNOSIS — R05 Cough: Secondary | ICD-10-CM | POA: Diagnosis not present

## 2015-01-24 DIAGNOSIS — Z9114 Patient's other noncompliance with medication regimen: Secondary | ICD-10-CM | POA: Insufficient documentation

## 2015-01-24 DIAGNOSIS — Z9119 Patient's noncompliance with other medical treatment and regimen: Secondary | ICD-10-CM

## 2015-01-24 DIAGNOSIS — R0602 Shortness of breath: Secondary | ICD-10-CM | POA: Diagnosis not present

## 2015-01-24 DIAGNOSIS — Z79891 Long term (current) use of opiate analgesic: Secondary | ICD-10-CM | POA: Insufficient documentation

## 2015-01-24 DIAGNOSIS — Z7982 Long term (current) use of aspirin: Secondary | ICD-10-CM | POA: Diagnosis not present

## 2015-01-24 DIAGNOSIS — R778 Other specified abnormalities of plasma proteins: Secondary | ICD-10-CM | POA: Diagnosis present

## 2015-01-24 DIAGNOSIS — I5043 Acute on chronic combined systolic (congestive) and diastolic (congestive) heart failure: Secondary | ICD-10-CM | POA: Diagnosis not present

## 2015-01-24 DIAGNOSIS — E876 Hypokalemia: Secondary | ICD-10-CM | POA: Diagnosis not present

## 2015-01-24 DIAGNOSIS — E785 Hyperlipidemia, unspecified: Secondary | ICD-10-CM | POA: Insufficient documentation

## 2015-01-24 DIAGNOSIS — I2510 Atherosclerotic heart disease of native coronary artery without angina pectoris: Secondary | ICD-10-CM | POA: Diagnosis not present

## 2015-01-24 DIAGNOSIS — R109 Unspecified abdominal pain: Secondary | ICD-10-CM

## 2015-01-24 DIAGNOSIS — Z87891 Personal history of nicotine dependence: Secondary | ICD-10-CM | POA: Diagnosis not present

## 2015-01-24 DIAGNOSIS — R42 Dizziness and giddiness: Secondary | ICD-10-CM | POA: Diagnosis not present

## 2015-01-24 DIAGNOSIS — I110 Hypertensive heart disease with heart failure: Secondary | ICD-10-CM | POA: Diagnosis not present

## 2015-01-24 DIAGNOSIS — I10 Essential (primary) hypertension: Secondary | ICD-10-CM | POA: Diagnosis present

## 2015-01-24 DIAGNOSIS — Z23 Encounter for immunization: Secondary | ICD-10-CM | POA: Diagnosis not present

## 2015-01-24 DIAGNOSIS — R9439 Abnormal result of other cardiovascular function study: Secondary | ICD-10-CM | POA: Diagnosis not present

## 2015-01-24 DIAGNOSIS — Z91199 Patient's noncompliance with other medical treatment and regimen due to unspecified reason: Secondary | ICD-10-CM

## 2015-01-24 DIAGNOSIS — E7800 Pure hypercholesterolemia, unspecified: Secondary | ICD-10-CM | POA: Insufficient documentation

## 2015-01-24 DIAGNOSIS — C9210 Chronic myeloid leukemia, BCR/ABL-positive, not having achieved remission: Secondary | ICD-10-CM | POA: Diagnosis present

## 2015-01-24 DIAGNOSIS — Z8673 Personal history of transient ischemic attack (TIA), and cerebral infarction without residual deficits: Secondary | ICD-10-CM | POA: Insufficient documentation

## 2015-01-24 DIAGNOSIS — K5900 Constipation, unspecified: Secondary | ICD-10-CM | POA: Diagnosis not present

## 2015-01-24 DIAGNOSIS — J449 Chronic obstructive pulmonary disease, unspecified: Secondary | ICD-10-CM | POA: Insufficient documentation

## 2015-01-24 DIAGNOSIS — Z79899 Other long term (current) drug therapy: Secondary | ICD-10-CM | POA: Diagnosis not present

## 2015-01-24 DIAGNOSIS — G459 Transient cerebral ischemic attack, unspecified: Secondary | ICD-10-CM | POA: Diagnosis present

## 2015-01-24 MED ORDER — PREDNISONE 20 MG PO TABS
60.0000 mg | ORAL_TABLET | Freq: Once | ORAL | Status: AC
  Administered 2015-01-24: 60 mg via ORAL
  Filled 2015-01-24: qty 3

## 2015-01-24 MED ORDER — ALBUTEROL SULFATE (2.5 MG/3ML) 0.083% IN NEBU
5.0000 mg | INHALATION_SOLUTION | Freq: Once | RESPIRATORY_TRACT | Status: AC
  Administered 2015-01-24: 5 mg via RESPIRATORY_TRACT
  Filled 2015-01-24: qty 6

## 2015-01-24 NOTE — ED Notes (Signed)
Bed: GF94 Expected date:  Expected time:  Means of arrival:  Comments: EMS/71M/SOB and abd pain

## 2015-01-24 NOTE — ED Provider Notes (Signed)
CSN: [id number]     Arrival date & time 01/24/15  2234 History  By signing my name below, I, Irene Pap, attest that this documentation has been prepared under the direction and in the presence of Tanna Furry, MD. Electronically Signed: Irene Pap, ED Scribe. 01/24/2015. 12:31 AM.  Chief Complaint  Patient presents with  . Shortness of Breath   The history is provided by the patient. No language interpreter was used.  HPI Comments: Bruce [name] is a 69 y.o. male with a hx of COPD, stroke, CAD, HTN, CML, and TIA brought in by EMS who presents to the Emergency Department complaining of SOB onset one month ago, worsening today about 1 hour ago. Pt states that he came in to the ED today because he felt like he was going to faint and lives by himself. He reports associated cough. He reports that the oxygen giving to him in the ED has been giving him some relief. Pt reports a small knot in his central abdomen onset 3 days ago that tends to become more prominent when coughing. Per triage note, the pt's vitals en route with EMS were  O2 98% on RA, and 100% on nasal cannula. He denies any other symptoms.  Past Medical History  Diagnosis Date  . Coronary artery disease   . Hypertension   . Hypercholesterolemia   . Stroke ([place])     No residual limb weakness.  Walks with cane at baseline.   Marland Kitchen CML (chronic myelocytic leukemia) ([place])   . TIA (transient ischemic attack) 05/10/2014  . Bell's palsy   . Leukemia [facility])    Past Surgical History  Procedure Laterality Date  . Back surgery    . Hip arthroplasty Right     orif  . Orif forearm fracture Right    Family History  Problem Relation Age of Onset  . Diabetes Mother   . Hypertension Mother   . Diabetes Father   . Hypertension Father   . Diabetes Brother   . Hypertension Brother   . Diabetes Sister   . Hypertension Sister   . Diabetes Brother   . Hypertension Brother   . Diabetes Sister   . Hypertension Sister    Social  History  Substance Use Topics  . Smoking status: Former Smoker -- 0.50 packs/day for 50 years    Types: Cigarettes  . Smokeless tobacco: Never Used  . Alcohol Use: 0.6 oz/week    1 Cans of beer per week     Comment: daily     Review of Systems  Constitutional: Negative for chills, appetite change and fatigue.  HENT: Negative for mouth sores and trouble swallowing.   Eyes: Negative for visual disturbance.  Respiratory: Positive for cough and shortness of breath. Negative for chest tightness.   Gastrointestinal: Negative for nausea, diarrhea and abdominal distention.  Endocrine: Negative for polydipsia, polyphagia and polyuria.  Genitourinary: Negative for dysuria, frequency and hematuria.  Musculoskeletal: Negative for gait problem.  Skin: Negative for color change and pallor.  Neurological: Negative for dizziness, syncope and light-headedness.  Hematological: Does not bruise/bleed easily.  Psychiatric/Behavioral: Negative for behavioral problems and confusion.   Allergies  Review of patient's allergies indicates no known allergies.  Home Medications   Prior to Admission medications   Medication Sig Start Date End Date Taking? Authorizing Provider  albuterol (PROVENTIL HFA;VENTOLIN HFA) 108 (90 BASE) MCG/ACT inhaler Inhale 2 puffs into the lungs every 4 (four) hours as needed for wheezing or shortness of breath. 08/08/14  Yes Noemi Chapel, MD  aspirin 81 MG chewable tablet Chew 1 tablet (81 mg total) by mouth daily. 09/22/14  Yes Hosie Poisson, MD  atorvastatin (LIPITOR) 40 MG tablet Take 40 mg by mouth every morning.  03/02/14  Yes Historical Provider, MD  clopidogrel (PLAVIX) 75 MG tablet Take 1 tablet (75 mg total) by mouth daily. 03/21/14  Yes [name], MD  polyethylene glycol (MIRALAX / GLYCOLAX) packet Take 17 g by mouth daily. Patient taking differently: Take 17 g by mouth daily as needed for moderate constipation.  09/22/14  Yes Hosie Poisson, MD  calcium carbonate (TUMS -  DOSED IN MG ELEMENTAL CALCIUM) 500 MG chewable tablet Chew 2 tablets (400 mg of elemental calcium total) by mouth every 6 (six) hours as needed for indigestion or heartburn. Patient not taking: Reported on 10/17/2014 09/22/14   Hosie Poisson, MD  carvedilol (COREG) 6.25 MG tablet Take 1 tablet (6.25 mg total) by mouth 2 (two) times daily with a meal. 01/29/15   Barton Dubois, MD  digoxin (LANOXIN) 0.125 MG tablet Take 1 tablet (0.125 mg total) by mouth daily. 01/29/15   Barton Dubois, MD  furosemide (LASIX) 40 MG tablet Take 1.5 tablets (60 mg total) by mouth daily. 01/29/15   Barton Dubois, MD  isosorbide-hydrALAZINE (BIDIL) 20-37.5 MG tablet Take 0.5 tablets by mouth 2 (two) times daily. 01/29/15   Barton Dubois, MD  losartan (COZAAR) 50 MG tablet Take 1 tablet (50 mg total) by mouth daily. 01/29/15   Barton Dubois, MD  oxyCODONE-acetaminophen (PERCOCET/ROXICET) 5-325 MG tablet Take 1 tablet by mouth every 8 (eight) hours as needed for severe pain. pain 01/29/15   Barton Dubois, MD  pantoprazole (PROTONIX) 40 MG tablet Take 1 tablet (40 mg total) by mouth daily. 01/29/15   Barton Dubois, MD  potassium chloride SA (K-DUR,KLOR-CON) 20 MEQ tablet Take 1 tablet (20 mEq total) by mouth 2 (two) times daily. Patient not taking: Reported on 10/31/2014 07/31/14   Truitt Merle, MD  spironolactone (ALDACTONE) 25 MG tablet Take 1 tablet (25 mg total) by mouth daily. 01/29/15   Barton Dubois, MD   BP 125/86 mmHg  Pulse 72  Temp(Src) 97.2 F (36.2 C) (Oral)  Resp 20  Ht 5\' 8"  (1.727 m)  Wt 167 lb 8 oz (75.978 kg)  BMI 25.47 kg/m2  SpO2 98% Physical Exam  Constitutional: He is oriented to person, place, and time. He appears well-developed and well-nourished. No distress.  HENT:  Head: Normocephalic.  Eyes: Conjunctivae are normal. Pupils are equal, round, and reactive to light. No scleral icterus.  Neck: Normal range of motion. Neck supple. No thyromegaly present.  Cardiovascular: Normal rate and regular  rhythm.  Exam reveals no gallop and no friction rub.   No murmur heard. Pulmonary/Chest: Effort normal and breath sounds normal. No respiratory distress. He has no wheezes. He has no rales.  Globally diminished breath sounds, slight prolongation  Abdominal: Soft. Bowel sounds are normal. He exhibits no distension. There is no tenderness. There is no rebound.  Palpable ventral hernia, easily reduces  Musculoskeletal: Normal range of motion.  Neurological: He is alert and oriented to person, place, and time.  Skin: Skin is warm and dry. No rash noted.  Psychiatric: He has a normal mood and affect. His behavior is normal.    ED Course  Procedures (including critical care time) DIAGNOSTIC STUDIES: Oxygen Saturation is 100% on RA, normal by my interpretation.    COORDINATION OF CARE: 11:09 PM-Discussed treatment plan which includes x-ray with  pt at bedside and pt agreed to plan.   Labs Review Labs Reviewed  CBC WITH DIFFERENTIAL/PLATELET - Abnormal; Notable for the following:    RBC 3.75 (*)    Hemoglobin 12.2 (*)    HCT 38.6 (*)    MCV 102.9 (*)    RDW 17.9 (*)    Platelets 473 (*)    All other components within normal limits  BASIC METABOLIC PANEL - Abnormal; Notable for the following:    Potassium 3.4 (*)    All other components within normal limits  TROPONIN I - Abnormal; Notable for the following:    Troponin I 0.35 (*)    All other components within normal limits  COMPREHENSIVE METABOLIC PANEL - Abnormal; Notable for the following:    CO2 18 (*)    Glucose, Bld 204 (*)    AST 66 (*)    Alkaline Phosphatase 289 (*)    Total Bilirubin 3.9 (*)    All other components within normal limits  CBC WITH DIFFERENTIAL/PLATELET - Abnormal; Notable for the following:    RBC 3.63 (*)    Hemoglobin 11.8 (*)    HCT 36.9 (*)    MCV 101.7 (*)    RDW 17.7 (*)    Platelets 477 (*)    All other components within normal limits  PROTIME-INR - Abnormal; Notable for the following:     Prothrombin Time 19.4 (*)    INR 1.64 (*)    All other components within normal limits  TROPONIN I - Abnormal; Notable for the following:    Troponin I 0.36 (*)    All other components within normal limits  TROPONIN I - Abnormal; Notable for the following:    Troponin I 0.42 (*)    All other components within normal limits  TROPONIN I - Abnormal; Notable for the following:    Troponin I 0.37 (*)    All other components within normal limits  BRAIN NATRIURETIC PEPTIDE - Abnormal; Notable for the following:    B Natriuretic Peptide 2425.8 (*)    All other components within normal limits  BRAIN NATRIURETIC PEPTIDE - Abnormal; Notable for the following:    B Natriuretic Peptide 1206.9 (*)    All other components within normal limits  BASIC METABOLIC PANEL - Abnormal; Notable for the following:    Glucose, Bld 119 (*)    BUN 24 (*)    GFR calc non Af Amer 58 (*)    All other components within normal limits  BASIC METABOLIC PANEL - Abnormal; Notable for the following:    Glucose, Bld 117 (*)    BUN 23 (*)    Calcium 8.8 (*)    All other components within normal limits  BASIC METABOLIC PANEL - Abnormal; Notable for the following:    Glucose, Bld 126 (*)    GFR calc non Af Amer 58 (*)    All other components within normal limits  TSH  MAGNESIUM  MAGNESIUM  PHOSPHORUS    Imaging Review No results found. I have personally reviewed and evaluated these images and lab results as part of my medical decision-making.   EKG Interpretation   Date/Time:  Friday January 24 2015 22:45:03 EDT Ventricular Rate:  112 PR Interval:  94 QRS Duration: 94 QT Interval:  382 QTC Calculation: 521 R Axis:   -29 Text Interpretation:  Sinus tachycardia Ventricular trigeminy Borderline  left axis deviation Nonspecific T abnormalities, lateral leads Prolonged  QT interval ED PHYSICIAN INTERPRETATION AVAILABLE IN [facility]  Confirmed by TEST, Record ([id number]) on 01/25/2015 9:51:10 AM      MDM    Final diagnoses:  Dizziness  Cardiac enzymes elevated   Patient with sinus tachycardia on EKG.  lungs clear after nebulized [place]. Clinically and radiographically does not appear to be in congestive heart failure. The patient was ambulated a short distance and complains of being dizzy.  Patient has slight elevation of troponin of 0.35. On her previous admission with shock patient had elevation of troponins throughout his stay. Per cardiology was thought perhaps to be due to to "demand ischemia". Patient is not complaining of pain. Heart rate has improved after a period of time following his albuterol.   I personally performed the services described in this documentation, which was scribed in my presence. The recorded information has been reviewed and is accurate.   I personally performed the services described in this documentation, which was scribed in my presence. The recorded information has been reviewed and is accurate.    Tanna Furry, MD 02/07/15 [phone]

## 2015-01-24 NOTE — ED Notes (Signed)
Pt presents to ED from home via EMS c/o SOB x 1 hour and a "weird lump" on his abdomen since Thursday. Pt has hx of COPD but does not use home oxygen.  EMS reports vitals WNL en route.  O2 98% on room air, 100% on 2L nasal cannula.  Pt reports that SOB feels improved with nasal cannula.

## 2015-01-25 ENCOUNTER — Emergency Department (HOSPITAL_COMMUNITY): Payer: Medicare Other

## 2015-01-25 ENCOUNTER — Observation Stay (HOSPITAL_COMMUNITY): Payer: Medicare Other

## 2015-01-25 DIAGNOSIS — I110 Hypertensive heart disease with heart failure: Secondary | ICD-10-CM | POA: Diagnosis not present

## 2015-01-25 DIAGNOSIS — I5043 Acute on chronic combined systolic (congestive) and diastolic (congestive) heart failure: Secondary | ICD-10-CM | POA: Diagnosis present

## 2015-01-25 DIAGNOSIS — R42 Dizziness and giddiness: Secondary | ICD-10-CM | POA: Insufficient documentation

## 2015-01-25 DIAGNOSIS — Z9119 Patient's noncompliance with other medical treatment and regimen: Secondary | ICD-10-CM | POA: Diagnosis not present

## 2015-01-25 DIAGNOSIS — I10 Essential (primary) hypertension: Secondary | ICD-10-CM | POA: Diagnosis present

## 2015-01-25 DIAGNOSIS — I2510 Atherosclerotic heart disease of native coronary artery without angina pectoris: Secondary | ICD-10-CM | POA: Diagnosis not present

## 2015-01-25 DIAGNOSIS — R0602 Shortness of breath: Secondary | ICD-10-CM | POA: Diagnosis not present

## 2015-01-25 DIAGNOSIS — R05 Cough: Secondary | ICD-10-CM | POA: Diagnosis not present

## 2015-01-25 DIAGNOSIS — R7989 Other specified abnormal findings of blood chemistry: Secondary | ICD-10-CM | POA: Diagnosis not present

## 2015-01-25 DIAGNOSIS — I639 Cerebral infarction, unspecified: Secondary | ICD-10-CM | POA: Diagnosis not present

## 2015-01-25 DIAGNOSIS — Z91199 Patient's noncompliance with other medical treatment and regimen due to unspecified reason: Secondary | ICD-10-CM

## 2015-01-25 DIAGNOSIS — I5023 Acute on chronic systolic (congestive) heart failure: Secondary | ICD-10-CM | POA: Diagnosis not present

## 2015-01-25 LAB — BASIC METABOLIC PANEL
ANION GAP: 13 (ref 5–15)
BUN: 13 mg/dL (ref 6–20)
CO2: 22 mmol/L (ref 22–32)
Calcium: 9.2 mg/dL (ref 8.9–10.3)
Chloride: 105 mmol/L (ref 101–111)
Creatinine, Ser: 1.11 mg/dL (ref 0.61–1.24)
GFR calc Af Amer: >60 mL/min (ref >60)
Glucose, Bld: 97 mg/dL (ref 65–99)
POTASSIUM: 3.4 mmol/L — AB (ref 3.5–5.1)
SODIUM: 140 mmol/L (ref 135–145)

## 2015-01-25 LAB — CBC WITH DIFFERENTIAL/PLATELET
BASOS ABS: 0.1 10*3/uL (ref 0.0–0.1)
BASOS PCT: 2 %
Basophils Absolute: 0.1 10*3/uL (ref 0.0–0.1)
Basophils Relative: 1 %
EOS ABS: 0 10*3/uL (ref 0.0–0.7)
EOS ABS: 0.1 10*3/uL (ref 0.0–0.7)
EOS PCT: 0 %
EOS PCT: 1 %
HCT: 38.6 % — ABNORMAL LOW (ref 39.0–52.0)
HEMATOCRIT: 36.9 % — AB (ref 39.0–52.0)
HEMOGLOBIN: 12.2 g/dL — AB (ref 13.0–17.0)
Hemoglobin: 11.8 g/dL — ABNORMAL LOW (ref 13.0–17.0)
Lymphocytes Relative: 10 %
Lymphocytes Relative: 31 %
Lymphs Abs: 0.7 10*3/uL (ref 0.7–4.0)
Lymphs Abs: 2 10*3/uL (ref 0.7–4.0)
MCH: 32.5 pg (ref 26.0–34.0)
MCH: 32.5 pg (ref 26.0–34.0)
MCHC: 32 g/dL (ref 30.0–36.0)
MCHC: 31.6 g/dL (ref 30.0–36.0)
MCV: 101.7 fL — ABNORMAL HIGH (ref 78.0–100.0)
MCV: 102.9 fL — AB (ref 78.0–100.0)
MONO ABS: 0.1 10*3/uL (ref 0.1–1.0)
Monocytes Absolute: 0.6 10*3/uL (ref 0.1–1.0)
Monocytes Relative: 10 %
Monocytes Relative: 2 %
NEUTROS ABS: 5.9 10*3/uL (ref 1.7–7.7)
NEUTROS ABS: 3.5 10*3/uL (ref 1.7–7.7)
Neutrophils Relative %: 56 %
Neutrophils Relative %: 87 %
PLATELETS: 477 10*3/uL — AB (ref 150–400)
Platelets: 473 10*3/uL — ABNORMAL HIGH (ref 150–400)
RBC: 3.63 MIL/uL — ABNORMAL LOW (ref 4.22–5.81)
RBC: 3.75 MIL/uL — ABNORMAL LOW (ref 4.22–5.81)
RDW: 17.7 % — AB (ref 11.5–15.5)
RDW: 17.9 % — ABNORMAL HIGH (ref 11.5–15.5)
WBC: 6.3 10*3/uL (ref 4.0–10.5)
WBC: 6.8 10*3/uL (ref 4.0–10.5)

## 2015-01-25 LAB — COMPREHENSIVE METABOLIC PANEL
ALBUMIN: 3.8 g/dL (ref 3.5–5.0)
ALK PHOS: 289 U/L — AB (ref 38–126)
ALT: 33 U/L (ref 17–63)
ANION GAP: 14 (ref 5–15)
AST: 66 U/L — ABNORMAL HIGH (ref 15–41)
BILIRUBIN TOTAL: 3.9 mg/dL — AB (ref 0.3–1.2)
BUN: 14 mg/dL (ref 6–20)
CALCIUM: 8.9 mg/dL (ref 8.9–10.3)
CO2: 18 mmol/L — ABNORMAL LOW (ref 22–32)
CREATININE: 1.07 mg/dL (ref 0.61–1.24)
Chloride: 104 mmol/L (ref 101–111)
GFR calc Af Amer: >60 mL/min (ref >60)
GFR calc non Af Amer: >60 mL/min (ref >60)
GLUCOSE: 204 mg/dL — AB (ref 65–99)
Potassium: 4.1 mmol/L (ref 3.5–5.1)
Sodium: 136 mmol/L (ref 135–145)
TOTAL PROTEIN: 7.8 g/dL (ref 6.5–8.1)

## 2015-01-25 LAB — TROPONIN I
Troponin I: 0.35 ng/mL — ABNORMAL HIGH (ref <0.031)
Troponin I: 0.36 ng/mL — ABNORMAL HIGH (ref <0.031)
Troponin I: 0.37 ng/mL — ABNORMAL HIGH (ref <0.031)
Troponin I: 0.42 ng/mL — ABNORMAL HIGH (ref <0.031)

## 2015-01-25 LAB — PROTIME-INR
INR: 1.64 — AB (ref 0.00–1.49)
PROTHROMBIN TIME: 19.4 s — AB (ref 11.6–15.2)

## 2015-01-25 LAB — BRAIN NATRIURETIC PEPTIDE: B Natriuretic Peptide: 2425.8 pg/mL — ABNORMAL HIGH (ref 0.0–100.0)

## 2015-01-25 LAB — TSH: TSH: 0.549 u[IU]/mL (ref 0.350–4.500)

## 2015-01-25 MED ORDER — ASPIRIN 81 MG PO CHEW
81.0000 mg | CHEWABLE_TABLET | Freq: Every day | ORAL | Status: DC
  Administered 2015-01-25 – 2015-01-29 (×5): 81 mg via ORAL
  Filled 2015-01-25 (×5): qty 1

## 2015-01-25 MED ORDER — CARVEDILOL 6.25 MG PO TABS
6.2500 mg | ORAL_TABLET | Freq: Every day | ORAL | Status: DC
  Administered 2015-01-25: 6.25 mg via ORAL
  Filled 2015-01-25: qty 1

## 2015-01-25 MED ORDER — INFLUENZA VAC SPLIT QUAD 0.5 ML IM SUSY
0.5000 mL | PREFILLED_SYRINGE | INTRAMUSCULAR | Status: AC
  Administered 2015-01-26: 0.5 mL via INTRAMUSCULAR
  Filled 2015-01-25 (×2): qty 0.5

## 2015-01-25 MED ORDER — SODIUM CHLORIDE 0.9 % IV SOLN: 250.0000 mL | INTRAVENOUS | Status: DC | PRN

## 2015-01-25 MED ORDER — SODIUM CHLORIDE 0.9 % IJ SOLN
3.0000 mL | Freq: Two times a day (BID) | INTRAMUSCULAR | Status: DC
  Administered 2015-01-25 – 2015-01-28 (×8): 3 mL via INTRAVENOUS

## 2015-01-25 MED ORDER — CLOPIDOGREL BISULFATE 75 MG PO TABS
75.0000 mg | ORAL_TABLET | Freq: Every day | ORAL | Status: DC
  Administered 2015-01-25 – 2015-01-29 (×5): 75 mg via ORAL
  Filled 2015-01-25 (×5): qty 1

## 2015-01-25 MED ORDER — ATORVASTATIN CALCIUM 40 MG PO TABS
40.0000 mg | ORAL_TABLET | Freq: Every day | ORAL | Status: DC
  Administered 2015-01-25 – 2015-01-28 (×4): 40 mg via ORAL
  Filled 2015-01-25 (×5): qty 1

## 2015-01-25 MED ORDER — SODIUM CHLORIDE 0.9 % IJ SOLN: 3.0000 mL | INTRAMUSCULAR | Status: DC | PRN

## 2015-01-25 MED ORDER — POLYETHYLENE GLYCOL 3350 17 G PO PACK
17.0000 g | PACK | Freq: Every day | ORAL | Status: DC | PRN
  Administered 2015-01-27: 17 g via ORAL
  Filled 2015-01-25: qty 1

## 2015-01-25 MED ORDER — ONDANSETRON HCL 4 MG/2ML IJ SOLN: 4.0000 mg | Freq: Four times a day (QID) | INTRAMUSCULAR | Status: DC | PRN

## 2015-01-25 MED ORDER — LOSARTAN POTASSIUM 50 MG PO TABS
50.0000 mg | ORAL_TABLET | Freq: Every day | ORAL | Status: DC
  Administered 2015-01-25 – 2015-01-29 (×5): 50 mg via ORAL
  Filled 2015-01-25 (×5): qty 1

## 2015-01-25 MED ORDER — ENOXAPARIN SODIUM 40 MG/0.4ML ~~LOC~~ SOLN
40.0000 mg | SUBCUTANEOUS | Status: DC
  Administered 2015-01-25 – 2015-01-28 (×4): 40 mg via SUBCUTANEOUS
  Filled 2015-01-25 (×5): qty 0.4

## 2015-01-25 MED ORDER — CARVEDILOL 6.25 MG PO TABS
6.2500 mg | ORAL_TABLET | Freq: Two times a day (BID) | ORAL | Status: DC
  Administered 2015-01-25 – 2015-01-29 (×8): 6.25 mg via ORAL
  Filled 2015-01-25 (×8): qty 1

## 2015-01-25 MED ORDER — ISOSORB DINITRATE-HYDRALAZINE 20-37.5 MG PO TABS
0.5000 | ORAL_TABLET | Freq: Three times a day (TID) | ORAL | Status: DC
  Administered 2015-01-25: 0.5 via ORAL
  Filled 2015-01-25 (×2): qty 0.5

## 2015-01-25 MED ORDER — ACETAMINOPHEN 325 MG PO TABS
650.0000 mg | ORAL_TABLET | ORAL | Status: DC | PRN
  Administered 2015-01-25 (×2): 650 mg via ORAL
  Filled 2015-01-25 (×2): qty 2

## 2015-01-25 MED ORDER — LISINOPRIL 5 MG PO TABS
5.0000 mg | ORAL_TABLET | Freq: Every day | ORAL | Status: DC
Start: 2015-01-25 — End: 2015-01-25
  Administered 2015-01-25: 5 mg via ORAL
  Filled 2015-01-25: qty 1

## 2015-01-25 MED ORDER — SPIRONOLACTONE 25 MG PO TABS
25.0000 mg | ORAL_TABLET | Freq: Every day | ORAL | Status: DC
  Administered 2015-01-25 – 2015-01-29 (×5): 25 mg via ORAL
  Filled 2015-01-25 (×5): qty 1

## 2015-01-25 MED ORDER — FUROSEMIDE 10 MG/ML IJ SOLN
40.0000 mg | Freq: Once | INTRAMUSCULAR | Status: AC
  Administered 2015-01-25: 40 mg via INTRAVENOUS
  Filled 2015-01-25: qty 4

## 2015-01-25 NOTE — H&P (Signed)
[facility] History and Physical  Patient: Bruce [name]  MRN: [id number]  DOB: [birth year]  DOS: the patient was seen and examined on 01/25/2015 PCP: [name], MD  Referring physician: Dr. Jeneen Rinks Chief Complaint: Shortness of breath  HPI: Bruce [name] is a 69 y.o. male with Past medical history of coronary artery disease, hypertension, CVA, CML, chronic systolic CHF. Patient presents with complaints of progressively worsening shortness of breath ongoing day. Patient complains of cough with expectoration. He complains of pain in his abdomen cause of the cough. He denies any fever or chills no chest pain. He denies any diarrhea or constipation. He complained of orthopnea. He mentions that he has not been taking any of his prescription medication since last few weeks other than aspirin and Plavix. He denies any drug abuse or alcohol abuse.  The patient is coming from home  At his baseline ambulates without support And is independent for most of his ADL; manages his medication on his own.  Review of Systems: as mentioned in the history of present illness.  A comprehensive review of the other systems is negative.  Past Medical History  Diagnosis Date  . Coronary artery disease   . Hypertension   . Hypercholesterolemia   . Stroke ([place])     No residual limb weakness.  Walks with cane at baseline.   Marland Kitchen CML (chronic myelocytic leukemia) ([place])   . TIA (transient ischemic attack) 05/10/2014  . Bell's palsy   . Leukemia [facility])    Past Surgical History  Procedure Laterality Date  . Back surgery    . Hip arthroplasty Right     orif  . Orif forearm fracture Right    Social History:  reports that he has quit smoking. His smoking use included Cigarettes. He has a 25 pack-year smoking history. He has never used smokeless tobacco. He reports that he drinks about 0.6 oz of alcohol per week. He reports that he does not use illicit drugs.  No Known Allergies  Family  History  Problem Relation Age of Onset  . Diabetes Mother   . Hypertension Mother   . Diabetes Father   . Hypertension Father   . Diabetes Brother   . Hypertension Brother   . Diabetes Sister   . Hypertension Sister   . Diabetes Brother   . Hypertension Brother   . Diabetes Sister   . Hypertension Sister     Prior to Admission medications   Medication Sig Start Date End Date Taking? Authorizing Provider  albuterol (PROVENTIL HFA;VENTOLIN HFA) 108 (90 BASE) MCG/ACT inhaler Inhale 2 puffs into the lungs every 4 (four) hours as needed for wheezing or shortness of breath. 08/08/14  Yes Noemi Chapel, MD  aspirin 81 MG chewable tablet Chew 1 tablet (81 mg total) by mouth daily. 09/22/14  Yes Hosie Poisson, MD  atorvastatin (LIPITOR) 40 MG tablet Take 40 mg by mouth every morning.  03/02/14  Yes Historical Provider, MD  carvedilol (COREG) 6.25 MG tablet Take 6.25 mg by mouth daily.   Yes Historical Provider, MD  clopidogrel (PLAVIX) 75 MG tablet Take 1 tablet (75 mg total) by mouth daily. 03/21/14  Yes [name], MD  diphenhydramine-acetaminophen (TYLENOL PM) 25-500 MG TABS Take 2 tablets by mouth daily as needed (pain/sleep).   Yes Historical Provider, MD  furosemide (LASIX) 20 MG tablet Take 20 mg by mouth daily as needed for fluid.    Yes Historical Provider, MD  furosemide (LASIX) 80 MG tablet Take 1 tablet (80  mg total) by mouth 2 (two) times daily. Patient taking differently: Take 80 mg by mouth 2 (two) times daily as needed for fluid.  09/22/14  Yes Hosie Poisson, MD  oxyCODONE-acetaminophen (PERCOCET/ROXICET) 5-325 MG per tablet Take 1 tablet by mouth every 8 (eight) hours as needed. pain 09/30/14  Yes Historical Provider, MD  polyethylene glycol (MIRALAX / GLYCOLAX) packet Take 17 g by mouth daily. Patient taking differently: Take 17 g by mouth daily as needed for moderate constipation.  09/22/14  Yes Hosie Poisson, MD  benzonatate (TESSALON) 100 MG capsule Take 1 capsule (100 mg total)  by mouth every 8 (eight) hours. Patient taking differently: Take 100 mg by mouth every 8 (eight) hours as needed for cough.  08/08/14   Noemi Chapel, MD  calcium carbonate (TUMS - DOSED IN MG ELEMENTAL CALCIUM) 500 MG chewable tablet Chew 2 tablets (400 mg of elemental calcium total) by mouth every 6 (six) hours as needed for indigestion or heartburn. Patient not taking: Reported on 10/17/2014 09/22/14   Hosie Poisson, MD  isosorbide-hydrALAZINE (BIDIL) 20-37.5 MG per tablet Take 0.5 tablets by mouth 3 (three) times daily. Patient not taking: Reported on 10/17/2014 09/22/14   Hosie Poisson, MD  pantoprazole (PROTONIX) 40 MG tablet Take 1 tablet (40 mg total) by mouth daily. Patient not taking: Reported on 10/17/2014 09/22/14   Hosie Poisson, MD  potassium chloride SA (K-DUR,KLOR-CON) 20 MEQ tablet Take 1 tablet (20 mEq total) by mouth 2 (two) times daily. Patient not taking: Reported on 10/31/2014 07/31/14   Truitt Merle, MD    Physical Exam: Filed Vitals:   01/25/15 0230 01/25/15 0330 01/25/15 0418 01/25/15 0427  BP: 145/98 148/95  145/93  Pulse: 110 106  107  Temp:    98.6 F (37 C)  TempSrc:    Oral  Resp: 20 22  22   Height:   5\' 8"  (1.727 m)   Weight:   76.5 kg (168 lb 10.4 oz)   SpO2: 100% 100%  100%    General: Alert, Awake and Oriented to Time, Place and Person. Appear in mild distress Eyes: PERRL ENT: Oral Mucosa clear moist. Neck: mild JVD Cardiovascular: S1 and S2 Present, aortic systolic  Murmur, Peripheral Pulses Present Respiratory: Bilateral Air entry equal and Decreased,  Basal  Crackles, no wheezes Abdomen: Bowel Sound present, Soft and no tenderness Skin: no Rash Extremities: Trace  Pedal edema, no calf tenderness Neurologic: Grossly no focal neuro deficit.  Labs on Admission:  CBC:  Recent Labs Lab 01/24/15 2347 01/25/15 0340  WBC 6.3 6.8  NEUTROABS 3.5 5.9  HGB 12.2* 11.8*  HCT 38.6* 36.9*  MCV 102.9* 101.7*  PLT 473* 477*    CMP     Component Value Date/Time    NA 136 01/25/2015 0340   NA 142 07/31/2014 1244   K 4.1 01/25/2015 0340   K 4.3 07/31/2014 1244   CL 104 01/25/2015 0340   CO2 18* 01/25/2015 0340   CO2 22 07/31/2014 1244   GLUCOSE 204* 01/25/2015 0340   GLUCOSE 133 07/31/2014 1244   BUN 14 01/25/2015 0340   BUN 18.3 07/31/2014 1244   CREATININE 1.07 01/25/2015 0340   CREATININE 1.2 07/31/2014 1244   CALCIUM 8.9 01/25/2015 0340   CALCIUM 9.2 07/31/2014 1244   PROT 7.8 01/25/2015 0340   PROT 6.9 07/31/2014 1244   ALBUMIN 3.8 01/25/2015 0340   ALBUMIN 3.6 07/31/2014 1244   AST 66* 01/25/2015 0340   AST 64* 07/31/2014 1244   ALT 33 01/25/2015  0340   ALT 87* 07/31/2014 1244   ALKPHOS 289* 01/25/2015 0340   ALKPHOS 141 07/31/2014 1244   BILITOT 3.9* 01/25/2015 0340   BILITOT 0.42 07/31/2014 1244   GFRNONAA >60 01/25/2015 0340   GFRAA >60 01/25/2015 0340     Recent Labs Lab 01/24/15 2347 01/25/15 0340  TROPONINI 0.35* 0.36*   BNP (last 3 results)  Recent Labs  09/15/14 0406 09/20/14 2047  BNP 1838.9* 2397.1*    ProBNP (last 3 results)  Recent Labs  03/05/14 1515  PROBNP 376.5*     Radiological Exams on Admission: Dg Chest 2 View  01/25/2015  CLINICAL DATA:  Shortness of breath, dizziness, cough, and congestion for 1-2 weeks. Former smoker. History of leukemia. EXAM: CHEST  2 VIEW COMPARISON:  09/18/2014 FINDINGS: Mild cardiac enlargement with normal pulmonary vascularity. No focal airspace disease or consolidation in the lungs. No blunting of costophrenic angles. No pneumothorax. Vague nodular opacities over the mid lungs likely representing prominent nipple shadows. Mediastinal contours appear intact. Degenerative changes in the spine and shoulders. IMPRESSION: No active cardiopulmonary disease. Electronically Signed   By: Lucienne Capers M.D.   On: 01/25/2015 00:21   EKG: Independently reviewed. normal sinus rhythm, nonspecific ST and T waves changes.  Assessment/Plan 1. Elevated troponin Patient  presents with complains of progressively worsening shortness of breath. Chest x-ray shows vascular congestion. Patient has mild elevation of serum troponin. At present he does not have any chest pain and just does not appear to be any ACS. We will monitor serial troponin. Recheck echocardiogram in the morning. His most likely represent acute on chronic CHF secondary to noncompliance with medical regimen. Patient remains nothing by mouth except medication.  2. Acute on chronic combined systolic and diastolic CHF (congestive heart failure) ([place])  recheck BNP. Echocardiogram in the morning.  we give him IV Lasix.  3  CML (chronic myelocytic leukemia) (HCC)  blood counts currently stable continue close monitoring.  4  TIA (transient ischemic attack)  continue aspirin and Plavix.  5    Essential hypertension   Coronary artery disease   Compliance poor  resuming antihypertensive medications at present.  adding lisinopril.  Nutrition:   Nothing by mouth except medication DVT Prophylaxis: subcutaneous Heparin  Advance goals of care discussion:  Full code   Disposition: Admitted as observation, telemetry unit.  Author: Berle Mull, MD [facility] Pager: [phone] 01/25/2015  If 7PM-7AM, please contact night-coverage [email or web address] Password [email or web address]

## 2015-01-25 NOTE — Progress Notes (Signed)
69 year old male with h/o NICM, CAD, hypertension, CML, hyperlipidemia, tobacco abuse, ADMITTED FOR sob and acute onchronic systolic heart failure. He was admitted earlier this am. Please see Dr Serita Grit note in  Detail.  Cardiology consulted and recommendations given.   Continue to monitor.   Hosie Poisson, MD [phone]

## 2015-01-25 NOTE — Consult Note (Signed)
Reason for Consult: Decompensated congestive heart failure/mildly elevated troponin I Referring Physician: [facility]  Bruce [name] is an 69 y.o. male.  HPI: Patient is 69 year old male with past medical history significant for nonischemic dilated cardiomyopathy last EF by 2-D echo 10-15%, mild coronary artery disease, hypertension, chronic myeloid leukemia, hyperlipidemia, history of CVA 2, history of focal abuse, tobacco abuse, anemia of chronic disease, was admitted earlier this morning because of progressive increasing shortness of breath associated with abdominal and leg swelling for last 1 week. Patient denies any chest pain nausea vomiting diaphoresis. States has missed his headache medicines for last few days. Patient does give history of PND orthopnea. Denies any fever or chills but complains of dry hacking cough. Denies any syncopal episode. Cardiologic consultation is called as patient was noted to be in mild CHF with minimally elevated troponin I. Patient had cardiac catheterization in the past which showed mild CAD.  Past Medical History  Diagnosis Date  . Coronary artery disease   . Hypertension   . Hypercholesterolemia   . Stroke ([place])     No residual limb weakness.  Walks with cane at baseline.   Marland Kitchen CML (chronic myelocytic leukemia) ([place])   . TIA (transient ischemic attack) 05/10/2014  . Bell's palsy   . Leukemia [facility])     Past Surgical History  Procedure Laterality Date  . Back surgery    . Hip arthroplasty Right     orif  . Orif forearm fracture Right     Family History  Problem Relation Age of Onset  . Diabetes Mother   . Hypertension Mother   . Diabetes Father   . Hypertension Father   . Diabetes Brother   . Hypertension Brother   . Diabetes Sister   . Hypertension Sister   . Diabetes Brother   . Hypertension Brother   . Diabetes Sister   . Hypertension Sister     Social History:  reports that he has quit smoking. His smoking use included  Cigarettes. He has a 25 pack-year smoking history. He has never used smokeless tobacco. He reports that he drinks about 0.6 oz of alcohol per week. He reports that he does not use illicit drugs.  Allergies: No Known Allergies  Medications: I have reviewed the patient's current medications.  Results for orders placed or performed during the hospital encounter of 01/24/15 (from the past 48 hour(s))  CBC with Differential/Platelet     Status: Abnormal   Collection Time: 01/24/15 11:47 PM  Result Value Ref Range   WBC 6.3 4.0 - 10.5 K/uL   RBC 3.75 (L) 4.22 - 5.81 MIL/uL   Hemoglobin 12.2 (L) 13.0 - 17.0 g/dL   HCT 38.6 (L) 39.0 - 52.0 %   MCV 102.9 (H) 78.0 - 100.0 fL   MCH 32.5 26.0 - 34.0 pg   MCHC 31.6 30.0 - 36.0 g/dL   RDW 17.9 (H) 11.5 - 15.5 %   Platelets 473 (H) 150 - 400 K/uL   Neutrophils Relative % 56 %   Lymphocytes Relative 31 %   Monocytes Relative 10 %   Eosinophils Relative 1 %   Basophils Relative 2 %   Neutro Abs 3.5 1.7 - 7.7 K/uL   Lymphs Abs 2.0 0.7 - 4.0 K/uL   Monocytes Absolute 0.6 0.1 - 1.0 K/uL   Eosinophils Absolute 0.1 0.0 - 0.7 K/uL   Basophils Absolute 0.1 0.0 - 0.1 K/uL   RBC Morphology BURR CELLS   Basic metabolic panel  Status: Abnormal   Collection Time: 01/24/15 11:47 PM  Result Value Ref Range   Sodium 140 135 - 145 mmol/L   Potassium 3.4 (L) 3.5 - 5.1 mmol/L   Chloride 105 101 - 111 mmol/L   CO2 22 22 - 32 mmol/L   Glucose, Bld 97 65 - 99 mg/dL   BUN 13 6 - 20 mg/dL   Creatinine, Ser 1.11 0.61 - 1.24 mg/dL   Calcium 9.2 8.9 - 10.3 mg/dL   GFR calc non Af Amer >60 >60 mL/min   GFR calc Af Amer >60 >60 mL/min    Comment: (NOTE) The eGFR has been calculated using the CKD EPI equation. This calculation has not been validated in all clinical situations. eGFR's persistently <60 mL/min signify possible Chronic Kidney Disease.    Anion gap 13 5 - 15  Troponin I     Status: Abnormal   Collection Time: 01/24/15 11:47 PM  Result Value Ref  Range   Troponin I 0.35 (H) <0.031 ng/mL    Comment:        PERSISTENTLY INCREASED TROPONIN VALUES IN THE RANGE OF 0.04-0.49 ng/mL CAN BE SEEN IN:       -UNSTABLE ANGINA       -CONGESTIVE HEART FAILURE       -MYOCARDITIS       -CHEST TRAUMA       -ARRYHTHMIAS       -LATE PRESENTING MYOCARDIAL INFARCTION       -COPD   CLINICAL FOLLOW-UP RECOMMENDED.   Comprehensive metabolic panel     Status: Abnormal   Collection Time: 01/25/15  3:40 AM  Result Value Ref Range   Sodium 136 135 - 145 mmol/L   Potassium 4.1 3.5 - 5.1 mmol/L    Comment: DELTA CHECK NOTED REPEATED TO VERIFY NO VISIBLE HEMOLYSIS    Chloride 104 101 - 111 mmol/L   CO2 18 (L) 22 - 32 mmol/L   Glucose, Bld 204 (H) 65 - 99 mg/dL   BUN 14 6 - 20 mg/dL   Creatinine, Ser 1.07 0.61 - 1.24 mg/dL   Calcium 8.9 8.9 - 10.3 mg/dL   Total Protein 7.8 6.5 - 8.1 g/dL   Albumin 3.8 3.5 - 5.0 g/dL   AST 66 (H) 15 - 41 U/L   ALT 33 17 - 63 U/L   Alkaline Phosphatase 289 (H) 38 - 126 U/L   Total Bilirubin 3.9 (H) 0.3 - 1.2 mg/dL   GFR calc non Af Amer >60 >60 mL/min   GFR calc Af Amer >60 >60 mL/min    Comment: (NOTE) The eGFR has been calculated using the CKD EPI equation. This calculation has not been validated in all clinical situations. eGFR's persistently <60 mL/min signify possible Chronic Kidney Disease.    Anion gap 14 5 - 15  CBC WITH DIFFERENTIAL     Status: Abnormal   Collection Time: 01/25/15  3:40 AM  Result Value Ref Range   WBC 6.8 4.0 - 10.5 K/uL   RBC 3.63 (L) 4.22 - 5.81 MIL/uL   Hemoglobin 11.8 (L) 13.0 - 17.0 g/dL   HCT 36.9 (L) 39.0 - 52.0 %   MCV 101.7 (H) 78.0 - 100.0 fL   MCH 32.5 26.0 - 34.0 pg   MCHC 32.0 30.0 - 36.0 g/dL   RDW 17.7 (H) 11.5 - 15.5 %   Platelets 477 (H) 150 - 400 K/uL   Neutrophils Relative % 87 %   Neutro Abs 5.9 1.7 - 7.7 K/uL  Lymphocytes Relative 10 %   Lymphs Abs 0.7 0.7 - 4.0 K/uL   Monocytes Relative 2 %   Monocytes Absolute 0.1 0.1 - 1.0 K/uL   Eosinophils  Relative 0 %   Eosinophils Absolute 0.0 0.0 - 0.7 K/uL   Basophils Relative 1 %   Basophils Absolute 0.1 0.0 - 0.1 K/uL  Protime-INR     Status: Abnormal   Collection Time: 01/25/15  3:40 AM  Result Value Ref Range   Prothrombin Time 19.4 (H) 11.6 - 15.2 seconds   INR 1.64 (H) 0.00 - 1.49  Troponin I     Status: Abnormal   Collection Time: 01/25/15  3:40 AM  Result Value Ref Range   Troponin I 0.36 (H) <0.031 ng/mL    Comment:        PERSISTENTLY INCREASED TROPONIN VALUES IN THE RANGE OF 0.04-0.49 ng/mL CAN BE SEEN IN:       -UNSTABLE ANGINA       -CONGESTIVE HEART FAILURE       -MYOCARDITIS       -CHEST TRAUMA       -ARRYHTHMIAS       -LATE PRESENTING MYOCARDIAL INFARCTION       -COPD   CLINICAL FOLLOW-UP RECOMMENDED.   Brain natriuretic peptide     Status: Abnormal   Collection Time: 01/25/15  3:40 AM  Result Value Ref Range   B Natriuretic Peptide 2425.8 (H) 0.0 - 100.0 pg/mL  TSH     Status: None   Collection Time: 01/25/15  3:41 AM  Result Value Ref Range   TSH 0.549 0.350 - 4.500 uIU/mL  Troponin I     Status: Abnormal   Collection Time: 01/25/15  9:36 AM  Result Value Ref Range   Troponin I 0.42 (H) <0.031 ng/mL    Comment:        PERSISTENTLY INCREASED TROPONIN VALUES IN THE RANGE OF 0.04-0.49 ng/mL CAN BE SEEN IN:       -UNSTABLE ANGINA       -CONGESTIVE HEART FAILURE       -MYOCARDITIS       -CHEST TRAUMA       -ARRYHTHMIAS       -LATE PRESENTING MYOCARDIAL INFARCTION       -COPD   CLINICAL FOLLOW-UP RECOMMENDED.     Dg Chest 2 View  01/25/2015  CLINICAL DATA:  Shortness of breath, dizziness, cough, and congestion for 1-2 weeks. Former smoker. History of leukemia. EXAM: CHEST  2 VIEW COMPARISON:  09/18/2014 FINDINGS: Mild cardiac enlargement with normal pulmonary vascularity. No focal airspace disease or consolidation in the lungs. No blunting of costophrenic angles. No pneumothorax. Vague nodular opacities over the mid lungs likely representing  prominent nipple shadows. Mediastinal contours appear intact. Degenerative changes in the spine and shoulders. IMPRESSION: No active cardiopulmonary disease. Electronically Signed   By: Lucienne Capers M.D.   On: 01/25/2015 00:21    Review of Systems  Constitutional: Negative for fever and chills.  Eyes: Negative for double vision.  Respiratory: Positive for cough and shortness of breath. Negative for sputum production.   Cardiovascular: Positive for orthopnea, leg swelling and PND. Negative for chest pain and palpitations.  Gastrointestinal: Negative for nausea and vomiting.  Genitourinary: Negative for dysuria.  Neurological: Positive for weakness. Negative for dizziness and headaches.   Blood pressure 145/93, pulse 107, temperature 98.6 F (37 C), temperature source Oral, resp. rate 22, height _0  (1.727 m), weight 76.5 kg (168 lb 10.4 oz), SpO2 100 %.  Physical Exam  Constitutional: He is oriented to person, place, and time.  HENT:  Head: Normocephalic and atraumatic.  Eyes: Conjunctivae are normal. Pupils are equal, round, and reactive to light. Left eye exhibits no discharge. No scleral icterus.  Neck: Normal range of motion. Neck supple. JVD present. No tracheal deviation present. No thyromegaly present.  Cardiovascular:  Tachycardic S1-S2 soft there is soft systolic murmur and S3 gallop noted  Respiratory:  Decreased breath sound at bases with faint rales  GI: Soft. Bowel sounds are normal. He exhibits distension. There is no tenderness. There is no rebound.  Musculoskeletal: He exhibits no edema or tenderness.  Neurological: He is alert and oriented to person, place, and time.    Assessment/Plan: Acute on chronic systolic congestive heart failure secondary to noncompliance to medications Minimally elevated troponin I secondary to above doubt significant MI Nonischemic dilated cardiomyopathy Mild CAD Hypertension Chronic myeloid leukemia History of CVA 2 in the  past History of EtOH abuse Tobacco abuse History of TIA in the past Plan Increase carvedilol to 6.25 mg twice daily Hold BiDil in view of tachycardia for now Change Ace inhibitors to ARB in view of coughing Add low-dose Aldactone Check 2-D echo  [name] 01/25/2015, 11:49 AM

## 2015-01-25 NOTE — Progress Notes (Signed)
  Echocardiogram 2D Echocardiogram has been performed.  Lysle Rubens 01/25/2015, 1:18 PM

## 2015-01-26 ENCOUNTER — Observation Stay (HOSPITAL_COMMUNITY): Payer: Medicare Other

## 2015-01-26 DIAGNOSIS — I639 Cerebral infarction, unspecified: Secondary | ICD-10-CM | POA: Diagnosis not present

## 2015-01-26 DIAGNOSIS — I110 Hypertensive heart disease with heart failure: Secondary | ICD-10-CM | POA: Diagnosis not present

## 2015-01-26 DIAGNOSIS — Z9119 Patient's noncompliance with other medical treatment and regimen: Secondary | ICD-10-CM | POA: Diagnosis not present

## 2015-01-26 DIAGNOSIS — I5043 Acute on chronic combined systolic (congestive) and diastolic (congestive) heart failure: Secondary | ICD-10-CM | POA: Diagnosis not present

## 2015-01-26 DIAGNOSIS — I2510 Atherosclerotic heart disease of native coronary artery without angina pectoris: Secondary | ICD-10-CM | POA: Diagnosis not present

## 2015-01-26 DIAGNOSIS — I5023 Acute on chronic systolic (congestive) heart failure: Secondary | ICD-10-CM | POA: Diagnosis not present

## 2015-01-26 DIAGNOSIS — R7989 Other specified abnormal findings of blood chemistry: Secondary | ICD-10-CM | POA: Diagnosis not present

## 2015-01-26 DIAGNOSIS — R109 Unspecified abdominal pain: Secondary | ICD-10-CM | POA: Diagnosis not present

## 2015-01-26 MED ORDER — FUROSEMIDE 10 MG/ML IJ SOLN
20.0000 mg | Freq: Two times a day (BID) | INTRAMUSCULAR | Status: DC
  Administered 2015-01-26 – 2015-01-27 (×3): 20 mg via INTRAVENOUS
  Filled 2015-01-26 (×3): qty 2

## 2015-01-26 MED ORDER — MORPHINE SULFATE (PF) 2 MG/ML IV SOLN
1.0000 mg | INTRAVENOUS | Status: DC | PRN
  Administered 2015-01-26 – 2015-01-29 (×5): 2 mg via INTRAVENOUS
  Filled 2015-01-26 (×5): qty 1

## 2015-01-26 MED ORDER — TRAMADOL HCL 50 MG PO TABS
100.0000 mg | ORAL_TABLET | Freq: Four times a day (QID) | ORAL | Status: DC | PRN
  Administered 2015-01-26: 100 mg via ORAL
  Filled 2015-01-26: qty 2

## 2015-01-26 MED ORDER — DIGOXIN 125 MCG PO TABS
0.1250 mg | ORAL_TABLET | Freq: Every day | ORAL | Status: DC
  Administered 2015-01-26 – 2015-01-29 (×4): 0.125 mg via ORAL
  Filled 2015-01-26 (×4): qty 1

## 2015-01-26 NOTE — Progress Notes (Signed)
[facility] PROGRESS NOTE  RONDEY FALLEN UDJ:[id number] DOB: [birth year] DOA: 01/24/2015 PCP: [name], MD  Assessment/Plan: 1. Acute on chronic systolic heart failure: -improving.  - daily weights and strict intake and output.  - so far was able to diuresis less than a liter.  - lasix 20 gm every 12 hours followed by spironolactone.  - resume aspirin and plavix,  - mild elevated troponins probably from acute heart failure.  - resume coreg and losartan and dig was added by cardiology.  - repeat echocardiogram shows depressed LVEF.    Hypokalemia: repleted as needed.    Abdominal pain: Unclear etiology, getting abd film to see if any incarceration of bowel or bowel obstruction.    Hypertension:  Controlled.    Code Status: full code.  Family Communication: none at bedside.  Disposition Plan: 1 to2 days.    Consultants:  Cardiology.   Procedures:  none  Antibiotics:  none  HPI/Subjective Coughing, reports some mid abdominal pain, pointing to ventral hernia.   Objective: Filed Vitals:   01/26/15 1453  BP: 110/76  Pulse: 88  Temp: 97.6 F (36.4 C)  Resp: 18    Intake/Output Summary (Last 24 hours) at 01/26/15 1606 Last data filed at 01/26/15 1454  Gross per 24 hour  Intake   1083 ml  Output    500 ml  Net    583 ml   Filed Weights   01/24/15 2248 01/25/15 0418 01/26/15 0532  Weight: 73.936 kg (163 lb) 76.5 kg (168 lb 10.4 oz) 76.4 kg (168 lb 6.9 oz)    Exam:   General:  Alert afebrile comfortable.   Cardiovascular: s1s2,   Respiratory: clear , no wheezing or rhonchi.   Abdomen: soft non tender non distended bowel sounds good.   Musculoskeletal: no pedal edema.   Data Reviewed: Basic Metabolic Panel:  Recent Labs Lab 01/24/15 2347 01/25/15 0340  NA 140 136  K 3.4* 4.1  CL 105 104  CO2 22 18*  GLUCOSE 97 204*  BUN 13 14  CREATININE 1.11 1.07  CALCIUM 9.2 8.9   Liver Function Tests:  Recent Labs Lab  01/25/15 0340  AST 66*  ALT 33  ALKPHOS 289*  BILITOT 3.9*  PROT 7.8  ALBUMIN 3.8   No results for input(s): LIPASE, AMYLASE in the last 168 hours. No results for input(s): AMMONIA in the last 168 hours. CBC:  Recent Labs Lab 01/24/15 2347 01/25/15 0340  WBC 6.3 6.8  NEUTROABS 3.5 5.9  HGB 12.2* 11.8*  HCT 38.6* 36.9*  MCV 102.9* 101.7*  PLT 473* 477*   Cardiac Enzymes:  Recent Labs Lab 01/24/15 2347 01/25/15 0340 01/25/15 0936 01/25/15 1510  TROPONINI 0.35* 0.36* 0.42* 0.37*   BNP (last 3 results)  Recent Labs  09/15/14 0406 09/20/14 2047 01/25/15 0340  BNP 1838.9* 2397.1* 2425.8*    ProBNP (last 3 results)  Recent Labs  03/05/14 1515  PROBNP 376.5*    CBG: No results for input(s): GLUCAP in the last 168 hours.  No results found for this or any previous visit (from the past 240 hour(s)).   Studies: Dg Chest 2 View  01/25/2015  CLINICAL DATA:  Shortness of breath, dizziness, cough, and congestion for 1-2 weeks. Former smoker. History of leukemia. EXAM: CHEST  2 VIEW COMPARISON:  09/18/2014 FINDINGS: Mild cardiac enlargement with normal pulmonary vascularity. No focal airspace disease or consolidation in the lungs. No blunting of costophrenic angles. No pneumothorax. Vague nodular opacities over the mid lungs likely representing  prominent nipple shadows. Mediastinal contours appear intact. Degenerative changes in the spine and shoulders. IMPRESSION: No active cardiopulmonary disease. Electronically Signed   By: Lucienne Capers M.D.   On: 01/25/2015 00:21   Dg Abd 2 Views  01/26/2015  CLINICAL DATA:  Abdominal pain with diarrhea EXAM: ABDOMEN - 2 VIEW COMPARISON:  CT abdomen 09/13/2014 FINDINGS: Normal bowel gas pattern. Negative for bowel obstruction or ileus. No free air. Calcifications to the right of L1 compatible with calcified lymph nodes identified on CT. No renal calculi. No acute bony abnormality. Orthopedic rod in the right femur. IMPRESSION:  No acute abnormality. Electronically Signed   By: Franchot Gallo M.D.   On: 01/26/2015 14:09    Scheduled Meds: . aspirin  81 mg Oral Daily  . atorvastatin  40 mg Oral q1800  . carvedilol  6.25 mg Oral BID WC  . clopidogrel  75 mg Oral Daily  . digoxin  0.125 mg Oral Daily  . enoxaparin (LOVENOX) injection  40 mg Subcutaneous Q24H  . losartan  50 mg Oral Daily  . sodium chloride  3 mL Intravenous Q12H  . spironolactone  25 mg Oral Daily   Continuous Infusions:   Principal Problem:   Elevated troponin Active Problems:   CML (chronic myelocytic leukemia) (HCC)   TIA (transient ischemic attack)   Acute on chronic combined systolic and diastolic CHF (congestive heart failure) (HCC)   Essential hypertension   Coronary artery disease   Compliance poor    Time spent: 25 minutes.     [place] [facility] Pager [phone] If 7PM-7AM, please contact night-coverage at [email or web address], password [facility] 01/26/2015, 4:06 PM  LOS: 1 day

## 2015-01-26 NOTE — Progress Notes (Signed)
Subjective:  Denies any chest pain states breathing has improved complains of constipation and abdominal swelling. Review 2-D echo showed severely depressed LV systolic function EF approximately 10-15%  Objective:  Vital Signs in the last 24 hours: Temp:  [97.5 F (36.4 C)-98.2 F (36.8 C)] 97.5 F (36.4 C) (10/16 0532) Pulse Rate:  [81-89] 85 (10/16 0532) Resp:  [18-20] 18 (10/16 0532) BP: (101-116)/(73-85) 114/74 mmHg (10/16 0532) SpO2:  [100 %] 100 % (10/16 0532) Weight:  [76.4 kg (168 lb 6.9 oz)] 76.4 kg (168 lb 6.9 oz) (10/16 0532)  Intake/Output from previous day: 10/15 0701 - 10/16 0700 In: 1563 [P.O.:1560; I.V.:3] Out: 700 [Urine:700] Intake/Output from this shift: Total I/O In: 3 [I.V.:3] Out: -   Physical Exam: Exam unchanged   Lab Results:  Recent Labs  01/24/15 2347 01/25/15 0340  WBC 6.3 6.8  HGB 12.2* 11.8*  PLT 473* 477*    Recent Labs  01/24/15 2347 01/25/15 0340  NA 140 136  K 3.4* 4.1  CL 105 104  CO2 22 18*  GLUCOSE 97 204*  BUN 13 14  CREATININE 1.11 1.07    Recent Labs  01/25/15 0936 01/25/15 1510  TROPONINI 0.42* 0.37*   Hepatic Function Panel  Recent Labs  01/25/15 0340  PROT 7.8  ALBUMIN 3.8  AST 66*  ALT 33  ALKPHOS 289*  BILITOT 3.9*   No results for input(s): CHOL in the last 72 hours. No results for input(s): PROTIME in the last 72 hours.  Imaging: Imaging results have been reviewed and Dg Chest 2 View  01/25/2015  CLINICAL DATA:  Shortness of breath, dizziness, cough, and congestion for 1-2 weeks. Former smoker. History of leukemia. EXAM: CHEST  2 VIEW COMPARISON:  09/18/2014 FINDINGS: Mild cardiac enlargement with normal pulmonary vascularity. No focal airspace disease or consolidation in the lungs. No blunting of costophrenic angles. No pneumothorax. Vague nodular opacities over the mid lungs likely representing prominent nipple shadows. Mediastinal contours appear intact. Degenerative changes in the spine and  shoulders. IMPRESSION: No active cardiopulmonary disease. Electronically Signed   By: Lucienne Capers M.D.   On: 01/25/2015 00:21    Cardiac Studies:  Assessment/Plan:  Resolving Acute on chronic systolic congestive heart failure secondary to noncompliance to medications Minimally elevated troponin I secondary to above doubt significant MI Nonischemic dilated cardiomyopathy Mild CAD Hypertension Chronic myeloid leukemia History of CVA 2 in the past History of EtOH abuse Tobacco abuse History of TIA in the past Constipation Plan Add digoxin as per orders Rx for constipation  LOS: 1 day    [name] 01/26/2015, 11:19 AM

## 2015-01-27 DIAGNOSIS — I110 Hypertensive heart disease with heart failure: Secondary | ICD-10-CM | POA: Diagnosis not present

## 2015-01-27 DIAGNOSIS — I2510 Atherosclerotic heart disease of native coronary artery without angina pectoris: Secondary | ICD-10-CM | POA: Diagnosis not present

## 2015-01-27 DIAGNOSIS — I5043 Acute on chronic combined systolic (congestive) and diastolic (congestive) heart failure: Secondary | ICD-10-CM | POA: Diagnosis not present

## 2015-01-27 DIAGNOSIS — I639 Cerebral infarction, unspecified: Secondary | ICD-10-CM | POA: Diagnosis not present

## 2015-01-27 DIAGNOSIS — I5023 Acute on chronic systolic (congestive) heart failure: Secondary | ICD-10-CM | POA: Diagnosis not present

## 2015-01-27 DIAGNOSIS — R7989 Other specified abnormal findings of blood chemistry: Secondary | ICD-10-CM | POA: Diagnosis not present

## 2015-01-27 LAB — BASIC METABOLIC PANEL
Anion gap: 8 (ref 5–15)
BUN: 24 mg/dL — AB (ref 6–20)
CO2: 25 mmol/L (ref 22–32)
CREATININE: 1.24 mg/dL (ref 0.61–1.24)
Calcium: 8.9 mg/dL (ref 8.9–10.3)
Chloride: 103 mmol/L (ref 101–111)
GFR calc Af Amer: >60 mL/min (ref >60)
GFR, EST NON AFRICAN AMERICAN: 58 mL/min — AB (ref >60)
Glucose, Bld: 119 mg/dL — ABNORMAL HIGH (ref 65–99)
Potassium: 3.9 mmol/L (ref 3.5–5.1)
SODIUM: 136 mmol/L (ref 135–145)

## 2015-01-27 LAB — BRAIN NATRIURETIC PEPTIDE: B NATRIURETIC PEPTIDE 5: 1206.9 pg/mL — AB (ref 0.0–100.0)

## 2015-01-27 LAB — MAGNESIUM: MAGNESIUM: 2.2 mg/dL (ref 1.7–2.4)

## 2015-01-27 MED ORDER — FUROSEMIDE 20 MG PO TABS: 20.0000 mg | ORAL_TABLET | Freq: Every day | ORAL | Status: DC

## 2015-01-27 NOTE — Progress Notes (Signed)
[facility] PROGRESS NOTE  Bruce [name] FFM:[id number] DOB: Feb 14, 1946 DOA: 01/24/2015 PCP: [name], MD  Assessment/Plan: 1. Acute on chronic systolic heart failure: -improving.  - daily weights and strict intake and output.  - so far was able to diuresis less than a liter.  - lasix 20 gm every 12 hours followed by spironolactone.  - resume aspirin and plavix,  - mild elevated troponins probably from acute heart failure.  - resume coreg and losartan and dig was added by cardiology.  - repeat echocardiogram shows depressed LVEF.    Hypokalemia: repleted as needed.    Abdominal pain: Resolve.d probably constipated.  abd film shows no obstruction.    Hypertension:  Controlled.    Code Status: full code.  Family Communication: none at bedside.  Disposition Plan: home after PT eval.    Consultants:  Cardiology.   Procedures:  none  Antibiotics:  none  HPI/Subjective No complaints.   Objective: Filed Vitals:   01/27/15 1455  BP: 107/62  Pulse: 71  Temp: 98.3 F (36.8 C)  Resp: 18    Intake/Output Summary (Last 24 hours) at 01/27/15 1914 Last data filed at 01/27/15 1454  Gross per 24 hour  Intake    720 ml  Output    725 ml  Net     -5 ml   Filed Weights   01/25/15 0418 01/26/15 0532 01/27/15 0446  Weight: 76.5 kg (168 lb 10.4 oz) 76.4 kg (168 lb 6.9 oz) 76.5 kg (168 lb 10.4 oz)    Exam:   General:  Alert afebrile comfortable.   Cardiovascular: s1s2,   Respiratory: clear , no wheezing or rhonchi.   Abdomen: soft non tender non distended bowel sounds good.   Musculoskeletal: no pedal edema.   Data Reviewed: Basic Metabolic Panel:  Recent Labs Lab 01/24/15 2347 01/25/15 0340 01/27/15 0455  NA 140 136 136  K 3.4* 4.1 3.9  CL 105 104 103  CO2 22 18* 25  GLUCOSE 97 204* 119*  BUN 13 14 24*  CREATININE 1.11 1.07 1.24  CALCIUM 9.2 8.9 8.9  MG  --   --  2.2   Liver Function Tests:  Recent Labs Lab  01/25/15 0340  AST 66*  ALT 33  ALKPHOS 289*  BILITOT 3.9*  PROT 7.8  ALBUMIN 3.8   No results for input(s): LIPASE, AMYLASE in the last 168 hours. No results for input(s): AMMONIA in the last 168 hours. CBC:  Recent Labs Lab 01/24/15 2347 01/25/15 0340  WBC 6.3 6.8  NEUTROABS 3.5 5.9  HGB 12.2* 11.8*  HCT 38.6* 36.9*  MCV 102.9* 101.7*  PLT 473* 477*   Cardiac Enzymes:  Recent Labs Lab 01/24/15 2347 01/25/15 0340 01/25/15 0936 01/25/15 1510  TROPONINI 0.35* 0.36* 0.42* 0.37*   BNP (last 3 results)  Recent Labs  09/20/14 2047 01/25/15 0340 01/27/15 0455  BNP 2397.1* 2425.8* 1206.9*    ProBNP (last 3 results)  Recent Labs  03/05/14 1515  PROBNP 376.5*    CBG: No results for input(s): GLUCAP in the last 168 hours.  No results found for this or any previous visit (from the past 240 hour(s)).   Studies: Dg Abd 2 Views  01/26/2015  CLINICAL DATA:  Abdominal pain with diarrhea EXAM: ABDOMEN - 2 VIEW COMPARISON:  CT abdomen 09/13/2014 FINDINGS: Normal bowel gas pattern. Negative for bowel obstruction or ileus. No free air. Calcifications to the right of L1 compatible with calcified lymph nodes identified on CT. No renal calculi. No  acute bony abnormality. Orthopedic rod in the right femur. IMPRESSION: No acute abnormality. Electronically Signed   By: Franchot Gallo M.D.   On: 01/26/2015 14:09    Scheduled Meds: . aspirin  81 mg Oral Daily  . atorvastatin  40 mg Oral q1800  . carvedilol  6.25 mg Oral BID WC  . clopidogrel  75 mg Oral Daily  . digoxin  0.125 mg Oral Daily  . enoxaparin (LOVENOX) injection  40 mg Subcutaneous Q24H  . furosemide  20 mg Intravenous BID  . losartan  50 mg Oral Daily  . sodium chloride  3 mL Intravenous Q12H  . spironolactone  25 mg Oral Daily   Continuous Infusions:   Principal Problem:   Elevated troponin Active Problems:   CML (chronic myelocytic leukemia) (HCC)   TIA (transient ischemic attack)   Acute on  chronic combined systolic and diastolic CHF (congestive heart failure) (HCC)   Essential hypertension   Coronary artery disease   Compliance poor    Time spent: 15 minutes.     [place] [facility] Pager [phone] If 7PM-7AM, please contact night-coverage at [email or web address], password [facility] 01/27/2015, 7:14 PM  LOS: 2 days

## 2015-01-27 NOTE — Progress Notes (Signed)
Subjective:  Denies any chest pain.  States breathing is improved.  Complains of constipation.  Objective:  Vital Signs in the last 24 hours: Temp:  [97.2 F (36.2 C)-97.6 F (36.4 C)] 97.2 F (36.2 C) (10/17 0446) Pulse Rate:  [71-88] 81 (10/17 0907) Resp:  [18-20] 18 (10/17 0446) BP: (100-111)/(69-84) 111/84 mmHg (10/17 0446) SpO2:  [95 %-100 %] 100 % (10/17 0446) Weight:  [76.5 kg (168 lb 10.4 oz)] 76.5 kg (168 lb 10.4 oz) (10/17 0446)  Intake/Output from previous day: 10/16 0701 - 10/17 0700 In: 483 [P.O.:480; I.V.:3] Out: 575 [Urine:575] Intake/Output from this shift: Total I/O In: -  Out: 200 [Urine:200]  Physical Exam: Neck: no adenopathy, no carotid bruit, no JVD and supple, symmetrical, trachea midline Lungs: decreased breath sounds at right base, otherwise air entry improved Heart: regular rate and rhythm, S1, S2 normal and soft systolic murmur noted Abdomen: soft, non-tender; bowel sounds normal; no masses,  no organomegaly Extremities: extremities normal, atraumatic, no cyanosis or edema  Lab Results:  Recent Labs  01/24/15 2347 01/25/15 0340  WBC 6.3 6.8  HGB 12.2* 11.8*  PLT 473* 477*    Recent Labs  01/25/15 0340 01/27/15 0455  NA 136 136  K 4.1 3.9  CL 104 103  CO2 18* 25  GLUCOSE 204* 119*  BUN 14 24*  CREATININE 1.07 1.24    Recent Labs  01/25/15 0936 01/25/15 1510  TROPONINI 0.42* 0.37*   Hepatic Function Panel  Recent Labs  01/25/15 0340  PROT 7.8  ALBUMIN 3.8  AST 66*  ALT 33  ALKPHOS 289*  BILITOT 3.9*   No results for input(s): CHOL in the last 72 hours. No results for input(s): PROTIME in the last 72 hours.  Imaging: Imaging results have been reviewed and Dg Abd 2 Views  01/26/2015  CLINICAL DATA:  Abdominal pain with diarrhea EXAM: ABDOMEN - 2 VIEW COMPARISON:  CT abdomen 09/13/2014 FINDINGS: Normal bowel gas pattern. Negative for bowel obstruction or ileus. No free air. Calcifications to the right of L1  compatible with calcified lymph nodes identified on CT. No renal calculi. No acute bony abnormality. Orthopedic rod in the right femur. IMPRESSION: No acute abnormality. Electronically Signed   By: Franchot Gallo M.D.   On: 01/26/2015 14:09    Cardiac Studies:  Assessment/Plan:  Resolving Acute on chronic systolic congestive heart failure secondary to noncompliance to medications Minimally elevated troponin I secondary to above doubt significant MI Nonischemic dilated cardiomyopathy Mild CAD Hypertension Chronic myeloid leukemia History of CVA 2 in the past History of EtOH abuse Tobacco abuse History of TIA in the past Constipation Plan Continue present management. Hospital discharge tomorrow if stable from cardiac point of view.  LOS: 2 days    [name] 01/27/2015, 12:54 PM

## 2015-01-27 NOTE — Care Management Note (Signed)
Case Management Note  Patient Details  Name: JONN CHAIKIN MRN: [id number] Date of Birth: 24-Jan-1946  Subjective/Objective:69 y/o m admitted w/SOB, elevated trops. From home alone.Has pcp,pharmacy.  Recommend HHRN-disease mgmnt. [facility] chosen rep Kristen aware. Await HHRN [id number] order.                    Action/Plan:d/c plan home w/HHC.   Expected Discharge Date:                  Expected Discharge Plan:  [place]  In-House Referral:     Discharge planning Services  CM Consult  Post Acute Care Choice:    Choice offered to:  Patient  DME Arranged:    DME Agency:     HH Arranged:    [place] Agency:  [place]  Status of Service:  In process, will continue to follow  Medicare Important Message Given:    Date Medicare IM Given:    Medicare IM give by:    Date Additional Medicare IM Given:    Additional Medicare Important Message give by:     If discussed at [place] of Stay Meetings, dates discussed:    Additional Comments:  Dessa Phi, RN 01/27/2015, 1:43 PM

## 2015-01-27 NOTE — Progress Notes (Signed)
CMT notified writer of patient having a 7 beat run of VTach. Patient asymptomatic. PA on call notified. Will continue to monitor.

## 2015-01-28 DIAGNOSIS — I639 Cerebral infarction, unspecified: Secondary | ICD-10-CM | POA: Diagnosis not present

## 2015-01-28 DIAGNOSIS — R7989 Other specified abnormal findings of blood chemistry: Secondary | ICD-10-CM | POA: Diagnosis not present

## 2015-01-28 DIAGNOSIS — I5023 Acute on chronic systolic (congestive) heart failure: Secondary | ICD-10-CM | POA: Diagnosis not present

## 2015-01-28 DIAGNOSIS — I2510 Atherosclerotic heart disease of native coronary artery without angina pectoris: Secondary | ICD-10-CM | POA: Diagnosis not present

## 2015-01-28 DIAGNOSIS — I5043 Acute on chronic combined systolic (congestive) and diastolic (congestive) heart failure: Secondary | ICD-10-CM | POA: Diagnosis not present

## 2015-01-28 DIAGNOSIS — I110 Hypertensive heart disease with heart failure: Secondary | ICD-10-CM | POA: Diagnosis not present

## 2015-01-28 LAB — BASIC METABOLIC PANEL
ANION GAP: 9 (ref 5–15)
BUN: 23 mg/dL — ABNORMAL HIGH (ref 6–20)
CHLORIDE: 105 mmol/L (ref 101–111)
CO2: 24 mmol/L (ref 22–32)
CREATININE: 1.13 mg/dL (ref 0.61–1.24)
Calcium: 8.8 mg/dL — ABNORMAL LOW (ref 8.9–10.3)
GFR calc non Af Amer: >60 mL/min (ref >60)
Glucose, Bld: 117 mg/dL — ABNORMAL HIGH (ref 65–99)
POTASSIUM: 3.8 mmol/L (ref 3.5–5.1)
SODIUM: 138 mmol/L (ref 135–145)

## 2015-01-28 MED ORDER — FUROSEMIDE 40 MG PO TABS
40.0000 mg | ORAL_TABLET | Freq: Every day | ORAL | Status: DC
  Administered 2015-01-28 – 2015-01-29 (×2): 40 mg via ORAL
  Filled 2015-01-28 (×2): qty 1

## 2015-01-28 NOTE — Progress Notes (Signed)
Subjective:  Complains of shortness of breath associated with wheezing with minimal exertion. Also complains of constipation. States don't feel strong enough to go home today  Objective:  Vital Signs in the last 24 hours: Temp:  [98.1 F (36.7 C)-98.3 F (36.8 C)] 98.2 F (36.8 C) (10/18 0545) Pulse Rate:  [71-80] 80 (10/18 0825) Resp:  [18] 18 (10/18 0545) BP: (106-119)/(62-75) 119/67 mmHg (10/18 0825) SpO2:  [100 %] 100 % (10/18 0545) Weight:  [75.433 kg (166 lb 4.8 oz)] 75.433 kg (166 lb 4.8 oz) (10/18 0545)  Intake/Output from previous day: 10/17 0701 - 10/18 0700 In: 240 [P.O.:240] Out: 1025 [Urine:1025] Intake/Output from this shift:    Physical Exam: Neck: no adenopathy, no carotid bruit, no JVD and supple, symmetrical, trachea midline Lungs: Decreased breath sound at right base no wheezing Heart: regular rate and rhythm, S1, S2 normal and Soft systolic murmur and S3 gallop noted Abdomen: Distended bowel sounds present mild generalized tenderness Extremities: extremities normal, atraumatic, no cyanosis or edema  Lab Results: No results for input(s): WBC, HGB, PLT in the last 72 hours.  Recent Labs  01/27/15 0455 01/28/15 0512  NA 136 138  K 3.9 3.8  CL 103 105  CO2 25 24  GLUCOSE 119* 117*  BUN 24* 23*  CREATININE 1.24 1.13    Recent Labs  01/25/15 0936 01/25/15 1510  TROPONINI 0.42* 0.37*   Hepatic Function Panel No results for input(s): PROT, ALBUMIN, AST, ALT, ALKPHOS, BILITOT, BILIDIR, IBILI in the last 72 hours. No results for input(s): CHOL in the last 72 hours. No results for input(s): PROTIME in the last 72 hours.  Imaging: Imaging results have been reviewed and Dg Abd 2 Views  01/26/2015  CLINICAL DATA:  Abdominal pain with diarrhea EXAM: ABDOMEN - 2 VIEW COMPARISON:  CT abdomen 09/13/2014 FINDINGS: Normal bowel gas pattern. Negative for bowel obstruction or ileus. No free air. Calcifications to the right of L1 compatible with calcified lymph  nodes identified on CT. No renal calculi. No acute bony abnormality. Orthopedic rod in the right femur. IMPRESSION: No acute abnormality. Electronically Signed   By: Franchot Gallo M.D.   On: 01/26/2015 14:09    Cardiac Studies:  Assessment/Plan:  Resolving Acute on chronic systolic congestive heart failure secondary to noncompliance to medications Minimally elevated troponin I secondary to above doubt significant MI Nonischemic dilated cardiomyopathy Mild CAD Hypertension Chronic myeloid leukemia History of CVA 2 in the past History of EtOH abuse Tobacco abuse History of TIA in the past Constipation Plan Increase Lasix to 40 mg daily Rx for constipation   LOS: 3 days    [name] 01/28/2015, 9:18 AM

## 2015-01-28 NOTE — Evaluation (Signed)
Physical Therapy Evaluation Patient Details Name: Bruce [name] MRN: [id number] DOB: 02/06/1946 Today's Date: 01/28/2015   History of Present Illness  69 yo male admitted with elevated troponin. Hx of HTN, CVA, CML, CHF, TIA with residual facial droop and partial L eye blindness, ETOH use.   Clinical Impression  On eval, pt required Min guard -Min assist for mobility-walked ~150 feet x1 with RW and ~150'x1 with straight cane. LOB x1 during first 20 feet of ambulation. Pt reports dizziness when mobilizing. Unable to assess BP-no dynamaps available on unit at time of eval. Recommend HHPT, RW, and if possible, home health aide.     Follow Up Recommendations Home health PT; Intermittent supervision/assist    Equipment Recommendations  Rolling walker with 5" wheels    Recommendations for Other Services       Precautions / Restrictions Precautions Precautions: Fall Restrictions Weight Bearing Restrictions: No      Mobility  Bed Mobility Overal bed mobility: Modified Independent                Transfers Overall transfer level: Needs assistance Equipment used: Rolling walker (2 wheeled) Transfers: Sit to/from Stand              Ambulation/Gait Ambulation/Gait assistance: Min assist Ambulation Distance (Feet): 150 Feet (x2) Assistive device: Rolling walker (2 wheeled);Straight cane Gait Pattern/deviations: Step-through pattern;Decreased stride length;Antalgic     General Gait Details: LOB x1 with initial ambulation. Pt c/o some dizziness. Fatigues fairly easily and increased pain as distance progressed.   Stairs            Wheelchair Mobility    Modified Rankin (Stroke Patients Only)       Balance Overall balance assessment: Needs assistance         Standing balance support: Single extremity supported;During functional activity                                 Pertinent Vitals/Pain Pain Assessment: 0-10 Pain Score: 9  Pain  Location: R hip and back Pain Descriptors / Indicators: Sore;Aching Pain Intervention(s): Limited activity within patient's tolerance    Home Living Family/patient expects to be discharged to:: Private residence Living Arrangements: Alone     Home Access: Level entry     Home Layout: One level Home Equipment: Cane - single point      Prior Function Level of Independence: Independent with assistive device(s)         Comments: uses cane     Hand Dominance        Extremity/Trunk Assessment   Upper Extremity Assessment: Overall WFL for tasks assessed           Lower Extremity Assessment: Generalized weakness      Cervical / Trunk Assessment: Normal  Communication   Communication: No difficulties  Cognition Arousal/Alertness: Awake/alert Behavior During Therapy: WFL for tasks assessed/performed Overall Cognitive Status: Within Functional Limits for tasks assessed                      General Comments      Exercises        Assessment/Plan    PT Assessment Patient needs continued PT services  PT Diagnosis Difficulty walking;Generalized weakness   PT Problem List Decreased strength;Decreased activity tolerance;Decreased balance;Decreased mobility;Decreased knowledge of use of DME;Pain  PT Treatment Interventions DME instruction;Gait training;Functional mobility training;Therapeutic activities;Patient/family education;Balance training;Therapeutic exercise   PT Goals (Current  goals can be found in the Care Plan section) Acute Rehab PT Goals Patient Stated Goal: home soon PT Goal Formulation: With patient Time For Goal Achievement: 02/11/15 Potential to Achieve Goals: Good    Frequency Min 3X/week   Barriers to discharge        Co-evaluation               End of Session Equipment Utilized During Treatment: Gait belt Activity Tolerance: Patient limited by fatigue;Patient limited by pain Patient left: in bed;with call bell/phone  within reach;with bed alarm set      Functional Assessment Tool Used: clinical judgement Functional Limitation: Mobility: Walking and moving around Mobility: Walking and Moving Around Current Status ([id number]): At least 1 percent but less than 20 percent impaired, limited or restricted Mobility: Walking and Moving Around Goal Status [phone]): At least 1 percent but less than 20 percent impaired, limited or restricted    Time: 1320-1339 PT Time Calculation (min) (ACUTE ONLY): 19 min   Charges:   PT Evaluation $Initial PT Evaluation Tier I: 1 Procedure     PT G Codes:   PT G-Codes **NOT FOR INPATIENT CLASS** Functional Assessment Tool Used: clinical judgement Functional Limitation: Mobility: Walking and moving around Mobility: Walking and Moving Around Current Status ([id number]): At least 1 percent but less than 20 percent impaired, limited or restricted Mobility: Walking and Moving Around Goal Status [phone]): At least 1 percent but less than 20 percent impaired, limited or restricted    Weston Anna, MPT Pager: [phone]

## 2015-01-28 NOTE — Progress Notes (Signed)
[facility] PROGRESS NOTE  Bruce [name] ZJQ:[id number] DOB: 04-29-1945 DOA: 01/24/2015 PCP: [name], MD Brief History: Bruce [name] is a 69 y.o. male with Past medical history of coronary artery disease, hypertension, CVA, CML, chronic systolic CHF, presents with worsening sob and cough.  Assessment/Plan: 1. Acute on chronic systolic heart failure: -improving.  - daily weights and strict intake and output.  - diuresed about1.5 lit from admission, he was initially started on IV lasix and transitioned to po lasix 40 mg daily followed by spironolactone.  - resume aspirin and plavix,  - mild elevated troponins probably from acute heart failure.  - resume coreg and losartan and dig was added by cardiology.  - repeat echocardiogram shows depressed LVEF.  - plan for d/c in am   Hypokalemia: repleted as needed.    Abdominal pain: Resolve.d probably constipated.  abd film shows no obstruction.    Hypertension:  Controlled.   Leg cramps : Get magnesium and phos levels, with BMP in am.    Code Status: full code.  Family Communication: none at bedside.  Disposition Plan: home in am after PT eval.    Consultants:  Cardiology.   Procedures:  none  Antibiotics:  none  HPI/Subjective No complaints.  Feels exhausted today ,   Objective: Filed Vitals:   01/28/15 1448  BP: 115/72  Pulse: 72  Temp: 97.6 F (36.4 C)  Resp: 20    Intake/Output Summary (Last 24 hours) at 01/28/15 1934 Last data filed at 01/28/15 1600  Gross per 24 hour  Intake      0 ml  Output   2085 ml  Net  -2085 ml   Filed Weights   01/26/15 0532 01/27/15 0446 01/28/15 0545  Weight: 76.4 kg (168 lb 6.9 oz) 76.5 kg (168 lb 10.4 oz) 75.433 kg (166 lb 4.8 oz)    Exam:   General:  Alert afebrile comfortable.   Cardiovascular: s1s2,   Respiratory: clear , no wheezing or rhonchi.   Abdomen: soft non tender non distended bowel sounds good.   Musculoskeletal: no pedal  edema.   Data Reviewed: Basic Metabolic Panel:  Recent Labs Lab 01/24/15 2347 01/25/15 0340 01/27/15 0455 01/28/15 0512  NA 140 136 136 138  K 3.4* 4.1 3.9 3.8  CL 105 104 103 105  CO2 22 18* 25 24  GLUCOSE 97 204* 119* 117*  BUN 13 14 24* 23*  CREATININE 1.11 1.07 1.24 1.13  CALCIUM 9.2 8.9 8.9 8.8*  MG  --   --  2.2  --    Liver Function Tests:  Recent Labs Lab 01/25/15 0340  AST 66*  ALT 33  ALKPHOS 289*  BILITOT 3.9*  PROT 7.8  ALBUMIN 3.8   No results for input(s): LIPASE, AMYLASE in the last 168 hours. No results for input(s): AMMONIA in the last 168 hours. CBC:  Recent Labs Lab 01/24/15 2347 01/25/15 0340  WBC 6.3 6.8  NEUTROABS 3.5 5.9  HGB 12.2* 11.8*  HCT 38.6* 36.9*  MCV 102.9* 101.7*  PLT 473* 477*   Cardiac Enzymes:  Recent Labs Lab 01/24/15 2347 01/25/15 0340 01/25/15 0936 01/25/15 1510  TROPONINI 0.35* 0.36* 0.42* 0.37*   BNP (last 3 results)  Recent Labs  09/20/14 2047 01/25/15 0340 01/27/15 0455  BNP 2397.1* 2425.8* 1206.9*    ProBNP (last 3 results)  Recent Labs  03/05/14 1515  PROBNP 376.5*    CBG: No results for input(s): GLUCAP in the last 168 hours.  No results found  for this or any previous visit (from the past 240 hour(s)).   Studies: No results found.  Scheduled Meds: . aspirin  81 mg Oral Daily  . atorvastatin  40 mg Oral q1800  . carvedilol  6.25 mg Oral BID WC  . clopidogrel  75 mg Oral Daily  . digoxin  0.125 mg Oral Daily  . enoxaparin (LOVENOX) injection  40 mg Subcutaneous Q24H  . furosemide  40 mg Oral Daily  . losartan  50 mg Oral Daily  . sodium chloride  3 mL Intravenous Q12H  . spironolactone  25 mg Oral Daily   Continuous Infusions:   Principal Problem:   Elevated troponin Active Problems:   CML (chronic myelocytic leukemia) (HCC)   TIA (transient ischemic attack)   Acute on chronic combined systolic and diastolic CHF (congestive heart failure) (HCC)   Essential hypertension    Coronary artery disease   Compliance poor    Time spent: 15 minutes.     [facility] Pager [phone] If 7PM-7AM, please contact night-coverage at [email or web address], password [facility]ights 01/28/2015, 7:34 PM  LOS: 3 days

## 2015-01-28 NOTE — Care Management Note (Signed)
Case Management Note  Patient Details  Name: EVERETT EHRLER MRN: [id number] Date of Birth: 1946-04-11  Subjective/Objective:  PT recc HHPT, rw. [facility] rep Kristen aware of HHRN/HHPT recc. [facility] dme rep Pura Spice aware of home rw recc.await HHC/dme orders.                  Action/Plan:d/c plan home w/HHC/DME.   Expected Discharge Date:                  Expected Discharge Plan:  [place]  In-House Referral:     Discharge planning Services  CM Consult  Post Acute Care Choice:    Choice offered to:  Patient  DME Arranged:    DME Agency:     HH Arranged:    [place] Agency:  [place]  Status of Service:  In process, will continue to follow  Medicare Important Message Given:    Date Medicare IM Given:    Medicare IM give by:    Date Additional Medicare IM Given:    Additional Medicare Important Message give by:     If discussed at [place] of Stay Meetings, dates discussed:    Additional Comments:  Dessa Phi, RN 01/28/2015, 2:35 PM

## 2015-01-28 NOTE — Progress Notes (Signed)
PT Cancellation Note  Patient Details Name: Bruce [name] MRN: [id number] DOB: 1945/10/31   Cancelled Treatment:    Reason Eval/Treat Not Completed: Patient declined, no reason specified. Will try to check back later today. Thanks.   Weston Anna, MPT Pager: [phone]

## 2015-01-29 DIAGNOSIS — I2510 Atherosclerotic heart disease of native coronary artery without angina pectoris: Secondary | ICD-10-CM | POA: Diagnosis not present

## 2015-01-29 DIAGNOSIS — I5043 Acute on chronic combined systolic (congestive) and diastolic (congestive) heart failure: Secondary | ICD-10-CM | POA: Diagnosis not present

## 2015-01-29 DIAGNOSIS — I110 Hypertensive heart disease with heart failure: Secondary | ICD-10-CM | POA: Diagnosis not present

## 2015-01-29 DIAGNOSIS — Z9119 Patient's noncompliance with other medical treatment and regimen: Secondary | ICD-10-CM

## 2015-01-29 DIAGNOSIS — R7989 Other specified abnormal findings of blood chemistry: Secondary | ICD-10-CM | POA: Diagnosis not present

## 2015-01-29 DIAGNOSIS — I10 Essential (primary) hypertension: Secondary | ICD-10-CM

## 2015-01-29 DIAGNOSIS — C9210 Chronic myeloid leukemia, BCR/ABL-positive, not having achieved remission: Secondary | ICD-10-CM

## 2015-01-29 DIAGNOSIS — G459 Transient cerebral ischemic attack, unspecified: Secondary | ICD-10-CM

## 2015-01-29 DIAGNOSIS — I639 Cerebral infarction, unspecified: Secondary | ICD-10-CM | POA: Diagnosis not present

## 2015-01-29 DIAGNOSIS — I5023 Acute on chronic systolic (congestive) heart failure: Secondary | ICD-10-CM | POA: Diagnosis not present

## 2015-01-29 LAB — BASIC METABOLIC PANEL
Anion gap: 11 (ref 5–15)
BUN: 19 mg/dL (ref 6–20)
CHLORIDE: 103 mmol/L (ref 101–111)
CO2: 22 mmol/L (ref 22–32)
CREATININE: 1.23 mg/dL (ref 0.61–1.24)
Calcium: 9.1 mg/dL (ref 8.9–10.3)
GFR calc Af Amer: >60 mL/min (ref >60)
GFR calc non Af Amer: 58 mL/min — ABNORMAL LOW (ref >60)
GLUCOSE: 126 mg/dL — AB (ref 65–99)
POTASSIUM: 4.5 mmol/L (ref 3.5–5.1)
SODIUM: 136 mmol/L (ref 135–145)

## 2015-01-29 LAB — PHOSPHORUS: Phosphorus: 2.7 mg/dL (ref 2.5–4.6)

## 2015-01-29 LAB — MAGNESIUM: Magnesium: 2 mg/dL (ref 1.7–2.4)

## 2015-01-29 MED ORDER — ISOSORB DINITRATE-HYDRALAZINE 20-37.5 MG PO TABS: 0.5000 | ORAL_TABLET | Freq: Two times a day (BID) | ORAL | Status: DC

## 2015-01-29 MED ORDER — PANTOPRAZOLE SODIUM 40 MG PO TBEC: 40.0000 mg | DELAYED_RELEASE_TABLET | Freq: Every day | ORAL | Status: DC

## 2015-01-29 MED ORDER — LOSARTAN POTASSIUM 50 MG PO TABS: 50.0000 mg | ORAL_TABLET | Freq: Every day | ORAL | Status: DC

## 2015-01-29 MED ORDER — SPIRONOLACTONE 25 MG PO TABS: 25.0000 mg | ORAL_TABLET | Freq: Every day | ORAL | Status: DC

## 2015-01-29 MED ORDER — DIGOXIN 125 MCG PO TABS: 0.1250 mg | ORAL_TABLET | Freq: Every day | ORAL | Status: DC

## 2015-01-29 MED ORDER — FUROSEMIDE 40 MG PO TABS: 60.0000 mg | ORAL_TABLET | Freq: Every day | ORAL | Status: DC

## 2015-01-29 MED ORDER — OXYCODONE-ACETAMINOPHEN 5-325 MG PO TABS: 1.0000 | ORAL_TABLET | Freq: Three times a day (TID) | ORAL | Status: DC | PRN

## 2015-01-29 MED ORDER — CARVEDILOL 6.25 MG PO TABS: 6.2500 mg | ORAL_TABLET | Freq: Two times a day (BID) | ORAL | Status: DC

## 2015-01-29 NOTE — Progress Notes (Signed)
Physical Therapy Treatment Patient Details Name: NUMAIR MASDEN MRN: [id number] DOB: 01-21-1946 Today's Date: 01/29/2015    History of Present Illness 69 yo male admitted with elevated troponin. Hx of HTN, CVA, CML, CHF, TIA with residual facial droop and partial L eye blindness, ETOH use.     PT Comments    Some improvement in mobility and activity tolerance on today. Still unsteady at times. Continue to recommend HHPT and RW use for safety, if pt will agree.   Follow Up Recommendations  Home health PT     Equipment Recommendations  Rolling walker with 5" wheels    Recommendations for Other Services       Precautions / Restrictions Precautions Precautions: Fall Restrictions Weight Bearing Restrictions: No    Mobility  Bed Mobility Overal bed mobility: Modified Independent                Transfers Overall transfer level: Modified independent                  Ambulation/Gait Ambulation/Gait assistance: Min guard Ambulation Distance (Feet): 175 Feet Assistive device: Straight cane Gait Pattern/deviations: Step-through pattern;Decreased stride length     General Gait Details: close guard. Unsteady at times. Tolerated distance well this session. Pt denied dizziness.    Stairs            Wheelchair Mobility    Modified Rankin (Stroke Patients Only)       Balance           Standing balance support: Single extremity supported;During functional activity Standing balance-Leahy Scale: Fair                      Cognition Arousal/Alertness: Awake/alert Behavior During Therapy: WFL for tasks assessed/performed Overall Cognitive Status: Within Functional Limits for tasks assessed                      Exercises      General Comments        Pertinent Vitals/Pain Pain Assessment: No/denies pain    Home Living                      Prior Function            PT Goals (current goals can now be found in  the care plan section) Progress towards PT goals: Progressing toward goals    Frequency  Min 3X/week    PT Plan Current plan remains appropriate    Co-evaluation             End of Session Equipment Utilized During Treatment: Gait belt Activity Tolerance: Patient tolerated treatment well Patient left: in bed;with call bell/phone within reach     Time: 1028-1036 PT Time Calculation (min) (ACUTE ONLY): 8 min  Charges:  $Gait Training: 8-22 mins                    G Codes:  Functional Assessment Tool Used: clinical judgement Functional Limitation: Mobility: Walking and moving around Mobility: Walking and Moving Around Goal Status [phone]): At least 1 percent but less than 20 percent impaired, limited or restricted Mobility: Walking and Moving Around Discharge Status [phone]): At least 1 percent but less than 20 percent impaired, limited or restricted   Weston Anna, MPT Pager: [phone]

## 2015-01-29 NOTE — Progress Notes (Signed)
Patient is A&Ox4 and ambulatory with use of cane .Discharge Instruction provided. Additional Instruction provided r/t CHF and continued care. Pt aware of f/u visit with Dr. Terrence Dupont

## 2015-01-29 NOTE — Progress Notes (Signed)
Subjective:  Denies any chest pain states breathing has improved after increasing Lasix. Ready to go home.  Objective:  Vital Signs in the last 24 hours: Temp:  [97.2 F (36.2 C)-97.6 F (36.4 C)] 97.2 F (36.2 C) (10/19 0553) Pulse Rate:  [72-76] 72 (10/19 1022) Resp:  [20] 20 (10/19 0553) BP: (111-128)/(72-86) 125/86 mmHg (10/19 1022) SpO2:  [97 %-98 %] 98 % (10/19 0553) Weight:  [75.978 kg (167 lb 8 oz)] 75.978 kg (167 lb 8 oz) (10/19 0553)  Intake/Output from previous day: 10/18 0701 - 10/19 0700 In: 240 [P.O.:240] Out: 2110 [Urine:2110] Intake/Output from this shift: Total I/O In: 240 [P.O.:240] Out: 500 [Urine:500]  Physical Exam: Neck: no adenopathy, no carotid bruit, no JVD and supple, symmetrical, trachea midline Lungs: clear to auscultation bilaterally Heart: regular rate and rhythm, S1, S2 normal and Soft systolic murmur noted Abdomen: soft, non-tender; bowel sounds normal; no masses,  no organomegaly Extremities: extremities normal, atraumatic, no cyanosis or edema  Lab Results: No results for input(s): WBC, HGB, PLT in the last 72 hours.  Recent Labs  01/28/15 0512 01/29/15 0550  NA 138 136  K 3.8 4.5  CL 105 103  CO2 24 22  GLUCOSE 117* 126*  BUN 23* 19  CREATININE 1.13 1.23   No results for input(s): TROPONINI in the last 72 hours.  Invalid input(s): CK, MB Hepatic Function Panel No results for input(s): PROT, ALBUMIN, AST, ALT, ALKPHOS, BILITOT, BILIDIR, IBILI in the last 72 hours. No results for input(s): CHOL in the last 72 hours. No results for input(s): PROTIME in the last 72 hours.  Imaging: Imaging results have been reviewed and No results found.  Cardiac Studies:  Assessment/Plan:  Compensated systolic congestive heart failure secondary to noncompliance to medications Minimally elevated troponin I secondary to above doubt significant MI Nonischemic dilated cardiomyopathy EF approximately 15% Mild CAD Hypertension Chronic myeloid  leukemia History of CVA 2 in the past History of EtOH abuse Tobacco abuse History of TIA in the past Plan Agree with increasing Lasix Okay to discharge from cardiac point of view Heart failure instructions have been given patient has been advised to comply with medications and diet and monitor weight daily Follow-up with me in one week  LOS: 4 days    [name] 01/29/2015, 11:53 AM

## 2015-01-29 NOTE — Care Management Note (Signed)
Case Management Note  Patient Details  Name: Bruce [name] MRN: [id number] Date of Birth: 1945/11/14  Subjective/Objective:   [facility] rep Kristen aware of d/c & HHC orders.[facility] dme rep Lecretia aware of d/c & home rw order.                 Action/Plan:d/c home w/HHC/dme.   Expected Discharge Date:                  Expected Discharge Plan:  [place]  In-House Referral:     Discharge planning Services  CM Consult  Post Acute Care Choice:    Choice offered to:  Patient  DME Arranged:  Walker rolling DME Agency:  [place] Arranged:  RN, PT, Nurse's Aide [place] Agency:  [facility]  Status of Service:  Completed, signed off  Medicare Important Message Given:    Date Medicare IM Given:    Medicare IM give by:    Date Additional Medicare IM Given:    Additional Medicare Important Message give by:     If discussed at [place] of Stay Meetings, dates discussed:    Additional Comments:  Dessa Phi, RN 01/29/2015, 10:24 AM

## 2015-01-29 NOTE — Discharge Summary (Signed)
Physician Discharge Summary  Bruce [name] HAL:[id number] DOB: 1945-11-27 DOA: 01/24/2015  PCP: [name], MD  Admit date: 01/24/2015 Discharge date: 01/29/2015  Time spent: 35 minutes  Recommendations for Outpatient Follow-up:  1. BMET to follow electrolytes and renal function 2. Assess volume and continue adjusting medications for his heart failure as needed   Discharge Diagnoses:  Principal Problem:   Elevated troponin Active Problems:   CML (chronic myelocytic leukemia) (HCC)   TIA (transient ischemic attack)   Acute on chronic combined systolic and diastolic CHF (congestive heart failure) (HCC)   Essential hypertension   Coronary artery disease   Compliance poor   Discharge Condition: stable and improved. Discharge home with home health services and follow up with PCP/cardiologist in 10 days  Diet recommendation: low sodium diet (less than 2 gram)  Filed Weights   01/27/15 0446 01/28/15 0545 01/29/15 0553  Weight: 76.5 kg (168 lb 10.4 oz) 75.433 kg (166 lb 4.8 oz) 75.978 kg (167 lb 8 oz)    History of present illness:  69 y/o with PMH of systolic heart failure, HTN, HLD, TIA/hx of stroke, COPD and prior hx of CVA; presented to ED secondary to worsening SOB, orthopnea and LE swelling.  Hospital Course:  1-acute on chronic systolic heart failure -patient was not compliant with his medication prior to admission -education about compliance and low sodium diet discussed with patient -will discharge on lasix 60mg  daily, spironolactone (25 mg), coreg 6.25 mg BID and cozaar 50mg  tablet -patient aldo started on digoxin daily as per cardiology rec's -breathing improved/stable at discharge -patient will follow with cardiologist/PCP at discharge (Dr. Terrence Dupont), for further medication adjustment and to track renal function trend -last EF 15%  2-HTN: chronic and well controlled. -will continue current antihypertensive regimen  3-COPD: no need of O2  supplementation -patient no longer smoking -will continue PRN inhaler therapy   4-hx of CVA/ TIA: will continue ASA and plavix for secondary prevention  5-CML: recent diagnosis  -CBC appears stable -continue outpatient follow up  12-HLD: continue statins  13-hx of CAD: no CP -elevated troponin during this admission due to demand ischemia with heart failure exacerbation - will continue ASA, coreg, plavix and cozzaar   14-physical deconditioning: [facility] services arranged at discharge. RN and HHPT.   Procedures:  Echocardiogram: 10/15 - Left ventricle: The cavity size was moderately dilated. The estimated ejection fraction was 15%. Diffuse hypokinesis. - Atrial septum: No defect or patent foramen ovale was identified. - Tricuspid valve: There was moderate regurgitation. - Pulmonary arteries: Systolic pressure was moderately increased. PA peak pressure: 45 mm Hg (S).  Consultations:  Cardiology   Discharge Exam: Filed Vitals:   01/29/15 0553  BP: 128/86  Pulse: 76  Temp: 97.2 F (36.2 C)  Resp: 20    General: afebrile, denying CP and with significant improvement in his breathing.   Cardiovascular: S1 and S2, positive SEM, no rubs or gallops on exam, no JVD appreciated on exam Respiratory: no wheezing, improved air movement, no frank rales and with just scattered rhonchi  Abd: soft, NT, ND, positive BS Neurology: no focal deficit  Discharge Instructions   Discharge Instructions    Diet - low sodium heart healthy    Complete by:  As directed      Discharge instructions    Complete by:  As directed   Take medications as prescribed Please arrange follow up with Dr. Terrence Dupont in 10 days Follow low sodium diet (< 2 gram daily) Check your weight on daily  basis          Current Discharge Medication List    START taking these medications   Details  digoxin (LANOXIN) 0.125 MG tablet Take 1 tablet (0.125 mg total) by mouth daily. Qty: 30 tablet, Refills: 1     losartan (COZAAR) 50 MG tablet Take 1 tablet (50 mg total) by mouth daily. Qty: 30 tablet, Refills: 1    spironolactone (ALDACTONE) 25 MG tablet Take 1 tablet (25 mg total) by mouth daily. Qty: 30 tablet, Refills: 1      CONTINUE these medications which have CHANGED   Details  carvedilol (COREG) 6.25 MG tablet Take 1 tablet (6.25 mg total) by mouth 2 (two) times daily with a meal. Qty: 60 tablet, Refills: 1    furosemide (LASIX) 40 MG tablet Take 1.5 tablets (60 mg total) by mouth daily. Qty: 45 tablet, Refills: 1    isosorbide-hydrALAZINE (BIDIL) 20-37.5 MG tablet Take 0.5 tablets by mouth 2 (two) times daily. Qty: 60 tablet, Refills: 1    oxyCODONE-acetaminophen (PERCOCET/ROXICET) 5-325 MG tablet Take 1 tablet by mouth every 8 (eight) hours as needed for severe pain. pain Qty: 20 tablet, Refills: 0    pantoprazole (PROTONIX) 40 MG tablet Take 1 tablet (40 mg total) by mouth daily. Qty: 30 tablet, Refills: 1      CONTINUE these medications which have NOT CHANGED   Details  albuterol (PROVENTIL HFA;VENTOLIN HFA) 108 (90 BASE) MCG/ACT inhaler Inhale 2 puffs into the lungs every 4 (four) hours as needed for wheezing or shortness of breath. Qty: 1 Inhaler, Refills: 3    aspirin 81 MG chewable tablet Chew 1 tablet (81 mg total) by mouth daily.    atorvastatin (LIPITOR) 40 MG tablet Take 40 mg by mouth every morning.  Refills: 0    clopidogrel (PLAVIX) 75 MG tablet Take 1 tablet (75 mg total) by mouth daily. Qty: 30 tablet, Refills: 3    polyethylene glycol (MIRALAX / GLYCOLAX) packet Take 17 g by mouth daily. Qty: 14 each, Refills: 0    calcium carbonate (TUMS - DOSED IN MG ELEMENTAL CALCIUM) 500 MG chewable tablet Chew 2 tablets (400 mg of elemental calcium total) by mouth every 6 (six) hours as needed for indigestion or heartburn.    potassium chloride SA (K-DUR,KLOR-CON) 20 MEQ tablet Take 1 tablet (20 mEq total) by mouth 2 (two) times daily. Qty: 10 tablet, Refills: 0       STOP taking these medications     diphenhydramine-acetaminophen (TYLENOL PM) 25-500 MG TABS      benzonatate (TESSALON) 100 MG capsule        No Known Allergies Follow-up Information    Follow up with [name], MD. Schedule an appointment as soon as possible for a visit in 10 days.   Specialty:  Cardiology   Contact information:   [place] [address] [phone]       Follow up with [facility].   Why:  HHRN/HHPT/HH Nurse's aide   Contact information:   [address] [place] [address] [phone]       Follow up with [facility].   Why:  rolling walker   Contact information:   [address] [place] [address] [phone]       The results of significant diagnostics from this hospitalization (including imaging, microbiology, ancillary and laboratory) are listed below for reference.    Significant Diagnostic Studies: Dg Chest 2  View  01/25/2015  CLINICAL DATA:  Shortness of breath, dizziness, cough, and congestion for 1-2 weeks. Former smoker. History of leukemia. EXAM: CHEST  2 VIEW COMPARISON:  09/18/2014 FINDINGS: Mild cardiac enlargement with normal pulmonary vascularity. No focal airspace disease or consolidation in the lungs. No blunting of costophrenic angles. No pneumothorax. Vague nodular opacities over the mid lungs likely representing prominent nipple shadows. Mediastinal contours appear intact. Degenerative changes in the spine and shoulders. IMPRESSION: No active cardiopulmonary disease. Electronically Signed   By: Lucienne Capers M.D.   On: 01/25/2015 00:21   Dg Abd 2 Views  01/26/2015  CLINICAL DATA:  Abdominal pain with diarrhea EXAM: ABDOMEN - 2 VIEW COMPARISON:  CT abdomen 09/13/2014 FINDINGS: Normal bowel gas pattern. Negative for bowel obstruction or ileus. No free air. Calcifications to the right of L1 compatible with calcified lymph nodes identified  on CT. No renal calculi. No acute bony abnormality. Orthopedic rod in the right femur. IMPRESSION: No acute abnormality. Electronically Signed   By: Franchot Gallo M.D.   On: 01/26/2015 14:09   Labs: Basic Metabolic Panel:  Recent Labs Lab 01/24/15 2347 01/25/15 0340 01/27/15 0455 01/28/15 0512 01/29/15 0550  NA 140 136 136 138 136  K 3.4* 4.1 3.9 3.8 4.5  CL 105 104 103 105 103  CO2 22 18* 25 24 22   GLUCOSE 97 204* 119* 117* 126*  BUN 13 14 24* 23* 19  CREATININE 1.11 1.07 1.24 1.13 1.23  CALCIUM 9.2 8.9 8.9 8.8* 9.1  MG  --   --  2.2  --  2.0  PHOS  --   --   --   --  2.7   Liver Function Tests:  Recent Labs Lab 01/25/15 0340  AST 66*  ALT 33  ALKPHOS 289*  BILITOT 3.9*  PROT 7.8  ALBUMIN 3.8   CBC:  Recent Labs Lab 01/24/15 2347 01/25/15 0340  WBC 6.3 6.8  NEUTROABS 3.5 5.9  HGB 12.2* 11.8*  HCT 38.6* 36.9*  MCV 102.9* 101.7*  PLT 473* 477*   Cardiac Enzymes:  Recent Labs Lab 01/24/15 2347 01/25/15 0340 01/25/15 0936 01/25/15 1510  TROPONINI 0.35* 0.36* 0.42* 0.37*   BNP: BNP (last 3 results)  Recent Labs  09/20/14 2047 01/25/15 0340 01/27/15 0455  BNP 2397.1* 2425.8* 1206.9*    ProBNP (last 3 results)  Recent Labs  03/05/14 1515  PROBNP 376.5*    Signed:  Barton Dubois  [facility] 01/29/2015, 10:19 AM

## 2015-01-29 NOTE — Progress Notes (Signed)
Patient stated that he did not have money for transportation to home. SW contacted to assist with transportation, awaiting call back. Pt is not agreeable to waiting for SW to f/u at this time. Pt was encouraged to stay until transportation was put in place. Pt left with belongings cane and walker included.

## 2015-01-30 ENCOUNTER — Emergency Department (HOSPITAL_COMMUNITY)
Admission: EM | Admit: 2015-01-30 | Discharge: 2015-01-30 | Discharge status: Against Medical Advice | Payer: Medicare Other

## 2015-01-30 NOTE — ED Notes (Signed)
Patient was asked what brings him here tonight. Patient got upset because of the question. Patient left.

## 2015-01-31 DIAGNOSIS — F329 Major depressive disorder, single episode, unspecified: Secondary | ICD-10-CM | POA: Diagnosis not present

## 2015-01-31 DIAGNOSIS — J90 Pleural effusion, not elsewhere classified: Secondary | ICD-10-CM | POA: Diagnosis not present

## 2015-01-31 DIAGNOSIS — R011 Cardiac murmur, unspecified: Secondary | ICD-10-CM | POA: Diagnosis not present

## 2015-01-31 DIAGNOSIS — I252 Old myocardial infarction: Secondary | ICD-10-CM | POA: Diagnosis not present

## 2015-01-31 DIAGNOSIS — R1084 Generalized abdominal pain: Secondary | ICD-10-CM | POA: Diagnosis not present

## 2015-01-31 DIAGNOSIS — C9210 Chronic myeloid leukemia, BCR/ABL-positive, not having achieved remission: Secondary | ICD-10-CM | POA: Diagnosis not present

## 2015-01-31 DIAGNOSIS — I517 Cardiomegaly: Secondary | ICD-10-CM | POA: Diagnosis not present

## 2015-01-31 DIAGNOSIS — F419 Anxiety disorder, unspecified: Secondary | ICD-10-CM | POA: Diagnosis not present

## 2015-01-31 DIAGNOSIS — Z9119 Patient's noncompliance with other medical treatment and regimen: Secondary | ICD-10-CM | POA: Diagnosis not present

## 2015-01-31 DIAGNOSIS — R0609 Other forms of dyspnea: Secondary | ICD-10-CM | POA: Diagnosis not present

## 2015-01-31 DIAGNOSIS — I2510 Atherosclerotic heart disease of native coronary artery without angina pectoris: Secondary | ICD-10-CM | POA: Diagnosis not present

## 2015-01-31 DIAGNOSIS — F1919 Other psychoactive substance abuse with unspecified psychoactive substance-induced disorder: Secondary | ICD-10-CM | POA: Diagnosis not present

## 2015-01-31 DIAGNOSIS — Z7982 Long term (current) use of aspirin: Secondary | ICD-10-CM | POA: Diagnosis not present

## 2015-01-31 DIAGNOSIS — Z8673 Personal history of transient ischemic attack (TIA), and cerebral infarction without residual deficits: Secondary | ICD-10-CM | POA: Diagnosis not present

## 2015-01-31 DIAGNOSIS — I110 Hypertensive heart disease with heart failure: Secondary | ICD-10-CM | POA: Diagnosis not present

## 2015-01-31 DIAGNOSIS — R0600 Dyspnea, unspecified: Secondary | ICD-10-CM | POA: Diagnosis not present

## 2015-01-31 DIAGNOSIS — Z79899 Other long term (current) drug therapy: Secondary | ICD-10-CM | POA: Diagnosis not present

## 2015-02-03 DIAGNOSIS — I5043 Acute on chronic combined systolic (congestive) and diastolic (congestive) heart failure: Secondary | ICD-10-CM | POA: Diagnosis not present

## 2015-02-03 DIAGNOSIS — I2510 Atherosclerotic heart disease of native coronary artery without angina pectoris: Secondary | ICD-10-CM | POA: Diagnosis not present

## 2015-02-03 DIAGNOSIS — C9310 Chronic myelomonocytic leukemia not having achieved remission: Secondary | ICD-10-CM | POA: Diagnosis not present

## 2015-02-03 DIAGNOSIS — I10 Essential (primary) hypertension: Secondary | ICD-10-CM | POA: Diagnosis not present

## 2015-02-05 DIAGNOSIS — I10 Essential (primary) hypertension: Secondary | ICD-10-CM | POA: Diagnosis not present

## 2015-02-05 DIAGNOSIS — I2510 Atherosclerotic heart disease of native coronary artery without angina pectoris: Secondary | ICD-10-CM | POA: Diagnosis not present

## 2015-02-05 DIAGNOSIS — I5043 Acute on chronic combined systolic (congestive) and diastolic (congestive) heart failure: Secondary | ICD-10-CM | POA: Diagnosis not present

## 2015-02-05 DIAGNOSIS — C9310 Chronic myelomonocytic leukemia not having achieved remission: Secondary | ICD-10-CM | POA: Diagnosis not present

## 2015-02-06 DIAGNOSIS — C9310 Chronic myelomonocytic leukemia not having achieved remission: Secondary | ICD-10-CM | POA: Diagnosis not present

## 2015-02-06 DIAGNOSIS — I2510 Atherosclerotic heart disease of native coronary artery without angina pectoris: Secondary | ICD-10-CM | POA: Diagnosis not present

## 2015-02-06 DIAGNOSIS — I10 Essential (primary) hypertension: Secondary | ICD-10-CM | POA: Diagnosis not present

## 2015-02-06 DIAGNOSIS — I5043 Acute on chronic combined systolic (congestive) and diastolic (congestive) heart failure: Secondary | ICD-10-CM | POA: Diagnosis not present

## 2015-02-10 DIAGNOSIS — I10 Essential (primary) hypertension: Secondary | ICD-10-CM | POA: Diagnosis not present

## 2015-02-10 DIAGNOSIS — I2510 Atherosclerotic heart disease of native coronary artery without angina pectoris: Secondary | ICD-10-CM | POA: Diagnosis not present

## 2015-02-10 DIAGNOSIS — I1310 Hypertensive heart and chronic kidney disease without heart failure, with stage 1 through stage 4 chronic kidney disease, or unspecified chronic kidney disease: Secondary | ICD-10-CM | POA: Diagnosis not present

## 2015-02-10 DIAGNOSIS — I420 Dilated cardiomyopathy: Secondary | ICD-10-CM | POA: Diagnosis not present

## 2015-02-10 DIAGNOSIS — N189 Chronic kidney disease, unspecified: Secondary | ICD-10-CM | POA: Diagnosis not present

## 2015-02-10 DIAGNOSIS — I5043 Acute on chronic combined systolic (congestive) and diastolic (congestive) heart failure: Secondary | ICD-10-CM | POA: Diagnosis not present

## 2015-02-10 DIAGNOSIS — G894 Chronic pain syndrome: Secondary | ICD-10-CM | POA: Diagnosis not present

## 2015-02-10 DIAGNOSIS — E785 Hyperlipidemia, unspecified: Secondary | ICD-10-CM | POA: Diagnosis not present

## 2015-02-10 DIAGNOSIS — C9310 Chronic myelomonocytic leukemia not having achieved remission: Secondary | ICD-10-CM | POA: Diagnosis not present

## 2015-02-13 DIAGNOSIS — F310 Bipolar disorder, current episode hypomanic: Secondary | ICD-10-CM | POA: Diagnosis not present

## 2015-02-13 DIAGNOSIS — C9590 Leukemia, unspecified not having achieved remission: Secondary | ICD-10-CM | POA: Diagnosis not present

## 2015-02-19 DIAGNOSIS — I10 Essential (primary) hypertension: Secondary | ICD-10-CM | POA: Diagnosis not present

## 2015-02-19 DIAGNOSIS — I5043 Acute on chronic combined systolic (congestive) and diastolic (congestive) heart failure: Secondary | ICD-10-CM | POA: Diagnosis not present

## 2015-02-19 DIAGNOSIS — C9310 Chronic myelomonocytic leukemia not having achieved remission: Secondary | ICD-10-CM | POA: Diagnosis not present

## 2015-02-19 DIAGNOSIS — I2510 Atherosclerotic heart disease of native coronary artery without angina pectoris: Secondary | ICD-10-CM | POA: Diagnosis not present

## 2015-02-20 ENCOUNTER — Encounter (HOSPITAL_COMMUNITY): Payer: Self-pay | Admitting: Emergency Medicine

## 2015-02-20 ENCOUNTER — Emergency Department (HOSPITAL_COMMUNITY): Payer: Medicare Other

## 2015-02-20 ENCOUNTER — Emergency Department (HOSPITAL_COMMUNITY)
Admission: EM | Admit: 2015-02-20 | Discharge: 2015-02-20 | Disposition: A | Discharge status: Home / Self Care | Payer: Medicare Other | Attending: Emergency Medicine | Admitting: Emergency Medicine

## 2015-02-20 DIAGNOSIS — Z8669 Personal history of other diseases of the nervous system and sense organs: Secondary | ICD-10-CM | POA: Diagnosis not present

## 2015-02-20 DIAGNOSIS — R109 Unspecified abdominal pain: Secondary | ICD-10-CM | POA: Diagnosis not present

## 2015-02-20 DIAGNOSIS — I2510 Atherosclerotic heart disease of native coronary artery without angina pectoris: Secondary | ICD-10-CM | POA: Insufficient documentation

## 2015-02-20 DIAGNOSIS — R1013 Epigastric pain: Secondary | ICD-10-CM | POA: Insufficient documentation

## 2015-02-20 DIAGNOSIS — Z79899 Other long term (current) drug therapy: Secondary | ICD-10-CM | POA: Diagnosis not present

## 2015-02-20 DIAGNOSIS — Z8673 Personal history of transient ischemic attack (TIA), and cerebral infarction without residual deficits: Secondary | ICD-10-CM | POA: Diagnosis not present

## 2015-02-20 DIAGNOSIS — Z8679 Personal history of other diseases of the circulatory system: Secondary | ICD-10-CM

## 2015-02-20 DIAGNOSIS — E7800 Pure hypercholesterolemia, unspecified: Secondary | ICD-10-CM | POA: Insufficient documentation

## 2015-02-20 DIAGNOSIS — R1033 Periumbilical pain: Secondary | ICD-10-CM | POA: Diagnosis not present

## 2015-02-20 DIAGNOSIS — R188 Other ascites: Secondary | ICD-10-CM | POA: Diagnosis not present

## 2015-02-20 DIAGNOSIS — Z87891 Personal history of nicotine dependence: Secondary | ICD-10-CM | POA: Diagnosis not present

## 2015-02-20 DIAGNOSIS — I10 Essential (primary) hypertension: Secondary | ICD-10-CM | POA: Insufficient documentation

## 2015-02-20 DIAGNOSIS — R0602 Shortness of breath: Secondary | ICD-10-CM | POA: Insufficient documentation

## 2015-02-20 DIAGNOSIS — Z856 Personal history of leukemia: Secondary | ICD-10-CM | POA: Insufficient documentation

## 2015-02-20 DIAGNOSIS — Z7902 Long term (current) use of antithrombotics/antiplatelets: Secondary | ICD-10-CM | POA: Insufficient documentation

## 2015-02-20 DIAGNOSIS — Z7982 Long term (current) use of aspirin: Secondary | ICD-10-CM | POA: Insufficient documentation

## 2015-02-20 LAB — COMPREHENSIVE METABOLIC PANEL
ALK PHOS: 203 U/L — AB (ref 38–126)
ALT: 17 U/L (ref 17–63)
ANION GAP: 9 (ref 5–15)
AST: 33 U/L (ref 15–41)
Albumin: 3.8 g/dL (ref 3.5–5.0)
BUN: 16 mg/dL (ref 6–20)
CALCIUM: 9.3 mg/dL (ref 8.9–10.3)
CHLORIDE: 109 mmol/L (ref 101–111)
CO2: 26 mmol/L (ref 22–32)
Creatinine, Ser: 1.16 mg/dL (ref 0.61–1.24)
GFR calc non Af Amer: >60 mL/min (ref >60)
Glucose, Bld: 140 mg/dL — ABNORMAL HIGH (ref 65–99)
Potassium: 3.5 mmol/L (ref 3.5–5.1)
SODIUM: 144 mmol/L (ref 135–145)
Total Bilirubin: 2.3 mg/dL — ABNORMAL HIGH (ref 0.3–1.2)
Total Protein: 7.7 g/dL (ref 6.5–8.1)

## 2015-02-20 LAB — LIPASE, BLOOD: LIPASE: 33 U/L (ref 11–51)

## 2015-02-20 LAB — CBC
HCT: 37.8 % — ABNORMAL LOW (ref 39.0–52.0)
HEMOGLOBIN: 12 g/dL — AB (ref 13.0–17.0)
MCH: 31.6 pg (ref 26.0–34.0)
MCHC: 31.7 g/dL (ref 30.0–36.0)
MCV: 99.5 fL (ref 78.0–100.0)
Platelets: 436 10*3/uL — ABNORMAL HIGH (ref 150–400)
RBC: 3.8 MIL/uL — AB (ref 4.22–5.81)
RDW: 16.6 % — ABNORMAL HIGH (ref 11.5–15.5)
WBC: 6.6 10*3/uL (ref 4.0–10.5)

## 2015-02-20 MED ORDER — IOHEXOL 300 MG/ML  SOLN
100.0000 mL | Freq: Once | INTRAMUSCULAR | Status: AC | PRN
  Administered 2015-02-20: 100 mL via INTRAVENOUS

## 2015-02-20 MED ORDER — TRAMADOL HCL 50 MG PO TABS: 50.0000 mg | ORAL_TABLET | Freq: Four times a day (QID) | ORAL | Status: DC | PRN

## 2015-02-20 MED ORDER — IOHEXOL 300 MG/ML  SOLN
25.0000 mL | Freq: Once | INTRAMUSCULAR | Status: AC | PRN
  Administered 2015-02-20: 25 mL via ORAL

## 2015-02-20 NOTE — ED Notes (Addendum)
Pt from home c/o lower abdominal pain x few days and shortness of breath and dizziness on occassions. He reports last BM was this a.m.. He reports nausea but denies vomiting.  Hx of leukemia.  Lung sounds clear bilaterally however, small productive cough present. Abdominal distention noted.

## 2015-02-20 NOTE — Discharge Instructions (Signed)
Increase your Lasix to 80 mg twice daily.  Tramadol as prescribed as needed for pain.  Follow-up with your primary Dr. next week to be rechecked, and return to the ER if your symptoms significantly worsen or change.   Abdominal Pain, Adult Many things can cause abdominal pain. Usually, abdominal pain is not caused by a disease and will improve without treatment. It can often be observed and treated at home. Your health care provider will do a physical exam and possibly order blood tests and X-rays to help determine the seriousness of your pain. However, in many cases, more time must pass before a clear cause of the pain can be found. Before that point, your health care provider may not know if you need more testing or further treatment. HOME CARE INSTRUCTIONS Monitor your abdominal pain for any changes. The following actions may help to alleviate any discomfort you are experiencing:  Only take over-the-counter or prescription medicines as directed by your health care provider.  Do not take laxatives unless directed to do so by your health care provider.  Try a clear liquid diet (broth, tea, or water) as directed by your health care provider. Slowly move to a bland diet as tolerated. SEEK MEDICAL CARE IF:  You have unexplained abdominal pain.  You have abdominal pain associated with nausea or diarrhea.  You have pain when you urinate or have a bowel movement.  You experience abdominal pain that wakes you in the night.  You have abdominal pain that is worsened or improved by eating food.  You have abdominal pain that is worsened with eating fatty foods.  You have a fever. SEEK IMMEDIATE MEDICAL CARE IF:  Your pain does not go away within 2 hours.  You keep throwing up (vomiting).  Your pain is felt only in portions of the abdomen, such as the right side or the left lower portion of the abdomen.  You pass bloody or black tarry stools. MAKE SURE YOU:  Understand these  instructions.  Will watch your condition.  Will get help right away if you are not doing well or get worse.   This information is not intended to replace advice given to you by your health care provider. Make sure you discuss any questions you have with your health care provider.   Document Released: 01/06/2005 Document Revised: 12/18/2014 Document Reviewed: 12/06/2012 Elsevier Interactive Patient Education 2016 Elsevier Inc[employer]  Ascites Ascites is a collection of excess fluid in the abdomen. Ascites can range from mild to severe. It can get worse without treatment. CAUSES Possible causes include:  Cirrhosis. This is the most common cause of ascites.  Infection or inflammation in the abdomen.  Cancer in the abdomen.  Heart failure.  Kidney disease.  Inflammation of the pancreas.  Clots in the veins of the liver. SIGNS AND SYMPTOMS Signs and symptoms may include:  A feeling of fullness in your abdomen. This is common.  An increase in the size of your abdomen or your waist.  Swelling in your legs.  Swelling of the scrotum in men.  Difficulty breathing.  Abdominal pain.  Sudden weight gain. If the condition is mild, you may not have symptoms. DIAGNOSIS To make a diagnosis, your health care provider will:  Ask about your medical history.  Perform a physical exam.  Order imaging tests, such as an ultrasound or CT scan of your abdomen. TREATMENT Treatment depends on the cause of the ascites. It may include:  Taking a pill to make you urinate.  This is called a water pill (diuretic pill).  Strictly reducing your salt (sodium) intake. Salt can cause extra fluid to be kept in the body, and this makes ascites worse.  Having a procedure to remove fluid from your abdomen (paracentesis).  Having a procedure to transfer fluid from your abdomen into a vein.  Having a procedure that connects two of the major veins within your liver and relieves pressure on your liver  (TIPS procedure). Ascites may go away or improve with treatment of the condition that caused it.  HOME CARE INSTRUCTIONS  Keep track of your weight. To do this, weigh yourself at the same time every day and record your weight.  Keep track of how much you drink and any changes in the amount you urinate.  Follow any instructions that your health care provider gives you about how much to drink.  Try not to eat salty (high-sodium) foods.  Take medicines only as directed by your health care provider.  Keep all follow-up visits as directed by your health care provider. This is important.  Report any changes in your health to your health care provider, especially if you develop new symptoms or your symptoms get worse. SEEK MEDICAL CARE IF:  Your gain more than 3 pounds in 3 days.  Your abdominal size or your waist size increases.  You have new swelling in your legs.  The swelling in your legs gets worse. SEEK IMMEDIATE MEDICAL CARE IF:  You develop a fever.  You develop confusion.  You develop new or worsening difficulty breathing.  You develop new or worsening abdominal pain.  You develop new or worsening swelling in the scrotum (in men).   This information is not intended to replace advice given to you by your health care provider. Make sure you discuss any questions you have with your health care provider.   Document Released: 03/29/2005 Document Revised: 04/19/2014 Document Reviewed: 10/26/2013 Elsevier Interactive Patient Education [employer].

## 2015-02-20 NOTE — ED Notes (Signed)
Pt upset that the MD is not removing the "knot" from his abd; pt was advised that he had ascites and needs to increase his Lasix to twice a day; pt refused to listen to discharge instructions or to sign discharge release; pt states "I don't need no Trump doctor, I am going to [place] to get a black doctor"; RN attempted to talk with pt but pt refused and walked out the door.

## 2015-02-20 NOTE — ED Provider Notes (Addendum)
CSN: [id number]     Arrival date & time 02/20/15  1931 History   First MD Initiated Contact with Patient 02/20/15 2027     Chief Complaint  Patient presents with  . Abdominal Pain  . Shortness of Breath     (Consider location/radiation/quality/duration/timing/severity/associated sxs/prior Treatment) HPI Comments: Patient is a 69 year old male with past medical history of CML, CHF, hypertension. He presents for evaluation of abdominal discomfort and swelling he states has been worsening over the past several days. He denies any fevers or chills. Denies any difficulty breathing. He denies any chest pain or shortness of breath.  Patient is a 69 y.o. male presenting with abdominal pain and shortness of breath. The history is provided by the patient.  Abdominal Pain Pain location:  Periumbilical Pain quality: cramping   Pain radiates to:  Does not radiate Pain severity:  Moderate Duration:  3 days Timing:  Constant Chronicity:  New Associated symptoms: shortness of breath   Shortness of Breath Associated symptoms: abdominal pain     Past Medical History  Diagnosis Date  . Coronary artery disease   . Hypertension   . Hypercholesterolemia   . Stroke ([place])     No residual limb weakness.  Walks with cane at baseline.   Marland Kitchen CML (chronic myelocytic leukemia) ([place])   . TIA (transient ischemic attack) 05/10/2014  . Bell's palsy   . Leukemia [facility])    Past Surgical History  Procedure Laterality Date  . Back surgery    . Hip arthroplasty Right     orif  . Orif forearm fracture Right    Family History  Problem Relation Age of Onset  . Diabetes Mother   . Hypertension Mother   . Diabetes Father   . Hypertension Father   . Diabetes Brother   . Hypertension Brother   . Diabetes Sister   . Hypertension Sister   . Diabetes Brother   . Hypertension Brother   . Diabetes Sister   . Hypertension Sister    Social History  Substance Use Topics  . Smoking status: Former Smoker -- 0.50  packs/day for 50 years    Types: Cigarettes  . Smokeless tobacco: Never Used  . Alcohol Use: 0.6 oz/week    1 Cans of beer per week     Comment: daily     Review of Systems  Respiratory: Positive for shortness of breath.   Gastrointestinal: Positive for abdominal pain.  All other systems reviewed and are negative.     Allergies  Review of patient's allergies indicates no known allergies.  Home Medications   Prior to Admission medications   Medication Sig Start Date End Date Taking? Authorizing Provider  albuterol (PROVENTIL HFA;VENTOLIN HFA) 108 (90 BASE) MCG/ACT inhaler Inhale 2 puffs into the lungs every 4 (four) hours as needed for wheezing or shortness of breath. 08/08/14  Yes Noemi Chapel, MD  aspirin 81 MG chewable tablet Chew 1 tablet (81 mg total) by mouth daily. 09/22/14  Yes Hosie Poisson, MD  bosutinib (BOSULIF) 100 MG tablet Take 300 mg by mouth daily.  01/31/15  Yes Historical Provider, MD  carvedilol (COREG) 6.25 MG tablet Take 1 tablet (6.25 mg total) by mouth 2 (two) times daily with a meal. 01/29/15  Yes Barton Dubois, MD  clopidogrel (PLAVIX) 75 MG tablet Take 1 tablet (75 mg total) by mouth daily. 03/21/14  Yes [name], MD  digoxin (LANOXIN) 0.125 MG tablet Take 1 tablet (0.125 mg total) by mouth daily. 01/29/15  Yes Clifton James  Dyann Kief, MD  furosemide (LASIX) 40 MG tablet Take 1.5 tablets (60 mg total) by mouth daily. 01/29/15  Yes Barton Dubois, MD  isosorbide-hydrALAZINE (BIDIL) 20-37.5 MG tablet Take 0.5 tablets by mouth 2 (two) times daily. 01/29/15  Yes Barton Dubois, MD  losartan (COZAAR) 50 MG tablet Take 1 tablet (50 mg total) by mouth daily. 01/29/15  Yes Barton Dubois, MD  atorvastatin (LIPITOR) 40 MG tablet Take 40 mg by mouth every morning.  03/02/14   Historical Provider, MD  calcium carbonate (TUMS - DOSED IN MG ELEMENTAL CALCIUM) 500 MG chewable tablet Chew 2 tablets (400 mg of elemental calcium total) by mouth every 6 (six) hours as needed for  indigestion or heartburn. Patient not taking: Reported on 10/17/2014 09/22/14   Hosie Poisson, MD  oxyCODONE-acetaminophen (PERCOCET/ROXICET) 5-325 MG tablet Take 1 tablet by mouth every 8 (eight) hours as needed for severe pain. pain 01/29/15   Barton Dubois, MD  pantoprazole (PROTONIX) 40 MG tablet Take 1 tablet (40 mg total) by mouth daily. 01/29/15   Barton Dubois, MD  polyethylene glycol [facility] / GLYCOLAX) packet Take 17 g by mouth daily. Patient taking differently: Take 17 g by mouth daily as needed for moderate constipation.  09/22/14   Hosie Poisson, MD  potassium chloride SA (K-DUR,KLOR-CON) 20 MEQ tablet Take 1 tablet (20 mEq total) by mouth 2 (two) times daily. Patient not taking: Reported on 10/31/2014 07/31/14   Truitt Merle, MD  spironolactone (ALDACTONE) 25 MG tablet Take 1 tablet (25 mg total) by mouth daily. 01/29/15   Barton Dubois, MD   BP 137/83 mmHg  Pulse 89  Temp(Src) 97.8 F (36.6 C) (Oral)  Resp 30  Wt 167 lb (75.751 kg)  SpO2 100% Physical Exam  Constitutional: He is oriented to person, place, and time. He appears well-developed and well-nourished. No distress.  HENT:  Head: Normocephalic and atraumatic.  Neck: Normal range of motion. Neck supple.  Cardiovascular: Normal rate, regular rhythm and normal heart sounds.   No murmur heard. Pulmonary/Chest: Effort normal and breath sounds normal. No respiratory distress. He has no wheezes.  Abdominal: Soft. Bowel sounds are normal. He exhibits distension. There is tenderness. There is no rebound and no guarding.  There is mild distention and mild tenderness to palpation in the periumbilical region.  Neurological: He is alert and oriented to person, place, and time.  Skin: Skin is warm and dry. He is not diaphoretic.  Nursing note and vitals reviewed.   ED Course  Procedures (including critical care time) Labs Review Labs Reviewed  COMPREHENSIVE METABOLIC PANEL - Abnormal; Notable for the following:    Glucose, Bld 140  (*)    Alkaline Phosphatase 203 (*)    Total Bilirubin 2.3 (*)    All other components within normal limits  CBC - Abnormal; Notable for the following:    RBC 3.80 (*)    Hemoglobin 12.0 (*)    HCT 37.8 (*)    RDW 16.6 (*)    Platelets 436 (*)    All other components within normal limits  LIPASE, BLOOD  URINALYSIS, ROUTINE W REFLEX MICROSCOPIC (NOT AT [facility])    Imaging Review Ct Abdomen Pelvis W Contrast  02/20/2015  CLINICAL DATA:  Abdominal pain.  History of leukemia. EXAM: CT ABDOMEN AND PELVIS WITH CONTRAST TECHNIQUE: Multidetector CT imaging of the abdomen and pelvis was performed using the standard protocol following bolus administration of intravenous contrast. CONTRAST:  26mL OMNIPAQUE IOHEXOL 300 MG/ML SOLN, 167mL OMNIPAQUE IOHEXOL 300 MG/ML SOLN COMPARISON:  09/13/2014 FINDINGS: Lower chest and abdominal wall:  Anasarca, new. New cardiomegaly. Small layering right pleural effusion. No pulmonary edema at the bases. Hepatobiliary: Nutmeg appearance of the liver on delayed phase, compatible with passive congestion given cardiac findings and hepatic vein reflux. 10 mm low-density in the left liver which has a nonspecific appearance. This is best seen on delayed phase. Granulomatous changes within deep liver drainage lymph nodes.Gallstones seen June 2016 not clearly visible. No evidence of cholecystitis. Pancreas: Unremarkable. Spleen: Unremarkable. Adrenals/Urinary Tract: Negative adrenals. No hydronephrosis or stone. Unremarkable bladder. Reproductive:No pathologic findings. Stomach/Bowel:  No obstruction. No appendicitis. Vascular/Lymphatic: No acute vascular abnormality. Diffuse aortic and branch vessel atherosclerosis. No mass or adenopathy. Peritoneal: Moderate ascites, not clinically accessible, non loculated. Musculoskeletal: No acute abnormalities. Right femur ORIF. Diffuse disc degeneration. IMPRESSION: 1. Volume overload with findings of right heart failure and passive hepatic  congestion. 2. 11 mm lesion in the left liver which was not seen on 03/18/2014 abdominal CT. Recommend three-month follow-up, preferably liver MRI. Electronically Signed   By: Monte Fantasia M.D.   On: 02/20/2015 22:17   I have personally reviewed and evaluated these images and lab results as part of my medical decision-making.   EKG Interpretation   Date/Time:  Thursday February 20 2015 19:47:02 EST Ventricular Rate:  83 PR Interval:  151 QRS Duration: 98 QT Interval:  439 QTC Calculation: 516 R Axis:   -10 Text Interpretation:  Sinus rhythm Anteroseptal infarct, old Nonspecific T  abnormalities, lateral leads Prolonged QT interval Baseline wander in  lead(s) II III aVF No change from 01/24/2015 Confirmed by Oleda Borski  MD,  [place] ([id number]) on 02/20/2015 8:37:11 PM      MDM   Final diagnoses:  None    Workup reveals unremarkable laboratory studies. He does have a slight elevation of his alkaline phosphatase and total bili, but electrolytes are otherwise unremarkable. A CT scan of the abdomen and pelvis was obtained which reveals some ascites, the etiology of which I am uncertain. I suspect this is what is causing his discomfort. He does have the mention of volume overload and has a history of CHF. I will increase his Lasix to 80 mg twice daily from his previous dose and have him follow-up with his primary doctor.  While I was discussing the results of his tests, he informed me he "didn't have time for my bullshit" and he was going to see another doctor in [facility]. He refused to listen to my explanation of his test results and demanded his IV be removed.  I am not sure what set him off, however his behavior was extremely inappropriate and would not listen to what I had to tell him.      Veryl Speak, MD 02/20/15 [id number]  Veryl Speak, MD 02/20/15 [phone]

## 2015-02-21 DIAGNOSIS — I2510 Atherosclerotic heart disease of native coronary artery without angina pectoris: Secondary | ICD-10-CM | POA: Diagnosis not present

## 2015-02-21 DIAGNOSIS — I10 Essential (primary) hypertension: Secondary | ICD-10-CM | POA: Diagnosis not present

## 2015-02-21 DIAGNOSIS — C9310 Chronic myelomonocytic leukemia not having achieved remission: Secondary | ICD-10-CM | POA: Diagnosis not present

## 2015-02-21 DIAGNOSIS — I5043 Acute on chronic combined systolic (congestive) and diastolic (congestive) heart failure: Secondary | ICD-10-CM | POA: Diagnosis not present

## 2015-03-04 DIAGNOSIS — M25519 Pain in unspecified shoulder: Secondary | ICD-10-CM | POA: Diagnosis not present

## 2015-03-04 DIAGNOSIS — G8921 Chronic pain due to trauma: Secondary | ICD-10-CM | POA: Diagnosis not present

## 2015-03-04 DIAGNOSIS — I10 Essential (primary) hypertension: Secondary | ICD-10-CM | POA: Diagnosis not present

## 2015-03-04 DIAGNOSIS — G319 Degenerative disease of nervous system, unspecified: Secondary | ICD-10-CM | POA: Diagnosis not present

## 2015-03-13 DIAGNOSIS — I5043 Acute on chronic combined systolic (congestive) and diastolic (congestive) heart failure: Secondary | ICD-10-CM | POA: Diagnosis not present

## 2015-03-13 DIAGNOSIS — I2510 Atherosclerotic heart disease of native coronary artery without angina pectoris: Secondary | ICD-10-CM | POA: Diagnosis not present

## 2015-03-13 DIAGNOSIS — I10 Essential (primary) hypertension: Secondary | ICD-10-CM | POA: Diagnosis not present

## 2015-03-13 DIAGNOSIS — C9310 Chronic myelomonocytic leukemia not having achieved remission: Secondary | ICD-10-CM | POA: Diagnosis not present

## 2015-03-15 DIAGNOSIS — F310 Bipolar disorder, current episode hypomanic: Secondary | ICD-10-CM | POA: Diagnosis not present

## 2015-03-18 DIAGNOSIS — I5043 Acute on chronic combined systolic (congestive) and diastolic (congestive) heart failure: Secondary | ICD-10-CM | POA: Diagnosis not present

## 2015-03-18 DIAGNOSIS — C9310 Chronic myelomonocytic leukemia not having achieved remission: Secondary | ICD-10-CM | POA: Diagnosis not present

## 2015-03-18 DIAGNOSIS — I10 Essential (primary) hypertension: Secondary | ICD-10-CM | POA: Diagnosis not present

## 2015-03-18 DIAGNOSIS — I2510 Atherosclerotic heart disease of native coronary artery without angina pectoris: Secondary | ICD-10-CM | POA: Diagnosis not present

## 2015-03-25 DIAGNOSIS — C9310 Chronic myelomonocytic leukemia not having achieved remission: Secondary | ICD-10-CM | POA: Diagnosis not present

## 2015-03-25 DIAGNOSIS — I10 Essential (primary) hypertension: Secondary | ICD-10-CM | POA: Diagnosis not present

## 2015-03-25 DIAGNOSIS — I5043 Acute on chronic combined systolic (congestive) and diastolic (congestive) heart failure: Secondary | ICD-10-CM | POA: Diagnosis not present

## 2015-03-25 DIAGNOSIS — I2510 Atherosclerotic heart disease of native coronary artery without angina pectoris: Secondary | ICD-10-CM | POA: Diagnosis not present

## 2015-03-28 DIAGNOSIS — C9500 Acute leukemia of unspecified cell type not having achieved remission: Secondary | ICD-10-CM | POA: Diagnosis not present

## 2015-04-01 DIAGNOSIS — C9310 Chronic myelomonocytic leukemia not having achieved remission: Secondary | ICD-10-CM | POA: Diagnosis not present

## 2015-04-01 DIAGNOSIS — I10 Essential (primary) hypertension: Secondary | ICD-10-CM | POA: Diagnosis not present

## 2015-04-01 DIAGNOSIS — I2510 Atherosclerotic heart disease of native coronary artery without angina pectoris: Secondary | ICD-10-CM | POA: Diagnosis not present

## 2015-04-01 DIAGNOSIS — I5043 Acute on chronic combined systolic (congestive) and diastolic (congestive) heart failure: Secondary | ICD-10-CM | POA: Diagnosis not present

## 2015-04-02 DIAGNOSIS — G894 Chronic pain syndrome: Secondary | ICD-10-CM | POA: Diagnosis not present

## 2015-04-02 DIAGNOSIS — I429 Cardiomyopathy, unspecified: Secondary | ICD-10-CM | POA: Diagnosis not present

## 2015-04-02 DIAGNOSIS — N189 Chronic kidney disease, unspecified: Secondary | ICD-10-CM | POA: Diagnosis not present

## 2015-04-02 DIAGNOSIS — I1310 Hypertensive heart and chronic kidney disease without heart failure, with stage 1 through stage 4 chronic kidney disease, or unspecified chronic kidney disease: Secondary | ICD-10-CM | POA: Diagnosis not present

## 2015-04-02 DIAGNOSIS — E785 Hyperlipidemia, unspecified: Secondary | ICD-10-CM | POA: Diagnosis not present

## 2015-04-04 DIAGNOSIS — F419 Anxiety disorder, unspecified: Secondary | ICD-10-CM | POA: Diagnosis not present

## 2015-04-04 DIAGNOSIS — I509 Heart failure, unspecified: Secondary | ICD-10-CM | POA: Diagnosis not present

## 2015-04-04 DIAGNOSIS — C9210 Chronic myeloid leukemia, BCR/ABL-positive, not having achieved remission: Secondary | ICD-10-CM | POA: Diagnosis not present

## 2015-04-04 DIAGNOSIS — I2510 Atherosclerotic heart disease of native coronary artery without angina pectoris: Secondary | ICD-10-CM | POA: Diagnosis not present

## 2015-04-04 DIAGNOSIS — D689 Coagulation defect, unspecified: Secondary | ICD-10-CM | POA: Diagnosis not present

## 2015-04-04 DIAGNOSIS — I252 Old myocardial infarction: Secondary | ICD-10-CM | POA: Diagnosis not present

## 2015-04-04 DIAGNOSIS — Z79899 Other long term (current) drug therapy: Secondary | ICD-10-CM | POA: Diagnosis not present

## 2015-04-04 DIAGNOSIS — Z8673 Personal history of transient ischemic attack (TIA), and cerebral infarction without residual deficits: Secondary | ICD-10-CM | POA: Diagnosis not present

## 2015-04-04 DIAGNOSIS — C9500 Acute leukemia of unspecified cell type not having achieved remission: Secondary | ICD-10-CM | POA: Diagnosis not present

## 2015-04-04 DIAGNOSIS — Z9119 Patient's noncompliance with other medical treatment and regimen: Secondary | ICD-10-CM | POA: Diagnosis not present

## 2015-04-04 DIAGNOSIS — I10 Essential (primary) hypertension: Secondary | ICD-10-CM | POA: Diagnosis not present

## 2015-04-04 DIAGNOSIS — Z7982 Long term (current) use of aspirin: Secondary | ICD-10-CM | POA: Diagnosis not present

## 2015-04-08 DIAGNOSIS — I5043 Acute on chronic combined systolic (congestive) and diastolic (congestive) heart failure: Secondary | ICD-10-CM | POA: Diagnosis not present

## 2015-04-08 DIAGNOSIS — C9310 Chronic myelomonocytic leukemia not having achieved remission: Secondary | ICD-10-CM | POA: Diagnosis not present

## 2015-04-08 DIAGNOSIS — I2510 Atherosclerotic heart disease of native coronary artery without angina pectoris: Secondary | ICD-10-CM | POA: Diagnosis not present

## 2015-04-08 DIAGNOSIS — I10 Essential (primary) hypertension: Secondary | ICD-10-CM | POA: Diagnosis not present

## 2015-04-09 DIAGNOSIS — I1310 Hypertensive heart and chronic kidney disease without heart failure, with stage 1 through stage 4 chronic kidney disease, or unspecified chronic kidney disease: Secondary | ICD-10-CM | POA: Diagnosis not present

## 2015-04-09 DIAGNOSIS — G894 Chronic pain syndrome: Secondary | ICD-10-CM | POA: Diagnosis not present

## 2015-04-09 DIAGNOSIS — I420 Dilated cardiomyopathy: Secondary | ICD-10-CM | POA: Diagnosis not present

## 2015-04-09 DIAGNOSIS — I2510 Atherosclerotic heart disease of native coronary artery without angina pectoris: Secondary | ICD-10-CM | POA: Diagnosis not present

## 2015-04-09 DIAGNOSIS — N189 Chronic kidney disease, unspecified: Secondary | ICD-10-CM | POA: Diagnosis not present

## 2015-06-03 ENCOUNTER — Encounter (HOSPITAL_COMMUNITY): Payer: Self-pay | Admitting: *Deleted

## 2015-06-03 ENCOUNTER — Emergency Department (HOSPITAL_COMMUNITY): Payer: Medicare Other

## 2015-06-03 ENCOUNTER — Observation Stay (HOSPITAL_COMMUNITY)
Admission: EM | Admit: 2015-06-03 | Discharge: 2015-06-06 | Disposition: A | Discharge status: Home / Self Care | Payer: Medicare Other | Attending: Cardiology | Admitting: Cardiology

## 2015-06-03 DIAGNOSIS — Z79899 Other long term (current) drug therapy: Secondary | ICD-10-CM | POA: Insufficient documentation

## 2015-06-03 DIAGNOSIS — Z859 Personal history of malignant neoplasm, unspecified: Secondary | ICD-10-CM | POA: Insufficient documentation

## 2015-06-03 DIAGNOSIS — R2243 Localized swelling, mass and lump, lower limb, bilateral: Secondary | ICD-10-CM | POA: Diagnosis not present

## 2015-06-03 DIAGNOSIS — Z7982 Long term (current) use of aspirin: Secondary | ICD-10-CM | POA: Insufficient documentation

## 2015-06-03 DIAGNOSIS — Z8673 Personal history of transient ischemic attack (TIA), and cerebral infarction without residual deficits: Secondary | ICD-10-CM | POA: Insufficient documentation

## 2015-06-03 DIAGNOSIS — Z7902 Long term (current) use of antithrombotics/antiplatelets: Secondary | ICD-10-CM | POA: Diagnosis not present

## 2015-06-03 DIAGNOSIS — I10 Essential (primary) hypertension: Secondary | ICD-10-CM | POA: Diagnosis not present

## 2015-06-03 DIAGNOSIS — R0602 Shortness of breath: Secondary | ICD-10-CM | POA: Insufficient documentation

## 2015-06-03 DIAGNOSIS — Z87891 Personal history of nicotine dependence: Secondary | ICD-10-CM | POA: Diagnosis not present

## 2015-06-03 DIAGNOSIS — I2510 Atherosclerotic heart disease of native coronary artery without angina pectoris: Secondary | ICD-10-CM | POA: Diagnosis not present

## 2015-06-03 DIAGNOSIS — R55 Syncope and collapse: Secondary | ICD-10-CM | POA: Insufficient documentation

## 2015-06-03 DIAGNOSIS — G510 Bell's palsy: Secondary | ICD-10-CM | POA: Diagnosis not present

## 2015-06-03 DIAGNOSIS — R778 Other specified abnormalities of plasma proteins: Secondary | ICD-10-CM

## 2015-06-03 DIAGNOSIS — I5023 Acute on chronic systolic (congestive) heart failure: Secondary | ICD-10-CM | POA: Diagnosis not present

## 2015-06-03 DIAGNOSIS — E7800 Pure hypercholesterolemia, unspecified: Secondary | ICD-10-CM | POA: Insufficient documentation

## 2015-06-03 DIAGNOSIS — R7989 Other specified abnormal findings of blood chemistry: Principal | ICD-10-CM | POA: Insufficient documentation

## 2015-06-03 DIAGNOSIS — R42 Dizziness and giddiness: Secondary | ICD-10-CM | POA: Diagnosis present

## 2015-06-03 LAB — BRAIN NATRIURETIC PEPTIDE: B Natriuretic Peptide: 1041.3 pg/mL — ABNORMAL HIGH (ref 0.0–100.0)

## 2015-06-03 LAB — BASIC METABOLIC PANEL
Anion gap: 8 (ref 5–15)
BUN: 9 mg/dL (ref 6–20)
CHLORIDE: 108 mmol/L (ref 101–111)
CO2: 26 mmol/L (ref 22–32)
CREATININE: 1.31 mg/dL — AB (ref 0.61–1.24)
Calcium: 9.3 mg/dL (ref 8.9–10.3)
GFR, EST NON AFRICAN AMERICAN: 54 mL/min — AB (ref >60)
Glucose, Bld: 176 mg/dL — ABNORMAL HIGH (ref 65–99)
Potassium: 4.1 mmol/L (ref 3.5–5.1)
SODIUM: 142 mmol/L (ref 135–145)

## 2015-06-03 LAB — CBC
HCT: 37.2 % — ABNORMAL LOW (ref 39.0–52.0)
Hemoglobin: 11.5 g/dL — ABNORMAL LOW (ref 13.0–17.0)
MCH: 31.2 pg (ref 26.0–34.0)
MCHC: 30.9 g/dL (ref 30.0–36.0)
MCV: 100.8 fL — ABNORMAL HIGH (ref 78.0–100.0)
PLATELETS: 269 10*3/uL (ref 150–400)
RBC: 3.69 MIL/uL — AB (ref 4.22–5.81)
RDW: 16.1 % — ABNORMAL HIGH (ref 11.5–15.5)
WBC: 4.9 10*3/uL (ref 4.0–10.5)

## 2015-06-03 LAB — URINALYSIS, ROUTINE W REFLEX MICROSCOPIC
BILIRUBIN URINE: NEGATIVE
GLUCOSE, UA: NEGATIVE mg/dL
HGB URINE DIPSTICK: NEGATIVE
KETONES UR: NEGATIVE mg/dL
Leukocytes, UA: NEGATIVE
Nitrite: NEGATIVE
PH: 7 (ref 5.0–8.0)
Protein, ur: NEGATIVE mg/dL
Specific Gravity, Urine: 1.013 (ref 1.005–1.030)

## 2015-06-03 LAB — HEPATIC FUNCTION PANEL
ALBUMIN: 3.4 g/dL — AB (ref 3.5–5.0)
ALT: 14 U/L — ABNORMAL LOW (ref 17–63)
AST: 23 U/L (ref 15–41)
Alkaline Phosphatase: 135 U/L — ABNORMAL HIGH (ref 38–126)
BILIRUBIN DIRECT: 0.7 mg/dL — AB (ref 0.1–0.5)
Indirect Bilirubin: 0.8 mg/dL (ref 0.3–0.9)
TOTAL PROTEIN: 6.8 g/dL (ref 6.5–8.1)
Total Bilirubin: 1.5 mg/dL — ABNORMAL HIGH (ref 0.3–1.2)

## 2015-06-03 LAB — CBG MONITORING, ED: Glucose-Capillary: 173 mg/dL — ABNORMAL HIGH (ref 65–99)

## 2015-06-03 LAB — I-STAT TROPONIN, ED: Troponin i, poc: 0.58 ng/mL (ref 0.00–0.08)

## 2015-06-03 MED ORDER — HEPARIN SODIUM (PORCINE) 5000 UNIT/ML IJ SOLN
4000.0000 [IU] | Freq: Once | INTRAMUSCULAR | Status: DC
  Filled 2015-06-03: qty 1

## 2015-06-03 MED ORDER — HEPARIN (PORCINE) IN NACL 100-0.45 UNIT/ML-% IJ SOLN
1100.0000 [IU]/h | INTRAMUSCULAR | Status: DC
  Administered 2015-06-03: 950 [IU]/h via INTRAVENOUS
  Administered 2015-06-04: 1100 [IU]/h via INTRAVENOUS
  Filled 2015-06-03 (×3): qty 250

## 2015-06-03 MED ORDER — FUROSEMIDE 10 MG/ML IJ SOLN
40.0000 mg | Freq: Once | INTRAMUSCULAR | Status: AC
  Administered 2015-06-03: 40 mg via INTRAVENOUS
  Filled 2015-06-03: qty 4

## 2015-06-03 MED ORDER — HEPARIN BOLUS VIA INFUSION
4000.0000 [IU] | Freq: Once | INTRAVENOUS | Status: AC
  Administered 2015-06-03: 4000 [IU] via INTRAVENOUS
  Filled 2015-06-03: qty 4000

## 2015-06-03 MED ORDER — ASPIRIN 81 MG PO CHEW
324.0000 mg | CHEWABLE_TABLET | Freq: Once | ORAL | Status: AC
  Administered 2015-06-03: 324 mg via ORAL
  Filled 2015-06-03: qty 4

## 2015-06-03 NOTE — ED Provider Notes (Signed)
CSN: [id number]     Arrival date & time 06/03/15  0917 History   First MD Initiated Contact with Patient 06/03/15 1503     Chief Complaint  Patient presents with  . Leg Swelling  . Dizziness     (Consider location/radiation/quality/duration/timing/severity/associated sxs/prior Treatment) Patient is a 70 y.o. male presenting with general illness.  Illness Location:  BLE Quality:  Edema Severity:  Moderate Onset quality:  Gradual Duration:  1 week Timing:  Constant Progression:  Worsening Chronicity:  Recurrent Context:  History of CHF with EF of 15. Patient is on Lasix but states that he's been off his Lasix for 18 days. Patient also with history of CML on chemotherapy. Relieved by:  Nothing tried. Worsened by:  Nothing. Associated symptoms: loss of consciousness and shortness of breath   Associated symptoms: no abdominal pain, no chest pain, no congestion, no cough, no diarrhea, no ear pain, no fatigue, no fever, no nausea, no rash, no sore throat, no vomiting and no wheezing  Headaches: details below.   Loss of consciousness:    LOC duration: Unknown. The patient reports a brief episode that occurred this morning. He denied any chest pain at that time.   Witnessed: no     Suspicion of head trauma:  No   Past Medical History  Diagnosis Date  . Coronary artery disease   . Hypertension   . Hypercholesterolemia   . Stroke ([place])     No residual limb weakness.  Walks with cane at baseline.   Marland Kitchen CML (chronic myelocytic leukemia) ([place])   . TIA (transient ischemic attack) 05/10/2014  . Bell's palsy   . Leukemia [facility])    Past Surgical History  Procedure Laterality Date  . Back surgery    . Hip arthroplasty Right     orif  . Orif forearm fracture Right    Family History  Problem Relation Age of Onset  . Diabetes Mother   . Hypertension Mother   . Diabetes Father   . Hypertension Father   . Diabetes Brother   . Hypertension Brother   . Diabetes Sister   . Hypertension  Sister   . Diabetes Brother   . Hypertension Brother   . Diabetes Sister   . Hypertension Sister    Social History  Substance Use Topics  . Smoking status: Former Smoker -- 0.50 packs/day for 50 years    Types: Cigarettes  . Smokeless tobacco: Never Used  . Alcohol Use: 0.6 oz/week    1 Cans of beer per week     Comment: daily     Review of Systems  Constitutional: Negative for fever, chills, appetite change and fatigue.  HENT: Negative for congestion, ear pain, facial swelling, mouth sores and sore throat.   Eyes: Negative for visual disturbance.  Respiratory: Positive for shortness of breath. Negative for cough, chest tightness and wheezing.   Cardiovascular: Negative for chest pain and palpitations.  Gastrointestinal: Negative for nausea, vomiting, abdominal pain, diarrhea and blood in stool.  Endocrine: Negative for cold intolerance and heat intolerance.  Genitourinary: Negative for frequency, decreased urine volume and difficulty urinating.  Musculoskeletal: Negative for back pain and neck stiffness.  Skin: Negative for rash.  Neurological: Positive for loss of consciousness. Negative for dizziness, weakness and light-headedness. Headaches: details below.  All other systems reviewed and are negative.     Allergies  Review of patient's allergies indicates no known allergies.  Home Medications   Prior to Admission medications   Medication Sig Start  Date End Date Taking? Authorizing Provider  albuterol (PROVENTIL HFA;VENTOLIN HFA) 108 (90 BASE) MCG/ACT inhaler Inhale 2 puffs into the lungs every 4 (four) hours as needed for wheezing or shortness of breath. 08/08/14   Noemi Chapel, MD  aspirin 81 MG chewable tablet Chew 1 tablet (81 mg total) by mouth daily. 09/22/14   Hosie Poisson, MD  atorvastatin (LIPITOR) 40 MG tablet Take 40 mg by mouth every morning.  03/02/14   Historical Provider, MD  bosutinib (BOSULIF) 100 MG tablet Take 300 mg by mouth daily.  01/31/15    Historical Provider, MD  calcium carbonate (TUMS - DOSED IN MG ELEMENTAL CALCIUM) 500 MG chewable tablet Chew 2 tablets (400 mg of elemental calcium total) by mouth every 6 (six) hours as needed for indigestion or heartburn. Patient not taking: Reported on 10/17/2014 09/22/14   Hosie Poisson, MD  carvedilol (COREG) 6.25 MG tablet Take 1 tablet (6.25 mg total) by mouth 2 (two) times daily with a meal. 01/29/15   Barton Dubois, MD  clopidogrel (PLAVIX) 75 MG tablet Take 1 tablet (75 mg total) by mouth daily. 03/21/14   [name], MD  digoxin (LANOXIN) 0.125 MG tablet Take 1 tablet (0.125 mg total) by mouth daily. 01/29/15   Barton Dubois, MD  furosemide (LASIX) 40 MG tablet Take 1.5 tablets (60 mg total) by mouth daily. 01/29/15   Barton Dubois, MD  isosorbide-hydrALAZINE (BIDIL) 20-37.5 MG tablet Take 0.5 tablets by mouth 2 (two) times daily. 01/29/15   Barton Dubois, MD  losartan (COZAAR) 50 MG tablet Take 1 tablet (50 mg total) by mouth daily. 01/29/15   Barton Dubois, MD  oxyCODONE-acetaminophen (PERCOCET/ROXICET) 5-325 MG tablet Take 1 tablet by mouth every 8 (eight) hours as needed for severe pain. pain 01/29/15   Barton Dubois, MD  pantoprazole (PROTONIX) 40 MG tablet Take 1 tablet (40 mg total) by mouth daily. 01/29/15   Barton Dubois, MD  polyethylene glycol [facility] / GLYCOLAX) packet Take 17 g by mouth daily. Patient taking differently: Take 17 g by mouth daily as needed for moderate constipation.  09/22/14   Hosie Poisson, MD  potassium chloride SA (K-DUR,KLOR-CON) 20 MEQ tablet Take 1 tablet (20 mEq total) by mouth 2 (two) times daily. Patient not taking: Reported on 10/31/2014 07/31/14   Truitt Merle, MD  spironolactone (ALDACTONE) 25 MG tablet Take 1 tablet (25 mg total) by mouth daily. 01/29/15   Barton Dubois, MD  traMADol (ULTRAM) 50 MG tablet Take 1 tablet (50 mg total) by mouth every 6 (six) hours as needed. 02/20/15   Veryl Speak, MD   BP 131/85 mmHg  Pulse 85  Temp(Src) 97.5 F (36.4  C) (Oral)  Resp 18  SpO2 98% Physical Exam  Constitutional: He is oriented to person, place, and time. He appears well-nourished. No distress.  HENT:  Head: Normocephalic and atraumatic.  Right Ear: External ear normal.  Left Ear: External ear normal.  Eyes: Pupils are equal, round, and reactive to light. Right eye exhibits no discharge. Left eye exhibits no discharge. No scleral icterus.  Neck: Normal range of motion. Neck supple. JVD present.  Cardiovascular: Normal rate.  Exam reveals no gallop and no friction rub.   No murmur heard. Pulmonary/Chest: Effort normal and breath sounds normal. No stridor. No respiratory distress. He has no wheezes. He has no rales. He exhibits no tenderness.  Abdominal: Soft. He exhibits distension. He exhibits no mass. There is no tenderness. There is no rebound and no guarding.  Musculoskeletal: He exhibits no  edema or tenderness.  2+ bilateral pitting edema.  Neurological: He is alert and oriented to person, place, and time.  Skin: Skin is warm and dry. No rash noted. He is not diaphoretic. No erythema.    ED Course  Procedures (including critical care time) Labs Review Labs Reviewed  BASIC METABOLIC PANEL - Abnormal; Notable for the following:    Glucose, Bld 176 (*)    Creatinine, Ser 1.31 (*)    GFR calc non Af Amer 54 (*)    All other components within normal limits  CBC - Abnormal; Notable for the following:    RBC 3.69 (*)    Hemoglobin 11.5 (*)    HCT 37.2 (*)    MCV 100.8 (*)    RDW 16.1 (*)    All other components within normal limits  CBG MONITORING, ED - Abnormal; Notable for the following:    Glucose-Capillary 173 (*)    All other components within normal limits  URINALYSIS, ROUTINE W REFLEX MICROSCOPIC (NOT AT [facility])    Imaging Review No results found. I have personally reviewed and evaluated these images and lab results as part of my medical decision-making.   EKG Interpretation   Date/Time:  Tuesday June 03 2015  10:35:15 EST Ventricular Rate:  88 PR Interval:  160 QRS Duration: 88 QT Interval:  408 QTC Calculation: 493 R Axis:   -24 Text Interpretation:  Normal sinus rhythm Nonspecific ST and T wave  abnormality Prolonged QT Abnormal ECG similar to 02/20/15  Confirmed by  [facility] MD, Corene Cornea [phone]) on 06/03/2015 3:02:58 PM      MDM   70 year old male with a extensive past medical history significant for CML on chemotherapy, CHF with an EF of 15% on Lasix was has been noncompliant who presents with a syncopal episode that occurred this morning and one week worth of lower extremity edema with associated shortness of breath.. Rest of the history as above.  On arrival patient is afebrile with stable vital signs. EKG with no significant changes from prior. Chest x-ray with no evidence of pulmonary edema or pneumonia. Workup was consistent with CHF exacerbation. Additionally patient's troponin elevated at 0.58. Repeat EKG similar to prior without acute ischemia.  This could be related to CHF exacerbation however given the rapid increase in the troponin from his initial one obtained on arrival there is a concern that there may be ACS component. Patient was given aspirin and started on a heparin drip. Patient was also given IV Lasix for diuresing.  Dr. Fredda Hammed was consulted for admission and continued management.   Patient was seen in conjunction with Dr. Dolly Rias.   Diagnosis studies were interpreted by me and use to my clinical decision-making.  Final diagnoses:  Acute on chronic systolic (congestive) heart failure ([place])  Elevated troponin        Addison Lank, MD 06/04/15 0002  Merrily Pew, MD 06/05/15 1141

## 2015-06-03 NOTE — H&P (Signed)
Bruce [name] is an 70 y.o. male.   Chief Complaint: Progressive abdominal and leg swelling associated with mild shortness of breath HPI: Patient is 70 year old male with past medical history significant for nonischemic dilated cardiomyopathy history of recurrent congestive heart failure secondary to severe lead depressed LV systolic function EF of 63-01%, mild coronary artery disease, hypertension, chronic myeloid leukemia, hyperlipidemia, history of CVA 2, history of EtOH abuse, tobacco abuse, anemia of chronic disease, came to the ER complaining of progressive abdominal and leg swelling associated with mild shortness of breath for last 1 week associated with dizziness and near syncopal episode earlier this morning. Patient denies any chest pain nausea vomiting diaphoresis states has been noncompliant to his medication. Patient gives history of PND orthopnea. Denies any fever chills. Denies any dietary indiscretion. Patient was noted to be in mild congestive heart failure with volume overloaded and minimally elevated troponin I patient had similar presentation approximately 4 months ago.  Past Medical History  Diagnosis Date  . Coronary artery disease   . Hypertension   . Hypercholesterolemia   . Stroke ([place])     No residual limb weakness.  Walks with cane at baseline.   Marland Kitchen CML (chronic myelocytic leukemia) ([place])   . TIA (transient ischemic attack) 05/10/2014  . Bell's palsy   . Leukemia [facility])     Past Surgical History  Procedure Laterality Date  . Back surgery    . Hip arthroplasty Right     orif  . Orif forearm fracture Right     Family History  Problem Relation Age of Onset  . Diabetes Mother   . Hypertension Mother   . Diabetes Father   . Hypertension Father   . Diabetes Brother   . Hypertension Brother   . Diabetes Sister   . Hypertension Sister   . Diabetes Brother   . Hypertension Brother   . Diabetes Sister   . Hypertension Sister    Social History:  reports that  he has quit smoking. His smoking use included Cigarettes. He has a 25 pack-year smoking history. He has never used smokeless tobacco. He reports that he drinks about 0.6 oz of alcohol per week. He reports that he does not use illicit drugs.  Allergies: No Known Allergies   (Not in a hospital admission)  Results for orders placed or performed during the hospital encounter of 06/03/15 (from the past 48 hour(s))  CBG monitoring, ED     Status: Abnormal   Collection Time: 06/03/15 10:42 AM  Result Value Ref Range   Glucose-Capillary 173 (H) 65 - 99 mg/dL   Comment 1 Notify RN    Comment 2 Document in Chart   Urinalysis, Routine w reflex microscopic (not at [facility])     Status: None   Collection Time: 06/03/15 10:43 AM  Result Value Ref Range   Color, Urine YELLOW YELLOW   APPearance CLEAR CLEAR   Specific Gravity, Urine 1.013 1.005 - 1.030   pH 7.0 5.0 - 8.0   Glucose, UA NEGATIVE NEGATIVE mg/dL   Hgb urine dipstick NEGATIVE NEGATIVE   Bilirubin Urine NEGATIVE NEGATIVE   Ketones, ur NEGATIVE NEGATIVE mg/dL   Protein, ur NEGATIVE NEGATIVE mg/dL   Nitrite NEGATIVE NEGATIVE   Leukocytes, UA NEGATIVE NEGATIVE    Comment: MICROSCOPIC NOT DONE ON URINES WITH NEGATIVE PROTEIN, BLOOD, LEUKOCYTES, NITRITE, OR GLUCOSE <1000 mg/dL.  Basic metabolic panel     Status: Abnormal   Collection Time: 06/03/15 10:48 AM  Result Value Ref Range  Sodium 142 135 - 145 mmol/L   Potassium 4.1 3.5 - 5.1 mmol/L   Chloride 108 101 - 111 mmol/L   CO2 26 22 - 32 mmol/L   Glucose, Bld 176 (H) 65 - 99 mg/dL   BUN 9 6 - 20 mg/dL   Creatinine, Ser 1.31 (H) 0.61 - 1.24 mg/dL   Calcium 9.3 8.9 - 10.3 mg/dL   GFR calc non Af Amer 54 (L) >60 mL/min   GFR calc Af Amer >60 >60 mL/min    Comment: (NOTE) The eGFR has been calculated using the CKD EPI equation. This calculation has not been validated in all clinical situations. eGFR's persistently <60 mL/min signify possible Chronic Kidney Disease.    Anion gap 8 5 -  15  CBC     Status: Abnormal   Collection Time: 06/03/15 10:48 AM  Result Value Ref Range   WBC 4.9 4.0 - 10.5 K/uL   RBC 3.69 (L) 4.22 - 5.81 MIL/uL   Hemoglobin 11.5 (L) 13.0 - 17.0 g/dL   HCT 37.2 (L) 39.0 - 52.0 %   MCV 100.8 (H) 78.0 - 100.0 fL   MCH 31.2 26.0 - 34.0 pg   MCHC 30.9 30.0 - 36.0 g/dL   RDW 16.1 (H) 11.5 - 15.5 %   Platelets 269 150 - 400 K/uL  Brain natriuretic peptide     Status: Abnormal   Collection Time: 06/03/15  5:40 PM  Result Value Ref Range   B Natriuretic Peptide 1041.3 (H) 0.0 - 100.0 pg/mL  Hepatic function panel     Status: Abnormal   Collection Time: 06/03/15  5:40 PM  Result Value Ref Range   Total Protein 6.8 6.5 - 8.1 g/dL   Albumin 3.4 (L) 3.5 - 5.0 g/dL   AST 23 15 - 41 U/L   ALT 14 (L) 17 - 63 U/L   Alkaline Phosphatase 135 (H) 38 - 126 U/L   Total Bilirubin 1.5 (H) 0.3 - 1.2 mg/dL   Bilirubin, Direct 0.7 (H) 0.1 - 0.5 mg/dL   Indirect Bilirubin 0.8 0.3 - 0.9 mg/dL  I-Stat Troponin, ED (not at [facility])     Status: Abnormal   Collection Time: 06/03/15  5:44 PM  Result Value Ref Range   Troponin i, poc 0.58 (HH) 0.00 - 0.08 ng/mL   Comment NOTIFIED PHYSICIAN    Comment 3            Comment: Due to the release kinetics of cTnI, a negative result within the first hours of the onset of symptoms does not rule out myocardial infarction with certainty. If myocardial infarction is still suspected, repeat the test at appropriate intervals.    Dg Chest 2 View  06/03/2015  CLINICAL DATA:  Shortness of breath EXAM: CHEST  2 VIEW COMPARISON:  Chest radiographs 01/25/2015 and 07/22/2014 FINDINGS: Stable mild to moderate cardiac enlargement. Pulmonary vascularity is within normal limits. The lungs are well expanded and clear. The trachea is midline. Thoracic aorta contour within normal limits. Hilar and mediastinal contours are stable and within normal limits. No acute or suspicious osseous abnormality. IMPRESSION: Stable mild to moderate cardiomegaly.  No acute cardiopulmonary disease. Electronically Signed   By: Curlene Dolphin M.D.   On: 06/03/2015 16:17    Review of Systems  Constitutional: Negative for fever and chills.  Eyes: Negative for double vision and photophobia.  Respiratory: Positive for shortness of breath. Negative for cough and hemoptysis.   Cardiovascular: Positive for orthopnea and leg swelling. Negative for chest  pain.  Gastrointestinal: Negative for nausea, vomiting and abdominal pain.  Genitourinary: Negative for dysuria.  Neurological: Positive for dizziness. Negative for headaches.    Blood pressure 131/73, pulse 93, temperature 97.5 F (36.4 C), temperature source Oral, resp. rate 24, height 5' 8"  (1.727 m), weight 75.8 kg (167 lb 1.7 oz), SpO2 91 %. Physical Exam  HENT:  Head: Normocephalic and atraumatic.  Eyes: Conjunctivae are normal. Pupils are equal, round, and reactive to light. Left eye exhibits no discharge.  Neck: Normal range of motion. Neck supple. JVD present.  Cardiovascular: Normal rate and regular rhythm.   Murmur: Soft systolic murmur and S3 gallop noted. Respiratory:  Decreased breath sound at bases with faint rales noted  GI: Soft. Bowel sounds are normal. He exhibits distension. There is tenderness. There is rebound.  Musculoskeletal:  No clubbing cyanosis 2+ edema noted     Assessment/Plan Acute on chronic systolic T compensated congestive heart failure secondary to noncompliance to medication Minimally elevated troponin I secondary to about doubt significant myocardial infarction Nonischemic dilated cardiomyopathy Mild coronary artery disease Hypertension Chronic myeloid leukemia History of CVA 2 in the past History of EtOH abuse Tobacco abuse History of TIA in the past Plan As per orders   [name], MD 06/03/2015, 8:57 PM

## 2015-06-03 NOTE — ED Notes (Signed)
CBG - 173 

## 2015-06-03 NOTE — ED Notes (Addendum)
Pt reports bilateral leg swelling and dizziness for 1 month. Pt reports abdominal swelling as well. Pt ambulatory with cane.

## 2015-06-03 NOTE — ED Notes (Signed)
Patient transported to X-ray 

## 2015-06-03 NOTE — ED Notes (Signed)
Cardiologist MD at bedside

## 2015-06-03 NOTE — Consult Note (Signed)
ANTICOAGULATION CONSULT NOTE - Initial Consult  Pharmacy Consult for heparin Indication: chest pain/ACS  No Known Allergies  Patient Measurements: Height: 5\' 8"  (172.7 cm) Weight: 167 lb 1.7 oz (75.8 kg) IBW/kg (Calculated) : 68.4 Heparin Dosing Weight: 75.8 kg  Vital Signs: Temp: 97.5 F (36.4 C) (02/21 1358) Temp Source: Oral (02/21 1358) BP: 133/91 mmHg (02/21 1630) Pulse Rate: 71 (02/21 1630)  Labs:  Recent Labs  06/03/15 1048  HGB 11.5*  HCT 37.2*  PLT 269  CREATININE 1.31*    Estimated Creatinine Clearance: 51.5 mL/min (by C-G formula based on Cr of 1.31).   Assessment: 70 yo male w/ bilateral leg swelling and dizziness x1 month. Trop elevated to 0.37. Hgb slightly low, plts wnl. No reported anticoag PTA. Pt is prescribed plavix but does not seem to have been compliant per outpatient pharmacy refill record.    Goal of Therapy:  Heparin level 0.3-0.7 units/ml Monitor platelets by anticoagulation protocol: Yes   Plan:  Heparin 4000 units bolus x1 Heparin 950 IV units/hr F/u 6 hr heparin level Monitor daily HL and CBC  Bruce [name] 06/03/2015,6:27 PM

## 2015-06-04 ENCOUNTER — Encounter (HOSPITAL_COMMUNITY): Payer: Self-pay | Admitting: General Practice

## 2015-06-04 LAB — BASIC METABOLIC PANEL WITH GFR
Anion gap: 11 (ref 5–15)
BUN: 9 mg/dL (ref 6–20)
CO2: 23 mmol/L (ref 22–32)
Calcium: 9.3 mg/dL (ref 8.9–10.3)
Chloride: 107 mmol/L (ref 101–111)
Creatinine, Ser: 1.26 mg/dL — ABNORMAL HIGH (ref 0.61–1.24)
GFR calc Af Amer: >60 mL/min
GFR calc non Af Amer: 57 mL/min — ABNORMAL LOW
Glucose, Bld: 98 mg/dL (ref 65–99)
Potassium: 3.9 mmol/L (ref 3.5–5.1)
Sodium: 141 mmol/L (ref 135–145)

## 2015-06-04 LAB — CBC
HEMATOCRIT: 37.2 % — AB (ref 39.0–52.0)
Hemoglobin: 11.5 g/dL — ABNORMAL LOW (ref 13.0–17.0)
MCH: 30.5 pg (ref 26.0–34.0)
MCHC: 30.9 g/dL (ref 30.0–36.0)
MCV: 98.7 fL (ref 78.0–100.0)
Platelets: 274 10*3/uL (ref 150–400)
RBC: 3.77 MIL/uL — AB (ref 4.22–5.81)
RDW: 16 % — AB (ref 11.5–15.5)
WBC: 4.7 10*3/uL (ref 4.0–10.5)

## 2015-06-04 LAB — CBG MONITORING, ED: Glucose-Capillary: 87 mg/dL (ref 65–99)

## 2015-06-04 LAB — GLUCOSE, CAPILLARY
Glucose-Capillary: 112 mg/dL — ABNORMAL HIGH (ref 65–99)
Glucose-Capillary: 140 mg/dL — ABNORMAL HIGH (ref 65–99)
Glucose-Capillary: 127 mg/dL — ABNORMAL HIGH (ref 65–99)

## 2015-06-04 LAB — MAGNESIUM: Magnesium: 1.8 mg/dL (ref 1.7–2.4)

## 2015-06-04 LAB — DIGOXIN LEVEL: Digoxin Level: <0.2 ng/mL — ABNORMAL LOW (ref 0.8–2.0)

## 2015-06-04 LAB — HEPARIN LEVEL (UNFRACTIONATED)
Heparin Unfractionated: 0.23 IU/mL — ABNORMAL LOW (ref 0.30–0.70)
Heparin Unfractionated: 0.36 [IU]/mL (ref 0.30–0.70)

## 2015-06-04 LAB — TROPONIN I
TROPONIN I: 1.14 ng/mL — AB (ref <0.031)
Troponin I: 1.26 ng/mL
Troponin I: 0.98 ng/mL

## 2015-06-04 MED ORDER — POTASSIUM CHLORIDE CRYS ER 20 MEQ PO TBCR
20.0000 meq | EXTENDED_RELEASE_TABLET | Freq: Two times a day (BID) | ORAL | Status: DC
  Administered 2015-06-04 – 2015-06-06 (×5): 20 meq via ORAL
  Filled 2015-06-04 (×5): qty 1

## 2015-06-04 MED ORDER — ASPIRIN EC 81 MG PO TBEC: 81.0000 mg | DELAYED_RELEASE_TABLET | Freq: Every day | ORAL | Status: DC

## 2015-06-04 MED ORDER — POLYETHYLENE GLYCOL 3350 17 G PO PACK: 17.0000 g | PACK | Freq: Every day | ORAL | Status: DC

## 2015-06-04 MED ORDER — OXYCODONE-ACETAMINOPHEN 5-325 MG PO TABS
1.0000 | ORAL_TABLET | Freq: Three times a day (TID) | ORAL | Status: DC | PRN
  Administered 2015-06-04 – 2015-06-06 (×4): 1 via ORAL
  Filled 2015-06-04 (×4): qty 1

## 2015-06-04 MED ORDER — SODIUM CHLORIDE 0.9 % IV SOLN: 250.0000 mL | INTRAVENOUS | Status: DC | PRN

## 2015-06-04 MED ORDER — ISOSORB DINITRATE-HYDRALAZINE 20-37.5 MG PO TABS
0.5000 | ORAL_TABLET | Freq: Two times a day (BID) | ORAL | Status: DC
  Administered 2015-06-04 – 2015-06-06 (×5): 0.5 via ORAL
  Filled 2015-06-04 (×5): qty 1

## 2015-06-04 MED ORDER — ONDANSETRON HCL 4 MG/2ML IJ SOLN: 4.0000 mg | Freq: Four times a day (QID) | INTRAMUSCULAR | Status: DC | PRN

## 2015-06-04 MED ORDER — ACETAMINOPHEN 325 MG PO TABS
650.0000 mg | ORAL_TABLET | ORAL | Status: DC | PRN
Start: 2015-06-04 — End: 2015-06-06
  Administered 2015-06-04: 650 mg via ORAL
  Filled 2015-06-04: qty 2

## 2015-06-04 MED ORDER — CARVEDILOL 3.125 MG PO TABS
3.1250 mg | ORAL_TABLET | Freq: Two times a day (BID) | ORAL | Status: DC
  Administered 2015-06-04 – 2015-06-06 (×5): 3.125 mg via ORAL
  Filled 2015-06-04 (×6): qty 1

## 2015-06-04 MED ORDER — LOSARTAN POTASSIUM 25 MG PO TABS
25.0000 mg | ORAL_TABLET | Freq: Every day | ORAL | Status: DC
Start: 2015-06-04 — End: 2015-06-05
  Administered 2015-06-04 – 2015-06-05 (×2): 25 mg via ORAL
  Filled 2015-06-04 (×2): qty 1

## 2015-06-04 MED ORDER — INSULIN ASPART 100 UNIT/ML ~~LOC~~ SOLN
0.0000 [IU] | Freq: Three times a day (TID) | SUBCUTANEOUS | Status: DC
  Administered 2015-06-05 – 2015-06-06 (×2): 1 [IU] via SUBCUTANEOUS

## 2015-06-04 MED ORDER — PANTOPRAZOLE SODIUM 40 MG PO TBEC
40.0000 mg | DELAYED_RELEASE_TABLET | Freq: Every day | ORAL | Status: DC
  Administered 2015-06-04 – 2015-06-06 (×3): 40 mg via ORAL
  Filled 2015-06-04 (×3): qty 1

## 2015-06-04 MED ORDER — SODIUM CHLORIDE 0.9% FLUSH: 3.0000 mL | INTRAVENOUS | Status: DC | PRN

## 2015-06-04 MED ORDER — SODIUM CHLORIDE 0.9% FLUSH
3.0000 mL | Freq: Two times a day (BID) | INTRAVENOUS | Status: DC
  Administered 2015-06-05 – 2015-06-06 (×2): 3 mL via INTRAVENOUS

## 2015-06-04 MED ORDER — ASPIRIN 81 MG PO CHEW
81.0000 mg | CHEWABLE_TABLET | Freq: Every day | ORAL | Status: DC
  Administered 2015-06-04 – 2015-06-06 (×3): 81 mg via ORAL
  Filled 2015-06-04 (×3): qty 1

## 2015-06-04 MED ORDER — CLOPIDOGREL BISULFATE 75 MG PO TABS
75.0000 mg | ORAL_TABLET | Freq: Every day | ORAL | Status: DC
  Administered 2015-06-04 – 2015-06-06 (×3): 75 mg via ORAL
  Filled 2015-06-04 (×3): qty 1

## 2015-06-04 MED ORDER — LEVALBUTEROL HCL 1.25 MG/0.5ML IN NEBU
1.2500 mg | INHALATION_SOLUTION | Freq: Three times a day (TID) | RESPIRATORY_TRACT | Status: DC
  Administered 2015-06-04 – 2015-06-06 (×7): 1.25 mg via RESPIRATORY_TRACT
  Filled 2015-06-04 (×10): qty 0.5

## 2015-06-04 MED ORDER — DIGOXIN 125 MCG PO TABS
0.1250 mg | ORAL_TABLET | Freq: Every day | ORAL | Status: DC
  Administered 2015-06-04 – 2015-06-06 (×3): 0.125 mg via ORAL
  Filled 2015-06-04 (×3): qty 1

## 2015-06-04 MED ORDER — FUROSEMIDE 10 MG/ML IJ SOLN
40.0000 mg | Freq: Two times a day (BID) | INTRAMUSCULAR | Status: DC
  Administered 2015-06-04 – 2015-06-06 (×5): 40 mg via INTRAVENOUS
  Filled 2015-06-04 (×5): qty 4

## 2015-06-04 NOTE — ED Notes (Signed)
Report called to Lonia Mad, RN

## 2015-06-04 NOTE — Progress Notes (Signed)
Subjective:  Patient denies any chest pain.  States breathing, abdominal pain and leg swelling slightly improved.  Troponin are now trending down  Objective:  Vital Signs in the last 24 hours: Temp:  [97.5 F (36.4 C)-98 F (36.7 C)] 98 F (36.7 C) (02/22 0733) Pulse Rate:  [71-104] 98 (02/22 0800) Resp:  [10-30] 20 (02/22 0733) BP: (116-168)/(70-123) 163/106 mmHg (02/22 0745) SpO2:  [83 %-100 %] 97 % (02/22 1005) Weight:  [75.8 kg (167 lb 1.7 oz)] 75.8 kg (167 lb 1.7 oz) (02/21 1800)  Intake/Output from previous day: 02/21 0701 - 02/22 0700 In: -  Out: [id number] [Urine:1725] Intake/Output from this shift: Total I/O In: -  Out: 1175 [Urine:1175]  Physical Exam: Neck: no adenopathy, no carotid bruit, no JVD and supple, symmetrical, trachea midline Lungs: decreased breath sounds at bases with faint crackles Heart: regular rate and rhythm, S1, S2 normal and soft systolic murmur and S3 gallop noted Abdomen: soft, bowel sounds present, mildly distended, nontender Extremities: no clubbing, cyanosis, 1+ edema noted  Lab Results:  Recent Labs  06/03/15 1048 06/04/15 0621  WBC 4.9 4.7  HGB 11.5* 11.5*  PLT 269 274    Recent Labs  06/03/15 1048 06/04/15 0621  NA 142 141  K 4.1 3.9  CL 108 107  CO2 26 23  GLUCOSE 176* 98  BUN 9 9  CREATININE 1.31* 1.26*    Recent Labs  06/04/15 0251 06/04/15 0759  TROPONINI 1.26* 1.14*   Hepatic Function Panel  Recent Labs  06/03/15 1740  PROT 6.8  ALBUMIN 3.4*  AST 23  ALT 14*  ALKPHOS 135*  BILITOT 1.5*  BILIDIR 0.7*  IBILI 0.8   No results for input(s): CHOL in the last 72 hours. No results for input(s): PROTIME in the last 72 hours.  Imaging: Imaging results have been reviewed and Dg Chest 2 View  06/03/2015  CLINICAL DATA:  Shortness of breath EXAM: CHEST  2 VIEW COMPARISON:  Chest radiographs 01/25/2015 and 07/22/2014 FINDINGS: Stable mild to moderate cardiac enlargement. Pulmonary vascularity is within normal  limits. The lungs are well expanded and clear. The trachea is midline. Thoracic aorta contour within normal limits. Hilar and mediastinal contours are stable and within normal limits. No acute or suspicious osseous abnormality. IMPRESSION: Stable mild to moderate cardiomegaly. No acute cardiopulmonary disease. Electronically Signed   By: Curlene Dolphin M.D.   On: 06/03/2015 16:17    Cardiac Studies:  Assessment/Plan:  Acute on chronic systolic decompensated congestive heart failure secondary to noncompliance to medication Minimally elevated troponin I secondary to about doubt significant myocardial infarction Nonischemic dilated cardiomyopathy Mild coronary artery disease Hypertension Chronic myeloid leukemia Chronic kidney disease stage II History of CVA 2 in the past History of EtOH abuse Tobacco abuse History of TIA in the past Plan Continue present management. Check labs in a.m. Start BiDil as per orders  LOS: 1 day    [name] 06/04/2015, 11:01 AM

## 2015-06-04 NOTE — ED Notes (Signed)
Patient CBG was 87, the Nurse was informed.

## 2015-06-04 NOTE — Progress Notes (Signed)
Pt had 18 beats Vtach.  Pt had ambulated to bathroom and voided.  Pt denies CP, SOB, palpitations.  Only c/o pain in abdomen.  Dr. Terrence Dupont notified.  New orders received to check Mag level in am.  Prn  Percocet ordered also (pt has chronic pain in legs/back).  Will continue to monitor.

## 2015-06-04 NOTE — ED Notes (Signed)
Patient was given gram crackers, and a diabetic tray ordered for lunch.

## 2015-06-04 NOTE — Consult Note (Signed)
ANTICOAGULATION CONSULT NOTE - Follow-up Consult  Pharmacy Consult for heparin Indication: chest pain/ACS  No Known Allergies  Patient Measurements: Height: 5\' 8"  (172.7 cm) Weight: 172 lb 3.2 oz (78.109 kg) IBW/kg (Calculated) : 68.4 Heparin Dosing Weight: 75.8 kg  Vital Signs: Temp: 97.8 F (36.6 C) (02/22 1230) Temp Source: Oral (02/22 1230) BP: 151/88 mmHg (02/22 1230) Pulse Rate: 90 (02/22 1230)  Labs:  Recent Labs  06/03/15 1048 06/04/15 0251 06/04/15 0621 06/04/15 0759 06/04/15 1348  HGB 11.5*  --  11.5*  --   --   HCT 37.2*  --  37.2*  --   --   PLT 269  --  274  --   --   HEPARINUNFRC  --   --  0.23*  --  0.36  CREATININE 1.31*  --  1.26*  --   --   TROPONINI  --  1.26*  --  1.14* 0.98*    Estimated Creatinine Clearance: 53.5 mL/min (by C-G formula based on Cr of 1.26).  Assessment: 70 yo male w/ bilateral leg swelling and dizziness x1 month. Trop elevated to 1.26. Pt on heparin for r/o ACS. Heparin level  is at goal on 1100 units/hr.  Goal of Therapy:  Heparin level 0.3-0.7 units/ml Monitor platelets by anticoagulation protocol: Yes   Plan:  -No heparin changes needed -Heparin level and CBC in am  Hildred Laser, Pharm D 06/04/2015 2:56 PM

## 2015-06-04 NOTE — Consult Note (Signed)
ANTICOAGULATION CONSULT NOTE - Follow-up Consult  Pharmacy Consult for heparin Indication: chest pain/ACS  No Known Allergies  Patient Measurements: Height: 5\' 8"  (172.7 cm) Weight: 167 lb 1.7 oz (75.8 kg) IBW/kg (Calculated) : 68.4 Heparin Dosing Weight: 75.8 kg  Vital Signs: BP: 159/107 mmHg (02/22 0645) Pulse Rate: 90 (02/22 0645)  Labs:  Recent Labs  06/03/15 1048 06/04/15 0251 06/04/15 0621  HGB 11.5*  --  11.5*  HCT 37.2*  --  37.2*  PLT 269  --  274  HEPARINUNFRC  --   --  0.23*  CREATININE 1.31*  --   --   TROPONINI  --  1.26*  --     Estimated Creatinine Clearance: 51.5 mL/min (by C-G formula based on Cr of 1.31).  Assessment: 70 yo male w/ bilateral leg swelling and dizziness x1 month. Trop elevated to 1.26. Pt on heparin for r/o ACS. Heparin level subtherapeutic (0.23) on 950 units/hr. No issues with line or bleeding reported per RN.  Goal of Therapy:  Heparin level 0.3-0.7 units/ml Monitor platelets by anticoagulation protocol: Yes   Plan:  Increase heparin to 1100 IV units/hr F/u 6 hr heparin level  Sherlon Handing, PharmD, BCPS Clinical pharmacist, pager [phone] 06/04/2015,6:55 AM

## 2015-06-05 LAB — CBC
HEMATOCRIT: 34.6 % — AB (ref 39.0–52.0)
HEMOGLOBIN: 10.8 g/dL — AB (ref 13.0–17.0)
MCH: 30.9 pg (ref 26.0–34.0)
MCHC: 31.2 g/dL (ref 30.0–36.0)
MCV: 98.9 fL (ref 78.0–100.0)
Platelets: 245 10*3/uL (ref 150–400)
RBC: 3.5 MIL/uL — ABNORMAL LOW (ref 4.22–5.81)
RDW: 15.9 % — AB (ref 11.5–15.5)
WBC: 4.1 10*3/uL (ref 4.0–10.5)

## 2015-06-05 LAB — BASIC METABOLIC PANEL
Anion gap: 11 (ref 5–15)
BUN: 10 mg/dL (ref 6–20)
CALCIUM: 9 mg/dL (ref 8.9–10.3)
CHLORIDE: 108 mmol/L (ref 101–111)
CO2: 25 mmol/L (ref 22–32)
CREATININE: 1.36 mg/dL — AB (ref 0.61–1.24)
GFR calc Af Amer: 60 mL/min — ABNORMAL LOW (ref >60)
GFR calc non Af Amer: 52 mL/min — ABNORMAL LOW (ref >60)
Glucose, Bld: 95 mg/dL (ref 65–99)
Potassium: 3.6 mmol/L (ref 3.5–5.1)
Sodium: 144 mmol/L (ref 135–145)

## 2015-06-05 LAB — GLUCOSE, CAPILLARY
GLUCOSE-CAPILLARY: 90 mg/dL (ref 65–99)
Glucose-Capillary: 119 mg/dL — ABNORMAL HIGH (ref 65–99)
Glucose-Capillary: 143 mg/dL — ABNORMAL HIGH (ref 65–99)
Glucose-Capillary: 122 mg/dL — ABNORMAL HIGH (ref 65–99)

## 2015-06-05 LAB — MAGNESIUM: Magnesium: 1.7 mg/dL (ref 1.7–2.4)

## 2015-06-05 LAB — HEPARIN LEVEL (UNFRACTIONATED): HEPARIN UNFRACTIONATED: 0.31 [IU]/mL (ref 0.30–0.70)

## 2015-06-05 LAB — HEMOGLOBIN A1C
Hgb A1c MFr Bld: 5.4 % (ref 4.8–5.6)
MEAN PLASMA GLUCOSE: 108 mg/dL

## 2015-06-05 LAB — BRAIN NATRIURETIC PEPTIDE: B Natriuretic Peptide: 1018.6 pg/mL — ABNORMAL HIGH (ref 0.0–100.0)

## 2015-06-05 MED ORDER — HEPARIN SODIUM (PORCINE) 5000 UNIT/ML IJ SOLN
5000.0000 [IU] | Freq: Three times a day (TID) | INTRAMUSCULAR | Status: DC
  Administered 2015-06-05 – 2015-06-06 (×3): 5000 [IU] via SUBCUTANEOUS
  Filled 2015-06-05 (×3): qty 1

## 2015-06-05 MED ORDER — LOSARTAN POTASSIUM 50 MG PO TABS
50.0000 mg | ORAL_TABLET | Freq: Every day | ORAL | Status: DC
  Administered 2015-06-06: 50 mg via ORAL
  Filled 2015-06-05: qty 1

## 2015-06-05 NOTE — Progress Notes (Signed)
UR Completed Alesi Zachery Graves-Bigelow, RN,BSN [phone]  

## 2015-06-05 NOTE — Clinical Social Work Note (Signed)
CSW provided patient with [facility] information and shelter resources in [place] and [facility] per patient's request. [place] signing off.  Liz Beach MSW, [place], [place], [phone]

## 2015-06-05 NOTE — Progress Notes (Signed)
Checked PT ID with name, birth date and MRN to verify for medication

## 2015-06-05 NOTE — Care Management Obs Status (Signed)
[place] NOTIFICATION   Patient Details  Name: Bruce [name] MRN: [id number] Date of Birth: [birth year]   Medicare Observation Status Notification Given:  Yes [facility] Denial for inpatient stay. Treating BLE edema. Physician Advisor agreeable to Observation status and status changed. )    Graves-Bigelow, Ocie Cornfield, RN 06/05/2015, 4:34 PM

## 2015-06-05 NOTE — Progress Notes (Signed)
Nutrition Brief Note  Patient identified on the Malnutrition Screening Tool (MST) Report. Patient with 11% weight loss over the past year, which is not significant for the time frame. Weight has started to increase. Patient with good appetite and good oral intake.  Wt Readings from Last 15 Encounters:  06/05/15 167 lb 14.4 oz (76.159 kg)  02/20/15 167 lb (75.751 kg)  01/29/15 167 lb 8 oz (75.978 kg)  09/27/14 161 lb (73.029 kg)  09/24/14 161 lb (73.029 kg)  09/22/14 156 lb 15.5 oz (71.2 kg)  08/08/14 196 lb (88.905 kg)  08/07/14 196 lb (88.905 kg)  07/31/14 190 lb 1.6 oz (86.229 kg)  06/26/14 187 lb 4.8 oz (84.959 kg)  05/22/14 188 lb 11.2 oz (85.594 kg)  05/10/14 191 lb 12.8 oz (87 kg)  04/22/14 193 lb 3.2 oz (87.635 kg)  03/27/14 191 lb 14.4 oz (87.045 kg)  03/18/14 187 lb 1.6 oz (84.868 kg)    Body mass index is 25.54 kg/(m^2). Patient meets criteria for normal weight based on current BMI.   Current diet order is heart healthy, patient is consuming approximately 100% of meals at this time. Labs and medications reviewed.   No nutrition interventions warranted at this time. If nutrition issues arise, please consult RD.   Molli Barrows, RD, LDN, [place] Pager [phone] After Hours Pager [phone]

## 2015-06-05 NOTE — Care Management Note (Signed)
Case Management Note  Patient Details  Name: Bruce [name] MRN: [id number] Date of Birth: Jan 13, 1946  Subjective/Objective:     Pt admitted for CHF. CM was able to speak with [facility] Liaison today in reference to social needs. Per pt he is living in his car. He was staying at a group home and it was costing $700.00.                Action/Plan: CM did relay information to CSW for shelter resources vs going back to group home. Pt will benefit from [facility], However if continues to live in car unable to set up any home care at this time. CM will continue to monitor.    Expected Discharge Date:                  Expected Discharge Plan:  [place]  In-House Referral:  Clinical Social Work  Discharge planning Services  CM Consult  Post Acute Care Choice:    Choice offered to:     DME Arranged:    DME Agency:     HH Arranged:    [place] Agency:     Status of Service:  In process, will continue to follow  Medicare Important Message Given:    Date Medicare IM Given:    Medicare IM give by:    Date Additional Medicare IM Given:    Additional Medicare Important Message give by:     If discussed at [place] of Stay Meetings, dates discussed:    Additional Comments:  Bethena Roys, RN 06/05/2015, 3:44 PM

## 2015-06-05 NOTE — Progress Notes (Signed)
Subjective:  Patient denies any chest pain or shortness of breath. Denies any palpitations. Had episode of nonsustained VT yesterday asymptomatic. Abdominal and leg swelling slowly improving.  Objective:  Vital Signs in the last 24 hours: Temp:  [97.7 F (36.5 C)-98.3 F (36.8 C)] 97.7 F (36.5 C) (02/23 0428) Pulse Rate:  [75-111] 80 (02/23 0747) Resp:  [14-20] 18 (02/23 0747) BP: (115-155)/(69-97) 141/74 mmHg (02/23 0428) SpO2:  [89 %-100 %] 97 % (02/23 0747) Weight:  [76.159 kg (167 lb 14.4 oz)-78.109 kg (172 lb 3.2 oz)] 76.159 kg (167 lb 14.4 oz) (02/23 0428)  Intake/Output from previous day: 02/22 0701 - 02/23 0700 In: 1718.4 [P.O.:360; I.V.:1358.4] Out: 4525 [Urine:4525] Intake/Output from this shift: Total I/O In: 360 [P.O.:360] Out: -   Physical Exam: Neck: JVD - 8 cm above sternal notch, no adenopathy, no carotid bruit and supple, symmetrical, trachea midline Lungs: Decreased breath sound at bases with faint rales Heart: regular rate and rhythm, S1, S2 normal and Soft systolic murmur and S3 gallop noted Abdomen: soft, non-tender; bowel sounds normal; no masses,  no organomegaly Extremities: No clubbing cyanosis trace edema  noted  Lab Results:  Recent Labs  06/04/15 0621 06/05/15 0441  WBC 4.7 4.1  HGB 11.5* 10.8*  PLT 274 245    Recent Labs  06/04/15 0621 06/05/15 0441  NA 141 144  K 3.9 3.6  CL 107 108  CO2 23 25  GLUCOSE 98 95  BUN 9 10  CREATININE 1.26* 1.36*    Recent Labs  06/04/15 0759 06/04/15 1348  TROPONINI 1.14* 0.98*   Hepatic Function Panel  Recent Labs  06/03/15 1740  PROT 6.8  ALBUMIN 3.4*  AST 23  ALT 14*  ALKPHOS 135*  BILITOT 1.5*  BILIDIR 0.7*  IBILI 0.8   No results for input(s): CHOL in the last 72 hours. No results for input(s): PROTIME in the last 72 hours.  Imaging: Imaging results have been reviewed and Dg Chest 2 View  06/03/2015  CLINICAL DATA:  Shortness of breath EXAM: CHEST  2 VIEW COMPARISON:   Chest radiographs 01/25/2015 and 07/22/2014 FINDINGS: Stable mild to moderate cardiac enlargement. Pulmonary vascularity is within normal limits. The lungs are well expanded and clear. The trachea is midline. Thoracic aorta contour within normal limits. Hilar and mediastinal contours are stable and within normal limits. No acute or suspicious osseous abnormality. IMPRESSION: Stable mild to moderate cardiomegaly. No acute cardiopulmonary disease. Electronically Signed   By: Curlene Dolphin M.D.   On: 06/03/2015 16:17    Cardiac Studies:  Assessment/Plan:  Resolving Acute on chronic systolic decompensated congestive heart failure secondary to noncompliance to medication Minimally elevated troponin I secondary to about doubt significant myocardial infarction Nonischemic dilated cardiomyopathy Mild coronary artery disease Hypertension Chronic myeloid leukemia Chronic kidney disease stage II History of CVA 2 in the past History of EtOH abuse Tobacco abuse History of TIA in the past Plan Increase losartan as per orders DC IV heparin and switched to subcutaneous heparin for DVT prophylaxis   LOS: 2 days    [name] 06/05/2015, 9:00 AM

## 2015-06-06 LAB — GLUCOSE, CAPILLARY
Glucose-Capillary: 83 mg/dL (ref 65–99)
Glucose-Capillary: 145 mg/dL — ABNORMAL HIGH (ref 65–99)

## 2015-06-06 LAB — CBC
HCT: 33.5 % — ABNORMAL LOW (ref 39.0–52.0)
HEMOGLOBIN: 10.9 g/dL — AB (ref 13.0–17.0)
MCH: 32.9 pg (ref 26.0–34.0)
MCHC: 32.5 g/dL (ref 30.0–36.0)
MCV: 101.2 fL — AB (ref 78.0–100.0)
Platelets: 223 10*3/uL (ref 150–400)
RBC: 3.31 MIL/uL — AB (ref 4.22–5.81)
RDW: 16 % — ABNORMAL HIGH (ref 11.5–15.5)
WBC: 3.8 10*3/uL — ABNORMAL LOW (ref 4.0–10.5)

## 2015-06-06 LAB — BASIC METABOLIC PANEL
ANION GAP: 8 (ref 5–15)
BUN: 13 mg/dL (ref 6–20)
CHLORIDE: 106 mmol/L (ref 101–111)
CO2: 25 mmol/L (ref 22–32)
Calcium: 8.8 mg/dL — ABNORMAL LOW (ref 8.9–10.3)
Creatinine, Ser: 1.43 mg/dL — ABNORMAL HIGH (ref 0.61–1.24)
GFR calc non Af Amer: 48 mL/min — ABNORMAL LOW (ref >60)
GFR, EST AFRICAN AMERICAN: 56 mL/min — AB (ref >60)
Glucose, Bld: 162 mg/dL — ABNORMAL HIGH (ref 65–99)
POTASSIUM: 3.9 mmol/L (ref 3.5–5.1)
Sodium: 139 mmol/L (ref 135–145)

## 2015-06-06 MED ORDER — ACETAMINOPHEN 325 MG PO TABS: 650.0000 mg | ORAL_TABLET | ORAL | Status: AC | PRN

## 2015-06-06 MED ORDER — CARVEDILOL 3.125 MG PO TABS: 3.1250 mg | ORAL_TABLET | Freq: Two times a day (BID) | ORAL | Status: DC

## 2015-06-06 NOTE — Discharge Summary (Signed)
Discharge summary dictated on 06/06/2015, dictation number is [phone]

## 2015-06-06 NOTE — Progress Notes (Signed)
Patient being discharged per MD order, All discharge instructions given to patient.

## 2015-06-06 NOTE — Discharge Instructions (Signed)

## 2015-06-07 NOTE — Discharge Summary (Signed)
Bruce [name], Bruce [name] NO.:  [id number]  MEDICAL RECORD NO.:  [id number]  LOCATION:  3W15C                        FACILITY:  [place]  PHYSICIAN:  Mcgwire Dasaro N. Terrence [name], M.D. DATE OF BIRTH:  07-Sep-1945  DATE OF ADMISSION:  06/03/2015 DATE OF DISCHARGE:  06/06/2015                              DISCHARGE SUMMARY   ADMITTING DIAGNOSES: 1. Acute-on-chronic systolic decompensated congestive heart failure     secondary to noncompliance to medication. 2. Minimally elevated troponin I secondary to above, doubt significant     myocardial infarction, nonischemic dilated cardiomyopathy. 3. Mild coronary artery disease. 4. Hypertension. 5. Chronic myeloid leukemia. 6. History of cerebrovascular accident x2 in the past. 7. History of alcohol abuse. 8. Tobacco abuse. 9. History of transient ischemic attack in the past.  DISCHARGE DIAGNOSES: 1. Compensated systolic congestive heart failure. 2. Minimally elevated troponin I secondary to above. 3. Mild coronary artery disease. 4. Severe nonischemic dilated cardiomyopathy. 5. Hypertension. 6. Status post nonsustained ventricular tachycardia, asymptomatic. 7. Chronic myeloid leukemia. 8. History of cerebrovascular accident x2 in the past. 9. History of alcohol abuse. 10.Tobacco abuse. 11.History of transient ischemic attack in the past. 12.Prediabetic.  MEDICATIONS:  Discharge home medications are: 1. Tylenol 650 mg every 4 hours as needed. 2. Albuterol inhaler 2 puffs 4 times daily as needed. 3. Aspirin 81 mg 1 tablet daily. 4. Bosulif 100 mg tablet, 3 tablets daily as before. 5. Tums 2 tablets every 6 hours as before. 6. Clopidogrel 75 mg daily. 7. Digoxin 0.125 mg 1 tablet daily. 8. BiDil half tablet twice daily. 9. Losartan 50 mg 1 tablet daily. 10.Percocet 1 tablet every 8 hours as needed for pain. 11.Protonix 40 mg daily. 12.MiraLax 17 g daily as needed. 13.Potassium chloride 20 mEq twice daily. 14.Carvedilol 3.125  mg twice daily. 15.Lasix 80 mg daily. 16.The patient has been advised to stop spironolactone and tramadol     for now.  DIET:  Low salt, low cholesterol, 1800 calories ADA diet.  The patient has been advised to monitor weight daily.  The patient has been advised to restrict fluid to 1 L per 24 hours.  FOLLOWUP:  Follow up with me in 1 week.  BRIEF HISTORY AND HOSPITAL COURSE:  Mr. Wiesner is a 69 year old male with past medical history significant for nonischemic dilated cardiomyopathy, history of recurrent congestive heart failure secondary to severely depressed LV systolic function, EF [id number], mild coronary artery disease, hypertension, chronic myeloid leukemia, hyperlipidemia, history of CVA x2, history of alcohol abuse, tobacco abuse, anemia of chronic disease, not a candidate for ICD, came to ER complaining of progressive abdominal and leg swelling associated with mild shortness of breath for the last 1 week associated with dizziness and near syncopal episode earlier this morning.  The patient denies any chest pain, nausea, vomiting, or diaphoresis.  States he has been noncompliant to his medications.  The patient gives history of PND, orthopnea.  Denies any fever or chills.  Denies dietary indiscretion, but was noted to be in mild congestive heart failure with volume overloaded with minimally elevated troponin I.  The patient had similar presentation approximately 4 months ago.  PHYSICAL EXAMINATION:  VITAL SIGNS:  Blood pressure was 131/73, pulse  93.  He was afebrile. HEENT:  Conjunctivae were pink. NECK:  Supple.  Positive JVD. LUNGS:  Decreased breath sounds at bases with faint rales. CARDIOVASCULAR:  S1, S2 normal.  There was soft systolic murmur and S3 gallop. ABDOMEN:  Soft.  Bowel sounds are present.  Distended, nontender. EXTREMITIES:  There is no clubbing, cyanosis.  There was 2+ edema noted.  LABORATORY DATA:  Sodium was 142, potassium 4.1, BUN 9, creatinine  1.31. His BNP was 1041.  Troponin I was mildly elevated at 0.58, 1.26, 1.14, 0.98, which is trending down.  His blood sugar was 176.  Repeat fasting blood sugar was 98.  Hemoglobin was 11.5, hematocrit 37.2, white count of 4.7.  Dig level was less than 0.2.  Hemoglobin A1c was 5.4.  BRIEF HOSPITAL COURSE:  The patient was admitted to the telemetry unit, was started on IV Lasix and continued on his home medication with good diuresis and resolution of his abdominal and leg swelling, and improvement in his breathing.  The patient did not have any episodes of anginal chest pain during the hospital stay.  He had cardiac catheterization in the past, which showed mild coronary artery disease. The patient used to stay here in shelter, but he is not happy and wanted to go to either [place] or [facility] to the shelters.  All the information has been given.  The patient refused to go to skilled nursing facility and not willing to pay.  The patient has been advised to comply with his medication and follow up.  Also, the patient advised to follow up with Oncology for his chronic myeloid leukemia.  Heart failure instructions have been given.  The patient will be followed up in my office in 1 week.     Bruce [name], M.D.     MNH/MEDQ  D:  06/06/2015  T:  06/06/2015  Job:  [id number]

## 2015-06-16 ENCOUNTER — Emergency Department (HOSPITAL_COMMUNITY): Payer: Medicare Other

## 2015-06-16 ENCOUNTER — Emergency Department (HOSPITAL_COMMUNITY)
Admission: EM | Admit: 2015-06-16 | Discharge: 2015-06-17 | Disposition: A | Discharge status: Home / Self Care | Payer: Medicare Other | Attending: Emergency Medicine | Admitting: Emergency Medicine

## 2015-06-16 ENCOUNTER — Encounter (HOSPITAL_COMMUNITY): Payer: Self-pay | Admitting: Emergency Medicine

## 2015-06-16 DIAGNOSIS — Z7902 Long term (current) use of antithrombotics/antiplatelets: Secondary | ICD-10-CM | POA: Insufficient documentation

## 2015-06-16 DIAGNOSIS — I10 Essential (primary) hypertension: Secondary | ICD-10-CM | POA: Insufficient documentation

## 2015-06-16 DIAGNOSIS — E7800 Pure hypercholesterolemia, unspecified: Secondary | ICD-10-CM | POA: Insufficient documentation

## 2015-06-16 DIAGNOSIS — I2510 Atherosclerotic heart disease of native coronary artery without angina pectoris: Secondary | ICD-10-CM | POA: Diagnosis not present

## 2015-06-16 DIAGNOSIS — Z8673 Personal history of transient ischemic attack (TIA), and cerebral infarction without residual deficits: Secondary | ICD-10-CM | POA: Insufficient documentation

## 2015-06-16 DIAGNOSIS — R079 Chest pain, unspecified: Secondary | ICD-10-CM | POA: Diagnosis present

## 2015-06-16 DIAGNOSIS — R531 Weakness: Secondary | ICD-10-CM | POA: Diagnosis not present

## 2015-06-16 DIAGNOSIS — Z79899 Other long term (current) drug therapy: Secondary | ICD-10-CM | POA: Insufficient documentation

## 2015-06-16 DIAGNOSIS — R778 Other specified abnormalities of plasma proteins: Secondary | ICD-10-CM

## 2015-06-16 DIAGNOSIS — R7989 Other specified abnormal findings of blood chemistry: Secondary | ICD-10-CM | POA: Diagnosis not present

## 2015-06-16 DIAGNOSIS — Z7982 Long term (current) use of aspirin: Secondary | ICD-10-CM | POA: Diagnosis not present

## 2015-06-16 DIAGNOSIS — I5022 Chronic systolic (congestive) heart failure: Secondary | ICD-10-CM | POA: Diagnosis not present

## 2015-06-16 DIAGNOSIS — Z87891 Personal history of nicotine dependence: Secondary | ICD-10-CM | POA: Diagnosis not present

## 2015-06-16 DIAGNOSIS — R0602 Shortness of breath: Secondary | ICD-10-CM | POA: Diagnosis not present

## 2015-06-16 DIAGNOSIS — R42 Dizziness and giddiness: Secondary | ICD-10-CM | POA: Insufficient documentation

## 2015-06-16 LAB — BASIC METABOLIC PANEL
Anion gap: 12 (ref 5–15)
BUN: 14 mg/dL (ref 6–20)
CHLORIDE: 103 mmol/L (ref 101–111)
CO2: 27 mmol/L (ref 22–32)
CREATININE: 1.37 mg/dL — AB (ref 0.61–1.24)
Calcium: 9.6 mg/dL (ref 8.9–10.3)
GFR calc non Af Amer: 51 mL/min — ABNORMAL LOW (ref >60)
GFR, EST AFRICAN AMERICAN: 59 mL/min — AB (ref >60)
Glucose, Bld: 172 mg/dL — ABNORMAL HIGH (ref 65–99)
POTASSIUM: 4 mmol/L (ref 3.5–5.1)
SODIUM: 142 mmol/L (ref 135–145)

## 2015-06-16 LAB — CBC
HCT: 36.8 % — ABNORMAL LOW (ref 39.0–52.0)
HEMOGLOBIN: 12.1 g/dL — AB (ref 13.0–17.0)
MCH: 32.7 pg (ref 26.0–34.0)
MCHC: 32.9 g/dL (ref 30.0–36.0)
MCV: 99.5 fL (ref 78.0–100.0)
PLATELETS: 243 10*3/uL (ref 150–400)
RBC: 3.7 MIL/uL — AB (ref 4.22–5.81)
RDW: 15.6 % — ABNORMAL HIGH (ref 11.5–15.5)
WBC: 5.3 10*3/uL (ref 4.0–10.5)

## 2015-06-16 LAB — I-STAT TROPONIN, ED: TROPONIN I, POC: 0.09 ng/mL — AB (ref 0.00–0.08)

## 2015-06-16 NOTE — ED Notes (Signed)
Pt denies pain.

## 2015-06-16 NOTE — ED Provider Notes (Signed)
CSN: [id number]     Arrival date & time 06/16/15  2003 History  By signing my name below, I, Bruce [name], attest that this documentation has been prepared under the direction and in the presence of Bruce Hacker, MD. Electronically Signed: Altamease [name], ED Scribe. 06/16/2015. 11:41 PM   Chief Complaint  Patient presents with  . Chest Pain  . Dizziness    The history is provided by the patient. No language interpreter was used.   GRAVIEL [name] is a 70 y.o. male with history of CAD, CHF, HTN, hypercholesteremia, chronic myelocytic leukemia, and stroke who presents to the Emergency Department complaining of off-balance dizziness with onset today. The dizziness is worse with standing. Associated symptoms include sweating, generalized weakness, chills, SOB, mild left-sided chest pain, and LE swelling.  When asked specifically that his chest pain "he states had some since of left the hospital." Denying chest pain right now. States "maybe had some earlier today." Reports compliance with his medications. States "when someone tells me my heart is given out, it scares me." Pt denies abdominal pain, nausea, and vomiting. He states that he has been taking his medicine and followed up with cardiology after his most recent discharge from the hospital on 06/06/15.  Past Medical History  Diagnosis Date  . Coronary artery disease   . Hypertension   . Hypercholesterolemia   . Stroke ([place])     No residual limb weakness.  Walks with cane at baseline.   Marland Kitchen CML (chronic myelocytic leukemia) ([place])   . TIA (transient ischemic attack) 05/10/2014  . Bell's palsy   . Leukemia [facility])    Past Surgical History  Procedure Laterality Date  . Back surgery    . Hip arthroplasty Right     orif  . Orif forearm fracture Right    Family History  Problem Relation Age of Onset  . Diabetes Mother   . Hypertension Mother   . Diabetes Father   . Hypertension Father   . Diabetes Brother   . Hypertension  Brother   . Diabetes Sister   . Hypertension Sister   . Diabetes Brother   . Hypertension Brother   . Diabetes Sister   . Hypertension Sister    Social History  Substance Use Topics  . Smoking status: Former Smoker -- 0.50 packs/day for 50 years    Types: Cigarettes  . Smokeless tobacco: Never Used  . Alcohol Use: 0.6 oz/week    1 Cans of beer per week     Comment: daily     Review of Systems  Constitutional: Positive for chills. Negative for fever.  Respiratory: Positive for shortness of breath.   Cardiovascular: Positive for chest pain and leg swelling.  Gastrointestinal: Negative for nausea, vomiting and abdominal pain.  Neurological: Positive for dizziness and weakness.  All other systems reviewed and are negative.     Allergies  Review of patient's allergies indicates no known allergies.  Home Medications   Prior to Admission medications   Medication Sig Start Date End Date Taking? Authorizing Provider  acetaminophen (TYLENOL) 325 MG tablet Take 2 tablets (650 mg total) by mouth every 4 (four) hours as needed for headache or mild pain. 06/06/15  Yes [name], MD  albuterol (PROVENTIL HFA;VENTOLIN HFA) 108 (90 BASE) MCG/ACT inhaler Inhale 2 puffs into the lungs every 4 (four) hours as needed for wheezing or shortness of breath. 08/08/14  Yes Noemi Chapel, MD  aspirin 81 MG chewable tablet Chew 1 tablet (81 mg  total) by mouth daily. 09/22/14  Yes Hosie Poisson, MD  bosutinib (BOSULIF) 100 MG tablet Take 300 mg by mouth daily.  01/31/15  Yes Historical Provider, MD  carvedilol (COREG) 3.125 MG tablet Take 1 tablet (3.125 mg total) by mouth 2 (two) times daily with a meal. 06/06/15  Yes [name], MD  clopidogrel (PLAVIX) 75 MG tablet Take 1 tablet (75 mg total) by mouth daily. 03/21/14  Yes [name], MD  digoxin (LANOXIN) 0.125 MG tablet Take 1 tablet (0.125 mg total) by mouth daily. 01/29/15  Yes Barton Dubois, MD  furosemide (LASIX) 80 MG tablet Take 80 mg by  mouth daily at 12 noon. 03/27/15  Yes Historical Provider, MD  isosorbide-hydrALAZINE (BIDIL) 20-37.5 MG tablet Take 0.5 tablets by mouth 2 (two) times daily. 01/29/15  Yes Barton Dubois, MD  oxyCODONE-acetaminophen (PERCOCET/ROXICET) 5-325 MG tablet Take 1 tablet by mouth every 8 (eight) hours as needed for severe pain. pain 01/29/15  Yes Barton Dubois, MD  calcium carbonate (TUMS - DOSED IN MG ELEMENTAL CALCIUM) 500 MG chewable tablet Chew 2 tablets (400 mg of elemental calcium total) by mouth every 6 (six) hours as needed for indigestion or heartburn. Patient not taking: Reported on 10/17/2014 09/22/14   Hosie Poisson, MD   BP 121/86 mmHg  Pulse 95  Temp(Src) 98.6 F (37 C) (Oral)  Resp 19  SpO2 98% Physical Exam  Constitutional: He is oriented to person, place, and time. He appears well-developed and well-nourished.  HENT:  Head: Normocephalic and atraumatic.  Eyes: Pupils are equal, round, and reactive to light.  Cardiovascular: Normal rate, regular rhythm and normal heart sounds.   No murmur heard. Pulmonary/Chest: Effort normal and breath sounds normal. No respiratory distress. He has no wheezes. He exhibits no tenderness.  Abdominal: Soft. Bowel sounds are normal. There is no tenderness. There is no rebound.  Musculoskeletal: He exhibits edema.  1+ bilateral lower extremity edema  Neurological: He is alert and oriented to person, place, and time.  Cranial nerves II through XII intact, 5 out of 5 strength in all 4 extremities, no dysmetria to finger-nose-finger  Skin: Skin is warm and dry.  Psychiatric: He has a normal mood and affect.  Nursing note and vitals reviewed.   ED Course  Procedures (including critical care time) DIAGNOSTIC STUDIES: Oxygen Saturation is 98% on RA,  normal by my interpretation.    COORDINATION OF CARE: 11:39 PM Discussed treatment plan which includes lab work, CXR, EKG with pt at bedside and pt agreed to plan.  Labs Review Labs Reviewed  BASIC  METABOLIC PANEL - Abnormal; Notable for the following:    Glucose, Bld 172 (*)    Creatinine, Ser 1.37 (*)    GFR calc non Af Amer 51 (*)    GFR calc Af Amer 59 (*)    All other components within normal limits  CBC - Abnormal; Notable for the following:    RBC 3.70 (*)    Hemoglobin 12.1 (*)    HCT 36.8 (*)    RDW 15.6 (*)    All other components within normal limits  BRAIN NATRIURETIC PEPTIDE - Abnormal; Notable for the following:    B Natriuretic Peptide 1086.9 (*)    All other components within normal limits  TROPONIN I - Abnormal; Notable for the following:    Troponin I 0.23 (*)    All other components within normal limits  I-STAT TROPOININ, ED - Abnormal; Notable for the following:    Troponin i, poc 0.09 (*)  All other components within normal limits    Imaging Review Dg Chest 2 View  06/16/2015  CLINICAL DATA:  Left-sided chest pain. Chest tightness. Congestion. Productive cough. Shortness of breath. EXAM: CHEST  2 VIEW COMPARISON:  06/03/2015 FINDINGS: Borderline heart size with normal pulmonary vascularity. No focal airspace disease or consolidation in the lungs. Vague nodular opacities projected over both mid lungs, similar to previous study, likely representing prominent nipple shadows. No blunting of costophrenic angles. No pneumothorax. Mediastinal contours appear intact. Degenerative changes in the spine and shoulders. IMPRESSION: No active cardiopulmonary disease. Electronically Signed   By: Lucienne Capers M.D.   On: 06/16/2015 21:23   I have personally reviewed and evaluated these images and lab results as part of my medical decision-making.   EKG Interpretation   Date/Time:  Monday June 16 2015 20:10:40 EST Ventricular Rate:  99 PR Interval:  150 QRS Duration: 88 QT Interval:  384 QTC Calculation: 492 R Axis:   -59 Text Interpretation:  Normal sinus rhythm Possible Left atrial enlargement  Left anterior fascicular block Left ventricular hypertrophy  Nonspecific T  wave abnormality Prolonged QT Abnormal ECG inverted T similar to prior.  wanderling baseline. Confirmed by Gerald Leitz ([id number]) on 06/16/2015  8:14:07 PM Also confirmed by HORTON  MD, COURTNEY ([id number])  on 06/17/2015  2:05:42 AM      MDM   Final diagnoses:  Chronic systolic heart failure (HCC)  Elevated troponin    Patient presents with multiple complaints including dizziness, chest pain. Chronically ill-appearing but no acute distress. Recent admission for acute on chronic heart failure. At that time he was noted to have a chronically elevated troponin. Has history of nonischemic cardiomyopathy. He has since followed up with Dr. Terrence Dupont.  While the patient notes lower extremity swelling he does report that it is improved. He feels like his weight is improved as well. He is in no acute distress with mild peripheral edema.  Chest x-ray is clear. BNP is consistent with BNP obtained at patient discharge. Initial troponin 0.09 which was an i-STAT. Repeat troponin in the lab is 0.23. This is down trending from discharge.  Discussed with Dr. Terrence Dupont who does not feel the patient required admission. EKG is unchanged. Patient will follow-up closely with Dr. her wanting as an outpatient. He ambulated without difficulty. He was neurologically intact and no signs of cerebellar dysfunction.  After history, exam, and medical workup I feel the patient has been appropriately medically screened and is safe for discharge home. Pertinent diagnoses were discussed with the patient. Patient was given return precautions.  I personally performed the services described in this documentation, which was scribed in my presence. The recorded information has been reviewed and is accurate.    Bruce Hacker, MD 06/17/15 [id number]

## 2015-06-16 NOTE — ED Notes (Signed)
Patient here with complaint of chest pain, dizziness, lower extremity swelling, shortness of breath, and cough for about 1 week. Was discharged from this hospital about 10 days ago. Currently complaining of dizziness only.

## 2015-06-17 LAB — TROPONIN I: Troponin I: 0.23 ng/mL — ABNORMAL HIGH (ref <0.031)

## 2015-06-17 LAB — BRAIN NATRIURETIC PEPTIDE: B Natriuretic Peptide: 1086.9 pg/mL — ABNORMAL HIGH (ref 0.0–100.0)

## 2015-06-17 NOTE — ED Notes (Signed)
C/o increased dizziness post ambulation.

## 2015-06-17 NOTE — ED Notes (Signed)
New c/o sore throat.

## 2015-06-17 NOTE — Discharge Instructions (Signed)
You were seen today and your workup is at its baseline. You have a chronically elevated troponin. You do not appear to be in acute heart failure but did have a history of chronic heart failure. You need follow-up with Dr. Terrence Dupont as soon as possible.  Heart Failure Heart failure is a condition in which the heart has trouble pumping blood. This means your heart does not pump blood efficiently for your body to work well. In some cases of heart failure, fluid may back up into your lungs or you may have swelling (edema) in your lower legs. Heart failure is usually a long-term (chronic) condition. It is important for you to take good care of yourself and follow your health care provider's treatment plan. CAUSES  Some health conditions can cause heart failure. Those health conditions include:  High blood pressure (hypertension). Hypertension causes the heart muscle to work harder than normal. When pressure in the blood vessels is high, the heart needs to pump (contract) with more force in order to circulate blood throughout the body. High blood pressure eventually causes the heart to become stiff and weak.  Coronary artery disease (CAD). CAD is the buildup of cholesterol and fat (plaque) in the arteries of the heart. The blockage in the arteries deprives the heart muscle of oxygen and blood. This can cause chest pain and may lead to a heart attack. High blood pressure can also contribute to CAD.  Heart attack (myocardial infarction). A heart attack occurs when one or more arteries in the heart become blocked. The loss of oxygen damages the muscle tissue of the heart. When this happens, part of the heart muscle dies. The injured tissue does not contract as well and weakens the heart's ability to pump blood.  Abnormal heart valves. When the heart valves do not open and close properly, it can cause heart failure. This makes the heart muscle pump harder to keep the blood flowing.  Heart muscle disease  (cardiomyopathy or myocarditis). Heart muscle disease is damage to the heart muscle from a variety of causes. These can include drug or alcohol abuse, infections, or unknown reasons. These can increase the risk of heart failure.  Lung disease. Lung disease makes the heart work harder because the lungs do not work properly. This can cause a strain on the heart, leading it to fail.  Diabetes. Diabetes increases the risk of heart failure. High blood sugar contributes to high fat (lipid) levels in the blood. Diabetes can also cause slow damage to tiny blood vessels that carry important nutrients to the heart muscle. When the heart does not get enough oxygen and food, it can cause the heart to become weak and stiff. This leads to a heart that does not contract efficiently.  Other conditions can contribute to heart failure. These include abnormal heart rhythms, thyroid problems, and low blood counts (anemia). Certain unhealthy behaviors can increase the risk of heart failure, including:  Being overweight.  Smoking or chewing tobacco.  Eating foods high in fat and cholesterol.  Abusing illicit drugs or alcohol.  Lacking physical activity. SYMPTOMS  Heart failure symptoms may vary and can be hard to detect. Symptoms may include:  Shortness of breath with activity, such as climbing stairs.  Persistent cough.  Swelling of the feet, ankles, legs, or abdomen.  Unexplained weight gain.  Difficulty breathing when lying flat (orthopnea).  Waking from sleep because of the need to sit up and get more air.  Rapid heartbeat.  Fatigue and loss of energy.  Feeling light-headed, dizzy, or close to fainting.  Loss of appetite.  Nausea.  Increased urination during the night (nocturia). DIAGNOSIS  A diagnosis of heart failure is based on your history, symptoms, physical examination, and diagnostic tests. Diagnostic tests for heart failure may  include:  Echocardiography.  Electrocardiography.  Chest X-ray.  Blood tests.  Exercise stress test.  Cardiac angiography.  Radionuclide scans. TREATMENT  Treatment is aimed at managing the symptoms of heart failure. Medicines, behavioral changes, or surgical intervention may be necessary to treat heart failure.  Medicines to help treat heart failure may include:  Angiotensin-converting enzyme (ACE) inhibitors. This type of medicine blocks the effects of a blood protein called angiotensin-converting enzyme. ACE inhibitors relax (dilate) the blood vessels and help lower blood pressure.  Angiotensin receptor blockers (ARBs). This type of medicine blocks the actions of a blood protein called angiotensin. Angiotensin receptor blockers dilate the blood vessels and help lower blood pressure.  Water pills (diuretics). Diuretics cause the kidneys to remove salt and water from the blood. The extra fluid is removed through urination. This loss of extra fluid lowers the volume of blood the heart pumps.  Beta blockers. These prevent the heart from beating too fast and improve heart muscle strength.  Digitalis. This increases the force of the heartbeat.  Healthy behavior changes include:  Obtaining and maintaining a healthy weight.  Stopping smoking or chewing tobacco.  Eating heart-healthy foods.  Limiting or avoiding alcohol.  Stopping illicit drug use.  Physical activity as directed by your health care provider.  Surgical treatment for heart failure may include:  A procedure to open blocked arteries, repair damaged heart valves, or remove damaged heart muscle tissue.  A pacemaker to improve heart muscle function and control certain abnormal heart rhythms.  An internal cardioverter defibrillator to treat certain serious abnormal heart rhythms.  A left ventricular assist device (LVAD) to assist the pumping ability of the heart. HOME CARE INSTRUCTIONS   Take medicines only  as directed by your health care provider. Medicines are important in reducing the workload of your heart, slowing the progression of heart failure, and improving your symptoms.  Do not stop taking your medicine unless directed by your health care provider.  Do not skip any dose of medicine.  Refill your prescriptions before you run out of medicine. Your medicines are needed every day.  Engage in moderate physical activity if directed by your health care provider. Moderate physical activity can benefit some people. The elderly and people with severe heart failure should consult with a health care provider for physical activity recommendations.  Eat heart-healthy foods. Food choices should be free of trans fat and low in saturated fat, cholesterol, and salt (sodium). Healthy choices include fresh or frozen fruits and vegetables, fish, lean meats, legumes, fat-free or low-fat dairy products, and whole grain or high fiber foods. Talk to a dietitian to learn more about heart-healthy foods.  Limit sodium if directed by your health care provider. Sodium restriction may reduce symptoms of heart failure in some people. Talk to a dietitian to learn more about heart-healthy seasonings.  Use healthy cooking methods. Healthy cooking methods include roasting, grilling, broiling, baking, poaching, steaming, or stir-frying. Talk to a dietitian to learn more about healthy cooking methods.  Limit fluids if directed by your health care provider. Fluid restriction may reduce symptoms of heart failure in some people.  Weigh yourself every day. Daily weights are important in the early recognition of excess fluid. You should weigh yourself every  morning after you urinate and before you eat breakfast. Wear the same amount of clothing each time you weigh yourself. Record your daily weight. Provide your health care provider with your weight record.  Monitor and record your blood pressure if directed by your health care  provider.  Check your pulse if directed by your health care provider.  Lose weight if directed by your health care provider. Weight loss may reduce symptoms of heart failure in some people.  Stop smoking or chewing tobacco. Nicotine makes your heart work harder by causing your blood vessels to constrict. Do not use nicotine gum or patches before talking to your health care provider.  Keep all follow-up visits as directed by your health care provider. This is important.  Limit alcohol intake to no more than 1 drink per day for nonpregnant women and 2 drinks per day for men. One drink equals 12 ounces of beer, 5 ounces of wine, or 1 ounces of hard liquor. Drinking more than that is harmful to your heart. Tell your health care provider if you drink alcohol several times a week. Talk with your health care provider about whether alcohol is safe for you. If your heart has already been damaged by alcohol or you have severe heart failure, drinking alcohol should be stopped completely.  Stop illicit drug use.  Stay up-to-date with immunizations. It is especially important to prevent respiratory infections through current pneumococcal and influenza immunizations.  Manage other health conditions such as hypertension, diabetes, thyroid disease, or abnormal heart rhythms as directed by your health care provider.  Learn to manage stress.  Plan rest periods when fatigued.  Learn strategies to manage high temperatures. If the weather is extremely hot:  Avoid vigorous physical activity.  Use air conditioning or fans or seek a cooler location.  Avoid caffeine and alcohol.  Wear loose-fitting, lightweight, and light-colored clothing.  Learn strategies to manage cold temperatures. If the weather is extremely cold:  Avoid vigorous physical activity.  Layer clothes.  Wear mittens or gloves, a hat, and a scarf when going outside.  Avoid alcohol.  Obtain ongoing education and support as  needed.  Participate in or seek rehabilitation as needed to maintain or improve independence and quality of life. SEEK MEDICAL CARE IF:   You have a rapid weight gain.  You have increasing shortness of breath that is unusual for you.  You are unable to participate in your usual physical activities.  You tire easily.  You cough more than normal, especially with physical activity.  You have any or more swelling in areas such as your hands, feet, ankles, or abdomen.  You are unable to sleep because it is hard to breathe.  You feel like your heart is beating fast (palpitations).  You become dizzy or light-headed upon standing up. SEEK IMMEDIATE MEDICAL CARE IF:   You have difficulty breathing.  There is a change in mental status such as decreased alertness or difficulty with concentration.  You have a pain or discomfort in your chest.  You have an episode of fainting (syncope). MAKE SURE YOU:   Understand these instructions.  Will watch your condition.  Will get help right away if you are not doing well or get worse.   This information is not intended to replace advice given to you by your health care provider. Make sure you discuss any questions you have with your health care provider.   Document Released: 03/29/2005 Document Revised: 08/13/2014 Document Reviewed: 04/28/2012 Elsevier Interactive Patient Education  Education ©2016 Elsevier Inc. ° °

## 2015-06-17 NOTE — ED Notes (Signed)
Pt c/o intermittent dizziness. Goes away when he is resting. More dizziness felt when mobile per patient.

## 2015-06-24 ENCOUNTER — Encounter (HOSPITAL_COMMUNITY): Payer: Self-pay | Admitting: *Deleted

## 2015-06-24 ENCOUNTER — Emergency Department (HOSPITAL_COMMUNITY)
Admission: EM | Admit: 2015-06-24 | Discharge: 2015-06-24 | Disposition: A | Discharge status: Home / Self Care | Payer: Medicare Other | Attending: Emergency Medicine | Admitting: Emergency Medicine

## 2015-06-24 DIAGNOSIS — M25551 Pain in right hip: Secondary | ICD-10-CM | POA: Insufficient documentation

## 2015-06-24 DIAGNOSIS — Z87891 Personal history of nicotine dependence: Secondary | ICD-10-CM | POA: Diagnosis not present

## 2015-06-24 DIAGNOSIS — I2510 Atherosclerotic heart disease of native coronary artery without angina pectoris: Secondary | ICD-10-CM | POA: Diagnosis not present

## 2015-06-24 DIAGNOSIS — Z79899 Other long term (current) drug therapy: Secondary | ICD-10-CM | POA: Diagnosis not present

## 2015-06-24 DIAGNOSIS — Z7982 Long term (current) use of aspirin: Secondary | ICD-10-CM | POA: Diagnosis not present

## 2015-06-24 DIAGNOSIS — Z856 Personal history of leukemia: Secondary | ICD-10-CM | POA: Insufficient documentation

## 2015-06-24 DIAGNOSIS — G8929 Other chronic pain: Secondary | ICD-10-CM | POA: Diagnosis not present

## 2015-06-24 DIAGNOSIS — Z8673 Personal history of transient ischemic attack (TIA), and cerebral infarction without residual deficits: Secondary | ICD-10-CM | POA: Diagnosis not present

## 2015-06-24 DIAGNOSIS — Z8669 Personal history of other diseases of the nervous system and sense organs: Secondary | ICD-10-CM | POA: Insufficient documentation

## 2015-06-24 DIAGNOSIS — Z8639 Personal history of other endocrine, nutritional and metabolic disease: Secondary | ICD-10-CM | POA: Diagnosis not present

## 2015-06-24 DIAGNOSIS — Z7902 Long term (current) use of antithrombotics/antiplatelets: Secondary | ICD-10-CM | POA: Diagnosis not present

## 2015-06-24 DIAGNOSIS — M545 Low back pain: Secondary | ICD-10-CM | POA: Diagnosis not present

## 2015-06-24 DIAGNOSIS — I10 Essential (primary) hypertension: Secondary | ICD-10-CM | POA: Insufficient documentation

## 2015-06-24 MED ORDER — OXYCODONE-ACETAMINOPHEN 5-325 MG PO TABS: 2.0000 | ORAL_TABLET | ORAL | Status: DC | PRN

## 2015-06-24 NOTE — ED Notes (Signed)
Patient here with ongoing right hip pain and lower back pain that is chronic. Denies trauma. Reports that the doctors office would take to long for meds

## 2015-06-24 NOTE — Discharge Instructions (Signed)
Continue taking your home medications as prescribed. I recommend calling your primary care provider's office tomorrow morning regarding refilling your prescription of Percocet for your chronic pain. Please return to the Emergency Department if symptoms worsen or new onset of fever, back pain, numbness, tingling, weakness, leg swelling, loss of control of bowel or bladder, urinary retention, chest pain, shortness of breath, dizziness.

## 2015-06-24 NOTE — ED Provider Notes (Signed)
CSN: [id number]     Arrival date & time 06/24/15  1223 History  By signing my name below, I, Evelene Croon, attest that this documentation has been prepared under the direction and in the presence of non-physician practitioner, Harlene Ramus, PA-C. Electronically Signed: Evelene Croon, Scribe. 06/24/2015. 5:51 PM.    Chief Complaint  Patient presents with  . Back Pain  . Hip Pain    The history is provided by the patient. No language interpreter was used.    HPI Comments:  Bruce [name] is a 70 y.o. male with a history of  multiple back and right hip surgeries, who presents to the Emergency Department complaining of chronic, throbbing, right hip pain. He notes his pain radiates down his RLE. Pt states he usually takes Percocet 5mg  for his pain but ran out; last dose was a few days ago. Denies taking any other medications at home. Patient reports his right hip pain today is consistent with his chronic pain. Pt denies recent fall/injury. He is unable to get into to see PCP or Cardiologist for pain meds until next week. No alleviating factors noted. Pt has no other complaints or symptoms at this time. Pt denies fever, numbness, tingling, saddle anesthesia, abdominal pain, vomiting, diarrhea, urinary retention, loss of bowel or bladder, weakness, IVDU, cancer or recent spinal manipulation.   Past Medical History  Diagnosis Date  . Coronary artery disease   . Hypertension   . Hypercholesterolemia   . Stroke ([place])     No residual limb weakness.  Walks with cane at baseline.   Marland Kitchen CML (chronic myelocytic leukemia) ([place])   . TIA (transient ischemic attack) 05/10/2014  . Bell's palsy   . Leukemia [facility])    Past Surgical History  Procedure Laterality Date  . Back surgery    . Hip arthroplasty Right     orif  . Orif forearm fracture Right    Family History  Problem Relation Age of Onset  . Diabetes Mother   . Hypertension Mother   . Diabetes Father   . Hypertension Father   . Diabetes  Brother   . Hypertension Brother   . Diabetes Sister   . Hypertension Sister   . Diabetes Brother   . Hypertension Brother   . Diabetes Sister   . Hypertension Sister    Social History  Substance Use Topics  . Smoking status: Former Smoker -- 0.50 packs/day for 50 years    Types: Cigarettes  . Smokeless tobacco: Never Used  . Alcohol Use: 0.6 oz/week    1 Cans of beer per week     Comment: daily     Review of Systems  Musculoskeletal: Positive for arthralgias (hip pain).  Neurological: Negative for speech difficulty and headaches.  All other systems reviewed and are negative.  Allergies  Review of patient's allergies indicates no known allergies.  Home Medications   Prior to Admission medications   Medication Sig Start Date End Date Taking? Authorizing Provider  acetaminophen (TYLENOL) 325 MG tablet Take 2 tablets (650 mg total) by mouth every 4 (four) hours as needed for headache or mild pain. 06/06/15  Yes [name], MD  albuterol (PROVENTIL HFA;VENTOLIN HFA) 108 (90 BASE) MCG/ACT inhaler Inhale 2 puffs into the lungs every 4 (four) hours as needed for wheezing or shortness of breath. 08/08/14  Yes Noemi Chapel, MD  ALPRAZolam Duanne Moron) 0.25 MG tablet Take 0.25 mg by mouth 2 (two) times daily. 06/10/15  Yes Historical Provider, MD  aspirin 81  MG chewable tablet Chew 1 tablet (81 mg total) by mouth daily. 09/22/14  Yes Hosie Poisson, MD  BOSULIF 100 MG tablet Take 100 mg by mouth 3 (three) times daily. 05/19/15  Yes Historical Provider, MD  carvedilol (COREG) 3.125 MG tablet Take 1 tablet (3.125 mg total) by mouth 2 (two) times daily with a meal. 06/06/15  Yes [name], MD  clopidogrel (PLAVIX) 75 MG tablet Take 1 tablet (75 mg total) by mouth daily. 03/21/14  Yes [name], MD  digoxin (LANOXIN) 0.125 MG tablet Take 1 tablet (0.125 mg total) by mouth daily. 01/29/15  Yes Barton Dubois, MD  furosemide (LASIX) 80 MG tablet Take 80 mg by mouth daily at 12 noon. 03/27/15  Yes  Historical Provider, MD  isosorbide-hydrALAZINE (BIDIL) 20-37.5 MG tablet Take 0.5 tablets by mouth 2 (two) times daily. 01/29/15  Yes Barton Dubois, MD  losartan (COZAAR) 50 MG tablet Take 50 mg by mouth daily. 06/10/15  Yes Historical Provider, MD  potassium chloride SA (K-DUR,KLOR-CON) 20 MEQ tablet Take 20 mEq by mouth daily. 06/10/15  Yes Historical Provider, MD  oxyCODONE-acetaminophen (PERCOCET/ROXICET) 5-325 MG tablet Take 2 tablets by mouth every 4 (four) hours as needed for severe pain. 06/24/15   Chesley Noon Harvis Mabus, PA-C   BP 141/85 mmHg  Pulse 80  Temp(Src) 98.1 F (36.7 C) (Oral)  Resp 16  SpO2 100% Physical Exam  Constitutional: He is oriented to person, place, and time. He appears well-developed and well-nourished.  HENT:  Head: Normocephalic and atraumatic.  Eyes: Conjunctivae and EOM are normal. Right eye exhibits no discharge. Left eye exhibits no discharge. No scleral icterus.  Neck: Normal range of motion. Neck supple.  Cardiovascular: Normal rate, regular rhythm, normal heart sounds and intact distal pulses.   Pulmonary/Chest: Effort normal and breath sounds normal. No respiratory distress. He has no wheezes. He has no rales. He exhibits no tenderness.  Abdominal: Soft. Bowel sounds are normal. He exhibits no distension and no mass. There is no tenderness. There is no rebound and no guarding.  Musculoskeletal: Normal range of motion. He exhibits no edema.  No midline C, T, or L tenderness. Full range of motion of neck and back. FROM of right hip. Full range of motion of bilateral upper and lower extremities, with 5/5 strength. Sensation intact. 2+ PT pulses. Cap refill <2 seconds. Patient able to stand and ambulate without assistance, no ataxia noted.    Neurological: He is alert and oriented to person, place, and time. He has normal strength and normal reflexes. No sensory deficit. Gait normal.  Skin: Skin is warm and dry.  Nursing note and vitals reviewed.   ED  Course  Procedures   DIAGNOSTIC STUDIES:  Oxygen Saturation is 100% on RA, normal by my interpretation.    COORDINATION OF CARE:  5:48 PM Discussed treatment plan with pt at bedside and pt agreed to plan.  MDM   Final diagnoses:  Chronic right hip pain    Patient presents with right hip pain consistent with his chronic hip pain. Denies any other pain or complaints at this time. History of multiple back and right hip surgeries. Patient reports he ran out of his home Percocet a few days ago and has not been able to follow up with his PCP. Denies any recent fall, trauma, injury. VSS. Exam unremarkable, no midline spinal tenderness, bilateral lower extremities neurovascularly intact, patient able to stand and ambulate without assistance. Patient's pain appears to be consistent with his chronic right hip pain,  I do not feel that any further workup or imaging is warranted at this time. I advised patient that I could not refill his full prescription for Percocet. I will give pt a rx for 5mg  Percocet #3 and advised him to call his PCPs office tomorrow morning to have his prescription filled for his chronic pain.   Evaluation does not show pathology requring ongoing emergent intervention or admission. Pt is hemodynamically stable and mentating appropriately. Discussed findings/results and plan with patient/guardian, who agrees with plan. All questions answered. Return precautions discussed and outpatient follow up given.    I personally performed the services described in this documentation, which was scribed in my presence. The recorded information has been reviewed and is accurate.    Chesley Noon [place], [place] 06/24/15 1808  Pattricia Boss, MD 06/26/15 [phone]

## 2015-06-25 NOTE — Consult Note (Signed)
   [facility] Inpatient Consult   06/25/2015  Bruce [name] Sep 20, 1945 [id number]   Thank you for this consult. This patient is not eligible for [facility] Care Management Services.  Reason:  Not a beneficiary currently attributed to one of the [place].  Membership roster used to verify non- eligible status.   Marthenia Rolling, MSN-Ed, RN,BSN [facility] Liaison [phone]

## 2015-06-26 NOTE — Consult Note (Signed)
   [facility] Inpatient Consult   06/26/2015  Bruce [name] 07-09-1945 [id number]  Thank you for this consult.   This patient is Not eligible for [facility] Care Management Services.   Reason:  Not a beneficiary currently attributed to one of the [place].  Membership roster was used to verify non- eligible status.  For questions, please contact: Natividad Brood, RN BSN [facility] Liaison  [phone] business mobile phone Toll free office [phone]

## 2015-07-09 ENCOUNTER — Encounter (HOSPITAL_COMMUNITY): Payer: Self-pay | Admitting: Emergency Medicine

## 2015-07-09 ENCOUNTER — Emergency Department (HOSPITAL_COMMUNITY): Payer: Medicare Other

## 2015-07-09 ENCOUNTER — Emergency Department (HOSPITAL_COMMUNITY)
Admission: EM | Admit: 2015-07-09 | Discharge: 2015-07-09 | Disposition: A | Discharge status: Home / Self Care | Payer: Medicare Other | Attending: Emergency Medicine | Admitting: Emergency Medicine

## 2015-07-09 DIAGNOSIS — R05 Cough: Secondary | ICD-10-CM | POA: Insufficient documentation

## 2015-07-09 DIAGNOSIS — Z8639 Personal history of other endocrine, nutritional and metabolic disease: Secondary | ICD-10-CM | POA: Diagnosis not present

## 2015-07-09 DIAGNOSIS — I2510 Atherosclerotic heart disease of native coronary artery without angina pectoris: Secondary | ICD-10-CM | POA: Diagnosis not present

## 2015-07-09 DIAGNOSIS — I10 Essential (primary) hypertension: Secondary | ICD-10-CM | POA: Diagnosis not present

## 2015-07-09 DIAGNOSIS — Z7901 Long term (current) use of anticoagulants: Secondary | ICD-10-CM | POA: Diagnosis not present

## 2015-07-09 DIAGNOSIS — Z8669 Personal history of other diseases of the nervous system and sense organs: Secondary | ICD-10-CM | POA: Insufficient documentation

## 2015-07-09 DIAGNOSIS — Z8673 Personal history of transient ischemic attack (TIA), and cerebral infarction without residual deficits: Secondary | ICD-10-CM | POA: Diagnosis not present

## 2015-07-09 DIAGNOSIS — Z859 Personal history of malignant neoplasm, unspecified: Secondary | ICD-10-CM | POA: Insufficient documentation

## 2015-07-09 DIAGNOSIS — R0602 Shortness of breath: Secondary | ICD-10-CM | POA: Diagnosis present

## 2015-07-09 DIAGNOSIS — Z7982 Long term (current) use of aspirin: Secondary | ICD-10-CM | POA: Diagnosis not present

## 2015-07-09 DIAGNOSIS — Z87891 Personal history of nicotine dependence: Secondary | ICD-10-CM | POA: Diagnosis not present

## 2015-07-09 DIAGNOSIS — Z856 Personal history of leukemia: Secondary | ICD-10-CM | POA: Diagnosis not present

## 2015-07-09 DIAGNOSIS — I509 Heart failure, unspecified: Secondary | ICD-10-CM | POA: Diagnosis not present

## 2015-07-09 DIAGNOSIS — Z79899 Other long term (current) drug therapy: Secondary | ICD-10-CM | POA: Insufficient documentation

## 2015-07-09 DIAGNOSIS — R600 Localized edema: Secondary | ICD-10-CM | POA: Diagnosis not present

## 2015-07-09 LAB — CBC
HCT: 39.6 % (ref 39.0–52.0)
Hemoglobin: 12.5 g/dL — ABNORMAL LOW (ref 13.0–17.0)
MCH: 31.3 pg (ref 26.0–34.0)
MCHC: 31.6 g/dL (ref 30.0–36.0)
MCV: 99 fL (ref 78.0–100.0)
PLATELETS: 286 10*3/uL (ref 150–400)
RBC: 4 MIL/uL — ABNORMAL LOW (ref 4.22–5.81)
RDW: 14.9 % (ref 11.5–15.5)
WBC: 5.9 10*3/uL (ref 4.0–10.5)

## 2015-07-09 LAB — BASIC METABOLIC PANEL
Anion gap: 11 (ref 5–15)
BUN: 13 mg/dL (ref 6–20)
CALCIUM: 9.7 mg/dL (ref 8.9–10.3)
CO2: 24 mmol/L (ref 22–32)
Chloride: 106 mmol/L (ref 101–111)
Creatinine, Ser: 1.25 mg/dL — ABNORMAL HIGH (ref 0.61–1.24)
GFR calc Af Amer: >60 mL/min (ref >60)
GFR, EST NON AFRICAN AMERICAN: 57 mL/min — AB (ref >60)
GLUCOSE: 104 mg/dL — AB (ref 65–99)
Potassium: 4.3 mmol/L (ref 3.5–5.1)
SODIUM: 141 mmol/L (ref 135–145)

## 2015-07-09 LAB — I-STAT TROPONIN, ED: TROPONIN I, POC: 0.12 ng/mL — AB (ref 0.00–0.08)

## 2015-07-09 LAB — BRAIN NATRIURETIC PEPTIDE: B Natriuretic Peptide: 871 pg/mL — ABNORMAL HIGH (ref 0.0–100.0)

## 2015-07-09 LAB — MAGNESIUM: Magnesium: 2.2 mg/dL (ref 1.7–2.4)

## 2015-07-09 MED ORDER — FUROSEMIDE 10 MG/ML IJ SOLN
40.0000 mg | Freq: Once | INTRAMUSCULAR | Status: AC
  Administered 2015-07-09: 40 mg via INTRAVENOUS
  Filled 2015-07-09: qty 4

## 2015-07-09 MED ORDER — IOPAMIDOL (ISOVUE-370) INJECTION 76%
100.0000 mL | Freq: Once | INTRAVENOUS | Status: AC | PRN
  Administered 2015-07-09: 100 mL via INTRAVENOUS

## 2015-07-09 MED ORDER — ALBUTEROL SULFATE (2.5 MG/3ML) 0.083% IN NEBU
5.0000 mg | INHALATION_SOLUTION | Freq: Once | RESPIRATORY_TRACT | Status: AC
  Administered 2015-07-09: 5 mg via RESPIRATORY_TRACT
  Filled 2015-07-09: qty 6

## 2015-07-09 NOTE — ED Notes (Signed)
Pt is adament that he does not want blood work checked today and that he's "ok". He reports having increased SOB since yesterday and a worsening cough for the past week. Small amounts of mucus reported from coughing. Pt states he's "here to have his breathing checked, but my doctor told me there's nothing else that can be done. He told me to take my medications and pray. Basically my time is short. I have a weak heart and lukemia." Pt's lungs are clear, denies pain.  Notified Lawyer, [place].

## 2015-07-09 NOTE — ED Notes (Signed)
Pt refused blood draw in triage.

## 2015-07-09 NOTE — Discharge Instructions (Signed)
I spoke with Dr. Terrence Dupont and he will see you tomorrow morning in his office.  Call his office at 8 AM and  tell them that we spoke with him about you.

## 2015-07-09 NOTE — ED Provider Notes (Signed)
CSN: [id number]     Arrival date & time 07/09/15  1249 History   First MD Initiated Contact with Patient 07/09/15 1631     Chief Complaint  Patient presents with  . Shortness of Breath     (Consider location/radiation/quality/duration/timing/severity/associated sxs/prior Treatment) HPI  Patient presents to the emergency department with intermittent shortness of breath that has been ongoing for a couple days.  Patient states that he has actually had shortness breath.  For longer than this, but seems to be worse over the last couple days.  The patient states that he has been coughing.  He states that his primary doctor.  Advised that this will get worse and there is no good solution for his problem is.  He does have end-stage CHF and cancerThe patient denies chest pain,headache,blurred vision, neck pain, fever, cough, weakness, numbness, dizziness, anorexia abdominal pain, nausea, vomiting, diarrhea, rash, back pain, dysuria, hematemesis, bloody stool, near syncope, or syncope.  Patient did note some mild edema in his lower extremities  Past Medical History  Diagnosis Date  . Coronary artery disease   . Hypertension   . Hypercholesterolemia   . Stroke ([place])     No residual limb weakness.  Walks with cane at baseline.   Marland Kitchen CML (chronic myelocytic leukemia) ([place])   . TIA (transient ischemic attack) 05/10/2014  . Bell's palsy   . Leukemia [facility])    Past Surgical History  Procedure Laterality Date  . Back surgery    . Hip arthroplasty Right     orif  . Orif forearm fracture Right    Family History  Problem Relation Age of Onset  . Diabetes Mother   . Hypertension Mother   . Diabetes Father   . Hypertension Father   . Diabetes Brother   . Hypertension Brother   . Diabetes Sister   . Hypertension Sister   . Diabetes Brother   . Hypertension Brother   . Diabetes Sister   . Hypertension Sister    Social History  Substance Use Topics  . Smoking status: Former Smoker -- 0.50  packs/day for 50 years    Types: Cigarettes  . Smokeless tobacco: Never Used  . Alcohol Use: 0.6 oz/week    1 Cans of beer per week     Comment: daily     Review of Systems  All other systems negative except as documented in the HPI. All pertinent positives and negatives as reviewed in the HPI. Allergies  Review of patient's allergies indicates no known allergies.  Home Medications   Prior to Admission medications   Medication Sig Start Date End Date Taking? Authorizing Provider  acetaminophen (TYLENOL) 325 MG tablet Take 2 tablets (650 mg total) by mouth every 4 (four) hours as needed for headache or mild pain. 06/06/15   [name], MD  albuterol (PROVENTIL HFA;VENTOLIN HFA) 108 (90 BASE) MCG/ACT inhaler Inhale 2 puffs into the lungs every 4 (four) hours as needed for wheezing or shortness of breath. 08/08/14   Noemi Chapel, MD  ALPRAZolam Duanne Moron) 0.25 MG tablet Take 0.25 mg by mouth 2 (two) times daily. 06/10/15   Historical Provider, MD  aspirin 81 MG chewable tablet Chew 1 tablet (81 mg total) by mouth daily. 09/22/14   Hosie Poisson, MD  BOSULIF 100 MG tablet Take 100 mg by mouth 3 (three) times daily. 05/19/15   Historical Provider, MD  carvedilol (COREG) 3.125 MG tablet Take 1 tablet (3.125 mg total) by mouth 2 (two) times daily with a meal.  06/06/15   [name], MD  clopidogrel (PLAVIX) 75 MG tablet Take 1 tablet (75 mg total) by mouth daily. 03/21/14   [name], MD  digoxin (LANOXIN) 0.125 MG tablet Take 1 tablet (0.125 mg total) by mouth daily. 01/29/15   Barton Dubois, MD  furosemide (LASIX) 80 MG tablet Take 80 mg by mouth daily at 12 noon. 03/27/15   Historical Provider, MD  isosorbide-hydrALAZINE (BIDIL) 20-37.5 MG tablet Take 0.5 tablets by mouth 2 (two) times daily. 01/29/15   Barton Dubois, MD  losartan (COZAAR) 50 MG tablet Take 50 mg by mouth daily. 06/10/15   Historical Provider, MD  oxyCODONE-acetaminophen (PERCOCET/ROXICET) 5-325 MG tablet Take 2 tablets by  mouth every 4 (four) hours as needed for severe pain. 06/24/15   Nona Dell, PA-C  potassium chloride SA (K-DUR,KLOR-CON) 20 MEQ tablet Take 20 mEq by mouth daily. 06/10/15   Historical Provider, MD   BP 145/101 mmHg  Pulse 116  Temp(Src) 98 F (36.7 C) (Oral)  Resp 35  SpO2 97% Physical Exam  Constitutional: He is oriented to person, place, and time. He appears well-developed and well-nourished. No distress.  HENT:  Head: Normocephalic and atraumatic.  Mouth/Throat: Oropharynx is clear and moist.  Eyes: Pupils are equal, round, and reactive to light.  Neck: Normal range of motion. Neck supple.  Cardiovascular: Normal rate, regular rhythm and normal heart sounds.  Exam reveals no gallop and no friction rub.   No murmur heard. Pulmonary/Chest: Effort normal and breath sounds normal. No respiratory distress. He has no wheezes.  Abdominal: Soft. Bowel sounds are normal. He exhibits no distension. There is no tenderness.  Neurological: He is alert and oriented to person, place, and time. He exhibits normal muscle tone. Coordination normal.  Skin: Skin is warm and dry. No rash noted. No erythema.  Psychiatric: He has a normal mood and affect. His behavior is normal.  Nursing note and vitals reviewed.   ED Course  Procedures (including critical care time) Labs Review Labs Reviewed  BASIC METABOLIC PANEL - Abnormal; Notable for the following:    Glucose, Bld 104 (*)    Creatinine, Ser 1.25 (*)    GFR calc non Af Amer 57 (*)    All other components within normal limits  CBC - Abnormal; Notable for the following:    RBC 4.00 (*)    Hemoglobin 12.5 (*)    All other components within normal limits  I-STAT TROPOININ, ED - Abnormal; Notable for the following:    Troponin i, poc 0.12 (*)    All other components within normal limits  MAGNESIUM  BRAIN NATRIURETIC PEPTIDE    Imaging Review Dg Chest 2 View  07/09/2015  CLINICAL DATA:  Shortness of breath for 3 days. Patient  denies chest pain. History of hypertension, coronary artery disease and stroke. Smoker. EXAM: CHEST  2 VIEW COMPARISON:  Radiographs 06/16/2015 and 06/03/2015. FINDINGS: The heart is enlarged. The pulmonary vascularity is slightly prominent but stable. There are stable prominent nipple shadows bilaterally. No edema, confluent airspace opacity or significant pleural effusion seen. The bones appear unchanged. IMPRESSION: Stable cardiomegaly and chronic vascular congestion. No acute findings demonstrated. Electronically Signed   By: Richardean Sale M.D.   On: 07/09/2015 13:36   Ct Angio Chest Pe W/cm &/or Wo Cm  07/09/2015  CLINICAL DATA:  70 year old male with shortness of breath and tachycardia. EXAM: CT ANGIOGRAPHY CHEST WITH CONTRAST TECHNIQUE: Multidetector CT imaging of the chest was performed using the standard protocol during bolus  administration of intravenous contrast. Multiplanar CT image reconstructions and MIPs were obtained to evaluate the vascular anatomy. CONTRAST:  80 cc Isovue 370 COMPARISON:  Chest radiograph dated 07/09/2015 FINDINGS: The lungs are clear. There is no pleural effusion or pneumothorax. The central airways are patent. There is mild atherosclerotic calcification of the thoracic aorta. No CT evidence of pulmonary embolism. Mild cardiomegaly. No pericardial effusion. There is no hilar or mediastinal adenopathy. The esophagus and the thyroid gland appear grossly unremarkable. There is no axillary adenopathy. The chest wall soft tissues appear unremarkable. There is degenerative changes of the spine. No acute fracture. There is retrograde flow of the contrast from the right atrium into the IVC compatible with a degree of right cardiac dysfunction. Correlation with echocardiogram recommended. Multiple scattered calcific densities noted in the upper abdomen and peripancreatic and portacaval region, likely calcified old granuloma. Nonobstructing bilateral upper pole renal calculi. Review of  the MIP images confirms the above findings. IMPRESSION: No CT evidence of pulmonary embolism. Mild cardiomegaly with a degree of right cardiac dysfunction. Correlation with echocardiogram recommended. Electronically Signed   By: Anner Crete M.D.   On: 07/09/2015 20:38   I have personally reviewed and evaluated these images and lab results as part of my medical decision-making.   EKG Interpretation   Date/Time:  Wednesday July 09 2015 18:05:23 EDT Ventricular Rate:  107 PR Interval:  152 QRS Duration: 99 QT Interval:  373 QTC Calculation: 498 R Axis:   80 Text Interpretation:  Age not entered, assumed to be  70 years old for  purpose of ECG interpretation Sinus tachycardia Probable left atrial  enlargement Probable anteroseptal infarct, old nonspecific T changes ST/T  changes not as prominent as earlier in the day Confirmed by GOLDSTON  MD,  SCOTT [phone]) on 07/09/2015 6:41:54 PM      I spoke with Dr. Terrence Dupont who will follow-up with the patient first thing in the morning in his office.  He advised to give him Lasix IV.  The patient does not want to be admitted to the hospital.  CT scan was performed due to the fact that he was tachycardic with the shortness of breath and it was negative for PE   [facility], PA-C 07/11/15 0201  Sherwood Gambler, MD 07/15/15 1136

## 2015-07-09 NOTE — ED Notes (Addendum)
Pt arrives with multiple complaints. Pt reports SOB, slurred speech, and difficulty walking. Pt reports slurred speech has been going on for 1 week and he was evaluated here previously for the same. Pt alert x4. NAD at this time. Pt also report intermittent CP which is not present at this time.

## 2015-07-09 NOTE — ED Notes (Signed)
Pt given sandwich and drink.

## 2015-07-11 ENCOUNTER — Emergency Department (HOSPITAL_COMMUNITY): Payer: Medicare Other

## 2015-07-11 ENCOUNTER — Emergency Department (HOSPITAL_COMMUNITY)
Admission: EM | Admit: 2015-07-11 | Discharge: 2015-07-11 | Disposition: A | Discharge status: Home / Self Care | Payer: Medicare Other | Attending: Emergency Medicine | Admitting: Emergency Medicine

## 2015-07-11 ENCOUNTER — Encounter (HOSPITAL_COMMUNITY): Payer: Self-pay | Admitting: *Deleted

## 2015-07-11 DIAGNOSIS — R0602 Shortness of breath: Secondary | ICD-10-CM | POA: Diagnosis present

## 2015-07-11 DIAGNOSIS — Z87891 Personal history of nicotine dependence: Secondary | ICD-10-CM | POA: Insufficient documentation

## 2015-07-11 DIAGNOSIS — Z856 Personal history of leukemia: Secondary | ICD-10-CM | POA: Diagnosis not present

## 2015-07-11 DIAGNOSIS — I2510 Atherosclerotic heart disease of native coronary artery without angina pectoris: Secondary | ICD-10-CM | POA: Diagnosis not present

## 2015-07-11 DIAGNOSIS — Z8669 Personal history of other diseases of the nervous system and sense organs: Secondary | ICD-10-CM | POA: Diagnosis not present

## 2015-07-11 DIAGNOSIS — Z8639 Personal history of other endocrine, nutritional and metabolic disease: Secondary | ICD-10-CM | POA: Insufficient documentation

## 2015-07-11 DIAGNOSIS — I10 Essential (primary) hypertension: Secondary | ICD-10-CM | POA: Insufficient documentation

## 2015-07-11 DIAGNOSIS — Z8673 Personal history of transient ischemic attack (TIA), and cerebral infarction without residual deficits: Secondary | ICD-10-CM | POA: Insufficient documentation

## 2015-07-11 DIAGNOSIS — I509 Heart failure, unspecified: Secondary | ICD-10-CM | POA: Diagnosis not present

## 2015-07-11 DIAGNOSIS — Z79899 Other long term (current) drug therapy: Secondary | ICD-10-CM | POA: Diagnosis not present

## 2015-07-11 DIAGNOSIS — Z7901 Long term (current) use of anticoagulants: Secondary | ICD-10-CM | POA: Insufficient documentation

## 2015-07-11 DIAGNOSIS — Z7982 Long term (current) use of aspirin: Secondary | ICD-10-CM | POA: Diagnosis not present

## 2015-07-11 DIAGNOSIS — R531 Weakness: Secondary | ICD-10-CM

## 2015-07-11 LAB — CBC WITH DIFFERENTIAL/PLATELET
BAND NEUTROPHILS: 0 %
BASOS PCT: 1 %
BLASTS: 0 %
Basophils Absolute: 0.1 10*3/uL (ref 0.0–0.1)
EOS ABS: 0.1 10*3/uL (ref 0.0–0.7)
Eosinophils Relative: 1 %
HEMATOCRIT: 37.9 % — AB (ref 39.0–52.0)
HEMOGLOBIN: 12.5 g/dL — AB (ref 13.0–17.0)
LYMPHS PCT: 32 %
Lymphs Abs: 1.9 10*3/uL (ref 0.7–4.0)
MCH: 32.4 pg (ref 26.0–34.0)
MCHC: 33 g/dL (ref 30.0–36.0)
MCV: 98.2 fL (ref 78.0–100.0)
MONOS PCT: 9 %
Metamyelocytes Relative: 0 %
Monocytes Absolute: 0.5 10*3/uL (ref 0.1–1.0)
Myelocytes: 0 %
NEUTROS ABS: 3.3 10*3/uL (ref 1.7–7.7)
NEUTROS PCT: 57 %
NRBC: 0 /100{WBCs}
OTHER: 0 %
Platelets: 328 10*3/uL (ref 150–400)
Promyelocytes Absolute: 0 %
RBC: 3.86 MIL/uL — ABNORMAL LOW (ref 4.22–5.81)
RDW: 15 % (ref 11.5–15.5)
WBC: 5.9 10*3/uL (ref 4.0–10.5)

## 2015-07-11 LAB — TROPONIN I: TROPONIN I: 0.24 ng/mL — AB (ref <0.031)

## 2015-07-11 LAB — COMPREHENSIVE METABOLIC PANEL
ALBUMIN: 3.8 g/dL (ref 3.5–5.0)
ALT: 19 U/L (ref 17–63)
ANION GAP: 12 (ref 5–15)
AST: 33 U/L (ref 15–41)
Alkaline Phosphatase: 151 U/L — ABNORMAL HIGH (ref 38–126)
BILIRUBIN TOTAL: 2.1 mg/dL — AB (ref 0.3–1.2)
BUN: 13 mg/dL (ref 6–20)
CALCIUM: 9.4 mg/dL (ref 8.9–10.3)
CO2: 21 mmol/L — ABNORMAL LOW (ref 22–32)
Chloride: 105 mmol/L (ref 101–111)
Creatinine, Ser: 1.39 mg/dL — ABNORMAL HIGH (ref 0.61–1.24)
GFR, EST AFRICAN AMERICAN: 58 mL/min — AB (ref >60)
GFR, EST NON AFRICAN AMERICAN: 50 mL/min — AB (ref >60)
GLUCOSE: 193 mg/dL — AB (ref 65–99)
POTASSIUM: 3.9 mmol/L (ref 3.5–5.1)
Sodium: 138 mmol/L (ref 135–145)
TOTAL PROTEIN: 7.3 g/dL (ref 6.5–8.1)

## 2015-07-11 LAB — BRAIN NATRIURETIC PEPTIDE: B NATRIURETIC PEPTIDE 5: 1319.1 pg/mL — AB (ref 0.0–100.0)

## 2015-07-11 MED ORDER — ALBUTEROL SULFATE (2.5 MG/3ML) 0.083% IN NEBU
2.5000 mg | INHALATION_SOLUTION | RESPIRATORY_TRACT | Status: DC | PRN
  Administered 2015-07-11: 2.5 mg via RESPIRATORY_TRACT
  Filled 2015-07-11: qty 3

## 2015-07-11 NOTE — ED Notes (Signed)
Patient refusing to have an IV started at this time, stating that "if you are not going to give me any medicine, I dont want that thing in my arm. "

## 2015-07-11 NOTE — Discharge Instructions (Signed)
Heart Failure  Heart failure means your heart has trouble pumping blood. This makes it hard for your body to work well. Heart failure is usually a long-term (chronic) condition. You must take good care of yourself and follow your doctor's treatment plan.  HOME CARE   Take your heart medicine as told by your doctor.    Do not stop taking medicine unless your doctor tells you to.    Do not skip any dose of medicine.    Refill your medicines before they run out.    Take other medicines only as told by your doctor or pharmacist.   Stay active if told by your doctor. The elderly and people with severe heart failure should talk with a doctor about physical activity.   Eat heart-healthy foods. Choose foods that are without trans fat and are low in saturated fat, cholesterol, and salt (sodium). This includes fresh or frozen fruits and vegetables, fish, lean meats, fat-free or low-fat dairy foods, whole grains, and high-fiber foods. Lentils and dried peas and beans (legumes) are also good choices.   Limit salt if told by your doctor.   Cook in a healthy way. Roast, grill, broil, bake, poach, steam, or stir-fry foods.   Limit fluids as told by your doctor.   Weigh yourself every morning. Do this after you pee (urinate) and before you eat breakfast. Write down your weight to give to your doctor.   Take your blood pressure and write it down if your doctor tells you to.   Ask your doctor how to check your pulse. Check your pulse as told.   Lose weight if told by your doctor.   Stop smoking or chewing tobacco. Do not use gum or patches that help you quit without your doctor's approval.   Schedule and go to doctor visits as told.   Nonpregnant women should have no more than 1 drink a day. Men should have no more than 2 drinks a day. Talk to your doctor about drinking alcohol.   Stop illegal drug use.   Stay current with shots (immunizations).   Manage your health conditions as told by your doctor.   Learn to  manage your stress.   Rest when you are tired.   If it is really hot outside:    Avoid intense activities.    Use air conditioning or fans, or get in a cooler place.    Avoid caffeine and alcohol.    Wear loose-fitting, lightweight, and light-colored clothing.   If it is really cold outside:    Avoid intense activities.    Layer your clothing.    Wear mittens or gloves, a hat, and a scarf when going outside.    Avoid alcohol.   Learn about heart failure and get support as needed.   Get help to maintain or improve your quality of life and your ability to care for yourself as needed.  GET HELP IF:    You gain weight quickly.   You are more short of breath than usual.   You cannot do your normal activities.   You tire easily.   You cough more than normal, especially with activity.   You have any or more puffiness (swelling) in areas such as your hands, feet, ankles, or belly (abdomen).   You cannot sleep because it is hard to breathe.   You feel like your heart is beating fast (palpitations).   You get dizzy or light-headed when you stand up.  GET HELP   RIGHT AWAY IF:    You have trouble breathing.   There is a change in mental status, such as becoming less alert or not being able to focus.   You have chest pain or discomfort.   You faint.  MAKE SURE YOU:    Understand these instructions.   Will watch your condition.   Will get help right away if you are not doing well or get worse.     This information is not intended to replace advice given to you by your health care provider. Make sure you discuss any questions you have with your health care provider.     Document Released: 01/06/2008 Document Revised: 04/19/2014 Document Reviewed: 05/15/2012  Elsevier Interactive Patient Education 2016 [employer]

## 2015-07-11 NOTE — ED Notes (Signed)
MD at bedside. 

## 2015-07-11 NOTE — ED Notes (Signed)
Patient reports he is supposed to take lasix but does not take it like he's supposed to because it "makes me pee everywhere." patient educated on importance of taking lasix as directed.

## 2015-07-11 NOTE — ED Notes (Signed)
Pt refusing discharge vitals.

## 2015-07-11 NOTE — ED Provider Notes (Signed)
CSN: [id number]     Arrival date & time 07/11/15  1243 History   First MD Initiated Contact with Patient 07/11/15 1322     Chief Complaint  Patient presents with  . Shortness of Breath  . Weakness      HPI  She presents for evaluation of weakness. He states that he really can't walk very far without feeling weak and tired. He tried to move his apartment yesterday and felt weak and tired. He states that Dr. Elnoria Howard wanting his cardiologist as told and that he has "a bad heart and no do anything about it". Has a known EF of 15 from ischemic myopathy. His [place] is intermittently versus poorly compliant with his outpatient treatment regimen. Stop taking his oral chemotherapy regimen as "like it bothers my stomach". History of chronic myeloid leukemia. No worsening edema. No PND orthopnea. Sleeping well. No chest pain.  Past Medical History  Diagnosis Date  . Coronary artery disease   . Hypertension   . Hypercholesterolemia   . Stroke ([place])     No residual limb weakness.  Walks with cane at baseline.   Marland Kitchen CML (chronic myelocytic leukemia) ([place])   . TIA (transient ischemic attack) 05/10/2014  . Bell's palsy   . Leukemia [facility])    Past Surgical History  Procedure Laterality Date  . Back surgery    . Hip arthroplasty Right     orif  . Orif forearm fracture Right    Family History  Problem Relation Age of Onset  . Diabetes Mother   . Hypertension Mother   . Diabetes Father   . Hypertension Father   . Diabetes Brother   . Hypertension Brother   . Diabetes Sister   . Hypertension Sister   . Diabetes Brother   . Hypertension Brother   . Diabetes Sister   . Hypertension Sister    Social History  Substance Use Topics  . Smoking status: Former Smoker -- 0.50 packs/day for 50 years    Types: Cigarettes  . Smokeless tobacco: Never Used  . Alcohol Use: 0.6 oz/week    1 Cans of beer per week     Comment: daily     Review of Systems  Constitutional: Positive for fatigue. Negative  for fever, chills, diaphoresis and appetite change.  HENT: Negative for mouth sores, sore throat and trouble swallowing.   Eyes: Negative for visual disturbance.  Respiratory: Positive for shortness of breath. Negative for cough, chest tightness and wheezing.   Cardiovascular: Negative for chest pain.  Gastrointestinal: Negative for nausea, vomiting, abdominal pain, diarrhea and abdominal distention.  Endocrine: Negative for polydipsia, polyphagia and polyuria.  Genitourinary: Negative for dysuria, frequency and hematuria.  Musculoskeletal: Negative for gait problem.  Skin: Negative for color change, pallor and rash.  Neurological: Positive for weakness. Negative for dizziness, syncope, light-headedness and headaches.  Hematological: Does not bruise/bleed easily.  Psychiatric/Behavioral: Negative for behavioral problems and confusion.      Allergies  Review of patient's allergies indicates no known allergies.  Home Medications   Prior to Admission medications   Medication Sig Start Date End Date Taking? Authorizing Provider  acetaminophen (TYLENOL) 325 MG tablet Take 2 tablets (650 mg total) by mouth every 4 (four) hours as needed for headache or mild pain. 06/06/15   [name], MD  albuterol (PROVENTIL HFA;VENTOLIN HFA) 108 (90 BASE) MCG/ACT inhaler Inhale 2 puffs into the lungs every 4 (four) hours as needed for wheezing or shortness of breath. 08/08/14   Noemi Chapel,  MD  ALPRAZolam (XANAX) 0.25 MG tablet Take 0.25 mg by mouth 2 (two) times daily. 06/10/15   Historical Provider, MD  aspirin 81 MG chewable tablet Chew 1 tablet (81 mg total) by mouth daily. 09/22/14   Hosie Poisson, MD  BOSULIF 100 MG tablet Take 100 mg by mouth 3 (three) times daily. 05/19/15   Historical Provider, MD  carvedilol (COREG) 3.125 MG tablet Take 1 tablet (3.125 mg total) by mouth 2 (two) times daily with a meal. 06/06/15   [name], MD  clopidogrel (PLAVIX) 75 MG tablet Take 1 tablet (75 mg total) by  mouth daily. 03/21/14   [name], MD  digoxin (LANOXIN) 0.125 MG tablet Take 1 tablet (0.125 mg total) by mouth daily. 01/29/15   Barton Dubois, MD  furosemide (LASIX) 80 MG tablet Take 80 mg by mouth daily at 12 noon. 03/27/15   Historical Provider, MD  isosorbide-hydrALAZINE (BIDIL) 20-37.5 MG tablet Take 0.5 tablets by mouth 2 (two) times daily. 01/29/15   Barton Dubois, MD  losartan (COZAAR) 50 MG tablet Take 50 mg by mouth daily. 06/10/15   Historical Provider, MD  oxyCODONE-acetaminophen (PERCOCET/ROXICET) 5-325 MG tablet Take 2 tablets by mouth every 4 (four) hours as needed for severe pain. 06/24/15   Nona Dell, PA-C  potassium chloride SA (K-DUR,KLOR-CON) 20 MEQ tablet Take 20 mEq by mouth daily. 06/10/15   Historical Provider, MD   BP 125/98 mmHg  Pulse 105  Temp(Src) 98.2 F (36.8 C) (Oral)  Resp 12  Ht 5\' 8"  (1.727 m)  Wt 171 lb (77.565 kg)  BMI 26.01 kg/m2  SpO2 95% Physical Exam  Constitutional: He is oriented to person, place, and time. He appears well-developed and well-nourished. No distress.  HENT:  Head: Normocephalic.  Eyes: Conjunctivae are normal. Pupils are equal, round, and reactive to light. No scleral icterus.  Neck: Normal range of motion. Neck supple. No thyromegaly present.  JVD.  Cardiovascular: Normal rate and regular rhythm.  Exam reveals no gallop and no friction rub.   No murmur heard. Sinus rhythm. No ST or for gallop.  Pulmonary/Chest: Effort normal and breath sounds normal. No respiratory distress. He has no wheezes. He has no rales.  No crackles or rales.  Abdominal: Soft. Bowel sounds are normal. He exhibits no distension. There is no tenderness. There is no rebound.  Musculoskeletal: Normal range of motion.  Neurological: He is alert and oriented to person, place, and time.  Skin: Skin is warm and dry. No rash noted.  No dependent edema  Psychiatric: He has a normal mood and affect. His behavior is normal.    ED Course   Procedures (including critical care time) Labs Review Labs Reviewed  CBC WITH DIFFERENTIAL/PLATELET - Abnormal; Notable for the following:    RBC 3.86 (*)    Hemoglobin 12.5 (*)    HCT 37.9 (*)    All other components within normal limits  COMPREHENSIVE METABOLIC PANEL - Abnormal; Notable for the following:    CO2 21 (*)    Glucose, Bld 193 (*)    Creatinine, Ser 1.39 (*)    Alkaline Phosphatase 151 (*)    Total Bilirubin 2.1 (*)    GFR calc non Af Amer 50 (*)    GFR calc Af Amer 58 (*)    All other components within normal limits  TROPONIN I - Abnormal; Notable for the following:    Troponin I 0.24 (*)    All other components within normal limits  BRAIN NATRIURETIC PEPTIDE -  Abnormal; Notable for the following:    B Natriuretic Peptide 1319.1 (*)    All other components within normal limits    Imaging Review Dg Chest 2 View  07/11/2015  CLINICAL DATA:  Sick.  Cough and shortness of breath for 1 week. EXAM: CHEST  2 VIEW COMPARISON:  07/09/2015 FINDINGS: Cardiomegaly. Lungs are clear. No effusions or edema. No acute bony abnormality. Degenerative spurring in the thoracic spine. IMPRESSION: Cardiomegaly.  No active disease. Electronically Signed   By: Rolm Baptise M.D.   On: 07/11/2015 13:29   Ct Angio Chest Pe W/cm &/or Wo Cm  07/09/2015  CLINICAL DATA:  71 year old male with shortness of breath and tachycardia. EXAM: CT ANGIOGRAPHY CHEST WITH CONTRAST TECHNIQUE: Multidetector CT imaging of the chest was performed using the standard protocol during bolus administration of intravenous contrast. Multiplanar CT image reconstructions and MIPs were obtained to evaluate the vascular anatomy. CONTRAST:  80 cc Isovue 370 COMPARISON:  Chest radiograph dated 07/09/2015 FINDINGS: The lungs are clear. There is no pleural effusion or pneumothorax. The central airways are patent. There is mild atherosclerotic calcification of the thoracic aorta. No CT evidence of pulmonary embolism. Mild  cardiomegaly. No pericardial effusion. There is no hilar or mediastinal adenopathy. The esophagus and the thyroid gland appear grossly unremarkable. There is no axillary adenopathy. The chest wall soft tissues appear unremarkable. There is degenerative changes of the spine. No acute fracture. There is retrograde flow of the contrast from the right atrium into the IVC compatible with a degree of right cardiac dysfunction. Correlation with echocardiogram recommended. Multiple scattered calcific densities noted in the upper abdomen and peripancreatic and portacaval region, likely calcified old granuloma. Nonobstructing bilateral upper pole renal calculi. Review of the MIP images confirms the above findings. IMPRESSION: No CT evidence of pulmonary embolism. Mild cardiomegaly with a degree of right cardiac dysfunction. Correlation with echocardiogram recommended. Electronically Signed   By: Anner Crete M.D.   On: 07/09/2015 20:38   I have personally reviewed and evaluated these images and lab results as part of my medical decision-making.   EKG Interpretation   Date/Time:  Friday July 11 2015 13:05:12 EDT Ventricular Rate:  155 PR Interval:  96 QRS Duration: 94 QT Interval:  314 QTC Calculation: 504 R Axis:   -44 Text Interpretation:  Sinus tachycardia with short PR Left axis deviation  T wave abnormality, consider lateral ischemia Abnormal ECG Confirmed by  Jeneen Rinks  MD, [place] ([id number]) on 07/11/2015 1:22:13 PM      MDM   Final diagnoses:  Weakness  Chronic congestive heart failure, unspecified congestive heart failure type ([place])    Patient's lab values, and physical exam seems stable. As chronic partially compensated congestive heart failure with an EF of 15. He is chronic elevation of troponin. Not having chest pain today. Think he is perfectly appropriate for outpatient treatment continued medicine treatment. Of urge compliance. From his description says like he is intermittently compliant  with his outpatient therapy. He will follow up with his primary care physician or return to the ER any acute changes.    Tanna Furry, MD 07/11/15 1550

## 2015-07-11 NOTE — ED Notes (Signed)
Dr. Jeneen Rinks aware that patient had ekg in triage, advised he would like repeat EKG done since patient's heart rate has come down some.

## 2015-07-11 NOTE — ED Notes (Signed)
Pt states cough, weakness and sob x 3 weeks.  Today each time he walks he feels like he's going to fall.

## 2015-08-16 ENCOUNTER — Emergency Department (HOSPITAL_COMMUNITY)
Admission: EM | Admit: 2015-08-16 | Discharge: 2015-08-16 | Disposition: A | Discharge status: Home / Self Care | Payer: Medicare Other | Attending: Emergency Medicine | Admitting: Emergency Medicine

## 2015-08-16 ENCOUNTER — Encounter (HOSPITAL_COMMUNITY): Payer: Self-pay

## 2015-08-16 DIAGNOSIS — R1033 Periumbilical pain: Secondary | ICD-10-CM | POA: Insufficient documentation

## 2015-08-16 DIAGNOSIS — R05 Cough: Secondary | ICD-10-CM | POA: Diagnosis not present

## 2015-08-16 DIAGNOSIS — Z7902 Long term (current) use of antithrombotics/antiplatelets: Secondary | ICD-10-CM | POA: Diagnosis not present

## 2015-08-16 DIAGNOSIS — Z856 Personal history of leukemia: Secondary | ICD-10-CM | POA: Insufficient documentation

## 2015-08-16 DIAGNOSIS — I2510 Atherosclerotic heart disease of native coronary artery without angina pectoris: Secondary | ICD-10-CM | POA: Insufficient documentation

## 2015-08-16 DIAGNOSIS — R42 Dizziness and giddiness: Secondary | ICD-10-CM | POA: Insufficient documentation

## 2015-08-16 DIAGNOSIS — Z79899 Other long term (current) drug therapy: Secondary | ICD-10-CM | POA: Insufficient documentation

## 2015-08-16 DIAGNOSIS — Z8673 Personal history of transient ischemic attack (TIA), and cerebral infarction without residual deficits: Secondary | ICD-10-CM | POA: Diagnosis not present

## 2015-08-16 DIAGNOSIS — Z7982 Long term (current) use of aspirin: Secondary | ICD-10-CM | POA: Diagnosis not present

## 2015-08-16 DIAGNOSIS — Z8639 Personal history of other endocrine, nutritional and metabolic disease: Secondary | ICD-10-CM | POA: Diagnosis not present

## 2015-08-16 DIAGNOSIS — R109 Unspecified abdominal pain: Secondary | ICD-10-CM | POA: Diagnosis present

## 2015-08-16 DIAGNOSIS — Z87891 Personal history of nicotine dependence: Secondary | ICD-10-CM | POA: Insufficient documentation

## 2015-08-16 DIAGNOSIS — I10 Essential (primary) hypertension: Secondary | ICD-10-CM | POA: Insufficient documentation

## 2015-08-16 DIAGNOSIS — Z8669 Personal history of other diseases of the nervous system and sense organs: Secondary | ICD-10-CM | POA: Insufficient documentation

## 2015-08-16 LAB — I-STAT CHEM 8, ED
BUN: 14 mg/dL (ref 6–20)
CALCIUM ION: 1.1 mmol/L — AB (ref 1.13–1.30)
CREATININE: 1 mg/dL (ref 0.61–1.24)
Chloride: 104 mmol/L (ref 101–111)
GLUCOSE: 105 mg/dL — AB (ref 65–99)
HEMATOCRIT: 44 % (ref 39.0–52.0)
HEMOGLOBIN: 15 g/dL (ref 13.0–17.0)
Potassium: 3.9 mmol/L (ref 3.5–5.1)
SODIUM: 141 mmol/L (ref 135–145)
TCO2: 24 mmol/L (ref 0–100)

## 2015-08-16 LAB — URINE MICROSCOPIC-ADD ON: BACTERIA UA: NONE SEEN

## 2015-08-16 LAB — URINALYSIS, ROUTINE W REFLEX MICROSCOPIC
Glucose, UA: NEGATIVE mg/dL
Hgb urine dipstick: NEGATIVE
KETONES UR: 15 mg/dL — AB
LEUKOCYTES UA: NEGATIVE
NITRITE: NEGATIVE
PROTEIN: 100 mg/dL — AB
Specific Gravity, Urine: 1.024 (ref 1.005–1.030)
pH: 6 (ref 5.0–8.0)

## 2015-08-16 NOTE — ED Provider Notes (Signed)
CSN: [id number]     Arrival date & time 08/16/15  1002 History  By signing my name below, I, Soijett Blue, attest that this documentation has been prepared under the direction and in the presence of Ripley Fraise, MD. Electronically Signed: Soijett Blue, ED Scribe. 08/16/2015. 11:01 AM.   Chief Complaint  Patient presents with  . abdominal pain/hernia       Patient is a 70 y.o. male presenting with abdominal pain and dizziness. The history is provided by the patient. No language interpreter was used.  Abdominal Pain Pain location:  Periumbilical Pain radiates to:  Does not radiate Pain severity:  Moderate Onset quality:  Gradual Timing:  Constant Progression:  Worsening Chronicity:  Chronic Relieved by:  None tried Worsened by:  Nothing tried Ineffective treatments:  None tried Associated symptoms: chest pain (following swallowing a pill) and cough   Associated symptoms: no constipation, no diarrhea, no fever and no vomiting   Dizziness Quality:  Imbalance Severity:  Mild Onset quality:  Gradual Timing:  Intermittent Progression:  Unchanged Chronicity:  Chronic Context: standing up   Relieved by:  None tried Worsened by:  Standing up Ineffective treatments:  None tried Associated symptoms: chest pain (following swallowing a pill)   Associated symptoms: no diarrhea, no vomiting and no weakness     HPI Comments: Bruce [name] is a 70 y.o. male with a medical hx of CML, leukemia, bell's palsy, who presents to the Emergency Department complaining of chronic periumbilical abdominal pain worsening 3-4 days ago. Pt states that the hard spot to his abdomen keeps him up at night. He states that he is having associated symptoms of CP following swallowing a pill and chronic cough. He states that he has not tried any medications for the relief for his symptoms. He denies vomiting, difficulty urinating, diarrhea, constipation, groin pain, testicular pain, and any other symptoms. Pt  notes that he smokes cigarettes.  Pt secondarily complains that he has has chronic dizziness and he thinks that this is due to the TIAs that he has had in the past. Pt denies weakness, numbness, and any other symptoms.   Past Medical History  Diagnosis Date  . Coronary artery disease   . Hypertension   . Hypercholesterolemia   . Stroke ([place])     No residual limb weakness.  Walks with cane at baseline.   Marland Kitchen CML (chronic myelocytic leukemia) ([place])   . TIA (transient ischemic attack) 05/10/2014  . Bell's palsy   . Leukemia [facility])    Past Surgical History  Procedure Laterality Date  . Back surgery    . Hip arthroplasty Right     orif  . Orif forearm fracture Right    Family History  Problem Relation Age of Onset  . Diabetes Mother   . Hypertension Mother   . Diabetes Father   . Hypertension Father   . Diabetes Brother   . Hypertension Brother   . Diabetes Sister   . Hypertension Sister   . Diabetes Brother   . Hypertension Brother   . Diabetes Sister   . Hypertension Sister    Social History  Substance Use Topics  . Smoking status: Former Smoker -- 0.50 packs/day for 50 years    Types: Cigarettes  . Smokeless tobacco: Never Used  . Alcohol Use: 0.6 oz/week    1 Cans of beer per week     Comment: daily     Review of Systems  Constitutional: Negative for fever.  Respiratory: Positive for  cough.   Cardiovascular: Positive for chest pain (following swallowing a pill).  Gastrointestinal: Positive for abdominal pain. Negative for vomiting, diarrhea and constipation.  Genitourinary: Negative for difficulty urinating.  Neurological: Positive for dizziness. Negative for weakness and numbness.  All other systems reviewed and are negative.     Allergies  Review of patient's allergies indicates no known allergies.  Home Medications   Prior to Admission medications   Medication Sig Start Date End Date Taking? Authorizing Provider  acetaminophen (TYLENOL) 325 MG tablet  Take 2 tablets (650 mg total) by mouth every 4 (four) hours as needed for headache or mild pain. 06/06/15   [name], MD  albuterol (PROVENTIL HFA;VENTOLIN HFA) 108 (90 BASE) MCG/ACT inhaler Inhale 2 puffs into the lungs every 4 (four) hours as needed for wheezing or shortness of breath. 08/08/14   Noemi Chapel, MD  ALPRAZolam Duanne Moron) 0.25 MG tablet Take 0.25 mg by mouth 2 (two) times daily. 06/10/15   Historical Provider, MD  aspirin 81 MG chewable tablet Chew 1 tablet (81 mg total) by mouth daily. 09/22/14   Hosie Poisson, MD  BOSULIF 100 MG tablet Take 100 mg by mouth 3 (three) times daily. 05/19/15   Historical Provider, MD  carvedilol (COREG) 3.125 MG tablet Take 1 tablet (3.125 mg total) by mouth 2 (two) times daily with a meal. 06/06/15   [name], MD  clopidogrel (PLAVIX) 75 MG tablet Take 1 tablet (75 mg total) by mouth daily. 03/21/14   [name], MD  digoxin (LANOXIN) 0.125 MG tablet Take 1 tablet (0.125 mg total) by mouth daily. 01/29/15   Barton Dubois, MD  furosemide (LASIX) 80 MG tablet Take 80 mg by mouth daily at 12 noon. 03/27/15   Historical Provider, MD  isosorbide-hydrALAZINE (BIDIL) 20-37.5 MG tablet Take 0.5 tablets by mouth 2 (two) times daily. 01/29/15   Barton Dubois, MD  losartan (COZAAR) 50 MG tablet Take 50 mg by mouth daily. 06/10/15   Historical Provider, MD  oxyCODONE-acetaminophen (PERCOCET/ROXICET) 5-325 MG tablet Take 2 tablets by mouth every 4 (four) hours as needed for severe pain. 06/24/15   Nona Dell, PA-C  potassium chloride SA (K-DUR,KLOR-CON) 20 MEQ tablet Take 20 mEq by mouth daily. 06/10/15   Historical Provider, MD   BP 159/102 mmHg  Pulse 78  Temp(Src) 98.1 F (36.7 C) (Oral)  Resp 20  Ht 5\' 8"  (1.727 m)  Wt 175 lb 6.4 oz (79.561 kg)  BMI 26.68 kg/m2  SpO2 97% Physical Exam CONSTITUTIONAL: Well developed/well nourished HEAD: Normocephalic/atraumatic EYES: EOMI/PERRL. Icterus noted.  ENMT: Mucous membranes moist NECK: supple no  meningeal signs SPINE/BACK:entire spine nontender CV: S1/S2 noted, no murmurs/rubs/gallops noted LUNGS: Lungs are clear to auscultation bilaterally, no apparent distress ABDOMEN: soft, nontender, no rebound or guarding, bowel sounds noted throughout abdomen. Easily reducible periumbilical hernia. No focal tenderness.  GU:no cva tenderness NEURO: Pt is awake/alert/appropriate, moves all extremitiesx4.  No facial droop. No arm or leg drift. No past pointing. No ataxia.  EXTREMITIES: pulses normal/equal, full ROM SKIN: warm, color normal PSYCH: no abnormalities of mood noted, alert and oriented to situation  ED Course  Procedures  DIAGNOSTIC STUDIES: Oxygen Saturation is 97% on RA, nl by my interpretation.    COORDINATION OF CARE: 10:57 AM Discussed treatment plan with pt at bedside which includes labs, UA, EKG, and pt agreed to plan.  Pt with multiple complaints, all of which appear chronic No ataxia No focal weakness Dizziness is chronic For his abd pain, this is chronic  and no signs of incarcerated hernia Will d/c home He slept through most of ER visit   Labs Review Labs Reviewed  URINALYSIS, ROUTINE W REFLEX MICROSCOPIC (NOT AT [facility]) - Abnormal; Notable for the following:    Color, Urine ORANGE (*)    Bilirubin Urine SMALL (*)    Ketones, ur 15 (*)    Protein, ur 100 (*)    All other components within normal limits  URINE MICROSCOPIC-ADD ON - Abnormal; Notable for the following:    Squamous Epithelial / LPF 0-5 (*)    Casts HYALINE CASTS (*)    All other components within normal limits  I-STAT CHEM 8, ED - Abnormal; Notable for the following:    Glucose, Bld 105 (*)    Calcium, Ion 1.10 (*)    All other components within normal limits   I have personally reviewed and evaluated these lab results as part of my medical decision-making.   EKG Interpretation   Date/Time:  Saturday Aug 16 2015 12:06:40 EDT Ventricular Rate:  72 PR Interval:  171 QRS Duration: 99 QT  Interval:  466 QTC Calculation: 510 R Axis:   -11 Text Interpretation:  Sinus rhythm Anteroseptal infarct, old Nonspecific T  abnormalities, lateral leads Artifact Prolonged QT interval No significant  change was found Confirmed by Christy Gentles  MD, Elenore Rota ([id number]) on 08/16/2015  12:25:07 PM      MDM   Final diagnoses:  Abdominal pain, unspecified abdominal location    Nursing notes including past medical history and social history reviewed and considered in documentation Labs/vital reviewed myself and considered during evaluation   I personally performed the services described in this documentation, which was scribed in my presence. The recorded information has been reviewed and is accurate.       Ripley Fraise, MD 08/16/15 1440

## 2015-08-16 NOTE — ED Notes (Signed)
Spoke to lab, urine is about to be received now and results to be seen shortly.

## 2015-08-16 NOTE — Discharge Instructions (Signed)

## 2015-08-16 NOTE — ED Notes (Signed)
Patient here with 1 month of ongoing hernia pain at umbilicus. Soft on assessment but tender on palpation.  No distress

## 2015-08-20 ENCOUNTER — Emergency Department: Payer: Medicare Other

## 2015-08-20 ENCOUNTER — Encounter: Payer: Self-pay | Admitting: *Deleted

## 2015-08-20 ENCOUNTER — Emergency Department
Admission: EM | Admit: 2015-08-20 | Discharge: 2015-08-21 | Disposition: A | Discharge status: Home / Self Care | Payer: Medicare Other | Attending: Emergency Medicine | Admitting: Emergency Medicine

## 2015-08-20 DIAGNOSIS — K429 Umbilical hernia without obstruction or gangrene: Secondary | ICD-10-CM | POA: Insufficient documentation

## 2015-08-20 DIAGNOSIS — R778 Other specified abnormalities of plasma proteins: Secondary | ICD-10-CM

## 2015-08-20 DIAGNOSIS — I2510 Atherosclerotic heart disease of native coronary artery without angina pectoris: Secondary | ICD-10-CM | POA: Insufficient documentation

## 2015-08-20 DIAGNOSIS — Z8673 Personal history of transient ischemic attack (TIA), and cerebral infarction without residual deficits: Secondary | ICD-10-CM | POA: Diagnosis not present

## 2015-08-20 DIAGNOSIS — R7989 Other specified abnormal findings of blood chemistry: Secondary | ICD-10-CM | POA: Insufficient documentation

## 2015-08-20 DIAGNOSIS — I5023 Acute on chronic systolic (congestive) heart failure: Secondary | ICD-10-CM

## 2015-08-20 DIAGNOSIS — Z7982 Long term (current) use of aspirin: Secondary | ICD-10-CM | POA: Diagnosis not present

## 2015-08-20 DIAGNOSIS — Z87891 Personal history of nicotine dependence: Secondary | ICD-10-CM | POA: Diagnosis not present

## 2015-08-20 DIAGNOSIS — M7989 Other specified soft tissue disorders: Secondary | ICD-10-CM | POA: Insufficient documentation

## 2015-08-20 DIAGNOSIS — I110 Hypertensive heart disease with heart failure: Secondary | ICD-10-CM | POA: Insufficient documentation

## 2015-08-20 DIAGNOSIS — Z79899 Other long term (current) drug therapy: Secondary | ICD-10-CM | POA: Diagnosis not present

## 2015-08-20 DIAGNOSIS — R0602 Shortness of breath: Secondary | ICD-10-CM | POA: Diagnosis present

## 2015-08-20 LAB — COMPREHENSIVE METABOLIC PANEL
ALBUMIN: 4 g/dL (ref 3.5–5.0)
ALT: 15 U/L — ABNORMAL LOW (ref 17–63)
ANION GAP: 7 (ref 5–15)
AST: 33 U/L (ref 15–41)
Alkaline Phosphatase: 204 U/L — ABNORMAL HIGH (ref 38–126)
BUN: 16 mg/dL (ref 6–20)
CALCIUM: 9.1 mg/dL (ref 8.9–10.3)
CO2: 28 mmol/L (ref 22–32)
Chloride: 106 mmol/L (ref 101–111)
Creatinine, Ser: 1.32 mg/dL — ABNORMAL HIGH (ref 0.61–1.24)
GFR calc non Af Amer: 53 mL/min — ABNORMAL LOW (ref >60)
GLUCOSE: 87 mg/dL (ref 65–99)
POTASSIUM: 3.5 mmol/L (ref 3.5–5.1)
SODIUM: 141 mmol/L (ref 135–145)
Total Bilirubin: 3 mg/dL — ABNORMAL HIGH (ref 0.3–1.2)
Total Protein: 8.2 g/dL — ABNORMAL HIGH (ref 6.5–8.1)

## 2015-08-20 LAB — TROPONIN I
Troponin I: 0.07 ng/mL — ABNORMAL HIGH (ref <0.031)
Troponin I: 0.06 ng/mL — ABNORMAL HIGH (ref <0.031)

## 2015-08-20 LAB — URINALYSIS COMPLETE WITH MICROSCOPIC (ARMC ONLY)
BILIRUBIN URINE: NEGATIVE
Bacteria, UA: NONE SEEN
Glucose, UA: NEGATIVE mg/dL
HGB URINE DIPSTICK: NEGATIVE
KETONES UR: NEGATIVE mg/dL
LEUKOCYTES UA: NEGATIVE
Nitrite: NEGATIVE
PH: 6 (ref 5.0–8.0)
PROTEIN: 100 mg/dL — AB
SPECIFIC GRAVITY, URINE: 1.018 (ref 1.005–1.030)
Squamous Epithelial / LPF: NONE SEEN

## 2015-08-20 LAB — CBC WITH DIFFERENTIAL/PLATELET
Basophils Absolute: 0.4 10*3/uL — ABNORMAL HIGH (ref 0–0.1)
EOS ABS: 0.3 10*3/uL (ref 0–0.7)
Eosinophils Relative: 3 %
HEMATOCRIT: 42.6 % (ref 40.0–52.0)
Hemoglobin: 13.7 g/dL (ref 13.0–18.0)
LYMPHS ABS: 1.3 10*3/uL (ref 1.0–3.6)
Lymphocytes Relative: 13 %
MCH: 32 pg (ref 26.0–34.0)
MCHC: 32.2 g/dL (ref 32.0–36.0)
MCV: 99.3 fL (ref 80.0–100.0)
Monocytes Absolute: 1.4 10*3/uL — ABNORMAL HIGH (ref 0.2–1.0)
NEUTROS ABS: 6.5 10*3/uL (ref 1.4–6.5)
Platelets: 419 10*3/uL (ref 150–440)
RBC: 4.29 MIL/uL — AB (ref 4.40–5.90)
RDW: 16.6 % — ABNORMAL HIGH (ref 11.5–14.5)
WBC: 9.9 10*3/uL (ref 3.8–10.6)

## 2015-08-20 LAB — BRAIN NATRIURETIC PEPTIDE: B NATRIURETIC PEPTIDE 5: 3376 pg/mL — AB (ref 0.0–100.0)

## 2015-08-20 LAB — ETHANOL: Alcohol, Ethyl (B): <5 mg/dL (ref <5)

## 2015-08-20 LAB — LIPASE, BLOOD: Lipase: 70 U/L — ABNORMAL HIGH (ref 11–51)

## 2015-08-20 MED ORDER — FUROSEMIDE 10 MG/ML IJ SOLN
80.0000 mg | Freq: Once | INTRAMUSCULAR | Status: AC
  Administered 2015-08-20: 80 mg via INTRAVENOUS
  Filled 2015-08-20: qty 8

## 2015-08-20 MED ORDER — ASPIRIN 81 MG PO CHEW
324.0000 mg | CHEWABLE_TABLET | Freq: Once | ORAL | Status: AC
  Administered 2015-08-20: 324 mg via ORAL
  Filled 2015-08-20: qty 4

## 2015-08-20 MED ORDER — IOPAMIDOL (ISOVUE-300) INJECTION 61%
100.0000 mL | Freq: Once | INTRAVENOUS | Status: AC | PRN
  Administered 2015-08-20: 100 mL via INTRAVENOUS

## 2015-08-20 MED ORDER — DIATRIZOATE MEGLUMINE & SODIUM 66-10 % PO SOLN
15.0000 mL | Freq: Once | ORAL | Status: AC
  Administered 2015-08-20: 15 mL via ORAL

## 2015-08-20 NOTE — ED Notes (Signed)
States abd hernia, was seen at [facility] and was told they would not do anything

## 2015-08-20 NOTE — ED Notes (Signed)
Resumed care from [place] rn.  Pt alert. Labs sent.  nsr  on monitor.

## 2015-08-20 NOTE — ED Notes (Signed)
Lab called to report an elevated troponin at 0.07. Will notify Dr. Marlon Pel and primary nurse.

## 2015-08-20 NOTE — ED Notes (Signed)
Patient transported to CT 

## 2015-08-20 NOTE — ED Provider Notes (Signed)
[facility] Emergency Department Provider Note ____________________________________________  Time seen: Approximately 6:09 PM  I have reviewed the triage vital signs and the nursing notes.   HISTORY  Chief Complaint Hernia  HPI Bruce [name] is a 70 y.o. male history of acute on chronic CHF, hypertension, history of medical noncompliance, ischemic cardiomyopathy, coronary artery disease, alcohol abuse, CML on oral chemo, stroke who presents with worsening shortness of breath, leg swelling, abdominal fullness increasing in the past 2 months. He had an episode of chest pain 5 days ago; none since. He was seen by cardiology today and given a prescription for oxycodone.  He also complains of feeling generally weak. He states that he is supposed to take his Lasix twice a day if needed but only takes it once a day because he feels more weak on bid.  He is concerned because he feels like his hernia has moved from the middle of his abdomen around to the side and was told that they can't do anything about it.  He is also concerned because he feels that his speech is different than normal and that his left eye is more open than the right.  Past Medical History  Diagnosis Date  . Coronary artery disease   . Hypertension   . Hypercholesterolemia   . Stroke ([place])     No residual limb weakness.  Walks with cane at baseline.   Marland Kitchen CML (chronic myelocytic leukemia) ([place])   . TIA (transient ischemic attack) 05/10/2014  . Bell's palsy   . Leukemia [facility])     Patient Active Problem List   Diagnosis Date Noted  . Acute on chronic systolic (congestive) heart failure ([place]) 06/03/2015  . Dizziness 01/25/2015  . Acute on chronic combined systolic and diastolic CHF (congestive heart failure) ([place]) 01/25/2015  . Essential hypertension 01/25/2015  . Compliance poor 01/25/2015  . Coronary artery disease   . Cardiomyopathy, ischemic 09/24/2014  . H/O: stroke with residual effects  09/13/2014  . Transaminitis 09/13/2014  . Acute kidney injury ([place]) 09/13/2014  . Abdominal pain 09/13/2014  . Elevated troponin 09/13/2014  . TIA (transient ischemic attack) 05/10/2014  . CML (chronic myelocytic leukemia) ([place]) 03/27/2014  . Leukocytosis 03/18/2014  . Chest pain 03/05/2014    Past Surgical History  Procedure Laterality Date  . Back surgery    . Hip arthroplasty Right     orif  . Orif forearm fracture Right     Current Outpatient Rx  Name  Route  Sig  Dispense  Refill  . acetaminophen (TYLENOL) 325 MG tablet   Oral   Take 2 tablets (650 mg total) by mouth every 4 (four) hours as needed for headache or mild pain.   60 tablet   3   . albuterol (PROVENTIL HFA;VENTOLIN HFA) 108 (90 BASE) MCG/ACT inhaler   Inhalation   Inhale 2 puffs into the lungs every 4 (four) hours as needed for wheezing or shortness of breath.   1 Inhaler   3   . ALPRAZolam (XANAX) 0.25 MG tablet   Oral   Take 0.25 mg by mouth 2 (two) times daily.      0   . aspirin 81 MG chewable tablet   Oral   Chew 1 tablet (81 mg total) by mouth daily.         Marland Kitchen BOSULIF 100 MG tablet   Oral   Take 100 mg by mouth 3 (three) times daily.  Dispense as written.   . carvedilol (COREG) 3.125 MG tablet   Oral   Take 1 tablet (3.125 mg total) by mouth 2 (two) times daily with a meal.   60 tablet   3   . clopidogrel (PLAVIX) 75 MG tablet   Oral   Take 1 tablet (75 mg total) by mouth daily.   30 tablet   3   . digoxin (LANOXIN) 0.125 MG tablet   Oral   Take 1 tablet (0.125 mg total) by mouth daily.   30 tablet   1   . furosemide (LASIX) 80 MG tablet   Oral   Take 80 mg by mouth daily at 12 noon.         . isosorbide-hydrALAZINE (BIDIL) 20-37.5 MG tablet   Oral   Take 0.5 tablets by mouth 2 (two) times daily.   60 tablet   1   . losartan (COZAAR) 50 MG tablet   Oral   Take 50 mg by mouth daily.      0   . oxyCODONE-acetaminophen (PERCOCET/ROXICET) 5-325 MG  tablet   Oral   Take 2 tablets by mouth every 4 (four) hours as needed for severe pain.   3 tablet   0   . potassium chloride SA (K-DUR,KLOR-CON) 20 MEQ tablet   Oral   Take 20 mEq by mouth daily.      0     Allergies Review of patient's allergies indicates no known allergies.  Family History  Problem Relation Age of Onset  . Diabetes Mother   . Hypertension Mother   . Diabetes Father   . Hypertension Father   . Diabetes Brother   . Hypertension Brother   . Diabetes Sister   . Hypertension Sister   . Diabetes Brother   . Hypertension Brother   . Diabetes Sister   . Hypertension Sister     Social History Social History  Substance Use Topics  . Smoking status: Former Smoker -- 0.50 packs/day for 50 years    Types: Cigarettes  . Smokeless tobacco: Never Used  . Alcohol Use: 0.6 oz/week    1 Cans of beer per week     Comment: daily    Lives alone-wife died years ago  Review of Systems Constitutional: No fever; endorses night sweats Eyes: No visual changes.  ENT: No sore throat. Cardiovascular: See history of present illness Respiratory: See history of present illness. Gastrointestinal: see hpi Genitourinary: Negative for dysuria. Musculoskeletal: Negative for back pain. Neurological:see hpi 10-point ROS otherwise negative.  ____________________________________________   PHYSICAL EXAM:  VITAL SIGNS: ED Triage Vitals  Enc Vitals Group     BP 08/20/15 1646 138/82 mmHg     Pulse Rate 08/20/15 1646 72     Resp 08/20/15 1646 18     Temp 08/20/15 1646 98.3 F (36.8 C)     Temp Source 08/20/15 1646 Oral     SpO2 08/20/15 1646 99 %     Weight 08/20/15 1646 173 lb (78.472 kg)     Height 08/20/15 1646 5\' 8"  (1.727 m)     Head Cir --      Peak Flow --      Pain Score 08/20/15 1651 8     Pain Loc --      Pain Edu? --      Excl. in [place]? --    Constitutional: Alert and oriented. Well appearing and in no acute distress. Eyes: Conjunctivae are normal.  PERRL. EOMI; L eye slightly  more open than R Head: Atraumatic. Nose: No congestion/rhinnorhea. Mouth/Throat: Mucous membranes are moist.  Oropharynx non-erythematous. Neck: No stridor.   Cardiovascular: Normal rate, regular rhythm. Grossly normal heart sounds.  Good peripheral circulation. Respiratory: Normal respiratory effort.  No retractions. Lungs CTAB. Gastrointestinal: Soft and nontender.mild distention; soft reducible umbilical hernia. No CVA tenderness. Musculoskeletal: No lower extremity tenderness; 1+ edema B LE   Neurologic:  Normal speech and language. No gross focal neurologic deficits are appreciated. No gait instability. Skin:  Skin is warm, dry and intact. No rash noted. Psychiatric: Mood and affect are normal. Speech and behavior are normal.  ____________________________________________   LABS (all labs ordered are listed, but only abnormal results are displayed)  Labs Reviewed  COMPREHENSIVE METABOLIC PANEL - Abnormal; Notable for the following:    Creatinine, Ser 1.32 (*)    Total Protein 8.2 (*)    ALT 15 (*)    Alkaline Phosphatase 204 (*)    Total Bilirubin 3.0 (*)    GFR calc non Af Amer 53 (*)    All other components within normal limits  TROPONIN I - Abnormal; Notable for the following:    Troponin I 0.07 (*)    All other components within normal limits  CBC WITH DIFFERENTIAL/PLATELET - Abnormal; Notable for the following:    RBC 4.29 (*)    RDW 16.6 (*)    Monocytes Absolute 1.4 (*)    Basophils Absolute 0.4 (*)    All other components within normal limits  BRAIN NATRIURETIC PEPTIDE - Abnormal; Notable for the following:    B Natriuretic Peptide 3376.0 (*)    All other components within normal limits  URINALYSIS COMPLETEWITH MICROSCOPIC ([facility] ONLY) - Abnormal; Notable for the following:    Color, Urine AMBER (*)    APPearance CLEAR (*)    Protein, ur 100 (*)    All other components within normal limits  LIPASE, BLOOD - Abnormal; Notable for the  following:    Lipase 70 (*)    All other components within normal limits  TROPONIN I - Abnormal; Notable for the following:    Troponin I 0.06 (*)    All other components within normal limits  ETHANOL  On 07/11/15, trop-0.24, [id number] ____________________________________________  EKG  ED ECG REPORT I, Ponciano Ort, the attending physician, personally viewed and interpreted this ECG.   Date: 08/20/2015  EKG Time: 1920  Rate: nsr  Rhythm: NSR  Axis: nl  Intervals:nl  ST&T Change:T flat I, II, avL, avF; T inv V5/6 Not changed from 3/31 ____________________________________________  RADIOLOGY  cxr-IMPRESSION:  No frank interstitial edema.  No evidence of acute cardiopulmonary disease.   Ct head-No acute intracranial findings. There is mild generalized atrophy and slight chronic appearing white matter hypodensities which likely represent small vessel ischemic disease.  CT a/p-1. Moderate volume ascites, not significantly changed from 11 in 16. 2. Negative for bowel obstruction, perforation, or focal inflammatory change. 3. Findings suggestive of right heart failure with passive venous congestion of the liver.   ____________________________________________   INITIAL IMPRESSION / ASSESSMENT AND PLAN / ED COURSE  Pertinent labs & imaging results that were available during my care of the patient were reviewed by me and considered in my medical decision making (see chart for details).  Given patient's concern with change in speech I will order CT head.  I suspect CHF causing increased fullness/ascites; will perform cardiac w/u.  Will give lasix 80mg  IV.    With elevated LFTs & increased abd discomfort  w night sweats, will order CT.  Troponin not as elevated as march (was 0.24 then); will repeat; will d/c if no rise.  ----------------------------------------- 12:06 AM on 08/21/2015 -----------------------------------------  She updated on results of extensive  workup. He is feeling much better after the IV Lasix. I instructed him he may take the Lasix twice daily as instructed by his doctor to help with the fluid retention. Strict return precautions given. ____________________________________________   FINAL CLINICAL IMPRESSION(S) / ED DIAGNOSES  Final diagnoses:  Acute on chronic systolic (congestive) heart failure (HCC)  Elevated troponin      New prescriptions started this visit New Prescriptions   No medications on file      Ponciano Ort, MD 08/21/15 0007

## 2015-08-21 NOTE — Discharge Instructions (Signed)
Return to the ER for new or worsening difficulty breathing, chest pain, abdominal pain, fever, vomiting or for any other concerns. Take your Lasix twice a day as directed by your doctor for the next few days to help get off the extra fluid in your belly & legs.   Heart Failure Heart failure means your heart has trouble pumping blood. This makes it hard for your body to work well. Heart failure is usually a long-term (chronic) condition. You must take good care of yourself and follow your doctor's treatment plan. HOME CARE  Take your heart medicine as told by your doctor.  Do not stop taking medicine unless your doctor tells you to.  Do not skip any dose of medicine.  Refill your medicines before they run out.  Take other medicines only as told by your doctor or pharmacist.  Stay active if told by your doctor. The elderly and people with severe heart failure should talk with a doctor about physical activity.  Eat heart-healthy foods. Choose foods that are without trans fat and are low in saturated fat, cholesterol, and salt (sodium). This includes fresh or frozen fruits and vegetables, fish, lean meats, fat-free or low-fat dairy foods, whole grains, and high-fiber foods. Lentils and dried peas and beans (legumes) are also good choices.  Limit salt if told by your doctor.  Cook in a healthy way. Roast, grill, broil, bake, poach, steam, or stir-fry foods.  Limit fluids as told by your doctor.  Weigh yourself every morning. Do this after you pee (urinate) and before you eat breakfast. Write down your weight to give to your doctor.  Take your blood pressure and write it down if your doctor tells you to.  Ask your doctor how to check your pulse. Check your pulse as told.  Lose weight if told by your doctor.  Stop smoking or chewing tobacco. Do not use gum or patches that help you quit without your doctor's approval.  Schedule and go to doctor visits as told.  Nonpregnant women should  have no more than 1 drink a day. Men should have no more than 2 drinks a day. Talk to your doctor about drinking alcohol.  Stop illegal drug use.  Stay current with shots (immunizations).  Manage your health conditions as told by your doctor.  Learn to manage your stress.  Rest when you are tired.  If it is really hot outside:  Avoid intense activities.  Use air conditioning or fans, or get in a cooler place.  Avoid caffeine and alcohol.  Wear loose-fitting, lightweight, and light-colored clothing.  If it is really cold outside:  Avoid intense activities.  Layer your clothing.  Wear mittens or gloves, a hat, and a scarf when going outside.  Avoid alcohol.  Learn about heart failure and get support as needed.  Get help to maintain or improve your quality of life and your ability to care for yourself as needed. GET HELP IF:   You gain weight quickly.  You are more short of breath than usual.  You cannot do your normal activities.  You tire easily.  You cough more than normal, especially with activity.  You have any or more puffiness (swelling) in areas such as your hands, feet, ankles, or belly (abdomen).  You cannot sleep because it is hard to breathe.  You feel like your heart is beating fast (palpitations).  You get dizzy or light-headed when you stand up. GET HELP RIGHT AWAY IF:   You have trouble  breathing.  There is a change in mental status, such as becoming less alert or not being able to focus.  You have chest pain or discomfort.  You faint. MAKE SURE YOU:   Understand these instructions.  Will watch your condition.  Will get help right away if you are not doing well or get worse.   This information is not intended to replace advice given to you by your health care provider. Make sure you discuss any questions you have with your health care provider.   Document Released: 01/06/2008 Document Revised: 04/19/2014 Document Reviewed:  05/15/2012 Elsevier Interactive Patient Education [employer].

## 2015-08-22 ENCOUNTER — Emergency Department
Admission: EM | Admit: 2015-08-22 | Discharge: 2015-08-22 | Disposition: A | Discharge status: Home / Self Care | Payer: Medicare Other | Attending: Emergency Medicine | Admitting: Emergency Medicine

## 2015-08-22 ENCOUNTER — Encounter: Payer: Self-pay | Admitting: Emergency Medicine

## 2015-08-22 DIAGNOSIS — Z7982 Long term (current) use of aspirin: Secondary | ICD-10-CM | POA: Diagnosis not present

## 2015-08-22 DIAGNOSIS — Z5321 Procedure and treatment not carried out due to patient leaving prior to being seen by health care provider: Secondary | ICD-10-CM | POA: Diagnosis not present

## 2015-08-22 DIAGNOSIS — Z79899 Other long term (current) drug therapy: Secondary | ICD-10-CM | POA: Insufficient documentation

## 2015-08-22 DIAGNOSIS — I10 Essential (primary) hypertension: Secondary | ICD-10-CM | POA: Diagnosis not present

## 2015-08-22 DIAGNOSIS — R109 Unspecified abdominal pain: Secondary | ICD-10-CM | POA: Diagnosis present

## 2015-08-22 DIAGNOSIS — I2510 Atherosclerotic heart disease of native coronary artery without angina pectoris: Secondary | ICD-10-CM | POA: Diagnosis not present

## 2015-08-22 DIAGNOSIS — Z87891 Personal history of nicotine dependence: Secondary | ICD-10-CM | POA: Diagnosis not present

## 2015-08-22 DIAGNOSIS — Z8673 Personal history of transient ischemic attack (TIA), and cerebral infarction without residual deficits: Secondary | ICD-10-CM | POA: Diagnosis not present
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
## 2015-08-22 NOTE — ED Notes (Addendum)
Patient to ER for c/o abdominal pain. States "I've got this hernia that's shifted on me". Patient also c/o weakness.

## 2015-08-25 ENCOUNTER — Encounter (HOSPITAL_COMMUNITY): Payer: Self-pay | Admitting: Emergency Medicine

## 2015-08-25 ENCOUNTER — Emergency Department (HOSPITAL_COMMUNITY)
Admission: EM | Admit: 2015-08-25 | Discharge: 2015-08-25 | Disposition: A | Discharge status: Home / Self Care | Payer: Medicare Other | Attending: Emergency Medicine | Admitting: Emergency Medicine

## 2015-08-25 DIAGNOSIS — Z96641 Presence of right artificial hip joint: Secondary | ICD-10-CM | POA: Diagnosis not present

## 2015-08-25 DIAGNOSIS — R109 Unspecified abdominal pain: Secondary | ICD-10-CM | POA: Diagnosis not present

## 2015-08-25 DIAGNOSIS — Z7982 Long term (current) use of aspirin: Secondary | ICD-10-CM | POA: Insufficient documentation

## 2015-08-25 DIAGNOSIS — Z7902 Long term (current) use of antithrombotics/antiplatelets: Secondary | ICD-10-CM | POA: Insufficient documentation

## 2015-08-25 DIAGNOSIS — Z8673 Personal history of transient ischemic attack (TIA), and cerebral infarction without residual deficits: Secondary | ICD-10-CM | POA: Insufficient documentation

## 2015-08-25 DIAGNOSIS — Z87891 Personal history of nicotine dependence: Secondary | ICD-10-CM | POA: Diagnosis not present

## 2015-08-25 DIAGNOSIS — E7800 Pure hypercholesterolemia, unspecified: Secondary | ICD-10-CM | POA: Insufficient documentation

## 2015-08-25 DIAGNOSIS — I2510 Atherosclerotic heart disease of native coronary artery without angina pectoris: Secondary | ICD-10-CM | POA: Diagnosis not present

## 2015-08-25 DIAGNOSIS — I10 Essential (primary) hypertension: Secondary | ICD-10-CM | POA: Diagnosis not present

## 2015-08-25 DIAGNOSIS — R770 Abnormality of albumin: Secondary | ICD-10-CM | POA: Diagnosis not present

## 2015-08-25 DIAGNOSIS — R531 Weakness: Secondary | ICD-10-CM | POA: Diagnosis present

## 2015-08-25 LAB — CBC WITH DIFFERENTIAL/PLATELET
Basophils Absolute: 0.4 10*3/uL — ABNORMAL HIGH (ref 0.0–0.1)
Basophils Relative: 4 %
Eosinophils Absolute: 0.3 10*3/uL (ref 0.0–0.7)
Eosinophils Relative: 3 %
HCT: 35.5 % — ABNORMAL LOW (ref 39.0–52.0)
Hemoglobin: 11.5 g/dL — ABNORMAL LOW (ref 13.0–17.0)
Lymphocytes Relative: 16 %
Lymphs Abs: 1.7 10*3/uL (ref 0.7–4.0)
MCH: 31.6 pg (ref 26.0–34.0)
MCHC: 32.4 g/dL (ref 30.0–36.0)
MCV: 97.5 fL (ref 78.0–100.0)
Monocytes Absolute: 1.1 10*3/uL — ABNORMAL HIGH (ref 0.1–1.0)
Monocytes Relative: 11 %
Neutro Abs: 6.9 10*3/uL (ref 1.7–7.7)
Neutrophils Relative %: 66 %
Platelets: 408 10*3/uL — ABNORMAL HIGH (ref 150–400)
RBC: 3.64 MIL/uL — ABNORMAL LOW (ref 4.22–5.81)
RDW: 16.2 % — ABNORMAL HIGH (ref 11.5–15.5)
WBC: 10.4 10*3/uL (ref 4.0–10.5)

## 2015-08-25 LAB — URINALYSIS, ROUTINE W REFLEX MICROSCOPIC
Glucose, UA: NEGATIVE mg/dL
Hgb urine dipstick: NEGATIVE
Ketones, ur: NEGATIVE mg/dL
Nitrite: NEGATIVE
Protein, ur: NEGATIVE mg/dL
Specific Gravity, Urine: 1.022 (ref 1.005–1.030)
pH: 6 (ref 5.0–8.0)

## 2015-08-25 LAB — COMPREHENSIVE METABOLIC PANEL
ALT: 17 U/L (ref 17–63)
AST: 24 U/L (ref 15–41)
Albumin: 3.5 g/dL (ref 3.5–5.0)
Alkaline Phosphatase: 172 U/L — ABNORMAL HIGH (ref 38–126)
Anion gap: 7 (ref 5–15)
BUN: 21 mg/dL — ABNORMAL HIGH (ref 6–20)
CO2: 28 mmol/L (ref 22–32)
Calcium: 9 mg/dL (ref 8.9–10.3)
Chloride: 106 mmol/L (ref 101–111)
Creatinine, Ser: 1.15 mg/dL (ref 0.61–1.24)
GFR calc Af Amer: >60 mL/min (ref >60)
GFR calc non Af Amer: >60 mL/min (ref >60)
Glucose, Bld: 105 mg/dL — ABNORMAL HIGH (ref 65–99)
Potassium: 3.5 mmol/L (ref 3.5–5.1)
Sodium: 141 mmol/L (ref 135–145)
Total Bilirubin: 2.7 mg/dL — ABNORMAL HIGH (ref 0.3–1.2)
Total Protein: 7 g/dL (ref 6.5–8.1)

## 2015-08-25 LAB — URINE MICROSCOPIC-ADD ON

## 2015-08-25 LAB — LIPASE, BLOOD: Lipase: 37 U/L (ref 11–51)

## 2015-08-25 MED ORDER — DICYCLOMINE HCL 10 MG PO CAPS
10.0000 mg | ORAL_CAPSULE | Freq: Once | ORAL | Status: AC
  Administered 2015-08-25: 10 mg via ORAL
  Filled 2015-08-25: qty 1

## 2015-08-25 NOTE — Discharge Instructions (Signed)

## 2015-08-25 NOTE — ED Notes (Addendum)
Pt c/o generalized body weakness. Pt has equal strengths bilaterally in arms and legs. Pt's L side of face appears drooped, pt unable to puff out cheeks on the L side. Pt refusing to smile, pt sts "I'm hurting I don't feel like smiling."  Pt asked to smile again and told it was for his assessment, pt still refuses. Pt A&Ox4. Pt sts the weakness has been going on for two months. Pt has hx of strokes and sts his only deficit is in his L eye. Pt is a poor historian, difficult to get information out of. Pt sts his speech is slurred. Difficult to assess, unsure of pt's baseline. Pt has a hx of Bell's palsy per chart, but sts that affects his L side.

## 2015-08-25 NOTE — ED Notes (Signed)
Patient aware that a urine sample is needed. Urinal is at the bedside.  

## 2015-09-03 NOTE — ED Provider Notes (Signed)
CSN: [id number]     Arrival date & time 08/25/15  1314 History   First MD Initiated Contact with Patient 08/25/15 1332     Chief Complaint  Patient presents with  . Weakness     (Consider location/radiation/quality/duration/timing/severity/associated sxs/prior Treatment) HPI   69 year old male with really multitude of complaints. He is generally very poor historian and tangential. It is hard for me to even to assign him a chief complaint. He started off talking to me about his prior right leg injury and quickly switched to talking about leukemia  And then talking about his history of coronary artery disease. He then began talking about prior strokes without any transition. He also mentioned abdominal pain at one point but I cannot remember the context if any.  I tried several times to specifically ask him what brought him to the emergency room today but he simply gave me various renditions of his past medical history.   Past Medical History  Diagnosis Date  . Coronary artery disease   . Hypertension   . Hypercholesterolemia   . Stroke ([place])     No residual limb weakness.  Walks with cane at baseline.   Marland Kitchen CML (chronic myelocytic leukemia) ([place])   . TIA (transient ischemic attack) 05/10/2014  . Bell's palsy   . Leukemia [facility])    Past Surgical History  Procedure Laterality Date  . Back surgery    . Hip arthroplasty Right     orif  . Orif forearm fracture Right    Family History  Problem Relation Age of Onset  . Diabetes Mother   . Hypertension Mother   . Diabetes Father   . Hypertension Father   . Diabetes Brother   . Hypertension Brother   . Diabetes Sister   . Hypertension Sister   . Diabetes Brother   . Hypertension Brother   . Diabetes Sister   . Hypertension Sister    Social History  Substance Use Topics  . Smoking status: Former Smoker -- 0.50 packs/day for 50 years    Types: Cigarettes  . Smokeless tobacco: Never Used  . Alcohol Use: 0.6 oz/week    1 Cans of  beer per week     Comment: daily     Review of Systems  All systems reviewed and negative, other than as noted in HPI.   Allergies  Review of patient's allergies indicates no known allergies.  Home Medications   Prior to Admission medications   Medication Sig Start Date End Date Taking? Authorizing Provider  acetaminophen (TYLENOL) 325 MG tablet Take 2 tablets (650 mg total) by mouth every 4 (four) hours as needed for headache or mild pain. 06/06/15  Yes [name], MD  albuterol (PROVENTIL HFA;VENTOLIN HFA) 108 (90 BASE) MCG/ACT inhaler Inhale 2 puffs into the lungs every 4 (four) hours as needed for wheezing or shortness of breath. 08/08/14  Yes Noemi Chapel, MD  ALPRAZolam Duanne Moron) 0.25 MG tablet Take 0.25 mg by mouth 2 (two) times daily. 06/10/15  Yes Historical Provider, MD  aspirin 81 MG chewable tablet Chew 1 tablet (81 mg total) by mouth daily. 09/22/14  Yes Hosie Poisson, MD  carvedilol (COREG) 6.25 MG tablet Take 6.25 mg by mouth 2 (two) times daily.   Yes Historical Provider, MD  clopidogrel (PLAVIX) 75 MG tablet Take 1 tablet (75 mg total) by mouth daily. 03/21/14  Yes [name], MD  digoxin (LANOXIN) 0.125 MG tablet Take 1 tablet (0.125 mg total) by mouth daily. 01/29/15  Yes  Barton Dubois, MD  furosemide (LASIX) 80 MG tablet Take 80 mg by mouth daily at 12 noon. 03/27/15  Yes Historical Provider, MD  isosorbide-hydrALAZINE (BIDIL) 20-37.5 MG tablet Take 0.5 tablets by mouth 2 (two) times daily. 01/29/15  Yes Barton Dubois, MD  lisinopril (PRINIVIL,ZESTRIL) 5 MG tablet Take 5 mg by mouth. 08/23/15 09/22/15 Yes Historical Provider, MD  losartan (COZAAR) 50 MG tablet Take 50 mg by mouth daily. 06/10/15  Yes Historical Provider, MD  oxyCODONE-acetaminophen (PERCOCET/ROXICET) 5-325 MG tablet Take 2 tablets by mouth every 4 (four) hours as needed for severe pain. 06/24/15  Yes Chesley Noon Nadeau, PA-C  potassium chloride SA (K-DUR,KLOR-CON) 20 MEQ tablet Take 20 mEq by mouth  daily. 06/10/15  Yes Historical Provider, MD  BOSULIF 100 MG tablet Take 100 mg by mouth 3 (three) times daily. 05/19/15   Historical Provider, MD  carvedilol (COREG) 3.125 MG tablet Take 1 tablet (3.125 mg total) by mouth 2 (two) times daily with a meal. Patient not taking: Reported on 08/25/2015 06/06/15   [name], MD  ENTRESTO 24-26 MG Take 1 tablet by mouth 2 (two) times daily.  08/05/15   Historical Provider, MD   BP 125/78 mmHg  Pulse 64  Resp 28  SpO2 92% Physical Exam  Constitutional: He is oriented to person, place, and time. He appears well-developed and well-nourished. No distress.  HENT:  Head: Normocephalic and atraumatic.  Eyes: Conjunctivae are normal. Right eye exhibits no discharge. Left eye exhibits no discharge.  Neck: Neck supple.  Cardiovascular: Normal rate, regular rhythm and normal heart sounds.  Exam reveals no gallop and no friction rub.   No murmur heard. Pulmonary/Chest: Effort normal and breath sounds normal. No respiratory distress.  Abdominal: Soft. He exhibits no distension. There is no tenderness.  Musculoskeletal: He exhibits no edema or tenderness.  Neurological: He is alert and oriented to person, place, and time. No cranial nerve deficit. He exhibits normal muscle tone. Coordination normal.  Skin: Skin is warm and dry.  Psychiatric: He has a normal mood and affect. His behavior is normal. Thought content normal.  Nursing note and vitals reviewed.   ED Course  Procedures (including critical care time) Labs Review Labs Reviewed  URINALYSIS, ROUTINE W REFLEX MICROSCOPIC (NOT AT [facility]) - Abnormal; Notable for the following:    Color, Urine AMBER (*)    Bilirubin Urine SMALL (*)    Leukocytes, UA TRACE (*)    All other components within normal limits  COMPREHENSIVE METABOLIC PANEL - Abnormal; Notable for the following:    Glucose, Bld 105 (*)    BUN 21 (*)    Alkaline Phosphatase 172 (*)    Total Bilirubin 2.7 (*)    All other components within  normal limits  CBC WITH DIFFERENTIAL/PLATELET - Abnormal; Notable for the following:    RBC 3.64 (*)    Hemoglobin 11.5 (*)    HCT 35.5 (*)    RDW 16.2 (*)    Platelets 408 (*)    Monocytes Absolute 1.1 (*)    Basophils Absolute 0.4 (*)    All other components within normal limits  URINE MICROSCOPIC-ADD ON - Abnormal; Notable for the following:    Squamous Epithelial / LPF 0-5 (*)    Bacteria, UA RARE (*)    All other components within normal limits  LIPASE, BLOOD    Imaging Review No results found. I have personally reviewed and evaluated these images and lab results as part of my medical decision-making.   EKG  Interpretation   Date/Time:  Monday Aug 25 2015 13:22:02 EDT Ventricular Rate:  65 PR Interval:  165 QRS Duration: 102 QT Interval:  474 QTC Calculation: 493 R Axis:   -25 Text Interpretation:  Sinus rhythm Probable left atrial enlargement  Borderline left axis deviation Abnormal T, consider ischemia, lateral  leads No significant change since last tracing Confirmed by ALLEN  MD,  ANTHONY ([id number]) on 08/27/2015 11:58:44 AM      MDM   Final diagnoses:  Abdominal pain, unspecified abdominal location   Albumin no acute bony  .    Virgel Manifold, MD 09/03/15 2256

## 2015-09-05 ENCOUNTER — Emergency Department (HOSPITAL_COMMUNITY): Payer: Medicare Other

## 2015-09-05 ENCOUNTER — Encounter (HOSPITAL_COMMUNITY): Payer: Self-pay | Admitting: Emergency Medicine

## 2015-09-05 ENCOUNTER — Emergency Department (HOSPITAL_COMMUNITY)
Admission: EM | Admit: 2015-09-05 | Discharge: 2015-09-05 | Disposition: A | Discharge status: Home / Self Care | Payer: Medicare Other | Attending: Emergency Medicine | Admitting: Emergency Medicine

## 2015-09-05 DIAGNOSIS — E7800 Pure hypercholesterolemia, unspecified: Secondary | ICD-10-CM | POA: Diagnosis not present

## 2015-09-05 DIAGNOSIS — R05 Cough: Secondary | ICD-10-CM | POA: Diagnosis not present

## 2015-09-05 DIAGNOSIS — I10 Essential (primary) hypertension: Secondary | ICD-10-CM | POA: Insufficient documentation

## 2015-09-05 DIAGNOSIS — F17210 Nicotine dependence, cigarettes, uncomplicated: Secondary | ICD-10-CM | POA: Insufficient documentation

## 2015-09-05 DIAGNOSIS — Z8673 Personal history of transient ischemic attack (TIA), and cerebral infarction without residual deficits: Secondary | ICD-10-CM | POA: Diagnosis not present

## 2015-09-05 DIAGNOSIS — R079 Chest pain, unspecified: Secondary | ICD-10-CM | POA: Diagnosis present

## 2015-09-05 DIAGNOSIS — Z7982 Long term (current) use of aspirin: Secondary | ICD-10-CM | POA: Insufficient documentation

## 2015-09-05 DIAGNOSIS — R4701 Aphasia: Secondary | ICD-10-CM | POA: Insufficient documentation

## 2015-09-05 DIAGNOSIS — R42 Dizziness and giddiness: Secondary | ICD-10-CM | POA: Diagnosis not present

## 2015-09-05 DIAGNOSIS — I2510 Atherosclerotic heart disease of native coronary artery without angina pectoris: Secondary | ICD-10-CM | POA: Diagnosis not present

## 2015-09-05 DIAGNOSIS — Z79899 Other long term (current) drug therapy: Secondary | ICD-10-CM | POA: Insufficient documentation

## 2015-09-05 LAB — URINALYSIS, ROUTINE W REFLEX MICROSCOPIC
BILIRUBIN URINE: NEGATIVE
GLUCOSE, UA: NEGATIVE mg/dL
Hgb urine dipstick: NEGATIVE
KETONES UR: NEGATIVE mg/dL
LEUKOCYTES UA: NEGATIVE
NITRITE: NEGATIVE
PROTEIN: NEGATIVE mg/dL
Specific Gravity, Urine: 1.017 (ref 1.005–1.030)
pH: 7.5 (ref 5.0–8.0)

## 2015-09-05 LAB — CBC
HCT: 39.9 % (ref 39.0–52.0)
HEMOGLOBIN: 12.4 g/dL — AB (ref 13.0–17.0)
MCH: 30.7 pg (ref 26.0–34.0)
MCHC: 31.1 g/dL (ref 30.0–36.0)
MCV: 98.8 fL (ref 78.0–100.0)
PLATELETS: 563 10*3/uL — AB (ref 150–400)
RBC: 4.04 MIL/uL — AB (ref 4.22–5.81)
RDW: 15.8 % — ABNORMAL HIGH (ref 11.5–15.5)
WBC: 15.8 10*3/uL — ABNORMAL HIGH (ref 4.0–10.5)

## 2015-09-05 LAB — BASIC METABOLIC PANEL
ANION GAP: 7 (ref 5–15)
BUN: 14 mg/dL (ref 6–20)
CALCIUM: 9.6 mg/dL (ref 8.9–10.3)
CO2: 27 mmol/L (ref 22–32)
CREATININE: 1.13 mg/dL (ref 0.61–1.24)
Chloride: 105 mmol/L (ref 101–111)
Glucose, Bld: 112 mg/dL — ABNORMAL HIGH (ref 65–99)
Potassium: 4.1 mmol/L (ref 3.5–5.1)
SODIUM: 139 mmol/L (ref 135–145)

## 2015-09-05 LAB — I-STAT TROPONIN, ED: TROPONIN I, POC: 0.03 ng/mL (ref 0.00–0.08)

## 2015-09-05 NOTE — ED Provider Notes (Signed)
CSN: [id number]     Arrival date & time 09/05/15  [id number] History   First MD Initiated Contact with Patient 09/05/15 1124     Chief Complaint  Patient presents with  . Chest Pain  . Aphasia   PT HAS HAD MULTIPLE VISITS TO VARIOUS [place] [facility] REGARDING WEAKNESS.  THE PT IS A POOR HISTORIAN AND IS VERY TANGENTIAL IN HIS SPEECH.  THE PT INITIALLY THOUGHT HE HAD A STROKE BECAUSE HE COULD NOT TALK, BUT HE SPEAKS FINE.  THE PT THEN STARTED TALKING ABOUT A COUGH THAT HE'S HAD FOR MONTHS.  THEY HE SPOKE ABOUT HIS LEGS WHICH WERE SWOLLEN, BUT THAT HAS IMPROVED.  THE PT MAINLY SEEMS TO WANT REASSURANCE THAT HE IS OK.  PT DENIED ANY CP TO ME.  HE TOLD THE TRIAGE NURSE THAT HIS PAIN WAS 9/10.  (Consider location/radiation/quality/duration/timing/severity/associated sxs/prior Treatment) Patient is a 70 y.o. male presenting with chest pain. The history is provided by the patient.  Chest Pain Associated symptoms: cough and dizziness     Past Medical History  Diagnosis Date  . Coronary artery disease   . Hypertension   . Hypercholesterolemia   . Stroke ([place])     No residual limb weakness.  Walks with cane at baseline.   Marland Kitchen TIA (transient ischemic attack) 05/10/2014  . Bell's palsy   . CML (chronic myelocytic leukemia) ([place])   . Leukemia [facility])    Past Surgical History  Procedure Laterality Date  . Back surgery    . Hip arthroplasty Right     orif  . Orif forearm fracture Right    Family History  Problem Relation Age of Onset  . Diabetes Mother   . Hypertension Mother   . Diabetes Father   . Hypertension Father   . Diabetes Brother   . Hypertension Brother   . Diabetes Sister   . Hypertension Sister   . Diabetes Brother   . Hypertension Brother   . Diabetes Sister   . Hypertension Sister    Social History  Substance Use Topics  . Smoking status: Former Smoker -- 0.50 packs/day for 50 years    Types: Cigarettes  . Smokeless tobacco: Never Used  . Alcohol Use: 0.6 oz/week    1 Cans  of beer per week     Comment: daily     Review of Systems  Respiratory: Positive for cough.   Cardiovascular: Positive for chest pain.  Neurological: Positive for dizziness and speech difficulty.  All other systems reviewed and are negative.     Allergies  Review of patient's allergies indicates no known allergies.  Home Medications   Prior to Admission medications   Medication Sig Start Date End Date Taking? Authorizing Provider  acetaminophen (TYLENOL) 325 MG tablet Take 2 tablets (650 mg total) by mouth every 4 (four) hours as needed for headache or mild pain. 06/06/15  Yes [name], MD  albuterol (PROVENTIL HFA;VENTOLIN HFA) 108 (90 BASE) MCG/ACT inhaler Inhale 2 puffs into the lungs every 4 (four) hours as needed for wheezing or shortness of breath. 08/08/14  Yes Noemi Chapel, MD  ALPRAZolam Duanne Moron) 0.25 MG tablet Take 0.25 mg by mouth 2 (two) times daily. 06/10/15  Yes Historical Provider, MD  aspirin 81 MG chewable tablet Chew 1 tablet (81 mg total) by mouth daily. 09/22/14  Yes Hosie Poisson, MD  BOSULIF 100 MG tablet Take 100 mg by mouth 3 (three) times daily. 05/19/15  Yes Historical Provider, MD  carvedilol (COREG) 6.25 MG tablet  Take 6.25 mg by mouth 2 (two) times daily.   Yes Historical Provider, MD  clopidogrel (PLAVIX) 75 MG tablet Take 1 tablet (75 mg total) by mouth daily. 03/21/14  Yes [name], MD  digoxin (LANOXIN) 0.125 MG tablet Take 1 tablet (0.125 mg total) by mouth daily. 01/29/15  Yes Barton Dubois, MD  ENTRESTO 24-26 MG Take 1 tablet by mouth 2 (two) times daily.  08/05/15  Yes Historical Provider, MD  furosemide (LASIX) 80 MG tablet Take 80 mg by mouth daily at 12 noon. 03/27/15  Yes Historical Provider, MD  lisinopril (PRINIVIL,ZESTRIL) 5 MG tablet Take 5 mg by mouth. 08/23/15 09/22/15 Yes Historical Provider, MD  losartan (COZAAR) 50 MG tablet Take 50 mg by mouth daily. 06/10/15  Yes Historical Provider, MD  oxyCODONE-acetaminophen (PERCOCET/ROXICET) 5-325  MG tablet Take 2 tablets by mouth every 4 (four) hours as needed for severe pain. 06/24/15  Yes Nona Dell, PA-C  carvedilol (COREG) 3.125 MG tablet Take 1 tablet (3.125 mg total) by mouth 2 (two) times daily with a meal. Patient not taking: Reported on 08/25/2015 06/06/15   [name], MD  isosorbide-hydrALAZINE (BIDIL) 20-37.5 MG tablet Take 0.5 tablets by mouth 2 (two) times daily. 01/29/15   Barton Dubois, MD  potassium chloride SA (K-DUR,KLOR-CON) 20 MEQ tablet Take 20 mEq by mouth daily. Reported on 09/05/2015 06/10/15   Historical Provider, MD   BP 134/90 mmHg  Pulse 64  Temp(Src)   Resp 17  SpO2 100% Physical Exam  Constitutional: He is oriented to person, place, and time. He appears well-developed and well-nourished.  HENT:  Head: Normocephalic and atraumatic.  Right Ear: External ear normal.  Left Ear: External ear normal.  Nose: Nose normal.  Mouth/Throat: Oropharynx is clear and moist.  Eyes: Conjunctivae and EOM are normal.  LEFT EYE IS BLIND  Neck: Normal range of motion. Neck supple.  Cardiovascular: Normal rate, regular rhythm, normal heart sounds and intact distal pulses.   Pulmonary/Chest: Effort normal and breath sounds normal.  Abdominal: Soft. Bowel sounds are normal.  Musculoskeletal: Normal range of motion.  Neurological: He is alert and oriented to person, place, and time.  PT HAS A CHRONIC LEFT FACIAL DROOP  Skin: Skin is warm and dry.  Psychiatric: His behavior is normal. Thought content normal. His mood appears anxious. His speech is tangential. Cognition and memory are normal.  Nursing note and vitals reviewed.   ED Course  Procedures (including critical care time) Labs Review Labs Reviewed  BASIC METABOLIC PANEL - Abnormal; Notable for the following:    Glucose, Bld 112 (*)    All other components within normal limits  CBC - Abnormal; Notable for the following:    WBC 15.8 (*)    RBC 4.04 (*)    Hemoglobin 12.4 (*)    RDW 15.8 (*)     Platelets 563 (*)    All other components within normal limits  URINALYSIS, ROUTINE W REFLEX MICROSCOPIC (NOT AT [facility]) - Abnormal; Notable for the following:    Color, Urine AMBER (*)    All other components within normal limits  I-STAT TROPOININ, ED    Imaging Review Dg Chest 2 View  09/05/2015  CLINICAL DATA:  Slurred speech and dizziness EXAM: CHEST  2 VIEW COMPARISON:  08/20/2015 FINDINGS: Chronic cardiomegaly. Stable aortic and hilar contours. There is no edema, consolidation, effusion, or pneumothorax. Degenerative endplate spurring. IMPRESSION: No evidence of acute disease. Cardiomegaly. Electronically Signed   By: Monte Fantasia M.D.   On:  09/05/2015 10:45   Ct Head Wo Contrast  09/05/2015  CLINICAL DATA:  Slurred speech for 2 days. EXAM: CT HEAD WITHOUT CONTRAST TECHNIQUE: Contiguous axial images were obtained from the base of the skull through the vertex without intravenous contrast. COMPARISON:  08/20/2015 FINDINGS: Skull and Sinuses:No acute finding. Remote bilateral nasal arch fracture. Stable right posterior scalp scarring. Visualized orbits: Concavity of the bilateral medial orbital wall is presumably bilateral blowout fracture given associated nasal fractures. This appearance can also be seen with orbital decompression for thyroid ophthalmopathy. Brain: No evidence of acute infarction, hemorrhage, hydrocephalus, or mass lesion/mass effect. Tiny low density over the medial thalamus is stable from 2015 CT (likely not visualized previously due to slice selection). Mild generalized volume loss. IMPRESSION: Stable exam.  No evidence of acute intracranial disease. Electronically Signed   By: Monte Fantasia M.D.   On: 09/05/2015 11:43   I have personally reviewed and evaluated these images and lab results as part of my medical decision-making.   EKG Interpretation   Date/Time:  Friday Sep 05 2015 10:03:03 EDT Ventricular Rate:  68 PR Interval:  162 QRS Duration: 94 QT Interval:   440 QTC Calculation: 467 R Axis:   -61 Text Interpretation:  Sinus rhythm with occasional Premature ventricular  complexes Left anterior fascicular block T wave abnormality, consider  lateral ischemia Prolonged QT Abnormal ECG Confirmed by Hazle Coca [phone])  on 09/05/2015 10:04:54 AM      MDM  PT IS FEELING BETTER.  HE KNOWS TO RETURN IF WORSE. Final diagnoses:  Dizziness       Isla Pence, MD 09/05/15 [phone]

## 2015-09-05 NOTE — ED Notes (Signed)
To ED via private vehicle with c/o chest pain-- ongoing-- and slurred speech that started on Wednesday-- pt admits to having multiple tia's. Speech is slurred and having midsternal chest pain 9/10 at present-- has been taking oxycodone for pain.

## 2015-09-05 NOTE — Discharge Instructions (Signed)
Dizziness °Dizziness is a common problem. It makes you feel unsteady or lightheaded. You may feel like you are about to pass out (faint). Dizziness can lead to injury if you stumble or fall. Anyone can get dizzy, but dizziness is more common in older adults. This condition can be caused by a number of things, including: °· Medicines. °· Dehydration. °· Illness. °HOME CARE °Following these instructions may help with your condition: °Eating and Drinking °· Drink enough fluid to keep your pee (urine) clear or pale yellow. This helps to keep you from getting dehydrated. Try to drink more clear fluids, such as water. °· Do not drink alcohol. °· Limit how much caffeine you drink or eat if told by your doctor. °· Limit how much salt you drink or eat if told by your doctor. °Activity °· Avoid making quick movements. °¨ When you stand up from sitting in a chair, steady yourself until you feel okay. °¨ In the morning, first sit up on the side of the bed. When you feel okay, stand slowly while you hold onto something. Do this until you know that your balance is fine. °· Move your legs often if you need to stand in one place for a long time. Tighten and relax your muscles in your legs while you are standing. °· Do not drive or use heavy machinery if you feel dizzy. °· Avoid bending down if you feel dizzy. Place items in your home so that they are easy for you to reach without leaning over. °Lifestyle °· Do not use any tobacco products, including cigarettes, chewing tobacco, or electronic cigarettes. If you need help quitting, ask your doctor. °· Try to lower your stress level, such as with yoga or meditation. Talk with your doctor if you need help. °General Instructions °· Watch your dizziness for any changes. °· Take medicines only as told by your doctor. Talk with your doctor if you think that your dizziness is caused by a medicine that you are taking. °· Tell a friend or a family member that you are feeling dizzy. If he or  she notices any changes in your behavior, have this person call your doctor. °· Keep all follow-up visits as told by your doctor. This is important. °GET HELP IF: °· Your dizziness does not go away. °· Your dizziness or light-headedness gets worse. °· You feel sick to your stomach (nauseous). °· You have trouble hearing. °· You have new symptoms. °· You are unsteady on your feet or you feel like the room is spinning. °GET HELP RIGHT AWAY IF: °· You throw up (vomit) or have diarrhea and are unable to eat or drink anything. °· You have trouble: °¨ Talking. °¨ Walking. °¨ Swallowing. °¨ Using your arms, hands, or legs. °· You feel generally weak. °· You are not thinking clearly or you have trouble forming sentences. It may take a friend or family member to notice this. °· You have: °¨ Chest pain. °¨ Pain in your belly (abdomen). °¨ Shortness of breath. °¨ Sweating. °· Your vision changes. °· You are bleeding. °· You have a headache. °· You have neck pain or a stiff neck. °· You have a fever. °  °This information is not intended to replace advice given to you by your health care provider. Make sure you discuss any questions you have with your health care provider. °  °Document Released: 03/18/2011 Document Revised: 08/13/2014 Document Reviewed: 03/25/2014 °Elsevier Interactive Patient Education ©2016 Elsevier Inc[employer] ° °

## 2015-09-25 ENCOUNTER — Emergency Department (HOSPITAL_COMMUNITY): Payer: Medicare Other

## 2015-09-25 ENCOUNTER — Encounter (HOSPITAL_COMMUNITY): Payer: Self-pay | Admitting: Emergency Medicine

## 2015-09-25 ENCOUNTER — Emergency Department (HOSPITAL_COMMUNITY)
Admission: EM | Admit: 2015-09-25 | Discharge: 2015-09-25 | Disposition: A | Discharge status: Home / Self Care | Payer: Medicare Other | Attending: Emergency Medicine | Admitting: Emergency Medicine

## 2015-09-25 DIAGNOSIS — R0602 Shortness of breath: Secondary | ICD-10-CM | POA: Insufficient documentation

## 2015-09-25 DIAGNOSIS — R05 Cough: Secondary | ICD-10-CM | POA: Insufficient documentation

## 2015-09-25 DIAGNOSIS — Z7982 Long term (current) use of aspirin: Secondary | ICD-10-CM | POA: Diagnosis not present

## 2015-09-25 DIAGNOSIS — R51 Headache: Secondary | ICD-10-CM | POA: Diagnosis present

## 2015-09-25 DIAGNOSIS — R5382 Chronic fatigue, unspecified: Secondary | ICD-10-CM | POA: Diagnosis not present

## 2015-09-25 DIAGNOSIS — I10 Essential (primary) hypertension: Secondary | ICD-10-CM | POA: Insufficient documentation

## 2015-09-25 DIAGNOSIS — I2510 Atherosclerotic heart disease of native coronary artery without angina pectoris: Secondary | ICD-10-CM | POA: Diagnosis not present

## 2015-09-25 DIAGNOSIS — Z79899 Other long term (current) drug therapy: Secondary | ICD-10-CM | POA: Diagnosis not present

## 2015-09-25 DIAGNOSIS — R079 Chest pain, unspecified: Secondary | ICD-10-CM | POA: Diagnosis not present

## 2015-09-25 DIAGNOSIS — Z87891 Personal history of nicotine dependence: Secondary | ICD-10-CM | POA: Insufficient documentation

## 2015-09-25 LAB — URINALYSIS, ROUTINE W REFLEX MICROSCOPIC
Glucose, UA: NEGATIVE mg/dL
Hgb urine dipstick: NEGATIVE
Ketones, ur: NEGATIVE mg/dL
Leukocytes, UA: NEGATIVE
Nitrite: NEGATIVE
Protein, ur: 30 mg/dL — AB
Specific Gravity, Urine: 1.022 (ref 1.005–1.030)
pH: 6 (ref 5.0–8.0)

## 2015-09-25 LAB — BASIC METABOLIC PANEL
ANION GAP: 10 (ref 5–15)
BUN: 15 mg/dL (ref 6–20)
CHLORIDE: 108 mmol/L (ref 101–111)
CO2: 22 mmol/L (ref 22–32)
CREATININE: 1.29 mg/dL — AB (ref 0.61–1.24)
Calcium: 9.5 mg/dL (ref 8.9–10.3)
GFR calc non Af Amer: 55 mL/min — ABNORMAL LOW (ref >60)
Glucose, Bld: 97 mg/dL (ref 65–99)
Potassium: 3.6 mmol/L (ref 3.5–5.1)
SODIUM: 140 mmol/L (ref 135–145)

## 2015-09-25 LAB — CBC
HCT: 36.6 % — ABNORMAL LOW (ref 39.0–52.0)
HEMOGLOBIN: 11.9 g/dL — AB (ref 13.0–17.0)
MCH: 31.4 pg (ref 26.0–34.0)
MCHC: 32.5 g/dL (ref 30.0–36.0)
MCV: 96.6 fL (ref 78.0–100.0)
PLATELETS: 450 10*3/uL — AB (ref 150–400)
RBC: 3.79 MIL/uL — AB (ref 4.22–5.81)
RDW: 16 % — ABNORMAL HIGH (ref 11.5–15.5)
WBC: 18.9 10*3/uL — AB (ref 4.0–10.5)

## 2015-09-25 LAB — URINE MICROSCOPIC-ADD ON: RBC / HPF: NONE SEEN RBC/hpf (ref 0–5)

## 2015-09-25 LAB — I-STAT TROPONIN, ED: TROPONIN I, POC: 0.04 ng/mL (ref 0.00–0.08)

## 2015-09-25 NOTE — ED Notes (Signed)
Unsuccessful IV attempt x 3.  Charge RN asked to attempt.

## 2015-09-25 NOTE — Discharge Instructions (Signed)
Please follow-up with your primary care doctor and inform them of today's visit. Please return immediately if any new or worsening signs or symptoms present.   Fatigue Fatigue is feeling tired all of the time, a lack of energy, or a lack of motivation. Occasional or mild fatigue is often a normal response to activity or life in general. However, long-lasting (chronic) or extreme fatigue may indicate an underlying medical condition. HOME CARE INSTRUCTIONS  Watch your fatigue for any changes. The following actions may help to lessen any discomfort you are feeling:  Talk to your health care provider about how much sleep you need each night. Try to get the required amount every night.  Take medicines only as directed by your health care provider.  Eat a healthy and nutritious diet. Ask your health care provider if you need help changing your diet.  Drink enough fluid to keep your urine clear or pale yellow.  Practice ways of relaxing, such as yoga, meditation, massage therapy, or acupuncture.  Exercise regularly.   Change situations that cause you stress. Try to keep your work and personal routine reasonable.  Do not abuse illegal drugs.  Limit alcohol intake to no more than 1 drink per day for nonpregnant women and 2 drinks per day for men. One drink equals 12 ounces of beer, 5 ounces of wine, or 1 ounces of hard liquor.  Take a multivitamin, if directed by your health care provider. SEEK MEDICAL CARE IF:   Your fatigue does not get better.  You have a fever.   You have unintentional weight loss or gain.  You have headaches.   You have difficulty:   Falling asleep.  Sleeping throughout the night.  You feel angry, guilty, anxious, or sad.   You are unable to have a bowel movement (constipation).   You skin is dry.   Your legs or another part of your body is swollen.  SEEK IMMEDIATE MEDICAL CARE IF:   You feel confused.   Your vision is blurry.  You feel  faint or pass out.   You have a severe headache.   You have severe abdominal, pelvic, or back pain.   You have chest pain, shortness of breath, or an irregular or fast heartbeat.   You are unable to urinate or you urinate less than normal.   You develop abnormal bleeding, such as bleeding from the rectum, vagina, nose, lungs, or nipples.  You vomit blood.   You have thoughts about harming yourself or committing suicide.   You are worried that you might harm someone else.    This information is not intended to replace advice given to you by your health care provider. Make sure you discuss any questions you have with your health care provider.   Document Released: 01/24/2007 Document Revised: 04/19/2014 Document Reviewed: 07/31/2013 Elsevier Interactive Patient Education [employer].

## 2015-09-25 NOTE — ED Notes (Signed)
Per PA, pt given a [place] sandwich and Sprite.

## 2015-09-25 NOTE — ED Notes (Signed)
Pt asked this writer to let the EDP know that he "wants to switch over from Percocet to Morphine."  Pt educated that we typically do not give narcotic prescriptions for chronic pain.  Pt reports "I have pain sometimes, because I had that cancer.  Will ya just explain it to him for me?"  [place] PA made aware.

## 2015-09-25 NOTE — ED Provider Notes (Signed)
CSN: [id number]     Arrival date & time 09/25/15  1444 History   First MD Initiated Contact with Patient 09/25/15 1527     Chief Complaint  Patient presents with  . Chest Pain    HPI   70 year old male presents today with headache complaints. Upon initial evaluation Patient does not have a specific complaint, continues with various stories and chronic conditions. Patient eventually reports that he has shortness of breath and chest pain. He notes that the shortness of breath and chest pain have been going on for an extended period of time, history of the same. He notes yesterday he had an episode of chest pain, none today. He reports chronic shortness of breath over the last 3 weeks with fatigue. Patient reports that he generally follows up with his cardiologist Dr. Elnoria Howard 1E who is not in town. Patient reports a dry nonproductive cough, this is been present for 3-4 months, unchanged. Patient presently denies any chest pain, abdominal pain, nausea or vomiting. Any lower extremity swelling or edema.  Denies any neurological deficits, headache, dizziness.  Past Medical History  Diagnosis Date  . Coronary artery disease   . Hypertension   . Hypercholesterolemia   . Stroke ([place])     No residual limb weakness.  Walks with cane at baseline.   Marland Kitchen TIA (transient ischemic attack) 05/10/2014  . Bell's palsy   . CML (chronic myelocytic leukemia) ([place])   . Leukemia [facility])    Past Surgical History  Procedure Laterality Date  . Back surgery    . Hip arthroplasty Right     orif  . Orif forearm fracture Right    Family History  Problem Relation Age of Onset  . Diabetes Mother   . Hypertension Mother   . Diabetes Father   . Hypertension Father   . Diabetes Brother   . Hypertension Brother   . Diabetes Sister   . Hypertension Sister   . Diabetes Brother   . Hypertension Brother   . Diabetes Sister   . Hypertension Sister    Social History  Substance Use Topics  . Smoking status: Former  Smoker -- 0.50 packs/day for 50 years    Types: Cigarettes  . Smokeless tobacco: Never Used  . Alcohol Use: 0.6 oz/week    1 Cans of beer per week     Comment: daily     Review of Systems  All other systems reviewed and are negative.   Allergies  Review of patient's allergies indicates no known allergies.  Home Medications   Prior to Admission medications   Medication Sig Start Date End Date Taking? Authorizing Provider  acetaminophen (TYLENOL) 325 MG tablet Take 2 tablets (650 mg total) by mouth every 4 (four) hours as needed for headache or mild pain. 06/06/15  Yes [name], MD  albuterol (PROVENTIL HFA;VENTOLIN HFA) 108 (90 BASE) MCG/ACT inhaler Inhale 2 puffs into the lungs every 4 (four) hours as needed for wheezing or shortness of breath. 08/08/14  Yes Noemi Chapel, MD  ALPRAZolam Duanne Moron) 0.25 MG tablet Take 0.25 mg by mouth 2 (two) times daily. 06/10/15  Yes Historical Provider, MD  aspirin 81 MG chewable tablet Chew 1 tablet (81 mg total) by mouth daily. 09/22/14  Yes Hosie Poisson, MD  atorvastatin (LIPITOR) 40 MG tablet Take 40 mg by mouth daily. 08/23/15  Yes Historical Provider, MD  carvedilol (COREG) 6.25 MG tablet Take 6.25 mg by mouth 2 (two) times daily.   Yes Historical Provider, MD  clopidogrel (  PLAVIX) 75 MG tablet Take 1 tablet (75 mg total) by mouth daily. 03/21/14  Yes [name], MD  digoxin (LANOXIN) 0.125 MG tablet Take 1 tablet (0.125 mg total) by mouth daily. 01/29/15  Yes Barton Dubois, MD  ENTRESTO 24-26 MG Take 1 tablet by mouth 2 (two) times daily.  08/05/15  Yes Historical Provider, MD  furosemide (LASIX) 80 MG tablet Take 80 mg by mouth daily as needed for fluid or edema.  03/27/15  Yes Historical Provider, MD  lisinopril (PRINIVIL,ZESTRIL) 5 MG tablet Take 5 mg by mouth daily. 08/23/15  Yes Historical Provider, MD  oxyCODONE-acetaminophen (PERCOCET/ROXICET) 5-325 MG tablet Take 2 tablets by mouth every 4 (four) hours as needed for severe pain. 06/24/15   Yes Chesley Noon Nadeau, PA-C  potassium chloride SA (K-DUR,KLOR-CON) 20 MEQ tablet Take 20 mEq by mouth daily. Reported on 09/05/2015 06/10/15  Yes Historical Provider, MD  carvedilol (COREG) 3.125 MG tablet Take 1 tablet (3.125 mg total) by mouth 2 (two) times daily with a meal. Patient not taking: Reported on 08/25/2015 06/06/15   [name], MD  isosorbide-hydrALAZINE (BIDIL) 20-37.5 MG tablet Take 0.5 tablets by mouth 2 (two) times daily. Patient not taking: Reported on 09/25/2015 01/29/15   Barton Dubois, MD   BP 139/86 mmHg  Pulse 92  Temp(Src) 98 F (36.7 C) (Oral)  Resp 14  SpO2 100% Physical Exam  Constitutional: He is oriented to person, place, and time. He appears well-developed and well-nourished.  HENT:  Head: Normocephalic and atraumatic.  Eyes: Conjunctivae are normal. Pupils are equal, round, and reactive to light. Right eye exhibits no discharge. Left eye exhibits no discharge. No scleral icterus.  Neck: Normal range of motion. No JVD present. No tracheal deviation present.  Pulmonary/Chest: Effort normal. No stridor.  Neurological: He is alert and oriented to person, place, and time. Coordination normal. ` Psychiatric: He has a normal mood and affect. His behavior is normal. Judgment and thought content normal.  Nursing note and vitals reviewed.   ED Course  Procedures (including critical care time) Labs Review Labs Reviewed  BASIC METABOLIC PANEL - Abnormal; Notable for the following:    Creatinine, Ser 1.29 (*)    GFR calc non Af Amer 55 (*)    All other components within normal limits  CBC - Abnormal; Notable for the following:    WBC 18.9 (*)    RBC 3.79 (*)    Hemoglobin 11.9 (*)    HCT 36.6 (*)    RDW 16.0 (*)    Platelets 450 (*)    All other components within normal limits  URINALYSIS, ROUTINE W REFLEX MICROSCOPIC (NOT AT [facility]) - Abnormal; Notable for the following:    Color, Urine AMBER (*)    Bilirubin Urine SMALL (*)    Protein, ur 30 (*)     All other components within normal limits  URINE MICROSCOPIC-ADD ON - Abnormal; Notable for the following:    Squamous Epithelial / LPF 0-5 (*)    Bacteria, UA RARE (*)    All other components within normal limits  I-STAT TROPOININ, ED    Imaging Review Dg Chest 2 View  09/25/2015  CLINICAL DATA:  Shortness of breath for 1 week, hypertension, smoker EXAM: CHEST  2 VIEW COMPARISON:  09/05/2015 FINDINGS: Cardiomegaly again noted. No acute infiltrate or pleural effusion. No pulmonary edema. Stable degenerative changes thoracic spine. IMPRESSION: No active cardiopulmonary disease. Electronically Signed   By: Lahoma Crocker M.D.   On: 09/25/2015 15:30  I have personally reviewed and evaluated these images and lab results as part of my medical decision-making.   EKG Interpretation   Date/Time:  Thursday September 25 2015 14:54:27 EDT Ventricular Rate:  91 PR Interval:  161 QRS Duration: 98 QT Interval:  401 QTC Calculation: 493 R Axis:   -32 Text Interpretation:  Sinus rhythm Left ventricular hypertrophy  Nonspecific T wave abnormality No significant change since last tracing  Confirmed by Ashok Cordia  MD, Lennette Bihari ([id number]) on 09/25/2015 3:39:35 PM      MDM   Final diagnoses:  Chronic fatigue    Labs:Urinalysis i-STAT troponin, BMP, CBC  Imaging: DG chest 2 view  Consults:  Therapeutics:  Discharge Meds:   Assessment/Plan: 70 year old male presents today with very vague complaints. Patient having fatigue, shortness of breath. Similar presentations, negative workup here, patient no distress, very low suspicion for ACS, PE, aneurysm. Patient will be discharged home with symptomatic care instructions, primary care follow-up. Strict return precautions given. Patient verbalized understanding and agreement to today's plan had no further questions or concerns.        Okey Regal, PA-C 09/25/15 2142  Lajean Saver, MD 09/25/15 [phone]

## 2015-09-25 NOTE — ED Notes (Addendum)
Patient c/o central chest pain that has been intermittent for couple days. Patient states that he thought it was something that he ate.  Patient states that he has HTN, high cholesterol, and smokes.  Patient states that his heart MD isn't "in this week as why I came here'.  Patient states that had leg swelling until he took his lasix.  Patient has cough with greenish phlegm.

## 2015-09-28 ENCOUNTER — Emergency Department (HOSPITAL_COMMUNITY)
Admission: EM | Admit: 2015-09-28 | Discharge: 2015-09-28 | Disposition: A | Discharge status: Home / Self Care | Payer: Medicare Other | Attending: Emergency Medicine | Admitting: Emergency Medicine

## 2015-09-28 ENCOUNTER — Other Ambulatory Visit: Payer: Self-pay

## 2015-09-28 ENCOUNTER — Emergency Department (HOSPITAL_COMMUNITY): Payer: Medicare Other

## 2015-09-28 ENCOUNTER — Encounter (HOSPITAL_COMMUNITY): Payer: Self-pay | Admitting: Emergency Medicine

## 2015-09-28 DIAGNOSIS — R05 Cough: Secondary | ICD-10-CM | POA: Diagnosis not present

## 2015-09-28 DIAGNOSIS — Z7982 Long term (current) use of aspirin: Secondary | ICD-10-CM | POA: Diagnosis not present

## 2015-09-28 DIAGNOSIS — Z87891 Personal history of nicotine dependence: Secondary | ICD-10-CM | POA: Insufficient documentation

## 2015-09-28 DIAGNOSIS — Z8673 Personal history of transient ischemic attack (TIA), and cerebral infarction without residual deficits: Secondary | ICD-10-CM | POA: Diagnosis not present

## 2015-09-28 DIAGNOSIS — I2510 Atherosclerotic heart disease of native coronary artery without angina pectoris: Secondary | ICD-10-CM | POA: Diagnosis not present

## 2015-09-28 DIAGNOSIS — I10 Essential (primary) hypertension: Secondary | ICD-10-CM | POA: Insufficient documentation

## 2015-09-28 DIAGNOSIS — Z79899 Other long term (current) drug therapy: Secondary | ICD-10-CM | POA: Insufficient documentation

## 2015-09-28 DIAGNOSIS — R059 Cough, unspecified: Secondary | ICD-10-CM

## 2015-09-28 DIAGNOSIS — R0602 Shortness of breath: Secondary | ICD-10-CM | POA: Diagnosis present

## 2015-09-28 LAB — BASIC METABOLIC PANEL
ANION GAP: 10 (ref 5–15)
BUN: 17 mg/dL (ref 6–20)
CALCIUM: 9.6 mg/dL (ref 8.9–10.3)
CO2: 20 mmol/L — AB (ref 22–32)
CREATININE: 1.27 mg/dL — AB (ref 0.61–1.24)
Chloride: 107 mmol/L (ref 101–111)
GFR calc Af Amer: >60 mL/min (ref >60)
GFR, EST NON AFRICAN AMERICAN: 56 mL/min — AB (ref >60)
GLUCOSE: 101 mg/dL — AB (ref 65–99)
Potassium: 4.3 mmol/L (ref 3.5–5.1)
Sodium: 137 mmol/L (ref 135–145)

## 2015-09-28 LAB — I-STAT TROPONIN, ED: Troponin i, poc: 0.05 ng/mL (ref 0.00–0.08)

## 2015-09-28 LAB — BRAIN NATRIURETIC PEPTIDE: B Natriuretic Peptide: 1482.2 pg/mL — ABNORMAL HIGH (ref 0.0–100.0)

## 2015-09-28 MED ORDER — GUAIFENESIN 100 MG/5ML PO LIQD: 100.0000 mg | ORAL | Status: DC | PRN

## 2015-09-28 NOTE — ED Notes (Signed)
Pt c/o SOB x 2 weeks. Hx leukemia. Pt reports taking oral chemo.

## 2015-09-28 NOTE — ED Provider Notes (Signed)
CSN: [id number]     Arrival date & time 09/28/15  1844 History   First MD Initiated Contact with Patient 09/28/15 1929     Chief Complaint  Patient presents with  . Shortness of Breath     (Consider location/radiation/quality/duration/timing/severity/associated sxs/prior Treatment) HPI MARQUIST DUMAINE is a 70 y.o. male with history of CML, coronary artery disease and TIA here for evaluation of shortness of breath. Patient reports intermittently over the past 2 weeks he has had shortness of breath. Denies any current. He also reports associated productive cough with yellow phlegm. Denies any fevers, chills, hemoptysis, abdominal pain, leg pain, headache or vision changes. He does report associated bilateral ankle swelling, but no leg swelling. Also reports overall weakness, but denies any focal numbness or weakness. Has not tried anything to improve his symptoms. Nothing makes the problem better or worse. No other modifying factors. Patient also requests PCP referral as well as dental referral.  Past Medical History  Diagnosis Date  . Coronary artery disease   . Hypertension   . Hypercholesterolemia   . Stroke ([place])     No residual limb weakness.  Walks with cane at baseline.   Marland Kitchen TIA (transient ischemic attack) 05/10/2014  . Bell's palsy   . CML (chronic myelocytic leukemia) ([place])   . Leukemia [facility])    Past Surgical History  Procedure Laterality Date  . Back surgery    . Hip arthroplasty Right     orif  . Orif forearm fracture Right    Family History  Problem Relation Age of Onset  . Diabetes Mother   . Hypertension Mother   . Diabetes Father   . Hypertension Father   . Diabetes Brother   . Hypertension Brother   . Diabetes Sister   . Hypertension Sister   . Diabetes Brother   . Hypertension Brother   . Diabetes Sister   . Hypertension Sister    Social History  Substance Use Topics  . Smoking status: Former Smoker -- 0.50 packs/day for 50 years    Types: Cigarettes   . Smokeless tobacco: Never Used  . Alcohol Use: 0.6 oz/week    1 Cans of beer per week     Comment: daily     Review of Systems A 10 point review of systems was completed and was negative except for pertinent positives and negatives as mentioned in the history of present illness    Allergies  Review of patient's allergies indicates no known allergies.  Home Medications   Prior to Admission medications   Medication Sig Start Date End Date Taking? Authorizing Provider  acetaminophen (TYLENOL) 325 MG tablet Take 2 tablets (650 mg total) by mouth every 4 (four) hours as needed for headache or mild pain. 06/06/15   [name], MD  albuterol (PROVENTIL HFA;VENTOLIN HFA) 108 (90 BASE) MCG/ACT inhaler Inhale 2 puffs into the lungs every 4 (four) hours as needed for wheezing or shortness of breath. 08/08/14   Noemi Chapel, MD  ALPRAZolam Duanne Moron) 0.25 MG tablet Take 0.25 mg by mouth 2 (two) times daily. 06/10/15   Historical Provider, MD  aspirin 81 MG chewable tablet Chew 1 tablet (81 mg total) by mouth daily. 09/22/14   Hosie Poisson, MD  atorvastatin (LIPITOR) 40 MG tablet Take 40 mg by mouth daily. 08/23/15   Historical Provider, MD  carvedilol (COREG) 3.125 MG tablet Take 1 tablet (3.125 mg total) by mouth 2 (two) times daily with a meal. Patient not taking: Reported on 08/25/2015 06/06/15  [name], MD  carvedilol (COREG) 6.25 MG tablet Take 6.25 mg by mouth 2 (two) times daily.    Historical Provider, MD  clopidogrel (PLAVIX) 75 MG tablet Take 1 tablet (75 mg total) by mouth daily. 03/21/14   [name], MD  digoxin (LANOXIN) 0.125 MG tablet Take 1 tablet (0.125 mg total) by mouth daily. 01/29/15   Barton Dubois, MD  ENTRESTO 24-26 MG Take 1 tablet by mouth 2 (two) times daily.  08/05/15   Historical Provider, MD  furosemide (LASIX) 80 MG tablet Take 80 mg by mouth daily as needed for fluid or edema.  03/27/15   Historical Provider, MD  guaiFENesin (ROBITUSSIN) 100 MG/5ML liquid Take  5-10 mLs (100-200 mg total) by mouth every 4 (four) hours as needed for cough. 09/28/15   Comer Locket, PA-C  isosorbide-hydrALAZINE (BIDIL) 20-37.5 MG tablet Take 0.5 tablets by mouth 2 (two) times daily. Patient not taking: Reported on 09/25/2015 01/29/15   Barton Dubois, MD  lisinopril (PRINIVIL,ZESTRIL) 5 MG tablet Take 5 mg by mouth daily. 08/23/15   Historical Provider, MD  oxyCODONE-acetaminophen (PERCOCET/ROXICET) 5-325 MG tablet Take 2 tablets by mouth every 4 (four) hours as needed for severe pain. 06/24/15   Nona Dell, PA-C  potassium chloride SA (K-DUR,KLOR-CON) 20 MEQ tablet Take 20 mEq by mouth daily. Reported on 09/05/2015 06/10/15   Historical Provider, MD   BP 131/82 mmHg  Pulse 94  Temp(Src) 98.2 F (36.8 C) (Oral)  Resp 18  SpO2 99% Physical Exam  Constitutional: He is oriented to person, place, and time. He appears well-developed and well-nourished.  Awake, alert and nontoxic in appearance  HENT:  Head: Normocephalic and atraumatic.  Mouth/Throat: Oropharynx is clear and moist.  Eyes: Conjunctivae are normal. Pupils are equal, round, and reactive to light. Right eye exhibits no discharge. Left eye exhibits no discharge. No scleral icterus.  Neck: Neck supple.  Cardiovascular: Normal rate, regular rhythm and normal heart sounds.   Pulmonary/Chest: Effort normal and breath sounds normal. No respiratory distress. He has no wheezes. He has no rales.  Persistently coughing during exam. No obvious adventitious lung sounds. No tachypnea, oxygen saturations 100% on room air. No evidence of respiratory distress  Abdominal: Soft. There is no tenderness.  Musculoskeletal: He exhibits no tenderness.  Neurological: He is alert and oriented to person, place, and time.  Cranial Nerves II-XII grossly intact  Skin: Skin is warm and dry. No rash noted.  Psychiatric: He has a normal mood and affect.  Nursing note and vitals reviewed.   ED Course  Procedures (including  critical care time) Labs Review Labs Reviewed  [place], ED    Imaging Review No results found. I have personally reviewed and evaluated these images and lab results as part of my medical decision-making.   EKG Interpretation   Date/Time:  Sunday September 28 2015 20:06:03 EDT Ventricular Rate:  86 PR Interval:    QRS Duration: 113 QT Interval:  415 QTC Calculation: 497 R Axis:   -29 Text Interpretation:  Sinus rhythm Ventricular premature complex Probable  left atrial enlargement Borderline intraventricular conduction delay  Nonspecific T abnormalities, lateral leads Borderline prolonged QT  interval Confirmed by Alvino Chapel  MD, Ovid Curd [phone]) on 09/28/2015 8:34:25  PM      MDM  Patient presents with multiple medical complaints, most concerned about intermittent productive cough with associated posttussive difficulty breathing. No chest pain. No difficulty breathing, shortness of breath and emergency department. Hemodynamically  stable, afebrile. Benign physical exam, other than patient coughing. We will obtain screening labs, chest x-ray and reassess. If no findings, patient will be discharged home with antitussive, instructions to follow-up with PCP. Referral given to PCP as well as dentist at his request. Patient care signed out to oncoming provider, Junius Creamer NP for follow-up on labs and imaging an subsequent reevaluation and disposition.. Final diagnoses:  Cough        Comer Locket, PA-C 09/28/15 2105  Davonna Belling, MD 09/28/15 [phone]

## 2015-09-28 NOTE — ED Provider Notes (Signed)
Viewed labs, x-ray, there were within parameters.  This patient's norm.  You will be discharged home, encouraged to follow-up with his PCP tomorrow  Junius Creamer, NP 09/28/15 [place], MD 09/28/15 [phone]

## 2015-09-28 NOTE — Discharge Instructions (Signed)
There does not appear to be an emergent cause for your symptoms at this time. Please follow-up with your doctor or use the attached resource guide to help find a doctor. Use your medications as prescribed to help with her cough. Return to ED for any new or worsening symptoms.  [facility] [employer] is a great source of information about community services available.  Access by dialing 2-1-1 from anywhere in [place], or by website -  Cust[employer]ian[employer]fi.   Other Local Resources (Updated 04/2015)  Dental  Care   Services    Phone Number and Address  Cost  [facility] For children 48 - 28 years of age:   Cleaning  Tooth brushing/flossing instruction  Sealants, fillings, crowns  Extractions  Emergency treatment  [phone] [address], [place] [address] Charges based on family income.  Medicaid and some insurance plans accepted.     [facility] - [facility], fillings, crowns  Extractions  Emergency treatment [phone] [address], [place]  Pregnant women 39 years of age or older with a Medicaid card  [facility] - [place]  Cleaning  Sealants, fillings, crowns  Extractions  Emergency treatment [phone] [address] [place], [place] Pregnant women 70 years of age or older with a Medicaid card  [facility] For children 75 - 13 years of age:   Cleaning  Tooth brushing/flossing instruction  Sealants, fillings, crowns  Extractions  Emergency treatment Limited orthodontic services for patients with Medicaid [phone], [place] [address] Medicaid and [facility] Health Choice cover for children up to age 34 and pregnant women.  Parents of children up to age 75 without Medicaid pay a reduced fee at time of  service.  [place] For children 20 - 52 years of age:   Cleaning  Tooth brushing/flossing instruction  Sealants, fillings, crowns  Extractions  Emergency treatment Limited orthodontic services for patients with Medicaid [phone] [place], [place].  Medicaid and [place] Health Choice cover for children up to age 23 and pregnant women.  Parents of children up to age 70 without Medicaid pay a reduced fee.  [facility]  Sealants, fillings, crowns  Extractions  Hours: Tuesdays and Thursdays, 4:15 - 8 pm [phone] [address] [place], [place] [address] Services free of charge to [facility] residents ages 18-64 who do not have health insurance, Medicare, [place], or [place] benefits and fall within federal poverty guidelines  [place] care in addition to primary medical care, nutritional counseling, and pharmacy:  Engineer, drilling, fillings, crowns  Extractions                  [phone] [facility], [place], [place], [place], [place], [place] [facility], [place], [place] [facility] [place], [place], [place], most insurance.  Also provides services available to all with fees adjusted based on ability to pay.    [facility]  Cleaning  Tooth  brushing/flossing instruction  Sealants, fillings, crowns  Extractions  Emergency treatment Hours: Tuesdays, Thursdays, and Fridays from 8 am to 5 pm by appointment only. [phone] [place], [place] [address] [facility]  residents with Medicaid (depending on eligibility) and children with [facility] Health Choice - call for more information.  [facility]  Extractions only  Hours: 2nd and 4th Thursday of each month from 6:30 am - 9 am.   [phone] ext. [address] [place], [place] [address] Ages 72 and older only.  Patients are seen on a first come, first served basis.  [employer]  [employer]  Extractions  Orthodontics  Endodontics  Implants/Crowns/Bridges  Complete and partial dentures [phone] [place], [place] Patients must complete an application for services.  There is often a waiting list.    Allergies An allergy is an abnormal reaction to a substance by the body's defense system (immune system). Allergies can develop at any age. WHAT CAUSES ALLERGIES? An allergic reaction happens when the immune system mistakenly reacts to a normally harmless substance, called an allergen, as if it were harmful. The immune system releases antibodies to fight the substance. Antibodies eventually release a chemical called histamine into the bloodstream. The release of histamine is meant to protect the body from infection, but it also causes discomfort. An allergic reaction can be triggered by:  Eating an allergen.  Inhaling an allergen.  Touching an allergen. WHAT TYPES OF ALLERGIES ARE THERE? There are many types of allergies. Common types include:  Seasonal allergies. People with this type of allergy are usually allergic to substances that are only present during certain seasons, such as molds and pollens.  Food allergies.  Drug allergies.  Insect allergies.  Animal dander allergies. WHAT ARE SYMPTOMS OF ALLERGIES? Possible allergy symptoms include:  Swelling of the lips, face, tongue, mouth, or throat.  Sneezing, coughing, or wheezing.  Nasal congestion.  Tingling in the mouth.  Rash.  Itching.  Itchy, red, swollen areas of skin  (hives).  Watery eyes.  Vomiting.  Diarrhea.  Dizziness.  Lightheadedness.  Fainting.  Trouble breathing or swallowing.  Chest tightness.  Rapid heartbeat. HOW ARE ALLERGIES DIAGNOSED? Allergies are diagnosed with a medical and family history and one or more of the following:  Skin tests.  Blood tests.  A food diary. A food diary is a record of all the foods and drinks you have in a day and of all the symptoms you experience.  The results of an elimination diet. An elimination diet involves eliminating foods from your diet and then adding them back in one by one to find out if a certain food causes an allergic reaction. HOW ARE ALLERGIES TREATED? There is no cure for allergies, but allergic reactions can be treated with medicine. Severe reactions usually need to be treated at a hospital. HOW CAN REACTIONS BE PREVENTED? The best way to prevent an allergic reaction is by avoiding the substance you are allergic to. Allergy shots and medicines can also help prevent reactions in some cases. People with severe allergic reactions may be able to prevent a life-threatening reaction called anaphylaxis with a medicine given right after exposure to the allergen.   This information is not intended to replace advice given to you by your health care provider. Make sure you discuss any questions you have with your health care provider.   Document Released: 06/22/2002 Document Revised: 04/19/2014 Document Reviewed: 01/08/2014 Elsevier Interactive Patient Education [employer].

## 2015-09-30 ENCOUNTER — Telehealth: Payer: Self-pay | Admitting: Hematology

## 2015-09-30 DIAGNOSIS — Z5900 Homelessness unspecified: Secondary | ICD-10-CM

## 2015-09-30 NOTE — Telephone Encounter (Signed)
pt came in to sch appt-stated missed last several appts-gave pt copy of avs

## 2015-10-03 ENCOUNTER — Encounter (HOSPITAL_COMMUNITY): Payer: Self-pay | Admitting: Emergency Medicine

## 2015-10-03 ENCOUNTER — Emergency Department (HOSPITAL_COMMUNITY): Payer: Medicare Other

## 2015-10-03 ENCOUNTER — Other Ambulatory Visit: Payer: Self-pay | Admitting: Hematology

## 2015-10-03 ENCOUNTER — Emergency Department (HOSPITAL_COMMUNITY)
Admission: EM | Admit: 2015-10-03 | Discharge: 2015-10-03 | Disposition: A | Discharge status: Home / Self Care | Payer: Medicare Other | Attending: Emergency Medicine | Admitting: Emergency Medicine

## 2015-10-03 DIAGNOSIS — Z8673 Personal history of transient ischemic attack (TIA), and cerebral infarction without residual deficits: Secondary | ICD-10-CM | POA: Diagnosis not present

## 2015-10-03 DIAGNOSIS — R059 Cough, unspecified: Secondary | ICD-10-CM

## 2015-10-03 DIAGNOSIS — R05 Cough: Secondary | ICD-10-CM | POA: Insufficient documentation

## 2015-10-03 DIAGNOSIS — Z7982 Long term (current) use of aspirin: Secondary | ICD-10-CM | POA: Insufficient documentation

## 2015-10-03 DIAGNOSIS — I10 Essential (primary) hypertension: Secondary | ICD-10-CM | POA: Insufficient documentation

## 2015-10-03 DIAGNOSIS — Z7951 Long term (current) use of inhaled steroids: Secondary | ICD-10-CM | POA: Insufficient documentation

## 2015-10-03 DIAGNOSIS — Z79899 Other long term (current) drug therapy: Secondary | ICD-10-CM | POA: Insufficient documentation

## 2015-10-03 DIAGNOSIS — I2510 Atherosclerotic heart disease of native coronary artery without angina pectoris: Secondary | ICD-10-CM | POA: Diagnosis not present

## 2015-10-03 DIAGNOSIS — R0602 Shortness of breath: Secondary | ICD-10-CM | POA: Diagnosis present

## 2015-10-03 LAB — CBC WITH DIFFERENTIAL/PLATELET
BASOS ABS: 0.3 10*3/uL — AB (ref 0.0–0.1)
BASOS PCT: 2 %
EOS PCT: 2 %
Eosinophils Absolute: 0.3 10*3/uL (ref 0.0–0.7)
HEMATOCRIT: 35.9 % — AB (ref 39.0–52.0)
HEMOGLOBIN: 11.7 g/dL — AB (ref 13.0–17.0)
LYMPHS ABS: 2 10*3/uL (ref 0.7–4.0)
LYMPHS PCT: 13 %
MCH: 31.9 pg (ref 26.0–34.0)
MCHC: 32.6 g/dL (ref 30.0–36.0)
MCV: 97.8 fL (ref 78.0–100.0)
MONOS PCT: 9 %
Monocytes Absolute: 1.4 10*3/uL — ABNORMAL HIGH (ref 0.1–1.0)
NEUTROS PCT: 74 %
Neutro Abs: 11.1 10*3/uL — ABNORMAL HIGH (ref 1.7–7.7)
Platelets: 460 10*3/uL — ABNORMAL HIGH (ref 150–400)
RBC: 3.67 MIL/uL — AB (ref 4.22–5.81)
RDW: 16.5 % — ABNORMAL HIGH (ref 11.5–15.5)
WBC: 15.1 10*3/uL — AB (ref 4.0–10.5)
nRBC: 1 /100 WBC — ABNORMAL HIGH

## 2015-10-03 LAB — BASIC METABOLIC PANEL
ANION GAP: 8 (ref 5–15)
BUN: 18 mg/dL (ref 6–20)
CHLORIDE: 107 mmol/L (ref 101–111)
CO2: 22 mmol/L (ref 22–32)
CREATININE: 1.23 mg/dL (ref 0.61–1.24)
Calcium: 9.3 mg/dL (ref 8.9–10.3)
GFR calc non Af Amer: 58 mL/min — ABNORMAL LOW (ref >60)
Glucose, Bld: 105 mg/dL — ABNORMAL HIGH (ref 65–99)
Potassium: 4.1 mmol/L (ref 3.5–5.1)
SODIUM: 137 mmol/L (ref 135–145)

## 2015-10-03 MED ORDER — BENZONATATE 100 MG PO CAPS
200.0000 mg | ORAL_CAPSULE | Freq: Once | ORAL | Status: AC
  Administered 2015-10-03: 200 mg via ORAL
  Filled 2015-10-03: qty 2

## 2015-10-03 NOTE — ED Notes (Signed)
Pt reports abd pain d/t hernia and SOB x 3 months.  Pt in NAD.  Was seen last week for same and states he lost his prescriptions, but recently just found them.

## 2015-10-03 NOTE — ED Provider Notes (Signed)
CSN: [id number]     Arrival date & time 10/03/15  0747 History   First MD Initiated Contact with Patient 10/03/15 0759     Chief Complaint  Patient presents with  . Shortness of Breath  . Abdominal Pain      HPI  Patient presents for evaluation of cough.   Has a history of CML, currently taking an unknown oral chemotherapy agent.. Also history of hypertension, CHF with EF of 10-15%, coronary disease, hypertension, anxiety, diabetes, COPD.  Patient was 12 your visits in the last 6 months. Many which are associated with some anxiety and requiring some reassurance. He states he has a persistent cough. Is not swollen in his legs. He is a heavy difficulty with speech or numbness or weakness. No chest pain preserved feel short of breath just feels a constant cough. Dry nonproductive. No shortness of breath. States he generally feels "pretty good".       Past Medical History  Diagnosis Date  . Coronary artery disease   . Hypertension   . Hypercholesterolemia   . Stroke ([place])     No residual limb weakness.  Walks with cane at baseline.   Marland Kitchen TIA (transient ischemic attack) 05/10/2014  . Bell's palsy   . CML (chronic myelocytic leukemia) ([place])   . Leukemia [facility])    Past Surgical History  Procedure Laterality Date  . Back surgery    . Hip arthroplasty Right     orif  . Orif forearm fracture Right    Family History  Problem Relation Age of Onset  . Diabetes Mother   . Hypertension Mother   . Diabetes Father   . Hypertension Father   . Diabetes Brother   . Hypertension Brother   . Diabetes Sister   . Hypertension Sister   . Diabetes Brother   . Hypertension Brother   . Diabetes Sister   . Hypertension Sister    Social History  Substance Use Topics  . Smoking status: Former Smoker -- 0.50 packs/day for 50 years    Types: Cigarettes  . Smokeless tobacco: Never Used  . Alcohol Use: 0.6 oz/week    1 Cans of beer per week     Comment: daily     Review of Systems   Constitutional: Negative for fever, chills, diaphoresis, appetite change and fatigue.  HENT: Negative for mouth sores, sore throat and trouble swallowing.   Eyes: Negative for visual disturbance.  Respiratory: Positive for cough. Negative for chest tightness, shortness of breath and wheezing.   Cardiovascular: Negative for chest pain.  Gastrointestinal: Negative for nausea, vomiting, abdominal pain, diarrhea and abdominal distention.  Endocrine: Negative for polydipsia, polyphagia and polyuria.  Genitourinary: Negative for dysuria, frequency and hematuria.  Musculoskeletal: Negative for gait problem.  Skin: Negative for color change, pallor and rash.  Neurological: Negative for dizziness, syncope, light-headedness and headaches.  Hematological: Does not bruise/bleed easily.  Psychiatric/Behavioral: Negative for behavioral problems and confusion.      Allergies  Review of patient's allergies indicates no known allergies.  Home Medications   Prior to Admission medications   Medication Sig Start Date End Date Taking? Authorizing Provider  acetaminophen (TYLENOL) 325 MG tablet Take 2 tablets (650 mg total) by mouth every 4 (four) hours as needed for headache or mild pain. 06/06/15  Yes [name], MD  albuterol (PROVENTIL HFA;VENTOLIN HFA) 108 (90 BASE) MCG/ACT inhaler Inhale 2 puffs into the lungs every 4 (four) hours as needed for wheezing or shortness of breath. 08/08/14  Yes Noemi Chapel, MD  ALPRAZolam Duanne Moron) 0.25 MG tablet Take 0.25 mg by mouth 2 (two) times daily. 06/10/15  Yes Historical Provider, MD  aspirin 81 MG chewable tablet Chew 1 tablet (81 mg total) by mouth daily. 09/22/14  Yes Hosie Poisson, MD  atorvastatin (LIPITOR) 40 MG tablet Take 40 mg by mouth daily. 08/23/15  Yes Historical Provider, MD  carvedilol (COREG) 6.25 MG tablet Take 6.25 mg by mouth 2 (two) times daily.   Yes Historical Provider, MD  clopidogrel (PLAVIX) 75 MG tablet Take 1 tablet (75 mg total) by mouth  daily. 03/21/14  Yes [name], MD  digoxin (LANOXIN) 0.125 MG tablet Take 1 tablet (0.125 mg total) by mouth daily. 01/29/15  Yes Barton Dubois, MD  ENTRESTO 24-26 MG Take 1 tablet by mouth 2 (two) times daily.  08/05/15  Yes Historical Provider, MD  furosemide (LASIX) 80 MG tablet Take 80 mg by mouth daily as needed for fluid or edema.  03/27/15  Yes Historical Provider, MD  lisinopril (PRINIVIL,ZESTRIL) 5 MG tablet Take 5 mg by mouth daily. 08/23/15  Yes Historical Provider, MD  oxyCODONE-acetaminophen (PERCOCET/ROXICET) 5-325 MG tablet Take 2 tablets by mouth every 4 (four) hours as needed for severe pain. 06/24/15  Yes Nona Dell, PA-C  carvedilol (COREG) 3.125 MG tablet Take 1 tablet (3.125 mg total) by mouth 2 (two) times daily with a meal. Patient not taking: Reported on 10/03/2015 06/06/15   [name], MD  dasatinib (SPRYCEL) 100 MG tablet Take 100 mg by mouth daily.    Historical Provider, MD  guaiFENesin (ROBITUSSIN) 100 MG/5ML liquid Take 5-10 mLs (100-200 mg total) by mouth every 4 (four) hours as needed for cough. Patient not taking: Reported on 10/03/2015 09/28/15   Comer Locket, PA-C  isosorbide-hydrALAZINE (BIDIL) 20-37.5 MG tablet Take 0.5 tablets by mouth 2 (two) times daily. Patient not taking: Reported on 09/25/2015 01/29/15   Barton Dubois, MD  potassium chloride SA (K-DUR,KLOR-CON) 20 MEQ tablet Take 20 mEq by mouth daily. Reported on 10/03/2015 06/10/15   Historical Provider, MD   BP 138/101 mmHg  Pulse 90  Temp(Src) 97.7 F (36.5 C) (Oral)  Resp 18  SpO2 100% Physical Exam  Constitutional: He is oriented to person, place, and time. He appears well-developed and well-nourished. No distress.  HENT:  Head: Normocephalic.  Eyes: Conjunctivae are normal. Pupils are equal, round, and reactive to light. No scleral icterus.  Neck: Normal range of motion. Neck supple. No thyromegaly present.  Cardiovascular: Normal rate and regular rhythm.  Exam reveals no  gallop and no friction rub.   No murmur heard. Pulmonary/Chest: Effort normal and breath sounds normal. No respiratory distress. He has no wheezes. He has no rales.  Clear bilateral breath sounds. Wheezing or rhonchi. Frequent cough. No JVD. No actually swelling.  Abdominal: Soft. Bowel sounds are normal. He exhibits no distension. There is no tenderness. There is no rebound.  Musculoskeletal: Normal range of motion.  Neurological: He is alert and oriented to person, place, and time.  Skin: Skin is warm and dry. No rash noted.  Psychiatric: He has a normal mood and affect. His behavior is normal.    ED Course  Procedures (including critical care time) Labs Review Labs Reviewed  CBC WITH DIFFERENTIAL/PLATELET - Abnormal; Notable for the following:    WBC 15.1 (*)    RBC 3.67 (*)    Hemoglobin 11.7 (*)    HCT 35.9 (*)    RDW 16.5 (*)    Platelets 460 (*)  nRBC 1 (*)    Neutro Abs 11.1 (*)    Monocytes Absolute 1.4 (*)    Basophils Absolute 0.3 (*)    All other components within normal limits  BASIC METABOLIC PANEL - Abnormal; Notable for the following:    Glucose, Bld 105 (*)    GFR calc non Af Amer 58 (*)    All other components within normal limits    Imaging Review Dg Chest 2 View  10/03/2015  CLINICAL DATA:  Increased cough, congestion, sob for approx 1 month, states hx of leukemia, taking oral chemo EXAM: CHEST  2 VIEW COMPARISON:  09/28/2015 FINDINGS: Moderate thoracic spondylosis. Midline trachea. Mild cardiomegaly. Mediastinal contours otherwise within normal limits. No pleural effusion or pneumothorax. Mild biapical pleural thickening. Clear lungs. No congestive failure. IMPRESSION: Cardiomegaly, without congestive failure or acute disease. Electronically Signed   By: Abigail Miyamoto M.D.   On: 10/03/2015 09:24   I have personally reviewed and evaluated these images and lab results as part of my medical decision-making.   EKG Interpretation None      MDM   Final  diagnoses:  Cough    Patient with a normal x-ray of the [facility]. No signs of failure or infiltrate. He is a leukocytosis of 15,000 which seems to be typical for him. He is not neutropenic. He is not febrile or hypoxemic. He is sleeping as I go to reexamine him. His respiratory normally is no increased worker breathing continues with normal lungs. He states he "just found" his prescription in for an unknown cough medicine at home states he'll fill today.    Tanna Furry, MD 10/03/15 1040

## 2015-10-03 NOTE — ED Notes (Signed)
Taking oral chemo. C/o SOB on-going x 1 month, as well as pain from an abdominal hernia x 1 month. Pain 10/10

## 2015-10-03 NOTE — Discharge Instructions (Signed)

## 2015-10-06 ENCOUNTER — Other Ambulatory Visit: Payer: Self-pay | Admitting: Hematology

## 2015-10-06 DIAGNOSIS — C9210 Chronic myeloid leukemia, BCR/ABL-positive, not having achieved remission: Secondary | ICD-10-CM

## 2015-10-09 ENCOUNTER — Encounter (HOSPITAL_COMMUNITY): Payer: Self-pay | Admitting: Emergency Medicine

## 2015-10-09 ENCOUNTER — Emergency Department (HOSPITAL_COMMUNITY)
Admission: EM | Admit: 2015-10-09 | Discharge: 2015-10-09 | Disposition: A | Discharge status: Home / Self Care | Payer: Medicare Other | Source: Home / Self Care | Attending: Emergency Medicine | Admitting: Emergency Medicine

## 2015-10-09 DIAGNOSIS — R0602 Shortness of breath: Secondary | ICD-10-CM | POA: Diagnosis not present

## 2015-10-09 DIAGNOSIS — N182 Chronic kidney disease, stage 2 (mild): Secondary | ICD-10-CM | POA: Insufficient documentation

## 2015-10-09 DIAGNOSIS — R05 Cough: Secondary | ICD-10-CM

## 2015-10-09 DIAGNOSIS — L720 Epidermal cyst: Secondary | ICD-10-CM

## 2015-10-09 DIAGNOSIS — I129 Hypertensive chronic kidney disease with stage 1 through stage 4 chronic kidney disease, or unspecified chronic kidney disease: Secondary | ICD-10-CM

## 2015-10-09 DIAGNOSIS — Z8673 Personal history of transient ischemic attack (TIA), and cerebral infarction without residual deficits: Secondary | ICD-10-CM

## 2015-10-09 DIAGNOSIS — I2510 Atherosclerotic heart disease of native coronary artery without angina pectoris: Secondary | ICD-10-CM

## 2015-10-09 DIAGNOSIS — Z79899 Other long term (current) drug therapy: Secondary | ICD-10-CM | POA: Insufficient documentation

## 2015-10-09 DIAGNOSIS — I130 Hypertensive heart and chronic kidney disease with heart failure and stage 1 through stage 4 chronic kidney disease, or unspecified chronic kidney disease: Secondary | ICD-10-CM | POA: Diagnosis not present

## 2015-10-09 DIAGNOSIS — Z7982 Long term (current) use of aspirin: Secondary | ICD-10-CM | POA: Insufficient documentation

## 2015-10-09 DIAGNOSIS — R059 Cough, unspecified: Secondary | ICD-10-CM

## 2015-10-09 DIAGNOSIS — Z87891 Personal history of nicotine dependence: Secondary | ICD-10-CM | POA: Insufficient documentation

## 2015-10-09 MED ORDER — ALBUTEROL SULFATE (2.5 MG/3ML) 0.083% IN NEBU
5.0000 mg | INHALATION_SOLUTION | Freq: Once | RESPIRATORY_TRACT | Status: AC
  Administered 2015-10-09: 5 mg via RESPIRATORY_TRACT
  Filled 2015-10-09: qty 6

## 2015-10-09 MED ORDER — ALBUTEROL SULFATE HFA 108 (90 BASE) MCG/ACT IN AERS
2.0000 | INHALATION_SPRAY | Freq: Once | RESPIRATORY_TRACT | Status: AC
  Administered 2015-10-09: 2 via RESPIRATORY_TRACT
  Filled 2015-10-09: qty 6.7

## 2015-10-09 MED ORDER — DEXAMETHASONE SODIUM PHOSPHATE 10 MG/ML IJ SOLN
10.0000 mg | Freq: Once | INTRAMUSCULAR | Status: AC
  Administered 2015-10-09: 10 mg via INTRAMUSCULAR
  Filled 2015-10-09: qty 1

## 2015-10-09 MED ORDER — BENZONATATE 100 MG PO CAPS: 200.0000 mg | ORAL_CAPSULE | Freq: Two times a day (BID) | ORAL | Status: DC | PRN

## 2015-10-09 NOTE — ED Notes (Signed)
Pt reports a cyst on the back of his neck for the past several months. Pt also complains of ongoing cough and feeling of sob for the past 3 month. Was seen here for same 6 days ago. Report symptoms are not better after taking robutusin. Pt speaking in complete sentences with no apparent difficulty.

## 2015-10-09 NOTE — ED Provider Notes (Signed)
CSN: [id number]     Arrival date & time 10/09/15  [id number] History   First MD Initiated Contact with Patient 10/09/15 [phone]     Chief Complaint  Patient presents with  . Cyst  . Cough     (Consider location/radiation/quality/duration/timing/severity/associated sxs/prior Treatment) HPI   Patient is a 70 year old male with past medical history of CML, hypertension, CHF (EF 10-15%), CAD, DM, COPD who presents to the ED with multiple complaints. Patient reports he has had a productive cough (clear sputum) with associated intermittent shortness of breath for the past 3 months. Patient states he has been seen in the ED twice over the past few weeks for his symptoms and notes he has not had relief with Robitussin at home. Endorses associated rhinorrhea, mild wheezing. Denies fever, chills, headache, visual changes, lightheadedness, dizziness, sinus pressure, sore throat, hemoptysis, chest pain, palpitations, nausea, vomiting, abdominal pain, leg swelling. Patient denies taking any other medications at home. Prior smoker (1/2 PPD x50 yrs)  Patient also reports he has had a small cyst to his left upper back for the past 12 months. He notes it has slightly increased in size over the past few weeks. Denies associated pain, redness, swelling or drainage. Patient reports he has not been evaluated regarding this cyst in the past but notes it was bothering him and has not resolved so he wanted someone to take a look at it today. Denies neck or back pain. Denies numbness, tingling, weakness.  Past Medical History  Diagnosis Date  . Coronary artery disease   . Hypertension   . Hypercholesterolemia   . Stroke ([place])     No residual limb weakness.  Walks with cane at baseline.   Marland Kitchen TIA (transient ischemic attack) 05/10/2014  . Bell's palsy   . CML (chronic myelocytic leukemia) ([place])   . Leukemia [facility])    Past Surgical History  Procedure Laterality Date  . Back surgery    . Hip arthroplasty Right     orif  .  Orif forearm fracture Right    Family History  Problem Relation Age of Onset  . Diabetes Mother   . Hypertension Mother   . Diabetes Father   . Hypertension Father   . Diabetes Brother   . Hypertension Brother   . Diabetes Sister   . Hypertension Sister   . Diabetes Brother   . Hypertension Brother   . Diabetes Sister   . Hypertension Sister    Social History  Substance Use Topics  . Smoking status: Former Smoker -- 0.50 packs/day for 50 years    Types: Cigarettes  . Smokeless tobacco: Never Used  . Alcohol Use: 0.6 oz/week    1 Cans of beer per week     Comment: daily     Review of Systems  HENT: Positive for rhinorrhea.   Respiratory: Positive for cough and wheezing.   Skin:       Upper back cyst  All other systems reviewed and are negative.     Allergies  Review of patient's allergies indicates no known allergies.  Home Medications   Prior to Admission medications   Medication Sig Start Date End Date Taking? Authorizing Provider  acetaminophen (TYLENOL) 325 MG tablet Take 2 tablets (650 mg total) by mouth every 4 (four) hours as needed for headache or mild pain. 06/06/15   [name], MD  albuterol (PROVENTIL HFA;VENTOLIN HFA) 108 (90 BASE) MCG/ACT inhaler Inhale 2 puffs into the lungs every 4 (four) hours as needed for wheezing  or shortness of breath. 08/08/14   Noemi Chapel, MD  ALPRAZolam Duanne Moron) 0.25 MG tablet Take 0.25 mg by mouth 2 (two) times daily. 06/10/15   Historical Provider, MD  aspirin 81 MG chewable tablet Chew 1 tablet (81 mg total) by mouth daily. 09/22/14   Hosie Poisson, MD  atorvastatin (LIPITOR) 40 MG tablet Take 40 mg by mouth daily. 08/23/15   Historical Provider, MD  benzonatate (TESSALON) 100 MG capsule Take 2 capsules (200 mg total) by mouth 2 (two) times daily as needed for cough. 10/09/15   Nona Dell, PA-C  carvedilol (COREG) 3.125 MG tablet Take 1 tablet (3.125 mg total) by mouth 2 (two) times daily with a meal. Patient not  taking: Reported on 10/03/2015 06/06/15   [name], MD  carvedilol (COREG) 6.25 MG tablet Take 6.25 mg by mouth 2 (two) times daily.    Historical Provider, MD  clopidogrel (PLAVIX) 75 MG tablet Take 1 tablet (75 mg total) by mouth daily. 03/21/14   [name], MD  dasatinib (SPRYCEL) 100 MG tablet Take 100 mg by mouth daily.    Historical Provider, MD  digoxin (LANOXIN) 0.125 MG tablet Take 1 tablet (0.125 mg total) by mouth daily. 01/29/15   Barton Dubois, MD  ENTRESTO 24-26 MG Take 1 tablet by mouth 2 (two) times daily.  08/05/15   Historical Provider, MD  furosemide (LASIX) 80 MG tablet Take 80 mg by mouth daily as needed for fluid or edema.  03/27/15   Historical Provider, MD  guaiFENesin (ROBITUSSIN) 100 MG/5ML liquid Take 5-10 mLs (100-200 mg total) by mouth every 4 (four) hours as needed for cough. Patient not taking: Reported on 10/03/2015 09/28/15   Comer Locket, PA-C  isosorbide-hydrALAZINE (BIDIL) 20-37.5 MG tablet Take 0.5 tablets by mouth 2 (two) times daily. Patient not taking: Reported on 09/25/2015 01/29/15   Barton Dubois, MD  lisinopril (PRINIVIL,ZESTRIL) 5 MG tablet Take 5 mg by mouth daily. 08/23/15   Historical Provider, MD  oxyCODONE-acetaminophen (PERCOCET/ROXICET) 5-325 MG tablet Take 2 tablets by mouth every 4 (four) hours as needed for severe pain. 06/24/15   Nona Dell, PA-C  potassium chloride SA (K-DUR,KLOR-CON) 20 MEQ tablet Take 20 mEq by mouth daily. Reported on 10/03/2015 06/10/15   Historical Provider, MD   BP 121/87 mmHg  Pulse 87  Temp(Src) 97.5 F (36.4 C) (Oral)  Resp 16  SpO2 95% Physical Exam  Constitutional: He is oriented to person, place, and time. He appears well-developed and well-nourished. No distress.  Pt coughing intermittently throughout exam  HENT:  Head: Normocephalic and atraumatic.  Right Ear: Tympanic membrane normal.  Left Ear: Tympanic membrane normal.  Nose: Rhinorrhea present. Right sinus exhibits frontal sinus  tenderness. Right sinus exhibits no maxillary sinus tenderness. Left sinus exhibits frontal sinus tenderness. Left sinus exhibits no maxillary sinus tenderness.  Mouth/Throat: Uvula is midline, oropharynx is clear and moist and mucous membranes are normal. No oropharyngeal exudate, posterior oropharyngeal edema, posterior oropharyngeal erythema or tonsillar abscesses.  Eyes: Conjunctivae and EOM are normal. Pupils are equal, round, and reactive to light. Right eye exhibits no discharge. Left eye exhibits no discharge. No scleral icterus.  Neck: Normal range of motion. Neck supple.  Cardiovascular: Normal rate, regular rhythm, normal heart sounds and intact distal pulses.   Pulmonary/Chest: Effort normal and breath sounds normal. No respiratory distress. He has no wheezes. He has no rales. He exhibits no tenderness.  Mildly decreased breath sounds throughout.  Abdominal: Soft. He exhibits no distension.  Musculoskeletal: Normal  range of motion. He exhibits no edema.  No C, T, L midline tenderness. ROM of neck and pain. 1cm cyst-like mass palpated to the left of the upper thoracic back, no surrounding swelling or erythema, no fluctuance, no drainage, nontender.   Lymphadenopathy:    He has no cervical adenopathy.  Neurological: He is alert and oriented to person, place, and time. He has normal strength. No sensory deficit.  Skin: Skin is warm and dry. He is not diaphoretic.  Nursing note and vitals reviewed.   ED Course  Procedures (including critical care time) Labs Review Labs Reviewed - No data to display  Imaging Review No results found. I have personally reviewed and evaluated these images and lab results as part of my medical decision-making.   EKG Interpretation None      MDM   Final diagnoses:  Cough  Epidermoid cyst   Patient presents with cough and intermittent shortness of breath that he has had for the past 3 months. He also reports having a small cyst on his left upper  back for the past year. History of CML, CHF, COPD. VSS. On exam patient coughing intermittently throughout exam, lungs clear to auscultation bilaterally with mildly decreased breath sounds noted throughout. 1 cm cystlike mass palpated to left upper thoracic back, consistent with epidermoid cyst. Remaining exam unremarkable. Chart review shows patient was seen on 6/18 and 6/23 for similar symptoms. Chest x-ray on 6/23 showed cardiomegaly. Discussed pt with Dr. Alfonse Spruce who evaluated the pt. Plan to given pt albuterol and steroids and order CXR and labs for further evaluation due to pt's co-morbidites however pt refused any imaging or labs at this time due just having them done a few days ago. Expressed my concern for wanting the labs and imaging due to his PMH, pt reported understanding but continued to refuse. On reevaluation status post neb treatment, patient reports he is feeling better. Exam revealed lungs clear auscultation with good breath sounds bilaterally. Plan to discharge patient home with albuterol inhaler and symptomatically treatment. Advised patient to follow up with his PCP within the next week for follow up. Discussed her crit from precautions with patient.     Chesley Noon [place], [place] 10/09/15 1006  Harvel Quale, MD 10/11/15 Reece Agar

## 2015-10-09 NOTE — Discharge Instructions (Signed)
Take your medications as prescribed as needed for your cough. I also recommend using your inhaler as prescribed as needed for your breathing. Continue taking your home meds as prescribed.  Follow up with your primary care provider within the next week for follow-up and further management of your cyst. Return to the emergency department if symptoms worsen or new onset fever, difficulty breathing, coughing up blood, chest pain, palpitations, vomiting, abdominal pain, lightheadedness, dizziness, syncope.

## 2015-10-10 ENCOUNTER — Emergency Department (HOSPITAL_COMMUNITY): Payer: Medicare Other

## 2015-10-10 ENCOUNTER — Telehealth: Payer: Self-pay | Admitting: *Deleted

## 2015-10-10 ENCOUNTER — Encounter: Payer: Self-pay | Admitting: Hematology

## 2015-10-10 ENCOUNTER — Other Ambulatory Visit: Payer: Medicare Other

## 2015-10-10 ENCOUNTER — Encounter (HOSPITAL_COMMUNITY): Payer: Self-pay | Admitting: Emergency Medicine

## 2015-10-10 ENCOUNTER — Inpatient Hospital Stay (HOSPITAL_COMMUNITY)
Admission: EM | Admit: 2015-10-10 | Discharge: 2015-10-15 | DRG: 291 | Disposition: A | Discharge status: Home / Self Care | Payer: Medicare Other | Attending: Cardiology | Admitting: Cardiology

## 2015-10-10 ENCOUNTER — Encounter: Payer: Medicare Other | Admitting: Hematology

## 2015-10-10 DIAGNOSIS — I428 Other cardiomyopathies: Secondary | ICD-10-CM | POA: Diagnosis present

## 2015-10-10 DIAGNOSIS — R479 Unspecified speech disturbances: Secondary | ICD-10-CM

## 2015-10-10 DIAGNOSIS — Z9114 Patient's other noncompliance with medication regimen: Secondary | ICD-10-CM

## 2015-10-10 DIAGNOSIS — R1084 Generalized abdominal pain: Secondary | ICD-10-CM | POA: Diagnosis not present

## 2015-10-10 DIAGNOSIS — I5023 Acute on chronic systolic (congestive) heart failure: Secondary | ICD-10-CM | POA: Diagnosis present

## 2015-10-10 DIAGNOSIS — Z833 Family history of diabetes mellitus: Secondary | ICD-10-CM | POA: Diagnosis not present

## 2015-10-10 DIAGNOSIS — F1010 Alcohol abuse, uncomplicated: Secondary | ICD-10-CM | POA: Diagnosis present

## 2015-10-10 DIAGNOSIS — Z96641 Presence of right artificial hip joint: Secondary | ICD-10-CM | POA: Diagnosis present

## 2015-10-10 DIAGNOSIS — Z87891 Personal history of nicotine dependence: Secondary | ICD-10-CM

## 2015-10-10 DIAGNOSIS — G459 Transient cerebral ischemic attack, unspecified: Secondary | ICD-10-CM | POA: Diagnosis present

## 2015-10-10 DIAGNOSIS — Z7982 Long term (current) use of aspirin: Secondary | ICD-10-CM | POA: Diagnosis not present

## 2015-10-10 DIAGNOSIS — Z590 Homelessness: Secondary | ICD-10-CM

## 2015-10-10 DIAGNOSIS — Z8673 Personal history of transient ischemic attack (TIA), and cerebral infarction without residual deficits: Secondary | ICD-10-CM | POA: Diagnosis not present

## 2015-10-10 DIAGNOSIS — I5020 Unspecified systolic (congestive) heart failure: Secondary | ICD-10-CM

## 2015-10-10 DIAGNOSIS — N182 Chronic kidney disease, stage 2 (mild): Secondary | ICD-10-CM | POA: Diagnosis present

## 2015-10-10 DIAGNOSIS — R740 Nonspecific elevation of levels of transaminase and lactic acid dehydrogenase [LDH]: Secondary | ICD-10-CM | POA: Diagnosis present

## 2015-10-10 DIAGNOSIS — I248 Other forms of acute ischemic heart disease: Secondary | ICD-10-CM | POA: Diagnosis present

## 2015-10-10 DIAGNOSIS — C9210 Chronic myeloid leukemia, BCR/ABL-positive, not having achieved remission: Secondary | ICD-10-CM | POA: Diagnosis present

## 2015-10-10 DIAGNOSIS — R112 Nausea with vomiting, unspecified: Secondary | ICD-10-CM | POA: Diagnosis present

## 2015-10-10 DIAGNOSIS — R109 Unspecified abdominal pain: Secondary | ICD-10-CM

## 2015-10-10 DIAGNOSIS — I130 Hypertensive heart and chronic kidney disease with heart failure and stage 1 through stage 4 chronic kidney disease, or unspecified chronic kidney disease: Secondary | ICD-10-CM | POA: Diagnosis present

## 2015-10-10 DIAGNOSIS — Z8249 Family history of ischemic heart disease and other diseases of the circulatory system: Secondary | ICD-10-CM

## 2015-10-10 DIAGNOSIS — I6930 Unspecified sequelae of cerebral infarction: Secondary | ICD-10-CM

## 2015-10-10 DIAGNOSIS — Z7902 Long term (current) use of antithrombotics/antiplatelets: Secondary | ICD-10-CM

## 2015-10-10 DIAGNOSIS — R7989 Other specified abnormal findings of blood chemistry: Secondary | ICD-10-CM | POA: Diagnosis present

## 2015-10-10 DIAGNOSIS — I2510 Atherosclerotic heart disease of native coronary artery without angina pectoris: Secondary | ICD-10-CM | POA: Diagnosis present

## 2015-10-10 DIAGNOSIS — R778 Other specified abnormalities of plasma proteins: Secondary | ICD-10-CM | POA: Diagnosis present

## 2015-10-10 DIAGNOSIS — R0602 Shortness of breath: Secondary | ICD-10-CM | POA: Diagnosis present

## 2015-10-10 DIAGNOSIS — E785 Hyperlipidemia, unspecified: Secondary | ICD-10-CM | POA: Diagnosis present

## 2015-10-10 DIAGNOSIS — I10 Essential (primary) hypertension: Secondary | ICD-10-CM | POA: Diagnosis present

## 2015-10-10 HISTORY — DX: Chronic kidney disease, stage 2 (mild): N18.2

## 2015-10-10 LAB — CBC
HEMATOCRIT: 38.1 % — AB (ref 39.0–52.0)
Hemoglobin: 12.6 g/dL — ABNORMAL LOW (ref 13.0–17.0)
MCH: 32 pg (ref 26.0–34.0)
MCHC: 33.1 g/dL (ref 30.0–36.0)
MCV: 96.7 fL (ref 78.0–100.0)
PLATELETS: 524 10*3/uL — AB (ref 150–400)
RBC: 3.94 MIL/uL — ABNORMAL LOW (ref 4.22–5.81)
RDW: 16.7 % — AB (ref 11.5–15.5)
WBC: 27 10*3/uL — ABNORMAL HIGH (ref 4.0–10.5)

## 2015-10-10 LAB — BASIC METABOLIC PANEL
Anion gap: 10 (ref 5–15)
BUN: 30 mg/dL — ABNORMAL HIGH (ref 6–20)
CHLORIDE: 104 mmol/L (ref 101–111)
CO2: 21 mmol/L — AB (ref 22–32)
CREATININE: 1.4 mg/dL — AB (ref 0.61–1.24)
Calcium: 9.7 mg/dL (ref 8.9–10.3)
GFR calc Af Amer: 58 mL/min — ABNORMAL LOW (ref >60)
GFR calc non Af Amer: 50 mL/min — ABNORMAL LOW (ref >60)
GLUCOSE: 129 mg/dL — AB (ref 65–99)
POTASSIUM: 5.1 mmol/L (ref 3.5–5.1)
Sodium: 135 mmol/L (ref 135–145)

## 2015-10-10 LAB — HEPATIC FUNCTION PANEL
ALBUMIN: 3.9 g/dL (ref 3.5–5.0)
ALK PHOS: 6 U/L — AB (ref 38–126)
ALT: 20 U/L (ref 17–63)
AST: 32 U/L (ref 15–41)
Bilirubin, Direct: 1.3 mg/dL — ABNORMAL HIGH (ref 0.1–0.5)
Indirect Bilirubin: 1.5 mg/dL — ABNORMAL HIGH (ref 0.3–0.9)
TOTAL PROTEIN: 7.6 g/dL (ref 6.5–8.1)
Total Bilirubin: 2.8 mg/dL — ABNORMAL HIGH (ref 0.3–1.2)

## 2015-10-10 LAB — DIGOXIN LEVEL

## 2015-10-10 LAB — BRAIN NATRIURETIC PEPTIDE: B NATRIURETIC PEPTIDE 5: 4251.7 pg/mL — AB (ref 0.0–100.0)

## 2015-10-10 LAB — I-STAT TROPONIN, ED: Troponin i, poc: 0.05 ng/mL (ref 0.00–0.08)

## 2015-10-10 LAB — MAGNESIUM: MAGNESIUM: 2.2 mg/dL (ref 1.7–2.4)

## 2015-10-10 MED ORDER — ASPIRIN 81 MG PO CHEW
81.0000 mg | CHEWABLE_TABLET | Freq: Every day | ORAL | Status: DC
  Administered 2015-10-11 – 2015-10-15 (×5): 81 mg via ORAL
  Filled 2015-10-10 (×5): qty 1

## 2015-10-10 MED ORDER — MORPHINE SULFATE (PF) 2 MG/ML IV SOLN
2.0000 mg | INTRAVENOUS | Status: DC | PRN
  Administered 2015-10-11: 2 mg via INTRAVENOUS
  Filled 2015-10-10: qty 1

## 2015-10-10 MED ORDER — ONDANSETRON HCL 4 MG/2ML IJ SOLN: 4.0000 mg | Freq: Three times a day (TID) | INTRAMUSCULAR | Status: DC | PRN

## 2015-10-10 MED ORDER — OXYCODONE-ACETAMINOPHEN 5-325 MG PO TABS
2.0000 | ORAL_TABLET | ORAL | Status: DC | PRN
  Administered 2015-10-14 – 2015-10-15 (×2): 2 via ORAL
  Filled 2015-10-10 (×2): qty 2

## 2015-10-10 MED ORDER — FUROSEMIDE 10 MG/ML IJ SOLN
60.0000 mg | Freq: Two times a day (BID) | INTRAMUSCULAR | Status: DC
  Administered 2015-10-11 – 2015-10-12 (×3): 60 mg via INTRAVENOUS
  Filled 2015-10-10 (×3): qty 6

## 2015-10-10 MED ORDER — SODIUM CHLORIDE 0.9% FLUSH
3.0000 mL | INTRAVENOUS | Status: DC | PRN
  Administered 2015-10-12: 3 mL via INTRAVENOUS
  Filled 2015-10-10: qty 3

## 2015-10-10 MED ORDER — ALPRAZOLAM 0.25 MG PO TABS
0.2500 mg | ORAL_TABLET | Freq: Two times a day (BID) | ORAL | Status: DC
  Administered 2015-10-11 – 2015-10-15 (×9): 0.25 mg via ORAL
  Filled 2015-10-10 (×10): qty 1

## 2015-10-10 MED ORDER — HYDRALAZINE HCL 20 MG/ML IJ SOLN: 5.0000 mg | INTRAMUSCULAR | Status: DC | PRN

## 2015-10-10 MED ORDER — CARVEDILOL 6.25 MG PO TABS
6.2500 mg | ORAL_TABLET | Freq: Two times a day (BID) | ORAL | Status: DC
  Administered 2015-10-11: 6.25 mg via ORAL
  Filled 2015-10-10 (×2): qty 1

## 2015-10-10 MED ORDER — FUROSEMIDE 10 MG/ML IJ SOLN
60.0000 mg | Freq: Once | INTRAMUSCULAR | Status: AC
  Administered 2015-10-10: 60 mg via INTRAVENOUS
  Filled 2015-10-10: qty 8

## 2015-10-10 MED ORDER — ACETAMINOPHEN 325 MG PO TABS: 650.0000 mg | ORAL_TABLET | ORAL | Status: DC | PRN

## 2015-10-10 MED ORDER — ONDANSETRON HCL 4 MG/2ML IJ SOLN
4.0000 mg | Freq: Once | INTRAMUSCULAR | Status: AC
  Administered 2015-10-10: 4 mg via INTRAVENOUS
  Filled 2015-10-10: qty 2

## 2015-10-10 MED ORDER — SODIUM CHLORIDE 0.9% FLUSH
3.0000 mL | Freq: Two times a day (BID) | INTRAVENOUS | Status: DC
  Administered 2015-10-11 – 2015-10-14 (×8): 3 mL via INTRAVENOUS

## 2015-10-10 MED ORDER — SODIUM CHLORIDE 0.9 % IV SOLN: 250.0000 mL | INTRAVENOUS | Status: DC | PRN

## 2015-10-10 MED ORDER — DIGOXIN 125 MCG PO TABS
0.1250 mg | ORAL_TABLET | Freq: Every day | ORAL | Status: DC
  Administered 2015-10-11 – 2015-10-15 (×5): 0.125 mg via ORAL
  Filled 2015-10-10 (×5): qty 1

## 2015-10-10 MED ORDER — ATORVASTATIN CALCIUM 40 MG PO TABS
40.0000 mg | ORAL_TABLET | Freq: Every day | ORAL | Status: DC
  Administered 2015-10-11 – 2015-10-15 (×5): 40 mg via ORAL
  Filled 2015-10-10 (×5): qty 1

## 2015-10-10 MED ORDER — CLOPIDOGREL BISULFATE 75 MG PO TABS
75.0000 mg | ORAL_TABLET | Freq: Every day | ORAL | Status: DC
  Administered 2015-10-11 – 2015-10-15 (×5): 75 mg via ORAL
  Filled 2015-10-10 (×6): qty 1

## 2015-10-10 MED ORDER — LISINOPRIL 5 MG PO TABS: 5.0000 mg | ORAL_TABLET | Freq: Every day | ORAL | Status: DC

## 2015-10-10 MED ORDER — SACUBITRIL-VALSARTAN 24-26 MG PO TABS
1.0000 | ORAL_TABLET | Freq: Two times a day (BID) | ORAL | Status: DC
  Administered 2015-10-11 – 2015-10-15 (×9): 1 via ORAL
  Filled 2015-10-10 (×10): qty 1

## 2015-10-10 NOTE — ED Notes (Signed)
Patient transported to X-ray 

## 2015-10-10 NOTE — ED Notes (Addendum)
Pt refusing to stuck for lab that needs  To be recollected.  EDP and RN aware

## 2015-10-10 NOTE — Progress Notes (Signed)
No show  This encounter was created in error - please disregard.

## 2015-10-10 NOTE — ED Notes (Signed)
MD at bedside. 

## 2015-10-10 NOTE — ED Notes (Signed)
Pt reports ongoing weakness and shortness of breath for three months. Pt denies new symptoms since last seen yesterday but reports cough better post receiving prescription yesterday.

## 2015-10-10 NOTE — ED Notes (Addendum)
With triage pt asked pain level, pt begins to cough and states his chest hurts. Staff continues to question pt regarding chest pain and pt states "it's just the way it is; it hurts even when I don't cough for 3 or 4 days now."

## 2015-10-10 NOTE — H&P (Addendum)
History and Physical    Bruce [name] [id number] DOB: October 12, 1945 DOA: 10/10/2015  Referring MD/NP/PA:   PCP: [name], MD   Patient coming from:  The patient is coming from home.  At baseline, pt is independent for most of ADL.  Chief Complaint: Shortness of breath, nausea, vomiting, abdominal pain  HPI: Bruce [name] is a 70 y.o. male with medical history significant of sCHF with EF 15%, CML, hypertension, hyperlipidemia, anxiety, CAD, stroke, TIA, Bell's palsy, CKD-II, medication noncompliance, who presents with shortness of breath, nausea, vomiting, abdominal pain.  Patient reports that he has been having shortness of breath for several months, which has worsened recently. He has orthopnea and generalized weakness, no unilateral weakness. He has cough with white mucus production. He has intermittent mild chest pain, but no chest pain currently. No tenderness over calf areas. No fever or chills.  Patient also has nausea, vomiting, abdominal pain, which started this morning. He vomited 4 times without blood in the vomitus. He does not have diarrhea. Last bowel movement was in this morning. His abdominal pain is located in the periumbilical area, constant, 9 out of 10 in severity, nonradiating. It is not aggravated or alleviated by any known factors. Patient does not have symptoms of UTI, hematemesis, hematochezia, hematuria. Patient states that he did drink alcohol in the past 3 days. Patient states that he has not taken his leukemia medication, Dasatinib for 3 months because it makes him have poor sleep.  ED Course: pt was found to have BNP 4251, negative troponin, WBC 27 which was 15.1 on 10/03/15, slightly worsening renal function, temperature normal, no tachycardia, has tachypnea, chest x-ray is negative for infiltration, but showed cardiomegaly, pending urinalysis, digoxin level <0.2. CT abdomen/pelvis without contrast showed small ascites, diffuse mesenteric edema, and  anasarca, cholelithiasis. Patient is admitted to telemetry bed as inpatient for further eval and treatment.  Review of Systems:   General: no fevers, chills, no changes in body weight, has poor appetite, has fatigue HEENT: no blurry vision, hearing changes or sore throat Pulm: has dyspnea, coughing, no wheezing CV: has chest pain, no palpitations Abd: has nausea, vomiting, abdominal pain, no diarrhea, constipation GU: no dysuria, burning on urination, increased urinary frequency, hematuria  Ext: no leg edema Neuro: no unilateral weakness, numbness, or tingling, no vision change or hearing loss Skin: no rash MSK: No muscle spasm, no deformity, no limitation of range of movement in spin Heme: No easy bruising.  Travel history: No recent long distant travel.  Allergy: No Known Allergies  Past Medical History  Diagnosis Date  . Coronary artery disease   . Hypertension   . Hypercholesterolemia   . Stroke ([place])     No residual limb weakness.  Walks with cane at baseline.   Marland Kitchen TIA (transient ischemic attack) 05/10/2014  . Bell's palsy   . CML (chronic myelocytic leukemia) ([place])   . Leukemia ([place])   . CKD (chronic kidney disease), stage II     Past Surgical History  Procedure Laterality Date  . Back surgery    . Hip arthroplasty Right     orif  . Orif forearm fracture Right     Social History:  reports that he has quit smoking. His smoking use included Cigarettes. He has a 25 pack-year smoking history. He has never used smokeless tobacco. He reports that he drinks about 0.6 oz of alcohol per week. He reports that he does not use illicit drugs.  Family History:  Family History  Problem Relation Age of Onset  . Diabetes Mother   . Hypertension Mother   . Diabetes Father   . Hypertension Father   . Diabetes Brother   . Hypertension Brother   . Diabetes Sister   . Hypertension Sister   . Diabetes Brother   . Hypertension Brother   . Diabetes Sister   . Hypertension Sister        Prior to Admission medications   Medication Sig Start Date End Date Taking? Authorizing Provider  acetaminophen (TYLENOL) 325 MG tablet Take 2 tablets (650 mg total) by mouth every 4 (four) hours as needed for headache or mild pain. 06/06/15  Yes [name], MD  albuterol (PROVENTIL HFA;VENTOLIN HFA) 108 (90 BASE) MCG/ACT inhaler Inhale 2 puffs into the lungs every 4 (four) hours as needed for wheezing or shortness of breath. 08/08/14  Yes Noemi Chapel, MD  ALPRAZolam Duanne Moron) 0.25 MG tablet Take 0.25 mg by mouth 2 (two) times daily. 06/10/15  Yes Historical Provider, MD  aspirin 81 MG chewable tablet Chew 1 tablet (81 mg total) by mouth daily. 09/22/14  Yes Hosie Poisson, MD  atorvastatin (LIPITOR) 40 MG tablet Take 40 mg by mouth daily. 08/23/15  Yes Historical Provider, MD  benzonatate (TESSALON) 100 MG capsule Take 2 capsules (200 mg total) by mouth 2 (two) times daily as needed for cough. 10/09/15  Yes Nona Dell, PA-C  carvedilol (COREG) 6.25 MG tablet Take 6.25 mg by mouth 2 (two) times daily.   Yes Historical Provider, MD  clopidogrel (PLAVIX) 75 MG tablet Take 1 tablet (75 mg total) by mouth daily. 03/21/14  Yes [name], MD  dasatinib (SPRYCEL) 100 MG tablet Take 100 mg by mouth daily.   Yes Historical Provider, MD  digoxin (LANOXIN) 0.125 MG tablet Take 1 tablet (0.125 mg total) by mouth daily. 01/29/15  Yes Barton Dubois, MD  ENTRESTO 24-26 MG Take 1 tablet by mouth 2 (two) times daily.  08/05/15  Yes Historical Provider, MD  furosemide (LASIX) 80 MG tablet Take 80 mg by mouth daily as needed for fluid or edema.  03/27/15  Yes Historical Provider, MD  lisinopril (PRINIVIL,ZESTRIL) 5 MG tablet Take 5 mg by mouth daily. 08/23/15  Yes Historical Provider, MD  oxyCODONE-acetaminophen (PERCOCET/ROXICET) 5-325 MG tablet Take 2 tablets by mouth every 4 (four) hours as needed for severe pain. 06/24/15  Yes Chesley Noon Nadeau, PA-C  potassium chloride SA (K-DUR,KLOR-CON) 20  MEQ tablet Take 20 mEq by mouth daily. Reported on 10/03/2015 06/10/15  Yes Historical Provider, MD  carvedilol (COREG) 3.125 MG tablet Take 1 tablet (3.125 mg total) by mouth 2 (two) times daily with a meal. Patient not taking: Reported on 10/03/2015 06/06/15   [name], MD  guaiFENesin (ROBITUSSIN) 100 MG/5ML liquid Take 5-10 mLs (100-200 mg total) by mouth every 4 (four) hours as needed for cough. Patient not taking: Reported on 10/03/2015 09/28/15   Comer Locket, PA-C  isosorbide-hydrALAZINE (BIDIL) 20-37.5 MG tablet Take 0.5 tablets by mouth 2 (two) times daily. Patient not taking: Reported on 09/25/2015 01/29/15   Barton Dubois, MD    Physical Exam: Filed Vitals:   10/10/15 2330 10/11/15 0000 10/11/15 0038 10/11/15 0535  BP: 133/92 129/91 142/92 119/85  Pulse: 92 94 89 78  Temp:   97.5 F (36.4 C) 98.1 F (36.7 C)  TempSrc:   Oral Oral  Resp: 32 21 20 20   Height:   5\' 8"  (1.727 m)   Weight:   74.4 kg (164 lb 0.4  oz)   SpO2: 97% 98% 100% 100%   General: Not in acute distress HEENT:       Eyes: PERRL, EOMI, no scleral icterus.       ENT: No discharge from the ears and nose, no pharynx injection, no tonsillar enlargement.        Neck: positive JVD, no bruit, no mass felt. Heme: No neck lymph node enlargement. Cardiac: S1/S2, RRR, No murmurs, No gallops or rubs. Pulm:  No rales, wheezing, rhonchi or rubs. Abd: Distended, diffused tenderness, no rebound pain, no organomegaly, BS present. GU: No hematuria Ext: No pitting leg edema bilaterally. 2+DP/PT pulse bilaterally. Musculoskeletal: No joint deformities, No joint redness or warmth, no limitation of ROM in spin. Skin: No rashes.  Neuro: Alert, oriented X3, cranial nerves II-XII grossly intact, moves all extremities normally.Marland Kitchen Psych: Patient is not psychotic, no suicidal or hemocidal ideation.  Labs on Admission: I have personally reviewed following labs and imaging studies  CBC:  Recent Labs Lab 10/10/15 1820  WBC  27.0*  HGB 12.6*  HCT 38.1*  MCV 96.7  PLT [id number]*   Basic Metabolic Panel:  Recent Labs Lab 10/10/15 1923  NA 135  K 5.1  CL 104  CO2 21*  GLUCOSE 129*  BUN 30*  CREATININE 1.40*  CALCIUM 9.7  MG 2.2   GFR: Estimated Creatinine Clearance: 48.2 mL/min (by C-G formula based on Cr of 1.4). Liver Function Tests:  Recent Labs Lab 10/10/15 2059  AST 32  ALT 20  ALKPHOS 6*  BILITOT 2.8*  PROT 7.6  ALBUMIN 3.9    Recent Labs Lab 10/11/15 0122  LIPASE 22   No results for input(s): AMMONIA in the last 168 hours. Coagulation Profile:  Recent Labs Lab 10/11/15 0122  INR 1.78*   Cardiac Enzymes:  Recent Labs Lab 10/11/15 0122  TROPONINI 0.13*   BNP (last 3 results) No results for input(s): PROBNP in the last 8760 hours. HbA1C: No results for input(s): HGBA1C in the last 72 hours. CBG: No results for input(s): GLUCAP in the last 168 hours. Lipid Profile: No results for input(s): CHOL, HDL, LDLCALC, TRIG, CHOLHDL, LDLDIRECT in the last 72 hours. Thyroid Function Tests: No results for input(s): TSH, T4TOTAL, FREET4, T3FREE, THYROIDAB in the last 72 hours. Anemia Panel: No results for input(s): VITAMINB12, FOLATE, FERRITIN, TIBC, IRON, RETICCTPCT in the last 72 hours. Urine analysis:    Component Value Date/Time   COLORURINE AMBER* 09/25/2015 1731   APPEARANCEUR CLEAR 09/25/2015 1731   LABSPEC 1.022 09/25/2015 1731   PHURINE 6.0 09/25/2015 1731   GLUCOSEU NEGATIVE 09/25/2015 1731   HGBUR NEGATIVE 09/25/2015 1731   BILIRUBINUR SMALL* 09/25/2015 1731   KETONESUR NEGATIVE 09/25/2015 1731   PROTEINUR 30* 09/25/2015 1731   UROBILINOGEN 2.0* 10/17/2014 1334   NITRITE NEGATIVE 09/25/2015 1731   LEUKOCYTESUR NEGATIVE 09/25/2015 1731   Sepsis Labs: @LABRCNTIP (procalcitonin:4,lacticidven:4) )No results found for this or any previous visit (from the past 240 hour(s)).   Radiological Exams on Admission: Ct Abdomen Pelvis Wo Contrast  10/11/2015  CLINICAL  DATA:  70 year old male with abdominal pain EXAM: CT ABDOMEN AND PELVIS WITHOUT CONTRAST TECHNIQUE: Multidetector CT imaging of the abdomen and pelvis was performed following the standard protocol without IV contrast. COMPARISON:  Abdominal CT dated 08/20/2015 FINDINGS: Evaluation of this exam is limited in the absence of intravenous contrast. The visualized lung bases are clear. Cardiomegaly. There is hypoattenuation of the cardiac blood pool suggestive of a degree of anemia. Clinical correlation is recommended. No intra-abdominal free air.  Small ascites and diffuse mesenteric stranding. Layering sludge versus small stones within the gallbladder. The liver, pancreas, spleen, adrenal glands, kidneys, visualized ureters, and urinary bladder appear unremarkable. The prostate and seminal vesicles are grossly unremarkable. There is moderate stool throughout the colon. No evidence of bowel obstruction. Normal appendix. There is aortoiliac atherosclerotic disease. There is a 2.3 cm ectasia of the infrarenal abdominal aorta. Evaluation of the vasculature is limited in the absence of intravenous contrast. No portal venous gas identified. There is no adenopathy. There is diffuse subcutaneous soft tissue edema and anasarca there is degenerative changes of the spine. Right femoral intra medullary hardware. No acute fracture. IMPRESSION: Small ascites, diffuse mesenteric edema, and anasarca. Findings may be related to underlying liver disease. Clinical correlation is recommended. Cholelithiasis. No evidence of bowel obstruction.  Normal appendix. Electronically Signed   By: Anner Crete M.D.   On: 10/11/2015 02:02   Dg Chest 2 View  10/10/2015  CLINICAL DATA:  Chest pain cough EXAM: CHEST  2 VIEW COMPARISON:  10/03/2015 FINDINGS: Cardiac enlargement unchanged. Negative for heart failure. No edema or effusion. Negative for pneumonia. No mass or adenopathy. IMPRESSION: Cardiac enlargement.  No acute cardiopulmonary  abnormality. Electronically Signed   By: Franchot Gallo M.D.   On: 10/10/2015 18:06     EKG: Independently reviewed. Sinus rhythm, QTC 518, LAD, TWI in V5-V6, poor R-wave progression.  Assessment/Plan Principal Problem:   Acute on chronic systolic (congestive) heart failure (HCC) Active Problems:   CML (chronic myelocytic leukemia) (HCC)   TIA (transient ischemic attack)   H/O: stroke with residual effects   Abdominal pain   Elevated troponin   Essential hypertension   Coronary artery disease   CKD (chronic kidney disease), stage II   Nausea & vomiting   CHF exacerbation (HCC)   Elevated lactic acid level   Acute on chronic systolic (congestive) heart failure ([place]): Patient's shortness of breath, orthopnea, positive JVD plus elevated BNP, are consistent with CHF exacerbation. Today her contents/15/16 showed EF 15%.   -will place on tele bed for obs -Pt is dr. Zenia Resides pt, please call him in morning. -Lasix 60 mg bid by IV -trop x 3 -2d echo -Risk factor stratification: A1c, FLP -will continue home ASA - decreased his coreg from 6.25 to 3.125 mg bid - on digoxin and Bidil - continue Entresto - Daily weights - strict I/O's - Low salt diet when able to eat  Elevated trop and CAD: Patient had a mild intermittent chest pain, but no chest pain currently. Troponin 0.13, a seems to be chronic issue. His previously troponin was 0.06 on 08/20/15, he had creatinine up to 1.26 on 05/2015. This is likely due to demanding ischemia secondary to CHF exacerbation. -continue ASA, lipitor, coreg and plavix -trend trop -f/u 2d echo.  Nausea, vomiting and abdominal pain: Etiology is not clear. Lipase 22. CT abdomen/pelvis showed small ascites, diffuse mesenteric edema, and anasarca, which is likely due to CHF and may explain his AP.  -prn zofran and morphine for pain -treat CHF as above  Elevated lactic acid level: 2.5 on admission.  No sign of infection. Likely due to poor gut perfusion  2/2 of CHF exacerbation -treat CHF as above -trend lactic acid  CML (chronic myelocytic leukemia) ([place]): has been f/u with Dr. Burr Medico. Not complaint to his med. States that stopped taking Dasatinib for 3 months. WBC 15/1 on 10/03/15-->27. -f/u with Dr. Burr Medico  TIA and stroke: no new issues -continue aspirin, Plavix and Lipitor  Essential  hypertension: -On Coreg, Entresto -on IV lasix -IV hydralazine when necessary  CKD (chronic kidney disease), stage II: Baseline creatinine 1.2-1.33 his creatinine is 1.4, BUN 30, which is slightly worse than baseline, may be due to cardiorenal failure secondary to worsening CHF. -Follow-up by BMP  DVT ppx: SCD Code Status: Full code Family Communication: None at bed side.  Disposition Plan:  Anticipate discharge back to previous home environment Consults called:  none Admission status: tele/inpt  Date of Service 10/11/2015    Ivor Costa [facility] Pager [phone]  If 7PM-7AM, please contact night-coverage [email or web address] Password [facility] 10/11/2015, 5:54 AM

## 2015-10-10 NOTE — ED Provider Notes (Signed)
CSN: [id number]     Arrival date & time 10/10/15  1706 History   First MD Initiated Contact with Patient 10/10/15 1754     Chief Complaint  Patient presents with  . Weakness  . Shortness of Breath     (Consider location/radiation/quality/duration/timing/severity/associated sxs/prior Treatment) HPI   Unclear specific chief concern for being in ED--reports dizziness for years, difficulty ambulating, nausea. Had difficulty sleeping last night in shelter. Has hx of leukemia and CHF. Taking lasix and feels leg swelling has gone down.  Ate beef stroganoff began to have burning in chest, nausea and vomiting  Dizziness for years, shortness of breath for months. Shortness of breath seemed worse last night when trying to sleep.  Notes worsening orthopnea.  Very fatigued. Difficulty walking around with fatigue and dyspnea as well as dizziness. Denies focal neurologic symptoms.  Past Medical History  Diagnosis Date  . Coronary artery disease   . Hypertension   . Hypercholesterolemia   . Stroke ([place])     No residual limb weakness.  Walks with cane at baseline.   Marland Kitchen TIA (transient ischemic attack) 05/10/2014  . Bell's palsy   . CML (chronic myelocytic leukemia) ([place])   . Leukemia ([place])   . CKD (chronic kidney disease), stage II    Past Surgical History  Procedure Laterality Date  . Back surgery    . Hip arthroplasty Right     orif  . Orif forearm fracture Right    Family History  Problem Relation Age of Onset  . Diabetes Mother   . Hypertension Mother   . Diabetes Father   . Hypertension Father   . Diabetes Brother   . Hypertension Brother   . Diabetes Sister   . Hypertension Sister   . Diabetes Brother   . Hypertension Brother   . Diabetes Sister   . Hypertension Sister    Social History  Substance Use Topics  . Smoking status: Former Smoker -- 0.50 packs/day for 50 years    Types: Cigarettes  . Smokeless tobacco: Never Used  . Alcohol Use: 0.6 oz/week    1 Cans of beer  per week     Comment: daily     Review of Systems  Constitutional: Positive for appetite change and fatigue. Negative for fever.  HENT: Negative for sore throat.   Eyes: Negative for visual disturbance.  Respiratory: Positive for cough and shortness of breath.   Cardiovascular: Positive for leg swelling. Negative for chest pain.  Gastrointestinal: Positive for nausea, vomiting, abdominal pain and constipation. Negative for diarrhea.  Genitourinary: Negative for difficulty urinating.  Musculoskeletal: Negative for back pain and neck stiffness.  Skin: Negative for rash.  Neurological: Positive for dizziness and light-headedness. Negative for syncope and headaches.      Allergies  Review of patient's allergies indicates no known allergies.  Home Medications   Prior to Admission medications   Medication Sig Start Date End Date Taking? Authorizing Provider  acetaminophen (TYLENOL) 325 MG tablet Take 2 tablets (650 mg total) by mouth every 4 (four) hours as needed for headache or mild pain. 06/06/15  Yes [name], MD  albuterol (PROVENTIL HFA;VENTOLIN HFA) 108 (90 BASE) MCG/ACT inhaler Inhale 2 puffs into the lungs every 4 (four) hours as needed for wheezing or shortness of breath. 08/08/14  Yes Noemi Chapel, MD  ALPRAZolam Duanne Moron) 0.25 MG tablet Take 0.25 mg by mouth 2 (two) times daily. 06/10/15  Yes Historical Provider, MD  aspirin 81 MG chewable tablet Chew 1 tablet (81  mg total) by mouth daily. 09/22/14  Yes Hosie Poisson, MD  atorvastatin (LIPITOR) 40 MG tablet Take 40 mg by mouth daily. 08/23/15  Yes Historical Provider, MD  benzonatate (TESSALON) 100 MG capsule Take 2 capsules (200 mg total) by mouth 2 (two) times daily as needed for cough. 10/09/15  Yes Nona Dell, PA-C  carvedilol (COREG) 6.25 MG tablet Take 6.25 mg by mouth 2 (two) times daily.   Yes Historical Provider, MD  clopidogrel (PLAVIX) 75 MG tablet Take 1 tablet (75 mg total) by mouth daily. 03/21/14  Yes  [name], MD  dasatinib (SPRYCEL) 100 MG tablet Take 100 mg by mouth daily.   Yes Historical Provider, MD  digoxin (LANOXIN) 0.125 MG tablet Take 1 tablet (0.125 mg total) by mouth daily. 01/29/15  Yes Barton Dubois, MD  ENTRESTO 24-26 MG Take 1 tablet by mouth 2 (two) times daily.  08/05/15  Yes Historical Provider, MD  furosemide (LASIX) 80 MG tablet Take 80 mg by mouth daily as needed for fluid or edema.  03/27/15  Yes Historical Provider, MD  lisinopril (PRINIVIL,ZESTRIL) 5 MG tablet Take 5 mg by mouth daily. 08/23/15  Yes Historical Provider, MD  oxyCODONE-acetaminophen (PERCOCET/ROXICET) 5-325 MG tablet Take 2 tablets by mouth every 4 (four) hours as needed for severe pain. 06/24/15  Yes Chesley Noon Nadeau, PA-C  potassium chloride SA (K-DUR,KLOR-CON) 20 MEQ tablet Take 20 mEq by mouth daily. Reported on 10/03/2015 06/10/15  Yes Historical Provider, MD  carvedilol (COREG) 3.125 MG tablet Take 1 tablet (3.125 mg total) by mouth 2 (two) times daily with a meal. Patient not taking: Reported on 10/03/2015 06/06/15   [name], MD  guaiFENesin (ROBITUSSIN) 100 MG/5ML liquid Take 5-10 mLs (100-200 mg total) by mouth every 4 (four) hours as needed for cough. Patient not taking: Reported on 10/03/2015 09/28/15   Comer Locket, PA-C  isosorbide-hydrALAZINE (BIDIL) 20-37.5 MG tablet Take 0.5 tablets by mouth 2 (two) times daily. Patient not taking: Reported on 09/25/2015 01/29/15   Barton Dubois, MD   BP 119/85 mmHg  Pulse 68  Temp(Src) 98.1 F (36.7 C) (Oral)  Resp 20  Ht 5\' 8"  (1.727 m)  Wt 164 lb 0.4 oz (74.4 kg)  BMI 24.95 kg/m2  SpO2 100% Physical Exam  Constitutional: He is oriented to person, place, and time. He appears well-developed and well-nourished. No distress.  HENT:  Head: Normocephalic and atraumatic.  Eyes: Conjunctivae and EOM are normal.  Neck: Normal range of motion. JVD present.  Cardiovascular: Normal rate, regular rhythm, normal heart sounds and intact distal  pulses.  Exam reveals no gallop and no friction rub.   No murmur heard. Pulmonary/Chest: Effort normal and breath sounds normal. No respiratory distress. He has no wheezes. He has no rales.  Abdominal: Soft. He exhibits no distension. There is tenderness (reports diffuse mild). There is no guarding.  Musculoskeletal: He exhibits edema (2+ bilaterally).  Neurological: He is alert and oriented to person, place, and time.  Skin: Skin is warm and dry. He is not diaphoretic.  Nursing note and vitals reviewed.   ED Course  Procedures (including critical care time) Labs Review Labs Reviewed  CBC - Abnormal; Notable for the following:    WBC 27.0 (*)    RBC 3.94 (*)    Hemoglobin 12.6 (*)    HCT 38.1 (*)    RDW 16.7 (*)    Platelets 524 (*)    All other components within normal limits  BASIC METABOLIC PANEL - Abnormal; Notable  for the following:    CO2 21 (*)    Glucose, Bld 129 (*)    BUN 30 (*)    Creatinine, Ser 1.40 (*)    GFR calc non Af Amer 50 (*)    GFR calc Af Amer 58 (*)    All other components within normal limits  BRAIN NATRIURETIC PEPTIDE - Abnormal; Notable for the following:    B Natriuretic Peptide 4251.7 (*)    All other components within normal limits  DIGOXIN LEVEL - Abnormal; Notable for the following:    Digoxin Level <0.2 (*)    All other components within normal limits  HEPATIC FUNCTION PANEL - Abnormal; Notable for the following:    Alkaline Phosphatase 6 (*)    Total Bilirubin 2.8 (*)    Bilirubin, Direct 1.3 (*)    Indirect Bilirubin 1.5 (*)    All other components within normal limits  PROTIME-INR - Abnormal; Notable for the following:    Prothrombin Time 20.0 (*)    INR 1.78 (*)    All other components within normal limits  BASIC METABOLIC PANEL - Abnormal; Notable for the following:    Glucose, Bld 126 (*)    BUN 33 (*)    Creatinine, Ser 1.44 (*)    GFR calc non Af Amer 48 (*)    GFR calc Af Amer 56 (*)    All other components within normal  limits  TROPONIN I - Abnormal; Notable for the following:    Troponin I 0.13 (*)    All other components within normal limits  TROPONIN I - Abnormal; Notable for the following:    Troponin I 0.12 (*)    All other components within normal limits  TROPONIN I - Abnormal; Notable for the following:    Troponin I 0.11 (*)    All other components within normal limits  LACTIC ACID, PLASMA - Abnormal; Notable for the following:    Lactic Acid, Venous 2.5 (*)    All other components within normal limits  CULTURE, BLOOD (ROUTINE X 2)  CULTURE, BLOOD (ROUTINE X 2)  MAGNESIUM  LIPASE, BLOOD  APTT  URINALYSIS, ROUTINE W REFLEX MICROSCOPIC (NOT AT [facility])  LACTIC ACID, PLASMA  PROCALCITONIN  BASIC METABOLIC PANEL  I-STAT TROPOININ, ED  TYPE AND SCREEN  ABO/RH    Imaging Review Ct Abdomen Pelvis Wo Contrast  10/11/2015  CLINICAL DATA:  69 year old male with abdominal pain EXAM: CT ABDOMEN AND PELVIS WITHOUT CONTRAST TECHNIQUE: Multidetector CT imaging of the abdomen and pelvis was performed following the standard protocol without IV contrast. COMPARISON:  Abdominal CT dated 08/20/2015 FINDINGS: Evaluation of this exam is limited in the absence of intravenous contrast. The visualized lung bases are clear. Cardiomegaly. There is hypoattenuation of the cardiac blood pool suggestive of a degree of anemia. Clinical correlation is recommended. No intra-abdominal free air. Small ascites and diffuse mesenteric stranding. Layering sludge versus small stones within the gallbladder. The liver, pancreas, spleen, adrenal glands, kidneys, visualized ureters, and urinary bladder appear unremarkable. The prostate and seminal vesicles are grossly unremarkable. There is moderate stool throughout the colon. No evidence of bowel obstruction. Normal appendix. There is aortoiliac atherosclerotic disease. There is a 2.3 cm ectasia of the infrarenal abdominal aorta. Evaluation of the vasculature is limited in the absence of  intravenous contrast. No portal venous gas identified. There is no adenopathy. There is diffuse subcutaneous soft tissue edema and anasarca there is degenerative changes of the spine. Right femoral intra medullary hardware. No acute  fracture. IMPRESSION: Small ascites, diffuse mesenteric edema, and anasarca. Findings may be related to underlying liver disease. Clinical correlation is recommended. Cholelithiasis. No evidence of bowel obstruction.  Normal appendix. Electronically Signed   By: Anner Crete M.D.   On: 10/11/2015 02:02   Dg Chest 2 View  10/10/2015  CLINICAL DATA:  Chest pain cough EXAM: CHEST  2 VIEW COMPARISON:  10/03/2015 FINDINGS: Cardiac enlargement unchanged. Negative for heart failure. No edema or effusion. Negative for pneumonia. No mass or adenopathy. IMPRESSION: Cardiac enlargement.  No acute cardiopulmonary abnormality. Electronically Signed   By: Franchot Gallo M.D.   On: 10/10/2015 18:06   I have personally reviewed and evaluated these images and lab results as part of my medical decision-making.   EKG Interpretation   Date/Time:  Friday October 10 2015 17:20:45 EDT Ventricular Rate:  90 PR Interval:    QRS Duration: 102 QT Interval:  423 QTC Calculation: 518 R Axis:   -42 Text Interpretation:  Sinus rhythm Left axis deviation Abnormal R-wave  progression, late transition Nonspecific T abnormalities, lateral leads  Prolonged QT interval No significant change since last tracing Confirmed  by [facility] MD, Reed Eifert ([id number]) on 10/10/2015 6:54:31 PM      MDM   Final diagnoses:  CKD (chronic kidney disease), stage II  Abdominal pain  Acute on chronic systolic congestive heart failure ([place])    70 year old male with a history of CML, nonischemic cardiomyopathy with an ejection fraction of 10-15%, hypertension, hypercholesterolemia, history of CVA, multiple recent emergency department visits for concerns such as chronic shortness of breath and chronic cough, presents  with concern of continuing generalized weakness, orthopnea, increasing dyspnea, nausea and vomiting.  Patient is a poor historian regarding symptoms most concerning to him, as well as duration and progression of symptoms. Do not feel exam/hx is consistent with SBO or other acute intraabdominal pathology.  Chest x-ray was obtained which shows no evidence of pulmonary edema or pneumonia, however BNP is elevated to 4000, patient has JVD on exam, bilateral lower extremity edema, and describes significant orthopnea, and suspect exacerbation of congestive heart failure is likely etiology of his fatigue, vomiting and dyspnea.  Patient also has a leukocytosis to 27,000, which may be secondary to recent steroids given in ED.   Patient given 60mg  IV lasix and hospitalist consulted for admission.   Gareth Morgan, MD 10/11/15 1301

## 2015-10-10 NOTE — Telephone Encounter (Signed)
Pt was NO SHOW for office visit appt today 10/10/15.   Attempted to call pt at home unsuccessfully.  Phone just ringing and hung up.  Unable to leave message due to no voice mail.   Attempted to call emergency contact - sister's phone unavailable due to phone not in service. Spoke with Aaron Edelman, pharmacist @ [facility].  Was informed that pt last filled  [facility]  On  07/24/2014.   NO  REFILL  OF  SPRYCEL   Until  Pt  Sees Dr. Burr Medico again for evaluation. Aaron Edelman voiced understanding.

## 2015-10-10 NOTE — ED Notes (Signed)
Admitting physician at bedside

## 2015-10-11 ENCOUNTER — Inpatient Hospital Stay (HOSPITAL_COMMUNITY): Payer: Medicare Other

## 2015-10-11 DIAGNOSIS — R7989 Other specified abnormal findings of blood chemistry: Secondary | ICD-10-CM | POA: Diagnosis present

## 2015-10-11 LAB — BASIC METABOLIC PANEL
ANION GAP: 9 (ref 5–15)
BUN: 33 mg/dL — AB (ref 6–20)
CHLORIDE: 104 mmol/L (ref 101–111)
CO2: 23 mmol/L (ref 22–32)
Calcium: 9.5 mg/dL (ref 8.9–10.3)
Creatinine, Ser: 1.44 mg/dL — ABNORMAL HIGH (ref 0.61–1.24)
GFR, EST AFRICAN AMERICAN: 56 mL/min — AB (ref >60)
GFR, EST NON AFRICAN AMERICAN: 48 mL/min — AB (ref >60)
Glucose, Bld: 126 mg/dL — ABNORMAL HIGH (ref 65–99)
POTASSIUM: 4.4 mmol/L (ref 3.5–5.1)
SODIUM: 136 mmol/L (ref 135–145)

## 2015-10-11 LAB — LIPASE, BLOOD: Lipase: 22 U/L (ref 11–51)

## 2015-10-11 LAB — URINALYSIS, ROUTINE W REFLEX MICROSCOPIC
Bilirubin Urine: NEGATIVE
GLUCOSE, UA: NEGATIVE mg/dL
Hgb urine dipstick: NEGATIVE
Ketones, ur: NEGATIVE mg/dL
LEUKOCYTES UA: NEGATIVE
NITRITE: NEGATIVE
PH: 7 (ref 5.0–8.0)
Protein, ur: NEGATIVE mg/dL
SPECIFIC GRAVITY, URINE: 1.008 (ref 1.005–1.030)

## 2015-10-11 LAB — TROPONIN I
TROPONIN I: 0.13 ng/mL — AB (ref <0.03)
TROPONIN I: 0.12 ng/mL — AB (ref <0.03)
TROPONIN I: 0.11 ng/mL — AB (ref <0.03)

## 2015-10-11 LAB — LACTIC ACID, PLASMA
Lactic Acid, Venous: 2.5 mmol/L (ref 0.5–1.9)
Lactic Acid, Venous: 1.4 mmol/L (ref 0.5–1.9)

## 2015-10-11 LAB — APTT: APTT: 32 s (ref 24–37)

## 2015-10-11 LAB — ABO/RH: ABO/RH(D): O POS

## 2015-10-11 LAB — TYPE AND SCREEN
ABO/RH(D): O POS
Antibody Screen: NEGATIVE

## 2015-10-11 LAB — PROTIME-INR
INR: 1.78 — AB (ref 0.00–1.49)
Prothrombin Time: 20 seconds — ABNORMAL HIGH (ref 11.6–15.2)

## 2015-10-11 LAB — PROCALCITONIN: PROCALCITONIN: 0.1 ng/mL

## 2015-10-11 MED ORDER — CARVEDILOL 3.125 MG PO TABS
3.1250 mg | ORAL_TABLET | Freq: Two times a day (BID) | ORAL | Status: DC
  Administered 2015-10-11 – 2015-10-15 (×8): 3.125 mg via ORAL
  Filled 2015-10-11 (×8): qty 1

## 2015-10-11 MED ORDER — ONDANSETRON HCL 4 MG/2ML IJ SOLN
4.0000 mg | Freq: Four times a day (QID) | INTRAMUSCULAR | Status: DC | PRN
  Administered 2015-10-11: 4 mg via INTRAVENOUS
  Filled 2015-10-11: qty 2

## 2015-10-11 MED ORDER — DIATRIZOATE MEGLUMINE & SODIUM 66-10 % PO SOLN
15.0000 mL | Freq: Once | ORAL | Status: AC
Start: 2015-10-11 — End: 2015-10-11
  Administered 2015-10-11: 15 mL via ORAL
  Filled 2015-10-11: qty 30

## 2015-10-11 NOTE — Progress Notes (Signed)
Echocardiogram 2D Echocardiogram has been performed.  Tresa Res 10/11/2015, 12:12 PM

## 2015-10-11 NOTE — Progress Notes (Signed)
Ref: [name], MD   Subjective:  Good diuresis. Feeling better. Echocardiogram again shows poor LV function, EF 20-25 % with moderate MR and severe TR.  Objective:  Vital Signs in the last 24 hours: Temp:  [97.5 F (36.4 C)-98.1 F (36.7 C)] 98.1 F (36.7 C) (07/01 0535) Pulse Rate:  [68-95] 68 (07/01 1017) Cardiac Rhythm:  [-] Normal sinus rhythm (07/01 0706) Resp:  [13-35] 20 (07/01 0535) BP: (114-157)/(85-104) 119/85 mmHg (07/01 0535) SpO2:  [89 %-100 %] 100 % (07/01 0535) Weight:  [74.4 kg (164 lb 0.4 oz)] 74.4 kg (164 lb 0.4 oz) (07/01 0038)  Physical Exam: BP Readings from Last 1 Encounters:  10/11/15 119/85    Wt Readings from Last 1 Encounters:  10/11/15 74.4 kg (164 lb 0.4 oz)    Weight change:   HEENT: [place]/AT, Eyes-Brown, PERL, EOMI, Conjunctiva-Pink, Sclera-Non-icteric Neck: + full JVD, No bruit, Trachea midline. Lungs:  Clear, Bilateral. Cardiac:  Regular rhythm, normal S1 and S2, no S3. II/VI systolic murmur. Abdomen:  Soft, non-tender. Extremities:  No edema present. No cyanosis. No clubbing. CNS: AxOx3, Cranial nerves grossly intact, moves all 4 extremities. Right handed. Skin: Warm and dry.   Intake/Output from previous day: 06/30 0701 - 07/01 0700 In: -  Out: 1400 [Urine:1400]    Lab Results: BMET    Component Value Date/Time   NA 136 10/11/2015 0521   NA 135 10/10/2015 1923   NA 137 10/03/2015 0900   NA 142 07/31/2014 1244   NA 142 06/26/2014 1008   NA 140 05/13/2014 0937   K 4.4 10/11/2015 0521   K 5.1 10/10/2015 1923   K 4.1 10/03/2015 0900   K 4.3 07/31/2014 1244   K 4.1 06/26/2014 1008   K 3.5 05/13/2014 0937   CL 104 10/11/2015 0521   CL 104 10/10/2015 1923   CL 107 10/03/2015 0900   CO2 23 10/11/2015 0521   CO2 21* 10/10/2015 1923   CO2 22 10/03/2015 0900   CO2 22 07/31/2014 1244   CO2 25 06/26/2014 1008   CO2 22 05/13/2014 0937   GLUCOSE 126* 10/11/2015 0521   GLUCOSE 129* 10/10/2015 1923   GLUCOSE 105* 10/03/2015 0900    GLUCOSE 133 07/31/2014 1244   GLUCOSE 174* 06/26/2014 1008   GLUCOSE 210* 05/13/2014 0937   BUN 33* 10/11/2015 0521   BUN 30* 10/10/2015 1923   BUN 18 10/03/2015 0900   BUN 18.3 07/31/2014 1244   BUN 8.3 06/26/2014 1008   BUN 9.1 05/13/2014 0937   CREATININE 1.44* 10/11/2015 0521   CREATININE 1.40* 10/10/2015 1923   CREATININE 1.23 10/03/2015 0900   CREATININE 1.2 07/31/2014 1244   CREATININE 1.1 06/26/2014 1008   CREATININE 1.0 05/13/2014 0937   CALCIUM 9.5 10/11/2015 0521   CALCIUM 9.7 10/10/2015 1923   CALCIUM 9.3 10/03/2015 0900   CALCIUM 9.2 07/31/2014 1244   CALCIUM 9.0 06/26/2014 1008   CALCIUM 8.9 05/13/2014 0937   GFRNONAA 48* 10/11/2015 0521   GFRNONAA 50* 10/10/2015 1923   GFRNONAA 58* 10/03/2015 0900   GFRAA 56* 10/11/2015 0521   GFRAA 58* 10/10/2015 1923   GFRAA >60 10/03/2015 0900   CBC    Component Value Date/Time   WBC 27.0* 10/10/2015 1820   WBC 21.3* 07/31/2014 1244   RBC 3.94* 10/10/2015 1820   RBC 2.56* 09/19/2014 0730   RBC 3.58* 07/31/2014 1244   HGB 12.6* 10/10/2015 1820   HGB 10.3* 07/31/2014 1244   HCT 38.1* 10/10/2015 1820   HCT 32.9* 07/31/2014  1244   PLT 524* 10/10/2015 1820   PLT 831* 07/31/2014 1244   MCV 96.7 10/10/2015 1820   MCV 91.9 07/31/2014 1244   MCH 32.0 10/10/2015 1820   MCH 28.9 07/31/2014 1244   MCHC 33.1 10/10/2015 1820   MCHC 31.4* 07/31/2014 1244   RDW 16.7* 10/10/2015 1820   RDW 17.3* 07/31/2014 1244   LYMPHSABS 2.0 10/03/2015 0900   LYMPHSABS 3.6* 07/31/2014 1244   MONOABS 1.4* 10/03/2015 0900   MONOABS 1.2* 07/31/2014 1244   EOSABS 0.3 10/03/2015 0900   EOSABS 0.4 07/31/2014 1244   BASOSABS 0.3* 10/03/2015 0900   BASOSABS 0.0 07/31/2014 1244   HEPATIC Function Panel  Recent Labs  08/20/15 1913 08/25/15 1431 10/10/15 2059  PROT 8.2* 7.0 7.6   HEMOGLOBIN A1C No components found for: HGA1C,  MPG CARDIAC ENZYMES Lab Results  Component Value Date   CKTOTAL 165 09/13/2014   CKMB 2.4 12/17/2010    TROPONINI 0.11* 10/11/2015   TROPONINI 0.12* 10/11/2015   TROPONINI 0.13* 10/11/2015   BNP No results for input(s): PROBNP in the last 8760 hours. TSH  Recent Labs  01/25/15 0341  TSH 0.549   CHOLESTEROL No results for input(s): CHOL in the last 8760 hours.  Scheduled Meds: . ALPRAZolam  0.25 mg Oral BID  . aspirin  81 mg Oral Daily  . atorvastatin  40 mg Oral Daily  . carvedilol  3.125 mg Oral BID WC  . clopidogrel  75 mg Oral Q breakfast  . digoxin  0.125 mg Oral Daily  . furosemide  60 mg Intravenous BID  . sacubitril-valsartan  1 tablet Oral BID  . sodium chloride flush  3 mL Intravenous Q12H   Continuous Infusions:  PRN Meds:.sodium chloride, acetaminophen, hydrALAZINE, morphine injection, ondansetron (ZOFRAN) IV, oxyCODONE-acetaminophen, sodium chloride flush  Assessment/Plan: Acute on chronic systolic left heart failure Hypertension Hypercholesterolemia CAD S/P Stroke Chronic myelocytic leukemia CKD, II  Abnormal Troponin-I from demand ischemia  Continue diuresis.   LOS: 1 day    Dixie Dials  MD  10/11/2015, 3:07 PM

## 2015-10-11 NOTE — Progress Notes (Signed)
BP 90/51. Patient c/o dizziness. Pt stated "I feel weird." Pt also c/o of worsening slurred speech. Pt stated he's had slurred speech for the past couple of days, but he felt it was getting worse. Neuro checks WNL. Dr. Doylene Canard made aware. No new orders placed. Will continue to monitor closely.

## 2015-10-11 NOTE — Congregational Nurse Program (Signed)
Congregational Nurse Program Note  Date of Encounter: 09/30/2015  Past Medical History: Past Medical History  Diagnosis Date  . Coronary artery disease   . Hypertension   . Hypercholesterolemia   . Stroke ([place])     No residual limb weakness.  Walks with cane at baseline.   Marland Kitchen TIA (transient ischemic attack) 05/10/2014  . Bell's palsy   . CML (chronic myelocytic leukemia) ([place])   . Leukemia ([place])   . CKD (chronic kidney disease), stage II     Encounter Details:     CNP Questionnaire - 09/30/15 1242    Patient Demographics   Is this a new or existing patient? New   Patient is considered a/an Not Applicable   Race American Indian/[place] Native   Patient Assistance   Location of Patient Assistance Not Applicable   Patient's financial/insurance status Low Income;Medicare;Medicaid   Uninsured Patient No   Patient referred to apply for the following financial assistance Not Applicable   Food insecurities addressed Provided food supplies   Transportation assistance No   Assistance securing medications No   Educational health offerings Acute disease;Navigating the healthcare system   Encounter Details   Primary purpose of visit Acute Illness/Condition Visit;Navigating the Healthcare System   Was an Emergency Department visit averted? Not Applicable   Does patient have a medical provider? Yes   Patient referred to Clinic   Was a mental health screening completed? (GAINS tool) No   Does patient have dental issues? Yes   Was a dental referral made? No resources for a referral   Does patient have vision issues? No   Does your patient have an abnormal blood pressure today? No   Since previous encounter, have you referred patient for abnormal blood pressure that resulted in a new diagnosis or medication change? No   Does your patient have an abnormal blood glucose today? No   Since previous encounter, have you referred patient for abnormal blood glucose that resulted in a new diagnosis  or medication change? No   Was there a life-saving intervention made? No       Client requesting assistance with obtaining a dentist.  Client has Medicaid.  Attempted to contact several area dentist, could not find one that accepts medicaid.  TC to the Adult Dental clinic.  Left message.  Clinic did not return call.  Encouraged client to return next clinic day to continue locating a resource for dental

## 2015-10-11 NOTE — Progress Notes (Signed)
CRITICAL VALUE ALERT  Critical value received: Troponin 0.13;Lactic Acid  Date of notification:  10/11/15  Time of notification:  0125  Critical value read back:yes  Nurse who received alert:  Dellie Catholic  MD notified (1st page):  yes  Time of first page:  0125

## 2015-10-11 NOTE — Evaluation (Signed)
Physical Therapy Evaluation Patient Details Name: Bruce [name] MRN: [id number] DOB: January 22, 1946 Today's Date: 10/11/2015   History of Present Illness  Pt admitted through ED with c/o weakness/SOB and hx of CAD, CVA, TIA, CKD, R hip ORIF, back surgery and leukemia  Clinical Impression  Pt admitted as above and presenting with functional mobility limitations 2* generalized weakness, poor endurance and balance deficits.  Pt c/o dizziness while ambulating - BP 110/60 with HR 75 and SaO2 99% - RN aware.    Follow Up Recommendations No PT follow up;Home health PT (dependent on acute stay progress and disposition)    Equipment Recommendations  Rolling walker with 5" wheels    Recommendations for Other Services       Precautions / Restrictions Precautions Precautions: Fall Precaution Comments: Several episodes balance loss and near falls while attempting ambulation with cane Restrictions Weight Bearing Restrictions: No      Mobility  Bed Mobility Overal bed mobility: Modified Independent             General bed mobility comments: Supine to sit unassisted  Transfers Overall transfer level: Needs assistance Equipment used: None Transfers: Sit to/from Stand Sit to Stand: Min guard         General transfer comment: to stabilize with initial standing  Ambulation/Gait Ambulation/Gait assistance: Min assist;Min guard Ambulation Distance (Feet): 250 Feet Assistive device: Rolling walker (2 wheeled);Straight cane Gait Pattern/deviations: Step-through pattern;Decreased step length - left;Decreased stance time - right;Shuffle;Antalgic;Trunk flexed Gait velocity: decreased Gait velocity interpretation: Below normal speed for age/gender General Gait Details: Pt ambulated 100' with cane but unstable to the point of balance loss x 2 requiring assist to prevent fall.  Pt ambulated additional 250' with RW and min guard/SUP noting marked improvement in stability.   Stairs             Wheelchair Mobility    Modified Rankin (Stroke Patients Only)       Balance Overall balance assessment: Needs assistance Sitting-balance support: No upper extremity supported;Feet supported Sitting balance-Leahy Scale: Good     Standing balance support: No upper extremity supported Standing balance-Leahy Scale: Fair                               Pertinent Vitals/Pain Pain Assessment: No/denies pain    Home Living Family/patient expects to be discharged to:: Unsure                 Additional Comments: Pt very vague re: prior living arrangment,     Prior Function Level of Independence: Independent with assistive device(s)         Comments: uses cane     Hand Dominance        Extremity/Trunk Assessment   Upper Extremity Assessment: Generalized weakness           Lower Extremity Assessment: Generalized weakness      Cervical / Trunk Assessment: Kyphotic  Communication   Communication: No difficulties  Cognition Arousal/Alertness: Awake/alert Behavior During Therapy: Impulsive Overall Cognitive Status: Within Functional Limits for tasks assessed                      General Comments      Exercises        Assessment/Plan    PT Assessment Patient needs continued PT services  PT Diagnosis Difficulty walking   PT Problem List Decreased strength;Decreased range of motion;Decreased activity tolerance;Decreased balance;Decreased mobility;Decreased knowledge  of use of DME;Decreased safety awareness  PT Treatment Interventions DME instruction;Gait training;Functional mobility training;Therapeutic activities;Therapeutic exercise;Balance training;Patient/family education   PT Goals (Current goals can be found in the Care Plan section) Acute Rehab PT Goals Patient Stated Goal: Not be so dizzy PT Goal Formulation: With patient Time For Goal Achievement: 10/25/15 Potential to Achieve Goals: Fair    Frequency Min  3X/week   Barriers to discharge Decreased caregiver support Pt provides only vague information regarding support system    Co-evaluation               End of Session Equipment Utilized During Treatment: Gait belt Activity Tolerance: Patient limited by fatigue Patient left: in chair;with call bell/phone within reach;with chair alarm set Nurse Communication: Mobility status         Time: 1545-1606 PT Time Calculation (min) (ACUTE ONLY): 21 min   Charges:   PT Evaluation $PT Eval Low Complexity: 1 Procedure     PT G Codes:        Myron Stankovich 10-16-2015, 5:31 PM

## 2015-10-12 LAB — BASIC METABOLIC PANEL
Anion gap: 6 (ref 5–15)
BUN: 40 mg/dL — AB (ref 6–20)
CALCIUM: 8.8 mg/dL — AB (ref 8.9–10.3)
CHLORIDE: 102 mmol/L (ref 101–111)
CO2: 29 mmol/L (ref 22–32)
CREATININE: 1.61 mg/dL — AB (ref 0.61–1.24)
GFR calc non Af Amer: 42 mL/min — ABNORMAL LOW (ref >60)
GFR, EST AFRICAN AMERICAN: 49 mL/min — AB (ref >60)
Glucose, Bld: 182 mg/dL — ABNORMAL HIGH (ref 65–99)
Potassium: 3.7 mmol/L (ref 3.5–5.1)
SODIUM: 137 mmol/L (ref 135–145)

## 2015-10-12 MED ORDER — POTASSIUM CHLORIDE CRYS ER 10 MEQ PO TBCR
20.0000 meq | EXTENDED_RELEASE_TABLET | Freq: Every day | ORAL | Status: AC
  Administered 2015-10-12 – 2015-10-14 (×3): 20 meq via ORAL
  Filled 2015-10-12 (×6): qty 2

## 2015-10-12 MED ORDER — FUROSEMIDE 40 MG PO TABS
40.0000 mg | ORAL_TABLET | Freq: Every day | ORAL | Status: DC
  Administered 2015-10-13 – 2015-10-15 (×3): 40 mg via ORAL
  Filled 2015-10-12 (×3): qty 1

## 2015-10-12 MED ORDER — LORAZEPAM 2 MG/ML IJ SOLN
1.0000 mg | Freq: Once | INTRAMUSCULAR | Status: AC | PRN
  Administered 2015-10-13: 1 mg via INTRAVENOUS
  Filled 2015-10-12: qty 1
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
## 2015-10-12 NOTE — Progress Notes (Signed)
Ref: [name], MD   Subjective:  C/O thick speech for few months now. Also has dizziness. Afebrile.  Objective:  Vital Signs in the last 24 hours: Temp:  [97.5 F (36.4 C)-97.7 F (36.5 C)] 97.5 F (36.4 C) (07/02 1334) Pulse Rate:  [57-66] 63 (07/02 1334) Cardiac Rhythm:  [-] Sinus bradycardia (07/02 0705) Resp:  [18-20] 18 (07/02 1334) BP: (90-103)/(51-60) 93/53 mmHg (07/02 1334) SpO2:  [94 %-100 %] 100 % (07/02 1334) Weight:  [69.128 kg (152 lb 6.4 oz)] 69.128 kg (152 lb 6.4 oz) (07/02 0600)  Physical Exam: BP Readings from Last 1 Encounters:  10/12/15 93/53    Wt Readings from Last 1 Encounters:  10/12/15 69.128 kg (152 lb 6.4 oz)    Weight change: -5.272 kg (-11 lb 10 oz)  HEENT: [place]/AT, Eyes-Brown, PERL, EOMI, Conjunctiva-Pink, Sclera-Non-icteric Neck: No JVD, No bruit, Trachea midline. Lungs:  Clear, Bilateral. Cardiac:  Regular rhythm, normal S1 and S2, no S3. II/VI systolic murmur. Abdomen:  Soft, non-tender. Extremities:  No edema present. No cyanosis. No clubbing. CNS: AxOx3, Thick speech, moves all 4 extremities. Right handed. Skin: Warm and dry.   Intake/Output from previous day: 07/01 0701 - 07/02 0700 In: 360 [P.O.:360] Out: 3475 [Urine:3475]    Lab Results: BMET    Component Value Date/Time   NA 137 10/12/2015 0434   NA 136 10/11/2015 0521   NA 135 10/10/2015 1923   NA 142 07/31/2014 1244   NA 142 06/26/2014 1008   NA 140 05/13/2014 0937   K 3.7 10/12/2015 0434   K 4.4 10/11/2015 0521   K 5.1 10/10/2015 1923   K 4.3 07/31/2014 1244   K 4.1 06/26/2014 1008   K 3.5 05/13/2014 0937   CL 102 10/12/2015 0434   CL 104 10/11/2015 0521   CL 104 10/10/2015 1923   CO2 29 10/12/2015 0434   CO2 23 10/11/2015 0521   CO2 21* 10/10/2015 1923   CO2 22 07/31/2014 1244   CO2 25 06/26/2014 1008   CO2 22 05/13/2014 0937   GLUCOSE 182* 10/12/2015 0434   GLUCOSE 126* 10/11/2015 0521   GLUCOSE 129* 10/10/2015 1923   GLUCOSE 133 07/31/2014 1244   GLUCOSE 174* 06/26/2014 1008   GLUCOSE 210* 05/13/2014 0937   BUN 40* 10/12/2015 0434   BUN 33* 10/11/2015 0521   BUN 30* 10/10/2015 1923   BUN 18.3 07/31/2014 1244   BUN 8.3 06/26/2014 1008   BUN 9.1 05/13/2014 0937   CREATININE 1.61* 10/12/2015 0434   CREATININE 1.44* 10/11/2015 0521   CREATININE 1.40* 10/10/2015 1923   CREATININE 1.2 07/31/2014 1244   CREATININE 1.1 06/26/2014 1008   CREATININE 1.0 05/13/2014 0937   CALCIUM 8.8* 10/12/2015 0434   CALCIUM 9.5 10/11/2015 0521   CALCIUM 9.7 10/10/2015 1923   CALCIUM 9.2 07/31/2014 1244   CALCIUM 9.0 06/26/2014 1008   CALCIUM 8.9 05/13/2014 0937   GFRNONAA 42* 10/12/2015 0434   GFRNONAA 48* 10/11/2015 0521   GFRNONAA 50* 10/10/2015 1923   GFRAA 49* 10/12/2015 0434   GFRAA 56* 10/11/2015 0521   GFRAA 58* 10/10/2015 1923   CBC    Component Value Date/Time   WBC 27.0* 10/10/2015 1820   WBC 21.3* 07/31/2014 1244   RBC 3.94* 10/10/2015 1820   RBC 2.56* 09/19/2014 0730   RBC 3.58* 07/31/2014 1244   HGB 12.6* 10/10/2015 1820   HGB 10.3* 07/31/2014 1244   HCT 38.1* 10/10/2015 1820   HCT 32.9* 07/31/2014 1244   PLT 524* 10/10/2015 1820  PLT 831* 07/31/2014 1244   MCV 96.7 10/10/2015 1820   MCV 91.9 07/31/2014 1244   MCH 32.0 10/10/2015 1820   MCH 28.9 07/31/2014 1244   MCHC 33.1 10/10/2015 1820   MCHC 31.4* 07/31/2014 1244   RDW 16.7* 10/10/2015 1820   RDW 17.3* 07/31/2014 1244   LYMPHSABS 2.0 10/03/2015 0900   LYMPHSABS 3.6* 07/31/2014 1244   MONOABS 1.4* 10/03/2015 0900   MONOABS 1.2* 07/31/2014 1244   EOSABS 0.3 10/03/2015 0900   EOSABS 0.4 07/31/2014 1244   BASOSABS 0.3* 10/03/2015 0900   BASOSABS 0.0 07/31/2014 1244   HEPATIC Function Panel  Recent Labs  08/20/15 1913 08/25/15 1431 10/10/15 2059  PROT 8.2* 7.0 7.6   HEMOGLOBIN A1C No components found for: HGA1C,  MPG CARDIAC ENZYMES Lab Results  Component Value Date   CKTOTAL 165 09/13/2014   CKMB 2.4 12/17/2010   TROPONINI 0.11* 10/11/2015    TROPONINI 0.12* 10/11/2015   TROPONINI 0.13* 10/11/2015   BNP No results for input(s): PROBNP in the last 8760 hours. TSH  Recent Labs  01/25/15 0341  TSH 0.549   CHOLESTEROL No results for input(s): CHOL in the last 8760 hours.  Scheduled Meds: . ALPRAZolam  0.25 mg Oral BID  . aspirin  81 mg Oral Daily  . atorvastatin  40 mg Oral Daily  . carvedilol  3.125 mg Oral BID WC  . clopidogrel  75 mg Oral Q breakfast  . digoxin  0.125 mg Oral Daily  . [START ON 10/13/2015] furosemide  40 mg Oral Daily  . sacubitril-valsartan  1 tablet Oral BID  . sodium chloride flush  3 mL Intravenous Q12H   Continuous Infusions:  PRN Meds:.sodium chloride, acetaminophen, hydrALAZINE, morphine injection, ondansetron (ZOFRAN) IV, oxyCODONE-acetaminophen, sodium chloride flush  Assessment/Plan: Acute on chronic systolic left heart failure Hypertension Hypercholesterolemia CAD S/P Stroke Chronic myelocytic leukemia CKD, II  Abnormal Troponin-I from demand ischemia Chronic speech problem Dizziness  Decrease lasix dose. MRI/MRA brain without contrast. Neurology consult if abnormal.     LOS: 2 days    Dixie Dials  MD  10/12/2015, 1:46 PM

## 2015-10-12 NOTE — Progress Notes (Signed)
Initial Nutrition Assessment  DOCUMENTATION CODES:   Severe malnutrition in context of chronic illness  INTERVENTION:  -Ensure Enlive po BID, each supplement provides 350 kcal and 20 grams of protein -RD to continue to monitor NUTRITION DIAGNOSIS:   Malnutrition related to chronic illness as evidenced by percent weight loss, severe depletion of body fat, severe depletion of muscle mass.  GOAL:   Patient will meet greater than or equal to 90% of their needs  MONITOR:   PO intake, I & O's, Skin, Labs, Weight trends  REASON FOR ASSESSMENT:   Malnutrition Screening Tool    ASSESSMENT:   Bruce [name] is a 70 y.o. male with medical history significant of sCHF with EF 15%, CML, hypertension, hyperlipidemia, anxiety, CAD, stroke, TIA, Bell's palsy, CKD-II, medication noncompliance, who presents with shortness of breath, nausea, vomiting, abdominal pain.  Spoke with Bruce [name] at bedside. He endorses poor appetite for approximately 1 month, but was unable to quantify amounts of food he was eating PTA. Endorses weight loss, but unable to determine how much. States "I'm getting small."  Per chart he exhibits a 21#/12% severe wt loss in 2 months. Nutrition-Focused physical exam completed. Findings are severe fat depletion, severe muscle depletion, and no edema.  PO intake during stay has not been documented in chart. Pt states he had a chicken sandwich today for lunch, stated "it wasn't a lot of food."  Denies chewing/swallowing problems Denies nausea/vomiting  Labs and Medications reviewed: KCL PO  Diet Order:  Diet 2 gram sodium Room service appropriate?: Yes; Fluid consistency:: Thin  Skin:  Reviewed, no issues  Last BM:  6/29  Height:   Ht Readings from Last 1 Encounters:  10/11/15 5\' 8"  (1.727 m)    Weight:   Wt Readings from Last 1 Encounters:  10/12/15 152 lb 6.4 oz (69.128 kg)    Ideal Body Weight:  70 kg  BMI:  Body mass index is 23.18  kg/(m^2).  Estimated Nutritional Needs:   Kcal:  1700-2000 calories  Protein:  70-85 grams  Fluid:  >/= 1.7L  EDUCATION NEEDS:   No education needs identified at this time  Bruce [name]. Bruce Havener, MS, RD LDN Inpatient Clinical Dietitian Pager [phone]

## 2015-10-13 ENCOUNTER — Inpatient Hospital Stay (HOSPITAL_COMMUNITY): Payer: Medicare Other

## 2015-10-13 LAB — BASIC METABOLIC PANEL
Anion gap: 6 (ref 5–15)
BUN: 28 mg/dL — AB (ref 6–20)
CALCIUM: 8.8 mg/dL — AB (ref 8.9–10.3)
CO2: 28 mmol/L (ref 22–32)
CREATININE: 1.35 mg/dL — AB (ref 0.61–1.24)
Chloride: 103 mmol/L (ref 101–111)
GFR, EST NON AFRICAN AMERICAN: 52 mL/min — AB (ref >60)
Glucose, Bld: 147 mg/dL — ABNORMAL HIGH (ref 65–99)
Potassium: 3.8 mmol/L (ref 3.5–5.1)
SODIUM: 137 mmol/L (ref 135–145)

## 2015-10-13 LAB — ECHOCARDIOGRAM COMPLETE
CHL CUP DOP CALC LVOT VTI: 14.3 cm
EERAT: 13.38
EWDT: 173 ms
FS: 9 % — AB (ref 28–44)
HEIGHTINCHES: 68 in
IVS/LV PW RATIO, ED: 0.9
LA diam index: 2.37 cm/m2
LA vol index: 64.3 mL/m2
LA vol: 122 mL
LASIZE: 45 mm
LAVOLA4C: 101 mL
LEFT ATRIUM END SYS DIAM: 45 mm
LV E/e'average: 13.38
LV SIMPSON'S DISK: 26
LV TDI E'LATERAL: 7.4
LV dias vol: 158 mL — AB (ref 62–150)
LV e' LATERAL: 7.4 cm/s
LVDIAVOLIN: 83 mL/m2
LVEEMED: 13.38
LVOT SV: 49 mL
LVOT area: 3.46 cm2
LVOTD: 21 mm
LVOTPV: 74.6 cm/s
LVSYSVOL: 117 mL — AB (ref 21–61)
LVSYSVOLIN: 62 mL/m2
MV Dec: 173
MV Peak grad: 4 mmHg
MV pk E vel: 99 m/s
MVPKAVEL: 53.4 m/s
PW: 10.4 mm — AB (ref 0.6–1.1)
Reg peak vel: 353 cm/s
Stroke v: 41 ml
TDI e' medial: 4.68
TR max vel: 353 cm/s
WEIGHTICAEL: 2624.36 [oz_av]

## 2015-10-13 NOTE — Clinical Social Work Note (Signed)
Clinical Social Work Assessment  Patient Details  Name: Bruce [name] MRN: [id number] Date of Birth: 1945/08/24  Date of referral:  10/13/15               Reason for consult:  Housing Concerns/Homelessness                Permission sought to share information with:    Permission granted to share information::     Name::        Agency::     Relationship::     Contact Information:     Housing/Transportation Living arrangements for the past 2 months:  Barrister's clerk of Information:  Patient Patient Interpreter Needed:  None Criminal Activity/Legal Involvement Pertinent to Current Situation/Hospitalization:    Significant Relationships:  None Lives with:  Self Do you feel safe going back to the place where you live?    Need for family participation in patient care:     Care giving concerns:  CSW received consult to speak with pt regarding community services.    Social Worker assessment / plan:  CSW spoke with pt regarding his current living situation. Pt is currently living in a homeless shelter and has been living in the shelter for a few months. Pt states that he has previously lived in his car and in friends apartments but he has been unable to find secure housing at this time. CSW offered to give pt information on free food pantries, free meals, and the [facility] but pt did not want information. Pt stated he has a Case Manager at the shelter he is staying at and wanted the CSW to help him find permanent housing. CSW told pt that his Case Manager would be his best resource to find permanent housing.   Employment status:  Unemployed Forensic scientist:  Medicare PT Recommendations:  No Follow Up Information / Referral to community resources:  Shelter  Patient/Family's Response to care:  Pt was appreciative of CSW.  Patient/Family's Understanding of and Emotional Response to Diagnosis, Current Treatment, and Prognosis:  Pt seemed to understand that his Case Manager could  help him find permanent housing. Pt had already received some of the resources CSW wanted to give him. Pt stayed he did not like the [facility] and would not take any information on it.   Emotional Assessment Appearance:  Appears stated age Attitude/Demeanor/Rapport:    Affect (typically observed):  Adaptable, Blunt, Appropriate Orientation:  Oriented to Self, Oriented to Place, Oriented to  Time, Oriented to Situation Alcohol / Substance use:    Psych involvement (Current and /or in the community):  No (Comment)  Discharge Needs  Concerns to be addressed:  Basic Needs Readmission within the last 30 days:  No Current discharge risk:  Homeless Barriers to Discharge:  No Barriers Identified   Weston Anna, LCSW 10/13/2015, 12:55 PM

## 2015-10-13 NOTE — Progress Notes (Signed)
Occupational Therapy Treatment Patient Details Name: Bruce [name] MRN: [id number] DOB: 05-Feb-1946 Today's Date: 10/13/2015    History of present illness Pt admitted through ED with c/o weakness/SOB and hx of CAD, CVA, TIA, CKD, R hip ORIF, back surgery and leukemia   OT comments  Pt with limited awareness of deficits  Follow Up Recommendations  SNF    Equipment Recommendations  Other (comment) (defer to SNF)    Recommendations for Other Services      Precautions / Restrictions Precautions Precautions: Fall Precaution Comments: LOB multiple times during session and lack of awareness       Mobility Bed Mobility Overal bed mobility: Modified Independent                Transfers Overall transfer level: Needs assistance Equipment used: Rolling walker (2 wheeled) Transfers: Sit to/from Stand Sit to Stand: Mod assist         General transfer comment: verbal cues for hand placement, cue to wait for dizziness to subside prior to ambulating    Balance                                   ADL Overall ADL's : Needs assistance/impaired     Grooming: Wash/dry hands;Wash/dry face;Sitting;Minimal assistance Grooming Details (indicate cue type and reason): sitting EOB                 Toilet Transfer: Moderate assistance Toilet Transfer Details (indicate cue type and reason): sit to stand Toileting- Clothing Manipulation and Hygiene: Moderate assistance;Sit to/from stand;Cueing for sequencing;Cueing for safety         General ADL Comments: pt with decreased awareness of deficits                 Cognition   Behavior During Therapy: Impulsive Overall Cognitive Status: Impaired/Different from baseline Area of Impairment: Safety/judgement          Safety/Judgement: Decreased awareness of deficits;Decreased awareness of safety     General Comments: Pt with lack of awareness to fall risk and need for assistance                  Pertinent Vitals/ Pain       Pain Assessment: No/denies pain         Frequency Min 2X/week     Progress Toward Goals  OT Goals(current goals can now be found in the care plan section)  Progress towards OT goals: Progressing toward goals     Plan Discharge plan remains appropriate       End of Session Equipment Utilized During Treatment: Gait belt   Activity Tolerance Patient tolerated treatment well   Patient Left in chair;with call bell/phone within reach;with chair alarm set   Nurse Communication Mobility status;Precautions        Time: [id number] OT Time Calculation (min): 15 min  Charges: OT General Charges $OT Visit: 1 Procedure OT Treatments $Self Care/Home Management : 8-22 mins  Tagen Brethauer, Thereasa Parkin 10/13/2015, 3:51 PM

## 2015-10-13 NOTE — Progress Notes (Signed)
1mg  of ativan given prior to MRI for anxiety, pt unable to complete MRI due to anxiety.  Dr. Terrence Dupont made aware. Will follow out any new orders.

## 2015-10-13 NOTE — Progress Notes (Signed)
Physical Therapy Treatment Patient Details Name: Bruce [name] MRN: [id number] DOB: Mar 03, 1946 Today's Date: 10/22/2015    History of Present Illness Pt admitted through ED with c/o weakness/SOB and hx of CAD, CVA, TIA, CKD, R hip ORIF, back surgery and leukemia    PT Comments    Pt reports intermittent dizziness however poor ability to provide specifics about dizziness when questioned further.  Pt presents with balance deficits and high fall risk therefore recommend 24/7 assist upon d/c.  If not available at home then pt may benefit from ST-SNF.   Follow Up Recommendations  Supervision/Assistance - 24 hour;Home health PT     Equipment Recommendations  Rolling walker with 5" wheels    Recommendations for Other Services       Precautions / Restrictions Precautions Precautions: Fall Precaution Comments: LOB multiple times during session and lack of awareness    Mobility  Bed Mobility Overal bed mobility: Modified Independent                Transfers Overall transfer level: Needs assistance Equipment used: Rolling walker (2 wheeled) Transfers: Sit to/from Stand Sit to Stand: Min guard         General transfer comment: verbal cues for hand placement, cue to wait for dizziness to subside prior to ambulating  Ambulation/Gait Ambulation/Gait assistance: Min assist Ambulation Distance (Feet): 280 Feet Assistive device: Rolling walker (2 wheeled) Gait Pattern/deviations: Step-through pattern;Narrow base of support;Scissoring;Decreased stride length;Staggering right;Staggering left Gait velocity: decreased   General Gait Details: required seated rest break halfway, occasional scissoring requiring min assist to correct balance, assist for any challenge to balance   Stairs            Wheelchair Mobility    Modified Rankin (Stroke Patients Only)       Balance                                    Cognition Arousal/Alertness:  Awake/alert Behavior During Therapy: Impulsive Overall Cognitive Status: Impaired/Different from baseline Area of Impairment: Safety/judgement         Safety/Judgement: Decreased awareness of deficits;Decreased awareness of safety     General Comments: Pt with lack of awareness to fall risk and need for assistance    Exercises      General Comments        Pertinent Vitals/Pain Pain Assessment: No/denies pain    Home Living                      Prior Function            PT Goals (current goals can now be found in the care plan section) Progress towards PT goals: Progressing toward goals    Frequency  Min 3X/week    PT Plan Current plan remains appropriate    Co-evaluation             End of Session Equipment Utilized During Treatment: Gait belt Activity Tolerance: Patient limited by fatigue Patient left: with call bell/phone within reach;in bed;with bed alarm set     Time: 1347-1400 PT Time Calculation (min) (ACUTE ONLY): 13 min  Charges:  $Gait Training: 8-22 mins                    G Codes:      Bruce [name],KATHrine E 2015-10-22, 2:19 PM Carmelia Bake, PT, DPT 22-Oct-2015 Pager: [phone]

## 2015-10-13 NOTE — Progress Notes (Signed)
Subjective:  Patient denies any chest pain .  States breathing is improved.  Denies any weakness in the arms or legs  Objective:  Vital Signs in the last 24 hours: Temp:  [97.7 F (36.5 C)] 97.7 F (36.5 C) (07/03 0557) Pulse Rate:  [58-64] 64 (07/03 1130) Resp:  [20] 20 (07/03 0557) BP: (102-109)/(63-66) 109/66 mmHg (07/03 0557) SpO2:  [99 %-100 %] 100 % (07/03 0557) Weight:  [68.856 kg (151 lb 12.8 oz)] 68.856 kg (151 lb 12.8 oz) (07/03 0557)  Intake/Output from previous day: 07/02 0701 - 07/03 0700 In: 360 [P.O.:360] Out: 3075 [Urine:3075] Intake/Output from this shift:    Physical Exam: Neck: no adenopathy, no carotid bruit, no JVD and supple, symmetrical, trachea midline Lungs: decreased breath sounds at bases Heart: regular rate and rhythm, S1, S2 normal and 2/6systolic murmur noted Abdomen: soft, non-tender; bowel sounds normal; no masses,  no organomegaly Extremities: extremities normal, atraumatic, no cyanosis or edema  Lab Results:  Recent Labs  10/10/15 1820  WBC 27.0*  HGB 12.6*  PLT 524*    Recent Labs  10/12/15 0434 10/13/15 0459  NA 137 137  K 3.7 3.8  CL 102 103  CO2 29 28  GLUCOSE 182* 147*  BUN 40* 28*  CREATININE 1.61* 1.35*    Recent Labs  10/11/15 0521 10/11/15 1130  TROPONINI 0.12* 0.11*   Hepatic Function Panel  Recent Labs  10/10/15 2059  PROT 7.6  ALBUMIN 3.9  AST 32  ALT 20  ALKPHOS 6*  BILITOT 2.8*  BILIDIR 1.3*  IBILI 1.5*   No results for input(s): CHOL in the last 72 hours. No results for input(s): PROTIME in the last 72 hours.  Imaging: Imaging results have been reviewed and No results found.  Cardiac Studies:  Assessment/Plan:  Acute on chronic systolic left heart failure Hypertension Hypercholesterolemia nonobstructiveCAD Chronic myelocytic leukemia CKD, II  Abnormal Troponin-I from demand ischemia Chronic speech problem Dizziness//status post questionable TIA. History of CVA 2 in the  past. History of EtOH abuse and tobacco abuse Plan Continue present management. OT, PT consult. Check labs in a.m.  LOS: 3 days    Bruce [name] 10/13/2015, 2:01 PM

## 2015-10-14 LAB — BRAIN NATRIURETIC PEPTIDE: B NATRIURETIC PEPTIDE 5: 218.6 pg/mL — AB (ref 0.0–100.0)

## 2015-10-14 LAB — BASIC METABOLIC PANEL
Anion gap: 8 (ref 5–15)
BUN: 29 mg/dL — ABNORMAL HIGH (ref 6–20)
CHLORIDE: 103 mmol/L (ref 101–111)
CO2: 26 mmol/L (ref 22–32)
Calcium: 8.7 mg/dL — ABNORMAL LOW (ref 8.9–10.3)
Creatinine, Ser: 1.21 mg/dL (ref 0.61–1.24)
GFR calc non Af Amer: 59 mL/min — ABNORMAL LOW (ref >60)
Glucose, Bld: 163 mg/dL — ABNORMAL HIGH (ref 65–99)
POTASSIUM: 4.1 mmol/L (ref 3.5–5.1)
SODIUM: 137 mmol/L (ref 135–145)

## 2015-10-14 NOTE — Progress Notes (Signed)
Subjective:  Patient complains of generalized weakness states feels not strong enough to go home today. Denies any chest pain states breathing has improved. BNP trending down  Objective:  Vital Signs in the last 24 hours: Temp:  [97.6 F (36.4 C)-98.5 F (36.9 C)] 98.5 F (36.9 C) (07/04 0355) Pulse Rate:  [63-78] 73 (07/04 0759) Resp:  [16-20] 16 (07/04 0759) BP: (87-122)/(43-80) 92/43 mmHg (07/04 0759) SpO2:  [97 %-100 %] 97 % (07/04 0759) Weight:  [70.126 kg (154 lb 9.6 oz)] 70.126 kg (154 lb 9.6 oz) (07/04 0355)  Intake/Output from previous day: 07/03 0701 - 07/04 0700 In: 72 [P.O.:780] Out: 1100 [Urine:1100] Intake/Output from this shift:    Physical Exam: Neck: no adenopathy, no carotid bruit, no JVD and supple, symmetrical, trachea midline Lungs: Decreased breath sound at bases Heart: regular rate and rhythm, S1, S2 normal and No S3 gallop Abdomen: regular rate and rhythm, S1, S2 normal and Soft systolic murmur noted Extremities: extremities normal, atraumatic, no cyanosis or edema  Lab Results: No results for input(s): WBC, HGB, PLT in the last 72 hours.  Recent Labs  10/13/15 0459 10/14/15 0443  NA 137 137  K 3.8 4.1  CL 103 103  CO2 28 26  GLUCOSE 147* 163*  BUN 28* 29*  CREATININE 1.35* 1.21    Recent Labs  10/11/15 1130  TROPONINI 0.11*   Hepatic Function Panel No results for input(s): PROT, ALBUMIN, AST, ALT, ALKPHOS, BILITOT, BILIDIR, IBILI in the last 72 hours. No results for input(s): CHOL in the last 72 hours. No results for input(s): PROTIME in the last 72 hours.  Imaging: Imaging results have been reviewed and No results found.  Cardiac Studies:  Assessment/Plan:   ResolvingAcute on chronic systolic left heart failure Hypertension Hypercholesterolemia nonobstructiveCAD Chronic myelocytic leukemia CKD, II  Abnormal Troponin-I from demand ischemia Chronic speech problem Dizziness//status post questionable TIA. History of CVA 2  in the past. History of EtOH abuse and tobacco abuse Plan Continue present management Increase ambulation as tolerated Possible discharge tomorrow  LOS: 4 days    [name] 10/14/2015, 8:43 AM

## 2015-10-15 ENCOUNTER — Other Ambulatory Visit: Payer: Self-pay

## 2015-10-15 MED ORDER — AMIODARONE IV BOLUS ONLY 150 MG/100ML
150.0000 mg | Freq: Once | INTRAVENOUS | Status: AC
  Administered 2015-10-15: 150 mg via INTRAVENOUS
  Filled 2015-10-15: qty 100

## 2015-10-15 MED ORDER — AMIODARONE HCL 200 MG PO TABS: 200.0000 mg | ORAL_TABLET | Freq: Every day | ORAL | Status: DC

## 2015-10-15 MED ORDER — BISACODYL 5 MG PO TBEC: 10.0000 mg | DELAYED_RELEASE_TABLET | Freq: Every day | ORAL | Status: DC | PRN

## 2015-10-15 MED ORDER — AMIODARONE HCL 200 MG PO TABS
200.0000 mg | ORAL_TABLET | Freq: Every day | ORAL | Status: AC
  Administered 2015-10-15: 200 mg via ORAL
  Filled 2015-10-15: qty 1

## 2015-10-15 MED ORDER — AMIODARONE HCL IN DEXTROSE 360-4.14 MG/200ML-% IV SOLN
60.0000 mg/h | INTRAVENOUS | Status: AC
  Administered 2015-10-15: 60 mg/h via INTRAVENOUS
  Filled 2015-10-15: qty 200

## 2015-10-15 MED ORDER — AMIODARONE HCL IN DEXTROSE 360-4.14 MG/200ML-% IV SOLN: 30.0000 mg/h | INTRAVENOUS | Status: DC

## 2015-10-15 MED ORDER — BISACODYL 5 MG PO TBEC
10.0000 mg | DELAYED_RELEASE_TABLET | Freq: Every day | ORAL | Status: DC | PRN
  Administered 2015-10-15: 10 mg via ORAL
  Filled 2015-10-15: qty 2

## 2015-10-15 MED FILL — AMIODARONE HCL 200 MG TABLE: 200 | 30 days supply | Qty: 30 | Fill #0

## 2015-10-15 NOTE — NC FL2 (Signed)
[place] LEVEL OF CARE SCREENING TOOL     IDENTIFICATION  Patient Name: Bruce [name] Birthdate: 06/25/1945 Sex: male Admission Date (Current Location): 10/10/2015  [facility] and [place] Number:  Herbalist and Address:  [facility],  [place] [address] [employer]      Provider Number: [id number]  Attending Physician Name and Address:  [name], MD  Relative Name and Phone Number:       Current Level of Care: Hospital Recommended Level of Care: [place] Prior Approval Number:    Date Approved/Denied:   PASRR Number: [id number]  Discharge Plan: SNF    Current Diagnoses: Patient Active Problem List   Diagnosis Date Noted  . Elevated lactic acid level 10/11/2015  . Nausea & vomiting 10/10/2015  . CHF exacerbation ([place]) 10/10/2015  . CKD (chronic kidney disease), stage II   . Acute on chronic systolic (congestive) heart failure ([place]) 06/03/2015  . Dizziness 01/25/2015  . Acute on chronic combined systolic and diastolic CHF (congestive heart failure) ([place]) 01/25/2015  . Essential hypertension 01/25/2015  . Compliance poor 01/25/2015  . Coronary artery disease   . Cardiomyopathy, ischemic 09/24/2014  . H/O: stroke with residual effects 09/13/2014  . Transaminitis 09/13/2014  . Acute kidney injury ([place]) 09/13/2014  . Abdominal pain 09/13/2014  . Elevated troponin 09/13/2014  . TIA (transient ischemic attack) 05/10/2014  . CML (chronic myelocytic leukemia) ([place]) 03/27/2014  . Leukocytosis 03/18/2014  . Chest pain 03/05/2014    Orientation RESPIRATION BLADDER Height & Weight     Self, Time, Situation, Place  Normal Continent Weight: 158 lb 8 oz (71.895 kg) Height:  5\' 8"  (172.7 cm)  BEHAVIORAL SYMPTOMS/MOOD NEUROLOGICAL BOWEL NUTRITION STATUS      Continent Diet (2 gram sodium)  AMBULATORY STATUS COMMUNICATION OF NEEDS Skin   Limited Assist Verbally                         Personal Care  Assistance Level of Assistance  Bathing, Dressing Bathing Assistance: Limited assistance   Dressing Assistance: Limited assistance     Functional Limitations Info             SPECIAL CARE FACTORS FREQUENCY  PT (By licensed PT), OT (By licensed OT)     PT Frequency: 5 OT Frequency: 5            Contractures      Additional Factors Info  Code Status, Allergies Code Status Info: full code Allergies Info: NKA           Current Medications (10/15/2015):  This is the current hospital active medication list Current Facility-Administered Medications  Medication Dose Route Frequency Provider Last Rate Last Dose  . 0.9 %  sodium chloride infusion  250 mL Intravenous PRN Ivor Costa, MD      . acetaminophen (TYLENOL) tablet 650 mg  650 mg Oral Q4H PRN Ivor Costa, MD      . ALPRAZolam Duanne Moron) tablet 0.25 mg  0.25 mg Oral BID Ivor Costa, MD   0.25 mg at 10/15/15 1102  . amiodarone (NEXTERONE PREMIX) 360-4.14 MG/200ML-% (1.8 mg/mL) IV infusion  30 mg/hr Intravenous Continuous [name], MD   Stopped at 10/15/15 1045  . aspirin chewable tablet 81 mg  81 mg Oral Daily Ivor Costa, MD   81 mg at 10/15/15 1102  . atorvastatin (LIPITOR) tablet 40 mg  40 mg Oral Daily Ivor Costa, MD   40 mg  at 10/15/15 1102  . bisacodyl (DULCOLAX) EC tablet 10 mg  10 mg Oral Daily PRN [name], MD   10 mg at 10/15/15 1101  . carvedilol (COREG) tablet 3.125 mg  3.125 mg Oral BID WC Ivor Costa, MD   3.125 mg at 10/15/15 [id number]  . clopidogrel (PLAVIX) tablet 75 mg  75 mg Oral Q breakfast Ivor Costa, MD   75 mg at 10/15/15 [id number]  . digoxin (LANOXIN) tablet 0.125 mg  0.125 mg Oral Daily Ivor Costa, MD   0.125 mg at 10/15/15 1101  . furosemide (LASIX) tablet 40 mg  40 mg Oral Daily Dixie Dials, MD   40 mg at 10/15/15 1102  . hydrALAZINE (APRESOLINE) injection 5 mg  5 mg Intravenous Q2H PRN Ivor Costa, MD      . morphine 2 MG/ML injection 2 mg  2 mg Intravenous Q4H PRN Ivor Costa, MD   2 mg at 10/11/15 0206  .  ondansetron (ZOFRAN) injection 4 mg  4 mg Intravenous Q6H PRN Ritta Slot, NP   4 mg at 10/11/15 0206  . oxyCODONE-acetaminophen (PERCOCET/ROXICET) 5-325 MG per tablet 2 tablet  2 tablet Oral Q4H PRN Ivor Costa, MD   2 tablet at 10/15/15 [phone]  . sacubitril-valsartan (ENTRESTO) 24-26 mg per tablet  1 tablet Oral BID Ivor Costa, MD   1 tablet at 10/15/15 1103  . sodium chloride flush (NS) 0.9 % injection 3 mL  3 mL Intravenous Q12H Ivor Costa, MD   3 mL at 10/14/15 2208  . sodium chloride flush (NS) 0.9 % injection 3 mL  3 mL Intravenous PRN Ivor Costa, MD   3 mL at 10/12/15 [id number]     Discharge Medications: Please see discharge summary for a list of discharge medications.  Relevant Imaging Results:  Relevant Lab Results:   Additional [place], LCSW

## 2015-10-15 NOTE — Care Management Important Message (Signed)
Important Message  Patient Details  Name: JEREMEE DOMEN MRN: [id number] Date of Birth: Mar 26, 1946   Medicare Important Message Given:  Yes    Camillo Flaming 10/15/2015, 11:27 AMImportant Message  Patient Details  Name: RODNIE MIDDENDORF MRN: [id number] Date of Birth: 04-Dec-1945   Medicare Important Message Given:  Yes    Camillo Flaming 10/15/2015, 11:27 AM

## 2015-10-15 NOTE — Progress Notes (Signed)
Patient refusing SNF placement.  Patient states he would like to go back to the shelter.  Patient states he is going to drive himself back to the shelter.  Walked with pateint in the hall, gait is steady and he reports no dizziness.  Pt states he feels able to drive.  Went over discharge summary.  All questions answered.   Made pt aware medications were sent to [facility].  Pt wheeled out by NT.

## 2015-10-15 NOTE — Discharge Summary (Signed)
Prior to discharge summary dictated on 10/15/2015 dictation number is [phone]

## 2015-10-15 NOTE — Discharge Instructions (Signed)

## 2015-10-15 NOTE — Progress Notes (Signed)
Physical Therapy Treatment Patient Details Name: Bruce [name] MRN: [id number] DOB: November 04, 1945 Today's Date: 10/15/2015    History of Present Illness Pt admitted through ED with c/o weakness/SOB and hx of CAD, CVA, TIA, CKD, R hip ORIF, back surgery and leukemia    PT Comments    Assisted OOB to amb.  Pt still c/o dizziness.  Orthostatic BP's no sig change.   RN in room at time.  Assisted with amb a great distance in hallway using RW vs cand due to instability.    Follow Up Recommendations  Supervision/Assistance - 24 hour;Home health PT     Equipment Recommendations  Rolling walker with 5" wheels    Recommendations for Other Services       Precautions / Restrictions Precautions Precautions: Fall Restrictions Weight Bearing Restrictions: No    Mobility  Bed Mobility Overal bed mobility: Modified Independent                Transfers Overall transfer level: Needs assistance Equipment used: Rolling walker (2 wheeled) Transfers: Sit to/from Stand Sit to Stand: Supervision;Min guard         General transfer comment: VC's for safety.  Mild c/o dizziness.  orthostatic BP's taken with no/little drop.    Ambulation/Gait Ambulation/Gait assistance: Min assist Ambulation Distance (Feet): 145 Feet Assistive device: Rolling walker (2 wheeled) Gait Pattern/deviations: Step-to pattern;Step-through pattern Gait velocity: WFL   General Gait Details: mild unsteady gait esp with turns.  Pt stated he normally uses a cane.  Pt drifts R and L.  Demonstares delayed corrective reaction.  HIGH FALL RISK.    Stairs            Wheelchair Mobility    Modified Rankin (Stroke Patients Only)       Balance                                    Cognition Arousal/Alertness: Awake/alert Behavior During Therapy: WFL for tasks assessed/performed Overall Cognitive Status: Within Functional Limits for tasks assessed                      Exercises       General Comments        Pertinent Vitals/Pain Pain Assessment: No/denies pain    Home Living                      Prior Function            PT Goals (current goals can now be found in the care plan section) Progress towards PT goals: Progressing toward goals    Frequency  Min 3X/week    PT Plan Current plan remains appropriate    Co-evaluation             End of Session Equipment Utilized During Treatment: Gait belt Activity Tolerance: Patient limited by fatigue Patient left: in chair;with chair alarm set     Time: [id number] PT Time Calculation (min) (ACUTE ONLY): 23 min  Charges:  $Gait Training: 8-22 mins $Therapeutic Activity: 8-22 mins                    G Codes:      Rica Koyanagi  PTA [facility]  Acute  Rehab Pager      [phone]

## 2015-10-15 NOTE — Progress Notes (Signed)
Pt is homeless and selected not to go to SNF.  Pt is unable to have [place] at present time related to pt being homeless.

## 2015-10-16 LAB — CULTURE, BLOOD (ROUTINE X 2)
CULTURE: NO GROWTH
CULTURE: NO GROWTH

## 2015-10-16 NOTE — Discharge Summary (Signed)
Bruce [name]             ACCOUNT NO.:  [id number]  MEDICAL RECORD NO.:  [id number]  LOCATION:  79                         FACILITY:  [facility]  PHYSICIAN:  Sharmila Wrobleski N. Terrence [name], M.D. DATE OF BIRTH:  Aug 23, 1945  DATE OF ADMISSION:  10/10/2015 DATE OF DISCHARGE:  10/15/2015                              DISCHARGE SUMMARY   ADMITTING DIAGNOSES: 1. Acute on chronic systolic congestive heart failure. 2. Hypertension. 3. Coronary artery disease. 4. Chronic kidney disease, stage 2. 5. Chronic myeloid leukemia. 6. History of cerebrovascular accident x2 in the past. 7. Transient ischemic attack. 8. Abdominal pain. 9. Minimally elevated troponin-I secondary to demand ischemia.  FINAL DIAGNOSES: 1. Compensated systolic congestive heart failure. 2. Hypertension. 3. Hyperlipidemia. 4. Nonobstructive coronary artery disease. 5. Chronic myeloid leukemia. 6. Status post paroxysmal atrial tachycardia. 7. Chronic kidney disease, stage 2. 8. Minimally elevated troponin-I secondary to demand ischemia. 9. History of cerebrovascular accident x2. 10.Status post possible transient ischemic attack. 11.History of alcohol abuse, history of tobacco abuse.  DISCHARGE MEDICATIONS: 1. Amiodarone 200 mg one tablet daily. 2. Dulcolax two tablets for moderate constipation as needed. 3. Tylenol 650 mg every 4 hours as needed for pain. 4. Albuterol inhaler 2 puffs every 4 hours as needed as before. 5. Xanax 0.25 mg twice daily. 6. Aspirin 81 mg one tablet daily. 7. Atorvastatin 40 mg daily. 8. Clopidogrel 75 mg daily. 9. Sprycel 100 mg daily as before. 10.Digoxin 0.125 mg daily. 11.Entresto 24/26 mg twice daily. 12.Lasix 80 mg daily. 13.Percocet 1-2 tablets every 4 hours as needed as before. 14.Potassium chloride 20 mEq daily. The patient has been advised to stop carvedilol for now, stop BiDil, stop lisinopril.  DIET:  Low salt, low cholesterol.  ACTIVITY:  Increase activity slowly as  tolerated.  The patient will be transferred to skilled nursing facility for rehab.  CONDITION AT DISCHARGE:  Stable.  BRIEF HISTORY AND HOSPITAL COURSE:  Bruce [name] is a 70 year old male with past medical history significant for systolic congestive heart failure, EF approximately 15%, hypertension, chronic myeloid leukemia, hyperlipidemia, anxiety disorder, nonobstructive CAD, history of CVA x2, TIA, chronic kidney disease, noncompliant to medication, who was admitted by triad hospitalist on June 30 because of progressive increasing shortness of breath associated with nausea, vomiting, abdominal pain, and swelling.  The patient reports that he has been having shortness of breath for several months which has worsened recently.  He also has orthopnea and generalized weakness.  He also has cough with whitish mucus, intermittent mild chest pain.  No chest pain recently.  He also complains of vague abdominal pain associated with nausea and vomiting.  Denies any fever or chills.  Denies any urinary complaints.  The patient was found to have BNP of 4251 with elevated white count and slightly worsening renal function.  The patient also had CT of the abdomen without contrast which showed small ascites, diffuse mesenteric edema, anasarca, cholelithiasis.  The patient was admitted for further evaluation and treatment.  PHYSICAL EXAMINATION:  GENERAL:  On examination, he was alert, awake, oriented x3. VITAL SIGNS:  Blood pressure was 133/92, pulse 92. HEENT:  Conjunctivae were pink.  Sclerae nonicteric. NECK:  Positive JVD. CARDIOVASCULAR:  S1, S2 normal.  There was no murmur, gallop, or rub. LUNGS:  Clear to auscultation. ABDOMEN:  Distended, diffuse tenderness, no rebound. EXTREMITIES:  There was no pitting edema. NEUROLOGIC:  Grossly intact.  LABORATORY DATA:  His sodium was 135, potassium 5.1, BUN 30, creatinine 1.40.  BNP was 4251.  Troponin I first set was 0.05.  Repeat troponin  I have minimally elevated, 0.13, 0.12, 0.11.  Repeat BNP yesterday was 218 which has markedly come down.  His last electrolytes:  Sodium 137, potassium 4.1, BUN 29, creatinine 1.21.  Blood sugar was 163.  The patient had EKG earlier this morning which showed atrial tachycardia, was started on IV amiodarone with spontaneous conversion to normal sinus rhythm.  BRIEF HOSPITAL COURSE:  The patient was admitted to telemetry unit.  MI was ruled out by serial enzymes and EKG.  The patient did not have any further episodes of chest pain during the hospital stay.  He was started on IV Lasix with good diuresis which was switched to p.o.  The patient had episode of atrial tachycardia requiring IV amiodarone which is switched to p.o. with conversion to sinus rhythm. The patient has remained in sinus rhythm with sinus bradycardia.  His carvedilol dose has been held because of bradycardia.  The patient will be discharged to skilled nursing facility on above medications for short- term rehab and will be followed up in my office in one week.     Bruce [name], M.D.     MNH/MEDQ  D:  10/15/2015  T:  10/16/2015  Job:  [id number]

## 2015-10-20 ENCOUNTER — Inpatient Hospital Stay (HOSPITAL_COMMUNITY)
Admission: RE | Admit: 2015-10-20 | Discharge: 2015-10-20 | Disposition: A | Discharge status: Home / Self Care | Payer: Medicare Other | Source: Ambulatory Visit

## 2015-10-30 ENCOUNTER — Encounter (HOSPITAL_COMMUNITY): Payer: Self-pay | Admitting: *Deleted

## 2015-10-30 ENCOUNTER — Emergency Department (HOSPITAL_COMMUNITY)
Admission: EM | Admit: 2015-10-30 | Discharge: 2015-10-30 | Disposition: A | Discharge status: Home / Self Care | Payer: Medicare Other | Attending: Emergency Medicine | Admitting: Emergency Medicine

## 2015-10-30 ENCOUNTER — Emergency Department (HOSPITAL_COMMUNITY): Payer: Medicare Other

## 2015-10-30 DIAGNOSIS — I2510 Atherosclerotic heart disease of native coronary artery without angina pectoris: Secondary | ICD-10-CM | POA: Insufficient documentation

## 2015-10-30 DIAGNOSIS — R059 Cough, unspecified: Secondary | ICD-10-CM

## 2015-10-30 DIAGNOSIS — N182 Chronic kidney disease, stage 2 (mild): Secondary | ICD-10-CM | POA: Diagnosis not present

## 2015-10-30 DIAGNOSIS — R369 Urethral discharge, unspecified: Secondary | ICD-10-CM | POA: Diagnosis not present

## 2015-10-30 DIAGNOSIS — R05 Cough: Secondary | ICD-10-CM | POA: Diagnosis present

## 2015-10-30 DIAGNOSIS — R0789 Other chest pain: Secondary | ICD-10-CM | POA: Insufficient documentation

## 2015-10-30 DIAGNOSIS — Z87891 Personal history of nicotine dependence: Secondary | ICD-10-CM | POA: Diagnosis not present

## 2015-10-30 DIAGNOSIS — I129 Hypertensive chronic kidney disease with stage 1 through stage 4 chronic kidney disease, or unspecified chronic kidney disease: Secondary | ICD-10-CM | POA: Diagnosis not present

## 2015-10-30 DIAGNOSIS — Z79899 Other long term (current) drug therapy: Secondary | ICD-10-CM | POA: Diagnosis not present

## 2015-10-30 DIAGNOSIS — Z7902 Long term (current) use of antithrombotics/antiplatelets: Secondary | ICD-10-CM | POA: Diagnosis not present

## 2015-10-30 DIAGNOSIS — R0602 Shortness of breath: Secondary | ICD-10-CM | POA: Diagnosis not present

## 2015-10-30 DIAGNOSIS — Z7982 Long term (current) use of aspirin: Secondary | ICD-10-CM | POA: Insufficient documentation

## 2015-10-30 DIAGNOSIS — Z8673 Personal history of transient ischemic attack (TIA), and cerebral infarction without residual deficits: Secondary | ICD-10-CM | POA: Insufficient documentation

## 2015-10-30 LAB — COMPREHENSIVE METABOLIC PANEL
ALBUMIN: 4.3 g/dL (ref 3.5–5.0)
ALK PHOS: 228 U/L — AB (ref 38–126)
ALT: 19 U/L (ref 17–63)
ANION GAP: 9 (ref 5–15)
AST: 30 U/L (ref 15–41)
BILIRUBIN TOTAL: 1.5 mg/dL — AB (ref 0.3–1.2)
BUN: 16 mg/dL (ref 6–20)
CALCIUM: 9.9 mg/dL (ref 8.9–10.3)
CO2: 23 mmol/L (ref 22–32)
Chloride: 108 mmol/L (ref 101–111)
Creatinine, Ser: 1.05 mg/dL (ref 0.61–1.24)
GFR calc Af Amer: >60 mL/min (ref >60)
GFR calc non Af Amer: >60 mL/min (ref >60)
GLUCOSE: 116 mg/dL — AB (ref 65–99)
Potassium: 4.1 mmol/L (ref 3.5–5.1)
Sodium: 140 mmol/L (ref 135–145)
TOTAL PROTEIN: 8 g/dL (ref 6.5–8.1)

## 2015-10-30 LAB — CBC WITH DIFFERENTIAL/PLATELET
BASOS ABS: 0.5 10*3/uL — AB (ref 0.0–0.1)
Basophils Relative: 2 %
EOS ABS: 0.5 10*3/uL (ref 0.0–0.7)
Eosinophils Relative: 2 %
HEMATOCRIT: 31.5 % — AB (ref 39.0–52.0)
HEMOGLOBIN: 10.2 g/dL — AB (ref 13.0–17.0)
LYMPHS ABS: 2.5 10*3/uL (ref 0.7–4.0)
Lymphocytes Relative: 11 %
MCH: 31.9 pg (ref 26.0–34.0)
MCHC: 32.4 g/dL (ref 30.0–36.0)
MCV: 98.4 fL (ref 78.0–100.0)
MONO ABS: 1.6 10*3/uL — AB (ref 0.1–1.0)
Monocytes Relative: 7 %
NEUTROS PCT: 78 %
Neutro Abs: 17.7 10*3/uL — ABNORMAL HIGH (ref 1.7–7.7)
Platelets: 637 10*3/uL — ABNORMAL HIGH (ref 150–400)
RBC: 3.2 MIL/uL — AB (ref 4.22–5.81)
RDW: 15.8 % — ABNORMAL HIGH (ref 11.5–15.5)
WBC: 22.8 10*3/uL — AB (ref 4.0–10.5)

## 2015-10-30 LAB — URINALYSIS, ROUTINE W REFLEX MICROSCOPIC
Bilirubin Urine: NEGATIVE
GLUCOSE, UA: NEGATIVE mg/dL
HGB URINE DIPSTICK: NEGATIVE
Ketones, ur: NEGATIVE mg/dL
Leukocytes, UA: NEGATIVE
Nitrite: NEGATIVE
Protein, ur: NEGATIVE mg/dL
SPECIFIC GRAVITY, URINE: 1.019 (ref 1.005–1.030)
pH: 6 (ref 5.0–8.0)

## 2015-10-30 LAB — I-STAT TROPONIN, ED: Troponin i, poc: 0.05 ng/mL (ref 0.00–0.08)

## 2015-10-30 LAB — BRAIN NATRIURETIC PEPTIDE: B Natriuretic Peptide: 1750.1 pg/mL — ABNORMAL HIGH (ref 0.0–100.0)

## 2015-10-30 LAB — PATHOLOGIST SMEAR REVIEW

## 2015-10-30 MED ORDER — IPRATROPIUM-ALBUTEROL 0.5-2.5 (3) MG/3ML IN SOLN
3.0000 mL | Freq: Once | RESPIRATORY_TRACT | Status: AC
  Administered 2015-10-30: 3 mL via RESPIRATORY_TRACT
  Filled 2015-10-30: qty 3

## 2015-10-30 MED ORDER — ALBUTEROL SULFATE HFA 108 (90 BASE) MCG/ACT IN AERS
2.0000 | INHALATION_SPRAY | Freq: Once | RESPIRATORY_TRACT | Status: AC
  Administered 2015-10-30: 2 via RESPIRATORY_TRACT
  Filled 2015-10-30: qty 6.7

## 2015-10-30 NOTE — ED Provider Notes (Signed)
CSN: [id number]     Arrival date & time 10/30/15  1147 History   First MD Initiated Contact with Patient 10/30/15 1238     Chief Complaint  Patient presents with  . Dizziness  . Shortness of Breath  . Weakness     (Consider location/radiation/quality/duration/timing/severity/associated sxs/prior Treatment) HPI Bruce [name] is a 70 y.o. male history of coronary disease, CVA, TIA, CML, chronic kidney disease, presents to emergency department complaining of cough. Patient states he feels like he has been coughing more over the last week. Here he reports that he relapsed and has been smoking again. He states cough is productive. He reports intermittent shortness of breath. Denies any lower extremity swelling. States he has been compliant with medications. Denies fever, chills. Denies nausea, vomiting. Denies chest pain. States he did run out of inhaler. Pt also requesting to speak with a male doctor regarding a "male issue."   Past Medical History  Diagnosis Date  . Coronary artery disease   . Hypertension   . Hypercholesterolemia   . Stroke ([place])     No residual limb weakness.  Walks with cane at baseline.   Marland Kitchen TIA (transient ischemic attack) 05/10/2014  . Bell's palsy   . CML (chronic myelocytic leukemia) ([place])   . Leukemia ([place])   . CKD (chronic kidney disease), stage II    Past Surgical History  Procedure Laterality Date  . Back surgery    . Hip arthroplasty Right     orif  . Orif forearm fracture Right    Family History  Problem Relation Age of Onset  . Diabetes Mother   . Hypertension Mother   . Diabetes Father   . Hypertension Father   . Diabetes Brother   . Hypertension Brother   . Diabetes Sister   . Hypertension Sister   . Diabetes Brother   . Hypertension Brother   . Diabetes Sister   . Hypertension Sister    Social History  Substance Use Topics  . Smoking status: Former Smoker -- 0.50 packs/day for 50 years    Types: Cigarettes  . Smokeless tobacco:  Never Used  . Alcohol Use: 0.6 oz/week    1 Cans of beer per week     Comment: daily     Review of Systems  Constitutional: Negative for fever and chills.  Respiratory: Positive for cough, chest tightness and shortness of breath.   Cardiovascular: Negative for chest pain, palpitations and leg swelling.  Gastrointestinal: Negative for nausea, vomiting, abdominal pain, diarrhea and abdominal distention.  Genitourinary: Negative for dysuria, urgency, frequency and hematuria.  Musculoskeletal: Negative for myalgias, arthralgias, neck pain and neck stiffness.  Skin: Negative for rash.  Allergic/Immunologic: Negative for immunocompromised state.  Neurological: Negative for dizziness, weakness, light-headedness, numbness and headaches.  All other systems reviewed and are negative.     Allergies  Review of patient's allergies indicates no known allergies.  Home Medications   Prior to Admission medications   Medication Sig Start Date End Date Taking? Authorizing Provider  acetaminophen (TYLENOL) 325 MG tablet Take 2 tablets (650 mg total) by mouth every 4 (four) hours as needed for headache or mild pain. 06/06/15   [name], MD  albuterol (PROVENTIL HFA;VENTOLIN HFA) 108 (90 BASE) MCG/ACT inhaler Inhale 2 puffs into the lungs every 4 (four) hours as needed for wheezing or shortness of breath. 08/08/14   Noemi Chapel, MD  ALPRAZolam Duanne Moron) 0.25 MG tablet Take 0.25 mg by mouth 2 (two) times daily. 06/10/15  Historical Provider, MD  amiodarone (PACERONE) 200 MG tablet Take 1 tablet (200 mg total) by mouth daily. 10/15/15   [name], MD  aspirin 81 MG chewable tablet Chew 1 tablet (81 mg total) by mouth daily. 09/22/14   Hosie Poisson, MD  atorvastatin (LIPITOR) 40 MG tablet Take 40 mg by mouth daily. 08/23/15   Historical Provider, MD  bisacodyl (DULCOLAX) 5 MG EC tablet Take 2 tablets (10 mg total) by mouth daily as needed for moderate constipation. 10/15/15   [name], MD  clopidogrel  (PLAVIX) 75 MG tablet Take 1 tablet (75 mg total) by mouth daily. 03/21/14   [name], MD  dasatinib (SPRYCEL) 100 MG tablet Take 100 mg by mouth daily.    Historical Provider, MD  digoxin (LANOXIN) 0.125 MG tablet Take 1 tablet (0.125 mg total) by mouth daily. 01/29/15   Barton Dubois, MD  ENTRESTO 24-26 MG Take 1 tablet by mouth 2 (two) times daily.  08/05/15   Historical Provider, MD  furosemide (LASIX) 80 MG tablet Take 80 mg by mouth daily as needed for fluid or edema.  03/27/15   Historical Provider, MD  oxyCODONE-acetaminophen (PERCOCET/ROXICET) 5-325 MG tablet Take 2 tablets by mouth every 4 (four) hours as needed for severe pain. 06/24/15   Nona Dell, PA-C  potassium chloride SA (K-DUR,KLOR-CON) 20 MEQ tablet Take 20 mEq by mouth daily. Reported on 10/03/2015 06/10/15   Historical Provider, MD   BP 142/88 mmHg  Pulse 95  Temp(Src) 97.8 F (36.6 C) (Oral)  Resp 23  SpO2 96% Physical Exam  Constitutional: He is oriented to person, place, and time. He appears well-developed and well-nourished. No distress.  HENT:  Head: Normocephalic and atraumatic.  Eyes: Conjunctivae are normal.  Neck: Neck supple.  Cardiovascular: Normal rate, regular rhythm and normal heart sounds.   Pulmonary/Chest: Effort normal and breath sounds normal. No respiratory distress. He has no wheezes. He has no rales.  Abdominal: Soft. Bowel sounds are normal. He exhibits no distension. There is no tenderness. There is no rebound.  Musculoskeletal: He exhibits no edema.  Neurological: He is alert and oriented to person, place, and time.  Skin: Skin is warm and dry.  Nursing note and vitals reviewed.   ED Course  Procedures (including critical care time) Labs Review Labs Reviewed  CBC WITH DIFFERENTIAL/PLATELET - Abnormal; Notable for the following:    WBC 22.8 (*)    RBC 3.20 (*)    Hemoglobin 10.2 (*)    HCT 31.5 (*)    RDW 15.8 (*)    Platelets 637 (*)    Neutro Abs 17.7 (*)     Monocytes Absolute 1.6 (*)    Basophils Absolute 0.5 (*)    All other components within normal limits  COMPREHENSIVE METABOLIC PANEL - Abnormal; Notable for the following:    Glucose, Bld 116 (*)    Alkaline Phosphatase 228 (*)    Total Bilirubin 1.5 (*)    All other components within normal limits  BRAIN NATRIURETIC PEPTIDE - Abnormal; Notable for the following:    B Natriuretic Peptide 1750.1 (*)    All other components within normal limits  URINALYSIS, ROUTINE W REFLEX MICROSCOPIC (NOT AT [facility])  PATHOLOGIST SMEAR REVIEW  I-STAT TROPOININ, ED  GC/CHLAMYDIA PROBE AMP ([place]) NOT AT [facility]    Imaging Review Dg Chest 2 View  10/30/2015  CLINICAL DATA:  Dizziness, weakness, shortness of breath for 2 days EXAM: CHEST  2 VIEW COMPARISON:  10/10/2015 FINDINGS: Cardiomegaly again  noted. No acute infiltrate or pleural effusion. No pulmonary edema. Mild degenerative changes mid and lower thoracic spine. IMPRESSION: No active cardiopulmonary disease. Electronically Signed   By: Lahoma Crocker M.D.   On: 10/30/2015 13:46   I have personally reviewed and evaluated these images and lab results as part of my medical decision-making.   EKG Interpretation   Date/Time:  Thursday October 30 2015 11:55:03 EDT Ventricular Rate:  97 PR Interval:    QRS Duration: 99 QT Interval:  387 QTC Calculation: 492 R Axis:   -32 Text Interpretation:  Sinus rhythm Left axis deviation Abnormal T,  consider ischemia, lateral leads no significant change since October 15 2015  Confirmed by Regenia Skeeter MD, SCOTT [phone]) on 10/30/2015 1:26:54 PM      MDM   Final diagnoses:  Cough  Penile discharge   Pt in ed with increased cough and intermittent sob. Pt is actively coughing. Lungs are clear. Will try Duoneb to help symptomatically. Will get labs, ECG, CXR.   Dr. Regenia Skeeter to speak with pt regarding his male issue. Will need follow up with urolgoy. Pt feels better after a breathing treatment.   3:58 PM Labs showing  elevated BNP, however no sings of fluid overload on exam today. CXR clear. CBC close to baseline. Normal electrolytes. Pt stable for dc home today with inhaler, pcp follow up. Pt voiced understanding.    Filed Vitals:   10/30/15 1156 10/30/15 1406  BP: 142/88 143/92  Pulse: 95 95  Temp: 97.8 F (36.6 C)   TempSrc: Oral   Resp: 23 16  SpO2: 96% 98%     Jeannett Senior, PA-C 10/30/15 [place], MD 10/31/15 [phone]

## 2015-10-30 NOTE — ED Notes (Signed)
Pt reports dizziness and generalized weakness for a few days. He sts he is also short of breath, because "I tried to smoke few cigarettes". He reports he [facility] in [facility]. He dines pain at this time.

## 2015-10-30 NOTE — Discharge Instructions (Signed)
He is inhaler 2 puffs every 4 hours for shortness of breath and wheezing. Continue all your regular medications. Do not smoke. Follow-up with Dr.Harwani for recheck. Follow-up with urology regarding your male issues.   Chronic Bronchitis Chronic bronchitis is a lasting inflammation of the bronchial tubes, which are the tubes that carry air into your lungs. This is inflammation that occurs:   On most days of the week.   For at least three months at a time.   Over a period of two years in a row. When the bronchial tubes are inflamed, they start to produce mucus. The inflammation and buildup of mucus make it more difficult to breathe. Chronic bronchitis is usually a permanent problem and is one type of chronic obstructive pulmonary disease (COPD). People with chronic bronchitis are at greater risk for getting repeated colds, or respiratory infections. CAUSES  Chronic bronchitis most often occurs in people who have:  Long-standing, severe asthma.  A history of smoking.  Asthma and who also smoke. SIGNS AND SYMPTOMS  Chronic bronchitis may cause the following:   A cough that brings up mucus (productive cough).  Shortness of breath.  Early morning headache.  Wheezing.  Chest discomfort.   Recurring respiratory infections. DIAGNOSIS  Your health care provider may confirm the diagnosis by:  Taking your medical history.  Performing a physical exam.  Taking a chest X-ray.   Performing pulmonary function tests. TREATMENT  Treatment involves controlling symptoms with medicines, oxygen therapy, or making lifestyle changes, such as exercising and eating a healthy, well-balanced diet. Medicines could include:  Inhalers to improve air flow in and out of your lungs.  Antibiotics to treat bacterial infections, such as pneumonia, sinus infections, and acute bronchitis. As a preventative measure, your health care provider may recommend routine vaccinations for influenza and  pneumonia. This is to prevent infection and hospitalization since you may be more at risk for these types of infections.  HOME CARE INSTRUCTIONS  Take medicines only as directed by your health care provider.   If you smoke cigarettes, chew tobacco, or use electronic cigarettes, quit. If you need help quitting, ask your health care provider.  Avoid pollen, dust, animal dander, molds, smoke, and other things that cause shortness of breath or wheezing attacks.  Talk to your health care provider about possible exercise routines. Regular exercise is very important to help you feel better.  If you are prescribed oxygen use at home follow these guidelines:  Never smoke while using oxygen. Oxygen does not burn or explode, but flammable materials will burn faster in the presence of oxygen.  Keep a Data processing manager close by. Let your fire department know that you have oxygen in your home.  Warn visitors not to smoke near you when you are using oxygen. Put up "no smoking" signs in your home where you most often use the oxygen.  Regularly test your smoke detectors at home to make sure they work. If you receive care in your home from a nurse or other health care provider, he or she may also check to make sure your smoke detectors work.  Ask your health care provider whether you would benefit from a pulmonary rehabilitation program.  Do not wait to get medical care if you have any concerning symptoms. Delays could cause permanent injury and may be life threatening. SEEK MEDICAL CARE IF:  You have increased coughing or shortness of breath or both.  You have muscle aches.  You have chest pain.  Your mucus gets  thicker.  Your mucus changes from clear or white to yellow, green, gray, or bloody. SEEK IMMEDIATE MEDICAL CARE IF:  Your usual medicines do not stop your wheezing.   You have increased difficulty breathing.   You have any problems with the medicine you are taking, such as a rash,  itching, swelling, or trouble breathing. MAKE SURE YOU:   Understand these instructions.  Will watch your condition.  Will get help right away if you are not doing well or get worse.   This information is not intended to replace advice given to you by your health care provider. Make sure you discuss any questions you have with your health care provider.   Document Released: 01/14/2006 Document Revised: 04/19/2014 Document Reviewed: 05/07/2013 Elsevier Interactive Patient Education [employer].

## 2015-10-30 NOTE — ED Notes (Signed)
Urinal at the bedside. Patient will obtain urine sample as soon as able.

## 2015-10-30 NOTE — ED Notes (Signed)
Pt sts he has missed some of his appointments (cardiology and oncology) and need to talk to his cancer doctor.

## 2015-10-31 LAB — GC/CHLAMYDIA PROBE AMP (~~LOC~~) NOT AT ARMC
CHLAMYDIA, DNA PROBE: NEGATIVE
NEISSERIA GONORRHEA: NEGATIVE

## 2015-11-04 ENCOUNTER — Telehealth: Payer: Self-pay | Admitting: Hematology

## 2015-11-04 NOTE — Telephone Encounter (Signed)
Pt walked in to resched missed apt from 6/30

## 2015-11-10 ENCOUNTER — Emergency Department (HOSPITAL_COMMUNITY): Payer: Medicare Other

## 2015-11-10 ENCOUNTER — Encounter (HOSPITAL_COMMUNITY): Payer: Self-pay | Admitting: Emergency Medicine

## 2015-11-10 ENCOUNTER — Emergency Department (HOSPITAL_COMMUNITY)
Admission: EM | Admit: 2015-11-10 | Discharge: 2015-11-10 | Disposition: A | Discharge status: Home / Self Care | Payer: Medicare Other | Attending: Emergency Medicine | Admitting: Emergency Medicine

## 2015-11-10 DIAGNOSIS — Z87891 Personal history of nicotine dependence: Secondary | ICD-10-CM | POA: Insufficient documentation

## 2015-11-10 DIAGNOSIS — R0602 Shortness of breath: Secondary | ICD-10-CM

## 2015-11-10 DIAGNOSIS — N182 Chronic kidney disease, stage 2 (mild): Secondary | ICD-10-CM | POA: Diagnosis not present

## 2015-11-10 DIAGNOSIS — R109 Unspecified abdominal pain: Secondary | ICD-10-CM | POA: Diagnosis present

## 2015-11-10 DIAGNOSIS — I5043 Acute on chronic combined systolic (congestive) and diastolic (congestive) heart failure: Secondary | ICD-10-CM | POA: Insufficient documentation

## 2015-11-10 DIAGNOSIS — Z7982 Long term (current) use of aspirin: Secondary | ICD-10-CM | POA: Insufficient documentation

## 2015-11-10 DIAGNOSIS — Z79899 Other long term (current) drug therapy: Secondary | ICD-10-CM | POA: Diagnosis not present

## 2015-11-10 DIAGNOSIS — R059 Cough, unspecified: Secondary | ICD-10-CM

## 2015-11-10 DIAGNOSIS — I130 Hypertensive heart and chronic kidney disease with heart failure and stage 1 through stage 4 chronic kidney disease, or unspecified chronic kidney disease: Secondary | ICD-10-CM | POA: Diagnosis not present

## 2015-11-10 DIAGNOSIS — K219 Gastro-esophageal reflux disease without esophagitis: Secondary | ICD-10-CM | POA: Diagnosis not present

## 2015-11-10 DIAGNOSIS — Z856 Personal history of leukemia: Secondary | ICD-10-CM | POA: Insufficient documentation

## 2015-11-10 DIAGNOSIS — R05 Cough: Secondary | ICD-10-CM

## 2015-11-10 DIAGNOSIS — I2510 Atherosclerotic heart disease of native coronary artery without angina pectoris: Secondary | ICD-10-CM | POA: Diagnosis not present

## 2015-11-10 LAB — CBC
HEMATOCRIT: 32.7 % — AB (ref 39.0–52.0)
Hemoglobin: 10.6 g/dL — ABNORMAL LOW (ref 13.0–17.0)
MCH: 31.9 pg (ref 26.0–34.0)
MCHC: 32.4 g/dL (ref 30.0–36.0)
MCV: 98.5 fL (ref 78.0–100.0)
Platelets: 613 10*3/uL — ABNORMAL HIGH (ref 150–400)
RBC: 3.32 MIL/uL — ABNORMAL LOW (ref 4.22–5.81)
RDW: 14.9 % (ref 11.5–15.5)
WBC: 21.5 10*3/uL — AB (ref 4.0–10.5)

## 2015-11-10 LAB — PROTIME-INR
INR: 1.28
PROTHROMBIN TIME: 16 s — AB (ref 11.4–15.2)

## 2015-11-10 LAB — BASIC METABOLIC PANEL
ANION GAP: 9 (ref 5–15)
BUN: 19 mg/dL (ref 6–20)
CALCIUM: 9.4 mg/dL (ref 8.9–10.3)
CO2: 25 mmol/L (ref 22–32)
Chloride: 103 mmol/L (ref 101–111)
Creatinine, Ser: 1.38 mg/dL — ABNORMAL HIGH (ref 0.61–1.24)
GFR calc Af Amer: 59 mL/min — ABNORMAL LOW (ref >60)
GFR, EST NON AFRICAN AMERICAN: 51 mL/min — AB (ref >60)
Glucose, Bld: 189 mg/dL — ABNORMAL HIGH (ref 65–99)
POTASSIUM: 3.4 mmol/L — AB (ref 3.5–5.1)
SODIUM: 137 mmol/L (ref 135–145)

## 2015-11-10 LAB — I-STAT TROPONIN, ED: TROPONIN I, POC: 0.03 ng/mL (ref 0.00–0.08)

## 2015-11-10 MED ORDER — ALBUTEROL SULFATE HFA 108 (90 BASE) MCG/ACT IN AERS
1.0000 | INHALATION_SPRAY | Freq: Once | RESPIRATORY_TRACT | Status: AC
  Administered 2015-11-10: 1 via RESPIRATORY_TRACT
  Filled 2015-11-10: qty 6.7

## 2015-11-10 MED ORDER — RANITIDINE HCL 150 MG PO CAPS: 150.0000 mg | ORAL_CAPSULE | Freq: Every day | ORAL | 0 refills | Status: DC

## 2015-11-10 MED ORDER — CALCIUM CARBONATE ANTACID 600 MG PO CHEW: 600.0000 mg | CHEWABLE_TABLET | ORAL | 0 refills | Status: DC | PRN

## 2015-11-10 NOTE — Discharge Instructions (Signed)
Use albuterol inhaler as needed. Avoid tobacco use. Take omeprazole and use Maalox as needed for burning sensation from acid reflux. Avoid spicy and fatty foods. Follow up with your primary care provider for re-evaluation. Return to the ED if you experience loss of consciousness, chest pain, difficulty breathing, severe headache, vomiting, weakness.

## 2015-11-10 NOTE — ED Notes (Signed)
Pt has multiple complaints when assessed by this RN. C/o chest pain, abdominal pain, headache, leg pain. Pt denies N/V/D. Denies urinary symptoms.

## 2015-11-10 NOTE — ED Triage Notes (Signed)
Pt reports abd pain for the past few months. Hx of hiatal hernia. Last night began to have SOB and palpitations. This morning CP worsened.

## 2015-11-10 NOTE — ED Provider Notes (Signed)
[facility] DEPT Provider Note   CSN: [id number] Arrival date & time: 11/10/15  1219  First Provider Contact:  None       History   Chief Complaint Chief Complaint  Patient presents with  . Abdominal Pain  . Chest Pain    HPI BENJAMIM MATHISON is a 70 y.o. male with a pmhx of CK D, CML, CAD, HTN, TIA who presents to the ED today with multiple complaints. Patient is a very poor historian. Patient states that this morning around 10 AM he was sitting outside and when he stood up he felt very short of breath and dizzy. Patient states that the episode did not last long. He became concerned and brought to the emergency department for further evaluation. Patient states that he often feels for a short of breath, similar to the episode he feels today. Patient has a chronic cough which he uses albuterol inhaler for. Patient also states that he had an episode of substernal chest pain he describes as a burning sensation that occurred 2 days ago after he ate a large meal. Patient states this felt like acid reflux. Patient states that he had another episode of similar chest pain yesterday. Patient is currently chest pain-free. He states that because he felt dizzy he decided to come to the ear. He currently does not have any complaints and states that he feels much better.  Patient also states that he "feels like there is an egg in my stomach." Patient states that he thinks this is a hernia and has been present for many years. He states that occasionally causes some pain but has not recently. He denies any nausea, vomiting, dysuria.  HPI  Past Medical History:  Diagnosis Date  . Bell's palsy   . CKD (chronic kidney disease), stage II   . CML (chronic myelocytic leukemia) ([place])   . Coronary artery disease   . Hypercholesterolemia   . Hypertension   . Leukemia ([place])   . Stroke ([place])    No residual limb weakness.  Walks with cane at baseline.   Marland Kitchen TIA (transient ischemic attack) 05/10/2014     Patient Active Problem List   Diagnosis Date Noted  . Elevated lactic acid level 10/11/2015  . Nausea & vomiting 10/10/2015  . CHF exacerbation ([place]) 10/10/2015  . CKD (chronic kidney disease), stage II   . Acute on chronic systolic (congestive) heart failure ([place]) 06/03/2015  . Dizziness 01/25/2015  . Acute on chronic combined systolic and diastolic CHF (congestive heart failure) ([place]) 01/25/2015  . Essential hypertension 01/25/2015  . Compliance poor 01/25/2015  . Coronary artery disease   . Cardiomyopathy, ischemic 09/24/2014  . H/O: stroke with residual effects 09/13/2014  . Transaminitis 09/13/2014  . Acute kidney injury ([place]) 09/13/2014  . Abdominal pain 09/13/2014  . Elevated troponin 09/13/2014  . TIA (transient ischemic attack) 05/10/2014  . CML (chronic myelocytic leukemia) ([place]) 03/27/2014  . Leukocytosis 03/18/2014  . Chest pain 03/05/2014    Past Surgical History:  Procedure Laterality Date  . BACK SURGERY    . HIP ARTHROPLASTY Right    orif  . ORIF FOREARM FRACTURE Right        Home Medications    Prior to Admission medications   Medication Sig Start Date End Date Taking? Authorizing Provider  amiodarone (PACERONE) 200 MG tablet Take 1 tablet (200 mg total) by mouth daily. 10/15/15  Yes [name], MD  aspirin 81 MG chewable tablet Chew 1 tablet (81 mg total) by mouth  daily. 09/22/14  Yes Hosie Poisson, MD  atorvastatin (LIPITOR) 40 MG tablet Take 40 mg by mouth daily. 08/23/15  Yes Historical Provider, MD  carvedilol (COREG) 3.125 MG tablet Take 3.125 mg by mouth 2 (two) times daily. 10/16/15  Yes Historical Provider, MD  clopidogrel (PLAVIX) 75 MG tablet Take 1 tablet (75 mg total) by mouth daily. 03/21/14  Yes [name], MD  dasatinib (SPRYCEL) 100 MG tablet Take 100 mg by mouth daily.   Yes Historical Provider, MD  ENTRESTO 24-26 MG Take 1 tablet by mouth 2 (two) times daily.  08/05/15  Yes Historical Provider, MD  furosemide (LASIX) 80 MG tablet  Take 80 mg by mouth daily as needed for fluid or edema.  03/27/15  Yes Historical Provider, MD  potassium chloride SA (K-DUR,KLOR-CON) 20 MEQ tablet Take 20 mEq by mouth daily. Reported on 10/03/2015 06/10/15  Yes Historical Provider, MD  acetaminophen (TYLENOL) 325 MG tablet Take 2 tablets (650 mg total) by mouth every 4 (four) hours as needed for headache or mild pain. 06/06/15   [name], MD  albuterol (PROVENTIL HFA;VENTOLIN HFA) 108 (90 BASE) MCG/ACT inhaler Inhale 2 puffs into the lungs every 4 (four) hours as needed for wheezing or shortness of breath. 08/08/14   Noemi Chapel, MD  ALPRAZolam Duanne Moron) 0.25 MG tablet Take 0.25 mg by mouth 2 (two) times daily. 06/10/15   Historical Provider, MD  benzonatate (TESSALON) 100 MG capsule take 2 capsules by mouth twice a day if needed for cough 10/09/15   Historical Provider, MD  bisacodyl (DULCOLAX) 5 MG EC tablet Take 2 tablets (10 mg total) by mouth daily as needed for moderate constipation. 10/15/15   [name], MD  digoxin (LANOXIN) 0.125 MG tablet Take 1 tablet (0.125 mg total) by mouth daily. Patient not taking: Reported on 11/10/2015 01/29/15   Barton Dubois, MD  oxyCODONE-acetaminophen (PERCOCET/ROXICET) 5-325 MG tablet Take 2 tablets by mouth every 4 (four) hours as needed for severe pain. Patient not taking: Reported on 11/10/2015 06/24/15   Nona Dell, PA-C    Family History Family History  Problem Relation Age of Onset  . Diabetes Mother   . Hypertension Mother   . Diabetes Father   . Hypertension Father   . Diabetes Brother   . Hypertension Brother   . Diabetes Sister   . Hypertension Sister   . Diabetes Brother   . Hypertension Brother   . Diabetes Sister   . Hypertension Sister     Social History Social History  Substance Use Topics  . Smoking status: Former Smoker    Packs/day: 0.50    Years: 50.00    Types: Cigarettes  . Smokeless tobacco: Never Used  . Alcohol use 0.6 oz/week    1 Cans of beer per  week     Comment: daily      Allergies   Review of patient's allergies indicates no known allergies.   Review of Systems Review of Systems  All other systems reviewed and are negative.    Physical Exam Updated Vital Signs BP 128/90 (BP Location: Right Arm)   Pulse 73   Temp 98.3 F (36.8 C) (Oral)   Resp 25   SpO2 98%   Physical Exam  Constitutional: He is oriented to person, place, and time. He appears well-developed. No distress.  HENT:  Head: Normocephalic and atraumatic.  Mouth/Throat: No oropharyngeal exudate.  Eyes: Conjunctivae and EOM are normal. Pupils are equal, round, and reactive to light. Right eye exhibits no  discharge. Left eye exhibits no discharge. No scleral icterus.  Cardiovascular: Normal rate, regular rhythm, normal heart sounds and intact distal pulses.  Exam reveals no gallop and no friction rub.   No murmur heard. Pulmonary/Chest: Effort normal. No respiratory distress. He has wheezes. He has no rales. He exhibits no tenderness.  Abdominal: Soft. He exhibits no distension. There is no tenderness. There is no guarding.  Musculoskeletal: Normal range of motion. He exhibits no edema.  Neurological: He is alert and oriented to person, place, and time.  Strength 5/5 throughout. No sensory deficits.    Skin: Skin is warm and dry. No rash noted. He is not diaphoretic. No erythema. No pallor.  Psychiatric: He has a normal mood and affect. His behavior is normal.  Nursing note and vitals reviewed.    ED Treatments / Results  Labs (all labs ordered are listed, but only abnormal results are displayed) Labs Reviewed  BASIC METABOLIC PANEL - Abnormal; Notable for the following:       Result Value   Potassium 3.4 (*)    Glucose, Bld 189 (*)    Creatinine, Ser 1.38 (*)    GFR calc non Af Amer 51 (*)    GFR calc Af Amer 59 (*)    All other components within normal limits  CBC - Abnormal; Notable for the following:    WBC 21.5 (*)    RBC 3.32 (*)     Hemoglobin 10.6 (*)    HCT 32.7 (*)    Platelets 613 (*)    All other components within normal limits  PROTIME-INR - Abnormal; Notable for the following:    Prothrombin Time 16.0 (*)    All other components within normal limits  I-STAT TROPOININ, ED    EKG  EKG Interpretation  Date/Time:  Monday November 10 2015 12:50:15 EDT Ventricular Rate:  64 PR Interval:    QRS Duration: 108 QT Interval:  509 QTC Calculation: 526 R Axis:   -26 Text Interpretation:  Sinus rhythm LVH with secondary repolarization abnormality Prolonged QT interval Since last tracing QT has lengthened Confirmed by Eulis Foster  MD, ELLIOTT [phone]) on 11/10/2015 2:40:31 PM       Radiology Dg Chest 2 View  Result Date: 11/10/2015 CLINICAL DATA:  Shortness of breath and cough EXAM: CHEST  2 VIEW COMPARISON:  October 30, 2015 FINDINGS: There is no edema or consolidation. Heart is slightly enlarged with pulmonary vascularity within normal limits. No adenopathy. There is degenerative change in thoracic spine. IMPRESSION: Mild cardiac enlargement.  No edema or consolidation Electronically Signed   By: Lowella Grip III M.D.   On: 11/10/2015 13:26    Procedures Procedures (including critical care time)  Medications Ordered in ED Medications - No data to display   Initial Impression / Assessment and Plan / ED Course  I have reviewed the triage vital signs and the nursing notes.  Pertinent labs & imaging results that were available during my care of the patient were reviewed by me and considered in my medical decision making (see chart for details).  Clinical Course    70 y.o M with a pmhx of COPD, CML presents to the ED c/o cough, intermittent SOB and episode of dizziness this morning. Pt has chronic cough for which he uses inhaler for. Pt also states that he had an episode of burning sensation in the center of his chest yesterday. Pt has long hx of GERD. Pt is currently symptom free and appears well in the ED. All  VSS.  EKG unchanged from previous. Initial troponin wnl. Leukocytosis presents, this is baseline due to CML. CXR unremarkable. Pt given nebulizer in ED with significant symptomatic improvement. Pt does not appear to be fluid overloaded. Pt states that he feels much improved and would like to go home. Pt was recently see in the ED last week with similar symptoms. Will d/c pt home with PCP follow up this week and rx for inhaler, PPI and Maalox.  Final Clinical Impressions(s) / ED Diagnoses   Final diagnoses:  Cough  SOB (shortness of breath)  Gastroesophageal reflux disease, esophagitis presence not specified    New Prescriptions New Prescriptions   No medications on file     Carlos Levering, PA-C 11/11/15 [place], MD 11/11/15 [place], MD 11/25/15 1210

## 2015-11-22 ENCOUNTER — Emergency Department (HOSPITAL_COMMUNITY): Payer: Medicare Other

## 2015-11-22 ENCOUNTER — Encounter (HOSPITAL_COMMUNITY): Payer: Self-pay | Admitting: *Deleted

## 2015-11-22 ENCOUNTER — Emergency Department (HOSPITAL_COMMUNITY)
Admission: EM | Admit: 2015-11-22 | Discharge: 2015-11-22 | Disposition: A | Discharge status: Home / Self Care | Payer: Medicare Other | Attending: Emergency Medicine | Admitting: Emergency Medicine

## 2015-11-22 DIAGNOSIS — I5043 Acute on chronic combined systolic (congestive) and diastolic (congestive) heart failure: Secondary | ICD-10-CM | POA: Insufficient documentation

## 2015-11-22 DIAGNOSIS — R0602 Shortness of breath: Secondary | ICD-10-CM | POA: Diagnosis not present

## 2015-11-22 DIAGNOSIS — I2510 Atherosclerotic heart disease of native coronary artery without angina pectoris: Secondary | ICD-10-CM | POA: Diagnosis not present

## 2015-11-22 DIAGNOSIS — R059 Cough, unspecified: Secondary | ICD-10-CM

## 2015-11-22 DIAGNOSIS — Z8673 Personal history of transient ischemic attack (TIA), and cerebral infarction without residual deficits: Secondary | ICD-10-CM | POA: Insufficient documentation

## 2015-11-22 DIAGNOSIS — R05 Cough: Secondary | ICD-10-CM | POA: Insufficient documentation

## 2015-11-22 DIAGNOSIS — I130 Hypertensive heart and chronic kidney disease with heart failure and stage 1 through stage 4 chronic kidney disease, or unspecified chronic kidney disease: Secondary | ICD-10-CM | POA: Insufficient documentation

## 2015-11-22 DIAGNOSIS — Z87891 Personal history of nicotine dependence: Secondary | ICD-10-CM | POA: Diagnosis not present

## 2015-11-22 DIAGNOSIS — N182 Chronic kidney disease, stage 2 (mild): Secondary | ICD-10-CM | POA: Insufficient documentation

## 2015-11-22 DIAGNOSIS — Z79899 Other long term (current) drug therapy: Secondary | ICD-10-CM | POA: Diagnosis not present

## 2015-11-22 DIAGNOSIS — Z7982 Long term (current) use of aspirin: Secondary | ICD-10-CM | POA: Insufficient documentation

## 2015-11-22 LAB — CBC
HCT: 32.9 % — ABNORMAL LOW (ref 39.0–52.0)
Hemoglobin: 10.5 g/dL — ABNORMAL LOW (ref 13.0–17.0)
MCH: 31.9 pg (ref 26.0–34.0)
MCHC: 31.9 g/dL (ref 30.0–36.0)
MCV: 100 fL (ref 78.0–100.0)
PLATELETS: 572 10*3/uL — AB (ref 150–400)
RBC: 3.29 MIL/uL — ABNORMAL LOW (ref 4.22–5.81)
RDW: 15.5 % (ref 11.5–15.5)
WBC: 32.2 10*3/uL — AB (ref 4.0–10.5)

## 2015-11-22 LAB — I-STAT TROPONIN, ED: TROPONIN I, POC: 0.02 ng/mL (ref 0.00–0.08)

## 2015-11-22 LAB — BASIC METABOLIC PANEL
Anion gap: 10 (ref 5–15)
BUN: 15 mg/dL (ref 6–20)
CALCIUM: 9.4 mg/dL (ref 8.9–10.3)
CO2: 24 mmol/L (ref 22–32)
CREATININE: 1.28 mg/dL — AB (ref 0.61–1.24)
Chloride: 104 mmol/L (ref 101–111)
GFR calc Af Amer: >60 mL/min (ref >60)
GFR, EST NON AFRICAN AMERICAN: 55 mL/min — AB (ref >60)
Glucose, Bld: 114 mg/dL — ABNORMAL HIGH (ref 65–99)
Potassium: 3.9 mmol/L (ref 3.5–5.1)
SODIUM: 138 mmol/L (ref 135–145)

## 2015-11-22 LAB — BRAIN NATRIURETIC PEPTIDE: B NATRIURETIC PEPTIDE 5: 1819.2 pg/mL — AB (ref 0.0–100.0)

## 2015-11-22 MED ORDER — FUROSEMIDE 20 MG PO TABS
80.0000 mg | ORAL_TABLET | Freq: Once | ORAL | Status: AC
  Administered 2015-11-22: 80 mg via ORAL
  Filled 2015-11-22: qty 4

## 2015-11-22 MED ORDER — IPRATROPIUM-ALBUTEROL 0.5-2.5 (3) MG/3ML IN SOLN
3.0000 mL | Freq: Once | RESPIRATORY_TRACT | Status: AC
  Administered 2015-11-22: 3 mL via RESPIRATORY_TRACT
  Filled 2015-11-22: qty 3

## 2015-11-22 NOTE — ED Provider Notes (Signed)
[facility] DEPT Provider Note   CSN: [id number] Arrival date & time: 11/22/15  1650  First Provider Contact:  None       History   Chief Complaint Chief Complaint  Patient presents with  . Shortness of Breath  . Leg Swelling  . Cough    HPI Bruce [name] is a 70 y.o. male.  The history is provided by the patient. No language interpreter was used.  Cough  This is a chronic problem. Episode onset: months. The problem occurs constantly. The problem has been gradually worsening. The cough is productive of sputum (occasionally white, otherwise clear). There has been no fever. Associated symptoms include chest pain (mild with cough), sweats and shortness of breath. Pertinent negatives include no chills, no headaches, no rhinorrhea, no myalgias and no wheezing. He has tried nothing for the symptoms. He is a smoker. His past medical history does not include pneumonia. COPD: no formal diagnosis.    Past Medical History:  Diagnosis Date  . Bell's palsy   . CKD (chronic kidney disease), stage II   . CML (chronic myelocytic leukemia) ([place])   . Coronary artery disease   . Hypercholesterolemia   . Hypertension   . Leukemia ([place])   . Stroke ([place])    No residual limb weakness.  Walks with cane at baseline.   Marland Kitchen TIA (transient ischemic attack) 05/10/2014    Patient Active Problem List   Diagnosis Date Noted  . Elevated lactic acid level 10/11/2015  . Nausea & vomiting 10/10/2015  . CHF exacerbation ([place]) 10/10/2015  . CKD (chronic kidney disease), stage II   . Acute on chronic systolic (congestive) heart failure ([place]) 06/03/2015  . Dizziness 01/25/2015  . Acute on chronic combined systolic and diastolic CHF (congestive heart failure) ([place]) 01/25/2015  . Essential hypertension 01/25/2015  . Compliance poor 01/25/2015  . Coronary artery disease   . Cardiomyopathy, ischemic 09/24/2014  . H/O: stroke with residual effects 09/13/2014  . Transaminitis 09/13/2014  . Acute kidney  injury ([place]) 09/13/2014  . Abdominal pain 09/13/2014  . Elevated troponin 09/13/2014  . TIA (transient ischemic attack) 05/10/2014  . CML (chronic myelocytic leukemia) ([place]) 03/27/2014  . Leukocytosis 03/18/2014  . Chest pain 03/05/2014    Past Surgical History:  Procedure Laterality Date  . BACK SURGERY    . HIP ARTHROPLASTY Right    orif  . ORIF FOREARM FRACTURE Right        Home Medications    Prior to Admission medications   Medication Sig Start Date End Date Taking? Authorizing Provider  acetaminophen (TYLENOL) 325 MG tablet Take 2 tablets (650 mg total) by mouth every 4 (four) hours as needed for headache or mild pain. 06/06/15   [name], MD  albuterol (PROVENTIL HFA;VENTOLIN HFA) 108 (90 BASE) MCG/ACT inhaler Inhale 2 puffs into the lungs every 4 (four) hours as needed for wheezing or shortness of breath. 08/08/14   Noemi Chapel, MD  ALPRAZolam Duanne Moron) 0.25 MG tablet Take 0.25 mg by mouth 2 (two) times daily. 06/10/15   Historical Provider, MD  amiodarone (PACERONE) 200 MG tablet Take 1 tablet (200 mg total) by mouth daily. 10/15/15   [name], MD  aspirin 81 MG chewable tablet Chew 1 tablet (81 mg total) by mouth daily. 09/22/14   Hosie Poisson, MD  atorvastatin (LIPITOR) 40 MG tablet Take 40 mg by mouth daily. 08/23/15   Historical Provider, MD  benzonatate (TESSALON) 100 MG capsule take 2 capsules by mouth twice a day if  needed for cough 10/09/15   Historical Provider, MD  bisacodyl (DULCOLAX) 5 MG EC tablet Take 2 tablets (10 mg total) by mouth daily as needed for moderate constipation. 10/15/15   [name], MD  Calcium Carbonate Antacid (MAALOX) 600 MG chewable tablet Chew 1 tablet (600 mg total) by mouth as needed for heartburn. 11/10/15   Samantha Tripp Dowless, PA-C  carvedilol (COREG) 3.125 MG tablet Take 3.125 mg by mouth 2 (two) times daily. 10/16/15   Historical Provider, MD  clopidogrel (PLAVIX) 75 MG tablet Take 1 tablet (75 mg total) by mouth daily. 03/21/14    [name], MD  dasatinib (SPRYCEL) 100 MG tablet Take 100 mg by mouth daily.    Historical Provider, MD  digoxin (LANOXIN) 0.125 MG tablet Take 1 tablet (0.125 mg total) by mouth daily. Patient not taking: Reported on 11/10/2015 01/29/15   Barton Dubois, MD  ENTRESTO 24-26 MG Take 1 tablet by mouth 2 (two) times daily.  08/05/15   Historical Provider, MD  furosemide (LASIX) 80 MG tablet Take 80 mg by mouth daily as needed for fluid or edema.  03/27/15   Historical Provider, MD  oxyCODONE-acetaminophen (PERCOCET/ROXICET) 5-325 MG tablet Take 2 tablets by mouth every 4 (four) hours as needed for severe pain. Patient not taking: Reported on 11/10/2015 06/24/15   Nona Dell, PA-C  potassium chloride SA (K-DUR,KLOR-CON) 20 MEQ tablet Take 20 mEq by mouth daily. Reported on 10/03/2015 06/10/15   Historical Provider, MD  ranitidine (ZANTAC) 150 MG capsule Take 1 capsule (150 mg total) by mouth daily. 11/10/15   Samantha Tripp Dowless, PA-C    Family History Family History  Problem Relation Age of Onset  . Diabetes Mother   . Hypertension Mother   . Diabetes Father   . Hypertension Father   . Diabetes Brother   . Hypertension Brother   . Diabetes Sister   . Hypertension Sister   . Diabetes Brother   . Hypertension Brother   . Diabetes Sister   . Hypertension Sister     Social History Social History  Substance Use Topics  . Smoking status: Former Smoker    Packs/day: 0.50    Years: 50.00    Types: Cigarettes  . Smokeless tobacco: Never Used  . Alcohol use 0.6 oz/week    1 Cans of beer per week     Comment: daily      Allergies   Review of patient's allergies indicates no known allergies.   Review of Systems Review of Systems  Constitutional: Negative for chills.  HENT: Negative for congestion and rhinorrhea.   Eyes: Negative.   Respiratory: Positive for cough and shortness of breath. Negative for wheezing.   Cardiovascular: Positive for chest pain (mild with  cough).  Gastrointestinal: Negative for abdominal pain and vomiting.  Genitourinary: Negative.   Musculoskeletal: Negative for myalgias.  Skin: Negative for rash.  Neurological: Negative for headaches.  Psychiatric/Behavioral: Negative.      Physical Exam Updated Vital Signs BP 130/76   Pulse 85   Temp 97.9 F (36.6 C) (Oral)   Resp 22   Ht 5\' 8"  (1.727 m)   Wt 76.1 kg   SpO2 96%   BMI 25.50 kg/m   Physical Exam  Constitutional: He is oriented to person, place, and time. He appears well-developed and well-nourished. No distress.  HENT:  Head: Normocephalic and atraumatic.  Eyes: Conjunctivae are normal. Right eye exhibits no discharge. Left eye exhibits no discharge.  Neck: Normal range of motion. Neck  supple. No tracheal deviation present.  Cardiovascular: Normal rate, regular rhythm, normal heart sounds and intact distal pulses.   No murmur heard. Pulmonary/Chest: Effort normal and breath sounds normal. No stridor. No respiratory distress. He has no wheezes. He has no rales.  Abdominal: Soft. He exhibits no distension. There is no tenderness. There is no rebound and no guarding.  Musculoskeletal: He exhibits edema (2+ bilat). He exhibits no tenderness.  Neurological: He is alert and oriented to person, place, and time. He is not disoriented. GCS eye subscore is 4. GCS verbal subscore is 5. GCS motor subscore is 6.  Skin: Skin is warm and dry. Capillary refill takes less than 2 seconds. He is not diaphoretic.  Nursing note and vitals reviewed.    ED Treatments / Results  Labs (all labs ordered are listed, but only abnormal results are displayed) Labs Reviewed  BASIC METABOLIC PANEL - Abnormal; Notable for the following:       Result Value   Glucose, Bld 114 (*)    Creatinine, Ser 1.28 (*)    GFR calc non Af Amer 55 (*)    All other components within normal limits  CBC - Abnormal; Notable for the following:    WBC 32.2 (*)    RBC 3.29 (*)    Hemoglobin 10.5 (*)      HCT 32.9 (*)    Platelets 572 (*)    All other components within normal limits  BRAIN NATRIURETIC PEPTIDE - Abnormal; Notable for the following:    B Natriuretic Peptide 1,819.2 (*)    All other components within normal limits  I-STAT TROPOININ, ED    EKG  EKG Interpretation  Date/Time:  Saturday November 22 2015 16:59:07 EDT Ventricular Rate:  85 PR Interval:  168 QRS Duration: 96 QT Interval:  414 QTC Calculation: 492 R Axis:   -46 Text Interpretation:  Normal sinus rhythm Left anterior fascicular block Nonspecific T wave abnormality Prolonged QT Confirmed by Ashok Cordia  MD, Lennette Bihari ([id number]) on 11/22/2015 6:46:54 PM       Radiology Dg Chest 2 View  Result Date: 11/22/2015 CLINICAL DATA:  70 year old male with productive cough and shortness of breath for 2 days. Initial encounter. Smoker. EXAM: CHEST  2 VIEW COMPARISON:  11/10/2015 and earlier. FINDINGS: Stable lung volumes. Stable cardiomegaly and mediastinal contours. No pneumothorax or pulmonary edema. No pleural effusion. Incidental left nipple shadow re- identified. Mild increased interstitial markings diffusely appears stable. No acute osseous abnormality identified. Visualized tracheal air column is within normal limits. IMPRESSION: No acute cardiopulmonary abnormality. Stable cardiomegaly and mild diffuse increased interstitial markings. Electronically Signed   By: Genevie Ann M.D.   On: 11/22/2015 18:04    Procedures Procedures (including critical care time)  Medications Ordered in ED Medications  ipratropium-albuterol (DUONEB) 0.5-2.5 (3) MG/3ML nebulizer solution 3 mL (3 mLs Nebulization Given 11/22/15 1848)  furosemide (LASIX) tablet 80 mg (80 mg Oral Given 11/22/15 2116)     Initial Impression / Assessment and Plan / ED Course  I have reviewed the triage vital signs and the nursing notes.  Pertinent labs & imaging results that were available during my care of the patient were reviewed by me and considered in my medical  decision making (see chart for details).  Clinical Course   70 year old male with CHF, last EF of 2025% earlier this year, and CML who presents emergency department because of bilateral LE swelling. Has shortness of breath and increased cough Productive of mucus..  No formal diagnosis of COPD  but has a history of smoking.  Patient states he has not tried taking his home inhaler for the symptoms.  Has had recurrent episodes of night sweats occasionally for weeks but no recent chills or fevers.  Afebrile in the emergency department with stable vital signs.  No recent sick contacts. Chest x-ray is unremarkable.  Patient does have increased white count which has been elevated in the past due to his CML, as a result it is not a good surrogate for infectious status.  Possible COPD exacerbation vs exacerbation due to non compliance with medications.  Do not feel that antibiotics are required at this time based on symptoms. Doubt significant CHF component at this time since CXR is WNL. Lungs clear to auscultation. Discrepency between how patient is supposed to take Lasix per EMR and how patient is taking it. Thus gave patient an additional dose in the ED.  Received 1 DuoNeb in the ED as well. Instructed to go to his appointment this week with cardiology to further evaluate diuretic regimen. Patient had somewhat improve cough on discharge. Instructed to take inhaler for coughing as well as SOB. Patient able to ambulate without new difficulty per him--uses cane at baseline. Results discussed with patient and he agrees and understands plan. Patient and VS stable at discharge. No new O2 requirement noticed this visit.  Final Clinical Impressions(s) / ED Diagnoses   Final diagnoses:  Cough    New Prescriptions Discharge Medication List as of 11/22/2015  9:43 PM       Darlina Rumpf, MD 11/24/15 0110    Lajean Saver, MD 11/27/15 [phone]

## 2015-11-24 ENCOUNTER — Ambulatory Visit: Payer: Medicare Other | Admitting: Hematology

## 2015-11-24 ENCOUNTER — Other Ambulatory Visit: Payer: Medicare Other

## 2015-11-26 ENCOUNTER — Ambulatory Visit (HOSPITAL_COMMUNITY): Payer: Medicare Other

## 2015-11-26 ENCOUNTER — Encounter (HOSPITAL_COMMUNITY): Payer: Self-pay | Admitting: *Deleted

## 2015-11-26 ENCOUNTER — Emergency Department (HOSPITAL_COMMUNITY)
Admission: EM | Admit: 2015-11-26 | Discharge: 2015-11-27 | Disposition: A | Discharge status: Home / Self Care | Payer: Medicare Other | Attending: Emergency Medicine | Admitting: Emergency Medicine

## 2015-11-26 DIAGNOSIS — Z8673 Personal history of transient ischemic attack (TIA), and cerebral infarction without residual deficits: Secondary | ICD-10-CM | POA: Insufficient documentation

## 2015-11-26 DIAGNOSIS — I129 Hypertensive chronic kidney disease with stage 1 through stage 4 chronic kidney disease, or unspecified chronic kidney disease: Secondary | ICD-10-CM | POA: Diagnosis not present

## 2015-11-26 DIAGNOSIS — R600 Localized edema: Secondary | ICD-10-CM | POA: Diagnosis not present

## 2015-11-26 DIAGNOSIS — Z87891 Personal history of nicotine dependence: Secondary | ICD-10-CM | POA: Diagnosis not present

## 2015-11-26 DIAGNOSIS — R0602 Shortness of breath: Secondary | ICD-10-CM | POA: Insufficient documentation

## 2015-11-26 DIAGNOSIS — G8929 Other chronic pain: Secondary | ICD-10-CM | POA: Diagnosis not present

## 2015-11-26 DIAGNOSIS — I2510 Atherosclerotic heart disease of native coronary artery without angina pectoris: Secondary | ICD-10-CM | POA: Diagnosis not present

## 2015-11-26 DIAGNOSIS — R109 Unspecified abdominal pain: Secondary | ICD-10-CM | POA: Diagnosis not present

## 2015-11-26 DIAGNOSIS — N182 Chronic kidney disease, stage 2 (mild): Secondary | ICD-10-CM | POA: Diagnosis not present

## 2015-11-26 DIAGNOSIS — Z7982 Long term (current) use of aspirin: Secondary | ICD-10-CM | POA: Diagnosis not present

## 2015-11-26 DIAGNOSIS — R079 Chest pain, unspecified: Secondary | ICD-10-CM | POA: Insufficient documentation

## 2015-11-26 DIAGNOSIS — Z856 Personal history of leukemia: Secondary | ICD-10-CM | POA: Diagnosis not present

## 2015-11-26 LAB — CBC
HCT: 34.7 % — ABNORMAL LOW (ref 39.0–52.0)
Hemoglobin: 11 g/dL — ABNORMAL LOW (ref 13.0–17.0)
MCH: 32 pg (ref 26.0–34.0)
MCHC: 31.7 g/dL (ref 30.0–36.0)
MCV: 100.9 fL — ABNORMAL HIGH (ref 78.0–100.0)
Platelets: 650 10*3/uL — ABNORMAL HIGH (ref 150–400)
RBC: 3.44 MIL/uL — ABNORMAL LOW (ref 4.22–5.81)
RDW: 16.2 % — ABNORMAL HIGH (ref 11.5–15.5)
WBC: 24.3 10*3/uL — ABNORMAL HIGH (ref 4.0–10.5)

## 2015-11-26 LAB — BASIC METABOLIC PANEL
Anion gap: 9 (ref 5–15)
BUN: 15 mg/dL (ref 6–20)
CHLORIDE: 107 mmol/L (ref 101–111)
CO2: 22 mmol/L (ref 22–32)
Calcium: 9.7 mg/dL (ref 8.9–10.3)
Creatinine, Ser: 1.31 mg/dL — ABNORMAL HIGH (ref 0.61–1.24)
GFR calc non Af Amer: 54 mL/min — ABNORMAL LOW (ref >60)
Glucose, Bld: 125 mg/dL — ABNORMAL HIGH (ref 65–99)
POTASSIUM: 4.4 mmol/L (ref 3.5–5.1)
SODIUM: 138 mmol/L (ref 135–145)

## 2015-11-26 LAB — I-STAT TROPONIN, ED: Troponin i, poc: 0.02 ng/mL (ref 0.00–0.08)

## 2015-11-26 NOTE — ED Notes (Signed)
Patient aggressive in triage towards staff regarding lab work being done. Procedure explained to patient. EKG is WNL and has been completed.

## 2015-11-26 NOTE — ED Triage Notes (Signed)
Patient present today with chronic issues to include abdominal pain, cough, mild chest pain, and sob. Patient with history that includes copd. Patient states he has been taking his lasix and his leg swelling has decreased. Patient present today with no new symptoms.

## 2015-11-27 ENCOUNTER — Emergency Department (HOSPITAL_COMMUNITY): Payer: Medicare Other

## 2015-11-27 LAB — I-STAT TROPONIN, ED: TROPONIN I, POC: 0.03 ng/mL (ref 0.00–0.08)

## 2015-11-27 LAB — BRAIN NATRIURETIC PEPTIDE: B NATRIURETIC PEPTIDE 5: 2487.4 pg/mL — AB (ref 0.0–100.0)

## 2015-11-27 NOTE — ED Provider Notes (Signed)
By signing my name below, I, Dora Sims, attest that this documentation has been prepared under the direction and in the presence of physician practitioner, Delice Bison Edell Mesenbrink, DO. Electronically Signed: Dora Sims, Scribe. 11/27/2015. 12:20 AM.  TIME SEEN: 12:20 AM  CHIEF COMPLAINT: Shortness of Breath  HPI: HPI Comments: Bruce [name] is a 70 y.o. male with PMHx of CAD, HTN, and HLD who presents to the Emergency Department complaining of exacerbation of his chronic SOB beginning several hours ago. Pt notes chest pain, abdominal pain, and a cough which are also chronic issues. Pt states he feels like there is a "knot" in his stomach and describes his chest pain as a burning sensation. Both have resolved. He notes acute dizziness beginning yesterday that he is unable to describe, but states it is "not that bad" currently. He notes his dizziness was more significant several hours ago and he felt like he was going to pass out, so he came to the ER. Pt denies any other complaints or symptoms. Patient is well known to our emergency department and has been here 18 times in the past 6 months.  ROS: See HPI Constitutional: no fever  Eyes: no drainage  ENT: no runny nose   Cardiovascular:  chest pain  Resp: SOB  GI: no vomiting GU: no dysuria Integumentary: no rash  Allergy: no hives  Musculoskeletal: no leg swelling  Neurological: no slurred speech ROS otherwise negative  PAST MEDICAL HISTORY/PAST SURGICAL HISTORY:  Past Medical History:  Diagnosis Date  . Bell's palsy   . CKD (chronic kidney disease), stage II   . CML (chronic myelocytic leukemia) ([place])   . Coronary artery disease   . Hypercholesterolemia   . Hypertension   . Leukemia ([place])   . Stroke ([place])    No residual limb weakness.  Walks with cane at baseline.   Marland Kitchen TIA (transient ischemic attack) 05/10/2014    MEDICATIONS:  Prior to Admission medications   Medication Sig Start Date End Date Taking? Authorizing Provider   acetaminophen (TYLENOL) 325 MG tablet Take 2 tablets (650 mg total) by mouth every 4 (four) hours as needed for headache or mild pain. 06/06/15   [name], MD  albuterol (PROVENTIL HFA;VENTOLIN HFA) 108 (90 BASE) MCG/ACT inhaler Inhale 2 puffs into the lungs every 4 (four) hours as needed for wheezing or shortness of breath. 08/08/14   Noemi Chapel, MD  amiodarone (PACERONE) 200 MG tablet Take 1 tablet (200 mg total) by mouth daily. 10/15/15   [name], MD  aspirin 81 MG chewable tablet Chew 1 tablet (81 mg total) by mouth daily. 09/22/14   Hosie Poisson, MD  atorvastatin (LIPITOR) 40 MG tablet Take 40 mg by mouth daily. 08/23/15   Historical Provider, MD  bisacodyl (DULCOLAX) 5 MG EC tablet Take 2 tablets (10 mg total) by mouth daily as needed for moderate constipation. 10/15/15   [name], MD  Calcium Carbonate Antacid (MAALOX) 600 MG chewable tablet Chew 1 tablet (600 mg total) by mouth as needed for heartburn. 11/10/15   Samantha Tripp Dowless, PA-C  carvedilol (COREG) 3.125 MG tablet Take 3.125 mg by mouth 2 (two) times daily. 10/16/15   Historical Provider, MD  clopidogrel (PLAVIX) 75 MG tablet Take 1 tablet (75 mg total) by mouth daily. 03/21/14   [name], MD  digoxin (LANOXIN) 0.125 MG tablet Take 1 tablet (0.125 mg total) by mouth daily. 01/29/15   Barton Dubois, MD  ENTRESTO 24-26 MG Take 1 tablet by mouth 2 (two)  times daily.  08/05/15   Historical Provider, MD  furosemide (LASIX) 80 MG tablet Take 80 mg by mouth daily as needed for fluid or edema.  03/27/15   Historical Provider, MD  oxyCODONE-acetaminophen (PERCOCET/ROXICET) 5-325 MG tablet Take 2 tablets by mouth every 4 (four) hours as needed for severe pain. 06/24/15   Nona Dell, PA-C  potassium chloride SA (K-DUR,KLOR-CON) 20 MEQ tablet Take 20 mEq by mouth daily. Reported on 10/03/2015 06/10/15   Historical Provider, MD  ranitidine (ZANTAC) 150 MG capsule Take 1 capsule (150 mg total) by mouth daily. Patient not  taking: Reported on 11/22/2015 11/10/15   Carlos Levering, PA-C    ALLERGIES:  No Known Allergies  SOCIAL HISTORY:  Social History  Substance Use Topics  . Smoking status: Former Smoker    Packs/day: 0.50    Years: 50.00    Types: Cigarettes  . Smokeless tobacco: Never Used  . Alcohol use 0.6 oz/week    1 Cans of beer per week     Comment: daily     FAMILY HISTORY: Family History  Problem Relation Age of Onset  . Diabetes Mother   . Hypertension Mother   . Diabetes Father   . Hypertension Father   . Diabetes Brother   . Hypertension Brother   . Diabetes Sister   . Hypertension Sister   . Diabetes Brother   . Hypertension Brother   . Diabetes Sister   . Hypertension Sister     EXAM: BP 143/83 (BP Location: Left Arm)   Pulse 89   Temp 98.1 F (36.7 C) (Oral)   Resp 18   SpO2 96%  CONSTITUTIONAL: Alert and oriented and responds appropriately to questions. Chronically ill-appearing; Chronically ill-appearing, in no distress HEAD: Normocephalic EYES: Conjunctivae clear, PERRL ENT: normal nose; no rhinorrhea; moist mucous membranes NECK: Supple, no meningismus, no LAD  CARD: RRR; S1 and S2 appreciated; no murmurs, no clicks, no rubs, no gallops RESP: Normal chest excursion without splinting or tachypnea; breath sounds clear and equal bilaterally; no wheezes, no rhonchi, no rales, no hypoxia or respiratory distress, speaking full sentences ABD/GI: Normal bowel sounds; non-distended; soft, non-tender, no rebound, no guarding, no peritoneal signs BACK:  The back appears normal and is non-tender to palpation, there is no CVA tenderness EXT: Normal ROM in all joints; non-tender to palpation; no edema; normal capillary refill; no cyanosis, no calf tenderness or swelling    SKIN: Normal color for age and race; warm; no rash NEURO: Moves all extremities equally, sensation to light touch intact diffusely, cranial nerves II through XII intact, dysarthria, no aphasia PSYCH:  The patient's mood and manner are appropriate. Grooming and personal hygiene are appropriate.  MEDICAL DECISION MAKING: Patient here with complaints of chronic chest pain, shortness of breath, cough and abdominal pain. Workup thus far has been benign he is asking to go home. He has had 2 negative troponins. Chest x-ray is clear. Has stable chronic kidney disease. He has had 2 EKGs that show no new ischemic change, arrhythmia. He has been monitored for several hours. He reports feeling much better.  2:00 AM  Pt's troponin is 2500. This is slightly increased from his last BNP but he does not appear significantly volume overloaded. He states he has had edema in his legs which has improved. Have advised him to continue his diuretics. No hypoxia and chest x-ray appears clear. I do not feel he needs IV diuresis at this time or admission to the hospital. No longer  having chest pain or shortness of breath. Dizziness has resolved. Abdominal exam is benign. I recommended he follow-up with his outpatient cardiologist.    At this time, I do not feel there is any life-threatening condition present. I have reviewed and discussed all results (EKG, imaging, lab, urine as appropriate), exam findings with patient/family. I have reviewed nursing notes and appropriate previous records.  I feel the patient is safe to be discharged home without further emergent workup and can continue workup as an outpatient. Discussed usual and customary return precautions. Patient/family verbalize understanding and are comfortable with this plan.  Outpatient follow-up has been provided. All questions have been answered.      EKG Interpretation  Date/Time:  Thursday November 27 2015 00:16:04 EDT Ventricular Rate:  83 PR Interval:  170 QRS Duration: 105 QT Interval:  444 QTC Calculation: 522 R Axis:   24 Text Interpretation:  Sinus rhythm Probable left atrial enlargement Left ventricular hypertrophy Anterior Q waves, possibly due to  LVH Nonspecific T abnormalities, lateral leads Prolonged QT interval No significant change since last tracing Confirmed by Kelyse Pask,  DO, Cassidy Tabet [phone]) on 11/27/2015 12:19:37 AM         EKG Interpretation  Date/Time:  Thursday November 27 2015 00:16:04 EDT Ventricular Rate:  83 PR Interval:  170 QRS Duration: 105 QT Interval:  444 QTC Calculation: 522 R Axis:   24 Text Interpretation:  Sinus rhythm Probable left atrial enlargement Left ventricular hypertrophy Anterior Q waves, possibly due to LVH Nonspecific T abnormalities, lateral leads Prolonged QT interval No significant change since last tracing Confirmed by Dexter Sauser,  DO, Earlyne Feeser [phone]) on 11/27/2015 12:19:37 AM         I personally performed the services described in this documentation, which was scribed in my presence. The recorded information has been reviewed and is accurate.       [place], DO 11/27/15 0157

## 2015-12-01 ENCOUNTER — Emergency Department (HOSPITAL_COMMUNITY): Payer: Medicare Other

## 2015-12-01 ENCOUNTER — Encounter (HOSPITAL_COMMUNITY): Payer: Self-pay | Admitting: Emergency Medicine

## 2015-12-01 ENCOUNTER — Emergency Department (HOSPITAL_COMMUNITY)
Admission: EM | Admit: 2015-12-01 | Discharge: 2015-12-01 | Disposition: A | Discharge status: Home / Self Care | Payer: Medicare Other | Attending: Physician Assistant | Admitting: Physician Assistant

## 2015-12-01 DIAGNOSIS — Z7982 Long term (current) use of aspirin: Secondary | ICD-10-CM | POA: Diagnosis not present

## 2015-12-01 DIAGNOSIS — Z87891 Personal history of nicotine dependence: Secondary | ICD-10-CM | POA: Insufficient documentation

## 2015-12-01 DIAGNOSIS — R0602 Shortness of breath: Secondary | ICD-10-CM | POA: Diagnosis present

## 2015-12-01 DIAGNOSIS — I130 Hypertensive heart and chronic kidney disease with heart failure and stage 1 through stage 4 chronic kidney disease, or unspecified chronic kidney disease: Secondary | ICD-10-CM | POA: Insufficient documentation

## 2015-12-01 DIAGNOSIS — N183 Chronic kidney disease, stage 3 (moderate): Secondary | ICD-10-CM | POA: Insufficient documentation

## 2015-12-01 DIAGNOSIS — I5043 Acute on chronic combined systolic (congestive) and diastolic (congestive) heart failure: Secondary | ICD-10-CM | POA: Insufficient documentation

## 2015-12-01 DIAGNOSIS — Z79899 Other long term (current) drug therapy: Secondary | ICD-10-CM | POA: Diagnosis not present

## 2015-12-01 DIAGNOSIS — I2510 Atherosclerotic heart disease of native coronary artery without angina pectoris: Secondary | ICD-10-CM | POA: Insufficient documentation

## 2015-12-01 DIAGNOSIS — R600 Localized edema: Secondary | ICD-10-CM | POA: Diagnosis not present

## 2015-12-01 DIAGNOSIS — J441 Chronic obstructive pulmonary disease with (acute) exacerbation: Secondary | ICD-10-CM | POA: Diagnosis not present

## 2015-12-01 MED ORDER — FUROSEMIDE 40 MG PO TABS
80.0000 mg | ORAL_TABLET | Freq: Once | ORAL | Status: DC
  Filled 2015-12-01: qty 2

## 2015-12-01 NOTE — Discharge Instructions (Signed)
Please get your Lasix perscription today as planned!  Return with shortness of breath worsening, or chest pain.

## 2015-12-01 NOTE — ED Triage Notes (Signed)
Patient presents for SOB and bilateral ankle swelling x2-3 weeks. Reports he's been out of his Lasix x1-2 days. Reports non productive cough. Denies fever, N/V, numbness or tingling. A&O x4.

## 2015-12-01 NOTE — ED Notes (Signed)
Patient ambulatory from room.  Patient threw discharge papers in trash can.

## 2015-12-01 NOTE — ED Provider Notes (Signed)
[facility] DEPT Provider Note   CSN: [id number] Arrival date & time: 12/01/15  1345     History   Chief Complaint Chief Complaint  Patient presents with  . Shortness of Breath    HPI Bruce [name] is a 70 y.o. male.  The history is provided by the patient.  Shortness of Breath  This is a chronic problem. The problem occurs intermittently.The current episode started more than 1 week ago. The problem has not changed since onset.Associated symptoms include cough and leg swelling. Pertinent negatives include no fever, no chest pain and no abdominal pain. He has tried beta-agonist inhalers for the symptoms. The treatment provided no relief. He has had prior ED visits. Associated medical issues comments: CHF on lasix, hasn't taken for 2 days..    Past Medical History:  Diagnosis Date  . Bell's palsy   . CKD (chronic kidney disease), stage II   . CML (chronic myelocytic leukemia) ([place])   . Coronary artery disease   . Hypercholesterolemia   . Hypertension   . Leukemia ([place])   . Stroke ([place])    No residual limb weakness.  Walks with cane at baseline.   Marland Kitchen TIA (transient ischemic attack) 05/10/2014    Patient Active Problem List   Diagnosis Date Noted  . Elevated lactic acid level 10/11/2015  . Nausea & vomiting 10/10/2015  . CHF exacerbation ([place]) 10/10/2015  . CKD (chronic kidney disease), stage II   . Acute on chronic systolic (congestive) heart failure ([place]) 06/03/2015  . Dizziness 01/25/2015  . Acute on chronic combined systolic and diastolic CHF (congestive heart failure) ([place]) 01/25/2015  . Essential hypertension 01/25/2015  . Compliance poor 01/25/2015  . Coronary artery disease   . Cardiomyopathy, ischemic 09/24/2014  . H/O: stroke with residual effects 09/13/2014  . Transaminitis 09/13/2014  . Acute kidney injury ([place]) 09/13/2014  . Abdominal pain 09/13/2014  . Elevated troponin 09/13/2014  . TIA (transient ischemic attack) 05/10/2014  . CML (chronic  myelocytic leukemia) ([place]) 03/27/2014  . Leukocytosis 03/18/2014  . Chest pain 03/05/2014    Past Surgical History:  Procedure Laterality Date  . BACK SURGERY    . HIP ARTHROPLASTY Right    orif  . ORIF FOREARM FRACTURE Right        Home Medications    Prior to Admission medications   Medication Sig Start Date End Date Taking? Authorizing Provider  acetaminophen (TYLENOL) 325 MG tablet Take 2 tablets (650 mg total) by mouth every 4 (four) hours as needed for headache or mild pain. 06/06/15   [name], MD  albuterol (PROVENTIL HFA;VENTOLIN HFA) 108 (90 BASE) MCG/ACT inhaler Inhale 2 puffs into the lungs every 4 (four) hours as needed for wheezing or shortness of breath. 08/08/14   Noemi Chapel, MD  amiodarone (PACERONE) 200 MG tablet Take 1 tablet (200 mg total) by mouth daily. 10/15/15   [name], MD  aspirin 81 MG chewable tablet Chew 1 tablet (81 mg total) by mouth daily. 09/22/14   Hosie Poisson, MD  atorvastatin (LIPITOR) 40 MG tablet Take 40 mg by mouth daily. 08/23/15   Historical Provider, MD  bisacodyl (DULCOLAX) 5 MG EC tablet Take 2 tablets (10 mg total) by mouth daily as needed for moderate constipation. 10/15/15   [name], MD  Calcium Carbonate Antacid (MAALOX) 600 MG chewable tablet Chew 1 tablet (600 mg total) by mouth as needed for heartburn. 11/10/15   Samantha Tripp Dowless, PA-C  carvedilol (COREG) 3.125 MG tablet Take 3.125 mg  by mouth 2 (two) times daily. 10/16/15   Historical Provider, MD  clopidogrel (PLAVIX) 75 MG tablet Take 1 tablet (75 mg total) by mouth daily. 03/21/14   [name], MD  digoxin (LANOXIN) 0.125 MG tablet Take 1 tablet (0.125 mg total) by mouth daily. 01/29/15   Barton Dubois, MD  ENTRESTO 24-26 MG Take 1 tablet by mouth 2 (two) times daily.  08/05/15   Historical Provider, MD  furosemide (LASIX) 80 MG tablet Take 80 mg by mouth daily as needed for fluid or edema.  03/27/15   Historical Provider, MD  oxyCODONE-acetaminophen  (PERCOCET/ROXICET) 5-325 MG tablet Take 2 tablets by mouth every 4 (four) hours as needed for severe pain. 06/24/15   Nona Dell, PA-C  potassium chloride SA (K-DUR,KLOR-CON) 20 MEQ tablet Take 20 mEq by mouth daily. Reported on 10/03/2015 06/10/15   Historical Provider, MD  ranitidine (ZANTAC) 150 MG capsule Take 1 capsule (150 mg total) by mouth daily. Patient not taking: Reported on 11/22/2015 11/10/15   Carlos Levering, PA-C    Family History Family History  Problem Relation Age of Onset  . Diabetes Mother   . Hypertension Mother   . Diabetes Father   . Hypertension Father   . Diabetes Brother   . Hypertension Brother   . Diabetes Sister   . Hypertension Sister   . Diabetes Brother   . Hypertension Brother   . Diabetes Sister   . Hypertension Sister     Social History Social History  Substance Use Topics  . Smoking status: Former Smoker    Packs/day: 0.50    Years: 50.00    Types: Cigarettes  . Smokeless tobacco: Never Used  . Alcohol use 0.6 oz/week    1 Cans of beer per week     Comment: daily      Allergies   Review of patient's allergies indicates no known allergies.   Review of Systems Review of Systems  Constitutional: Negative for activity change, fatigue and fever.  HENT: Negative for congestion and sinus pressure.   Respiratory: Positive for cough and shortness of breath. Negative for chest tightness.   Cardiovascular: Positive for leg swelling. Negative for chest pain.  Gastrointestinal: Negative for abdominal pain.  All other systems reviewed and are negative.    Physical Exam Updated Vital Signs BP 111/68 (BP Location: Left Arm)   Pulse 70   Temp 98.3 F (36.8 C) (Oral)   Resp 18   SpO2 97%   Physical Exam  Constitutional: He appears well-developed and well-nourished.  HENT:  Head: Normocephalic and atraumatic.  Eyes: Conjunctivae are normal.  Neck: Neck supple.  Cardiovascular: Normal rate and regular rhythm.   No  murmur heard. Pulmonary/Chest: Effort normal and breath sounds normal. No respiratory distress.  Trace rales  Abdominal: Soft. There is no tenderness.  Musculoskeletal: He exhibits edema.  +1 pittiong edema bilaterally  Neurological: He is alert.  Skin: Skin is warm and dry.  Psychiatric: He has a normal mood and affect.  Nursing note and vitals reviewed.    ED Treatments / Results  Labs (all labs ordered are listed, but only abnormal results are displayed) Labs Reviewed - No data to display  EKG  EKG Interpretation  Date/Time:  Monday December 01 2015 14:07:08 EDT Ventricular Rate:  66 PR Interval:    QRS Duration: 107 QT Interval:  504 QTC Calculation: 529 R Axis:   -22 Text Interpretation:  Sinus rhythm Borderline left axis deviation Abnormal T, consider ischemia, lateral  leads Minimal ST elevation, inferior leads Prolonged QT interval Artifact Rate is slower No significant change since last tracing Reconfirmed by Gerald Leitz ([id number]) on 12/01/2015 4:27:48 PM       Radiology Dg Chest 2 View  Result Date: 12/01/2015 CLINICAL DATA:  Shortness of breath bilateral ankle swelling for the past 2-3 weeks. Nonproductive cough. EXAM: CHEST  2 VIEW COMPARISON:  11/26/2015. FINDINGS: Stable enlarged cardiac silhouette. Clear lungs. Minimal diffuse peribronchial thickening. Thoracic spine degenerative changes. IMPRESSION: No acute abnormality. Stable cardiomegaly in minimal chronic bronchitic changes. Electronically Signed   By: Claudie Revering M.D.   On: 12/01/2015 14:58    Procedures Procedures (including critical care time)  Medications Ordered in ED Medications - No data to display   Initial Impression / Assessment and Plan / ED Course  I have reviewed the triage vital signs and the nursing notes.  Pertinent labs & imaging results that were available during my care of the patient were reviewed by me and considered in my medical decision making (see chart for  details).  Clinical Course   Pt is a friednly 70 year old male with history of HTN, CAD, CHF seen often in the ED.  He is here today for edema to bilateral legs. He has missed two days of Lasix, but is able to fill it today.  He has not had any CP, mild occasional SOB.  Patietn PE normal, 99% on RA, no increased WOB.  He also compalins of a bulding vein in his neck, appears normal.  Pt appears at baseline, will give his home dose Lasix, have him fill it as an outpatient as planned.   He was just seen for this 5 days ago and righ tbefore that. He had serial trops, neg chest xray and was discharged home.  Pt states he just wants his blood pressure checked and make sure "everything is okay".   Patient is comfortable, ambulatory, and taking PO at time of discharge.  Patient expressed understanding about return precautions.     Final Clinical Impressions(s) / ED Diagnoses   Final diagnoses:  None    New Prescriptions New Prescriptions   No medications on file     Farhana Fellows Julio Alm, MD 12/01/15 1712

## 2015-12-03 ENCOUNTER — Emergency Department (HOSPITAL_COMMUNITY)
Admission: EM | Admit: 2015-12-03 | Discharge: 2015-12-03 | Disposition: A | Discharge status: Home / Self Care | Payer: Medicare Other | Attending: Emergency Medicine | Admitting: Emergency Medicine

## 2015-12-03 ENCOUNTER — Emergency Department (HOSPITAL_COMMUNITY): Payer: Medicare Other

## 2015-12-03 ENCOUNTER — Encounter (HOSPITAL_COMMUNITY): Payer: Self-pay | Admitting: Neurology

## 2015-12-03 DIAGNOSIS — I130 Hypertensive heart and chronic kidney disease with heart failure and stage 1 through stage 4 chronic kidney disease, or unspecified chronic kidney disease: Secondary | ICD-10-CM | POA: Diagnosis not present

## 2015-12-03 DIAGNOSIS — Z8673 Personal history of transient ischemic attack (TIA), and cerebral infarction without residual deficits: Secondary | ICD-10-CM | POA: Diagnosis not present

## 2015-12-03 DIAGNOSIS — Z96641 Presence of right artificial hip joint: Secondary | ICD-10-CM | POA: Diagnosis not present

## 2015-12-03 DIAGNOSIS — I2510 Atherosclerotic heart disease of native coronary artery without angina pectoris: Secondary | ICD-10-CM | POA: Diagnosis not present

## 2015-12-03 DIAGNOSIS — Z87891 Personal history of nicotine dependence: Secondary | ICD-10-CM | POA: Diagnosis not present

## 2015-12-03 DIAGNOSIS — N182 Chronic kidney disease, stage 2 (mild): Secondary | ICD-10-CM | POA: Diagnosis not present

## 2015-12-03 DIAGNOSIS — R7989 Other specified abnormal findings of blood chemistry: Secondary | ICD-10-CM

## 2015-12-03 DIAGNOSIS — R0602 Shortness of breath: Secondary | ICD-10-CM

## 2015-12-03 DIAGNOSIS — Z7982 Long term (current) use of aspirin: Secondary | ICD-10-CM | POA: Insufficient documentation

## 2015-12-03 DIAGNOSIS — Z79899 Other long term (current) drug therapy: Secondary | ICD-10-CM | POA: Diagnosis not present

## 2015-12-03 DIAGNOSIS — I509 Heart failure, unspecified: Secondary | ICD-10-CM | POA: Insufficient documentation

## 2015-12-03 LAB — CBC WITH DIFFERENTIAL/PLATELET
BASOS ABS: 0.9 10*3/uL — AB (ref 0.0–0.1)
Basophils Relative: 4 %
EOS ABS: 0.2 10*3/uL (ref 0.0–0.7)
Eosinophils Relative: 1 %
HCT: 35.1 % — ABNORMAL LOW (ref 39.0–52.0)
Hemoglobin: 11 g/dL — ABNORMAL LOW (ref 13.0–17.0)
LYMPHS PCT: 10 %
Lymphs Abs: 2.3 10*3/uL (ref 0.7–4.0)
MCH: 31.6 pg (ref 26.0–34.0)
MCHC: 31.3 g/dL (ref 30.0–36.0)
MCV: 100.9 fL — ABNORMAL HIGH (ref 78.0–100.0)
Monocytes Absolute: 2.1 10*3/uL — ABNORMAL HIGH (ref 0.1–1.0)
Monocytes Relative: 9 %
NEUTROS PCT: 76 %
Neutro Abs: 17.6 10*3/uL — ABNORMAL HIGH (ref 1.7–7.7)
PLATELETS: 614 10*3/uL — AB (ref 150–400)
RBC: 3.48 MIL/uL — AB (ref 4.22–5.81)
RDW: 16.2 % — ABNORMAL HIGH (ref 11.5–15.5)
WBC: 23.1 10*3/uL — ABNORMAL HIGH (ref 4.0–10.5)

## 2015-12-03 LAB — COMPREHENSIVE METABOLIC PANEL
ALK PHOS: 219 U/L — AB (ref 38–126)
ALT: 18 U/L (ref 17–63)
ANION GAP: 10 (ref 5–15)
AST: 27 U/L (ref 15–41)
Albumin: 3.6 g/dL (ref 3.5–5.0)
BILIRUBIN TOTAL: 2.1 mg/dL — AB (ref 0.3–1.2)
BUN: 15 mg/dL (ref 6–20)
CALCIUM: 9.4 mg/dL (ref 8.9–10.3)
CO2: 19 mmol/L — ABNORMAL LOW (ref 22–32)
Chloride: 106 mmol/L (ref 101–111)
Creatinine, Ser: 1.31 mg/dL — ABNORMAL HIGH (ref 0.61–1.24)
GFR calc non Af Amer: 54 mL/min — ABNORMAL LOW (ref >60)
Glucose, Bld: 110 mg/dL — ABNORMAL HIGH (ref 65–99)
POTASSIUM: 4.4 mmol/L (ref 3.5–5.1)
SODIUM: 135 mmol/L (ref 135–145)
TOTAL PROTEIN: 6.7 g/dL (ref 6.5–8.1)

## 2015-12-03 LAB — TROPONIN I: Troponin I: 0.04 ng/mL (ref <0.03)

## 2015-12-03 LAB — I-STAT TROPONIN, ED
TROPONIN I, POC: 0.01 ng/mL (ref 0.00–0.08)
TROPONIN I, POC: 0.02 ng/mL (ref 0.00–0.08)

## 2015-12-03 LAB — D-DIMER, QUANTITATIVE (NOT AT ARMC): D DIMER QUANT: 1.95 ug{FEU}/mL — AB (ref 0.00–0.50)

## 2015-12-03 LAB — DIGOXIN LEVEL: Digoxin Level: <0.2 ng/mL — ABNORMAL LOW (ref 0.8–2.0)

## 2015-12-03 LAB — BRAIN NATRIURETIC PEPTIDE: B NATRIURETIC PEPTIDE 5: 3489.1 pg/mL — AB (ref 0.0–100.0)

## 2015-12-03 MED ORDER — FUROSEMIDE 80 MG PO TABS: 80.0000 mg | ORAL_TABLET | Freq: Two times a day (BID) | ORAL | 0 refills | Status: DC

## 2015-12-03 MED ORDER — ASPIRIN 81 MG PO CHEW
324.0000 mg | CHEWABLE_TABLET | Freq: Once | ORAL | Status: AC
  Administered 2015-12-03: 324 mg via ORAL
  Filled 2015-12-03: qty 4

## 2015-12-03 MED ORDER — FUROSEMIDE 10 MG/ML IJ SOLN
80.0000 mg | Freq: Once | INTRAMUSCULAR | Status: AC
  Administered 2015-12-03: 80 mg via INTRAVENOUS
  Filled 2015-12-03: qty 8

## 2015-12-03 MED ORDER — IOPAMIDOL (ISOVUE-370) INJECTION 76%
INTRAVENOUS | Status: AC
  Administered 2015-12-03: 100 mL
  Filled 2015-12-03: qty 100

## 2015-12-03 NOTE — Discharge Instructions (Signed)
START TAKING YOUR FUORSEMIDE (LASIX) 11 MG TABLETS TWICE A DAY FOR THE NEXT TWO DAYS. YOUR NEXT DOSE SHOULD BE TONIGHT.  GO SEE DR. HARWANI TOMORROW AT 1:00 PM. IT IS VERY IMPORTANT THAT YOU MAKE THIS APPOINTMENT.

## 2015-12-03 NOTE — ED Triage Notes (Signed)
Pt here for sob, weakness x 3 weeks. Also has swelling in his legs and hernia he wants to be admitted for. Pt denies any pain, is a x 4.

## 2015-12-03 NOTE — ED Notes (Signed)
Ambulated patient down the hallway and back to his bed.  He tolerated well with his cane, but still complains of bilateral weakness and overall dizziness.

## 2015-12-03 NOTE — ED Provider Notes (Signed)
[facility] DEPT Provider Note   CSN: [id number] Arrival date & time: 12/03/15  [id number]     History   Chief Complaint Chief Complaint  Patient presents with  . Shortness of Breath    HPI Bruce [name] is a 70 y.o. male.  The history is provided by the patient and medical records.   70 year old male with extensive past medical history including hypertension, hyperlipidemia, coronary artery disease CML and CHF who presents with multiple complaints. His primary complaint is acute on chronic worsening shortness of breath, leg swelling. The patient has been evaluated multiple times in the ED over the last week or 2 for similar complaints. He states he has had increasing lower extremity edema that improves mildly with elevation, but then returned throughout the day. He has also had worsening of his acute on chronic cough. His cough is now productive of small amounts of white sputum and he has some nausea associated with it. He also endorses generalized shortness of breath is worse with any exertion. He is having difficulty getting around the house. He also endorses worsening orthopnea. Patient is also concerned about a ventral abdominal hernia. He feels that all of his symptoms are due to this. He denies any vomiting. He is not constipated and is having regular bowel movements.  Past Medical History:  Diagnosis Date  . Bell's palsy   . CKD (chronic kidney disease), stage II   . CML (chronic myelocytic leukemia) ([place])   . Coronary artery disease   . Hypercholesterolemia   . Hypertension   . Leukemia ([place])   . Stroke ([place])    No residual limb weakness.  Walks with cane at baseline.   Marland Kitchen TIA (transient ischemic attack) 05/10/2014    Patient Active Problem List   Diagnosis Date Noted  . Elevated lactic acid level 10/11/2015  . Nausea & vomiting 10/10/2015  . CHF exacerbation ([place]) 10/10/2015  . CKD (chronic kidney disease), stage II   . Acute on chronic systolic (congestive) heart  failure ([place]) 06/03/2015  . Dizziness 01/25/2015  . Acute on chronic combined systolic and diastolic CHF (congestive heart failure) ([place]) 01/25/2015  . Essential hypertension 01/25/2015  . Compliance poor 01/25/2015  . Coronary artery disease   . Cardiomyopathy, ischemic 09/24/2014  . H/O: stroke with residual effects 09/13/2014  . Transaminitis 09/13/2014  . Acute kidney injury ([place]) 09/13/2014  . Abdominal pain 09/13/2014  . Elevated troponin 09/13/2014  . TIA (transient ischemic attack) 05/10/2014  . CML (chronic myelocytic leukemia) ([place]) 03/27/2014  . Leukocytosis 03/18/2014  . Chest pain 03/05/2014    Past Surgical History:  Procedure Laterality Date  . BACK SURGERY    . HIP ARTHROPLASTY Right    orif  . ORIF FOREARM FRACTURE Right        Home Medications    Prior to Admission medications   Medication Sig Start Date End Date Taking? Authorizing Provider  acetaminophen (TYLENOL) 325 MG tablet Take 2 tablets (650 mg total) by mouth every 4 (four) hours as needed for headache or mild pain. 06/06/15  Yes [name], MD  albuterol (PROVENTIL HFA;VENTOLIN HFA) 108 (90 BASE) MCG/ACT inhaler Inhale 2 puffs into the lungs every 4 (four) hours as needed for wheezing or shortness of breath. 08/08/14  Yes Noemi Chapel, MD  ALPRAZolam Duanne Moron) 0.25 MG tablet Take 0.25 mg by mouth 2 (two) times daily as needed for anxiety.  11/28/15  Yes Historical Provider, MD  amiodarone (PACERONE) 200 MG tablet Take 1 tablet (200 mg  total) by mouth daily. 10/15/15  Yes [name], MD  aspirin 81 MG chewable tablet Chew 1 tablet (81 mg total) by mouth daily. 09/22/14  Yes Hosie Poisson, MD  atorvastatin (LIPITOR) 40 MG tablet Take 40 mg by mouth daily. 08/23/15  Yes Historical Provider, MD  bisacodyl (DULCOLAX) 5 MG EC tablet Take 2 tablets (10 mg total) by mouth daily as needed for moderate constipation. 10/15/15  Yes [name], MD  Calcium Carbonate Antacid (MAALOX) 600 MG chewable tablet Chew 1  tablet (600 mg total) by mouth as needed for heartburn. 11/10/15  Yes Samantha Tripp Dowless, PA-C  carvedilol (COREG) 3.125 MG tablet Take 3.125 mg by mouth 2 (two) times daily. 10/16/15  Yes Historical Provider, MD  digoxin (LANOXIN) 0.125 MG tablet Take 1 tablet (0.125 mg total) by mouth daily. 01/29/15  Yes Barton Dubois, MD  ENTRESTO 24-26 MG Take 1 tablet by mouth 2 (two) times daily.  08/05/15  Yes Historical Provider, MD  oxyCODONE-acetaminophen (PERCOCET/ROXICET) 5-325 MG tablet Take 2 tablets by mouth every 4 (four) hours as needed for severe pain. 06/24/15  Yes Chesley Noon Nadeau, PA-C  potassium chloride SA (K-DUR,KLOR-CON) 20 MEQ tablet Take 20 mEq by mouth daily. Reported on 10/03/2015 06/10/15  Yes Historical Provider, MD  ranitidine (ZANTAC) 150 MG capsule Take 1 capsule (150 mg total) by mouth daily. 11/10/15  Yes Samantha Tripp Dowless, PA-C  clopidogrel (PLAVIX) 75 MG tablet Take 1 tablet (75 mg total) by mouth daily. 03/21/14   [name], MD  furosemide (LASIX) 80 MG tablet Take 1 tablet (80 mg total) by mouth 2 (two) times daily. 12/03/15 12/05/15  Duffy Bruce, MD    Family History Family History  Problem Relation Age of Onset  . Diabetes Mother   . Hypertension Mother   . Diabetes Father   . Hypertension Father   . Diabetes Brother   . Hypertension Brother   . Diabetes Sister   . Hypertension Sister   . Diabetes Brother   . Hypertension Brother   . Diabetes Sister   . Hypertension Sister     Social History Social History  Substance Use Topics  . Smoking status: Former Smoker    Packs/day: 0.50    Years: 50.00    Types: Cigarettes  . Smokeless tobacco: Never Used  . Alcohol use 0.6 oz/week    1 Cans of beer per week     Comment: daily      Allergies   Review of patient's allergies indicates no known allergies.   Review of Systems Review of Systems  Constitutional: Positive for fatigue. Negative for chills and fever.  HENT: Negative for  congestion and rhinorrhea.   Eyes: Negative for visual disturbance.  Respiratory: Positive for cough and shortness of breath. Negative for wheezing.   Cardiovascular: Positive for leg swelling. Negative for chest pain.  Gastrointestinal: Positive for abdominal distention. Negative for abdominal pain, diarrhea, nausea and vomiting.  Genitourinary: Negative for dysuria and flank pain.  Musculoskeletal: Negative for neck pain and neck stiffness.  Skin: Negative for rash and wound.  Allergic/Immunologic: Negative for immunocompromised state.  Neurological: Negative for syncope, weakness and headaches.  All other systems reviewed and are negative.    Physical Exam Updated Vital Signs BP 115/69 (BP Location: Right Arm)   Pulse 67   Temp 97.6 F (36.4 C) (Oral)   Resp 16   SpO2 100%   Physical Exam  Constitutional: He is oriented to person, place, and time. He appears well-developed and  well-nourished. No distress.  HENT:  Head: Normocephalic and atraumatic.  Eyes: Conjunctivae are normal.  Neck: Neck supple.  Cardiovascular: Normal rate, regular rhythm and normal heart sounds.  Exam reveals no friction rub.   No murmur heard. Pulmonary/Chest: Effort normal. No respiratory distress. He has no wheezes. He has rales (Mild, bibasilar).  Frequent productive cough   Abdominal: Soft. He exhibits no distension.  Soft, easily reducible ventral hernia  Musculoskeletal: He exhibits edema (2+ pitting bilaterally).  Neurological: He is alert and oriented to person, place, and time. He exhibits normal muscle tone.  Skin: Skin is warm. Capillary refill takes less than 2 seconds.  Psychiatric: He has a normal mood and affect.  Nursing note and vitals reviewed.    ED Treatments / Results  Labs (all labs ordered are listed, but only abnormal results are displayed) Labs Reviewed  COMPREHENSIVE METABOLIC PANEL - Abnormal; Notable for the following:       Result Value   CO2 19 (*)    Glucose,  Bld 110 (*)    Creatinine, Ser 1.31 (*)    Alkaline Phosphatase 219 (*)    Total Bilirubin 2.1 (*)    GFR calc non Af Amer 54 (*)    All other components within normal limits  CBC WITH DIFFERENTIAL/PLATELET - Abnormal; Notable for the following:    WBC 23.1 (*)    RBC 3.48 (*)    Hemoglobin 11.0 (*)    HCT 35.1 (*)    MCV 100.9 (*)    RDW 16.2 (*)    Platelets 614 (*)    Neutro Abs 17.6 (*)    Monocytes Absolute 2.1 (*)    Basophils Absolute 0.9 (*)    All other components within normal limits  BRAIN NATRIURETIC PEPTIDE - Abnormal; Notable for the following:    B Natriuretic Peptide 3,489.1 (*)    All other components within normal limits  D-DIMER, QUANTITATIVE (NOT AT [facility]) - Abnormal; Notable for the following:    D-Dimer, Quant 1.95 (*)    All other components within normal limits  DIGOXIN LEVEL - Abnormal; Notable for the following:    Digoxin Level <0.2 (*)    All other components within normal limits  TROPONIN I - Abnormal; Notable for the following:    Troponin I 0.04 (*)    All other components within normal limits  I-STAT TROPOININ, ED  I-STAT TROPOININ, ED    EKG  EKG Interpretation  Date/Time:  Wednesday December 03 2015 09:24:37 EDT Ventricular Rate:  112 PR Interval:    QRS Duration: 98 QT Interval:  366 QTC Calculation: 499 R Axis:   -27 Text Interpretation:  Accelerated Junctional rhythm Anterior infarct , age undetermined T wave abnormality, consider lateral ischemia Abnormal ECG Since last tracing, significant changes have occurred Although rate has increased TWI more evidence in V5-V6 Possible junctional rhythm versus SVT Confirmed by Ellender Hose MD, Careli Luzader [phone]) on 12/03/2015 9:36:26 AM       Radiology Dg Chest 2 View  Result Date: 12/03/2015 CLINICAL DATA:  Five day history of shortness of breath and cough EXAM: CHEST  2 VIEW COMPARISON:  December 01, 2015 FINDINGS: There is no edema or consolidation. Heart is mildly enlarged with pulmonary vascularity  within normal limits. No adenopathy. There is degenerative change in the thoracic spine. IMPRESSION: No edema or consolidation.  Stable mild cardiac prominence. Electronically Signed   By: Lowella Grip III M.D.   On: 12/03/2015 10:18   Ct Angio Chest Pe W  Or Wo Contrast  Result Date: 12/03/2015 CLINICAL DATA:  Shortness of breath and weakness for 3 weeks EXAM: CT ANGIOGRAPHY CHEST WITH CONTRAST TECHNIQUE: Multidetector CT imaging of the chest was performed using the standard protocol during bolus administration of intravenous contrast. Multiplanar CT image reconstructions and MIPs were obtained to evaluate the vascular anatomy. CONTRAST:  85 cc Isovue 370 COMPARISON:  08/08/2014 FINDINGS: Cardiovascular: Heart is enlarged. No pericardial effusion. No thoracic aortic aneurysm. No filling defects in the opacified pulmonary arteries to suggest the presence of an acute pulmonary embolus. Mediastinum/Nodes: Small mediastinal lymph nodes are stable in the interval without lymphadenopathy. There is no hilar lymphadenopathy. There is no axillary lymphadenopathy. The esophagus has normal imaging features. Lungs/Pleura: No focal airspace consolidation. No pulmonary edema or pleural effusion. No suspicious pulmonary nodule or mass. Lung show mosaic attenuation in a pattern that would be compatible with small airways disease or chronic pulmonary embolism. Bronchial wall thickening is noted in the lower lobes bilaterally. Upper Abdomen: Small volume intraperitoneal free fluid noted around the liver. Layering gallstones are seen in the gallbladder. Multiple calcifications around the pancreas suggests calcified lymph nodes. Musculoskeletal: Bone windows reveal no worrisome lytic or sclerotic osseous lesions. Review of the MIP images confirms the above findings. IMPRESSION: 1. No CT evidence for acute pulmonary embolus. 2. Mosaic ground-glass attenuation in the lungs compatible with small airways disease or chronic  pulmonary embolism. 3. Cholelithiasis. 4. Small volume perihepatic ascites. Electronically Signed   By: Misty Stanley M.D.   On: 12/03/2015 15:03    Procedures Procedures (including critical care time)  Medications Ordered in ED Medications  aspirin chewable tablet 324 mg (324 mg Oral Given 12/03/15 1037)  furosemide (LASIX) injection 80 mg (80 mg Intravenous Given 12/03/15 1222)  iopamidol (ISOVUE-370) 76 % injection (100 mLs  Contrast Given 12/03/15 1440)     Initial Impression / Assessment and Plan / ED Course  I have reviewed the triage vital signs and the nursing notes.  Pertinent labs & imaging results that were available during my care of the patient were reviewed by me and considered in my medical decision making (see chart for details).  Clinical Course  70 year old male with extensive past medical history including hypertension, hyperlipidemia, coronary artery disease. CML and CHF who presents with shortness of breath, leg swelling and symptomatic abdominal hernia. On arrival, patient tachycardic with possible junctional tachycardia. He is hemodynamically stable. He is not hypoxic. His exam shows mild hypervolemia but is otherwise unremarkable. He has normal work of breathing. I suspect his symptoms are secondary to noncompliance with Lasix and worsening CHF. Regarding his abdominal pain, he has a chronic ventral abdominal hernia. It is soft and easily reducible. He has no nausea, vomiting, or signs of obstruction. Will refer him for outpatient management of this. No apparent peritonitis or obstruction. I suspect he may have some component of ascites due to congestive heart failure which may be exacerbating his symptoms. Will check screening labs and reassess.  Lab work and imaging is reviewed as above. CBC shows chronic leukocytosis, thrombocytosis, and anemia due to his CML. This is unchanged. CMP is unremarkable and his renal function is at baseline. He has a mild decreased  bicarbonate. BNP continues to trend upward at 3489. Initial i-STAT troponin is negative. D-dimer is positive. Chest x-ray is unremarkable. Will check for PE, and discussed with patient's cardiologist, Dr. Terrence Dupont.  CTA PE is negative. I discussed the case with Dr. Terrence Dupont. He would like to give the patient Lasix  and reassess with plan for discharge. He is well aware of the patient's lab work as well as his junctional tachycardia. Patient has an extensive history of this and he does not feel the patient would benefit from admission.  After IV Lasix, patient has had over 1 L of urine output. He feels symptomatically improved. His delta troponin is negative. Of note, formal troponin is positive is 0.04, but this is decreased from previous values. Otherwise, patient is now in sinus rhythm. He is and auditory without difficulty. He will follow-up with his cardiologist tomorrow. Will double lasix dose to BID per Dr. Terrence Dupont.   Final Clinical Impressions(s) / ED Diagnoses   Final diagnoses:  SOB (shortness of breath)  Elevated brain natriuretic peptide (BNP) level  Acute on chronic congestive heart failure, unspecified congestive heart failure type [facility])    New Prescriptions Discharge Medication List as of 12/03/2015  4:16 PM       Duffy Bruce, MD 12/03/15 [facility]ald

## 2015-12-17 ENCOUNTER — Ambulatory Visit (HOSPITAL_COMMUNITY)
Admission: RE | Admit: 2015-12-17 | Discharge: 2015-12-17 | Disposition: A | Discharge status: Home / Self Care | Payer: Medicare Other | Source: Ambulatory Visit | Attending: Internal Medicine | Admitting: Internal Medicine

## 2015-12-17 ENCOUNTER — Telehealth (HOSPITAL_COMMUNITY): Payer: Self-pay | Admitting: Surgery

## 2015-12-17 ENCOUNTER — Encounter (HOSPITAL_COMMUNITY): Payer: Self-pay

## 2015-12-17 VITALS — BP 110/62 | HR 63 | Wt 166.5 lb

## 2015-12-17 DIAGNOSIS — I5023 Acute on chronic systolic (congestive) heart failure: Secondary | ICD-10-CM

## 2015-12-17 DIAGNOSIS — I5022 Chronic systolic (congestive) heart failure: Secondary | ICD-10-CM | POA: Insufficient documentation

## 2015-12-17 DIAGNOSIS — Z7902 Long term (current) use of antithrombotics/antiplatelets: Secondary | ICD-10-CM | POA: Diagnosis not present

## 2015-12-17 DIAGNOSIS — Z8673 Personal history of transient ischemic attack (TIA), and cerebral infarction without residual deficits: Secondary | ICD-10-CM | POA: Diagnosis not present

## 2015-12-17 DIAGNOSIS — I2510 Atherosclerotic heart disease of native coronary artery without angina pectoris: Secondary | ICD-10-CM | POA: Diagnosis not present

## 2015-12-17 DIAGNOSIS — C9210 Chronic myeloid leukemia, BCR/ABL-positive, not having achieved remission: Secondary | ICD-10-CM

## 2015-12-17 DIAGNOSIS — Z7982 Long term (current) use of aspirin: Secondary | ICD-10-CM | POA: Insufficient documentation

## 2015-12-17 DIAGNOSIS — I429 Cardiomyopathy, unspecified: Secondary | ICD-10-CM | POA: Insufficient documentation

## 2015-12-17 DIAGNOSIS — I110 Hypertensive heart disease with heart failure: Secondary | ICD-10-CM | POA: Insufficient documentation

## 2015-12-17 DIAGNOSIS — I471 Supraventricular tachycardia: Secondary | ICD-10-CM | POA: Diagnosis not present

## 2015-12-17 DIAGNOSIS — Z856 Personal history of leukemia: Secondary | ICD-10-CM | POA: Diagnosis not present

## 2015-12-17 DIAGNOSIS — E785 Hyperlipidemia, unspecified: Secondary | ICD-10-CM | POA: Insufficient documentation

## 2015-12-17 LAB — COMPREHENSIVE METABOLIC PANEL
ALBUMIN: 3.9 g/dL (ref 3.5–5.0)
ALK PHOS: 173 U/L — AB (ref 38–126)
ALT: 34 U/L (ref 17–63)
AST: 65 U/L — ABNORMAL HIGH (ref 15–41)
Anion gap: 9 (ref 5–15)
BILIRUBIN TOTAL: 1.7 mg/dL — AB (ref 0.3–1.2)
BUN: 16 mg/dL (ref 6–20)
CALCIUM: 9.7 mg/dL (ref 8.9–10.3)
CO2: 24 mmol/L (ref 22–32)
Chloride: 107 mmol/L (ref 101–111)
Creatinine, Ser: 1.36 mg/dL — ABNORMAL HIGH (ref 0.61–1.24)
GFR calc Af Amer: 60 mL/min — ABNORMAL LOW (ref >60)
GFR, EST NON AFRICAN AMERICAN: 52 mL/min — AB (ref >60)
Glucose, Bld: 124 mg/dL — ABNORMAL HIGH (ref 65–99)
POTASSIUM: 4.2 mmol/L (ref 3.5–5.1)
SODIUM: 140 mmol/L (ref 135–145)
TOTAL PROTEIN: 7.2 g/dL (ref 6.5–8.1)

## 2015-12-17 LAB — TSH: TSH: 1.704 u[IU]/mL (ref 0.350–4.500)

## 2015-12-17 LAB — DIGOXIN LEVEL: Digoxin Level: <0.2 ng/mL — ABNORMAL LOW (ref 0.8–2.0)

## 2015-12-17 LAB — BRAIN NATRIURETIC PEPTIDE: B Natriuretic Peptide: 2460 pg/mL — ABNORMAL HIGH (ref 0.0–100.0)

## 2015-12-17 MED ORDER — TORSEMIDE 20 MG PO TABS: 80.0000 mg | ORAL_TABLET | Freq: Every day | ORAL | 3 refills | Status: DC

## 2015-12-17 MED ORDER — SPIRONOLACTONE 25 MG PO TABS
12.5000 mg | ORAL_TABLET | Freq: Every day | ORAL | 3 refills | Status: DC
Start: 2015-12-17 — End: 2016-03-23

## 2015-12-17 NOTE — Telephone Encounter (Signed)
I received referral for patient to be enrolled into the HF [employer].  I have sent referral form via email to [name].  Mr. Dec was unable to give his current address (with friend) and has asked that [name] call at a later time to get his address.

## 2015-12-17 NOTE — Patient Instructions (Signed)
Stop Furosemide   Start Torsemide 80 mg (4 tabs) daily  Start Spironolactone 12.5 mg (1/2 tab) daily  Labs today  You have been referred to Dr Burr Medico at the [facility] have been referred to our [facility], Joellen Jersey is the paramedic, she will contact you to schedule an appointment.  Your physician recommends that you schedule a follow-up appointment in: 1 week

## 2015-12-18 NOTE — Progress Notes (Signed)
Cardiology/PCP: Dr. Terrence Dupont HF Cardiology: Dr. Aundra Dubin  70 yo with history of CML, CVA, HTN, and chronic systolic CHF presents for CHF clinic evaluation.  He has had a cardiomyopathy known for years.  Coronary angiography in 2008 showed mild CAD.  EF was 10-15% on echo in 6/16 and 20-25% on echo 7/17.  He is not currently being treated for CML.   He uses a cane to walk.  He is short of breath walking up a hill or up stairs.  +Orthopnea, +bendopnea.  Rarely he is lightheaded with standing.  No chest pain.  No palpitations.  He is taking all his meds.  +Cough.   ECG: NSR, poor RWP, inferior and lateral TWIs  Labs (8/17): digoxin < 0.2, BNP 3489, WBCs 23, HCT 35.1, plts 614, K 4.4, creatinine 1.31.   PMH: 1.  CML: Not currently being treated.  2.  Hyperlipidemia 3.  HTN 4.  CVA x 2. 5.  H/o ETOH abuse.  6.  Chronic systolic CHF: Nonischemic cardiomyopathy.  - LHC in 2008 with mild nonobstructive CAD.  - Echo (6/16): EF 10-15%. - Echo (7/17): EF 20-25%, diffuse hypokinesis, mild LV dilation, mild MR, moderate TR, PASP 58 mmHg.  7. Atrial tachycardia.   SH: Lives with friend in [place], widower, smokes 2-3 cigs/day, rare ETOH, no drugs.   FH: No known heart problems.    ROS: All systems reviewed and negative except as per HPI.   Current Outpatient Prescriptions  Medication Sig Dispense Refill  . acetaminophen (TYLENOL) 325 MG tablet Take 2 tablets (650 mg total) by mouth every 4 (four) hours as needed for headache or mild pain. 60 tablet 3  . albuterol (PROVENTIL HFA;VENTOLIN HFA) 108 (90 BASE) MCG/ACT inhaler Inhale 2 puffs into the lungs every 4 (four) hours as needed for wheezing or shortness of breath. 1 Inhaler 3  . ALPRAZolam (XANAX) 0.25 MG tablet Take 0.25 mg by mouth 2 (two) times daily as needed for anxiety.   0  . amiodarone (PACERONE) 200 MG tablet Take 1 tablet (200 mg total) by mouth daily. 30 tablet 3  . aspirin 81 MG chewable tablet Chew 1 tablet (81 mg total) by mouth  daily.    Marland Kitchen atorvastatin (LIPITOR) 40 MG tablet Take 40 mg by mouth daily.    . bisacodyl (DULCOLAX) 5 MG EC tablet Take 2 tablets (10 mg total) by mouth daily as needed for moderate constipation. 30 tablet 0  . carvedilol (COREG) 3.125 MG tablet Take 3.125 mg by mouth 2 (two) times daily.  0  . clopidogrel (PLAVIX) 75 MG tablet Take 1 tablet (75 mg total) by mouth daily. 30 tablet 3  . digoxin (LANOXIN) 0.125 MG tablet Take 1 tablet (0.125 mg total) by mouth daily. 30 tablet 1  . ENTRESTO 24-26 MG Take 1 tablet by mouth 2 (two) times daily.   0  . oxyCODONE-acetaminophen (PERCOCET/ROXICET) 5-325 MG tablet Take 2 tablets by mouth every 4 (four) hours as needed for severe pain. 3 tablet 0  . potassium chloride SA (K-DUR,KLOR-CON) 20 MEQ tablet Take 20 mEq by mouth daily. Reported on 10/03/2015  0  . ranitidine (ZANTAC) 150 MG capsule Take 1 capsule (150 mg total) by mouth daily. 30 capsule 0  . spironolactone (ALDACTONE) 25 MG tablet Take 0.5 tablets (12.5 mg total) by mouth daily. 15 tablet 3  . torsemide (DEMADEX) 20 MG tablet Take 4 tablets (80 mg total) by mouth daily. 120 tablet 3   No current facility-administered medications for this  encounter.    BP 110/62   Pulse 63   Wt 166 lb 8 oz (75.5 kg)   SpO2 99%   BMI 25.32 kg/m  General: NAD Neck: JVP 12-14 cm, no thyromegaly or thyroid nodule.  Lungs: Clear to auscultation bilaterally with normal respiratory effort. CV: Nondisplaced PMI.  Heart regular S1/S2, no S3/S4, no murmur.  Trace ankle edema.  No carotid bruit.  Normal pedal pulses.  Abdomen: Soft, nontender, no hepatosplenomegaly, no distention.  Skin: Intact without lesions or rashes.  Neurologic: Alert and oriented x 3.  Psych: Normal affect. Extremities: No clubbing or cyanosis.  HEENT: Normal.   Assessment/Plan: 1. Chronic systolic CHF: Suspect nonischemic cardiomyopathy.  Possible prior viral myocarditis.  Cath in 2008 not suggestive of significant coronary disease.  He  is volume overloaded on exam with NYHA class III symptoms.   - Stop Lasix, start torsemide 80 mg once daily.  BMET/BNP today and repeat in 1 week.  - Add spironolactone 12.5 daily.  - Continue current Coreg and Entresto.   - Continue digoxin, check level today.  - Cardiac MRI to look for infiltrative disease.  - Will need to consider for ICD unless LV function improves with aggressive medical treatment.  Not CRT candidate with narrow QRS.  2. CML: WBCs and plts high.  He is not being treated currently.  Refer back to Dr Burr Medico.  3. CVA: Continue Plavix, statin, ASA 81. Last CVA was 3 years ago.  Cryptogenic, no workup for atrial fibrillation and no LV thrombus visualized.   Followup in 1 week.   Loralie Champagne 12/18/2015

## 2015-12-24 ENCOUNTER — Ambulatory Visit (HOSPITAL_COMMUNITY)
Admission: RE | Admit: 2015-12-24 | Discharge: 2015-12-24 | Disposition: A | Discharge status: Home / Self Care | Payer: Medicare Other | Source: Ambulatory Visit | Attending: Internal Medicine | Admitting: Internal Medicine

## 2015-12-24 VITALS — BP 134/68 | HR 83 | Wt 160.8 lb

## 2015-12-24 DIAGNOSIS — Z856 Personal history of leukemia: Secondary | ICD-10-CM | POA: Insufficient documentation

## 2015-12-24 DIAGNOSIS — F17290 Nicotine dependence, other tobacco product, uncomplicated: Secondary | ICD-10-CM | POA: Insufficient documentation

## 2015-12-24 DIAGNOSIS — Z7982 Long term (current) use of aspirin: Secondary | ICD-10-CM | POA: Insufficient documentation

## 2015-12-24 DIAGNOSIS — Z8673 Personal history of transient ischemic attack (TIA), and cerebral infarction without residual deficits: Secondary | ICD-10-CM | POA: Diagnosis not present

## 2015-12-24 DIAGNOSIS — F1010 Alcohol abuse, uncomplicated: Secondary | ICD-10-CM | POA: Insufficient documentation

## 2015-12-24 DIAGNOSIS — E785 Hyperlipidemia, unspecified: Secondary | ICD-10-CM | POA: Insufficient documentation

## 2015-12-24 DIAGNOSIS — I429 Cardiomyopathy, unspecified: Secondary | ICD-10-CM | POA: Diagnosis not present

## 2015-12-24 DIAGNOSIS — I5023 Acute on chronic systolic (congestive) heart failure: Secondary | ICD-10-CM | POA: Diagnosis not present

## 2015-12-24 DIAGNOSIS — I2510 Atherosclerotic heart disease of native coronary artery without angina pectoris: Secondary | ICD-10-CM | POA: Diagnosis not present

## 2015-12-24 DIAGNOSIS — I471 Supraventricular tachycardia: Secondary | ICD-10-CM | POA: Diagnosis not present

## 2015-12-24 DIAGNOSIS — F17200 Nicotine dependence, unspecified, uncomplicated: Secondary | ICD-10-CM

## 2015-12-24 DIAGNOSIS — Z720 Tobacco use: Secondary | ICD-10-CM | POA: Diagnosis not present

## 2015-12-24 DIAGNOSIS — I110 Hypertensive heart disease with heart failure: Secondary | ICD-10-CM | POA: Diagnosis not present

## 2015-12-24 DIAGNOSIS — C9210 Chronic myeloid leukemia, BCR/ABL-positive, not having achieved remission: Secondary | ICD-10-CM

## 2015-12-24 LAB — BASIC METABOLIC PANEL
ANION GAP: 9 (ref 5–15)
BUN: 11 mg/dL (ref 6–20)
CALCIUM: 9.8 mg/dL (ref 8.9–10.3)
CHLORIDE: 108 mmol/L (ref 101–111)
CO2: 22 mmol/L (ref 22–32)
Creatinine, Ser: 1.29 mg/dL — ABNORMAL HIGH (ref 0.61–1.24)
GFR calc non Af Amer: 55 mL/min — ABNORMAL LOW (ref >60)
Glucose, Bld: 110 mg/dL — ABNORMAL HIGH (ref 65–99)
Potassium: 3.9 mmol/L (ref 3.5–5.1)
SODIUM: 139 mmol/L (ref 135–145)

## 2015-12-24 LAB — BRAIN NATRIURETIC PEPTIDE: B NATRIURETIC PEPTIDE 5: 3884.4 pg/mL — AB (ref 0.0–100.0)

## 2015-12-24 MED ORDER — TORSEMIDE 20 MG PO TABS: 40.0000 mg | ORAL_TABLET | Freq: Every day | ORAL | 3 refills | Status: DC

## 2015-12-24 NOTE — Progress Notes (Signed)
Advanced Heart Failure Medication Review by a Pharmacist  Does the patient  feel that his/her medications are working for him/her?  yes  Has the patient been experiencing any side effects to the medications prescribed?  no  Does the patient measure his/her own blood pressure or blood glucose at home?  no   Does the patient have any problems obtaining medications due to transportation or finances?   no  Understanding of regimen: fair Understanding of indications: fair Potential of compliance: fair Patient understands to avoid NSAIDs. Patient understands to avoid decongestants.  Issues to address at subsequent visits: Compliance   Pharmacist comments:  Bruce [name] is a 70 yo M presenting without a medication list and with poor understanding of his regimen. I have called his [employer] and they will fax over a list.   [name], PharmD, BCPS, CPP Clinical Pharmacist Pager: [phone] Phone: [phone] 12/24/2015 12:49 PM      Time with patient: 10 minutes Preparation and documentation time: 10 minutes Total time: 20 minutes

## 2015-12-24 NOTE — Progress Notes (Signed)
Cardiology/PCP: Dr. Terrence Dupont HF Cardiology: Dr. Aundra Dubin  70 yo with history of CML, CVA, HTN, and chronic systolic CHF presents for CHF clinic evaluation.  He has had a cardiomyopathy known for years.  Coronary angiography in 2008 showed mild CAD.  EF was 10-15% on echo in 6/16 and 20-25% on echo 7/17.  He is not currently being treated for CML.   Today he returns for HF follow up. Last visit 12.5 mg spiro added and lasix was sttopped-->torsemide started. He is not sure which medications he is taking.  SOB with exertion. Denies PND. Sleeps sitting up in a chair. Weight at home 163 pounds. Smoking 2 cigarettes a day. Drinks a beer a week. Ambulates with a cane.   Labs (8/17): digoxin < 0.2, BNP 3489, WBCs 23, HCT 35.1, plts 614, K 4.4, creatinine 1.31.  Labs (12/17/2015): K 4.2 Creatinine 1.36   PMH: 1.  CML: Not currently being treated.  2.  Hyperlipidemia 3.  HTN 4.  CVA x 2. 5.  H/o ETOH abuse.  6.  Chronic systolic CHF: Nonischemic cardiomyopathy.  - LHC in 2008 with mild nonobstructive CAD.  - Echo (6/16): EF 10-15%. - Echo (7/17): EF 20-25%, diffuse hypokinesis, mild LV dilation, mild MR, moderate TR, PASP 58 mmHg.  7. Atrial tachycardia.   SH: Lives with friend in [place], widower, smokes 2-3 cigs/day, rare ETOH, no drugs.   FH: No known heart problems.    ROS: All systems reviewed and negative except as per HPI.   Current Outpatient Prescriptions  Medication Sig Dispense Refill  . acetaminophen (TYLENOL) 325 MG tablet Take 2 tablets (650 mg total) by mouth every 4 (four) hours as needed for headache or mild pain. 60 tablet 3  . albuterol (PROVENTIL HFA;VENTOLIN HFA) 108 (90 BASE) MCG/ACT inhaler Inhale 2 puffs into the lungs every 4 (four) hours as needed for wheezing or shortness of breath. 1 Inhaler 3  . amiodarone (PACERONE) 200 MG tablet Take 1 tablet (200 mg total) by mouth daily. 30 tablet 3  . aspirin 81 MG chewable tablet Chew 1 tablet (81 mg total) by mouth daily.     Marland Kitchen atorvastatin (LIPITOR) 40 MG tablet Take 40 mg by mouth daily.    . bisacodyl (DULCOLAX) 5 MG EC tablet Take 2 tablets (10 mg total) by mouth daily as needed for moderate constipation. 30 tablet 0  . carvedilol (COREG) 3.125 MG tablet Take 3.125 mg by mouth 2 (two) times daily.  0  . clopidogrel (PLAVIX) 75 MG tablet Take 1 tablet (75 mg total) by mouth daily. 30 tablet 3  . digoxin (LANOXIN) 0.125 MG tablet Take 1 tablet (0.125 mg total) by mouth daily. 30 tablet 1  . ENTRESTO 24-26 MG Take 1 tablet by mouth 2 (two) times daily.   0  . oxyCODONE-acetaminophen (PERCOCET/ROXICET) 5-325 MG tablet Take 2 tablets by mouth every 4 (four) hours as needed for severe pain. 3 tablet 0  . potassium chloride SA (K-DUR,KLOR-CON) 20 MEQ tablet Take 20 mEq by mouth daily. Reported on 10/03/2015  0  . ranitidine (ZANTAC) 150 MG capsule Take 1 capsule (150 mg total) by mouth daily. 30 capsule 0  . spironolactone (ALDACTONE) 25 MG tablet Take 0.5 tablets (12.5 mg total) by mouth daily. 15 tablet 3  . torsemide (DEMADEX) 20 MG tablet Take 4 tablets (80 mg total) by mouth daily. 120 tablet 3   No current facility-administered medications for this encounter.    BP 134/68 (BP Location: Left Arm, Patient Position:  Sitting, Cuff Size: Normal)   Pulse 83   Wt 160 lb 12.8 oz (72.9 kg)   SpO2 98%   BMI 24.45 kg/m  General: NAD Neck: JVP to jaw . No thyromegaly or thyroid nodule.  Lungs: Clear to auscultation bilaterally with normal respiratory effort. CV: Nondisplaced PMI.  Heart regular S1/S2, no S3/S4, no murmur.  Trace ankle edema.  No carotid bruit.  Normal pedal pulses.  Abdomen: Soft, nontender, no hepatosplenomegaly, no distention.  Skin: Intact without lesions or rashes.  Neurologic: Alert and oriented x 3.  Psych: Normal affect. Extremities: No clubbing or cyanosis.  HEENT: Normal.   Assessment/Plan: 1. Acute/Chronic systolic CHF: Suspect nonischemic cardiomyopathy.  Possible prior viral  myocarditis.  Cath in 2008 not suggestive of significant coronary disease. ECHO 10/2015: EF 20-25%.  NYHA class III symptoms. Volume status elevated. Continue torsemide 80 mg in am and add 40 mg in pm. BMET/BNP today and repeat in 1 week.  - Continue spironolactone 12.5 daily.  - Continue current Coreg and Entresto.   - Continue digoxin, dig level <0.2 .  - Cardiac MRI to look for infiltrative disease.  - Will need to consider for ICD unless LV function improves with aggressive medical treatment.  Not CRT candidate with narrow QRS.  2. CML: WBCs and plts high.  He is not being treated currently.  Refer back to Dr Burr Medico.  3. CVA: Continue Plavix, statin, ASA 81. Last CVA was 3 years ago.  Cryptogenic, no workup for atrial fibrillation and no LV thrombus visualized.  4. Smoker- current . Instructed to stop smoking. He declines.   Refer to Paramedicine and HFSW. Needs goals of care. He also has difficulty with his living situation.   Follow up in 1 week. Check BMET and BNP today  Nayelis Bonito NP-C  12/24/2015

## 2015-12-24 NOTE — Patient Instructions (Signed)
INCREASE Torsemide to 80 mg (4 tabs) in the AM and 40 mg (2 tabs) in the PM  Your physician recommends that you schedule a follow-up appointment in: 1 week  Do the following things EVERYDAY: 1) Weigh yourself in the morning before breakfast. Write it down and keep it in a log. 2) Take your medicines as prescribed 3) Eat low salt foods-Limit salt (sodium) to 2000 mg per day.  4) Stay as active as you can everyday 5) Limit all fluids for the day to less than 2 liters

## 2015-12-24 NOTE — Progress Notes (Signed)
Paramedicine Multidisciplinary Team Update  Bruce [name] is enrolled in the [employer] Paramedicine Program through [facility].  The patient presents today in association with an [facility] Appointment. Patient is new to the program and has not been seen yet in the home environment.  Patient living/home environment and social support-- Patient states he is staying with a friend and has moved from place to place. Patient states he would like the paramedic [facility] follow up. Insurance/ Prescription Coverage-- Medicare and Medicaid  Does the patient have a scale and weigh each day? Paramedic will follow up with patient on the first visit.  Does the patient follow a low salt diet? TBD  Is patient compliant with medications? TBD  Does the patient have transportation for physician appointments? Patient states he drives himself to all appointments.  Does the patient contact the HF Clinic appropriately with worsening symptoms or weight increases? Paramedic will review on first home visit and continually review.  Do you have an Advanced Directive? Discussed at length. Patient will consider and return to meet with CSW.  Are there any identified obstacles / challenges for adherence to current treatment plan? Patient reports housing is an issue. Patient states he currently is staying with a friend and is looking for his own place. Patient reports he previously lived in [place] although sounds like he owes back pay/rent. Patient has limited income and would like to move into some sort of subsidized independent housing. CSW provided patient with a list of housing options and encouraged patient to return for assistance with completing applications. Raquel Sarna, LCSW [phone]

## 2015-12-25 ENCOUNTER — Other Ambulatory Visit (HOSPITAL_COMMUNITY): Payer: Self-pay | Admitting: Pharmacist

## 2015-12-25 MED ORDER — CARVEDILOL 3.125 MG PO TABS: 3.1250 mg | ORAL_TABLET | Freq: Two times a day (BID) | ORAL | 5 refills | Status: DC

## 2015-12-25 MED ORDER — ATORVASTATIN CALCIUM 40 MG PO TABS
40.0000 mg | ORAL_TABLET | Freq: Every day | ORAL | 5 refills | Status: DC
Start: 2015-12-25 — End: 2016-08-04

## 2015-12-25 MED ORDER — ENTRESTO 24-26 MG PO TABS: 1.0000 | ORAL_TABLET | Freq: Two times a day (BID) | ORAL | 5 refills | Status: DC

## 2015-12-29 NOTE — Progress Notes (Signed)
Advanced Heart Failure Clinic Note   Cardiology/PCP: Dr. Terrence Dupont HF Cardiology: Dr. Aundra Dubin  70 yo with history of CML, CVA, HTN, and chronic systolic CHF presents for CHF clinic evaluation.  He has had a cardiomyopathy known for years.  Coronary angiography in 2008 showed mild CAD.  EF was 10-15% on echo in 6/16 and 20-25% on echo 7/17.  He is not currently being treated for CML.   He presents today for 1 week follow up.  At last visit torsemide evening dose added, though patient says he has not done.  Can't walk "too far" because he gets SOB. + Bendopnea. Sleeps sitting up. + Orthopnea. Not weighing at home. Says the scales were different than ours here, so hasn't been using.  Smokes a 2-3 cigarettes most days. Occasionally has a beer. Ambulates with a cane.   Labs (8/17): digoxin < 0.2, BNP 3489, WBCs 23, HCT 35.1, plts 614, K 4.4, creatinine 1.31.  Labs (12/17/2015): K 4.2 Creatinine 1.36   PMH: 1.  CML: Not currently being treated.  2.  Hyperlipidemia 3.  HTN 4.  CVA x 2. 5.  H/o ETOH abuse.  6.  Chronic systolic CHF: Nonischemic cardiomyopathy.  - LHC in 2008 with mild nonobstructive CAD.  - Echo (6/16): EF 10-15%. - Echo (7/17): EF 20-25%, diffuse hypokinesis, mild LV dilation, mild MR, moderate TR, PASP 58 mmHg.  7. Atrial tachycardia.   SH: Lives with friend in [place], widower, smokes 2-3 cigs/day, rare ETOH, no drugs.   FH: No known heart problems.    ROS: All systems reviewed and negative except as per HPI.   Current Outpatient Prescriptions  Medication Sig Dispense Refill  . acetaminophen (TYLENOL) 325 MG tablet Take 2 tablets (650 mg total) by mouth every 4 (four) hours as needed for headache or mild pain. 60 tablet 3  . albuterol (PROVENTIL HFA;VENTOLIN HFA) 108 (90 BASE) MCG/ACT inhaler Inhale 2 puffs into the lungs every 4 (four) hours as needed for wheezing or shortness of breath. 1 Inhaler 3  . ALPRAZolam (XANAX) 0.25 MG tablet Take 0.25 mg by mouth 2 (two)  times daily as needed for anxiety.    Marland Kitchen aspirin 81 MG chewable tablet Chew 1 tablet (81 mg total) by mouth daily.    Marland Kitchen atorvastatin (LIPITOR) 40 MG tablet Take 1 tablet (40 mg total) by mouth daily. 30 tablet 5  . carvedilol (COREG) 3.125 MG tablet Take 1 tablet (3.125 mg total) by mouth 2 (two) times daily. 60 tablet 5  . clopidogrel (PLAVIX) 75 MG tablet Take 1 tablet (75 mg total) by mouth daily. 30 tablet 3  . digoxin (LANOXIN) 0.125 MG tablet Take 1 tablet (0.125 mg total) by mouth daily. 30 tablet 1  . spironolactone (ALDACTONE) 25 MG tablet Take 0.5 tablets (12.5 mg total) by mouth daily. 15 tablet 3  . torsemide (DEMADEX) 20 MG tablet Take 80 mg by mouth daily.    Marland Kitchen amiodarone (PACERONE) 200 MG tablet Take 1 tablet (200 mg total) by mouth daily. (Patient not taking: Reported on 12/30/2015) 30 tablet 3   No current facility-administered medications for this encounter.    BP 140/72 (BP Location: Left Arm, Patient Position: Sitting, Cuff Size: Normal)   Pulse 82   Wt 157 lb 9.6 oz (71.5 kg)   SpO2 96%   BMI 23.96 kg/m    Wt Readings from Last 3 Encounters:  12/30/15 157 lb 9.6 oz (71.5 kg)  12/24/15 160 lb 12.8 oz (72.9 kg)  12/17/15 166  lb 8 oz (75.5 kg)    General: NAD Neck: JVP 9-10 cm. No thyromegaly or thyroid nodule.  Lungs: CTAB, normal effort CV: Nondisplaced PMI.  Heart regular S1/S2, no S3/S4, no murmur.  Trace ankle edema.  No carotid bruit.  Normal pedal pulses.  Abdomen: Soft, NT, ND, no HSM. No bruits or masses. +BS  Skin: Intact without lesions or rashes.  Neurologic: Alert and oriented x 3.  Psych: Normal affect. Extremities: No clubbing or cyanosis.  HEENT: Normal.   Assessment/Plan: 1. Chronic systolic CHF: Suspect nonischemic cardiomyopathy.  Possible prior viral myocarditis.  Cath in 2008 not suggestive of significant coronary disease. ECHO 10/2015: EF 20-25%.  NYHA class III symptoms.  - Volume status remains mildly elevated, though improved from last  visit.  - Continue torsemide 80 mg daily. BMET/BNP today. - Increase entresto to 49/51 mg BID.  - Continue spironolactone 12.5 daily.  - Continue current Coreg 3.125 mg BID.   - Continue digoxin, dig level <0.2 .  - Cardiac MRI to look for infiltrative disease.  - Will need to consider for ICD unless LV function improves with aggressive medical treatment.  Not CRT candidate with narrow QRS.  2. CML: WBCs and plts high.  He is not being treated currently.   - Needs to go back Dr Burr Medico. Referred last week but has not heard back from.  3. CVA: Continue Plavix, statin, ASA 81. Last CVA was 3 years ago.  Cryptogenic, no workup for atrial fibrillation and no LV thrombus visualized.  4. Smoker- current . Instructed to stop smoking. He declines.   Starting Paramedicine now. Has poor living situation. Pt is a poor historian, has trouble remembers his medications, and has very poor insight into his disease.    Follow up in 2 weeks. Recheck BMET/BNP today.   Shirley Friar, PA-C 12/30/2015  Total time spent > 25 minutes, over half that spent discussing the above.

## 2015-12-30 ENCOUNTER — Other Ambulatory Visit (HOSPITAL_COMMUNITY): Payer: Self-pay | Admitting: Pharmacist

## 2015-12-30 ENCOUNTER — Ambulatory Visit (HOSPITAL_COMMUNITY)
Admission: RE | Admit: 2015-12-30 | Discharge: 2015-12-30 | Disposition: A | Discharge status: Home / Self Care | Payer: Medicare Other | Source: Ambulatory Visit | Attending: Cardiology | Admitting: Cardiology

## 2015-12-30 VITALS — BP 140/72 | HR 82 | Wt 157.6 lb

## 2015-12-30 DIAGNOSIS — N182 Chronic kidney disease, stage 2 (mild): Secondary | ICD-10-CM

## 2015-12-30 DIAGNOSIS — I5022 Chronic systolic (congestive) heart failure: Secondary | ICD-10-CM | POA: Diagnosis present

## 2015-12-30 DIAGNOSIS — I5043 Acute on chronic combined systolic (congestive) and diastolic (congestive) heart failure: Secondary | ICD-10-CM | POA: Diagnosis not present

## 2015-12-30 DIAGNOSIS — Z7982 Long term (current) use of aspirin: Secondary | ICD-10-CM | POA: Insufficient documentation

## 2015-12-30 DIAGNOSIS — F17210 Nicotine dependence, cigarettes, uncomplicated: Secondary | ICD-10-CM | POA: Insufficient documentation

## 2015-12-30 DIAGNOSIS — I255 Ischemic cardiomyopathy: Secondary | ICD-10-CM | POA: Diagnosis not present

## 2015-12-30 DIAGNOSIS — Z8673 Personal history of transient ischemic attack (TIA), and cerebral infarction without residual deficits: Secondary | ICD-10-CM | POA: Insufficient documentation

## 2015-12-30 DIAGNOSIS — C9210 Chronic myeloid leukemia, BCR/ABL-positive, not having achieved remission: Secondary | ICD-10-CM | POA: Diagnosis not present

## 2015-12-30 DIAGNOSIS — I10 Essential (primary) hypertension: Secondary | ICD-10-CM | POA: Diagnosis not present

## 2015-12-30 DIAGNOSIS — E785 Hyperlipidemia, unspecified: Secondary | ICD-10-CM | POA: Insufficient documentation

## 2015-12-30 DIAGNOSIS — I110 Hypertensive heart disease with heart failure: Secondary | ICD-10-CM | POA: Insufficient documentation

## 2015-12-30 LAB — BASIC METABOLIC PANEL
ANION GAP: 9 (ref 5–15)
BUN: 17 mg/dL (ref 6–20)
CALCIUM: 9.5 mg/dL (ref 8.9–10.3)
CO2: 23 mmol/L (ref 22–32)
CREATININE: 1.44 mg/dL — AB (ref 0.61–1.24)
Chloride: 106 mmol/L (ref 101–111)
GFR, EST AFRICAN AMERICAN: 56 mL/min — AB (ref >60)
GFR, EST NON AFRICAN AMERICAN: 48 mL/min — AB (ref >60)
GLUCOSE: 134 mg/dL — AB (ref 65–99)
Potassium: 3.3 mmol/L — ABNORMAL LOW (ref 3.5–5.1)
Sodium: 138 mmol/L (ref 135–145)

## 2015-12-30 LAB — BRAIN NATRIURETIC PEPTIDE: B Natriuretic Peptide: 3310.3 pg/mL — ABNORMAL HIGH (ref 0.0–100.0)

## 2015-12-30 MED ORDER — AMIODARONE HCL 200 MG PO TABS: 200.0000 mg | ORAL_TABLET | Freq: Every day | ORAL | 5 refills | Status: AC

## 2015-12-30 MED ORDER — SACUBITRIL-VALSARTAN 49-51 MG PO TABS: 1.0000 | ORAL_TABLET | Freq: Two times a day (BID) | ORAL | 6 refills | Status: DC

## 2015-12-30 NOTE — Patient Instructions (Signed)
INCREASE Entresto to 49/51 mg, one tab twice a day  Labs today  Your physician recommends that you schedule a follow-up appointment in: 2 weeks with Oda Kilts, PA and 4 weeks with Dr Aundra Dubin  Do the following things EVERYDAY: 1) Weigh yourself in the morning before breakfast. Write it down and keep it in a log. 2) Take your medicines as prescribed 3) Eat low salt foods-Limit salt (sodium) to 2000 mg per day.  4) Stay as active as you can everyday 5) Limit all fluids for the day to less than 2 liters

## 2015-12-30 NOTE — Progress Notes (Signed)
Advanced Heart Failure Medication Review by a Pharmacist  Does the patient  feel that his/her medications are working for him/her?  yes  Has the patient been experiencing any side effects to the medications prescribed?  no  Does the patient measure his/her own blood pressure or blood glucose at home?  no   Does the patient have any problems obtaining medications due to transportation or finances?   no  Understanding of regimen: poor Understanding of indications: poor Potential of compliance: poor Patient understands to avoid NSAIDs. Patient understands to avoid decongestants.  Issues to address at subsequent visits: Compliance   Pharmacist comments:  [name] is a pleasant 70 yo M presenting with some medication bottles (most of which are out of date). He admits to missing 2-3 days of his medications. I did verify with [facility] that he filled digoxin last month and atorvastatin was filled on 9/17. He has not filled KCl since May. [name] Journalist, newspaper) will be seeing [name] and helping him with his medications starting today. I have provided him with a medication bag, pillbox and pill cutter and asked that he bring his medication bottles with him to each appointment.   [name], PharmD, BCPS, CPP Clinical Pharmacist Pager: [phone] Phone: [phone] 12/30/2015 10:23 AM    Time with patient: 10 minutes Preparation and documentation time: 10 minutes  Total time: 20 minutes

## 2016-01-07 ENCOUNTER — Encounter (HOSPITAL_COMMUNITY): Payer: Self-pay | Admitting: *Deleted

## 2016-01-07 ENCOUNTER — Telehealth (HOSPITAL_COMMUNITY): Payer: Self-pay | Admitting: *Deleted

## 2016-01-07 NOTE — Telephone Encounter (Signed)
Notes Recorded by Harvie Junior, CMA on 01/07/2016 at 1:45 PM EDT Unable to reach patient. Letter mailed ------  Notes Recorded by Scarlette Calico, RN on 01/02/2016 at 4:30 PM EDT Unable to reach patient.  ------  Notes Recorded by Harvie Junior, [place] on 01/01/2016 at 4:21 PM EDT Unable to reach patient. Attempted to reach patient by phone. No answer/ No voicemail   ------  Notes Recorded by Larey Dresser, MD on 01/01/2016 at 12:26 AM EDT Needs to take an additional KCl 20 mEq daily. BMET 10 days.    Ref Range & Units 8d ago 2wk ago 3wk ago   Sodium 135 - 145 mmol/L 138  139  140    Potassium 3.5 - 5.1 mmol/L 3.3   3.9  4.2    Chloride 101 - 111 mmol/L 106  108  107    CO2 22 - 32 mmol/L 23  22  24     Glucose, Bld 65 - 99 mg/dL 134   110   124     BUN 6 - 20 mg/dL 17  11  16     Creatinine, Ser 0.61 - 1.24 mg/dL 1.44   1.29   1.36     Calcium 8.9 - 10.3 mg/dL 9.5  9.8  9.7    GFR calc non Af Amer >60 mL/min 48   55   52     GFR calc Af Amer >60 mL/min 56   >60CM  60CM

## 2016-01-13 ENCOUNTER — Inpatient Hospital Stay (HOSPITAL_COMMUNITY): Admission: RE | Admit: 2016-01-13 | Payer: Medicare Other | Source: Ambulatory Visit

## 2016-01-29 ENCOUNTER — Inpatient Hospital Stay (HOSPITAL_COMMUNITY): Admission: RE | Admit: 2016-01-29 | Payer: Medicare Other | Source: Ambulatory Visit

## 2016-02-04 ENCOUNTER — Encounter: Payer: Self-pay | Admitting: Licensed Clinical Social Worker

## 2016-02-04 NOTE — Progress Notes (Signed)
Patient came to clinic seeking assistance with housing paperwork. CSW assisted patient with completion of forms. Patient also requested assistance with obtaining a PCP. CSW referred patient to [facility] and will be seen on March 03, 2016 at 1:45pm.Patient grateful for assistance. CSW available as needed. Raquel Sarna, LCSW [phone]

## 2016-03-01 ENCOUNTER — Encounter: Payer: Self-pay | Admitting: Licensed Clinical Social Worker

## 2016-03-01 NOTE — Progress Notes (Signed)
Patient came by [place] office today to ask for assistance with housing. Patient states he has tried multiple options with no success. CSW assisted with list of available housing in [place] from [employer]. Patient will follow up with list provided and return with applications for further assistance. CSW will continue to follow for support and assistance with housing as needed. Raquel Sarna, LCSW [phone]

## 2016-03-03 ENCOUNTER — Encounter: Payer: Medicare Other | Admitting: Internal Medicine

## 2016-03-08 NOTE — Progress Notes (Deleted)
CC: ***  HPI:  Bruce [name] is a 70 y.o. male with a past medical history listed below here today to establish care.   CAD -  Followed by Dr. Terrence Dupont. Coronary angiography in 2008 showed mild CAD. Currently on Sacubitril/Valastartan 24/26 mg bid, ASA 81 mg daily, Clopidogrel 75 mg daily and Atorvastatin 40 mg daily.   Hx of CVA (x2) - No residual deficits. Currently on Sacubitril/Valastartan 24/26 mg bid, ASA 81 mg daily, Clopidogrel 75 mg daily and Atorvastatin 40 mg daily. Last CVA was in 2014.   HTN - BP *** today. Currently on Sacubitril/Valastartan 24/26 mg bid, Carvedilol 3.125 mg bid, Spironolactone 12.5 mg daily and Demadex 80 mg daily. BMET today.  HFrEF - Long history of known cardiomyopathy. Established with Dr. Aundra Dubin at the HF clinic 12/17/2014. EF was 10-15% on echo in 09/2014.  Most recent ECHO done 10/2015 showed an EF of 20-25% with diffuse hypokinesis. Mild LV dilation, mild MR, moderate TR. PA pressure elevated at 58 mm Hg.  Suspected non-ischemic cardiomyopathy with Cath in 2008 without significant coronary disease. Currently on Sacubitril/Valastartan 49/51 mg bid and Carvedilol 3.125 mg bid. Lasix was stopped at September visit with Dr. Aundra Dubin and he was started on Demadex 80 mg daily and Spironolactone 12.5 mg daily.  Dr. Aundra Dubin wanted to check a cardiac MRI to assess for infiltrative disease at last visit. Does not appear that this has been done. No discussion about ICD placement has taken place yet. Has not followed up with HF clinic since September.  Unclear dry weight but weight at last OP visit was 157 lbs. Weight today is ***. Patient does not weight himself at home.***  ***He uses a cane to walk.  He is short of breath walking up a hill or up stairs.  +Orthopnea, +bendopnea.  Rarely he is lightheaded with standing.  No chest pain.  No palpitations.  He is taking all his meds.  +Cough.***  Refer back to HF clinic. Check BMET and BNP today.   HLD - Patient  currently taking Atorvastatin 80 mg daily. Lipid panel. CMP.   PAF - On amiodarone 200 mg daily and Digoxin 0.125 mg daily.   CKD Stage II - Baseline Cr appears to be around 1.3-1.4. BMET  Chronic Myeloid Leukemia - Last Hem/Onc notes I see are from [place] [facility] hem[facility]on[facility] in December 2016. At that time he was suppose to be taking bosutinib 300 mg daily. Has since been lost to follow up. Appears he was referred to Dr. Burr Medico but has missed several appointments. Does not appear to be on any CML therapies currently.*** REFER BACK TO ONCOLOGY  Smoker - Smokes 2-3 cigarettes/day***. *** Pack year history.   Hx of EOTH abuse - *** drinking /day  Noncompliance - Patient has a history of poor compliance. Previous notes indicate that he has admitted to missing doses, not filling his medications and general confusion about his medications. Appears that HF clinic has initiated paramedicine (paramedics that visit the patient and check up on him) to help with his medications. HF clinic's pharmacist also provided him with a medication bag, pillbox and pill cutter. Social work. Dr. Maudie Mercury.    Past Medical History:  Diagnosis Date  . Bell's palsy   . CKD (chronic kidney disease), stage II   . CML (chronic myelocytic leukemia) ([place])   . Coronary artery disease   . Hypercholesterolemia   . Hypertension   . Leukemia ([place])   . Stroke [facility])  No residual limb weakness.  Walks with cane at baseline.   Marland Kitchen TIA (transient ischemic attack) 05/10/2014    Past Surgical History:  Procedure Laterality Date  . BACK SURGERY    . HIP ARTHROPLASTY Right    orif  . ORIF FOREARM FRACTURE Right    Social History   Social History  . Marital status: Widowed    Spouse name: N/A  . Number of children: 0  . Years of education: N/A   Occupational History  . retired    Social History Main Topics  . Smoking status: Former Smoker    Packs/day: 0.50    Years: 50.00    Types: Cigarettes  . Smokeless tobacco:  Never Used  . Alcohol use 0.6 oz/week    1 Cans of beer per week     Comment: daily   . Drug use: No  . Sexual activity: No   Other Topics Concern  . Not on file   Social History Narrative   Patient is right handed.   Patient drinks 1-2 cups daily.    Review of Systems:  ***  Physical Exam:  There were no vitals filed for this visit. ***  Assessment & Plan:   See Encounters Tab for problem based charting.  Patient {GC/GE:3044014::"discussed with","seen with"} Dr. {NAMES:3044014::"Butcher","Granfortuna","E. Hoffman","Klima","Mullen","Narendra","Vincent"}

## 2016-03-09 ENCOUNTER — Telehealth: Payer: Self-pay | Admitting: Cardiology

## 2016-03-09 NOTE — Telephone Encounter (Signed)
APT. REMINDER CALL, NO ANSWER, NO VOICEMAIL °

## 2016-03-10 ENCOUNTER — Encounter: Payer: Medicare Other | Admitting: Internal Medicine

## 2016-03-15 ENCOUNTER — Encounter (HOSPITAL_COMMUNITY): Payer: Self-pay

## 2016-03-15 ENCOUNTER — Emergency Department (HOSPITAL_COMMUNITY)
Admission: EM | Admit: 2016-03-15 | Discharge: 2016-03-15 | Disposition: A | Discharge status: Home / Self Care | Payer: Medicare Other | Attending: Emergency Medicine | Admitting: Emergency Medicine

## 2016-03-15 DIAGNOSIS — Z87891 Personal history of nicotine dependence: Secondary | ICD-10-CM | POA: Diagnosis not present

## 2016-03-15 DIAGNOSIS — K429 Umbilical hernia without obstruction or gangrene: Secondary | ICD-10-CM | POA: Insufficient documentation

## 2016-03-15 DIAGNOSIS — Z79899 Other long term (current) drug therapy: Secondary | ICD-10-CM | POA: Diagnosis not present

## 2016-03-15 DIAGNOSIS — N182 Chronic kidney disease, stage 2 (mild): Secondary | ICD-10-CM | POA: Diagnosis not present

## 2016-03-15 DIAGNOSIS — I5022 Chronic systolic (congestive) heart failure: Secondary | ICD-10-CM | POA: Insufficient documentation

## 2016-03-15 DIAGNOSIS — I130 Hypertensive heart and chronic kidney disease with heart failure and stage 1 through stage 4 chronic kidney disease, or unspecified chronic kidney disease: Secondary | ICD-10-CM | POA: Insufficient documentation

## 2016-03-15 DIAGNOSIS — I2510 Atherosclerotic heart disease of native coronary artery without angina pectoris: Secondary | ICD-10-CM | POA: Diagnosis not present

## 2016-03-15 DIAGNOSIS — Z8673 Personal history of transient ischemic attack (TIA), and cerebral infarction without residual deficits: Secondary | ICD-10-CM | POA: Insufficient documentation

## 2016-03-15 DIAGNOSIS — Z96641 Presence of right artificial hip joint: Secondary | ICD-10-CM | POA: Insufficient documentation

## 2016-03-15 DIAGNOSIS — Z7982 Long term (current) use of aspirin: Secondary | ICD-10-CM | POA: Diagnosis not present

## 2016-03-15 DIAGNOSIS — R109 Unspecified abdominal pain: Secondary | ICD-10-CM | POA: Diagnosis present

## 2016-03-15 DIAGNOSIS — G510 Bell's palsy: Secondary | ICD-10-CM | POA: Diagnosis not present

## 2016-03-15 NOTE — ED Notes (Signed)
Paper became very upset upon discharge.  He stated that MD did not treat his condition.  He stated he would bring a lawsuit against the hospital because we did not treat his sickness.  Patient refused to allow me to get MD to speak with him.  He became very upset/agiated and was escorted out by security.  Patient started using foul language and racial slurs.  He did not sign discharge papers or allow vital signs to be obtained.

## 2016-03-15 NOTE — ED Triage Notes (Signed)
Pt presents with c/o palpitations and abdominal hernia. Pt reports that he has been feeling the palpitations "for a while" and that they are recurrent. Pt reports that he has also had some near syncopal episodes. Pt is alert and oriented, able to answer all questions, no acute distress.

## 2016-03-15 NOTE — ED Provider Notes (Signed)
[facility] DEPT Provider Note   CSN: [id number] Arrival date & time: 03/15/16  1029     History   Chief Complaint Chief Complaint  Patient presents with  . Palpitations  . Hernia    HPI Bruce [name] is a 70 y.o. male.  70 yo M with a chief complaint of abdominal pain. Patient states that he has a hernia there that is been bothering him off and on. Feels like he has enrolled in the area. Feels that is worse than normal. Denies vomiting denies fevers denies diarrhea. Denies injury.   The history is provided by the patient.  Illness  This is a new problem. The current episode started less than 1 hour ago. The problem occurs constantly. The problem has not changed since onset.Associated symptoms include abdominal pain. Pertinent negatives include no chest pain, no headaches and no shortness of breath. Nothing aggravates the symptoms. Nothing relieves the symptoms. He has tried nothing for the symptoms. The treatment provided no relief.    Past Medical History:  Diagnosis Date  . Bell's palsy   . CKD (chronic kidney disease), stage II   . CML (chronic myelocytic leukemia) ([place])   . Coronary artery disease   . Hypercholesterolemia   . Hypertension   . Leukemia ([place])   . Stroke ([place])    No residual limb weakness.  Walks with cane at baseline.   Marland Kitchen TIA (transient ischemic attack) 05/10/2014    Patient Active Problem List   Diagnosis Date Noted  . Chronic systolic heart failure ([place]) 12/30/2015  . Elevated lactic acid level 10/11/2015  . CKD (chronic kidney disease), stage II   . Dizziness 01/25/2015  . Essential hypertension 01/25/2015  . Compliance poor 01/25/2015  . Coronary artery disease   . H/O: stroke with residual effects 09/13/2014  . Transaminitis 09/13/2014  . Elevated troponin 09/13/2014  . TIA (transient ischemic attack) 05/10/2014  . CML (chronic myelocytic leukemia) ([place]) 03/27/2014  . Leukocytosis 03/18/2014  . Chest pain 03/05/2014    Past  Surgical History:  Procedure Laterality Date  . BACK SURGERY    . HIP ARTHROPLASTY Right    orif  . ORIF FOREARM FRACTURE Right        Home Medications    Prior to Admission medications   Medication Sig Start Date End Date Taking? Authorizing Provider  acetaminophen (TYLENOL) 325 MG tablet Take 2 tablets (650 mg total) by mouth every 4 (four) hours as needed for headache or mild pain. 06/06/15   [name], MD  albuterol (PROVENTIL HFA;VENTOLIN HFA) 108 (90 BASE) MCG/ACT inhaler Inhale 2 puffs into the lungs every 4 (four) hours as needed for wheezing or shortness of breath. 08/08/14   Noemi Chapel, MD  ALPRAZolam Duanne Moron) 0.25 MG tablet Take 0.25 mg by mouth 2 (two) times daily as needed for anxiety.    Historical Provider, MD  amiodarone (PACERONE) 200 MG tablet Take 1 tablet (200 mg total) by mouth daily. 12/30/15   Shirley Friar, PA-C  aspirin 81 MG chewable tablet Chew 1 tablet (81 mg total) by mouth daily. 09/22/14   Hosie Poisson, MD  atorvastatin (LIPITOR) 40 MG tablet Take 1 tablet (40 mg total) by mouth daily. 12/25/15   Amy D Clegg, NP  carvedilol (COREG) 3.125 MG tablet Take 1 tablet (3.125 mg total) by mouth 2 (two) times daily. 12/25/15   Amy D Ninfa Meeker, NP  clopidogrel (PLAVIX) 75 MG tablet Take 1 tablet (75 mg total) by mouth daily. 03/21/14  [name], MD  digoxin (LANOXIN) 0.125 MG tablet Take 1 tablet (0.125 mg total) by mouth daily. 01/29/15   Barton Dubois, MD  sacubitril-valsartan (ENTRESTO) 49-51 MG Take 1 tablet by mouth 2 (two) times daily. 12/30/15   Shirley Friar, PA-C  spironolactone (ALDACTONE) 25 MG tablet Take 0.5 tablets (12.5 mg total) by mouth daily. 12/17/15 03/16/16  Larey Dresser, MD  torsemide (DEMADEX) 20 MG tablet Take 80 mg by mouth daily.    Historical Provider, MD    Family History Family History  Problem Relation Age of Onset  . Diabetes Mother   . Hypertension Mother   . Diabetes Father   . Hypertension Father   . Diabetes  Brother   . Hypertension Brother   . Diabetes Sister   . Hypertension Sister   . Diabetes Brother   . Hypertension Brother   . Diabetes Sister   . Hypertension Sister     Social History Social History  Substance Use Topics  . Smoking status: Former Smoker    Packs/day: 0.50    Years: 50.00    Types: Cigarettes  . Smokeless tobacco: Never Used  . Alcohol use 0.6 oz/week    1 Cans of beer per week     Comment: daily      Allergies   Patient has no known allergies.   Review of Systems Review of Systems  Constitutional: Negative for chills and fever.  HENT: Negative for congestion and facial swelling.   Eyes: Negative for discharge and visual disturbance.  Respiratory: Negative for shortness of breath.   Cardiovascular: Negative for chest pain and palpitations.  Gastrointestinal: Positive for abdominal pain. Negative for diarrhea and vomiting.  Musculoskeletal: Negative for arthralgias and myalgias.  Skin: Negative for color change and rash.  Neurological: Negative for tremors, syncope and headaches.  Psychiatric/Behavioral: Negative for confusion and dysphoric mood.     Physical Exam Updated Vital Signs BP 141/72 (BP Location: Left Arm)   Pulse 88   Temp 97.9 F (36.6 C) (Oral)   Resp 18   Ht 5\' 8"  (1.727 m)   Wt 150 lb (68 kg)   SpO2 98%   BMI 22.81 kg/m   Physical Exam  Constitutional: He is oriented to person, place, and time. He appears well-developed and well-nourished.  HENT:  Head: Normocephalic and atraumatic.  Eyes: EOM are normal. Pupils are equal, round, and reactive to light.  Neck: Normal range of motion. Neck supple. No JVD present.  Cardiovascular: Normal rate and regular rhythm.  Exam reveals no gallop and no friction rub.   No murmur heard. Pulmonary/Chest: No respiratory distress. He has no wheezes.  Abdominal: He exhibits mass (just inferior to the umbilicus). He exhibits no distension. There is tenderness. There is no rebound and no  guarding.  Musculoskeletal: Normal range of motion.  Neurological: He is alert and oriented to person, place, and time.  Skin: No rash noted. No pallor.  Psychiatric: He has a normal mood and affect. His behavior is normal.  Nursing note and vitals reviewed.    ED Treatments / Results  Labs (all labs ordered are listed, but only abnormal results are displayed) Labs Reviewed - No data to display  EKG  EKG Interpretation  Date/Time:  Monday March 15 2016 10:38:56 EST Ventricular Rate:  90 PR Interval:    QRS Duration: 100 QT Interval:  399 QTC Calculation: 489 R Axis:   -32 Text Interpretation:  Sinus rhythm Left axis deviation Nonspecific T abnormalities, lateral  leads Borderline prolonged QT interval Baseline wander in lead(s) II V3 No significant change since last tracing Confirmed by Avion Kutzer MD, DANIEL [phone]) on 03/15/2016 11:31:34 AM       Radiology No results found.  Procedures Hernia reduction Date/Time: 03/15/2016 11:32 AM Performed by: Tyrone Nine Avonna Iribe Authorized by: Deno Etienne  Consent: Verbal consent obtained. Risks and benefits: risks, benefits and alternatives were discussed Consent given by: patient Patient understanding: patient states understanding of the procedure being performed Patient consent: the patient's understanding of the procedure matches consent given Required items: required blood products, implants, devices, and special equipment available Patient identity confirmed: verbally with patient Time out: Immediately prior to procedure a "time out" was called to verify the correct patient, procedure, equipment, support staff and site/side marked as required. Local anesthesia used: no  Anesthesia: Local anesthesia used: no  Sedation: Patient sedated: no Patient tolerance: Patient tolerated the procedure well with no immediate complications    (including critical care time)  Medications Ordered in ED Medications - No data to display   Initial  Impression / Assessment and Plan / ED Course  I have reviewed the triage vital signs and the nursing notes.  Pertinent labs & imaging results that were available during my care of the patient were reviewed by me and considered in my medical decision making (see chart for details).  Clinical Course     70 yo M With a chief complaint of umbilical hernia. This is reduced at bedside. Patient feels better. Discharge home.  3:46 PM:  I have discussed the diagnosis/risks/treatment options with the patient and family and believe the pt to be eligible for discharge home to follow-up with PCP. We also discussed returning to the ED immediately if new or worsening sx occur. We discussed the sx which are most concerning (e.g., sudden worsening pain, fever, inability to tolerate by mouth) that necessitate immediate return. Medications administered to the patient during their visit and any new prescriptions provided to the patient are listed below.  Medications given during this visit Medications - No data to display   The patient appears reasonably screen and/or stabilized for discharge and I doubt any other medical condition or other [facility] requiring further screening, evaluation, or treatment in the ED at this time prior to discharge.    Final Clinical Impressions(s) / ED Diagnoses   Final diagnoses:  Umbilical hernia without obstruction and without gangrene    New Prescriptions Discharge Medication List as of 03/15/2016 11:22 AM       Deno Etienne, DO 03/15/16 1546

## 2016-03-21 ENCOUNTER — Emergency Department: Payer: Medicare Other

## 2016-03-21 ENCOUNTER — Observation Stay
Admission: EM | Admit: 2016-03-21 | Discharge: 2016-03-23 | Disposition: A | Discharge status: Home / Self Care | Payer: Medicare Other | Attending: Internal Medicine | Admitting: Internal Medicine

## 2016-03-21 ENCOUNTER — Encounter: Payer: Self-pay | Admitting: Emergency Medicine

## 2016-03-21 DIAGNOSIS — I471 Supraventricular tachycardia: Secondary | ICD-10-CM | POA: Insufficient documentation

## 2016-03-21 DIAGNOSIS — Z833 Family history of diabetes mellitus: Secondary | ICD-10-CM | POA: Diagnosis not present

## 2016-03-21 DIAGNOSIS — R4781 Slurred speech: Secondary | ICD-10-CM | POA: Insufficient documentation

## 2016-03-21 DIAGNOSIS — Z9119 Patient's noncompliance with other medical treatment and regimen: Secondary | ICD-10-CM

## 2016-03-21 DIAGNOSIS — G3189 Other specified degenerative diseases of nervous system: Secondary | ICD-10-CM | POA: Insufficient documentation

## 2016-03-21 DIAGNOSIS — N182 Chronic kidney disease, stage 2 (mild): Secondary | ICD-10-CM | POA: Diagnosis present

## 2016-03-21 DIAGNOSIS — E7800 Pure hypercholesterolemia, unspecified: Secondary | ICD-10-CM | POA: Insufficient documentation

## 2016-03-21 DIAGNOSIS — R0603 Acute respiratory distress: Secondary | ICD-10-CM | POA: Diagnosis not present

## 2016-03-21 DIAGNOSIS — R059 Cough, unspecified: Secondary | ICD-10-CM

## 2016-03-21 DIAGNOSIS — I5043 Acute on chronic combined systolic (congestive) and diastolic (congestive) heart failure: Secondary | ICD-10-CM | POA: Diagnosis not present

## 2016-03-21 DIAGNOSIS — Z91199 Patient's noncompliance with other medical treatment and regimen due to unspecified reason: Secondary | ICD-10-CM

## 2016-03-21 DIAGNOSIS — R05 Cough: Secondary | ICD-10-CM

## 2016-03-21 DIAGNOSIS — I088 Other rheumatic multiple valve diseases: Secondary | ICD-10-CM | POA: Diagnosis not present

## 2016-03-21 DIAGNOSIS — I509 Heart failure, unspecified: Secondary | ICD-10-CM

## 2016-03-21 DIAGNOSIS — I429 Cardiomyopathy, unspecified: Secondary | ICD-10-CM | POA: Insufficient documentation

## 2016-03-21 DIAGNOSIS — I214 Non-ST elevation (NSTEMI) myocardial infarction: Secondary | ICD-10-CM

## 2016-03-21 DIAGNOSIS — I130 Hypertensive heart and chronic kidney disease with heart failure and stage 1 through stage 4 chronic kidney disease, or unspecified chronic kidney disease: Secondary | ICD-10-CM | POA: Diagnosis not present

## 2016-03-21 DIAGNOSIS — C9210 Chronic myeloid leukemia, BCR/ABL-positive, not having achieved remission: Secondary | ICD-10-CM | POA: Insufficient documentation

## 2016-03-21 DIAGNOSIS — R0902 Hypoxemia: Secondary | ICD-10-CM | POA: Insufficient documentation

## 2016-03-21 DIAGNOSIS — Z8249 Family history of ischemic heart disease and other diseases of the circulatory system: Secondary | ICD-10-CM | POA: Diagnosis not present

## 2016-03-21 DIAGNOSIS — I5082 Biventricular heart failure: Secondary | ICD-10-CM | POA: Insufficient documentation

## 2016-03-21 DIAGNOSIS — Z87891 Personal history of nicotine dependence: Secondary | ICD-10-CM | POA: Insufficient documentation

## 2016-03-21 DIAGNOSIS — Z5900 Homelessness unspecified: Secondary | ICD-10-CM

## 2016-03-21 DIAGNOSIS — R7989 Other specified abnormal findings of blood chemistry: Secondary | ICD-10-CM

## 2016-03-21 DIAGNOSIS — Z8673 Personal history of transient ischemic attack (TIA), and cerebral infarction without residual deficits: Secondary | ICD-10-CM | POA: Diagnosis not present

## 2016-03-21 DIAGNOSIS — N183 Chronic kidney disease, stage 3 (moderate): Secondary | ICD-10-CM | POA: Diagnosis not present

## 2016-03-21 DIAGNOSIS — M6281 Muscle weakness (generalized): Secondary | ICD-10-CM

## 2016-03-21 DIAGNOSIS — I2510 Atherosclerotic heart disease of native coronary artery without angina pectoris: Secondary | ICD-10-CM | POA: Diagnosis not present

## 2016-03-21 DIAGNOSIS — J449 Chronic obstructive pulmonary disease, unspecified: Secondary | ICD-10-CM | POA: Insufficient documentation

## 2016-03-21 DIAGNOSIS — E1122 Type 2 diabetes mellitus with diabetic chronic kidney disease: Secondary | ICD-10-CM | POA: Diagnosis not present

## 2016-03-21 DIAGNOSIS — R2681 Unsteadiness on feet: Secondary | ICD-10-CM

## 2016-03-21 DIAGNOSIS — Z7982 Long term (current) use of aspirin: Secondary | ICD-10-CM | POA: Insufficient documentation

## 2016-03-21 DIAGNOSIS — R778 Other specified abnormalities of plasma proteins: Secondary | ICD-10-CM | POA: Diagnosis present

## 2016-03-21 DIAGNOSIS — C9110 Chronic lymphocytic leukemia of B-cell type not having achieved remission: Secondary | ICD-10-CM

## 2016-03-21 DIAGNOSIS — I10 Essential (primary) hypertension: Secondary | ICD-10-CM | POA: Diagnosis present

## 2016-03-21 LAB — COMPREHENSIVE METABOLIC PANEL
ALK PHOS: 202 U/L — AB (ref 38–126)
ALT: 33 U/L (ref 17–63)
AST: 54 U/L — ABNORMAL HIGH (ref 15–41)
Albumin: 3.7 g/dL (ref 3.5–5.0)
Anion gap: 9 (ref 5–15)
BILIRUBIN TOTAL: 1.4 mg/dL — AB (ref 0.3–1.2)
BUN: 25 mg/dL — ABNORMAL HIGH (ref 6–20)
CALCIUM: 9 mg/dL (ref 8.9–10.3)
CO2: 24 mmol/L (ref 22–32)
CREATININE: 1.45 mg/dL — AB (ref 0.61–1.24)
Chloride: 105 mmol/L (ref 101–111)
GFR calc non Af Amer: 47 mL/min — ABNORMAL LOW (ref >60)
GFR, EST AFRICAN AMERICAN: 55 mL/min — AB (ref >60)
Glucose, Bld: 142 mg/dL — ABNORMAL HIGH (ref 65–99)
Potassium: 4.2 mmol/L (ref 3.5–5.1)
SODIUM: 138 mmol/L (ref 135–145)
Total Protein: 7.5 g/dL (ref 6.5–8.1)

## 2016-03-21 LAB — BRAIN NATRIURETIC PEPTIDE: B Natriuretic Peptide: 2222 pg/mL — ABNORMAL HIGH (ref 0.0–100.0)

## 2016-03-21 LAB — CBC
HEMATOCRIT: 34 % — AB (ref 40.0–52.0)
Hemoglobin: 11 g/dL — ABNORMAL LOW (ref 13.0–18.0)
MCH: 32.6 pg (ref 26.0–34.0)
MCHC: 32.3 g/dL (ref 32.0–36.0)
MCV: 101 fL — AB (ref 80.0–100.0)
Platelets: 991 10*3/uL (ref 150–440)
RBC: 3.37 MIL/uL — ABNORMAL LOW (ref 4.40–5.90)
RDW: 18.3 % — AB (ref 11.5–14.5)
WBC: 37.2 10*3/uL — AB (ref 3.8–10.6)

## 2016-03-21 LAB — GLUCOSE, CAPILLARY: Glucose-Capillary: 136 mg/dL — ABNORMAL HIGH (ref 65–99)

## 2016-03-21 LAB — TROPONIN I
TROPONIN I: 0.05 ng/mL — AB (ref <0.03)
Troponin I: 0.06 ng/mL (ref <0.03)

## 2016-03-21 MED ORDER — LEVOFLOXACIN 500 MG PO TABS: 500.0000 mg | ORAL_TABLET | Freq: Every day | ORAL | Status: DC

## 2016-03-21 MED ORDER — BISACODYL 10 MG RE SUPP: 10.0000 mg | Freq: Every day | RECTAL | Status: DC | PRN

## 2016-03-21 MED ORDER — ONDANSETRON HCL 4 MG PO TABS
4.0000 mg | ORAL_TABLET | Freq: Four times a day (QID) | ORAL | Status: DC | PRN
Start: 2016-03-21 — End: 2016-03-23

## 2016-03-21 MED ORDER — FUROSEMIDE 10 MG/ML IJ SOLN
40.0000 mg | Freq: Two times a day (BID) | INTRAMUSCULAR | Status: DC
  Administered 2016-03-22 – 2016-03-23 (×3): 40 mg via INTRAVENOUS
  Filled 2016-03-21 (×3): qty 4

## 2016-03-21 MED ORDER — CARVEDILOL 3.125 MG PO TABS
3.1250 mg | ORAL_TABLET | Freq: Two times a day (BID) | ORAL | Status: DC
  Administered 2016-03-21 – 2016-03-23 (×4): 3.125 mg via ORAL
  Filled 2016-03-21 (×4): qty 1

## 2016-03-21 MED ORDER — ONDANSETRON HCL 4 MG/2ML IJ SOLN: 4.0000 mg | Freq: Four times a day (QID) | INTRAMUSCULAR | Status: DC | PRN

## 2016-03-21 MED ORDER — ACETAMINOPHEN 650 MG RE SUPP: 650.0000 mg | Freq: Four times a day (QID) | RECTAL | Status: DC | PRN

## 2016-03-21 MED ORDER — SODIUM CHLORIDE 0.9% FLUSH
3.0000 mL | Freq: Two times a day (BID) | INTRAVENOUS | Status: DC
  Administered 2016-03-21 – 2016-03-23 (×4): 3 mL via INTRAVENOUS

## 2016-03-21 MED ORDER — ACETAMINOPHEN 325 MG PO TABS
650.0000 mg | ORAL_TABLET | Freq: Four times a day (QID) | ORAL | Status: DC | PRN
  Administered 2016-03-21 – 2016-03-22 (×2): 650 mg via ORAL
  Filled 2016-03-21 (×2): qty 2

## 2016-03-21 MED ORDER — SODIUM CHLORIDE 0.9% FLUSH
3.0000 mL | Freq: Two times a day (BID) | INTRAVENOUS | Status: DC
  Administered 2016-03-21 – 2016-03-23 (×2): 3 mL via INTRAVENOUS

## 2016-03-21 MED ORDER — IPRATROPIUM-ALBUTEROL 0.5-2.5 (3) MG/3ML IN SOLN
RESPIRATORY_TRACT | Status: AC
  Administered 2016-03-21: 3 mL via RESPIRATORY_TRACT
  Filled 2016-03-21: qty 3

## 2016-03-21 MED ORDER — IPRATROPIUM-ALBUTEROL 0.5-2.5 (3) MG/3ML IN SOLN
3.0000 mL | Freq: Four times a day (QID) | RESPIRATORY_TRACT | Status: DC
  Administered 2016-03-21 – 2016-03-23 (×7): 3 mL via RESPIRATORY_TRACT
  Filled 2016-03-21 (×7): qty 3

## 2016-03-21 MED ORDER — DIGOXIN 125 MCG PO TABS
0.1250 mg | ORAL_TABLET | Freq: Every day | ORAL | Status: DC
  Administered 2016-03-22 – 2016-03-23 (×2): 0.125 mg via ORAL
  Filled 2016-03-21 (×2): qty 1

## 2016-03-21 MED ORDER — ENOXAPARIN SODIUM 40 MG/0.4ML ~~LOC~~ SOLN
40.0000 mg | SUBCUTANEOUS | Status: DC
  Administered 2016-03-21 – 2016-03-22 (×2): 40 mg via SUBCUTANEOUS
  Filled 2016-03-21 (×2): qty 0.4

## 2016-03-21 MED ORDER — AMIODARONE HCL 200 MG PO TABS
200.0000 mg | ORAL_TABLET | Freq: Every day | ORAL | Status: DC
  Administered 2016-03-22 – 2016-03-23 (×2): 200 mg via ORAL
  Filled 2016-03-21 (×3): qty 1

## 2016-03-21 MED ORDER — LEVOFLOXACIN IN D5W 750 MG/150ML IV SOLN
750.0000 mg | Freq: Once | INTRAVENOUS | Status: AC
  Administered 2016-03-21: 750 mg via INTRAVENOUS
  Filled 2016-03-21: qty 150

## 2016-03-21 MED ORDER — DOCUSATE SODIUM 100 MG PO CAPS
100.0000 mg | ORAL_CAPSULE | Freq: Two times a day (BID) | ORAL | Status: DC
  Administered 2016-03-21 – 2016-03-23 (×4): 100 mg via ORAL
  Filled 2016-03-21 (×4): qty 1

## 2016-03-21 MED ORDER — INSULIN ASPART 100 UNIT/ML ~~LOC~~ SOLN
0.0000 [IU] | Freq: Three times a day (TID) | SUBCUTANEOUS | Status: DC
  Administered 2016-03-22 (×2): 1 [IU] via SUBCUTANEOUS
  Filled 2016-03-21 (×2): qty 1

## 2016-03-21 MED ORDER — ASPIRIN 81 MG PO CHEW
324.0000 mg | CHEWABLE_TABLET | Freq: Once | ORAL | Status: AC
  Administered 2016-03-21: 324 mg via ORAL
  Filled 2016-03-21: qty 4

## 2016-03-21 MED ORDER — ASPIRIN 81 MG PO CHEW
81.0000 mg | CHEWABLE_TABLET | Freq: Every day | ORAL | Status: DC
  Administered 2016-03-22 – 2016-03-23 (×2): 81 mg via ORAL
  Filled 2016-03-21 (×3): qty 1

## 2016-03-21 MED ORDER — SODIUM CHLORIDE 0.9 % IV SOLN: 250.0000 mL | INTRAVENOUS | Status: DC | PRN

## 2016-03-21 MED ORDER — SACUBITRIL-VALSARTAN 49-51 MG PO TABS
1.0000 | ORAL_TABLET | Freq: Two times a day (BID) | ORAL | Status: DC
  Administered 2016-03-21 – 2016-03-23 (×4): 1 via ORAL
  Filled 2016-03-21 (×4): qty 1

## 2016-03-21 MED ORDER — ALPRAZOLAM 0.25 MG PO TABS
0.2500 mg | ORAL_TABLET | Freq: Two times a day (BID) | ORAL | Status: DC | PRN
  Administered 2016-03-22: 0.25 mg via ORAL
  Filled 2016-03-21: qty 1

## 2016-03-21 MED ORDER — SODIUM CHLORIDE 0.9% FLUSH: 3.0000 mL | INTRAVENOUS | Status: DC | PRN

## 2016-03-21 MED ORDER — CLOPIDOGREL BISULFATE 75 MG PO TABS
75.0000 mg | ORAL_TABLET | Freq: Every day | ORAL | Status: DC
  Administered 2016-03-21 – 2016-03-23 (×3): 75 mg via ORAL
  Filled 2016-03-21 (×3): qty 1

## 2016-03-21 MED ORDER — ALBUTEROL SULFATE (2.5 MG/3ML) 0.083% IN NEBU: 3.0000 mL | INHALATION_SOLUTION | RESPIRATORY_TRACT | Status: DC | PRN

## 2016-03-21 MED ORDER — FUROSEMIDE 10 MG/ML IJ SOLN
40.0000 mg | Freq: Once | INTRAMUSCULAR | Status: AC
  Administered 2016-03-21: 40 mg via INTRAVENOUS
  Filled 2016-03-21: qty 4

## 2016-03-21 MED ORDER — PANTOPRAZOLE SODIUM 40 MG PO TBEC
40.0000 mg | DELAYED_RELEASE_TABLET | Freq: Every day | ORAL | Status: DC
  Administered 2016-03-21 – 2016-03-23 (×3): 40 mg via ORAL
  Filled 2016-03-21 (×3): qty 1

## 2016-03-21 MED ORDER — ATORVASTATIN CALCIUM 20 MG PO TABS
40.0000 mg | ORAL_TABLET | Freq: Every day | ORAL | Status: DC
  Administered 2016-03-21 – 2016-03-23 (×3): 40 mg via ORAL
  Filled 2016-03-21 (×3): qty 2

## 2016-03-21 NOTE — Progress Notes (Signed)
Patient received on 2a, Alert and oriented able to make needs known. Tele called in to CCMD, box verified with Gerald Stabs Nt. Skin assessment completed with RN Tammy, dry skin noted. Fall prevention patient safety plan completed with patient. Patient stable at this time, no distress noted. Will continue to monitor.

## 2016-03-21 NOTE — ED Provider Notes (Signed)
[facility] Emergency Department Provider Note  ____________________________________________  Time seen: Approximately 3:24 PM  I have reviewed the triage vital signs and the nursing notes.   HISTORY  Chief Complaint Shortness of Breath and Joint Swelling    HPI Bruce [name] is a 70 y.o. male with a history of CML, HTN, HL, CAD, CVA, CK ED presenting with lower extremity edema, chest pain, exertional shortness of breath, productive cough. The patient reports that for the past week he has had a progressively worsening bilateral symmetric lower extremity edema without calf pain. He has also been having exertional dyspnea, with occasional shortness of breath at rest. He is a poor historian and describes some chest pain, stating that he has a central burning sensation in that 2 or 3 days ago it radiated into the neck; he is unable to give me the frequency or more details about his chest pain. He denies any fever, chills. The patient states he is no longer being treated for his leukemia, but does not know why "my doctor won't see me anymore."  The patient was seen at [facility] 12/04 for abdominal pain.   Past Medical History:  Diagnosis Date  . Bell's palsy   . CKD (chronic kidney disease), stage II   . CML (chronic myelocytic leukemia) ([place])   . Coronary artery disease   . Hypercholesterolemia   . Hypertension   . Leukemia ([place])   . Stroke ([place])    No residual limb weakness.  Walks with cane at baseline.   Marland Kitchen TIA (transient ischemic attack) 05/10/2014    Patient Active Problem List   Diagnosis Date Noted  . Chronic systolic heart failure ([place]) 12/30/2015  . Elevated lactic acid level 10/11/2015  . CKD (chronic kidney disease), stage II   . Dizziness 01/25/2015  . Essential hypertension 01/25/2015  . Compliance poor 01/25/2015  . Coronary artery disease   . H/O: stroke with residual effects 09/13/2014  . Transaminitis 09/13/2014  . Elevated  troponin 09/13/2014  . TIA (transient ischemic attack) 05/10/2014  . CML (chronic myelocytic leukemia) ([place]) 03/27/2014  . Leukocytosis 03/18/2014  . Chest pain 03/05/2014    Past Surgical History:  Procedure Laterality Date  . BACK SURGERY    . HIP ARTHROPLASTY Right    orif  . ORIF FOREARM FRACTURE Right     Current Outpatient Rx  . Order #: [id number] Class: Normal  . Order #: [id number] Class: Print  . Order #: [id number] Class: Historical Med  . Order #: [id number] Class: Normal  . Order #: [id number] Class: No Print  . Order #: [id number] Class: Normal  . Order #: [id number] Class: Normal  . Order #: [id number] Class: Normal  . Order #: [id number] Class: Print  . Order #: [id number] Class: Normal  . Order #: [id number] Class: Normal  . Order #: [id number] Class: Historical Med    Allergies Patient has no known allergies.  Family History  Problem Relation Age of Onset  . Diabetes Mother   . Hypertension Mother   . Diabetes Father   . Hypertension Father   . Diabetes Brother   . Hypertension Brother   . Diabetes Sister   . Hypertension Sister   . Diabetes Brother   . Hypertension Brother   . Diabetes Sister   . Hypertension Sister     Social History Social History  Substance Use Topics  . Smoking status: Former Smoker    Packs/day: 0.50    Years: 50.00    Types: Cigarettes  . Smokeless tobacco: Never Used  .  Alcohol use 0.6 oz/week    1 Cans of beer per week     Comment: daily     Review of Systems Constitutional: No fever/chills.No lightheadedness or syncope. Eyes: No visual changes. No eye discharge. ENT: No sore throat. No congestion or rhinorrhea. Cardiovascular: Positive chest pain. Denies palpitations. Respiratory: Positive exertional shortness of breath.  Positive cough productive of white sputum.. Gastrointestinal: No abdominal pain.  No nausea, no vomiting.  No diarrhea.  No constipation. Genitourinary: Negative for dysuria. Musculoskeletal: Negative for  back pain. Positive bilateral lower extremity swelling is symmetric. No Pain. Skin: Negative for rash. Neurological: Negative for headaches. No focal numbness, tingling or weakness.   10-point ROS otherwise negative.  ____________________________________________   PHYSICAL EXAM:  VITAL SIGNS: ED Triage Vitals [03/21/16 1402]  Enc Vitals Group     BP 130/79     Pulse Rate 84     Resp (!) 24     Temp 98.1 F (36.7 C)     Temp Source Oral     SpO2 99 %     Weight 150 lb (68 kg)     Height 5\' 8"  (1.727 m)     Head Circumference      Peak Flow      Pain Score 9     Pain Loc      Pain Edu?      Excl. in [place]?     Constitutional: Alert and oriented. Chronically ill appearing but nontoxic. Answers questions appropriately. Eyes: Conjunctivae are normal.  EOMI. No scleral icterus. No eye discharge. Head: Atraumatic. Nose: No congestion/rhinnorhea. Mouth/Throat: Mucous membranes are moist.  Neck: No stridor.  Supple.  Positive JVD. No meningismus. Cardiovascular: Normal rate, regular rhythm. No murmurs, rubs or gallops.  Respiratory: Normal respiratory effort.  No accessory muscle use or retractions. Lungs CTAB.  No wheezes, rales or ronchi. Gastrointestinal: Soft, nontender and nondistended.  No guarding or rebound.  No peritoneal signs. Musculoskeletal: Bilateral pitting lower extremity edema to the proximal third of the tibia. No ttp in the calves or palpable cords.  Negative Homan's sign. Neurologic:  Alert but has difficulty giving a clear history.  Speech is clear.  Face and smile are symmetric.  EOMI.  Moves all extremities well. Skin:  Skin is warm, dry and intact. No rash noted. Psychiatric: Mood and affect are normal. Speech and behavior are normal.  Normal judgement  ____________________________________________   LABS (all labs ordered are listed, but only abnormal results are displayed)  Labs Reviewed  CBC - Abnormal; Notable for the following:       Result Value    WBC 37.2 (*)    RBC 3.37 (*)    Hemoglobin 11.0 (*)    HCT 34.0 (*)    MCV 101.0 (*)    RDW 18.3 (*)    Platelets 991 (*)    All other components within normal limits  TROPONIN I - Abnormal; Notable for the following:    Troponin I 0.06 (*)    All other components within normal limits  BRAIN NATRIURETIC PEPTIDE - Abnormal; Notable for the following:    B Natriuretic Peptide 2,222.0 (*)    All other components within normal limits  COMPREHENSIVE METABOLIC PANEL - Abnormal; Notable for the following:    Glucose, Bld 142 (*)    BUN 25 (*)    Creatinine, Ser 1.45 (*)    AST 54 (*)    Alkaline Phosphatase 202 (*)    Total Bilirubin 1.4 (*)  GFR calc non Af Amer 47 (*)    GFR calc Af Amer 55 (*)    All other components within normal limits  CULTURE, BLOOD (ROUTINE X 2)  CULTURE, BLOOD (ROUTINE X 2)  URINALYSIS, COMPLETE (UACMP) WITH MICROSCOPIC   ____________________________________________  EKG  ED ECG REPORT I, Eula Listen, the attending physician, personally viewed and interpreted this ECG.   Date: 03/21/2016  EKG Time: 1405  Rate: 84  Rhythm: normal sinus rhythm  Axis: leftward   Intervals:prolonged QTc  ST&T Change: No STEMI  ____________________________________________  RADIOLOGY  Dg Chest 2 View  Result Date: 03/21/2016 CLINICAL DATA:  Shortness of breath.  Productive cough. EXAM: CHEST  2 VIEW COMPARISON:  12/03/2015 chest radiograph. FINDINGS: Stable cardiomediastinal silhouette with mild cardiomegaly. No pneumothorax. No pleural effusion. Cephalization of the pulmonary vasculature without overt pulmonary edema. No acute consolidative airspace disease. IMPRESSION: Stable mild cardiomegaly without overt pulmonary edema. No acute pulmonary disease. Electronically Signed   By: Ilona Sorrel M.D.   On: 03/21/2016 14:48    ____________________________________________   PROCEDURES  Procedure(s) performed: None  Procedures  Critical Care performed:  No ____________________________________________   INITIAL IMPRESSION / ASSESSMENT AND PLAN / ED COURSE  Pertinent labs & imaging results that were available during my care of the patient were reviewed by me and considered in my medical decision making (see chart for details).  70 y.o. male with currently untreated leukemia presenting with multiple vague symptoms including bilateral lower extremity edema, exertional shortness breath, chest pain, cough and cold symptoms. Overall, the patient has reassuring vital signs and is afebrile. He is a very poor historian, but does have signs of fluid overload with an elevated BNP and an elevated troponin. I will give the patient aspirin, Lasix, and also cover him with antibiotics if he may have pneumonia causing his cough although his elevated wbc count is likely due to his untreated leukemia.. The patient will be admitted to the hospital for further evaluation and treatment.  ____________________________________________  FINAL CLINICAL IMPRESSION(S) / ED DIAGNOSES  Final diagnoses:  Acute on chronic congestive heart failure, unspecified congestive heart failure type ([place])  NSTEMI (non-ST elevated myocardial infarction) ([place])  Cough    Clinical Course       NEW MEDICATIONS STARTED DURING THIS VISIT:  New Prescriptions   No medications on file      Eula Listen, MD 03/21/16 1539

## 2016-03-21 NOTE — Care Management Obs Status (Signed)
[place] NOTIFICATION   Patient Details  Name: Bruce [name] MRN: [id number] Date of Birth: 1946-01-31   Medicare Observation Status Notification Given:  Yes    Ival Bible, RN 03/21/2016, 4:43 PM

## 2016-03-21 NOTE — H&P (Signed)
History and Physical    Bruce [name] [id number] DOB: 1945-11-08 DOA: 03/21/2016  Referring physician: Dr. Mariea Clonts PCP: [name], MD  Specialists: none  Chief Complaint: CP with SOB  HPI: Bruce [name] is a 70 y.o. male has a past medical history significant for CML, COPD, DM, and chronic systolic CHF now with worsening SOB/DOE with LE edema and CP. In ER, pt denies CP. Does c/o SOB and edema. CXR appears stable with pulmonary edema. Troponin mildly elevated. He is now admitted.  Review of Systems: The patient denies anorexia, fever, weight loss,, vision loss, decreased hearing, hoarseness, syncope, balance deficits, hemoptysis, abdominal pain, melena, hematochezia, severe indigestion/heartburn, hematuria, incontinence, genital sores, muscle weakness, suspicious skin lesions, transient blindness, difficulty walking, depression, unusual weight change, abnormal bleeding, enlarged lymph nodes, angioedema, and breast masses.   Past Medical History:  Diagnosis Date  . Bell's palsy   . CKD (chronic kidney disease), stage II   . CML (chronic myelocytic leukemia) ([place])   . Coronary artery disease   . Hypercholesterolemia   . Hypertension   . Leukemia ([place])   . Stroke ([place])    No residual limb weakness.  Walks with cane at baseline.   Marland Kitchen TIA (transient ischemic attack) 05/10/2014   Past Surgical History:  Procedure Laterality Date  . BACK SURGERY    . HIP ARTHROPLASTY Right    orif  . ORIF FOREARM FRACTURE Right    Social History:  reports that he has quit smoking. His smoking use included Cigarettes. He has a 25.00 pack-year smoking history. He has never used smokeless tobacco. He reports that he drinks about 0.6 oz of alcohol per week . He reports that he uses drugs, including Marijuana and Cocaine.  No Known Allergies  Family History  Problem Relation Age of Onset  . Diabetes Mother   . Hypertension Mother   . Diabetes Father   . Hypertension Father   .  Diabetes Brother   . Hypertension Brother   . Diabetes Sister   . Hypertension Sister   . Diabetes Brother   . Hypertension Brother   . Diabetes Sister   . Hypertension Sister     Prior to Admission medications   Medication Sig Start Date End Date Taking? Authorizing Provider  acetaminophen (TYLENOL) 325 MG tablet Take 2 tablets (650 mg total) by mouth every 4 (four) hours as needed for headache or mild pain. 06/06/15   [name], MD  albuterol (PROVENTIL HFA;VENTOLIN HFA) 108 (90 BASE) MCG/ACT inhaler Inhale 2 puffs into the lungs every 4 (four) hours as needed for wheezing or shortness of breath. 08/08/14   Noemi Chapel, MD  ALPRAZolam Duanne Moron) 0.25 MG tablet Take 0.25 mg by mouth 2 (two) times daily as needed for anxiety.    Historical Provider, MD  amiodarone (PACERONE) 200 MG tablet Take 1 tablet (200 mg total) by mouth daily. 12/30/15   Shirley Friar, PA-C  aspirin 81 MG chewable tablet Chew 1 tablet (81 mg total) by mouth daily. 09/22/14   Hosie Poisson, MD  atorvastatin (LIPITOR) 40 MG tablet Take 1 tablet (40 mg total) by mouth daily. 12/25/15   Amy D Clegg, NP  carvedilol (COREG) 3.125 MG tablet Take 1 tablet (3.125 mg total) by mouth 2 (two) times daily. 12/25/15   Amy D Ninfa Meeker, NP  clopidogrel (PLAVIX) 75 MG tablet Take 1 tablet (75 mg total) by mouth daily. 03/21/14   [name], MD  digoxin (LANOXIN) 0.125 MG tablet Take 1 tablet (  0.125 mg total) by mouth daily. 01/29/15   Barton Dubois, MD  sacubitril-valsartan (ENTRESTO) 49-51 MG Take 1 tablet by mouth 2 (two) times daily. 12/30/15   Shirley Friar, PA-C  spironolactone (ALDACTONE) 25 MG tablet Take 0.5 tablets (12.5 mg total) by mouth daily. 12/17/15 03/16/16  Larey Dresser, MD  torsemide (DEMADEX) 20 MG tablet Take 80 mg by mouth daily.    Historical Provider, MD   Physical Exam: Vitals:   03/21/16 1402  BP: 130/79  Pulse: 84  Resp: (!) 24  Temp: 98.1 F (36.7 C)  TempSrc: Oral  SpO2: 99%  Weight: 68  kg (150 lb)  Height: 5\' 8"  (1.727 m)     General:  No apparent distress, WDWN, [place]/AT  Eyes: PERRL, EOMI, no scleral icterus, conjunctiva clear  ENT: moist oropharynx without exudate, TM's benign, dentition fair  Neck: supple, no lymphadenopathy. No bruits or thyromegaly  Cardiovascular: regular rate without MRG; 2+ peripheral pulses, no JVD, 1+ peripheral edema  Respiratory: basilar rales without wheezes or rhonchi. No dullness. Respiratory effort increased.  Abdomen: soft, non tender to palpation, positive bowel sounds, no guarding, no rebound  Skin: no rashes or lesions  Musculoskeletal: normal bulk and tone, no joint swelling  Psychiatric: normal mood and affect, A&OX3  Neurologic: CN 2-12 grossly intact, Motor strength 5/5 in all 4 groups with symmetric DTR's and non-focal sensory exam  Labs on Admission:  Basic Metabolic Panel:  Recent Labs Lab 03/21/16 1408  NA 138  K 4.2  CL 105  CO2 24  GLUCOSE 142*  BUN 25*  CREATININE 1.45*  CALCIUM 9.0   Liver Function Tests:  Recent Labs Lab 03/21/16 1408  AST 54*  ALT 33  ALKPHOS 202*  BILITOT 1.4*  PROT 7.5  ALBUMIN 3.7   No results for input(s): LIPASE, AMYLASE in the last 168 hours. No results for input(s): AMMONIA in the last 168 hours. CBC:  Recent Labs Lab 03/21/16 1408  WBC 37.2*  HGB 11.0*  HCT 34.0*  MCV 101.0*  PLT 991*   Cardiac Enzymes:  Recent Labs Lab 03/21/16 1408  TROPONINI 0.06*    BNP (last 3 results)  Recent Labs  12/24/15 1330 12/30/15 1055 03/21/16 1408  BNP 3,884.4* 3,310.3* 2,222.0*    ProBNP (last 3 results) No results for input(s): PROBNP in the last 8760 hours.  CBG: No results for input(s): GLUCAP in the last 168 hours.  Radiological Exams on Admission: Dg Chest 2 View  Result Date: 03/21/2016 CLINICAL DATA:  Shortness of breath.  Productive cough. EXAM: CHEST  2 VIEW COMPARISON:  12/03/2015 chest radiograph. FINDINGS: Stable cardiomediastinal  silhouette with mild cardiomegaly. No pneumothorax. No pleural effusion. Cephalization of the pulmonary vasculature without overt pulmonary edema. No acute consolidative airspace disease. IMPRESSION: Stable mild cardiomegaly without overt pulmonary edema. No acute pulmonary disease. Electronically Signed   By: Ilona Sorrel M.D.   On: 03/21/2016 14:48    EKG: Independently reviewed.  Assessment/Plan Principal Problem:   Chest pain Active Problems:   CML (chronic myelocytic leukemia) (H[facility])   Elevated troponin   Chronic systolic heart failure ([facility])   Will observe on telemetry and follow enzymes. Begin IV Lasix and order echo. Consult Cardiology. Repeat labs and CXR in AM  Diet: low salt Fluids: saline lock DVT Prophylaxis: Lovenox  Code Status: FULL  Family Communication: none  Disposition Plan: home  Time spent: 50 min

## 2016-03-21 NOTE — ED Triage Notes (Signed)
Pt presents to ED with c/o [facility] and edema in both ankles. Pt states hx of CHF. Pt presents with [facility] with walking, productive cough with thick, frothy spit. Pt is alert and oriented and is able to answer questions. Pt states SHOB is from abdominal hernia.

## 2016-03-21 NOTE — Progress Notes (Signed)
Received report from outgoing [place] and did patient's assessments at 2005. The RN was in the process of entering  patient's information on the computer when she realized that the  patient  has been assigned to another nurse Laural Golden  RN). The changes were not communicated to me or Ashok Norris. Laural Golden RN called around 2130 and there that we all realized there was a big miscommunication. The RN in question was not comfortable assuming patient's care due to some orders delayed so I decided to reassume his care. Due to miscommunication, patient's care was delayed but no harm occurred to him. Patient is resting right now with his eyes closed, respirations even and unlabored. Will continue to monitor.

## 2016-03-22 ENCOUNTER — Observation Stay: Payer: Medicare Other

## 2016-03-22 ENCOUNTER — Observation Stay (HOSPITAL_BASED_OUTPATIENT_CLINIC_OR_DEPARTMENT_OTHER)
Admit: 2016-03-22 | Discharge: 2016-03-22 | Disposition: A | Discharge status: Home / Self Care | Payer: Medicare Other | Attending: Internal Medicine | Admitting: Internal Medicine

## 2016-03-22 DIAGNOSIS — I5043 Acute on chronic combined systolic (congestive) and diastolic (congestive) heart failure: Secondary | ICD-10-CM

## 2016-03-22 DIAGNOSIS — Z5900 Homelessness unspecified: Secondary | ICD-10-CM

## 2016-03-22 DIAGNOSIS — R4781 Slurred speech: Secondary | ICD-10-CM

## 2016-03-22 DIAGNOSIS — Z8673 Personal history of transient ischemic attack (TIA), and cerebral infarction without residual deficits: Secondary | ICD-10-CM

## 2016-03-22 DIAGNOSIS — I509 Heart failure, unspecified: Secondary | ICD-10-CM

## 2016-03-22 DIAGNOSIS — R0603 Acute respiratory distress: Secondary | ICD-10-CM | POA: Diagnosis present

## 2016-03-22 LAB — URINALYSIS, COMPLETE (UACMP) WITH MICROSCOPIC
BACTERIA UA: NONE SEEN
Bilirubin Urine: NEGATIVE
Glucose, UA: NEGATIVE mg/dL
Hgb urine dipstick: NEGATIVE
Ketones, ur: NEGATIVE mg/dL
Leukocytes, UA: NEGATIVE
Nitrite: NEGATIVE
PH: 6 (ref 5.0–8.0)
Protein, ur: NEGATIVE mg/dL
SPECIFIC GRAVITY, URINE: 1.005 (ref 1.005–1.030)
SQUAMOUS EPITHELIAL / LPF: NONE SEEN
WBC, UA: NONE SEEN WBC/hpf (ref 0–5)

## 2016-03-22 LAB — COMPREHENSIVE METABOLIC PANEL
ALK PHOS: 173 U/L — AB (ref 38–126)
ALT: 31 U/L (ref 17–63)
AST: 52 U/L — AB (ref 15–41)
Albumin: 3.3 g/dL — ABNORMAL LOW (ref 3.5–5.0)
Anion gap: 7 (ref 5–15)
BUN: 22 mg/dL — AB (ref 6–20)
CALCIUM: 8.6 mg/dL — AB (ref 8.9–10.3)
CO2: 24 mmol/L (ref 22–32)
CREATININE: 1.41 mg/dL — AB (ref 0.61–1.24)
Chloride: 105 mmol/L (ref 101–111)
GFR calc non Af Amer: 49 mL/min — ABNORMAL LOW (ref >60)
GFR, EST AFRICAN AMERICAN: 57 mL/min — AB (ref >60)
Glucose, Bld: 131 mg/dL — ABNORMAL HIGH (ref 65–99)
Potassium: 3.7 mmol/L (ref 3.5–5.1)
SODIUM: 136 mmol/L (ref 135–145)
Total Bilirubin: 1.4 mg/dL — ABNORMAL HIGH (ref 0.3–1.2)
Total Protein: 6.4 g/dL — ABNORMAL LOW (ref 6.5–8.1)

## 2016-03-22 LAB — CBC
HCT: 30.6 % — ABNORMAL LOW (ref 40.0–52.0)
Hemoglobin: 9.9 g/dL — ABNORMAL LOW (ref 13.0–18.0)
MCH: 32.1 pg (ref 26.0–34.0)
MCHC: 32.5 g/dL (ref 32.0–36.0)
MCV: 98.7 fL (ref 80.0–100.0)
PLATELETS: 864 10*3/uL — AB (ref 150–440)
RBC: 3.1 MIL/uL — AB (ref 4.40–5.90)
RDW: 17.9 % — AB (ref 11.5–14.5)
WBC: 34 10*3/uL — ABNORMAL HIGH (ref 3.8–10.6)

## 2016-03-22 LAB — TROPONIN I
Troponin I: 0.04 ng/mL (ref <0.03)
Troponin I: 0.05 ng/mL (ref <0.03)

## 2016-03-22 LAB — PROCALCITONIN: Procalcitonin: 0.12 ng/mL

## 2016-03-22 LAB — GLUCOSE, CAPILLARY
GLUCOSE-CAPILLARY: 127 mg/dL — AB (ref 65–99)
GLUCOSE-CAPILLARY: 120 mg/dL — AB (ref 65–99)
GLUCOSE-CAPILLARY: 151 mg/dL — AB (ref 65–99)
Glucose-Capillary: 147 mg/dL — ABNORMAL HIGH (ref 65–99)

## 2016-03-22 MED ORDER — GUAIFENESIN-DM 100-10 MG/5ML PO SYRP
5.0000 mL | ORAL_SOLUTION | ORAL | Status: DC | PRN
  Administered 2016-03-22: 5 mL via ORAL
  Filled 2016-03-22: qty 5

## 2016-03-22 NOTE — Evaluation (Signed)
Physical Therapy Evaluation Patient Details Name: Bruce [name] MRN: [id number] DOB: 01/09/1946 Today's Date: 03/22/2016   History of Present Illness  Bruce [name] is a 70 y.o. male has a past medical history significant for CML, COPD, DM, and chronic systolic CHF now with worsening SOB/DOE with LE edema and CP. In ER, pt denies CP. Does c/o SOB and edema. CXR appears stable with pulmonary edema. Troponin mildly elevated. He is now admitted for acute respiratory distress with hypoxia, acute on chronic CHF, and elevated troponin likely due to demand ischemia  Clinical Impression  Pt admitted with above diagnosis. Pt currently with functional limitations due to the deficits listed below (see PT Problem List).  Pt presents with some general unsteadiness and weakness but does not appears to be acutely related to his current admission. He reports a recent fall due to LE weakness and reportedly uses a single point cane for ambulation. He is much steadier with use of a rolling walker and recommend he utilize one at discharge. Pt with LOB with feet together stance and Rhomberg testing. Single leg balance is approximately 2 seconds on each LE. He would benefit from OP PT at discharge to work on his balance and strength. Unclear if pt would agree to this but will agree to using a walker for safety. Pt reports that he lives in his car in [place]. Discharge safety is somewhat complicated due to this fact and pt would benefit with follow-up by social work. Pt will benefit from skilled PT services to address deficits in strength, balance, and mobility in order to return to full function at home.     Follow Up Recommendations Outpatient PT    Equipment Recommendations  Rolling walker with 5" wheels    Recommendations for Other Services       Precautions / Restrictions Precautions Precautions: None Restrictions Weight Bearing Restrictions: No      Mobility  Bed Mobility Overal bed mobility:  Modified Independent             General bed mobility comments: Pt requires slight increase in time and use of bed rail but able to perform without assistance  Transfers Overall transfer level: Modified independent Equipment used: None             General transfer comment: Pt demonstrate safe hand placement during transfer. Once upright pt with some mild increased sway but able to stabilize without UE support or external assistance  Ambulation/Gait Ambulation/Gait assistance: Min guard Ambulation Distance (Feet): 200 Feet Assistive device: Rolling walker (2 wheeled) Gait Pattern/deviations: Decreased step length - right;Decreased step length - left Gait velocity: Decreased but functional for limited community mobility Gait velocity interpretation: <1.8 ft/sec, indicative of risk for recurrent falls General Gait Details: Pt ambulates with short steps with decreased toe to floor clearance. Initially ambulated with spc with some mild instability. Suggested pt attempt ambulation with rolling walker. He demonstrates considerable improvement in stability with rolling walker. Pt demonstrates safe use of rolling walker. SaO2 remains at or above 95% on room air during ambulation. SaO2 actually around 92-93% at rest so improves with activity. HR remains within acceptable range. Denies DOE or increase in baseline chest pain with ambulation  Stairs            Wheelchair Mobility    Modified Rankin (Stroke Patients Only)       Balance Overall balance assessment: Needs assistance Sitting-balance support: No upper extremity supported Sitting balance-Leahy Scale: Good     Standing balance  support: No upper extremity supported Standing balance-Leahy Scale: Fair Standing balance comment: Pt demonstrates difficulty obtaining feet together stance without UE support. Increased sway with eyes open and LOB with eyes closed. Single leg balance approximately 2 seconds on each side                              Pertinent Vitals/Pain Pain Assessment: 0-10 Pain Score: 5  Pain Location: Central chest pain Pain Descriptors / Indicators: Sharp Pain Intervention(s): Monitored during session;Other (comment) (RN aware, cardiology has evaluated)    [place] expects to be discharged to:: Unsure                 Additional Comments: Pt reports that he lives in his car. Eats at various churches/locations in [place].    Prior Function Level of Independence: Independent with assistive device(s)         Comments: uses spc for ambulation. Pt reports 1 fall in the last 12 months. States that it occurred 2 weeks ago. Pt reports that his legs gave out on him     Hand Dominance   Dominant Hand: Right    Extremity/Trunk Assessment   Upper Extremity Assessment: Overall WFL for tasks assessed;LUE deficits/detail       LUE Deficits / Details: 4-/5 L shoulder flexion compared to 4+/5 R shoulder flexion. Otherwise no focal weakness identified   Lower Extremity Assessment: Overall WFL for tasks assessed         Communication   Communication: No difficulties  Cognition Arousal/Alertness: Awake/alert Behavior During Therapy: WFL for tasks assessed/performed Overall Cognitive Status: Within Functional Limits for tasks assessed                      General Comments      Exercises     Assessment/Plan    PT Assessment Patient needs continued PT services  PT Problem List Decreased strength;Decreased balance;Decreased knowledge of use of DME;Decreased safety awareness          PT Treatment Interventions DME instruction;Gait training;Therapeutic exercise;Balance training;Neuromuscular re-education;Patient/family education    PT Goals (Current goals can be found in the Care Plan section)  Acute Rehab PT Goals Patient Stated Goal: Return to prior level of function PT Goal Formulation: With patient Time For Goal Achievement:  04/05/16 Potential to Achieve Goals: Fair    Frequency Min 2X/week   Barriers to discharge Decreased caregiver support Pt lives alone in a car    Co-evaluation               End of Session Equipment Utilized During Treatment: Gait belt Activity Tolerance: Patient tolerated treatment well;No increased pain Patient left: in bed;with call bell/phone within reach Nurse Communication: Mobility status    Functional Assessment Tool Used: clinical judgement Functional Limitation: Mobility: Walking and moving around Mobility: Walking and Moving Around Current Status [id number]): At least 20 percent but less than 40 percent impaired, limited or restricted Mobility: Walking and Moving Around Goal Status [phone]): At least 1 percent but less than 20 percent impaired, limited or restricted    Time: 1010-1029 PT Time Calculation (min) (ACUTE ONLY): 19 min   Charges:   PT Evaluation $PT Eval Moderate Complexity: 1 Procedure     PT G Codes:   PT G-Codes **NOT FOR INPATIENT CLASS** Functional Assessment Tool Used: clinical judgement Functional Limitation: Mobility: Walking and moving around Mobility: Walking and Moving Around Current Status [id number]): At  least 20 percent but less than 40 percent impaired, limited or restricted Mobility: Walking and Moving Around Goal Status [phone]): At least 1 percent but less than 20 percent impaired, limited or restricted    Deni Berti 03/22/2016, 10:47 AM

## 2016-03-22 NOTE — Consult Note (Signed)
Cardiology Consultation Note  Patient ID: Bruce [name], MRN: [id number], DOB/AGE: [birth year] 70 y.o. Admit date: 03/21/2016   Date of Consult: 03/22/2016 Primary Physician: [name], MD Primary Cardiologist: [name], MD Requesting Physician: Dr. Doy Hutching, MD  Chief Complaint: SOB Reason for Consult: Acute on chronic combined CHF  HPI: 70 y.o. male with h/o chronic combined CHF class III, NICM with cardiac cath in 2008 showing nonobstructive CAD, homeless x 3 weeks living in his car, CKD stage II-III, medication noncompliance, CVA x 2 without Afib work up, atrial tachycardia on amiodarone, HTN, ETOH abuse, and CML not currently under treatment who presented to [facility] with a one week history of increased SOB and LE swelling with associated palpitations.   Prior echo in 01/2015 showed an EF of 15% with diffuse hypokinesis, moderate TR, PASP 45 mmHg. Most recent echo from 10/2015 showed an EF of 20-25%, diffuse hypokinesis, mild MR, mildly dilated left atrium, severely reduced RV systolic function, moderate TR, PASP 58 mmHg. He has been managed on Coreg, Entresto, digoxin, spironolactone, and torsemide as an outpatient. He has not followed up with [facility] for his CML since 03/2015. Unfortunately, he has been living in his car for the past 3 weeks and has been unable to take some of his medications. Prior BNP of 3489 in August 2017. He reports a dry weight of 165 pounds. Admission weight of 181 pounds.   He noted an episode of slurred speech the week prior while walking across [employer]. No medical work up at that time. This was associated with palpitations. No unilateral weakness or numbness. Speech is back to baseline.   He presented to [facility] on [facility]/10 with a 1 week history of palpitations, SOB, and LE swelling. CXR showed compensated CHF. BNP 2200, hgb 9.9 (baseline approximately 11), wbc 37.2, plt 991, SCr 1.45, K+ 4.2, alk phos 202, T bili 1.4, AST 54, troponin 0.04-->0.05. He was  started on IV Lasix with a UOP of 1.4 L for the admission to date. Echo is pending.     Past Medical History:  Diagnosis Date  . Bell's palsy   . CKD (chronic kidney disease), stage II   . CML (chronic myelocytic leukemia) ([place])   . Coronary artery disease   . Hypercholesterolemia   . Hypertension   . Leukemia ([place])   . Stroke ([place])    No residual limb weakness.  Walks with cane at baseline.   Marland Kitchen TIA (transient ischemic attack) 05/10/2014      Most Recent Cardiac Studies: As above   Surgical History:  Past Surgical History:  Procedure Laterality Date  . BACK SURGERY    . HIP ARTHROPLASTY Right    orif  . ORIF FOREARM FRACTURE Right      Home Meds: Prior to Admission medications   Medication Sig Start Date End Date Taking? Authorizing Provider  acetaminophen (TYLENOL) 325 MG tablet Take 2 tablets (650 mg total) by mouth every 4 (four) hours as needed for headache or mild pain. 06/06/15   [name], MD  albuterol (PROVENTIL HFA;VENTOLIN HFA) 108 (90 BASE) MCG/ACT inhaler Inhale 2 puffs into the lungs every 4 (four) hours as needed for wheezing or shortness of breath. 08/08/14   Noemi Chapel, MD  ALPRAZolam Duanne Moron) 0.25 MG tablet Take 0.25 mg by mouth 2 (two) times daily as needed for anxiety.    Historical Provider, MD  amiodarone (PACERONE) 200 MG tablet Take 1 tablet (200 mg total) by mouth daily. 12/30/15   Satira Mccallum  Tillery, PA-C  aspirin 81 MG chewable tablet Chew 1 tablet (81 mg total) by mouth daily. 09/22/14   Hosie Poisson, MD  atorvastatin (LIPITOR) 40 MG tablet Take 1 tablet (40 mg total) by mouth daily. 12/25/15   Amy D Clegg, NP  carvedilol (COREG) 3.125 MG tablet Take 1 tablet (3.125 mg total) by mouth 2 (two) times daily. 12/25/15   Amy D Ninfa Meeker, NP  clopidogrel (PLAVIX) 75 MG tablet Take 1 tablet (75 mg total) by mouth daily. 03/21/14   [name], MD  digoxin (LANOXIN) 0.125 MG tablet Take 1 tablet (0.125 mg total) by mouth daily. 01/29/15   Barton Dubois, MD   sacubitril-valsartan (ENTRESTO) 49-51 MG Take 1 tablet by mouth 2 (two) times daily. 12/30/15   Shirley Friar, PA-C  spironolactone (ALDACTONE) 25 MG tablet Take 0.5 tablets (12.5 mg total) by mouth daily. 12/17/15 03/16/16  Larey Dresser, MD  torsemide (DEMADEX) 20 MG tablet Take 80 mg by mouth daily.    Historical Provider, MD    Inpatient Medications:  . amiodarone  200 mg Oral Daily  . aspirin  81 mg Oral Daily  . atorvastatin  40 mg Oral Daily  . carvedilol  3.125 mg Oral BID  . clopidogrel  75 mg Oral Daily  . digoxin  0.125 mg Oral Daily  . docusate sodium  100 mg Oral BID  . enoxaparin (LOVENOX) injection  40 mg Subcutaneous Q24H  . furosemide  40 mg Intravenous Q12H  . insulin aspart  0-9 Units Subcutaneous TID WC  . ipratropium-albuterol  3 mL Nebulization QID  . levofloxacin  500 mg Oral Daily  . pantoprazole  40 mg Oral Daily  . sacubitril-valsartan  1 tablet Oral BID  . sodium chloride flush  3 mL Intravenous Q12H  . sodium chloride flush  3 mL Intravenous Q12H     Allergies: No Known Allergies  Social History   Social History  . Marital status: Widowed    Spouse name: N/A  . Number of children: 0  . Years of education: N/A   Occupational History  . retired    Social History Main Topics  . Smoking status: Former Smoker    Packs/day: 0.50    Years: 50.00    Types: Cigarettes  . Smokeless tobacco: Never Used  . Alcohol use 0.6 oz/week    1 Cans of beer per week     Comment: daily   . Drug use:     Types: Marijuana, Cocaine  . Sexual activity: No   Other Topics Concern  . Not on file   Social History Narrative   Patient is right handed.   Patient drinks 1-2 cups daily.     Family History  Problem Relation Age of Onset  . Diabetes Mother   . Hypertension Mother   . Diabetes Father   . Hypertension Father   . Diabetes Brother   . Hypertension Brother   . Diabetes Sister   . Hypertension Sister   . Diabetes Brother   . Hypertension  Brother   . Diabetes Sister   . Hypertension Sister      Review of Systems: Review of Systems  Constitutional: Positive for malaise/fatigue. Negative for chills, diaphoresis, fever and weight loss.  HENT: Negative for congestion.   Eyes: Negative for discharge and redness.  Respiratory: Positive for cough and shortness of breath. Negative for hemoptysis, sputum production and wheezing.   Cardiovascular: Positive for chest pain and palpitations. Negative for orthopnea, claudication, leg  swelling and PND.  Gastrointestinal: Negative for abdominal pain, blood in stool, heartburn, melena, nausea and vomiting.  Genitourinary: Negative for hematuria.  Musculoskeletal: Negative for falls and myalgias.  Skin: Negative for rash.  Neurological: Positive for weakness. Negative for dizziness, tingling, tremors, sensory change, speech change, focal weakness and loss of consciousness.  Endo/Heme/Allergies: Does not bruise/bleed easily.  Psychiatric/Behavioral: Negative for substance abuse. The patient is not nervous/anxious.   All other systems reviewed and are negative.   Labs:  Recent Labs  03/21/16 1408 03/21/16 1907 03/22/16 0021 03/22/16 0711  TROPONINI 0.06* 0.05* 0.04* 0.05*   Lab Results  Component Value Date   WBC 34.0 (H) 03/22/2016   HGB 9.9 (L) 03/22/2016   HCT 30.6 (L) 03/22/2016   MCV 98.7 03/22/2016   PLT 864 (H) 03/22/2016     Recent Labs Lab 03/22/16 0711  NA 136  K 3.7  CL 105  CO2 24  BUN 22*  CREATININE 1.41*  CALCIUM 8.6*  PROT 6.4*  BILITOT 1.4*  ALKPHOS 173*  ALT 31  AST 52*  GLUCOSE 131*   Lab Results  Component Value Date   CHOL 212 (H) 03/06/2014   HDL 57 03/06/2014   LDLCALC 131 (H) 03/06/2014   TRIG 122 03/06/2014   Lab Results  Component Value Date   DDIMER 1.95 (H) 12/03/2015    Radiology/Studies:  Dg Chest 2 View  Result Date: 03/22/2016 CLINICAL DATA:  Shortness of breath, dyspnea on exertion, lower extremity edema, and chest  pain. History of COPD, CHF, diabetes, and chronic leukemia. EXAM: CHEST  2 VIEW COMPARISON:  PA and lateral chest x-ray of March 21, 2016 FINDINGS: The lungs are well-expanded. The interstitial markings are minimally prominent. The pulmonary vascularity is prominent centrally. The cardiac silhouette is mildly enlarged but stable. There is no pleural effusion. The trachea is midline. There is calcification in the wall of the aortic arch. There is a nodule demonstrated over the right lower lung field compatible with a nipple shadow. It was not visible yesterday. IMPRESSION: Mild interstitial prominence, stable. Stable cardiomegaly. The findings are consistent with low-grade compensated CHF. Electronically Signed   By: David  Martinique M.D.   On: 03/22/2016 08:13   Dg Chest 2 View  Result Date: 03/21/2016 CLINICAL DATA:  Shortness of breath.  Productive cough. EXAM: CHEST  2 VIEW COMPARISON:  12/03/2015 chest radiograph. FINDINGS: Stable cardiomediastinal silhouette with mild cardiomegaly. No pneumothorax. No pleural effusion. Cephalization of the pulmonary vasculature without overt pulmonary edema. No acute consolidative airspace disease. IMPRESSION: Stable mild cardiomegaly without overt pulmonary edema. No acute pulmonary disease. Electronically Signed   By: Ilona Sorrel M.D.   On: 03/21/2016 14:48    EKG: Interpreted by me showed: NSR, 84 bpm, left axis deviation, prolonged QT at 505 msec, nonspecific st/t changes Telemetry: Interpreted by me showed: NSR, 70's bpm, rare episode of atrial tachycardia into the 120's bpm  Weights: Filed Weights   03/21/16 1402 03/21/16 1813 03/22/16 0443  Weight: 150 lb (68 kg) 181 lb 3.2 oz (82.2 kg) 180 lb 8 oz (81.9 kg)     Physical Exam: Blood pressure 118/65, pulse 69, temperature 97.7 F (36.5 C), temperature source Oral, resp. rate 19, height _0  (1.727 m), weight 180 lb 8 oz (81.9 kg), SpO2 97 %. Body mass index is 27.44 kg/m. General: Well developed,  well nourished, in no acute distress. Head: Normocephalic, atraumatic, sclera non-icteric, no xanthomas, nares are without discharge.  Neck: Negative for carotid bruits. JVD mildly elevated.  Lungs: Faint crackles along the bases. Breathing is unlabored. Heart: RRR with S1 S2. No murmurs, rubs, or gallops appreciated. Abdomen: Soft, non-tender, non-distended with normoactive bowel sounds. No hepatomegaly. No rebound/guarding. No obvious abdominal masses. Msk:  Strength and tone appear normal for age. Extremities: No clubbing or cyanosis. No edema. Distal pedal pulses are 2+ and equal bilaterally. Neuro: Alert and oriented X 3. No facial asymmetry. No focal deficit. Moves all extremities spontaneously. Psych:  Responds to questions appropriately with a normal affect.    Assessment and Plan:  Principal Problem:   Acute respiratory distress Active Problems:   CML (chronic myelocytic leukemia) (HCC)   Acute on chronic combined systolic and diastolic CHF (congestive heart failure) (HCC)   Homeless   Elevated troponin   Essential hypertension   Compliance poor   CKD (chronic kidney disease), stage II   Slurred speech   History of stroke    1. Acute respiratory distress with hypoxia: -Wean oxygen as able per primary team -Likely multifactorial including his CML/anemia and mild component of acute on chronic combined CHF with medication noncompliance   2. Acute on chronic combined CHF/NICM/biventricular failure: -He does not appear to be grossly volume overloaded -Continue IV Lasix for today and likely transition to PO torsemide -Compliance is critical for this patient -Continue Coreg, Entresto, digoxin, and spironolactone  -Echo pending -Needs follow up with advanced HF clinic in [place] -If he demonstrates medication compliance he may be a candidate for ICD implantation  -CHF education -Perhaps a large degree of his noncompliance is social in etiology, would benefit from Case manager  evaluation while inpatient   3. Atrial tachycardia/history of CVA x 2/slurred speech: -No evaluation for Afib on file for review -Would benefit from 30-day event monitor as an outpatient/possibly an ILR -Not on anticoagulation at this time as no evidence of Afib -On amiodarone for atrial tach per primary cardiologist  -CT head pending per IM  4. Homeless: -As above  5. CKD stage II-III: -Monitor closely   6. CML: -Not currently on treatment -Needs outpatient follow up  7. Elevated troponin: -No chest pain -Likely supply demand ischemia in the setting of the above -Echo pending as above -Previously with nonocclusive CAD in 2008 by cardia cath   Signed, Christell Faith, PA-C [place] Pager: [phone] 03/22/2016, 9:59 AM

## 2016-03-22 NOTE — Progress Notes (Signed)
Patient ID: Bruce [name], male   DOB: [birth year], 70 y.o.   MRN: [id number]  [facility] PROGRESS NOTE  Bruce [name] [id number] DOB: Jul 07, 1945 DOA: 03/21/2016 PCP: [name], MD  HPI/Subjective: Patient still with shortness of breath and cough. He states he's not been taking his medications as he should. Patient currently living in his car. Patient states these have been slurred speech going on for a while. Unable to do an MRI secondary to claustrophobia  Objective: Vitals:   03/22/16 0846 03/22/16 1210  BP: 118/65 105/60  Pulse: 69 68  Resp:  18  Temp:  98.2 F (36.8 C)    Filed Weights   03/21/16 1402 03/21/16 1813 03/22/16 0443  Weight: 68 kg (150 lb) 82.2 kg (181 lb 3.2 oz) 81.9 kg (180 lb 8 oz)    ROS: Review of Systems  Constitutional: Negative for chills and fever.  Eyes: Negative for blurred vision.  Respiratory: Positive for cough and shortness of breath.   Cardiovascular: Negative for chest pain.  Gastrointestinal: Negative for abdominal pain, constipation, diarrhea, nausea and vomiting.  Genitourinary: Negative for dysuria.  Musculoskeletal: Negative for joint pain.  Neurological: Negative for dizziness and headaches.   Exam: Physical Exam  Constitutional: He is oriented to person, place, and time.  HENT:  Nose: No mucosal edema.  Mouth/Throat: No oropharyngeal exudate or posterior oropharyngeal edema.  Eyes: Conjunctivae, EOM and lids are normal. Pupils are equal, round, and reactive to light.  Neck: No JVD present. Carotid bruit is not present. No edema present. No thyroid mass and no thyromegaly present.  Cardiovascular: S1 normal and S2 normal.  Exam reveals no gallop.   No murmur heard. Pulses:      Dorsalis pedis pulses are 2+ on the right side, and 2+ on the left side.  Respiratory: No respiratory distress. He has no wheezes. He has no rhonchi. He has rales in the right lower field and the left lower field.  GI: Soft. Bowel  sounds are normal. There is no tenderness.  Musculoskeletal:       Right ankle: He exhibits no swelling.       Left ankle: He exhibits no swelling.  Lymphadenopathy:    He has no cervical adenopathy.  Neurological: He is alert and oriented to person, place, and time.  Intermittent slurred speech.  Skin: Skin is warm. No rash noted. Nails show no clubbing.  Psychiatric: He has a normal mood and affect.      Data Reviewed: Basic Metabolic Panel:  Recent Labs Lab 03/21/16 1408 03/22/16 0711  NA 138 136  K 4.2 3.7  CL 105 105  CO2 24 24  GLUCOSE 142* 131*  BUN 25* 22*  CREATININE 1.45* 1.41*  CALCIUM 9.0 8.6*   Liver Function Tests:  Recent Labs Lab 03/21/16 1408 03/22/16 0711  AST 54* 52*  ALT 33 31  ALKPHOS 202* 173*  BILITOT 1.4* 1.4*  PROT 7.5 6.4*  ALBUMIN 3.7 3.3*   CBC:  Recent Labs Lab 03/21/16 1408 03/22/16 0711  WBC 37.2* 34.0*  HGB 11.0* 9.9*  HCT 34.0* 30.6*  MCV 101.0* 98.7  PLT 991* 864*   Cardiac Enzymes:  Recent Labs Lab 03/21/16 1408 03/21/16 1907 03/22/16 0021 03/22/16 0711  TROPONINI 0.06* 0.05* 0.04* 0.05*   BNP (last 3 results)  Recent Labs  12/24/15 1330 12/30/15 1055 03/21/16 1408  BNP 3,884.4* 3,310.3* 2,222.0*     CBG:  Recent Labs Lab 03/21/16 2243 03/22/16 0751 03/22/16 1206  GLUCAP 136*  127* 147*    Recent Results (from the past 240 hour(s))  Blood culture (routine x 2)     Status: None (Preliminary result)   Collection Time: 03/21/16  4:31 PM  Result Value Ref Range Status   Specimen Description BLOOD RIGHT ASSIST CONTROL  Final   Special Requests BOTTLES DRAWN AEROBIC AND ANAEROBIC 13CC  Final   Culture NO GROWTH < 24 HOURS  Final   Report Status PENDING  Incomplete  Blood culture (routine x 2)     Status: None (Preliminary result)   Collection Time: 03/21/16  4:31 PM  Result Value Ref Range Status   Specimen Description BLOOD RIGHT FOREARM  Final   Special Requests BOTTLES DRAWN AEROBIC AND  ANAEROBIC 11CC  Final   Culture NO GROWTH < 24 HOURS  Final   Report Status PENDING  Incomplete     Studies: Dg Chest 2 View  Result Date: 03/22/2016 CLINICAL DATA:  Shortness of breath, dyspnea on exertion, lower extremity edema, and chest pain. History of COPD, CHF, diabetes, and chronic leukemia. EXAM: CHEST  2 VIEW COMPARISON:  PA and lateral chest x-ray of March 21, 2016 FINDINGS: The lungs are well-expanded. The interstitial markings are minimally prominent. The pulmonary vascularity is prominent centrally. The cardiac silhouette is mildly enlarged but stable. There is no pleural effusion. The trachea is midline. There is calcification in the wall of the aortic arch. There is a nodule demonstrated over the right lower lung field compatible with a nipple shadow. It was not visible yesterday. IMPRESSION: Mild interstitial prominence, stable. Stable cardiomegaly. The findings are consistent with low-grade compensated CHF. Electronically Signed   By: David  Martinique M.D.   On: 03/22/2016 08:13   Dg Chest 2 View  Result Date: 03/21/2016 CLINICAL DATA:  Shortness of breath.  Productive cough. EXAM: CHEST  2 VIEW COMPARISON:  12/03/2015 chest radiograph. FINDINGS: Stable cardiomediastinal silhouette with mild cardiomegaly. No pneumothorax. No pleural effusion. Cephalization of the pulmonary vasculature without overt pulmonary edema. No acute consolidative airspace disease. IMPRESSION: Stable mild cardiomegaly without overt pulmonary edema. No acute pulmonary disease. Electronically Signed   By: Ilona Sorrel M.D.   On: 03/21/2016 14:48   Ct Head Wo Contrast  Result Date: 03/22/2016 CLINICAL DATA:  Slurred speech and left-sided weakness for 3 days. History of stroke and leukemia. EXAM: CT HEAD WITHOUT CONTRAST TECHNIQUE: Contiguous axial images were obtained from the base of the skull through the vertex without intravenous contrast. COMPARISON:  09/05/2015 FINDINGS: Brain: There is no evidence of  acute cortical infarct, intracranial hemorrhage, mass, midline shift, or extra-axial fluid collection. Mild generalized cerebral atrophy is unchanged and within normal limits for age. Presumed tiny chronic left thalamic lacunar infarct is unchanged, better demonstrated on the prior study. Vascular: Calcified atherosclerosis at the skullbase. No hyperdense vessel. Skull: No acute abnormality or destructive osseous lesion. Sinuses/Orbits: Depression of the right greater than left lamina papyracea is unchanged and may reflect remote medial orbital blowout fractures. No significant inflammatory disease in the visualized paranasal sinuses are mastoid air cells. Other: None. IMPRESSION: No evidence of acute intracranial abnormality. Electronically Signed   By: Logan Bores M.D.   On: 03/22/2016 12:18    Scheduled Meds: . amiodarone  200 mg Oral Daily  . aspirin  81 mg Oral Daily  . atorvastatin  40 mg Oral Daily  . carvedilol  3.125 mg Oral BID  . clopidogrel  75 mg Oral Daily  . digoxin  0.125 mg Oral Daily  . docusate  sodium  100 mg Oral BID  . enoxaparin (LOVENOX) injection  40 mg Subcutaneous Q24H  . furosemide  40 mg Intravenous Q12H  . insulin aspart  0-9 Units Subcutaneous TID WC  . ipratropium-albuterol  3 mL Nebulization QID  . pantoprazole  40 mg Oral Daily  . sacubitril-valsartan  1 tablet Oral BID  . sodium chloride flush  3 mL Intravenous Q12H  . sodium chloride flush  3 mL Intravenous Q12H    Assessment/Plan:  1. Acute on chronic systolic congestive heart failure. Patient on Entresto, Coreg, IV Lasix. 2. Slurred speech with History of stroke and TIA on aspirin and Plavix. CT scan of the head ordered by me and is negative. Patient to claustrophobic for MRI of the brain. 3. CML. Follow-up with oncology as outpatient 4. Chronic kidney disease stage III watch with diuresis. 5. Hyperlipidemia unspecified on atorvastatin 6. Care manager consultation about medications and living  situation  Code Status:     Code Status Orders        Start     Ordered   03/21/16 1841  Full code  Continuous     03/21/16 1840    Code Status History    Date Active Date Inactive Code Status Order ID Comments User Context   10/10/2015 11:59 PM 10/15/2015  7:08 PM Full Code [id number]  Ivor Costa, MD ED   06/04/2015  1:54 AM 06/06/2015  5:28 PM Full Code [id number]  [name], MD ED   01/25/2015  3:16 AM 01/29/2015  5:17 PM Full Code [id number]  Lavina Hamman, MD ED   09/13/2014  5:07 PM 09/22/2014  6:09 PM Full Code [id number]  Flonnie Overman Dhungel, MD Inpatient   03/20/2014 11:33 PM 03/21/2014  5:46 PM Full Code [id number]  Arne Cleveland, MD Inpatient   03/18/2014 11:46 PM 03/20/2014 11:33 PM Full Code [id number]  [name], MD Inpatient   03/05/2014  9:04 PM 03/06/2014  6:57 PM Full Code [id number]  [name], MD Inpatient     Disposition Plan: Home in next couple days  Consultants:  Cardiology  Time spent: 25 minutes  [facility]

## 2016-03-22 NOTE — Care Management Note (Addendum)
Case Management Note  Patient Details  Name: Bruce [name] MRN: [id number] Date of Birth: May 10, 1945  Subjective/Objective:       Bruce [name] reports that he is living in his car and not taking his medications as prescribed. He was admitted per shortness of breath and chest pain. Bruce [name] is receiving [name] CT scan today per slurred speech. He has [name] hx of CML, Chronic Myelocytic Leukemia. This Probation officer requested to [place] that Bruce [name] has housing issues.               Action/Plan:    Expected Discharge Date:  03/23/16               Expected Discharge Plan:     In-House Referral:     Discharge planning Services     Post Acute Care Choice:    Choice offered to:     DME Arranged:    DME Agency:     HH Arranged:    HH Agency:     Status of Service:     If discussed at [employer], dates discussed:    Additional Comments:  Bruce Kafer A, RN 03/22/2016, 10:10 AM

## 2016-03-23 DIAGNOSIS — R0603 Acute respiratory distress: Principal | ICD-10-CM

## 2016-03-23 LAB — GLUCOSE, CAPILLARY
Glucose-Capillary: 114 mg/dL — ABNORMAL HIGH (ref 65–99)
Glucose-Capillary: 205 mg/dL — ABNORMAL HIGH (ref 65–99)

## 2016-03-23 LAB — ECHOCARDIOGRAM COMPLETE
Height: 68 in
Weight: 2888 oz

## 2016-03-23 LAB — BASIC METABOLIC PANEL
Anion gap: 7 (ref 5–15)
BUN: 23 mg/dL — ABNORMAL HIGH (ref 6–20)
CO2: 25 mmol/L (ref 22–32)
Calcium: 8.9 mg/dL (ref 8.9–10.3)
Chloride: 105 mmol/L (ref 101–111)
Creatinine, Ser: 1.37 mg/dL — ABNORMAL HIGH (ref 0.61–1.24)
GFR, EST AFRICAN AMERICAN: 59 mL/min — AB (ref >60)
GFR, EST NON AFRICAN AMERICAN: 51 mL/min — AB (ref >60)
GLUCOSE: 110 mg/dL — AB (ref 65–99)
POTASSIUM: 3.7 mmol/L (ref 3.5–5.1)
Sodium: 137 mmol/L (ref 135–145)

## 2016-03-23 MED ORDER — TORSEMIDE 20 MG PO TABS: 80.0000 mg | ORAL_TABLET | Freq: Every day | ORAL | 0 refills | Status: DC

## 2016-03-23 MED ORDER — TORSEMIDE 20 MG PO TABS: 80.0000 mg | ORAL_TABLET | Freq: Every day | ORAL | Status: DC

## 2016-03-23 MED ORDER — ASPIRIN 81 MG PO CHEW: 81.0000 mg | CHEWABLE_TABLET | Freq: Every day | ORAL | 0 refills | Status: DC

## 2016-03-23 MED ORDER — SPIRONOLACTONE 25 MG PO TABS: 12.5000 mg | ORAL_TABLET | Freq: Every day | ORAL | 3 refills | Status: DC

## 2016-03-23 NOTE — Progress Notes (Signed)
Patient Name: Bruce [name] Date of Encounter: 03/23/2016  Primary Cardiologist: [facility] Problem List     Principal Problem:   Acute respiratory distress Active Problems:   CML (chronic myelocytic leukemia) (HCC)   Acute on chronic combined systolic and diastolic CHF (congestive heart failure) (HCC)   Homeless   Elevated troponin   Essential hypertension   Compliance poor   CKD (chronic kidney disease), stage II   Slurred speech   History of stroke     Subjective   Breathing better this morning. UOP of 1 L for the admission to date. Echo on 12/11 showed persistently reduced EF at 15%. Remaining details below. Renal function stable. Care management has been consulted on for possible assistance with his living situation as he is currently homeless and needs assistance with his medications.   Inpatient Medications    Scheduled Meds: . amiodarone  200 mg Oral Daily  . aspirin  81 mg Oral Daily  . atorvastatin  40 mg Oral Daily  . carvedilol  3.125 mg Oral BID  . clopidogrel  75 mg Oral Daily  . digoxin  0.125 mg Oral Daily  . docusate sodium  100 mg Oral BID  . enoxaparin (LOVENOX) injection  40 mg Subcutaneous Q24H  . furosemide  40 mg Intravenous Q12H  . insulin aspart  0-9 Units Subcutaneous TID WC  . ipratropium-albuterol  3 mL Nebulization QID  . pantoprazole  40 mg Oral Daily  . sacubitril-valsartan  1 tablet Oral BID  . sodium chloride flush  3 mL Intravenous Q12H  . sodium chloride flush  3 mL Intravenous Q12H   Continuous Infusions:  PRN Meds: sodium chloride, acetaminophen **OR** acetaminophen, albuterol, ALPRAZolam, bisacodyl, guaiFENesin-dextromethorphan, ondansetron **OR** ondansetron (ZOFRAN) IV, sodium chloride flush   Vital Signs    Vitals:   03/22/16 2059 03/23/16 0422 03/23/16 0817 03/23/16 0946  BP:  115/70  (!) 115/54  Pulse:  70  70  Resp:  18    Temp:  97.8 F (36.6 C)    TempSrc:  Oral    SpO2: 98% 99% 95%   Weight:  173  lb 3.2 oz (78.6 kg)    Height:        Intake/Output Summary (Last 24 hours) at 03/23/16 1012 Last data filed at 03/23/16 1007  Gross per 24 hour  Intake              963 ml  Output              550 ml  Net              413 ml   Filed Weights   03/21/16 1813 03/22/16 0443 03/23/16 0422  Weight: 181 lb 3.2 oz (82.2 kg) 180 lb 8 oz (81.9 kg) 173 lb 3.2 oz (78.6 kg)    Physical Exam    GEN: Frail appearing, in no acute distress.  HEENT: Grossly normal.  Neck: Supple, mild JVD, no carotid bruits, or masses. Cardiac: RRR, no murmurs, rubs, or gallops. No clubbing, cyanosis, edema.  Radials/DP/PT 2+ and equal bilaterally.  Respiratory:  Improved breath sounds bilaterally. GI: Soft, nontender, nondistended, BS + x 4. MS: no deformity or atrophy. Skin: warm and dry, no rash. Neuro:  Strength and sensation are intact. Psych: AAOx3.  Normal affect.  Labs    CBC  Recent Labs  03/21/16 1408 03/22/16 0711  WBC 37.2* 34.0*  HGB 11.0* 9.9*  HCT 34.0* 30.6*  MCV 101.0* 98.7  PLT 991* 123456*   Basic Metabolic Panel  Recent Labs  03/22/16 0711 03/23/16 0443  NA 136 137  K 3.7 3.7  CL 105 105  CO2 24 25  GLUCOSE 131* 110*  BUN 22* 23*  CREATININE 1.41* 1.37*  CALCIUM 8.6* 8.9   Liver Function Tests  Recent Labs  03/21/16 1408 03/22/16 0711  AST 54* 52*  ALT 33 31  ALKPHOS 202* 173*  BILITOT 1.4* 1.4*  PROT 7.5 6.4*  ALBUMIN 3.7 3.3*   No results for input(s): LIPASE, AMYLASE in the last 72 hours. Cardiac Enzymes  Recent Labs  03/21/16 1907 03/22/16 0021 03/22/16 0711  TROPONINI 0.05* 0.04* 0.05*   BNP Invalid input(s): POCBNP D-Dimer No results for input(s): DDIMER in the last 72 hours. Hemoglobin A1C No results for input(s): HGBA1C in the last 72 hours. Fasting Lipid Panel No results for input(s): CHOL, HDL, LDLCALC, TRIG, CHOLHDL, LDLDIRECT in the last 72 hours. Thyroid Function Tests No results for input(s): TSH, T4TOTAL, T3FREE, THYROIDAB in the  last 72 hours.  Invalid input(s): FREET3  Telemetry    NSR, 80's bpm - Personally Reviewed  ECG    n/a - Personally Reviewed  Radiology    Dg Chest 2 View  Result Date: 03/22/2016 CLINICAL DATA:  Shortness of breath, dyspnea on exertion, lower extremity edema, and chest pain. History of COPD, CHF, diabetes, and chronic leukemia. EXAM: CHEST  2 VIEW COMPARISON:  PA and lateral chest x-ray of March 21, 2016 FINDINGS: The lungs are well-expanded. The interstitial markings are minimally prominent. The pulmonary vascularity is prominent centrally. The cardiac silhouette is mildly enlarged but stable. There is no pleural effusion. The trachea is midline. There is calcification in the wall of the aortic arch. There is a nodule demonstrated over the right lower lung field compatible with a nipple shadow. It was not visible yesterday. IMPRESSION: Mild interstitial prominence, stable. Stable cardiomegaly. The findings are consistent with low-grade compensated CHF. Electronically Signed   By: David  Martinique M.D.   On: 03/22/2016 08:13   Dg Chest 2 View  Result Date: 03/21/2016 CLINICAL DATA:  Shortness of breath.  Productive cough. EXAM: CHEST  2 VIEW COMPARISON:  12/03/2015 chest radiograph. FINDINGS: Stable cardiomediastinal silhouette with mild cardiomegaly. No pneumothorax. No pleural effusion. Cephalization of the pulmonary vasculature without overt pulmonary edema. No acute consolidative airspace disease. IMPRESSION: Stable mild cardiomegaly without overt pulmonary edema. No acute pulmonary disease. Electronically Signed   By: Ilona Sorrel M.D.   On: 03/21/2016 14:48   Ct Head Wo Contrast  Result Date: 03/22/2016 CLINICAL DATA:  Slurred speech and left-sided weakness for 3 days. History of stroke and leukemia. EXAM: CT HEAD WITHOUT CONTRAST TECHNIQUE: Contiguous axial images were obtained from the base of the skull through the vertex without intravenous contrast. COMPARISON:  09/05/2015  FINDINGS: Brain: There is no evidence of acute cortical infarct, intracranial hemorrhage, mass, midline shift, or extra-axial fluid collection. Mild generalized cerebral atrophy is unchanged and within normal limits for age. Presumed tiny chronic left thalamic lacunar infarct is unchanged, better demonstrated on the prior study. Vascular: Calcified atherosclerosis at the skullbase. No hyperdense vessel. Skull: No acute abnormality or destructive osseous lesion. Sinuses/Orbits: Depression of the right greater than left lamina papyracea is unchanged and may reflect remote medial orbital blowout fractures. No significant inflammatory disease in the visualized paranasal sinuses are mastoid air cells. Other: None. IMPRESSION: No evidence of acute intracranial abnormality. Electronically Signed   By: Logan Bores M.D.   On:  03/22/2016 12:18    Cardiac Studies   Echo 03/23/2016: Study Conclusions  - Left ventricle: The cavity size was mildly dilated. Wall   thickness was normal. Systolic function was severely reduced. The   estimated ejection fraction was in the range of 15% to 20%.   Diffuse hypokinesis. The study is not technically sufficient to   allow evaluation of LV diastolic function. - Mitral valve: There was mild regurgitation. - Left atrium: The atrium was mildly to moderately dilated. - Right ventricle: The cavity size was mildly dilated. Wall   thickness was normal. Systolic function was moderately reduced. - Right atrium: The atrium was moderately dilated. - Tricuspid valve: Mildly thickened leaflets. There was   moderate-severe regurgitation. - Pulmonic valve: There was moderate regurgitation. - Pulmonary arteries: Systolic pressure was moderately increased.   PA peak pressure: 50 mm Hg (S).  Patient Profile     70 y.o. male with history of chronic combined CHF class III, NICM with cardiac cath in 2008 showing nonobstructive CAD, homeless x 3 weeks living in his car, CKD stage  II-III, medication noncompliance, CVA x 2 without Afib work up, atrial tachycardia on amiodarone, HTN, ETOH abuse, and CML not currently under treatment who presented to [facility] with a one week history of increased SOB and LE swelling with associated palpitations  Assessment & Plan    1. Acute respiratory distress with hypoxia: -On room air -Likely multifactorial including his CML/anemia and mild component of acute on chronic combined CHF with medication noncompliance   2. Acute on chronic combined CHF/NICM/biventricular failure: -He does not appear to be grossly volume overloaded -Improved -Change IV Lasix to PO torsemide 80 mg daily (home dose), though was not taking this as an outpatient -Compliance is critical for this patient -Continue Coreg, Entresto, digoxin, and spironolactone  -If he cannot obtain Entresto given his living situation would change to ACEi after washout period. Will need care management's assistance in determining the most affordable HF regimen for him. This consult has been placed -Echo as above -Needs follow up with advanced HF clinic in [place] -If he demonstrates medication compliance he may be a candidate for ICD implantation  -CHF education -Exacerbation likely in the setting of unable to obtain medications, homelessness, and noncompliance   3. Atrial tachycardia/history of CVA x 2/slurred speech: -No evaluation for Afib on file for review -Would benefit from 30-day event monitor as an outpatient/possibly an ILR -Not on anticoagulation at this time as no evidence of Afib -On amiodarone for atrial tach per primary cardiologist  -CT negative -Unable to perform MRI brain 2/2 claustrophobia   4. Homeless: -As above  5. CKD stage II-III: -Monitor closely   6. CML: -Not currently on treatment -Needs outpatient follow up  7. Elevated troponin: -No chest pain -Likely supply demand ischemia in the setting of the above -Echo as above -Previously with  nonocclusive CAD in 2008 by cardiac cath -May need outpatient stress test   Signed, Christell Faith, PA-C [place] Pager: [phone] 03/23/2016, 10:12 AM

## 2016-03-23 NOTE — Clinical Social Work Note (Signed)
MSW received consult that patient needing some assistance regarding housing and shelters.  MSW gave patient information regarding [facility] and also information about boarding houses.  Patient was appreciative of help provided, patient does not have any other social work needs, MSW to sign off.  Jones Broom. Izabellah Dadisman, MSW [phone]  Mon-Fri 8a-4:30p 03/23/2016 1:17 PM

## 2016-03-23 NOTE — Discharge Instructions (Signed)
Heart Failure  Heart failure means your heart has trouble pumping blood. This makes it hard for your body to work well. Heart failure is usually a long-term (chronic) condition. You must take good care of yourself and follow your doctor's treatment plan.  HOME CARE   Take your heart medicine as told by your doctor.    Do not stop taking medicine unless your doctor tells you to.    Do not skip any dose of medicine.    Refill your medicines before they run out.    Take other medicines only as told by your doctor or pharmacist.   Stay active if told by your doctor. The elderly and people with severe heart failure should talk with a doctor about physical activity.   Eat heart-healthy foods. Choose foods that are without trans fat and are low in saturated fat, cholesterol, and salt (sodium). This includes fresh or frozen fruits and vegetables, fish, lean meats, fat-free or low-fat dairy foods, whole grains, and high-fiber foods. Lentils and dried peas and beans (legumes) are also good choices.   Limit salt if told by your doctor.   Cook in a healthy way. Roast, grill, broil, bake, poach, steam, or stir-fry foods.   Limit fluids as told by your doctor.   Weigh yourself every morning. Do this after you pee (urinate) and before you eat breakfast. Write down your weight to give to your doctor.   Take your blood pressure and write it down if your doctor tells you to.   Ask your doctor how to check your pulse. Check your pulse as told.   Lose weight if told by your doctor.   Stop smoking or chewing tobacco. Do not use gum or patches that help you quit without your doctor's approval.   Schedule and go to doctor visits as told.   Nonpregnant women should have no more than 1 drink a day. Men should have no more than 2 drinks a day. Talk to your doctor about drinking alcohol.   Stop illegal drug use.   Stay current with shots (immunizations).   Manage your health conditions as told by your doctor.   Learn to  manage your stress.   Rest when you are tired.   If it is really hot outside:    Avoid intense activities.    Use air conditioning or fans, or get in a cooler place.    Avoid caffeine and alcohol.    Wear loose-fitting, lightweight, and light-colored clothing.   If it is really cold outside:    Avoid intense activities.    Layer your clothing.    Wear mittens or gloves, a hat, and a scarf when going outside.    Avoid alcohol.   Learn about heart failure and get support as needed.   Get help to maintain or improve your quality of life and your ability to care for yourself as needed.  GET HELP IF:    You gain weight quickly.   You are more short of breath than usual.   You cannot do your normal activities.   You tire easily.   You cough more than normal, especially with activity.   You have any or more puffiness (swelling) in areas such as your hands, feet, ankles, or belly (abdomen).   You cannot sleep because it is hard to breathe.   You feel like your heart is beating fast (palpitations).   You get dizzy or light-headed when you stand up.  GET HELP   RIGHT AWAY IF:    You have trouble breathing.   There is a change in mental status, such as becoming less alert or not being able to focus.   You have chest pain or discomfort.   You faint.  MAKE SURE YOU:    Understand these instructions.   Will watch your condition.   Will get help right away if you are not doing well or get worse.     This information is not intended to replace advice given to you by your health care provider. Make sure you discuss any questions you have with your health care provider.     Document Released: 01/06/2008 Document Revised: 04/19/2014 Document Reviewed: 05/15/2012  Elsevier Interactive Patient Education 2017 [employer]

## 2016-03-23 NOTE — Progress Notes (Signed)
Received a referral for this patient. He is currently being followed at the [facility] in [place]. Nurse navigator, Carole Binning, advised of this patient's admission.

## 2016-03-23 NOTE — Discharge Summary (Signed)
[facility] NAME: Bruce [name]    MR#:  [id number]  DATE OF BIRTH:  08-11-1945  DATE OF ADMISSION:  03/21/2016 ADMITTING PHYSICIAN: Idelle Crouch, MD  DATE OF DISCHARGE: 03/23/2016  PRIMARY CARE PHYSICIAN: [name], MD    ADMISSION DIAGNOSIS:  Cough [R05] CHF (congestive heart failure) (HCC) [I50.9] NSTEMI (non-ST elevated myocardial infarction) ([place]) [I21.4] Chronic lymphocytic leukemia of B-cell type not having achieved remission (HCC) [C91.10] Acute on chronic congestive heart failure, unspecified congestive heart failure type ([place]) [I50.9]  DISCHARGE DIAGNOSIS:  Principal Problem:   Acute respiratory distress Active Problems:   CML (chronic myelocytic leukemia) (HCC)   Elevated troponin   Acute on chronic combined systolic and diastolic CHF (congestive heart failure) (HCC)   Essential hypertension   Compliance poor   CKD (chronic kidney disease), stage II   Homeless   Slurred speech   History of stroke   SECONDARY DIAGNOSIS:   Past Medical History:  Diagnosis Date  . Bell's palsy   . CKD (chronic kidney disease), stage II   . CML (chronic myelocytic leukemia) ([place])   . Coronary artery disease   . Hypercholesterolemia   . Hypertension   . Leukemia ([place])   . Stroke ([place])    No residual limb weakness.  Walks with cane at baseline.   Marland Kitchen TIA (transient ischemic attack) 05/10/2014    HOSPITAL COURSE:   1. Acute on chronic systolic congestive heart failure. Patient with severe cardiomyopathy with an EF of 15-20%. The patient was diuresis with IV Lasix and was doing much better upon discharge home. Cardiology recommended switching over to 80 mg of torsemide on a daily basis. Patient already on entresto, Coreg and Aldactone prescribed.  Patient states that he has all his medications he just has not been taking them as directed.  I did prescribe aspirin, Aldactone and increase dose of torsemide. 2. Slurred  speech with history of stroke and TIA on aspirin and Plavix. Patient refused an MRI of the brain yesterday. CT scan of the head is negative. 3. History of CML follow-up with oncology as outpatient 4. Chronic kidney disease stage III watch closely as outpatient with diuresis 5. Hyperlipidemia unspecified on atorvastatin 6. Patient currently lives in his car. Care manager to follow up with him  DISCHARGE CONDITIONS:   Satisfactory  CONSULTS OBTAINED:  Treatment Team:  Wellington Hampshire, MD  DRUG ALLERGIES:  No Known Allergies  DISCHARGE MEDICATIONS:   Current Discharge Medication List    CONTINUE these medications which have CHANGED   Details  aspirin 81 MG chewable tablet Chew 1 tablet (81 mg total) by mouth daily. Qty: 30 tablet, Refills: 0    spironolactone (ALDACTONE) 25 MG tablet Take 0.5 tablets (12.5 mg total) by mouth daily. Qty: 15 tablet, Refills: 3    torsemide (DEMADEX) 20 MG tablet Take 4 tablets (80 mg total) by mouth daily. Qty: 120 tablet, Refills: 0      CONTINUE these medications which have NOT CHANGED   Details  acetaminophen (TYLENOL) 325 MG tablet Take 2 tablets (650 mg total) by mouth every 4 (four) hours as needed for headache or mild pain. Qty: 60 tablet, Refills: 3    albuterol (PROVENTIL HFA;VENTOLIN HFA) 108 (90 BASE) MCG/ACT inhaler Inhale 2 puffs into the lungs every 4 (four) hours as needed for wheezing or shortness of breath. Qty: 1 Inhaler, Refills: 3    ALPRAZolam (XANAX) 0.25 MG tablet Take 0.25 mg by mouth  2 (two) times daily as needed for anxiety.    amiodarone (PACERONE) 200 MG tablet Take 1 tablet (200 mg total) by mouth daily. Qty: 30 tablet, Refills: 5    atorvastatin (LIPITOR) 40 MG tablet Take 1 tablet (40 mg total) by mouth daily. Qty: 30 tablet, Refills: 5    carvedilol (COREG) 3.125 MG tablet Take 1 tablet (3.125 mg total) by mouth 2 (two) times daily. Qty: 60 tablet, Refills: 5    clopidogrel (PLAVIX) 75 MG tablet Take 1  tablet (75 mg total) by mouth daily. Qty: 30 tablet, Refills: 3    digoxin (LANOXIN) 0.125 MG tablet Take 1 tablet (0.125 mg total) by mouth daily. Qty: 30 tablet, Refills: 1    sacubitril-valsartan (ENTRESTO) 49-51 MG Take 1 tablet by mouth 2 (two) times daily. Qty: 60 tablet, Refills: 6         DISCHARGE INSTRUCTIONS:   Follow-up with PMD one week Follow-up with Dr. Fletcher Anon 1 week Follow-up with CHF clinic  If you experience worsening of your admission symptoms, develop shortness of breath, life threatening emergency, suicidal or homicidal thoughts you must seek medical attention immediately by calling 911 or calling your MD immediately  if symptoms less severe.  You Must read complete instructions/literature along with all the possible adverse reactions/side effects for all the Medicines you take and that have been prescribed to you. Take any new Medicines after you have completely understood and accept all the possible adverse reactions/side effects.   Please note  You were cared for by a hospitalist during your hospital stay. If you have any questions about your discharge medications or the care you received while you were in the hospital after you are discharged, you can call the unit and asked to speak with the hospitalist on call if the hospitalist that took care of you is not available. Once you are discharged, your primary care physician will handle any further medical issues. Please note that NO REFILLS for any discharge medications will be authorized once you are discharged, as it is imperative that you return to your primary care physician (or establish a relationship with a primary care physician if you do not have one) for your aftercare needs so that they can reassess your need for medications and monitor your lab values.    Today   CHIEF COMPLAINT:   Chief Complaint  Patient presents with  . Shortness of Breath  . Joint Swelling    HISTORY OF PRESENT ILLNESS:   Bruce [name]  is a 70 y.o. male presented with shortness of breath and found to be in congestive heart failure   VITAL SIGNS:  Blood pressure (!) 106/53, pulse 62, temperature 97.9 F (36.6 C), temperature source Oral, resp. rate 17, height 5\' 8"  (1.727 m), weight 78.6 kg (173 lb 3.2 oz), SpO2 94 %.  I/O:   Intake/Output Summary (Last 24 hours) at 03/23/16 1448 Last data filed at 03/23/16 1438  Gross per 24 hour  Intake              723 ml  Output              550 ml  Net              173 ml    PHYSICAL EXAMINATION:  GENERAL:  70 y.o.-year-old patient lying in the bed with no acute distress.  EYES: Pupils equal, round, reactive to light and accommodation. No scleral icterus. Extraocular muscles intact.  HEENT: Head atraumatic, normocephalic. Oropharynx  and nasopharynx clear.  NECK:  Supple, no jugular venous distention. No thyroid enlargement, no tenderness.  LUNGS: Decreased breath sounds bilateral bases, no wheezing, rales,rhonchi or crepitation. No use of accessory muscles of respiration.  CARDIOVASCULAR: S1, S2 normal. No murmurs, rubs, or gallops.  ABDOMEN: Soft, non-tender, non-distended. Bowel sounds present. No organomegaly or mass.  EXTREMITIES: No pedal edema, cyanosis, or clubbing.  NEUROLOGIC: Cranial nerves II through XII are intact. Muscle strength 5/5 in all extremities. Sensation intact. Gait not checked.  PSYCHIATRIC: The patient is alert and oriented x 3.  SKIN: No obvious rash, lesion, or ulcer.   DATA REVIEW:   CBC  Recent Labs Lab 03/22/16 0711  WBC 34.0*  HGB 9.9*  HCT 30.6*  PLT 864*    Chemistries   Recent Labs Lab 03/22/16 0711 03/23/16 0443  NA 136 137  K 3.7 3.7  CL 105 105  CO2 24 25  GLUCOSE 131* 110*  BUN 22* 23*  CREATININE 1.41* 1.37*  CALCIUM 8.6* 8.9  AST 52*  --   ALT 31  --   ALKPHOS 173*  --   BILITOT 1.4*  --     Cardiac Enzymes  Recent Labs Lab 03/22/16 0711  TROPONINI 0.05*    Microbiology Results   Results for orders placed or performed during the hospital encounter of 03/21/16  Blood culture (routine x 2)     Status: None (Preliminary result)   Collection Time: 03/21/16  4:31 PM  Result Value Ref Range Status   Specimen Description BLOOD RIGHT ASSIST CONTROL  Final   Special Requests BOTTLES DRAWN AEROBIC AND ANAEROBIC 13CC  Final   Culture NO GROWTH 2 DAYS  Final   Report Status PENDING  Incomplete  Blood culture (routine x 2)     Status: None (Preliminary result)   Collection Time: 03/21/16  4:31 PM  Result Value Ref Range Status   Specimen Description BLOOD RIGHT FOREARM  Final   Special Requests BOTTLES DRAWN AEROBIC AND ANAEROBIC 11CC  Final   Culture NO GROWTH 2 DAYS  Final   Report Status PENDING  Incomplete    RADIOLOGY:  Dg Chest 2 View  Result Date: 03/22/2016 CLINICAL DATA:  Shortness of breath, dyspnea on exertion, lower extremity edema, and chest pain. History of COPD, CHF, diabetes, and chronic leukemia. EXAM: CHEST  2 VIEW COMPARISON:  PA and lateral chest x-ray of March 21, 2016 FINDINGS: The lungs are well-expanded. The interstitial markings are minimally prominent. The pulmonary vascularity is prominent centrally. The cardiac silhouette is mildly enlarged but stable. There is no pleural effusion. The trachea is midline. There is calcification in the wall of the aortic arch. There is a nodule demonstrated over the right lower lung field compatible with a nipple shadow. It was not visible yesterday. IMPRESSION: Mild interstitial prominence, stable. Stable cardiomegaly. The findings are consistent with low-grade compensated CHF. Electronically Signed   By: David  Martinique M.D.   On: 03/22/2016 08:13   Ct Head Wo Contrast  Result Date: 03/22/2016 CLINICAL DATA:  Slurred speech and left-sided weakness for 3 days. History of stroke and leukemia. EXAM: CT HEAD WITHOUT CONTRAST TECHNIQUE: Contiguous axial images were obtained from the base of the skull through the vertex  without intravenous contrast. COMPARISON:  09/05/2015 FINDINGS: Brain: There is no evidence of acute cortical infarct, intracranial hemorrhage, mass, midline shift, or extra-axial fluid collection. Mild generalized cerebral atrophy is unchanged and within normal limits for age. Presumed tiny chronic left thalamic lacunar infarct is unchanged,  better demonstrated on the prior study. Vascular: Calcified atherosclerosis at the skullbase. No hyperdense vessel. Skull: No acute abnormality or destructive osseous lesion. Sinuses/Orbits: Depression of the right greater than left lamina papyracea is unchanged and may reflect remote medial orbital blowout fractures. No significant inflammatory disease in the visualized paranasal sinuses are mastoid air cells. Other: None. IMPRESSION: No evidence of acute intracranial abnormality. Electronically Signed   By: Logan Bores M.D.   On: 03/22/2016 12:18    Management plans discussed with the patient, and he is in agreement.  CODE STATUS:     Code Status Orders        Start     Ordered   03/21/16 1841  Full code  Continuous     03/21/16 1840    Code Status History    Date Active Date Inactive Code Status Order ID Comments User Context   10/10/2015 11:59 PM 10/15/2015  7:08 PM Full Code [id number]  Ivor Costa, MD ED   06/04/2015  1:54 AM 06/06/2015  5:28 PM Full Code [id number]  [name], MD ED   01/25/2015  3:16 AM 01/29/2015  5:17 PM Full Code [id number]  Lavina Hamman, MD ED   09/13/2014  5:07 PM 09/22/2014  6:09 PM Full Code [id number]  Flonnie Overman Dhungel, MD Inpatient   03/20/2014 11:33 PM 03/21/2014  5:46 PM Full Code [id number]  Arne Cleveland, MD Inpatient   03/18/2014 11:46 PM 03/20/2014 11:33 PM Full Code [id number]  [name], MD Inpatient   03/05/2014  9:04 PM 03/06/2014  6:57 PM Full Code [id number]  [name], MD Inpatient      TOTAL TIME TAKING CARE OF THIS PATIENT: 35 minutes.    Loletha Grayer M.D on 03/23/2016 at 2:48 PM  Between 7am to  6pm - Pager - [phone]  After 6pm go to [email or web address] - password [email or web address]  [facility] Office  [phone]  CC: Primary care physician; [name], MD

## 2016-03-23 NOTE — Progress Notes (Signed)
Patient walked on RA at this time. Pulse ox = 99%. Patient tolerated well. [name] [facility]

## 2016-03-23 NOTE — Care Management (Addendum)
Patient assessed for home 02.  Does not qualify.  Ambulatory room air sats 99%. Discussed 11 ED visits within last 6 months and patient says 'well yeh- I was sick."  He acknowledges he uses the ED instead of seeing his PCP.  He acknowledges that he lives in his car but keeps his medications filled.  Receives Medical sales representative but says I am not real good with my money. He does not want to go to the shelter in [place] but okay with [employer] in [place].  Says he does not have any family- only stepchildren.   Says he needs help completing housing authority application.  Updated CSW.  Patient says "I just may stay in [place]- I am from here you know."

## 2016-03-26 LAB — CULTURE, BLOOD (ROUTINE X 2)
CULTURE: NO GROWTH
Culture: NO GROWTH

## 2016-03-27 ENCOUNTER — Emergency Department (HOSPITAL_COMMUNITY): Payer: Medicare Other

## 2016-03-27 ENCOUNTER — Emergency Department (HOSPITAL_COMMUNITY)
Admission: EM | Admit: 2016-03-27 | Discharge: 2016-03-27 | Disposition: A | Discharge status: Home / Self Care | Payer: Medicare Other | Attending: Emergency Medicine | Admitting: Emergency Medicine

## 2016-03-27 ENCOUNTER — Encounter (HOSPITAL_COMMUNITY): Payer: Self-pay | Admitting: Emergency Medicine

## 2016-03-27 DIAGNOSIS — Z87891 Personal history of nicotine dependence: Secondary | ICD-10-CM | POA: Insufficient documentation

## 2016-03-27 DIAGNOSIS — Z8673 Personal history of transient ischemic attack (TIA), and cerebral infarction without residual deficits: Secondary | ICD-10-CM | POA: Insufficient documentation

## 2016-03-27 DIAGNOSIS — Z79899 Other long term (current) drug therapy: Secondary | ICD-10-CM | POA: Insufficient documentation

## 2016-03-27 DIAGNOSIS — I509 Heart failure, unspecified: Secondary | ICD-10-CM

## 2016-03-27 DIAGNOSIS — I130 Hypertensive heart and chronic kidney disease with heart failure and stage 1 through stage 4 chronic kidney disease, or unspecified chronic kidney disease: Secondary | ICD-10-CM | POA: Insufficient documentation

## 2016-03-27 DIAGNOSIS — Z7982 Long term (current) use of aspirin: Secondary | ICD-10-CM | POA: Diagnosis not present

## 2016-03-27 DIAGNOSIS — N182 Chronic kidney disease, stage 2 (mild): Secondary | ICD-10-CM | POA: Insufficient documentation

## 2016-03-27 DIAGNOSIS — R4781 Slurred speech: Secondary | ICD-10-CM | POA: Diagnosis present

## 2016-03-27 DIAGNOSIS — I5043 Acute on chronic combined systolic (congestive) and diastolic (congestive) heart failure: Secondary | ICD-10-CM | POA: Insufficient documentation

## 2016-03-27 DIAGNOSIS — I2510 Atherosclerotic heart disease of native coronary artery without angina pectoris: Secondary | ICD-10-CM | POA: Insufficient documentation

## 2016-03-27 LAB — COMPREHENSIVE METABOLIC PANEL
ALBUMIN: 3.6 g/dL (ref 3.5–5.0)
ALT: 20 U/L (ref 17–63)
ANION GAP: 9 (ref 5–15)
AST: 34 U/L (ref 15–41)
Alkaline Phosphatase: 175 U/L — ABNORMAL HIGH (ref 38–126)
BUN: 16 mg/dL (ref 6–20)
CHLORIDE: 103 mmol/L (ref 101–111)
CO2: 27 mmol/L (ref 22–32)
Calcium: 9.2 mg/dL (ref 8.9–10.3)
Creatinine, Ser: 1.34 mg/dL — ABNORMAL HIGH (ref 0.61–1.24)
GFR calc Af Amer: >60 mL/min (ref >60)
GFR calc non Af Amer: 52 mL/min — ABNORMAL LOW (ref >60)
GLUCOSE: 142 mg/dL — AB (ref 65–99)
POTASSIUM: 4.1 mmol/L (ref 3.5–5.1)
SODIUM: 139 mmol/L (ref 135–145)
Total Bilirubin: 1.3 mg/dL — ABNORMAL HIGH (ref 0.3–1.2)
Total Protein: 7.4 g/dL (ref 6.5–8.1)

## 2016-03-27 LAB — CBC WITH DIFFERENTIAL/PLATELET
BASOS PCT: 4 %
Basophils Absolute: 1.6 10*3/uL — ABNORMAL HIGH (ref 0.0–0.1)
EOS PCT: 2 %
Eosinophils Absolute: 0.8 10*3/uL — ABNORMAL HIGH (ref 0.0–0.7)
HEMATOCRIT: 33.1 % — AB (ref 39.0–52.0)
Hemoglobin: 10.4 g/dL — ABNORMAL LOW (ref 13.0–17.0)
LYMPHS PCT: 7 %
Lymphs Abs: 2.8 10*3/uL (ref 0.7–4.0)
MCH: 31.3 pg (ref 26.0–34.0)
MCHC: 31.4 g/dL (ref 30.0–36.0)
MCV: 99.7 fL (ref 78.0–100.0)
MONOS PCT: 8 %
Monocytes Absolute: 3.2 10*3/uL — ABNORMAL HIGH (ref 0.1–1.0)
NEUTROS PCT: 79 %
Neutro Abs: 31.4 10*3/uL — ABNORMAL HIGH (ref 1.7–7.7)
Platelets: 957 10*3/uL (ref 150–400)
RBC: 3.32 MIL/uL — ABNORMAL LOW (ref 4.22–5.81)
RDW: 16.9 % — ABNORMAL HIGH (ref 11.5–15.5)
WBC: 39.8 10*3/uL — ABNORMAL HIGH (ref 4.0–10.5)

## 2016-03-27 LAB — PROTIME-INR
INR: 1.19
Prothrombin Time: 15.2 seconds (ref 11.4–15.2)

## 2016-03-27 LAB — URINALYSIS, ROUTINE W REFLEX MICROSCOPIC
BILIRUBIN URINE: NEGATIVE
Glucose, UA: NEGATIVE mg/dL
Hgb urine dipstick: NEGATIVE
Ketones, ur: NEGATIVE mg/dL
Leukocytes, UA: NEGATIVE
NITRITE: NEGATIVE
PROTEIN: NEGATIVE mg/dL
SPECIFIC GRAVITY, URINE: 1.004 — AB (ref 1.005–1.030)
pH: 7 (ref 5.0–8.0)

## 2016-03-27 LAB — I-STAT CG4 LACTIC ACID, ED
LACTIC ACID, VENOUS: 1.4 mmol/L (ref 0.5–1.9)
Lactic Acid, Venous: 1.53 mmol/L (ref 0.5–1.9)

## 2016-03-27 LAB — I-STAT TROPONIN, ED: TROPONIN I, POC: 0.03 ng/mL (ref 0.00–0.08)

## 2016-03-27 LAB — BRAIN NATRIURETIC PEPTIDE: B NATRIURETIC PEPTIDE 5: 2790.9 pg/mL — AB (ref 0.0–100.0)

## 2016-03-27 MED ORDER — FUROSEMIDE 10 MG/ML IJ SOLN
40.0000 mg | INTRAMUSCULAR | Status: AC
  Administered 2016-03-27: 40 mg via INTRAVENOUS
  Filled 2016-03-27: qty 4

## 2016-03-27 MED ORDER — LORAZEPAM 2 MG/ML IJ SOLN
1.0000 mg | Freq: Once | INTRAMUSCULAR | Status: AC
  Administered 2016-03-27: 1 mg via INTRAVENOUS
  Filled 2016-03-27: qty 1

## 2016-03-27 NOTE — Discharge Instructions (Signed)
Read the information below.  You may return to the Emergency Department at any time for worsening condition or any new symptoms that concern you.  If you develop worsening chest pain, shortness of breath, fever, you pass out, or become weak or dizzy, return to the ER for a recheck.   Take all of your medications as directed.

## 2016-03-27 NOTE — ED Provider Notes (Signed)
[facility] DEPT Provider Note   CSN: [id number] Arrival date & time: 03/27/16  1613     History   Chief Complaint Chief Complaint  Patient presents with  . Shortness of Breath  . Congestive Heart Failure  . Fever    HPI Bruce [name] is a 70 y.o. male.  HPI   Pt with hx CML, CHF, stroke, HTN, HLD, CKD, homelessness, medication noncompliance presents with multiple complaints.  Pt was admitted to [facility] 03/21/16-03/23/16 for CHF exacerbation.  At discharge tordesmide was increased to 80mg  - pt states he just filled this today, hasn't been taking his medication as prescribed.   While he was there he refused MRI to r/o stroke for new slurred speech.  Today pt presents requesting MRI to r/o stroke for slurred speech.  That has been ongoing x several weeks.  Denies any other focal neurologic deficits.  Pt also notes left sided chest pain with lightheadedness, diaphoresis that is described as aching, began at 4:30pm today.  The chest pain is worse with coughing.  Reports SOB is "rough," and is coughing up green and white sputum.  Continues to have diffuse bilateral leg swelling.    Past Medical History:  Diagnosis Date  . Bell's palsy   . CHF (congestive heart failure) ([place])   . CKD (chronic kidney disease), stage II   . CML (chronic myelocytic leukemia) ([place])   . Coronary artery disease   . Hypercholesterolemia   . Hypertension   . Leukemia ([place])   . Stroke ([place])    No residual limb weakness.  Walks with cane at baseline.   Marland Kitchen TIA (transient ischemic attack) 05/10/2014    Patient Active Problem List   Diagnosis Date Noted  . Acute respiratory distress 03/22/2016  . Homeless 03/22/2016  . Slurred speech 03/22/2016  . History of stroke 03/22/2016  . Elevated lactic acid level 10/11/2015  . CKD (chronic kidney disease), stage II   . Dizziness 01/25/2015  . Acute on chronic combined systolic and diastolic CHF (congestive heart failure) ([place])  01/25/2015  . Essential hypertension 01/25/2015  . Compliance poor 01/25/2015  . Coronary artery disease   . H/O: stroke with residual effects 09/13/2014  . Transaminitis 09/13/2014  . Elevated troponin 09/13/2014  . TIA (transient ischemic attack) 05/10/2014  . CML (chronic myelocytic leukemia) ([place]) 03/27/2014  . Leukocytosis 03/18/2014    Past Surgical History:  Procedure Laterality Date  . BACK SURGERY    . HIP ARTHROPLASTY Right    orif  . ORIF FOREARM FRACTURE Right        Home Medications    Prior to Admission medications   Medication Sig Start Date End Date Taking? Authorizing Provider  acetaminophen (TYLENOL) 325 MG tablet Take 2 tablets (650 mg total) by mouth every 4 (four) hours as needed for headache or mild pain. 06/06/15  Yes [name], MD  albuterol (PROVENTIL HFA;VENTOLIN HFA) 108 (90 BASE) MCG/ACT inhaler Inhale 2 puffs into the lungs every 4 (four) hours as needed for wheezing or shortness of breath. 08/08/14  Yes Noemi Chapel, MD  ALPRAZolam Duanne Moron) 0.25 MG tablet Take 0.25 mg by mouth 2 (two) times daily as needed for anxiety.   Yes Historical Provider, MD  amiodarone (PACERONE) 200 MG tablet Take 1 tablet (200 mg total) by mouth daily. 12/30/15  Yes Shirley Friar, PA-C  atorvastatin (LIPITOR) 40 MG tablet Take 1 tablet (40 mg total) by mouth daily. 12/25/15  Yes Amy Estrella Deeds, NP  carvedilol (  COREG) 3.125 MG tablet Take 1 tablet (3.125 mg total) by mouth 2 (two) times daily. 12/25/15  Yes Amy D Clegg, NP  clopidogrel (PLAVIX) 75 MG tablet Take 1 tablet (75 mg total) by mouth daily. 03/21/14  Yes [name], MD  digoxin (LANOXIN) 0.125 MG tablet Take 1 tablet (0.125 mg total) by mouth daily. 01/29/15  Yes Barton Dubois, MD  sacubitril-valsartan (ENTRESTO) 49-51 MG Take 1 tablet by mouth 2 (two) times daily. 12/30/15  Yes Shirley Friar, PA-C  spironolactone (ALDACTONE) 25 MG tablet Take 0.5 tablets (12.5 mg total) by mouth daily. Patient  taking differently: Take 25 mg by mouth daily.  03/23/16 06/21/16 Yes Richard Leslye Peer, MD  torsemide (DEMADEX) 20 MG tablet Take 4 tablets (80 mg total) by mouth daily. 03/23/16  Yes Loletha Grayer, MD  aspirin 81 MG chewable tablet Chew 1 tablet (81 mg total) by mouth daily. Patient not taking: Reported on 03/27/2016 03/23/16   Loletha Grayer, MD    Family History Family History  Problem Relation Age of Onset  . Diabetes Mother   . Hypertension Mother   . Diabetes Father   . Hypertension Father   . Diabetes Brother   . Hypertension Brother   . Diabetes Sister   . Hypertension Sister   . Diabetes Brother   . Hypertension Brother   . Diabetes Sister   . Hypertension Sister     Social History Social History  Substance Use Topics  . Smoking status: Former Smoker    Packs/day: 0.50    Years: 50.00    Types: Cigarettes  . Smokeless tobacco: Never Used  . Alcohol use 0.6 oz/week    1 Cans of beer per week     Comment: daily      Allergies   Patient has no known allergies.   Review of Systems Review of Systems  All other systems reviewed and are negative.    Physical Exam Updated Vital Signs BP 145/94   Pulse 89   Temp 98.8 F (37.1 C) (Oral)   Resp 20   Ht 5\' 8"  (1.727 m)   Wt 78.5 kg   SpO2 100%   BMI 26.30 kg/m   Physical Exam  Constitutional: He appears well-developed and well-nourished. No distress.  HENT:  Head: Normocephalic and atraumatic.  Neck: Neck supple.  Cardiovascular: Normal rate and regular rhythm.   Pulmonary/Chest: Effort normal. No respiratory distress. He has rales (mild).  coughing  Abdominal: Soft. He exhibits no distension and no mass. There is no tenderness. There is no rebound and no guarding.  Musculoskeletal: He exhibits edema (bilateral lower extremities).  Neurological: He is alert. He exhibits normal muscle tone.  Skin: He is not diaphoretic.  Nursing note and vitals reviewed.    ED Treatments / Results  Labs (all  labs ordered are listed, but only abnormal results are displayed) Labs Reviewed  COMPREHENSIVE METABOLIC PANEL - Abnormal; Notable for the following:       Result Value   Glucose, Bld 142 (*)    Creatinine, Ser 1.34 (*)    Alkaline Phosphatase 175 (*)    Total Bilirubin 1.3 (*)    GFR calc non Af Amer 52 (*)    All other components within normal limits  CBC WITH DIFFERENTIAL/PLATELET - Abnormal; Notable for the following:    WBC 39.8 (*)    RBC 3.32 (*)    Hemoglobin 10.4 (*)    HCT 33.1 (*)    RDW 16.9 (*)  Platelets 957 (*)    Neutro Abs 31.4 (*)    Monocytes Absolute 3.2 (*)    Eosinophils Absolute 0.8 (*)    Basophils Absolute 1.6 (*)    All other components within normal limits  URINALYSIS, ROUTINE W REFLEX MICROSCOPIC - Abnormal; Notable for the following:    Color, Urine STRAW (*)    Specific Gravity, Urine 1.004 (*)    All other components within normal limits  BRAIN NATRIURETIC PEPTIDE - Abnormal; Notable for the following:    B Natriuretic Peptide 2,790.9 (*)    All other components within normal limits  CULTURE, BLOOD (ROUTINE X 2)  CULTURE, BLOOD (ROUTINE X 2)  URINE CULTURE  PROTIME-INR  PATHOLOGIST SMEAR REVIEW  I-STAT CG4 LACTIC ACID, ED  I-STAT TROPOININ, ED  I-STAT CG4 LACTIC ACID, ED    EKG  EKG Interpretation  Date/Time:  Saturday March 27 2016 17:54:29 EST Ventricular Rate:  87 PR Interval:    QRS Duration: 98 QT Interval:  405 QTC Calculation: 488 R Axis:   -40 Text Interpretation:  Sinus rhythm Left axis deviation Nonspecific T abnormalities, lateral leads Borderline prolonged QT interval No significant change since last tracing Confirmed by [facility] MD, JULIE ([id number]) on 03/27/2016 6:10:42 PM       Radiology Dg Chest 2 View  Result Date: 03/27/2016 CLINICAL DATA:  Shortness of breath and pain. EXAM: CHEST  2 VIEW COMPARISON:  March 22, 2016 FINDINGS: Mild cardiomegaly is stable. The hila and mediastinum are unchanged  unremarkable. No pneumothorax. No pulmonary nodules, masses, or focal infiltrates. IMPRESSION: No active cardiopulmonary disease. Electronically Signed   By: Dorise Bullion III M.D   On: 03/27/2016 18:04    Procedures Procedures (including critical care time)  Medications Ordered in ED Medications  furosemide (LASIX) injection 40 mg (40 mg Intravenous Given 03/27/16 2025)  LORazepam (ATIVAN) injection 1 mg (1 mg Intravenous Given 03/27/16 1900)     Initial Impression / Assessment and Plan / ED Course  I have reviewed the triage vital signs and the nursing notes.  Pertinent labs & imaging results that were available during my care of the patient were reviewed by me and considered in my medical decision making (see chart for details).  Clinical Course as of Mar 27 2037  Sat Mar 27, 2016  [id number] I spoke with Dr Doylene Canard who agrees with 40 IV lasix and monitoring for 2-3 hours.  If patient is diuresing appropriately and feeling better, he may be discharged home and see them in the office on Monday.  If he is not, will call him back to admit for observation overnight and further diureses.    [EW]    Clinical Course User Index [EW] Clayton Bibles, PA-C    Afebrile nontoxic patient with hx noncompliance, CHF with EF 15-20%, nonobstructive CAD, stroke, CML, CKD, HTN, HLD with recent admission to [facility] for CHF exacerbation.  Did not fill his medications until today.  Reports he is now ready for MRI for dysarthria ongoing several weeks, previously refused MRI.  Also reports left sided chest pain with diaphoresis and lightheadedness that began at 4:30pm.  HEART score is 4.  I spoke with Dr Doylene Canard, on call for Dr Terrence Dupont, patient's cardiologist, who recommends keeping pt in ED for 2-3 following IV lasix and monitoring, does not recommend repeat troponin.  Anticipate urinary output of 400-500cc.   If improving pt may be d/c home with follow up in office on Monday, if not he will admit.  Patient signed out to [facility], PA-C, at change of shift pending reassessment.  Pt is actively diuresing and is feeling improvement.  Anticipate discharge.   Final Clinical Impressions(s) / ED Diagnoses   Final diagnoses:  Congestive heart failure, unspecified congestive heart failure chronicity, unspecified congestive heart failure type [facility])    New Prescriptions New Prescriptions   No medications on file     Clayton Bibles, PA-C 03/27/16 2038    Clayton Bibles, PA-C 03/27/16 2044    Isla Pence, MD 03/27/16 2112

## 2016-03-27 NOTE — ED Notes (Signed)
MRI tech called pt states he is unable to complete exam, ESP made aware and offered pt more Ativan. PT declined and will return to the department

## 2016-03-27 NOTE — ED Triage Notes (Signed)
Pt to ED with c/o heart fluttering and shortness of breath-- pt spent 2-3 days at [facility] with pneumonia recently.   Pt is sleeping in car, does not want to stay at shelter-- states has been eating ok.

## 2016-03-27 NOTE — ED Notes (Signed)
Pt is in radiology, called them and they will bring pt to room 40 after exam

## 2016-03-27 NOTE — ED Provider Notes (Signed)
9:53 PM Patient care assumed from [facility], PA-C at shift change. Plan to discharge if patient feeling improved after diuresis. Patient has diuresed 1400cc. He is resting comfortably. VSS. No hypoxia. Labs at baseline. When asked if he feels comfortable with discharge he states, "Yeah, I'll be alright". He has had a [place] sandwich. Plan for outpatient follow up with Dr. Terrence Dupont on Monday. Patient agreeable to plan with no unaddressed concerns.   Antonietta Breach, PA-C 03/27/16 2156    Isla Pence, MD 03/27/16 [phone]

## 2016-03-29 LAB — URINE CULTURE: Culture: <10000 — AB

## 2016-03-30 LAB — PATHOLOGIST SMEAR REVIEW: PATH REVIEW: INCREASED

## 2016-04-01 LAB — CULTURE, BLOOD (ROUTINE X 2)
Culture: NO GROWTH
Culture: NO GROWTH

## 2016-04-04 ENCOUNTER — Emergency Department (HOSPITAL_COMMUNITY): Payer: Medicare Other

## 2016-04-04 ENCOUNTER — Emergency Department (HOSPITAL_COMMUNITY)
Admission: EM | Admit: 2016-04-04 | Discharge: 2016-04-04 | Disposition: A | Discharge status: Home / Self Care | Payer: Medicare Other | Attending: Emergency Medicine | Admitting: Emergency Medicine

## 2016-04-04 ENCOUNTER — Encounter (HOSPITAL_COMMUNITY): Payer: Self-pay | Admitting: Emergency Medicine

## 2016-04-04 DIAGNOSIS — R05 Cough: Secondary | ICD-10-CM | POA: Insufficient documentation

## 2016-04-04 DIAGNOSIS — R600 Localized edema: Secondary | ICD-10-CM | POA: Diagnosis not present

## 2016-04-04 DIAGNOSIS — N183 Chronic kidney disease, stage 3 (moderate): Secondary | ICD-10-CM | POA: Diagnosis not present

## 2016-04-04 DIAGNOSIS — I509 Heart failure, unspecified: Secondary | ICD-10-CM | POA: Diagnosis not present

## 2016-04-04 DIAGNOSIS — I2510 Atherosclerotic heart disease of native coronary artery without angina pectoris: Secondary | ICD-10-CM | POA: Insufficient documentation

## 2016-04-04 DIAGNOSIS — I130 Hypertensive heart and chronic kidney disease with heart failure and stage 1 through stage 4 chronic kidney disease, or unspecified chronic kidney disease: Secondary | ICD-10-CM | POA: Insufficient documentation

## 2016-04-04 DIAGNOSIS — Z8673 Personal history of transient ischemic attack (TIA), and cerebral infarction without residual deficits: Secondary | ICD-10-CM | POA: Diagnosis not present

## 2016-04-04 DIAGNOSIS — Z7982 Long term (current) use of aspirin: Secondary | ICD-10-CM | POA: Insufficient documentation

## 2016-04-04 DIAGNOSIS — Z87891 Personal history of nicotine dependence: Secondary | ICD-10-CM | POA: Insufficient documentation

## 2016-04-04 DIAGNOSIS — M7989 Other specified soft tissue disorders: Secondary | ICD-10-CM | POA: Diagnosis present

## 2016-04-04 DIAGNOSIS — R609 Edema, unspecified: Secondary | ICD-10-CM

## 2016-04-04 DIAGNOSIS — R059 Cough, unspecified: Secondary | ICD-10-CM

## 2016-04-04 DIAGNOSIS — Z96641 Presence of right artificial hip joint: Secondary | ICD-10-CM | POA: Diagnosis not present

## 2016-04-04 DIAGNOSIS — Z79899 Other long term (current) drug therapy: Secondary | ICD-10-CM | POA: Diagnosis not present

## 2016-04-04 LAB — COMPREHENSIVE METABOLIC PANEL
ALBUMIN: 3.6 g/dL (ref 3.5–5.0)
ALT: 14 U/L — ABNORMAL LOW (ref 17–63)
ANION GAP: 10 (ref 5–15)
AST: 26 U/L (ref 15–41)
Alkaline Phosphatase: 197 U/L — ABNORMAL HIGH (ref 38–126)
BILIRUBIN TOTAL: 1.1 mg/dL (ref 0.3–1.2)
BUN: 13 mg/dL (ref 6–20)
CHLORIDE: 107 mmol/L (ref 101–111)
CO2: 21 mmol/L — AB (ref 22–32)
Calcium: 9.3 mg/dL (ref 8.9–10.3)
Creatinine, Ser: 1.04 mg/dL (ref 0.61–1.24)
GFR calc Af Amer: >60 mL/min (ref >60)
GFR calc non Af Amer: >60 mL/min (ref >60)
GLUCOSE: 104 mg/dL — AB (ref 65–99)
POTASSIUM: 3.9 mmol/L (ref 3.5–5.1)
SODIUM: 138 mmol/L (ref 135–145)
Total Protein: 7.2 g/dL (ref 6.5–8.1)

## 2016-04-04 LAB — CBC WITH DIFFERENTIAL/PLATELET
BASOS ABS: 0 10*3/uL (ref 0.0–0.1)
BASOS PCT: 0 %
Band Neutrophils: 15 %
Blasts: 0 %
Eosinophils Absolute: 1.8 10*3/uL — ABNORMAL HIGH (ref 0.0–0.7)
Eosinophils Relative: 4 %
HEMATOCRIT: 32.5 % — AB (ref 39.0–52.0)
Hemoglobin: 10.2 g/dL — ABNORMAL LOW (ref 13.0–17.0)
Lymphocytes Relative: 5 %
Lymphs Abs: 2.3 10*3/uL (ref 0.7–4.0)
MCH: 31.4 pg (ref 26.0–34.0)
MCHC: 31.4 g/dL (ref 30.0–36.0)
MCV: 100 fL (ref 78.0–100.0)
METAMYELOCYTES PCT: 4 %
MONO ABS: 0.9 10*3/uL (ref 0.1–1.0)
MYELOCYTES: 3 %
Monocytes Relative: 2 %
NEUTROS PCT: 67 %
NRBC: 0 /100{WBCs}
Neutro Abs: 40.7 10*3/uL — ABNORMAL HIGH (ref 1.7–7.7)
Other: 0 %
PLATELETS: 924 10*3/uL — AB (ref 150–400)
Promyelocytes Absolute: 0 %
RBC: 3.25 MIL/uL — ABNORMAL LOW (ref 4.22–5.81)
RDW: 17 % — AB (ref 11.5–15.5)
WBC: 45.7 10*3/uL — AB (ref 4.0–10.5)

## 2016-04-04 LAB — DIGOXIN LEVEL

## 2016-04-04 LAB — I-STAT TROPONIN, ED: Troponin i, poc: 0.04 ng/mL (ref 0.00–0.08)

## 2016-04-04 LAB — LIPASE, BLOOD: Lipase: 26 U/L (ref 11–51)

## 2016-04-04 LAB — ETHANOL: ALCOHOL ETHYL (B): 30 mg/dL — AB (ref <5)

## 2016-04-04 LAB — BRAIN NATRIURETIC PEPTIDE: B NATRIURETIC PEPTIDE 5: 1141.8 pg/mL — AB (ref 0.0–100.0)

## 2016-04-04 MED ORDER — FUROSEMIDE 10 MG/ML IJ SOLN
80.0000 mg | Freq: Once | INTRAMUSCULAR | Status: AC
  Administered 2016-04-04: 80 mg via INTRAVENOUS
  Filled 2016-04-04: qty 8

## 2016-04-04 NOTE — Discharge Instructions (Signed)
Please make sure to take all your medication as prescribed by your doctor including lasix. Follow up with Dr. Terrence Dupont. Return if worsening.

## 2016-04-04 NOTE — ED Triage Notes (Signed)
Pt sts leg swelling x several days; pt sts "I am here to try and get admitted so that I can stop coming here"

## 2016-04-04 NOTE — ED Provider Notes (Signed)
[facility] DEPT Provider Note   CSN: [id number] Arrival date & time: 04/04/16  [id number]     History   Chief Complaint Chief Complaint  Patient presents with  . Leg Swelling    HPI Bruce [name] is a 70 y.o. male.  HPI Bruce [name] is a 70 y.o. male with history of CHF, chronic kidney disease, CML, coronary disease, hypertension, stroke, presents to emergency department with multiple complaints. Patient being complaint is shortness of breath, cough, leg swelling. He states the symptoms worsened in the last few days. He has had multiple visits to the ED and was admitted just 2 weeks ago for the same. Since then he has gained 22 pounds. He admits not to take his Lasix as prescribed. He states he is compliant with all his other medications. He states he doesn't take Lasix because he doesn't like running to the bathroom all the time. He denies any known fever or chills. He states he has been self-medicating with shots of liquor. He is also complaining of chronic abdominal pain which she states is from "my hernia." He states he is still having normal bowel movements. No trouble urinating. Reports some rash to his chest. States that he has been going through a lot and currently living out of his car. He states he is looking for a place to live at this time.  Past Medical History:  Diagnosis Date  . Bell's palsy   . CHF (congestive heart failure) ([place])   . CKD (chronic kidney disease), stage II   . CML (chronic myelocytic leukemia) ([place])   . Coronary artery disease   . Hypercholesterolemia   . Hypertension   . Leukemia ([place])   . Stroke ([place])    No residual limb weakness.  Walks with cane at baseline.   Marland Kitchen TIA (transient ischemic attack) 05/10/2014    Patient Active Problem List   Diagnosis Date Noted  . Acute respiratory distress 03/22/2016  . Homeless 03/22/2016  . Slurred speech 03/22/2016  . History of stroke 03/22/2016  . Elevated lactic acid level 10/11/2015  . CKD  (chronic kidney disease), stage II   . Dizziness 01/25/2015  . Acute on chronic combined systolic and diastolic CHF (congestive heart failure) ([place]) 01/25/2015  . Essential hypertension 01/25/2015  . Compliance poor 01/25/2015  . Coronary artery disease   . H/O: stroke with residual effects 09/13/2014  . Transaminitis 09/13/2014  . Elevated troponin 09/13/2014  . TIA (transient ischemic attack) 05/10/2014  . CML (chronic myelocytic leukemia) ([place]) 03/27/2014  . Leukocytosis 03/18/2014    Past Surgical History:  Procedure Laterality Date  . BACK SURGERY    . HIP ARTHROPLASTY Right    orif  . ORIF FOREARM FRACTURE Right        Home Medications    Prior to Admission medications   Medication Sig Start Date End Date Taking? Authorizing Provider  acetaminophen (TYLENOL) 325 MG tablet Take 2 tablets (650 mg total) by mouth every 4 (four) hours as needed for headache or mild pain. 06/06/15   [name], MD  albuterol (PROVENTIL HFA;VENTOLIN HFA) 108 (90 BASE) MCG/ACT inhaler Inhale 2 puffs into the lungs every 4 (four) hours as needed for wheezing or shortness of breath. 08/08/14   Noemi Chapel, MD  ALPRAZolam Duanne Moron) 0.25 MG tablet Take 0.25 mg by mouth 2 (two) times daily as needed for anxiety.    Historical Provider, MD  amiodarone (PACERONE) 200 MG tablet Take 1 tablet (200 mg total) by mouth daily.  12/30/15   Shirley Friar, PA-C  aspirin 81 MG chewable tablet Chew 1 tablet (81 mg total) by mouth daily. Patient not taking: Reported on 03/27/2016 03/23/16   Loletha Grayer, MD  atorvastatin (LIPITOR) 40 MG tablet Take 1 tablet (40 mg total) by mouth daily. 12/25/15   Amy D Clegg, NP  carvedilol (COREG) 3.125 MG tablet Take 1 tablet (3.125 mg total) by mouth 2 (two) times daily. 12/25/15   Amy D Ninfa Meeker, NP  clopidogrel (PLAVIX) 75 MG tablet Take 1 tablet (75 mg total) by mouth daily. 03/21/14   [name], MD  digoxin (LANOXIN) 0.125 MG tablet Take 1 tablet (0.125 mg total)  by mouth daily. 01/29/15   Barton Dubois, MD  sacubitril-valsartan (ENTRESTO) 49-51 MG Take 1 tablet by mouth 2 (two) times daily. 12/30/15   Shirley Friar, PA-C  spironolactone (ALDACTONE) 25 MG tablet Take 0.5 tablets (12.5 mg total) by mouth daily. Patient taking differently: Take 25 mg by mouth daily.  03/23/16 06/21/16  Loletha Grayer, MD  torsemide (DEMADEX) 20 MG tablet Take 4 tablets (80 mg total) by mouth daily. 03/23/16   Loletha Grayer, MD    Family History Family History  Problem Relation Age of Onset  . Diabetes Mother   . Hypertension Mother   . Diabetes Father   . Hypertension Father   . Diabetes Brother   . Hypertension Brother   . Diabetes Sister   . Hypertension Sister   . Diabetes Brother   . Hypertension Brother   . Diabetes Sister   . Hypertension Sister     Social History Social History  Substance Use Topics  . Smoking status: Former Smoker    Packs/day: 0.50    Years: 50.00    Types: Cigarettes  . Smokeless tobacco: Never Used  . Alcohol use 0.6 oz/week    1 Cans of beer per week     Comment: daily      Allergies   Patient has no known allergies.   Review of Systems Review of Systems  Constitutional: Negative for chills and fever.  Respiratory: Positive for cough, chest tightness, shortness of breath and wheezing.   Cardiovascular: Positive for chest pain and leg swelling. Negative for palpitations.  Gastrointestinal: Negative for abdominal distention, abdominal pain, diarrhea, nausea and vomiting.  Genitourinary: Negative for dysuria, frequency, hematuria and urgency.  Musculoskeletal: Negative for arthralgias, myalgias, neck pain and neck stiffness.  Skin: Negative for rash.  Allergic/Immunologic: Negative for immunocompromised state.  Neurological: Negative for dizziness, weakness, light-headedness, numbness and headaches.  All other systems reviewed and are negative.    Physical Exam Updated Vital Signs BP 135/86   Pulse  95   Temp 97.7 F (36.5 C) (Oral)   Resp 18   Ht 5\' 8"  (1.727 m)   Wt 78.5 kg   SpO2 99%   BMI 26.30 kg/m   Physical Exam  Constitutional: He appears well-developed and well-nourished. No distress.  HENT:  Head: Normocephalic and atraumatic.  Eyes: Conjunctivae are normal. Scleral icterus is present.  Neck: Neck supple.  Cardiovascular: Normal rate, regular rhythm and normal heart sounds.   Pulmonary/Chest: Effort normal. No respiratory distress. He has no wheezes. He has rales.  Abdominal: Soft. Bowel sounds are normal. He exhibits no distension. There is no tenderness. There is no rebound.  Musculoskeletal: He exhibits edema.  1+ edema bilaterally  Neurological: He is alert.  Skin: Skin is warm and dry.  Nursing note and vitals reviewed.    ED Treatments /  Results  Labs (all labs ordered are listed, but only abnormal results are displayed) Labs Reviewed  CBC WITH DIFFERENTIAL/PLATELET - Abnormal; Notable for the following:       Result Value   WBC 45.7 (*)    RBC 3.25 (*)    Hemoglobin 10.2 (*)    HCT 32.5 (*)    RDW 17.0 (*)    Platelets 924 (*)    Neutro Abs 40.7 (*)    Eosinophils Absolute 1.8 (*)    All other components within normal limits  COMPREHENSIVE METABOLIC PANEL - Abnormal; Notable for the following:    CO2 21 (*)    Glucose, Bld 104 (*)    ALT 14 (*)    Alkaline Phosphatase 197 (*)    All other components within normal limits  ETHANOL - Abnormal; Notable for the following:    Alcohol, Ethyl (B) 30 (*)    All other components within normal limits  DIGOXIN LEVEL - Abnormal; Notable for the following:    Digoxin Level <0.2 (*)    All other components within normal limits  BRAIN NATRIURETIC PEPTIDE - Abnormal; Notable for the following:    B Natriuretic Peptide 1,141.8 (*)    All other components within normal limits  LIPASE, BLOOD  PATHOLOGIST SMEAR REVIEW  I-STAT TROPOININ, ED    EKG  EKG Interpretation  Date/Time:  Sunday April 04 2016 10:45:49 EST Ventricular Rate:  96 PR Interval:    QRS Duration: 99 QT Interval:  396 QTC Calculation: 501 R Axis:   -37 Text Interpretation:  Sinus tachycardia Multiform ventricular premature complexes Left axis deviation Nonspecific T abnormalities, lateral leads Prolonged QT interval No significant change since last tracing Confirmed by KNAPP  MD-J, JON [id number]) on 04/04/2016 11:12:57 AM       Radiology Dg Chest 2 View  Result Date: 04/04/2016 CLINICAL DATA:  Bilateral lower extremity swelling for 1 month. Cough and shortness of breath for 1 week. EXAM: CHEST  2 VIEW COMPARISON:  PA and lateral chest 03/27/2016.  CT chest 12/03/2015. FINDINGS: There is cardiomegaly without edema. Lungs are clear. No pneumothorax or pleural effusion. No focal bony abnormality. IMPRESSION: Cardiomegaly without acute disease. Electronically Signed   By: Inge Rise M.D.   On: 04/04/2016 11:52    Procedures Procedures (including critical care time)  Medications Ordered in ED Medications - No data to display   Initial Impression / Assessment and Plan / ED Course  I have reviewed the triage vital signs and the nursing notes.  Pertinent labs & imaging results that were available during my care of the patient were reviewed by me and considered in my medical decision making (see chart for details).  Clinical Course     Patient in emergency department with leg swelling, shortness of breath, cough, abdominal pain. He has been noncompliant with Lasix. He does have 1+ bilateral lower extremity edema and has gained 22 pounds in the last 2 weeks. He does have some scleral icterus. Will check labs including LFTs and lipase. Will check cardiac markers BNP, chest x-ray. Will monitor.  Labs at baseline. CBC and platelets high, hx of CML, close to baseline. Digoxin negative, pt admitted to not taking his medications. States he does have them, just does not feel like taking them. BNP 1,1141, close to  baseline. Pt given 80mg  of lasix IV, pt diuresed more than 3L of fluid. He feels much better. He is not hypoxic. He is in no distress. Stable for follow up outpatient.  Discussed compliance.  Follow up with Dr. Terrence Dupont.   Vitals:   04/04/16 0855 04/04/16 1045 04/04/16 1118 04/04/16 1130  BP: (!) 151/122 135/86  126/97  Pulse: 97 95  93  Resp: 18 18  16   Temp: 97.7 F (36.5 C)     TempSrc: Oral     SpO2: 100% 99%  100%  Weight: 78.5 kg  77.6 kg   Height: 5\' 8"  (1.727 m)        Final Clinical Impressions(s) / ED Diagnoses   Final diagnoses:  Peripheral edema  Congestive heart failure, unspecified congestive heart failure chronicity, unspecified congestive heart failure type (HCC)  Cough    New Prescriptions New Prescriptions   No medications on file     Jeannett Senior, PA-C 04/04/16 1504    Dorie Rank, MD 04/04/16 1546

## 2016-04-06 LAB — PATHOLOGIST SMEAR REVIEW

## 2016-04-13 ENCOUNTER — Encounter: Payer: Self-pay | Admitting: *Deleted

## 2016-04-13 ENCOUNTER — Encounter: Payer: Medicare Other | Admitting: Physician Assistant

## 2016-04-13 NOTE — Progress Notes (Deleted)
Cardiology Office Note Date:  04/13/2016  Patient ID:  Bruce [name], Bruce [name] Mar 17, 1946, MRN [id number] PCP:  [name], MD  Cardiologist:  Dr. Terrence Dupont, MD (consult by Dr. Fletcher Anon)   ***refresh   Chief Complaint: Hospital follow up  History of Present Illness: RUE ROUTON is a 71 y.o. male with history of h/o chronic combined CHF class III, NICM with cardiac cath in 2008 showing nonobstructive CAD, homelessness living in his car, CKD stage II-III, medication and follow up noncompliance, CVA x 2 without Afib work up, atrial tachycardia on amiodarone, HTN, ETOH abuse, and CML not currently under treatment who presents for hospital follow up from, recent admission to [facility] from 12/10-12/12 for acute respiratory distress with hypoxia felt to be 2/2 acute on chronic combined CHF in the setting of medication noncompliance.    Prior echo in 01/2015 showed an EF of 15% with diffuse hypokinesis, moderate TR, PASP 45 mmHg. Most recent echo from 10/2015 showed an EF of 20-25%, diffuse hypokinesis, mild MR, mildly dilated left atrium, severely reduced RV systolic function, moderate TR, PASP 58 mmHg. He has been managed on Coreg, Entresto, digoxin, spironolactone, and torsemide as an outpatient. He has not followed up with [facility] for his CML since 03/2015. Unfortunately, he has been living in his car for the past 3 weeks prior to admission and has been unable to take some of his medications. Prior BNP of 3489 in August 2017. He reports a dry weight of 165 pounds. Admission weight of 181 pounds. Admitted to [facility] 12/10 for SOB and increased LE swelling. BNP noted to be 2200, hgb 9.9, wbc 37.1, plt 991, SCr 1.45, troponin minimally elevated at 0.05. Echo showed an EF of 15-20%, diffuse HK, unable to assess diastolic function, mild MR, LA mildly to moderately dilated, EV dilated with moderately reduced systolic function, moderate to severe TR, PASP 50 mMHg. He noted slurred speech the week prior to  admission. Head CT non-acute. He was diuresed with good UOP and discharged with outpatient follow up. Case manager was consulted for assistance with housing and medications. Seen in the ED on 03/27/16 for SOB and chest pain in the setting of medication noncompliance as he did not fill any medications from his prior discharge until 12/16. He was diuresed in the ED after discussing his carre with his primary cardiologist's partner (Dr. Ophelia Charter), Dr. Doylene Canard. He was seen again in the ED on 12/24 for SOB and self reported weight gain of 22 pounds (documented weight of 173 pounds at hospital discharge, 12/16 and 12/24). He admitted to medication noncompliance. He was self medicating with liquor. BNP of 1411 at that time. He was given IV Lasix with UOP of > 3 L in the ED and discharged home with recommendations to follow up with Dr. Terrence Dupont.   ***   Past Medical History:  Diagnosis Date  . Bell's palsy   . CHF (congestive heart failure) ([place])   . CKD (chronic kidney disease), stage II   . CML (chronic myelocytic leukemia) ([place])   . Coronary artery disease   . Hypercholesterolemia   . Hypertension   . Leukemia ([place])   . Stroke ([place])    No residual limb weakness.  Walks with cane at baseline.   Marland Kitchen TIA (transient ischemic attack) 05/10/2014    Past Surgical History:  Procedure Laterality Date  . BACK SURGERY    . HIP ARTHROPLASTY Right    orif  . ORIF FOREARM FRACTURE Right  Current Outpatient Prescriptions  Medication Sig Dispense Refill  . acetaminophen (TYLENOL) 325 MG tablet Take 2 tablets (650 mg total) by mouth every 4 (four) hours as needed for headache or mild pain. 60 tablet 3  . albuterol (PROVENTIL HFA;VENTOLIN HFA) 108 (90 BASE) MCG/ACT inhaler Inhale 2 puffs into the lungs every 4 (four) hours as needed for wheezing or shortness of breath. 1 Inhaler 3  . ALPRAZolam (XANAX) 0.25 MG tablet Take 0.25 mg by mouth 2 (two) times daily as needed for anxiety.    Marland Kitchen amiodarone (PACERONE)  200 MG tablet Take 1 tablet (200 mg total) by mouth daily. 30 tablet 5  . aspirin 81 MG chewable tablet Chew 1 tablet (81 mg total) by mouth daily. 30 tablet 0  . atorvastatin (LIPITOR) 40 MG tablet Take 1 tablet (40 mg total) by mouth daily. 30 tablet 5  . carvedilol (COREG) 3.125 MG tablet Take 1 tablet (3.125 mg total) by mouth 2 (two) times daily. 60 tablet 5  . clopidogrel (PLAVIX) 75 MG tablet Take 1 tablet (75 mg total) by mouth daily. 30 tablet 3  . digoxin (LANOXIN) 0.125 MG tablet Take 1 tablet (0.125 mg total) by mouth daily. 30 tablet 1  . sacubitril-valsartan (ENTRESTO) 49-51 MG Take 1 tablet by mouth 2 (two) times daily. 60 tablet 6  . spironolactone (ALDACTONE) 25 MG tablet Take 0.5 tablets (12.5 mg total) by mouth daily. (Patient taking differently: Take 25 mg by mouth daily. ) 15 tablet 3  . torsemide (DEMADEX) 20 MG tablet Take 4 tablets (80 mg total) by mouth daily. 120 tablet 0   No current facility-administered medications for this visit.     Allergies:   Patient has no known allergies.   Social History:  The patient  reports that he has quit smoking. His smoking use included Cigarettes. He has a 25.00 pack-year smoking history. He has never used smokeless tobacco. He reports that he drinks about 0.6 oz of alcohol per week . He reports that he uses drugs, including Marijuana and Cocaine.   Family History:  The patient's family history includes Diabetes in his brother, brother, father, mother, sister, and sister; Hypertension in his brother, brother, father, mother, sister, and sister.  ROS:   ROS   PHYSICAL EXAM: *** VS:  There were no vitals taken for this visit. BMI: There is no height or weight on file to calculate BMI.  Physical Exam   EKG:  Was ordered and interpreted by me today. Shows ***  Recent Labs: 10/10/2015: Magnesium 2.2 12/17/2015: TSH 1.704 04/04/2016: ALT 14; B Natriuretic Peptide 1,141.8; BUN 13; Creatinine, Ser 1.04; Hemoglobin 10.2; Platelets  924; Potassium 3.9; Sodium 138  No results found for requested labs within last 8760 hours.   Estimated Creatinine Clearance: 63.9 mL/min (by C-G formula based on SCr of 1.04 mg/dL).   Wt Readings from Last 3 Encounters:  04/04/16 171 lb 1 oz (77.6 kg)  03/27/16 173 lb (78.5 kg)  03/23/16 173 lb 3.2 oz (78.6 kg)     Other studies reviewed: Additional studies/records reviewed today include: summarized above  ASSESSMENT AND PLAN:  1. ***  Disposition: F/u with *** in   Current medicines are reviewed at length with the patient today.  The patient did not have any concerns regarding medicines.  Melvern Banker PA-C 04/13/2016 11:48 AM     [place] [address] [place], [place] [address] [phone]

## 2016-04-29 ENCOUNTER — Encounter (HOSPITAL_COMMUNITY): Payer: Self-pay | Admitting: Emergency Medicine

## 2016-04-29 ENCOUNTER — Emergency Department (HOSPITAL_COMMUNITY)
Admission: EM | Admit: 2016-04-29 | Discharge: 2016-04-29 | Disposition: A | Discharge status: Home / Self Care | Payer: Medicare Other | Attending: Emergency Medicine | Admitting: Emergency Medicine

## 2016-04-29 ENCOUNTER — Emergency Department (HOSPITAL_COMMUNITY): Payer: Medicare Other

## 2016-04-29 DIAGNOSIS — Z87891 Personal history of nicotine dependence: Secondary | ICD-10-CM | POA: Diagnosis not present

## 2016-04-29 DIAGNOSIS — I130 Hypertensive heart and chronic kidney disease with heart failure and stage 1 through stage 4 chronic kidney disease, or unspecified chronic kidney disease: Secondary | ICD-10-CM | POA: Insufficient documentation

## 2016-04-29 DIAGNOSIS — Z8673 Personal history of transient ischemic attack (TIA), and cerebral infarction without residual deficits: Secondary | ICD-10-CM | POA: Diagnosis not present

## 2016-04-29 DIAGNOSIS — I2510 Atherosclerotic heart disease of native coronary artery without angina pectoris: Secondary | ICD-10-CM | POA: Diagnosis not present

## 2016-04-29 DIAGNOSIS — R05 Cough: Secondary | ICD-10-CM | POA: Insufficient documentation

## 2016-04-29 DIAGNOSIS — Z856 Personal history of leukemia: Secondary | ICD-10-CM | POA: Diagnosis not present

## 2016-04-29 DIAGNOSIS — R059 Cough, unspecified: Secondary | ICD-10-CM

## 2016-04-29 DIAGNOSIS — I5043 Acute on chronic combined systolic (congestive) and diastolic (congestive) heart failure: Secondary | ICD-10-CM | POA: Insufficient documentation

## 2016-04-29 DIAGNOSIS — N182 Chronic kidney disease, stage 2 (mild): Secondary | ICD-10-CM | POA: Insufficient documentation

## 2016-04-29 DIAGNOSIS — Z7982 Long term (current) use of aspirin: Secondary | ICD-10-CM | POA: Insufficient documentation

## 2016-04-29 NOTE — ED Provider Notes (Signed)
[facility] DEPT Provider Note   CSN: [id number] Arrival date & time: 04/29/16  1608     History   Chief Complaint Chief Complaint  Patient presents with  . Cough   Level 5 caveat due to uncooperativeness. HPI Bruce [name] is a 71 y.o. male.  HPI Patient resents to the ER for reported cough. States he's been coughing for 6 months. Has some sputum with it. I also has pain in his hernia. States he's had a hernia for 6 months also. No fevers. Some mild production with the cough. Patient becomes somewhat agitated. States he thinks that someone has it out for him in the hospital because he comes in sick and he leaves sick. His been to the ER 11 times in the last 6 months. States he just wants to take the stuff off and go home. States does get his records and get a second opinion. Past Medical History:  Diagnosis Date  . Bell's palsy   . CHF (congestive heart failure) ([place])   . CKD (chronic kidney disease), stage II   . CML (chronic myelocytic leukemia) ([place])   . Coronary artery disease   . Hypercholesterolemia   . Hypertension   . Leukemia ([place])   . Stroke ([place])    No residual limb weakness.  Walks with cane at baseline.   Marland Kitchen TIA (transient ischemic attack) 05/10/2014    Patient Active Problem List   Diagnosis Date Noted  . Acute respiratory distress 03/22/2016  . Homeless 03/22/2016  . Slurred speech 03/22/2016  . History of stroke 03/22/2016  . Elevated lactic acid level 10/11/2015  . CKD (chronic kidney disease), stage II   . Dizziness 01/25/2015  . Acute on chronic combined systolic and diastolic CHF (congestive heart failure) ([place]) 01/25/2015  . Essential hypertension 01/25/2015  . Compliance poor 01/25/2015  . Coronary artery disease   . H/O: stroke with residual effects 09/13/2014  . Transaminitis 09/13/2014  . Elevated troponin 09/13/2014  . TIA (transient ischemic attack) 05/10/2014  . CML (chronic myelocytic leukemia) ([place]) 03/27/2014  . Leukocytosis  03/18/2014    Past Surgical History:  Procedure Laterality Date  . BACK SURGERY    . HIP ARTHROPLASTY Right    orif  . ORIF FOREARM FRACTURE Right        Home Medications    Prior to Admission medications   Medication Sig Start Date End Date Taking? Authorizing Provider  acetaminophen (TYLENOL) 325 MG tablet Take 2 tablets (650 mg total) by mouth every 4 (four) hours as needed for headache or mild pain. 06/06/15   [name], MD  albuterol (PROVENTIL HFA;VENTOLIN HFA) 108 (90 BASE) MCG/ACT inhaler Inhale 2 puffs into the lungs every 4 (four) hours as needed for wheezing or shortness of breath. 08/08/14   Noemi Chapel, MD  ALPRAZolam Duanne Moron) 0.25 MG tablet Take 0.25 mg by mouth 2 (two) times daily as needed for anxiety.    Historical Provider, MD  amiodarone (PACERONE) 200 MG tablet Take 1 tablet (200 mg total) by mouth daily. 12/30/15   Shirley Friar, PA-C  aspirin 81 MG chewable tablet Chew 1 tablet (81 mg total) by mouth daily. 03/23/16   Loletha Grayer, MD  atorvastatin (LIPITOR) 40 MG tablet Take 1 tablet (40 mg total) by mouth daily. 12/25/15   Amy D Clegg, NP  carvedilol (COREG) 3.125 MG tablet Take 1 tablet (3.125 mg total) by mouth 2 (two) times daily. 12/25/15   Amy D Ninfa Meeker, NP  clopidogrel (PLAVIX) 75 MG  tablet Take 1 tablet (75 mg total) by mouth daily. 03/21/14   [name], MD  digoxin (LANOXIN) 0.125 MG tablet Take 1 tablet (0.125 mg total) by mouth daily. 01/29/15   Barton Dubois, MD  sacubitril-valsartan (ENTRESTO) 49-51 MG Take 1 tablet by mouth 2 (two) times daily. 12/30/15   Shirley Friar, PA-C  spironolactone (ALDACTONE) 25 MG tablet Take 0.5 tablets (12.5 mg total) by mouth daily. Patient taking differently: Take 25 mg by mouth daily.  03/23/16 06/21/16  Loletha Grayer, MD  torsemide (DEMADEX) 20 MG tablet Take 4 tablets (80 mg total) by mouth daily. 03/23/16   Loletha Grayer, MD    Family History Family History  Problem Relation Age of  Onset  . Diabetes Mother   . Hypertension Mother   . Diabetes Father   . Hypertension Father   . Diabetes Brother   . Hypertension Brother   . Diabetes Sister   . Hypertension Sister   . Diabetes Brother   . Hypertension Brother   . Diabetes Sister   . Hypertension Sister     Social History Social History  Substance Use Topics  . Smoking status: Former Smoker    Packs/day: 0.50    Years: 50.00    Types: Cigarettes  . Smokeless tobacco: Never Used  . Alcohol use 0.6 oz/week    1 Cans of beer per week     Comment: daily      Allergies   Patient has no known allergies.   Review of Systems Review of Systems  Reason unable to perform ROS: uncooperative.  Respiratory: Positive for cough.   Gastrointestinal: Positive for abdominal pain.     Physical Exam Updated Vital Signs BP 124/80 (BP Location: Right Arm)   Pulse 93   Temp 97.8 F (36.6 C) (Oral)   Resp 19   Ht _0  (1.727 m)   Wt 178 lb 9.6 oz (81 kg)   SpO2 97%   BMI 27.16 kg/m   Physical Exam  Constitutional: He appears well-developed.  HENT:  Head: Normocephalic.  Cardiovascular: Normal rate.   Pulmonary/Chest: He has wheezes.  Abdominal:  Mild supraumbilical tenderness. Small bulge but no frank herniation.  Musculoskeletal: He exhibits no edema.  Neurological: He is alert.  Skin: Skin is warm.  Psychiatric:  Patient is mildly agitated.     ED Treatments / Results  Labs (all labs ordered are listed, but only abnormal results are displayed) Labs Reviewed - No data to display  EKG  EKG Interpretation  Date/Time:  Thursday April 29 2016 12:05:22 EST Ventricular Rate:  102 PR Interval:  170 QRS Duration: 94 QT Interval:  390 QTC Calculation: 508 R Axis:   -6 Text Interpretation:  Sinus tachycardia Possible Anterior infarct , age undetermined Abnormal ECG Confirmed by Alvino Chapel  MD, Gracen Ringwald [phone]) on 04/29/2016 4:16:50 PM       Radiology Dg Chest 2 View  Result Date:  04/29/2016 CLINICAL DATA:  Cough. EXAM: CHEST  2 VIEW COMPARISON:  04/04/2016 . FINDINGS: Mediastinum hilar structures normal. Cardiomegaly with mild pulmonary vascular prominence. No focal infiltrate. No pleural effusion or pneumothorax. IMPRESSION: Cardiomegaly with mild pulmonary vascular prominence. No focal infiltrate. Electronically Signed   By: Marcello Moores  Register   On: 04/29/2016 12:32    Procedures Procedures (including critical care time)  Medications Ordered in ED Medications - No data to display   Initial Impression / Assessment and Plan / ED Course  I have reviewed the triage vital signs and the nursing  notes.  Pertinent labs & imaging results that were available during my care of the patient were reviewed by me and considered in my medical decision making (see chart for details).     Patient with multiple complaints. Abdominal hernia cough pain in his left jaw. States that a week ago he had pain in his chest. Patient states that he thinks there is somewhat in hospital that has something against him. I told him I just met him and I am happy to see him and review everything. Patient states he just wants to take everything off and leave. States he will get his records and get a second opinion somewhere else.  Final Clinical Impressions(s) / ED Diagnoses   Final diagnoses:  Cough    New Prescriptions New Prescriptions   No medications on file     Davonna Belling, MD 04/29/16 1641

## 2016-04-29 NOTE — ED Notes (Signed)
Pt refused to have last set of vital signs and to sign. Pt stable and ambulatory upon discharge

## 2016-04-29 NOTE — Care Management CC44 (Signed)
ED CM noted patient to have had 10 ED visits in the past 6 months. Patient does not have a PCP listed and has [place],. CM awaiting patient to be brought back to a room .

## 2016-04-29 NOTE — Discharge Instructions (Signed)
Follow-up with Dr. Terrence Dupont for further management of your symptoms.

## 2016-04-29 NOTE — ED Triage Notes (Signed)
Pt sts cough and congestion with yellow sputum; pt sts taking lasix and urinating still with some swelling in his legs; pt sts pain with hernia also

## 2016-05-02 ENCOUNTER — Emergency Department: Payer: Medicare Other

## 2016-05-02 ENCOUNTER — Observation Stay
Admission: EM | Admit: 2016-05-02 | Discharge: 2016-05-07 | Disposition: A | Discharge status: Home / Self Care | Payer: Medicare Other | Attending: Internal Medicine | Admitting: Internal Medicine

## 2016-05-02 ENCOUNTER — Encounter: Payer: Self-pay | Admitting: Emergency Medicine

## 2016-05-02 DIAGNOSIS — R079 Chest pain, unspecified: Secondary | ICD-10-CM

## 2016-05-02 DIAGNOSIS — I2510 Atherosclerotic heart disease of native coronary artery without angina pectoris: Secondary | ICD-10-CM | POA: Diagnosis present

## 2016-05-02 DIAGNOSIS — R2681 Unsteadiness on feet: Secondary | ICD-10-CM

## 2016-05-02 DIAGNOSIS — E785 Hyperlipidemia, unspecified: Secondary | ICD-10-CM | POA: Insufficient documentation

## 2016-05-02 DIAGNOSIS — R778 Other specified abnormalities of plasma proteins: Secondary | ICD-10-CM | POA: Insufficient documentation

## 2016-05-02 DIAGNOSIS — C9210 Chronic myeloid leukemia, BCR/ABL-positive, not having achieved remission: Secondary | ICD-10-CM | POA: Diagnosis present

## 2016-05-02 DIAGNOSIS — R55 Syncope and collapse: Secondary | ICD-10-CM | POA: Diagnosis not present

## 2016-05-02 DIAGNOSIS — E7800 Pure hypercholesterolemia, unspecified: Secondary | ICD-10-CM | POA: Insufficient documentation

## 2016-05-02 DIAGNOSIS — I429 Cardiomyopathy, unspecified: Secondary | ICD-10-CM | POA: Diagnosis not present

## 2016-05-02 DIAGNOSIS — D72829 Elevated white blood cell count, unspecified: Secondary | ICD-10-CM

## 2016-05-02 DIAGNOSIS — M6281 Muscle weakness (generalized): Secondary | ICD-10-CM

## 2016-05-02 DIAGNOSIS — Z8673 Personal history of transient ischemic attack (TIA), and cerebral infarction without residual deficits: Secondary | ICD-10-CM | POA: Diagnosis not present

## 2016-05-02 DIAGNOSIS — I130 Hypertensive heart and chronic kidney disease with heart failure and stage 1 through stage 4 chronic kidney disease, or unspecified chronic kidney disease: Secondary | ICD-10-CM | POA: Diagnosis not present

## 2016-05-02 DIAGNOSIS — Z7902 Long term (current) use of antithrombotics/antiplatelets: Secondary | ICD-10-CM | POA: Diagnosis not present

## 2016-05-02 DIAGNOSIS — R7989 Other specified abnormal findings of blood chemistry: Secondary | ICD-10-CM | POA: Diagnosis present

## 2016-05-02 DIAGNOSIS — Z7982 Long term (current) use of aspirin: Secondary | ICD-10-CM | POA: Diagnosis not present

## 2016-05-02 DIAGNOSIS — Z96641 Presence of right artificial hip joint: Secondary | ICD-10-CM | POA: Diagnosis not present

## 2016-05-02 DIAGNOSIS — Z5900 Homelessness unspecified: Secondary | ICD-10-CM

## 2016-05-02 DIAGNOSIS — Z79899 Other long term (current) drug therapy: Secondary | ICD-10-CM | POA: Diagnosis not present

## 2016-05-02 DIAGNOSIS — Z9119 Patient's noncompliance with other medical treatment and regimen: Secondary | ICD-10-CM | POA: Insufficient documentation

## 2016-05-02 DIAGNOSIS — D473 Essential (hemorrhagic) thrombocythemia: Secondary | ICD-10-CM | POA: Diagnosis not present

## 2016-05-02 DIAGNOSIS — R4781 Slurred speech: Secondary | ICD-10-CM | POA: Diagnosis not present

## 2016-05-02 DIAGNOSIS — R0789 Other chest pain: Secondary | ICD-10-CM

## 2016-05-02 DIAGNOSIS — I420 Dilated cardiomyopathy: Secondary | ICD-10-CM

## 2016-05-02 DIAGNOSIS — D75839 Thrombocytosis, unspecified: Secondary | ICD-10-CM

## 2016-05-02 DIAGNOSIS — Z91199 Patient's noncompliance with other medical treatment and regimen due to unspecified reason: Secondary | ICD-10-CM

## 2016-05-02 DIAGNOSIS — I498 Other specified cardiac arrhythmias: Secondary | ICD-10-CM

## 2016-05-02 DIAGNOSIS — N182 Chronic kidney disease, stage 2 (mild): Secondary | ICD-10-CM | POA: Diagnosis not present

## 2016-05-02 DIAGNOSIS — I5043 Acute on chronic combined systolic (congestive) and diastolic (congestive) heart failure: Secondary | ICD-10-CM | POA: Diagnosis not present

## 2016-05-02 DIAGNOSIS — R42 Dizziness and giddiness: Secondary | ICD-10-CM

## 2016-05-02 DIAGNOSIS — R479 Unspecified speech disturbances: Secondary | ICD-10-CM

## 2016-05-02 DIAGNOSIS — I5023 Acute on chronic systolic (congestive) heart failure: Secondary | ICD-10-CM

## 2016-05-02 HISTORY — DX: Atherosclerotic heart disease of native coronary artery without angina pectoris: I25.10

## 2016-05-02 HISTORY — DX: Chronic combined systolic (congestive) and diastolic (congestive) heart failure: I50.42

## 2016-05-02 LAB — CBC WITH DIFFERENTIAL/PLATELET
BAND NEUTROPHILS: 4 %
BASOS ABS: 0 10*3/uL (ref 0–0.1)
BASOS PCT: 0 %
EOS ABS: 0 10*3/uL (ref 0–0.7)
Eosinophils Relative: 0 %
HEMATOCRIT: 32 % — AB (ref 40.0–52.0)
HEMOGLOBIN: 10.2 g/dL — AB (ref 13.0–18.0)
LYMPHS PCT: 3 %
Lymphs Abs: 2.1 10*3/uL (ref 1.0–3.6)
MCH: 31.3 pg (ref 26.0–34.0)
MCHC: 32 g/dL (ref 32.0–36.0)
MCV: 97.9 fL (ref 80.0–100.0)
METAMYELOCYTES PCT: 7 %
MONO ABS: 6.2 10*3/uL — AB (ref 0.2–1.0)
MONOS PCT: 9 %
Myelocytes: 5 %
NEUTROS ABS: 59.7 10*3/uL — AB (ref 1.4–6.5)
Neutrophils Relative %: 70 %
Other: 2 %
Platelets: 1066 10*3/uL (ref 150–440)
RBC: 3.27 MIL/uL — ABNORMAL LOW (ref 4.40–5.90)
RDW: 19 % — AB (ref 11.5–14.5)
WBC: 69.4 10*3/uL (ref 3.8–10.6)

## 2016-05-02 LAB — BASIC METABOLIC PANEL
Anion gap: 9 (ref 5–15)
BUN: 18 mg/dL (ref 6–20)
CHLORIDE: 108 mmol/L (ref 101–111)
CO2: 22 mmol/L (ref 22–32)
CREATININE: 1.04 mg/dL (ref 0.61–1.24)
Calcium: 8.9 mg/dL (ref 8.9–10.3)
GFR calc Af Amer: >60 mL/min (ref >60)
GFR calc non Af Amer: >60 mL/min (ref >60)
GLUCOSE: 152 mg/dL — AB (ref 65–99)
POTASSIUM: 3.8 mmol/L (ref 3.5–5.1)
SODIUM: 139 mmol/L (ref 135–145)

## 2016-05-02 LAB — CBC
HCT: 29.3 % — ABNORMAL LOW (ref 40.0–52.0)
Hemoglobin: 9.4 g/dL — ABNORMAL LOW (ref 13.0–18.0)
MCH: 31.2 pg (ref 26.0–34.0)
MCHC: 32.1 g/dL (ref 32.0–36.0)
MCV: 97.4 fL (ref 80.0–100.0)
PLATELETS: 977 10*3/uL — AB (ref 150–440)
RBC: 3.01 MIL/uL — ABNORMAL LOW (ref 4.40–5.90)
RDW: 18.5 % — AB (ref 11.5–14.5)
WBC: 67.3 10*3/uL — AB (ref 3.8–10.6)

## 2016-05-02 LAB — TROPONIN I
TROPONIN I: 0.16 ng/mL — AB (ref <0.03)
Troponin I: 0.15 ng/mL (ref <0.03)

## 2016-05-02 LAB — BRAIN NATRIURETIC PEPTIDE: B NATRIURETIC PEPTIDE 5: 2219 pg/mL — AB (ref 0.0–100.0)

## 2016-05-02 MED ORDER — CLOPIDOGREL BISULFATE 75 MG PO TABS
75.0000 mg | ORAL_TABLET | Freq: Every day | ORAL | Status: DC
  Administered 2016-05-02 – 2016-05-07 (×6): 75 mg via ORAL
  Filled 2016-05-02 (×6): qty 1

## 2016-05-02 MED ORDER — ONDANSETRON HCL 4 MG PO TABS: 4.0000 mg | ORAL_TABLET | Freq: Four times a day (QID) | ORAL | Status: DC | PRN

## 2016-05-02 MED ORDER — ALUM & MAG HYDROXIDE-SIMETH 200-200-20 MG/5ML PO SUSP: 30.0000 mL | ORAL | Status: DC | PRN

## 2016-05-02 MED ORDER — DOCUSATE SODIUM 100 MG PO CAPS
100.0000 mg | ORAL_CAPSULE | Freq: Two times a day (BID) | ORAL | Status: DC
  Administered 2016-05-02 – 2016-05-07 (×9): 100 mg via ORAL
  Filled 2016-05-02 (×10): qty 1

## 2016-05-02 MED ORDER — ATORVASTATIN CALCIUM 20 MG PO TABS
40.0000 mg | ORAL_TABLET | Freq: Every day | ORAL | Status: DC
  Administered 2016-05-02 – 2016-05-07 (×6): 40 mg via ORAL
  Filled 2016-05-02 (×6): qty 2

## 2016-05-02 MED ORDER — METOPROLOL TARTRATE 25 MG PO TABS
25.0000 mg | ORAL_TABLET | Freq: Two times a day (BID) | ORAL | Status: DC
Start: 2016-05-02 — End: 2016-05-02

## 2016-05-02 MED ORDER — ASPIRIN 81 MG PO CHEW
81.0000 mg | CHEWABLE_TABLET | Freq: Every day | ORAL | Status: DC
  Administered 2016-05-03 – 2016-05-07 (×5): 81 mg via ORAL
  Filled 2016-05-02 (×5): qty 1

## 2016-05-02 MED ORDER — SACUBITRIL-VALSARTAN 24-26 MG PO TABS
1.0000 | ORAL_TABLET | Freq: Two times a day (BID) | ORAL | Status: DC
  Administered 2016-05-02 – 2016-05-07 (×10): 1 via ORAL
  Filled 2016-05-02 (×10): qty 1

## 2016-05-02 MED ORDER — SODIUM CHLORIDE 0.9 % IV BOLUS (SEPSIS)
500.0000 mL | Freq: Once | INTRAVENOUS | Status: AC
Start: 2016-05-02 — End: 2016-05-02
  Administered 2016-05-02: 500 mL via INTRAVENOUS

## 2016-05-02 MED ORDER — BISACODYL 10 MG RE SUPP: 10.0000 mg | Freq: Every day | RECTAL | Status: DC | PRN

## 2016-05-02 MED ORDER — NITROGLYCERIN 0.4 MG SL SUBL: 0.4000 mg | SUBLINGUAL_TABLET | SUBLINGUAL | Status: DC | PRN

## 2016-05-02 MED ORDER — ENOXAPARIN SODIUM 40 MG/0.4ML ~~LOC~~ SOLN
40.0000 mg | SUBCUTANEOUS | Status: DC
  Administered 2016-05-02 – 2016-05-06 (×5): 40 mg via SUBCUTANEOUS
  Filled 2016-05-02 (×5): qty 0.4

## 2016-05-02 MED ORDER — PANTOPRAZOLE SODIUM 40 MG PO TBEC
40.0000 mg | DELAYED_RELEASE_TABLET | Freq: Every day | ORAL | Status: DC
  Administered 2016-05-02 – 2016-05-07 (×6): 40 mg via ORAL
  Filled 2016-05-02 (×6): qty 1

## 2016-05-02 MED ORDER — DIGOXIN 125 MCG PO TABS
0.1250 mg | ORAL_TABLET | Freq: Every day | ORAL | Status: DC
  Administered 2016-05-02 – 2016-05-03 (×2): 0.125 mg via ORAL
  Filled 2016-05-02 (×2): qty 1

## 2016-05-02 MED ORDER — FUROSEMIDE 40 MG PO TABS
80.0000 mg | ORAL_TABLET | Freq: Two times a day (BID) | ORAL | Status: DC
  Administered 2016-05-02 – 2016-05-03 (×2): 80 mg via ORAL
  Filled 2016-05-02 (×2): qty 2

## 2016-05-02 MED ORDER — ACETAMINOPHEN 650 MG RE SUPP: 650.0000 mg | Freq: Four times a day (QID) | RECTAL | Status: DC | PRN

## 2016-05-02 MED ORDER — CARVEDILOL 3.125 MG PO TABS
3.1250 mg | ORAL_TABLET | Freq: Two times a day (BID) | ORAL | Status: DC
  Administered 2016-05-02 – 2016-05-03 (×3): 3.125 mg via ORAL
  Filled 2016-05-02 (×4): qty 1

## 2016-05-02 MED ORDER — ONDANSETRON HCL 4 MG/2ML IJ SOLN: 4.0000 mg | Freq: Four times a day (QID) | INTRAMUSCULAR | Status: DC | PRN

## 2016-05-02 MED ORDER — ALPRAZOLAM 0.25 MG PO TABS
0.2500 mg | ORAL_TABLET | Freq: Two times a day (BID) | ORAL | Status: DC | PRN
  Administered 2016-05-03 – 2016-05-06 (×5): 0.25 mg via ORAL
  Filled 2016-05-02 (×6): qty 1

## 2016-05-02 MED ORDER — ACETAMINOPHEN 325 MG PO TABS: 650.0000 mg | ORAL_TABLET | Freq: Four times a day (QID) | ORAL | Status: DC | PRN

## 2016-05-02 MED ORDER — OXYCODONE-ACETAMINOPHEN 5-325 MG PO TABS
1.0000 | ORAL_TABLET | Freq: Four times a day (QID) | ORAL | Status: DC | PRN
Start: 2016-05-02 — End: 2016-05-07
  Administered 2016-05-02 – 2016-05-06 (×7): 1 via ORAL
  Filled 2016-05-02 (×7): qty 1

## 2016-05-02 MED ORDER — MORPHINE SULFATE (PF) 2 MG/ML IV SOLN: 2.0000 mg | INTRAVENOUS | Status: DC | PRN

## 2016-05-02 MED ORDER — ASPIRIN 81 MG PO CHEW
324.0000 mg | CHEWABLE_TABLET | Freq: Once | ORAL | Status: AC
  Administered 2016-05-02: 324 mg via ORAL
  Filled 2016-05-02: qty 4

## 2016-05-02 MED ORDER — SODIUM CHLORIDE 0.9 % IV SOLN: INTRAVENOUS | Status: DC

## 2016-05-02 MED ORDER — SODIUM CHLORIDE 0.9% FLUSH
3.0000 mL | Freq: Two times a day (BID) | INTRAVENOUS | Status: DC
  Administered 2016-05-02 – 2016-05-07 (×10): 3 mL via INTRAVENOUS

## 2016-05-02 MED ORDER — ALBUTEROL SULFATE (2.5 MG/3ML) 0.083% IN NEBU: 3.0000 mL | INHALATION_SOLUTION | RESPIRATORY_TRACT | Status: DC | PRN

## 2016-05-02 MED ORDER — SPIRONOLACTONE 25 MG PO TABS
25.0000 mg | ORAL_TABLET | Freq: Every day | ORAL | Status: DC
  Administered 2016-05-02 – 2016-05-07 (×6): 25 mg via ORAL
  Filled 2016-05-02 (×6): qty 1

## 2016-05-02 NOTE — H&P (Signed)
History and Physical    UNDRA BLAZIER [id number] DOB: 29-May-1945 DOA: 05/02/2016  Referring physician: Dr. Quentin Cornwall PCP: [name], MD  Specialists: none  Chief Complaint: CP with dizziness  HPI: Bruce [name] is a 71 y.o. male has a past medical history significant for CAD, HTN, CKD, and CML now with chest pain, dizziness, and weakness with near syncope. Currently pain-free. Troponin slightly elevated. He is now admitted. No fever. Denies SOB and palpitations. VSS  Review of Systems: The patient denies anorexia, fever, weight loss,, vision loss, decreased hearing, hoarseness,  syncope, dyspnea on exertion, peripheral edema, balance deficits, hemoptysis, abdominal pain, melena, hematochezia, severe indigestion/heartburn, hematuria, incontinence, genital sores, muscle weakness, suspicious skin lesions, transient blindness, difficulty walking, depression, unusual weight change, abnormal bleeding, enlarged lymph nodes, angioedema, and breast masses.   Past Medical History:  Diagnosis Date  . Bell's palsy   . CHF (congestive heart failure) ([facility])   . CKD (chronic kidney disease), stage II   . CML (chronic myelocytic leukemia) ([facility])   . Coronary artery disease   . Hypercholesterolemia   . Hypertension   . Leukemia ([place])   . Stroke ([place])    No residual limb weakness.  Walks with cane at baseline.   Marland Kitchen TIA (transient ischemic attack) 05/10/2014   Past Surgical History:  Procedure Laterality Date  . BACK SURGERY    . HIP ARTHROPLASTY Right    orif  . ORIF FOREARM FRACTURE Right    Social History:  reports that he has quit smoking. His smoking use included Cigarettes. He has a 25.00 pack-year smoking history. He has never used smokeless tobacco. He reports that he drinks about 0.6 oz of alcohol per week . He reports that he uses drugs, including Marijuana and Cocaine.  No Known Allergies  Family History  Problem Relation Age of Onset  . Diabetes Mother   .  Hypertension Mother   . Diabetes Father   . Hypertension Father   . Diabetes Brother   . Hypertension Brother   . Diabetes Sister   . Hypertension Sister   . Diabetes Brother   . Hypertension Brother   . Diabetes Sister   . Hypertension Sister     Prior to Admission medications   Medication Sig Start Date End Date Taking? Authorizing Provider  acetaminophen (TYLENOL) 325 MG tablet Take 2 tablets (650 mg total) by mouth every 4 (four) hours as needed for headache or mild pain. 06/06/15  Yes [name], MD  albuterol (PROVENTIL HFA;VENTOLIN HFA) 108 (90 BASE) MCG/ACT inhaler Inhale 2 puffs into the lungs every 4 (four) hours as needed for wheezing or shortness of breath. 08/08/14  Yes Noemi Chapel, MD  ALPRAZolam Duanne Moron) 0.25 MG tablet Take 0.25 mg by mouth 2 (two) times daily as needed for anxiety.   Yes Historical Provider, MD  aspirin 81 MG chewable tablet Chew 1 tablet (81 mg total) by mouth daily. 03/23/16  Yes Loletha Grayer, MD  atorvastatin (LIPITOR) 40 MG tablet Take 1 tablet (40 mg total) by mouth daily. 12/25/15  Yes Amy D Clegg, NP  carvedilol (COREG) 3.125 MG tablet Take 1 tablet (3.125 mg total) by mouth 2 (two) times daily. 12/25/15  Yes Amy D Clegg, NP  clopidogrel (PLAVIX) 75 MG tablet Take 1 tablet (75 mg total) by mouth daily. 03/21/14  Yes [name], MD  ENTRESTO 24-26 MG Take 1 tablet by mouth 2 (two) times daily. 03/19/16  Yes Historical Provider, MD  furosemide (LASIX) 80 MG tablet  Take 80 mg by mouth 2 (two) times daily. 03/19/16  Yes Historical Provider, MD  spironolactone (ALDACTONE) 25 MG tablet Take 0.5 tablets (12.5 mg total) by mouth daily. Patient taking differently: Take 25 mg by mouth daily.  03/23/16 06/21/16 Yes Richard Leslye Peer, MD  amiodarone (PACERONE) 200 MG tablet Take 1 tablet (200 mg total) by mouth daily. Patient not taking: Reported on 05/02/2016 12/30/15   Shirley Friar, PA-C  digoxin (LANOXIN) 0.125 MG tablet Take 1 tablet (0.125 mg  total) by mouth daily. Patient not taking: Reported on 05/02/2016 01/29/15   Barton Dubois, MD  torsemide (DEMADEX) 20 MG tablet Take 4 tablets (80 mg total) by mouth daily. Patient not taking: Reported on 05/02/2016 03/23/16   Loletha Grayer, MD   Physical Exam: Vitals:   05/02/16 1053  BP: 122/71  Pulse: 97  Resp: 18  Temp: 97.8 F (36.6 C)  TempSrc: Oral  SpO2: 97%  Weight: 79.8 kg (176 lb)  Height: 5\' 8"  (1.727 m)     General:  No apparent distress, WDWN, [place]/AT  Eyes: PERRL, EOMI, no scleral icterus, conjunctiva clear  ENT: moist oropharynx without exudate, TM's benign, dentition fair  Neck: supple, no lymphadenopathy. No bruits or thyromegaly  Cardiovascular: regular rate without MRG; 2+ peripheral pulses, no JVD, no peripheral edema  Respiratory: CTA biL, good air movement without wheezing, rhonchi or crackled. Respiratory effort normal  Abdomen: soft, non tender to palpation, positive bowel sounds, no guarding, no rebound  Skin: no rashes or lesions  Musculoskeletal: normal bulk and tone, no joint swelling  Psychiatric: normal mood and affect, A&OX3  Neurologic: CN 2-12 grossly intact, Motor strength 5/5 in all 4 groups with symmetric DTR's and non-focal sensory exam  Labs on Admission:  Basic Metabolic Panel:  Recent Labs Lab 05/02/16 1116  NA 139  K 3.8  CL 108  CO2 22  GLUCOSE 152*  BUN 18  CREATININE 1.04  CALCIUM 8.9   Liver Function Tests: No results for input(s): AST, ALT, ALKPHOS, BILITOT, PROT, ALBUMIN in the last 168 hours. No results for input(s): LIPASE, AMYLASE in the last 168 hours. No results for input(s): AMMONIA in the last 168 hours. CBC:  Recent Labs Lab 05/02/16 1116 05/02/16 1418  WBC 67.3* 69.4*  NEUTROABS  --  PENDING  HGB 9.4* 10.2*  HCT 29.3* 32.0*  MCV 97.4 97.9  PLT 977* 1,066*   Cardiac Enzymes:  Recent Labs Lab 05/02/16 1116  TROPONINI 0.15*    BNP (last 3 results)  Recent Labs  03/27/16 1629  04/04/16 1103 05/02/16 1115  BNP 2,790.9* 1,141.8* 2,219.0*    ProBNP (last 3 results) No results for input(s): PROBNP in the last 8760 hours.  CBG: No results for input(s): GLUCAP in the last 168 hours.  Radiological Exams on Admission: Dg Chest 2 View  Result Date: 05/02/2016 CLINICAL DATA:  Chest pain, shortness breath, and weakness for 1 week. EXAM: CHEST  2 VIEW COMPARISON:  04/29/2016 FINDINGS: Stable mild cardiomegaly. Mediastinal contours are within normal limits. Both lungs are clear. No evidence of pneumothorax or pleural effusion. Mild thoracic spine degenerative changes again noted. IMPRESSION: Stable mild cardiomegaly.  No active cardiopulmonary disease. Electronically Signed   By: Earle Gell M.D.   On: 05/02/2016 12:25   Ct Head Wo Contrast  Result Date: 05/02/2016 CLINICAL DATA:  Weakness.  History of leukemia. EXAM: CT HEAD WITHOUT CONTRAST TECHNIQUE: Contiguous axial images were obtained from the base of the skull through the vertex without intravenous contrast. COMPARISON:  03/22/2016 multiple studies as distant as 09/11/2010. FINDINGS: Brain: Generalized brain atrophy. No evidence of old or acute focal infarction, mass lesion, hemorrhage, hydrocephalus or extra-axial collection. Vascular: There is atherosclerotic calcification of the major vessels at the base of the brain. Skull: No skull fracture Sinuses/Orbits: Old bilateral orbital blowout fractures affecting the lamina papyracea up. No active sinus disease. Other: Soft tissue scalp swelling in the right parietal region without underlying skull abnormality. This is a chronic finding. IMPRESSION: No acute intracranial finding.  Age related atrophy. Old orbital medial wall blowout fractures. Right parietal scalp swelling which is chronic. No underlying skull abnormality. Electronically Signed   By: Nelson Chimes M.D.   On: 05/02/2016 13:58    EKG: Independently reviewed.  Assessment/Plan Principal Problem:   Chest  pain Active Problems:   CML (chronic myelocytic leukemia) ([facility])   Elevated troponin   Near syncope   Will observe on telemetry. Follow enzymes. Consult Cardiology and Oncology. Repeat labs in AM. IV morphine and SL NTG prn pain  Diet: heart healthy Fluids: NS@75  DVT Prophylaxis: Lovenox  Code Status: FULL  Family Communication: none  Disposition Plan: home  Time spent: 50 min

## 2016-05-02 NOTE — ED Provider Notes (Signed)
[facility] Emergency Department Provider Note    None    (approximate)  I have reviewed the triage vital signs and the nursing notes.   HISTORY  Chief Complaint Chest Pain    HPI Bruce [name] is a 71 y.o. male with very complex past medical history including congestive heart failure as well as CML and chronic kidney disease. Patient is reportedly homeless. States that he's been having multiple complaints and quitting chest pain and weakness for 1 week. He is also complaining of dizziness and confusion. A history of slurred speech from a previous stroke which is unchanged. Denies any recent fevers. States that he's been compliant with his medications. Denies any weight gain. No active chest pain at this moment. As not follow with hematology or primary care physician. Denies any nausea vomiting. No numbness or tingling.   Past Medical History:  Diagnosis Date  . Bell's palsy   . CHF (congestive heart failure) ([place])   . CKD (chronic kidney disease), stage II   . CML (chronic myelocytic leukemia) ([place])   . Coronary artery disease   . Hypercholesterolemia   . Hypertension   . Leukemia ([place])   . Stroke ([place])    No residual limb weakness.  Walks with cane at baseline.   Marland Kitchen TIA (transient ischemic attack) 05/10/2014   Family History  Problem Relation Age of Onset  . Diabetes Mother   . Hypertension Mother   . Diabetes Father   . Hypertension Father   . Diabetes Brother   . Hypertension Brother   . Diabetes Sister   . Hypertension Sister   . Diabetes Brother   . Hypertension Brother   . Diabetes Sister   . Hypertension Sister    Past Surgical History:  Procedure Laterality Date  . BACK SURGERY    . HIP ARTHROPLASTY Right    orif  . ORIF FOREARM FRACTURE Right    Patient Active Problem List   Diagnosis Date Noted  . Acute respiratory distress 03/22/2016  . Homeless 03/22/2016  . Slurred speech 03/22/2016  . History of stroke 03/22/2016    . Elevated lactic acid level 10/11/2015  . CKD (chronic kidney disease), stage II   . Dizziness 01/25/2015  . Acute on chronic combined systolic and diastolic CHF (congestive heart failure) ([place]) 01/25/2015  . Essential hypertension 01/25/2015  . Compliance poor 01/25/2015  . Coronary artery disease   . H/O: stroke with residual effects 09/13/2014  . Transaminitis 09/13/2014  . Elevated troponin 09/13/2014  . TIA (transient ischemic attack) 05/10/2014  . CML (chronic myelocytic leukemia) ([place]) 03/27/2014  . Leukocytosis 03/18/2014      Prior to Admission medications   Medication Sig Start Date End Date Taking? Authorizing Provider  acetaminophen (TYLENOL) 325 MG tablet Take 2 tablets (650 mg total) by mouth every 4 (four) hours as needed for headache or mild pain. 06/06/15   [name], MD  albuterol (PROVENTIL HFA;VENTOLIN HFA) 108 (90 BASE) MCG/ACT inhaler Inhale 2 puffs into the lungs every 4 (four) hours as needed for wheezing or shortness of breath. 08/08/14   Noemi Chapel, MD  ALPRAZolam Duanne Moron) 0.25 MG tablet Take 0.25 mg by mouth 2 (two) times daily as needed for anxiety.    Historical Provider, MD  amiodarone (PACERONE) 200 MG tablet Take 1 tablet (200 mg total) by mouth daily. 12/30/15   Shirley Friar, PA-C  aspirin 81 MG chewable tablet Chew 1 tablet (81 mg total) by mouth daily. 03/23/16  Loletha Grayer, MD  atorvastatin (LIPITOR) 40 MG tablet Take 1 tablet (40 mg total) by mouth daily. 12/25/15   Amy D Clegg, NP  carvedilol (COREG) 3.125 MG tablet Take 1 tablet (3.125 mg total) by mouth 2 (two) times daily. 12/25/15   Amy D Ninfa Meeker, NP  clopidogrel (PLAVIX) 75 MG tablet Take 1 tablet (75 mg total) by mouth daily. 03/21/14   [name], MD  digoxin (LANOXIN) 0.125 MG tablet Take 1 tablet (0.125 mg total) by mouth daily. 01/29/15   Barton Dubois, MD  sacubitril-valsartan (ENTRESTO) 49-51 MG Take 1 tablet by mouth 2 (two) times daily. 12/30/15   Shirley Friar, PA-C  spironolactone (ALDACTONE) 25 MG tablet Take 0.5 tablets (12.5 mg total) by mouth daily. Patient taking differently: Take 25 mg by mouth daily.  03/23/16 06/21/16  Loletha Grayer, MD  torsemide (DEMADEX) 20 MG tablet Take 4 tablets (80 mg total) by mouth daily. 03/23/16   Loletha Grayer, MD    Allergies Patient has no known allergies.    Social History Social History  Substance Use Topics  . Smoking status: Former Smoker    Packs/day: 0.50    Years: 50.00    Types: Cigarettes  . Smokeless tobacco: Never Used  . Alcohol use 0.6 oz/week    1 Cans of beer per week     Comment: daily     Review of Systems Patient denies headaches, rhinorrhea, blurry vision, numbness, shortness of breath, chest pain, edema, cough, abdominal pain, nausea, vomiting, diarrhea, dysuria, fevers, rashes or hallucinations unless otherwise stated above in HPI. ____________________________________________   PHYSICAL EXAM:  VITAL SIGNS: Vitals:   05/02/16 1053  BP: 122/71  Pulse: 97  Resp: 18  Temp: 97.8 F (36.6 C)    Constitutional: Alert and oriented. Chronically ill appearing and in no acute distress. Eyes: Conjunctivae are normal. PERRL. EOMI. Head: Atraumatic. Nose: No congestion/rhinnorhea. Mouth/Throat: Mucous membranes are moist.  Oropharynx non-erythematous. Neck: No stridor. Painless ROM. No cervical spine tenderness to palpation Hematological/Lymphatic/Immunilogical: No cervical lymphadenopathy. Cardiovascular: Normal rate, regular rhythm. Grossly normal heart sounds.  Good peripheral circulation. Respiratory: Normal respiratory effort.  No retractions. Lungs CTAB. Gastrointestinal: Soft and nontender. No distention. No abdominal bruits. No CVA tenderness. Musculoskeletal: No lower extremity tenderness nor edema.  No joint effusions. Neurologic:  Normal speech and language. No gross focal neurologic deficits are appreciated. No gait instability. Skin:  Skin is warm,  dry and intact. No rash noted. Psychiatric: Mood and affect are normal. Speech and behavior are normal. ____________________________________________   LABS (all labs ordered are listed, but only abnormal results are displayed)  Results for orders placed or performed during the hospital encounter of 05/02/16 (from the past 24 hour(s))  Brain natriuretic peptide     Status: Abnormal   Collection Time: 05/02/16 11:15 AM  Result Value Ref Range   B Natriuretic Peptide 2,219.0 (H) 0.0 - 100.0 pg/mL  Basic metabolic panel     Status: Abnormal   Collection Time: 05/02/16 11:16 AM  Result Value Ref Range   Sodium 139 135 - 145 mmol/L   Potassium 3.8 3.5 - 5.1 mmol/L   Chloride 108 101 - 111 mmol/L   CO2 22 22 - 32 mmol/L   Glucose, Bld 152 (H) 65 - 99 mg/dL   BUN 18 6 - 20 mg/dL   Creatinine, Ser 1.04 0.61 - 1.24 mg/dL   Calcium 8.9 8.9 - 10.3 mg/dL   GFR calc non Af Amer >60 >60 mL/min   GFR  calc Af Amer >60 >60 mL/min   Anion gap 9 5 - 15  CBC     Status: Abnormal   Collection Time: 05/02/16 11:16 AM  Result Value Ref Range   WBC 67.3 (HH) 3.8 - 10.6 K/uL   RBC 3.01 (L) 4.40 - 5.90 MIL/uL   Hemoglobin 9.4 (L) 13.0 - 18.0 g/dL   HCT 29.3 (L) 40.0 - 52.0 %   MCV 97.4 80.0 - 100.0 fL   MCH 31.2 26.0 - 34.0 pg   MCHC 32.1 32.0 - 36.0 g/dL   RDW 18.5 (H) 11.5 - 14.5 %   Platelets 977 (HH) 150 - 440 K/uL  Troponin I     Status: Abnormal   Collection Time: 05/02/16 11:16 AM  Result Value Ref Range   Troponin I 0.15 (HH) <0.03 ng/mL   ____________________________________________  EKG My review and personal interpretation at Time: 11:14   Indication: chest pain  Rate: 90  Rhythm: sinsus Axis: normal Other: non specific st and t wave changes, no acute STEMI ____________________________________________  RADIOLOGY  I personally reviewed all radiographic images ordered to evaluate for the above acute complaints and reviewed radiology reports and findings.  These findings were  personally discussed with the patient.  Please see medical record for radiology report.  ____________________________________________   PROCEDURES  Procedure(s) performed:  Procedures    Critical Care performed: no ____________________________________________   INITIAL IMPRESSION / ASSESSMENT AND PLAN / ED COURSE  Pertinent labs & imaging results that were available during my care of the patient were reviewed by me and considered in my medical decision making (see chart for details).  DDX: acs, dysrhythmia, dehydration, malignancy, hyperviscosity syndrome, acute clot crisis, sepsis  Bruce [name] is a 71 y.o. who presents to the ED with multiple complaints complaining of chest pain and weakness 1 week. Patient with complex past medical history including presumed diagnosis of CML the patient has no formal confirmatory testing. He arrives afebrile and hemodynamic stable. Blood count seen to be worsening over the past several months. Due to concern for acute occlusive crisis CT imaging ordered to evaluate for stroke shows no acute abnormality. Chest x-ray performed shows enlarged cardiac silhouette but no overlying edema. Patient denies any shortness of breath and does not have any hypoxia but is having intermittent chest pain. His EKG shows no evidence of acute ischemia but his troponin is elevated to 0.15 which is higher than his baseline troponin elevation. Do not feel that this represents acute hyperviscosity syndrome based on the duration of symptoms and progressive worsening. Due to his heart failure do not want to give fluids to dilute any thrombocytosis. Will give aspirin. Based on his elevation in troponin and high wrist do feel patient would benefit from chest pain rule out.  Have discussed with the patient and available family all diagnostics and treatments performed thus far and all questions were answered to the best of my ability. The patient demonstrates understanding and  agreement with plan.       ____________________________________________   FINAL CLINICAL IMPRESSION(S) / ED DIAGNOSES  Final diagnoses:  Chest pain, unspecified type  Leukocytosis, unspecified type  Thrombocytosis ([place])      NEW MEDICATIONS STARTED DURING THIS VISIT:  New Prescriptions   No medications on file     Note:  This document was prepared using Dragon voice recognition software and may include unintentional dictation errors.    Merlyn Lot, MD 05/02/16 [phone]

## 2016-05-02 NOTE — ED Triage Notes (Signed)
Pt c/o chest pain and weak X 1 week. Was seen at [facility]. Pain is intermittent. Has had some SHOB. No distress at this time. Has slurred speech from previous stroke per pt. Also has had some leg swelling to both legs per pt.

## 2016-05-03 ENCOUNTER — Observation Stay: Payer: Medicare Other

## 2016-05-03 ENCOUNTER — Encounter: Payer: Self-pay | Admitting: Physician Assistant

## 2016-05-03 DIAGNOSIS — R748 Abnormal levels of other serum enzymes: Secondary | ICD-10-CM

## 2016-05-03 DIAGNOSIS — I5043 Acute on chronic combined systolic (congestive) and diastolic (congestive) heart failure: Secondary | ICD-10-CM | POA: Diagnosis not present

## 2016-05-03 LAB — URINALYSIS, COMPLETE (UACMP) WITH MICROSCOPIC
Bacteria, UA: NONE SEEN
Bilirubin Urine: NEGATIVE
GLUCOSE, UA: NEGATIVE mg/dL
HGB URINE DIPSTICK: NEGATIVE
KETONES UR: NEGATIVE mg/dL
Leukocytes, UA: NEGATIVE
NITRITE: NEGATIVE
PH: 5 (ref 5.0–8.0)
Protein, ur: NEGATIVE mg/dL
RBC / HPF: NONE SEEN RBC/hpf (ref 0–5)
Specific Gravity, Urine: 1.005 (ref 1.005–1.030)
Squamous Epithelial / HPF: NONE SEEN
WBC UA: NONE SEEN WBC/hpf (ref 0–5)

## 2016-05-03 LAB — TROPONIN I
Troponin I: 0.14 ng/mL (ref <0.03)
Troponin I: 0.14 ng/mL (ref <0.03)

## 2016-05-03 LAB — COMPREHENSIVE METABOLIC PANEL
ALK PHOS: 235 U/L — AB (ref 38–126)
ALT: 21 U/L (ref 17–63)
AST: 37 U/L (ref 15–41)
Albumin: 3.4 g/dL — ABNORMAL LOW (ref 3.5–5.0)
Anion gap: 8 (ref 5–15)
BILIRUBIN TOTAL: 1.8 mg/dL — AB (ref 0.3–1.2)
BUN: 18 mg/dL (ref 6–20)
CO2: 23 mmol/L (ref 22–32)
CREATININE: 0.93 mg/dL (ref 0.61–1.24)
Calcium: 8.5 mg/dL — ABNORMAL LOW (ref 8.9–10.3)
Chloride: 107 mmol/L (ref 101–111)
GFR calc Af Amer: >60 mL/min (ref >60)
Glucose, Bld: 125 mg/dL — ABNORMAL HIGH (ref 65–99)
Potassium: 3.9 mmol/L (ref 3.5–5.1)
Sodium: 138 mmol/L (ref 135–145)
TOTAL PROTEIN: 7.1 g/dL (ref 6.5–8.1)

## 2016-05-03 LAB — CBC
HCT: 28.4 % — ABNORMAL LOW (ref 40.0–52.0)
Hemoglobin: 9.3 g/dL — ABNORMAL LOW (ref 13.0–18.0)
MCH: 31.7 pg (ref 26.0–34.0)
MCHC: 32.9 g/dL (ref 32.0–36.0)
MCV: 96.6 fL (ref 80.0–100.0)
PLATELETS: 941 10*3/uL — AB (ref 150–440)
RBC: 2.94 MIL/uL — ABNORMAL LOW (ref 4.40–5.90)
RDW: 18.6 % — AB (ref 11.5–14.5)
WBC: 58 10*3/uL — AB (ref 3.8–10.6)

## 2016-05-03 LAB — URINE DRUG SCREEN, QUALITATIVE (ARMC ONLY)
Amphetamines, Ur Screen: NOT DETECTED
Barbiturates, Ur Screen: NOT DETECTED
Benzodiazepine, Ur Scrn: NOT DETECTED
CANNABINOID 50 NG, UR ~~LOC~~: NOT DETECTED
Cocaine Metabolite,Ur ~~LOC~~: NOT DETECTED
MDMA (Ecstasy)Ur Screen: NOT DETECTED
Methadone Scn, Ur: NOT DETECTED
OPIATE, UR SCREEN: NOT DETECTED
PHENCYCLIDINE (PCP) UR S: NOT DETECTED
Tricyclic, Ur Screen: NOT DETECTED

## 2016-05-03 LAB — GLUCOSE, CAPILLARY
GLUCOSE-CAPILLARY: 168 mg/dL — AB (ref 65–99)
GLUCOSE-CAPILLARY: 130 mg/dL — AB (ref 65–99)
Glucose-Capillary: 123 mg/dL — ABNORMAL HIGH (ref 65–99)

## 2016-05-03 LAB — PATHOLOGIST SMEAR REVIEW

## 2016-05-03 MED ORDER — LORAZEPAM 2 MG/ML IJ SOLN
0.5000 mg | Freq: Once | INTRAMUSCULAR | Status: AC
  Administered 2016-05-03: 0.5 mg via INTRAVENOUS
  Filled 2016-05-03: qty 1

## 2016-05-03 MED ORDER — FUROSEMIDE 10 MG/ML IJ SOLN
40.0000 mg | Freq: Two times a day (BID) | INTRAMUSCULAR | Status: DC
  Administered 2016-05-03 – 2016-05-05 (×4): 40 mg via INTRAVENOUS
  Filled 2016-05-03 (×4): qty 4

## 2016-05-03 NOTE — Progress Notes (Signed)
Patient ID: Bruce [name], male   DOB: Oct 03, 1945, 71 y.o.   MRN: [id number]  [facility] PROGRESS NOTE  Bruce [name] [id number] DOB: 03/13/1946 DOA: 05/02/2016 PCP: [name], MD  HPI/Subjective: Patient is feeling better with regards to his breathing today. Received IV Lasix in the emergency room and this morning. Still having slurred speech which is nothing new. Last admission he refused an MRI secondary to claustrophobia. Left arm weakness.  Objective: Vitals:   05/03/16 0725 05/03/16 1152  BP: 113/82 (!) 103/58  Pulse: 81 67  Resp: 20 20  Temp:  97.6 F (36.4 C)    Filed Weights   05/02/16 1053 05/03/16 0340  Weight: 79.8 kg (176 lb) 79.6 kg (175 lb 8 oz)    ROS: Review of Systems  Constitutional: Negative for chills and fever.  Eyes: Negative for blurred vision.  Respiratory: Positive for cough and shortness of breath.   Cardiovascular: Negative for chest pain.  Gastrointestinal: Negative for abdominal pain, constipation, diarrhea, nausea and vomiting.  Genitourinary: Negative for dysuria.  Musculoskeletal: Negative for joint pain.  Neurological: Positive for speech change and focal weakness. Negative for dizziness and headaches.   Exam: Physical Exam    Data Reviewed: Basic Metabolic Panel:  Recent Labs Lab 05/02/16 1116 05/03/16 0701  NA 139 138  K 3.8 3.9  CL 108 107  CO2 22 23  GLUCOSE 152* 125*  BUN 18 18  CREATININE 1.04 0.93  CALCIUM 8.9 8.5*   Liver Function Tests:  Recent Labs Lab 05/03/16 0701  AST 37  ALT 21  ALKPHOS 235*  BILITOT 1.8*  PROT 7.1  ALBUMIN 3.4*   CBC:  Recent Labs Lab 05/02/16 1116 05/02/16 1418 05/03/16 0701  WBC 67.3* 69.4* 58.0*  NEUTROABS  --  59.7*  --   HGB 9.4* 10.2* 9.3*  HCT 29.3* 32.0* 28.4*  MCV 97.4 97.9 96.6  PLT 977* 1,066* 941*   Cardiac Enzymes:  Recent Labs Lab 05/02/16 1116 05/02/16 1928 05/03/16 0103 05/03/16 0701  TROPONINI 0.15* 0.16* 0.14* 0.14*   BNP  (last 3 results)  Recent Labs  03/27/16 1629 04/04/16 1103 05/02/16 1115  BNP 2,790.9* 1,141.8* 2,219.0*     CBG:  Recent Labs Lab 05/03/16 0757 05/03/16 1152  GLUCAP 123* 168*     Studies: Dg Chest 2 View  Result Date: 05/02/2016 CLINICAL DATA:  Chest pain, shortness breath, and weakness for 1 week. EXAM: CHEST  2 VIEW COMPARISON:  04/29/2016 FINDINGS: Stable mild cardiomegaly. Mediastinal contours are within normal limits. Both lungs are clear. No evidence of pneumothorax or pleural effusion. Mild thoracic spine degenerative changes again noted. IMPRESSION: Stable mild cardiomegaly.  No active cardiopulmonary disease. Electronically Signed   By: Earle Gell M.D.   On: 05/02/2016 12:25   Ct Head Wo Contrast  Result Date: 05/02/2016 CLINICAL DATA:  Weakness.  History of leukemia. EXAM: CT HEAD WITHOUT CONTRAST TECHNIQUE: Contiguous axial images were obtained from the base of the skull through the vertex without intravenous contrast. COMPARISON:  03/22/2016 multiple studies as distant as 09/11/2010. FINDINGS: Brain: Generalized brain atrophy. No evidence of old or acute focal infarction, mass lesion, hemorrhage, hydrocephalus or extra-axial collection. Vascular: There is atherosclerotic calcification of the major vessels at the base of the brain. Skull: No skull fracture Sinuses/Orbits: Old bilateral orbital blowout fractures affecting the lamina papyracea up. No active sinus disease. Other: Soft tissue scalp swelling in the right parietal region without underlying skull abnormality. This is a chronic finding. IMPRESSION: No acute  intracranial finding.  Age related atrophy. Old orbital medial wall blowout fractures. Right parietal scalp swelling which is chronic. No underlying skull abnormality. Electronically Signed   By: Nelson Chimes M.D.   On: 05/02/2016 13:58    Scheduled Meds: . aspirin  81 mg Oral Daily  . atorvastatin  40 mg Oral Daily  . carvedilol  3.125 mg Oral BID  .  clopidogrel  75 mg Oral Daily  . docusate sodium  100 mg Oral BID  . enoxaparin (LOVENOX) injection  40 mg Subcutaneous Q24H  . furosemide  40 mg Intravenous BID  . pantoprazole  40 mg Oral Daily  . sacubitril-valsartan  1 tablet Oral BID  . sodium chloride flush  3 mL Intravenous Q12H  . spironolactone  25 mg Oral Daily   Continuous Infusions:  Assessment/Plan:  1. Acute on chronic systolic congestive heart failure. Patient diuresed well so far. Compliance with medications could be the issues since he is homeless. He states he is now found a place to live. Potential discharge tomorrow. Continue IV Lasix, entresto and Coreg 2. Elevated troponin likely demand ischemia with heart failure 3. Slurred speech which is chronic. Continue aspirin and Plavix. MRI of the brain 4. Hyperlipidemia unspecified on atorvastatin 5. History of CML with leukocytosis and thrombocytosis  Code Status:     Code Status Orders        Start     Ordered   05/02/16 1900  Full code  Continuous     05/02/16 1859    Code Status History    Date Active Date Inactive Code Status Order ID Comments User Context   03/21/2016  6:40 PM 03/23/2016  6:39 PM Full Code [id number]  Idelle Crouch, MD Inpatient   10/10/2015 11:59 PM 10/15/2015  7:08 PM Full Code [id number]  Ivor Costa, MD ED   06/04/2015  1:54 AM 06/06/2015  5:28 PM Full Code [id number]  [name], MD ED   01/25/2015  3:16 AM 01/29/2015  5:17 PM Full Code [id number]  Lavina Hamman, MD ED   09/13/2014  5:07 PM 09/22/2014  6:09 PM Full Code [id number]  Flonnie Overman Dhungel, MD Inpatient   03/20/2014 11:33 PM 03/21/2014  5:46 PM Full Code [id number]  Arne Cleveland, MD Inpatient   03/18/2014 11:46 PM 03/20/2014 11:33 PM Full Code [id number]  [name], MD Inpatient   03/05/2014  9:04 PM 03/06/2014  6:57 PM Full Code [id number]  [name], MD Inpatient      Disposition Plan: Potential discharge tomorrow  Consultants:  Cardiology  Time spent: 28  minutes  [place], [facility]

## 2016-05-03 NOTE — Discharge Instructions (Signed)
Heart Failure Clinic appointment on May 11, 2016 at 1:00pm with Darylene Price, [place]. Please call [phone] to reschedule.

## 2016-05-03 NOTE — Progress Notes (Signed)
Initial Heart Failure Clinic appointment scheduled for May 11, 2016 at 1:00pm. Thank you.

## 2016-05-03 NOTE — Progress Notes (Signed)
Nutrition Brief Note  Patient identified on the Malnutrition Screening Tool (MST) Report  Wt Readings from Last 15 Encounters:  05/03/16 175 lb 8 oz (79.6 kg)  04/29/16 178 lb 9.6 oz (81 kg)  04/04/16 171 lb 1 oz (77.6 kg)  03/27/16 173 lb (78.5 kg)  03/23/16 173 lb 3.2 oz (78.6 kg)  03/15/16 150 lb (68 kg)  12/30/15 157 lb 9.6 oz (71.5 kg)  12/24/15 160 lb 12.8 oz (72.9 kg)  12/17/15 166 lb 8 oz (75.5 kg)  11/22/15 167 lb 11.2 oz (76.1 kg)  10/15/15 158 lb 8 oz (71.9 kg)  08/22/15 173 lb (78.5 kg)  08/20/15 173 lb (78.5 kg)  08/16/15 175 lb 6.4 oz (79.6 kg)  07/11/15 171 lb (77.6 kg)    Body mass index is 26.68 kg/m. Patient meets criteria for overweight based on current BMI.   Current diet order is heart healthy, patient is consuming approximately 100% of meals at this time. Labs and medications reviewed.   Patient was requesting peanut butter and crackers during visit. Nutrition focused physical exam WDL Weight stable following his strokes.  No nutrition interventions warranted at this time. If nutrition issues arise, please consult RD.   Bruce [name]. Trask Vosler, MS, RD LDN Inpatient Clinical Dietitian Pager [phone]

## 2016-05-03 NOTE — Consult Note (Signed)
Cardiology Consultation Note  Patient ID: TEANDRE HAMRE, MRN: [id number], DOB/AGE: [birth year] 71 y.o. Admit date: 05/02/2016   Date of Consult: 05/03/2016 Primary Physician: [name], MD Primary Cardiologist: Dr. Terrence Dupont, MD Requesting Physician: Dr. Doy Hutching, MD  Chief Complaint: SOB/chest pain Reason for Consult: Acute on chronic combined CHF  HPI: 71 y.o. male with h/o chronic combined CHF class III, NICM with cardiac cath in 2008 showing nonobstructive CAD, previously homeless in November and December 2017 now living in a church, CKD stage II-III, medication noncompliance, CVA x 2 without Afib work up, atrial tachycardia on amiodarone, HTN, ETOH abuse, and CML not currently under treatment who was recently admitted in mid December 2017 for volume overload returned to [facility] with increased SOB and nonexertional chest pain.   Prior echo in 01/2015 showed an EF of 15% with diffuse hypokinesis, moderate TR, PASP 45 mmHg. Echo from 10/2015 showed an EF of 20-25%, diffuse hypokinesis, mild MR, mildly dilated left atrium, severely reduced RV systolic function, moderate TR, PASP 58 mmHg. He has been managed on Coreg, Entresto, digoxin, spironolactone, and torsemide as an outpatient, though has had compliance issues 2/2 his living situation. He has not followed up with [facility] for his CML since 03/2015. Prior BNP of 3489 in August 2017. He reports a dry weight of 165 pounds. Recently admitted in mid December 2017 in SOB with a weight of 181 pounds. Echo that admission showed an EF of 15-20%, diffuse hypokinesis, unable to assess diastolic function, mild MR, LA mild to moderately dilated, RV systolic function was moderately reduced, moderate to severe TR, PASP 50 mmHg. He was diuresed successfully. He did not show for his hospital follow up.   He reports seeing his primary cardiologist as an outpatient since he was last admitted. We do not have these notes.   He was recently seen in the [place] ED  for 2nd opinion of his slurred speech and most recently in the [facility] ED for multiple complaints including cough, abdominal hernia, and jaw pain. He reported just wanting to take medications and go home.   Patient again with multiple complaints including SOB, chest pain, abdominal swelling, and abdominal hernia. He is now living in a church. He is not certain what medications he takes or how often he takes them. He does not follow a heart healthy diet. He is uncertain what his weight has been at home. Most recent ED visit with a weight of 178 pounds and an admission weight this time of 175 pounds.   Upon the patient's arrival to [facility] they were found to have a mildly elevated, flat trending troponin with a peak of 0.16 now down trending. CBC markedly abnormal given CML with WBC of 69.4, hgb 10.2, plt 1,066. BNP 2219, SCr 0.93, K+ 3.9, Alk phose 235, path review blood smear pending. ECG as below, CXR showed stable cardiomegaly without active cardiopulmonary disease. CT head non-acute. He was admitted overnight and cardiology consult was placed.   Remains SOB this morning or room air. No chest pain this morning. Labs as above.    Past Medical History:  Diagnosis Date  . Bell's palsy   . Chronic combined systolic and diastolic CHF, NYHA class 3 ([place])   . CKD (chronic kidney disease), stage II   . CML (chronic myelocytic leukemia) ([place])   . Coronary artery disease, non-occlusive   . Hypercholesterolemia   . Hypertension   . Leukemia ([place])   . Stroke ([place])    No residual limb weakness.  Walks with cane at baseline.   Marland Kitchen TIA (transient ischemic attack) 05/10/2014      Most Recent Cardiac Studies: Echo 03/2016: Study Conclusions  - Left ventricle: The cavity size was mildly dilated. Wall   thickness was normal. Systolic function was severely reduced. The   estimated ejection fraction was in the range of 15% to 20%.   Diffuse hypokinesis. The study is not technically sufficient to   allow  evaluation of LV diastolic function. - Mitral valve: There was mild regurgitation. - Left atrium: The atrium was mildly to moderately dilated. - Right ventricle: The cavity size was mildly dilated. Wall   thickness was normal. Systolic function was moderately reduced. - Right atrium: The atrium was moderately dilated. - Tricuspid valve: Mildly thickened leaflets. There was   moderate-severe regurgitation. - Pulmonic valve: There was moderate regurgitation. - Pulmonary arteries: Systolic pressure was moderately increased.   PA peak pressure: 50 mm Hg (S).   Surgical History:  Past Surgical History:  Procedure Laterality Date  . BACK SURGERY    . HIP ARTHROPLASTY Right    orif  . ORIF FOREARM FRACTURE Right      Home Meds: Prior to Admission medications   Medication Sig Start Date End Date Taking? Authorizing Provider  acetaminophen (TYLENOL) 325 MG tablet Take 2 tablets (650 mg total) by mouth every 4 (four) hours as needed for headache or mild pain. 06/06/15  Yes [name], MD  albuterol (PROVENTIL HFA;VENTOLIN HFA) 108 (90 BASE) MCG/ACT inhaler Inhale 2 puffs into the lungs every 4 (four) hours as needed for wheezing or shortness of breath. 08/08/14  Yes Noemi Chapel, MD  ALPRAZolam Duanne Moron) 0.25 MG tablet Take 0.25 mg by mouth 2 (two) times daily as needed for anxiety.   Yes Historical Provider, MD  aspirin 81 MG chewable tablet Chew 1 tablet (81 mg total) by mouth daily. 03/23/16  Yes Loletha Grayer, MD  atorvastatin (LIPITOR) 40 MG tablet Take 1 tablet (40 mg total) by mouth daily. 12/25/15  Yes Amy D Clegg, NP  carvedilol (COREG) 3.125 MG tablet Take 1 tablet (3.125 mg total) by mouth 2 (two) times daily. 12/25/15  Yes Amy D Clegg, NP  clopidogrel (PLAVIX) 75 MG tablet Take 1 tablet (75 mg total) by mouth daily. 03/21/14  Yes [name], MD  ENTRESTO 24-26 MG Take 1 tablet by mouth 2 (two) times daily. 03/19/16  Yes Historical Provider, MD  furosemide (LASIX) 80 MG tablet Take  80 mg by mouth 2 (two) times daily. 03/19/16  Yes Historical Provider, MD  spironolactone (ALDACTONE) 25 MG tablet Take 0.5 tablets (12.5 mg total) by mouth daily. Patient taking differently: Take 25 mg by mouth daily.  03/23/16 06/21/16 Yes Richard Leslye Peer, MD  amiodarone (PACERONE) 200 MG tablet Take 1 tablet (200 mg total) by mouth daily. Patient not taking: Reported on 05/02/2016 12/30/15   Shirley Friar, PA-C  digoxin (LANOXIN) 0.125 MG tablet Take 1 tablet (0.125 mg total) by mouth daily. Patient not taking: Reported on 05/02/2016 01/29/15   Barton Dubois, MD  torsemide (DEMADEX) 20 MG tablet Take 4 tablets (80 mg total) by mouth daily. Patient not taking: Reported on 05/02/2016 03/23/16   Loletha Grayer, MD    Inpatient Medications:  . aspirin  81 mg Oral Daily  . atorvastatin  40 mg Oral Daily  . carvedilol  3.125 mg Oral BID  . clopidogrel  75 mg Oral Daily  . digoxin  0.125 mg Oral Daily  . docusate sodium  100 mg Oral BID  . enoxaparin (LOVENOX) injection  40 mg Subcutaneous Q24H  . furosemide  80 mg Oral BID  . pantoprazole  40 mg Oral Daily  . sacubitril-valsartan  1 tablet Oral BID  . sodium chloride flush  3 mL Intravenous Q12H  . spironolactone  25 mg Oral Daily     Allergies: No Known Allergies  Social History   Social History  . Marital status: Widowed    Spouse name: N/A  . Number of children: 0  . Years of education: N/A   Occupational History  . retired    Social History Main Topics  . Smoking status: Former Smoker    Packs/day: 0.50    Years: 50.00    Types: Cigarettes  . Smokeless tobacco: Never Used  . Alcohol use 0.6 oz/week    1 Cans of beer per week     Comment: daily   . Drug use: Yes    Types: Marijuana, Cocaine  . Sexual activity: No   Other Topics Concern  . Not on file   Social History Narrative   Patient is right handed.   Patient drinks 1-2 cups daily.     Family History  Problem Relation Age of Onset  . Diabetes  Mother   . Hypertension Mother   . Diabetes Father   . Hypertension Father   . Diabetes Brother   . Hypertension Brother   . Diabetes Sister   . Hypertension Sister   . Diabetes Brother   . Hypertension Brother   . Diabetes Sister   . Hypertension Sister      Review of Systems: Review of Systems  Constitutional: Positive for malaise/fatigue. Negative for chills, diaphoresis, fever and weight loss.  HENT: Negative for congestion.   Eyes: Negative for discharge and redness.  Respiratory: Positive for cough, shortness of breath and wheezing. Negative for hemoptysis and sputum production.   Cardiovascular: Positive for chest pain. Negative for palpitations, orthopnea, claudication, leg swelling and PND.  Gastrointestinal: Negative for abdominal pain, blood in stool, constipation, diarrhea, heartburn, melena, nausea and vomiting.  Genitourinary: Negative for hematuria.  Musculoskeletal: Negative for falls and myalgias.  Skin: Negative for rash.  Neurological: Positive for weakness. Negative for dizziness, tingling, tremors, sensory change, speech change, focal weakness and loss of consciousness.  Endo/Heme/Allergies: Does not bruise/bleed easily.  Psychiatric/Behavioral: Negative for substance abuse. The patient is not nervous/anxious.   All other systems reviewed and are negative.   Labs:  Recent Labs  05/02/16 1116 05/02/16 1928 05/03/16 0103 05/03/16 0701  TROPONINI 0.15* 0.16* 0.14* 0.14*   Lab Results  Component Value Date   WBC 58.0 (HH) 05/03/2016   HGB 9.3 (L) 05/03/2016   HCT 28.4 (L) 05/03/2016   MCV 96.6 05/03/2016   PLT 941 (HH) 05/03/2016     Recent Labs Lab 05/03/16 0701  NA 138  K 3.9  CL 107  CO2 23  BUN 18  CREATININE 0.93  CALCIUM 8.5*  PROT 7.1  BILITOT 1.8*  ALKPHOS 235*  ALT 21  AST 37  GLUCOSE 125*   Lab Results  Component Value Date   CHOL 212 (H) 03/06/2014   HDL 57 03/06/2014   LDLCALC 131 (H) 03/06/2014   TRIG 122 03/06/2014    Lab Results  Component Value Date   DDIMER 1.95 (H) 12/03/2015    Radiology/Studies:  Dg Chest 2 View  Result Date: 05/02/2016 CLINICAL DATA:  Chest pain, shortness breath, and weakness for 1 week. EXAM: CHEST  2 VIEW COMPARISON:  04/29/2016 FINDINGS: Stable mild cardiomegaly. Mediastinal contours are within normal limits. Both lungs are clear. No evidence of pneumothorax or pleural effusion. Mild thoracic spine degenerative changes again noted. IMPRESSION: Stable mild cardiomegaly.  No active cardiopulmonary disease. Electronically Signed   By: Earle Gell M.D.   On: 05/02/2016 12:25   Dg Chest 2 View  Result Date: 04/29/2016 CLINICAL DATA:  Cough. EXAM: CHEST  2 VIEW COMPARISON:  04/04/2016 . FINDINGS: Mediastinum hilar structures normal. Cardiomegaly with mild pulmonary vascular prominence. No focal infiltrate. No pleural effusion or pneumothorax. IMPRESSION: Cardiomegaly with mild pulmonary vascular prominence. No focal infiltrate. Electronically Signed   By: Marcello Moores  Register   On: 04/29/2016 12:32   Dg Chest 2 View  Result Date: 04/04/2016 CLINICAL DATA:  Bilateral lower extremity swelling for 1 month. Cough and shortness of breath for 1 week. EXAM: CHEST  2 VIEW COMPARISON:  PA and lateral chest 03/27/2016.  CT chest 12/03/2015. FINDINGS: There is cardiomegaly without edema. Lungs are clear. No pneumothorax or pleural effusion. No focal bony abnormality. IMPRESSION: Cardiomegaly without acute disease. Electronically Signed   By: Inge Rise M.D.   On: 04/04/2016 11:52   Ct Head Wo Contrast  Result Date: 05/02/2016 CLINICAL DATA:  Weakness.  History of leukemia. EXAM: CT HEAD WITHOUT CONTRAST TECHNIQUE: Contiguous axial images were obtained from the base of the skull through the vertex without intravenous contrast. COMPARISON:  03/22/2016 multiple studies as distant as 09/11/2010. FINDINGS: Brain: Generalized brain atrophy. No evidence of old or acute focal infarction, mass lesion,  hemorrhage, hydrocephalus or extra-axial collection. Vascular: There is atherosclerotic calcification of the major vessels at the base of the brain. Skull: No skull fracture Sinuses/Orbits: Old bilateral orbital blowout fractures affecting the lamina papyracea up. No active sinus disease. Other: Soft tissue scalp swelling in the right parietal region without underlying skull abnormality. This is a chronic finding. IMPRESSION: No acute intracranial finding.  Age related atrophy. Old orbital medial wall blowout fractures. Right parietal scalp swelling which is chronic. No underlying skull abnormality. Electronically Signed   By: Nelson Chimes M.D.   On: 05/02/2016 13:58    EKG: Interpreted by me showed: NSR, 91 bpm, left anterior fascicular block, prolonged QT, poor R wave progression, nonspecific st/t changes Telemetry: Not on telemetry.   Weights: Filed Weights   05/02/16 1053 05/03/16 0340  Weight: 176 lb (79.8 kg) 175 lb 8 oz (79.6 kg)     Physical Exam: Blood pressure 100/67, pulse 78, temperature 98.3 F (36.8 C), temperature source Oral, resp. rate 18, height _0  (1.727 m), weight 175 lb 8 oz (79.6 kg), SpO2 98 %. Body mass index is 26.68 kg/m. General: Well developed, well nourished, in no acute distress. Head: Normocephalic, atraumatic, sclera non-icteric, no xanthomas, nares are without discharge.  Neck: Negative for carotid bruits. JVD not elevated. Lungs: Clear bilaterally to auscultation without wheezes, rales, or rhonchi. Breathing is unlabored. Heart: RRR with S1 S2. II/VI systolic murmur, no rubs, or gallops appreciated. Abdomen: Soft, non-tender, distended with normoactive bowel sounds. No hepatomegaly. No rebound/guarding. No obvious abdominal masses. Msk:  Strength and tone appear normal for age. Extremities: No clubbing or cyanosis. No edema. Distal pedal pulses are 2+ and equal bilaterally. Neuro: Alert and oriented X 3. No facial asymmetry. No focal deficit. Moves all  extremities spontaneously. Psych:  Responds to questions appropriately with a normal affect.    Assessment and Plan:  Principal Problem:   Acute on chronic combined systolic and diastolic  CHF, NYHA class 3 (H[facility]) Active Problems:   CML (chronic myelocytic leukemia) (H[facility])   Coronary artery disease, non-occlusive   Homeless   Elevated troponin   Compliance poor   CKD (chronic kidney disease), stage II   Chest pain   Near syncope    1. Acute on chronic combined CHF, NYHA class III: -Start IV diuresis with Lasix 40 mg bid with KCl repletion -Monitor UOP and daily weights -His compliance at home is uncertain -Continue Coreg, Entresto, spironolactone  -Consider discontinuation of digoxin given compliance issues -No need to repeat echo given recent study from 03/2016  2. Elevated troponin/chest pain: -Currently chest pain free -Continue ASA and Plavix -As below  3. NICM: -By cardiac cath in 2008 -Consider nuclear stress test prior to discharge once breathing is at baseline  4. Near syncope: -Orthostatic vital signs ordered -Await results  5. CML: -Oncology has been consulted   6. Noncompliance with medical regimen presenting significant health hazards: -Compliance advised   7. CKD stage II: -Stable   Signed, Christell Faith, PA-C [facility] Pager: [phone] 05/03/2016, 8:10 AM

## 2016-05-03 NOTE — Plan of Care (Signed)
Problem: Fluid Volume: Goal: Ability to maintain a balanced intake and output will improve Outcome: Progressing Patient is diuresing well on IV Lasix

## 2016-05-03 NOTE — Evaluation (Signed)
Physical Therapy Evaluation Patient Details Name: Bruce [name] MRN: [id number] DOB: 1945/09/14 Today's Date: 05/03/2016   History of Present Illness  Pt is a 71 yo male w/ significant history of CML, CAD, HTN, CKD, who came into the hospital w/ complaints of; weaknes, dizziness, and chest pain.   Clinical Impression  Pt sleeping but easily aroused and was appropriate for PT eval. Pt denied pain and stated he felt weak. Pt demonstrated strength deficits; grossly 4/5 for bilat UE and grossly at least 3/5 for bilat LE . BP monitored in supine to be 93/58. Bed mobility assessed and pt able to transfer to sitting w/ modified independence using bed rails. Once sitting BP changed to 90/76.  pt required single UE support to maintain sitting posture and complained of dizziness. BP was reassessed in sitting and changed to 100/57. Pt able to transfer to standing w/ RW and min guard; BP in standing was 97/77 . Pt ambulated w/ RW and min guard and required a chair follow for safety. Pt able to ambulate 200' w/ a 1 ft/second gait speed indicative of poor community ambulation and an increased risk for falls. Pt required cuing and education on use of RW and had difficulty remaining inside the walker during ambulation, indicating poor safety awareness in ambulation. He also would drift occasionally and require cuing to correct gait deviations. Pt displayed fatigue during ambulation and required a 60 second standing break due to fatigue, O2 was measured to be 100%. Pt currently displays strength, balance and ambulatory deficits that make it unsafe to return to prior living environment (homeless) and he would benefit from skilled PT. Recommend d/c to STR following hospital stay.     Follow Up Recommendations SNF    Equipment Recommendations  Rolling walker with 5" wheels    Recommendations for Other Services       Precautions / Restrictions Precautions Precautions: Fall Precaution Comments: watch for  othostatic hypotension Restrictions Weight Bearing Restrictions: No      Mobility  Bed Mobility Overal bed mobility: Modified Independent             General bed mobility comments: requires use of bedrail to obtain sitting position   Transfers Overall transfer level: Needs assistance Equipment used: Rolling walker (2 wheeled) Transfers: Sit to/from Stand Sit to Stand: Min guard         General transfer comment: uses bilat UE for liftoff, complained of dizziness   Ambulation/Gait Ambulation/Gait assistance: Min guard;+2 safety/equipment Ambulation Distance (Feet): 200 Feet Assistive device: Rolling walker (2 wheeled) Gait Pattern/deviations: Step-through pattern;Drifts right/left;Trunk flexed Gait velocity: 16ft/sec Gait velocity interpretation: <1.8 ft/sec, indicative of risk for recurrent falls General Gait Details: intermitten cuing to correct drifting to right and left, flexed trunk improved w/ cues, required standing break due to SOB  Stairs            Wheelchair Mobility    Modified Rankin (Stroke Patients Only)       Balance Overall balance assessment: Needs assistance Sitting-balance support: Single extremity supported Sitting balance-Leahy Scale: Good Sitting balance - Comments: forward flexed posture in sitting, able to reach but requires single extremity support   Standing balance support: Bilateral upper extremity supported Standing balance-Leahy Scale: Fair Standing balance comment: increase stability with RW, has forward flexed posture in stance                             Pertinent Vitals/Pain Pain Assessment: No/denies pain  Home Living Family/patient expects to be discharged to:: Shelter/Homeless Living Arrangements: Alone               Additional Comments: often sleeps in his car are at local churches    Prior Function Level of Independence: Independent with assistive device(s)         Comments: uses SPC       Hand Dominance   Dominant Hand: Right    Extremity/Trunk Assessment   Upper Extremity Assessment Upper Extremity Assessment: Overall WFL for tasks assessed (grossly at least 4/5)    Lower Extremity Assessment Lower Extremity Assessment: Overall WFL for tasks assessed (grossly at least 4/5)       Communication   Communication: No difficulties  Cognition Arousal/Alertness: Awake/alert Behavior During Therapy: WFL for tasks assessed/performed Overall Cognitive Status: Within Functional Limits for tasks assessed                 General Comments: became more alert with activity     General Comments      Exercises Other Exercises Other Exercises: Sitting balance reaching; 1x5 to improve dynamic sitting posture; modified independence  Other Exercises: gait training - 200 ft w/ RW and min guard, to improve cardiovascular endurance    Assessment/Plan    PT Assessment Patient needs continued PT services  PT Problem List Decreased strength;Decreased activity tolerance;Decreased balance;Decreased mobility;Cardiopulmonary status limiting activity;Decreased knowledge of use of DME;Decreased range of motion          PT Treatment Interventions DME instruction;Gait training;Functional mobility training;Stair training;Therapeutic exercise;Therapeutic activities;Balance training;Patient/family education    PT Goals (Current goals can be found in the Care Plan section)  Acute Rehab PT Goals Patient Stated Goal: Return home PT Goal Formulation: With patient Time For Goal Achievement: 05/17/16 Potential to Achieve Goals: Fair    Frequency Min 2X/week   Barriers to discharge Inaccessible home environment;Decreased caregiver support pt is homeless w/ no family support    Co-evaluation               End of Session Equipment Utilized During Treatment: Gait belt Activity Tolerance: Patient limited by fatigue Patient left: in bed;with call bell/phone within  reach;with bed alarm set           Time: [id number] PT Time Calculation (min) (ACUTE ONLY): 35 min   Charges:         PT G Codes:        Bruce [name] 05/03/2016, 4:10 PM

## 2016-05-03 NOTE — Consult Note (Signed)
Patient not available for consultation today. Upon chart review, patient has a long history of CML and is currently under treatment and evaluation at [facility]. It is unclear of his compliance to follow-up appointments or medications. Would hold treatment at this time until patient's cardiac issues are resolved. Please insure patient has follow-up scheduled with his primary oncologist at [facility] upon discharge.  Appreciate consult, full consult to follow.

## 2016-05-04 DIAGNOSIS — E7800 Pure hypercholesterolemia, unspecified: Secondary | ICD-10-CM

## 2016-05-04 DIAGNOSIS — Z87820 Personal history of traumatic brain injury: Secondary | ICD-10-CM

## 2016-05-04 DIAGNOSIS — Z79899 Other long term (current) drug therapy: Secondary | ICD-10-CM

## 2016-05-04 DIAGNOSIS — I5043 Acute on chronic combined systolic (congestive) and diastolic (congestive) heart failure: Secondary | ICD-10-CM

## 2016-05-04 DIAGNOSIS — R0602 Shortness of breath: Secondary | ICD-10-CM | POA: Diagnosis not present

## 2016-05-04 DIAGNOSIS — M6281 Muscle weakness (generalized): Secondary | ICD-10-CM | POA: Diagnosis not present

## 2016-05-04 DIAGNOSIS — N182 Chronic kidney disease, stage 2 (mild): Secondary | ICD-10-CM

## 2016-05-04 DIAGNOSIS — D473 Essential (hemorrhagic) thrombocythemia: Secondary | ICD-10-CM

## 2016-05-04 DIAGNOSIS — I10 Essential (primary) hypertension: Secondary | ICD-10-CM | POA: Diagnosis not present

## 2016-05-04 DIAGNOSIS — D72829 Elevated white blood cell count, unspecified: Secondary | ICD-10-CM | POA: Diagnosis not present

## 2016-05-04 DIAGNOSIS — I2510 Atherosclerotic heart disease of native coronary artery without angina pectoris: Secondary | ICD-10-CM

## 2016-05-04 DIAGNOSIS — Z856 Personal history of leukemia: Secondary | ICD-10-CM | POA: Diagnosis not present

## 2016-05-04 DIAGNOSIS — Z87891 Personal history of nicotine dependence: Secondary | ICD-10-CM

## 2016-05-04 DIAGNOSIS — R079 Chest pain, unspecified: Secondary | ICD-10-CM | POA: Diagnosis not present

## 2016-05-04 DIAGNOSIS — D75839 Thrombocytosis, unspecified: Secondary | ICD-10-CM

## 2016-05-04 LAB — BASIC METABOLIC PANEL
Anion gap: 9 (ref 5–15)
BUN: 18 mg/dL (ref 6–20)
CO2: 26 mmol/L (ref 22–32)
CREATININE: 1.22 mg/dL (ref 0.61–1.24)
Calcium: 8.4 mg/dL — ABNORMAL LOW (ref 8.9–10.3)
Chloride: 106 mmol/L (ref 101–111)
GFR calc Af Amer: >60 mL/min (ref >60)
GFR, EST NON AFRICAN AMERICAN: 58 mL/min — AB (ref >60)
GLUCOSE: 153 mg/dL — AB (ref 65–99)
Potassium: 3.7 mmol/L (ref 3.5–5.1)
SODIUM: 141 mmol/L (ref 135–145)

## 2016-05-04 MED ORDER — CARVEDILOL 6.25 MG PO TABS
6.2500 mg | ORAL_TABLET | Freq: Two times a day (BID) | ORAL | Status: DC
Start: 2016-05-04 — End: 2016-05-07
  Administered 2016-05-04 – 2016-05-07 (×7): 6.25 mg via ORAL
  Filled 2016-05-04 (×7): qty 1

## 2016-05-04 NOTE — Progress Notes (Signed)
Patient Name: Bruce [name] Date of Encounter: 05/04/2016  Primary Cardiologist: [facility] Problem List     Principal Problem:   Acute on chronic combined systolic and diastolic CHF, NYHA class 3 (HCC) Active Problems:   CML (chronic myelocytic leukemia) (HCC)   Coronary artery disease, non-occlusive   Homeless   Elevated troponin   Compliance poor   CKD (chronic kidney disease), stage II   Chest pain   Near syncope     Subjective   Breathing better. UOP 3 L for past 24 hours. Renal function stable. Still with atypical chest pain.   Inpatient Medications    Scheduled Meds: . aspirin  81 mg Oral Daily  . atorvastatin  40 mg Oral Daily  . carvedilol  3.125 mg Oral BID  . clopidogrel  75 mg Oral Daily  . docusate sodium  100 mg Oral BID  . enoxaparin (LOVENOX) injection  40 mg Subcutaneous Q24H  . furosemide  40 mg Intravenous BID  . pantoprazole  40 mg Oral Daily  . sacubitril-valsartan  1 tablet Oral BID  . sodium chloride flush  3 mL Intravenous Q12H  . spironolactone  25 mg Oral Daily   Continuous Infusions:  PRN Meds: acetaminophen **OR** acetaminophen, albuterol, ALPRAZolam, alum & mag hydroxide-simeth, bisacodyl, morphine injection, nitroGLYCERIN, ondansetron **OR** ondansetron (ZOFRAN) IV, oxyCODONE-acetaminophen   Vital Signs    Vitals:   05/04/16 0508 05/04/16 0510 05/04/16 0513 05/04/16 0758  BP: 105/62 94/64 102/69 114/73  Pulse: 70 75 79 73  Resp: 18   16  Temp: 97.7 F (36.5 C)   97.5 F (36.4 C)  TempSrc: Oral   Oral  SpO2: 98%   100%  Weight:      Height:        Intake/Output Summary (Last 24 hours) at 05/04/16 0839 Last data filed at 05/03/16 2200  Gross per 24 hour  Intake              120 ml  Output             3040 ml  Net            -2920 ml   Filed Weights   05/02/16 1053 05/03/16 0340 05/04/16 0500  Weight: 176 lb (79.8 kg) 175 lb 8 oz (79.6 kg) 172 lb 1.6 oz (78.1 kg)    Physical Exam    GEN: Well  nourished, well developed, in no acute distress.  HEENT: Grossly normal.  Neck: Supple, JVD remains significantly elevated, carotid bruits, or masses. Cardiac: RRR, no murmurs, rubs, or gallops. No clubbing, cyanosis. 1+ LE edema.  Radials/DP/PT 2+ and equal bilaterally. Chest pain fully reproducible to palpation.  Respiratory:  Breath sounds decreased bilaterally with crackles. GI: Soft, nontender, nondistended, BS + x 4. MS: no deformity or atrophy. Skin: warm and dry, no rash. Neuro:  Strength and sensation are intact. Psych: AAOx3.  Normal affect.  Labs    CBC  Recent Labs  05/02/16 1418 05/03/16 0701  WBC 69.4* 58.0*  NEUTROABS 59.7*  --   HGB 10.2* 9.3*  HCT 32.0* 28.4*  MCV 97.9 96.6  PLT 1,066* [id number]*   Basic Metabolic Panel  Recent Labs  05/03/16 0701 05/04/16 0423  NA 138 141  K 3.9 3.7  CL 107 106  CO2 23 26  GLUCOSE 125* 153*  BUN 18 18  CREATININE 0.93 1.22  CALCIUM 8.5* 8.4*   Liver Function Tests  Recent Labs  05/03/16 0701  AST  37  ALT 21  ALKPHOS 235*  BILITOT 1.8*  PROT 7.1  ALBUMIN 3.4*   No results for input(s): LIPASE, AMYLASE in the last 72 hours. Cardiac Enzymes  Recent Labs  05/02/16 1928 05/03/16 0103 05/03/16 0701  TROPONINI 0.16* 0.14* 0.14*   BNP Invalid input(s): POCBNP D-Dimer No results for input(s): DDIMER in the last 72 hours. Hemoglobin A1C No results for input(s): HGBA1C in the last 72 hours. Fasting Lipid Panel No results for input(s): CHOL, HDL, LDLCALC, TRIG, CHOLHDL, LDLDIRECT in the last 72 hours. Thyroid Function Tests No results for input(s): TSH, T4TOTAL, T3FREE, THYROIDAB in the last 72 hours.  Invalid input(s): FREET3  Telemetry    Sinus rhythm overnight in the 70's, currently in atrial tach vs svt - Personally Reviewed  ECG    n/a - Personally Reviewed  Radiology    Dg Chest 2 View  Result Date: 05/02/2016 CLINICAL DATA:  Chest pain, shortness breath, and weakness for 1 week. EXAM:  CHEST  2 VIEW COMPARISON:  04/29/2016 FINDINGS: Stable mild cardiomegaly. Mediastinal contours are within normal limits. Both lungs are clear. No evidence of pneumothorax or pleural effusion. Mild thoracic spine degenerative changes again noted. IMPRESSION: Stable mild cardiomegaly.  No active cardiopulmonary disease. Electronically Signed   By: Earle Gell M.D.   On: 05/02/2016 12:25   Ct Head Wo Contrast  Result Date: 05/02/2016 CLINICAL DATA:  Weakness.  History of leukemia. EXAM: CT HEAD WITHOUT CONTRAST TECHNIQUE: Contiguous axial images were obtained from the base of the skull through the vertex without intravenous contrast. COMPARISON:  03/22/2016 multiple studies as distant as 09/11/2010. FINDINGS: Brain: Generalized brain atrophy. No evidence of old or acute focal infarction, mass lesion, hemorrhage, hydrocephalus or extra-axial collection. Vascular: There is atherosclerotic calcification of the major vessels at the base of the brain. Skull: No skull fracture Sinuses/Orbits: Old bilateral orbital blowout fractures affecting the lamina papyracea up. No active sinus disease. Other: Soft tissue scalp swelling in the right parietal region without underlying skull abnormality. This is a chronic finding. IMPRESSION: No acute intracranial finding.  Age related atrophy. Old orbital medial wall blowout fractures. Right parietal scalp swelling which is chronic. No underlying skull abnormality. Electronically Signed   By: Nelson Chimes M.D.   On: 05/02/2016 13:58    Cardiac Studies   Echo 03/2016: Study Conclusions  - Left ventricle: The cavity size was mildly dilated. Wall   thickness was normal. Systolic function was severely reduced. The   estimated ejection fraction was in the range of 15% to 20%.   Diffuse hypokinesis. The study is not technically sufficient to   allow evaluation of LV diastolic function. - Mitral valve: There was mild regurgitation. - Left atrium: The atrium was mildly to  moderately dilated. - Right ventricle: The cavity size was mildly dilated. Wall   thickness was normal. Systolic function was moderately reduced. - Right atrium: The atrium was moderately dilated. - Tricuspid valve: Mildly thickened leaflets. There was   moderate-severe regurgitation. - Pulmonic valve: There was moderate regurgitation. - Pulmonary arteries: Systolic pressure was moderately increased.   PA peak pressure: 50 mm Hg (S).  Patient Profile     71 y.o. male with history of chronic combined CHF class III, NICM with cardiac cath in 2008 showing nonobstructive CAD, previously homeless in November and December 2017 now living in a church, CKD stage II-III, medication noncompliance, CVA x 2 without Afib work up, atrial tachycardia on amiodarone, HTN, ETOH abuse, and CML not currently under  treatment who was recently admitted in mid December 2017 for volume overload returned to [facility] with increased SOB and nonexertional chest pain.   Assessment & Plan    1. Acute on chronic combined CHF, NYHA class III: -Diuresing well, still significantly volume overloaded -Continue IV Lasix with KCl repletion -Monitor UOP and daily weights -His compliance at home is uncertain -Continue Coreg, Entresto, spironolactone  -Consider discontinuation of digoxin given compliance issues -No need to repeat echo given recent study from 03/2016  2. Elevated troponin/atypical chest pain: -Chest pain reproducible to palpation on exam -Unlikely ACS -Can schedule Lexiscan Myoview for 1/24 as breathing improves -Continue ASA and Plavix -As below  3. NICM: -By cardiac cath in 2008 -Consider nuclear stress test prior to discharge once breathing is at baseline  4. Near syncope: -Orthostatic vital signs negative s/p IV fluids -Await results  5. CML: -Oncology has been consulted   6. Noncompliance with medical regimen presenting significant health hazards: -Compliance advised   7. CKD stage  II: -Stable  8. Atrial arrhythmia: -heart rate 126 bpm currently and sustaining -Increase Coreg to 6.25 mg bid -Continue amiodarone (long standing medication for this) per primary cardiology notes    Signed, Marcille Blanco [place] Pager: [phone] 05/04/2016, 8:39 AM

## 2016-05-04 NOTE — Progress Notes (Signed)
Physical Therapy Treatment Patient Details Name: Bruce [name] MRN: [id number] DOB: 1946-02-02 Today's Date: 05/04/2016    History of Present Illness Pt is a 71 yo male w/ significant history of CML, CAD, HTN, CKD, who came into the hospital w/ complaints of; weaknes, dizziness, and chest pain.     PT Comments    Pt agreeable to PT; reports only mild chest pain (2/10). Pt continues to note mild dizziness as well in all positions that does not change with positional changes. Pt requires Min guard to close supervision with mobility and demonstrates decreased safety awareness about room with rolling walker abandonment and furniture walking with HHA since needed. Pt progresses ambulation with rolling walker, but continues to display some need for directional cues and cues to stay focused on task at hand for safety. Speed decreased subjectively from baseline.  BP WNL this session as well as HR and O2 saturation on room air. 74 beats per minutes, 99% and 112/70 respectively. Continue PT to progress strength and quality/safety with ambulation for improved functional mobility and fall prevention.   Follow Up Recommendations  SNF     Equipment Recommendations  Rolling walker with 5" wheels    Recommendations for Other Services       Precautions / Restrictions Precautions Precautions: Fall Restrictions Weight Bearing Restrictions: No    Mobility  Bed Mobility Overal bed mobility: Modified Independent             General bed mobility comments: use of bedrails, mild increased time  Transfers Overall transfer level: Needs assistance Equipment used: Rolling walker (2 wheeled) Transfers: Sit to/from Stand Sit to Stand: Min guard         General transfer comment: min guard to close supervision due to pt complaints of mild dizziness  Ambulation/Gait Ambulation/Gait assistance: Min guard Ambulation Distance (Feet): 250 Feet (ambulates HHA 20 ft in room with furniture  reach) Assistive device: Rolling walker (2 wheeled) Gait Pattern/deviations: Step-through pattern;Narrow base of support (B knees flexed at all times) Gait velocity: reduced Gait velocity interpretation: <1.8 ft/sec, indicative of risk for recurrent falls General Gait Details: directional cues; mildly unsteady/distracted without overt LOB. Does not extend knees with heel strike. Becomes increasingly fatigued the last 30 ft of walk with poor approach to bed and rw abandonment   Stairs            Wheelchair Mobility    Modified Rankin (Stroke Patients Only)       Balance Overall balance assessment: Needs assistance Sitting-balance support: Feet supported;Bilateral upper extremity supported;Single extremity supported Sitting balance-Leahy Scale: Good     Standing balance support: Bilateral upper extremity supported Standing balance-Leahy Scale: Fair                      Cognition Arousal/Alertness: Awake/alert Behavior During Therapy: WFL for tasks assessed/performed Overall Cognitive Status: Within Functional Limits for tasks assessed                      Exercises      General Comments        Pertinent Vitals/Pain Pain Assessment: Faces Faces Pain Scale: Hurts a little bit Pain Location: In chest Pain Intervention(s): Monitored during session    Home Living                      Prior Function            PT Goals (current goals can now  be found in the care plan section)      Frequency    Min 2X/week      PT Plan Current plan remains appropriate    Co-evaluation             End of Session Equipment Utilized During Treatment: Gait belt Activity Tolerance: Patient limited by fatigue (mild dizziness)       Time: [id number] PT Time Calculation (min) (ACUTE ONLY): 27 min  Charges:  $Gait Training: 23-37 mins                    G CodesLarae Grooms, PTA 05/04/2016, 3:28 PM

## 2016-05-04 NOTE — Consult Note (Signed)
[place]  Telephone:([phone]) [phone] Fax:(336) [phone]  ID: Bruce [name] OB: [birth year]  MR#: [id number]  [id number]  Patient Care Team: [name], MD as PCP - General (Cardiology) Truitt Merle, MD as Consulting Physician (Hematology)  CHIEF COMPLAINT: History of CML, CHF exacerbation.  INTERVAL HISTORY: Patient is a 71 year old male who was recently admitted the hospital with worsening shortness of breath. He is found to have CHF exacerbation is receiving IV Lasix with improvement of his symptoms. He has slurred speech and left arm weakness, but reports this is unchanged and chronic. He could not tolerate MRI secondary to claustrophobia. His CML was being managed at [facility], but he has not been evaluated in nearly one year. Previously he was known to be noncompliant with missing multiple appointments. He has no new neurologic complaints. He denies any recent fevers. He denies any chest pain, cough, or hemoptysis. He has a good appetite and denies weight loss. He denies any night sweats. He has no nausea, vomiting, constipation, or diarrhea. Patient offers no further specific complaints today.  REVI EW OF SYSTEMS:   Review of Systems  Constitutional: Negative.  Negative for chills, diaphoresis, fever, malaise/fatigue and weight loss.  Respiratory: Positive for shortness of breath. Negative for cough and hemoptysis.   Cardiovascular: Negative.  Negative for chest pain and leg swelling.  Gastrointestinal: Negative.  Negative for abdominal pain.  Genitourinary: Negative.   Musculoskeletal: Negative.   Neurological: Negative.  Negative for weakness.  Psychiatric/Behavioral: Positive for memory loss.    As per HPI. Otherwise, a complete review of systems is negative.  PAST MEDICAL HISTORY: Past Medical History:  Diagnosis Date  . Bell's palsy   . Chronic combined systolic and diastolic CHF, NYHA class 3 ([place])   . CKD (chronic kidney disease), stage II   . CML  (chronic myelocytic leukemia) ([place])   . Coronary artery disease, non-occlusive   . Hypercholesterolemia   . Hypertension   . Leukemia ([place])   . Stroke ([place])    No residual limb weakness.  Walks with cane at baseline.   Marland Kitchen TIA (transient ischemic attack) 05/10/2014    PAST SURGICAL HISTORY: Past Surgical History:  Procedure Laterality Date  . BACK SURGERY    . HIP ARTHROPLASTY Right    orif  . ORIF FOREARM FRACTURE Right     FAMILY HISTORY: Family History  Problem Relation Age of Onset  . Diabetes Mother   . Hypertension Mother   . Diabetes Father   . Hypertension Father   . Diabetes Brother   . Hypertension Brother   . Diabetes Sister   . Hypertension Sister   . Diabetes Brother   . Hypertension Brother   . Diabetes Sister   . Hypertension Sister     ADVANCED DIRECTIVES (Y/N):  @ADVDIR @  HEALTH MAINTENANCE: Social History  Substance Use Topics  . Smoking status: Former Smoker    Packs/day: 0.50    Years: 50.00    Types: Cigarettes  . Smokeless tobacco: Never Used  . Alcohol use 0.6 oz/week    1 Cans of beer per week     Comment: daily      Colonoscopy:  PAP:  Bone density:  Lipid panel:  No Known Allergies  Current Facility-Administered Medications  Medication Dose Route Frequency Provider Last Rate Last Dose  . acetaminophen (TYLENOL) tablet 650 mg  650 mg Oral Q6H PRN Idelle Crouch, MD       Or  . acetaminophen (TYLENOL) suppository 650 mg  650 mg Rectal Q6H PRN Idelle Crouch, MD      . albuterol (PROVENTIL) (2.5 MG/3ML) 0.083% nebulizer solution 3 mL  3 mL Inhalation Q4H PRN Idelle Crouch, MD      . ALPRAZolam Duanne Moron) tablet 0.25 mg  0.25 mg Oral BID PRN Idelle Crouch, MD   0.25 mg at 05/03/16 2127  . alum & mag hydroxide-simeth (MAALOX/MYLANTA) 200-200-20 MG/5ML suspension 30 mL  30 mL Oral Q4H PRN Idelle Crouch, MD      . aspirin chewable tablet 81 mg  81 mg Oral Daily Idelle Crouch, MD   81 mg at 05/04/16 0851  . atorvastatin  (LIPITOR) tablet 40 mg  40 mg Oral Daily Idelle Crouch, MD   40 mg at 05/04/16 0850  . bisacodyl (DULCOLAX) suppository 10 mg  10 mg Rectal Daily PRN Idelle Crouch, MD      . carvedilol (COREG) tablet 6.25 mg  6.25 mg Oral BID Areta Haber Dunn, PA-C   6.25 mg at 05/04/16 0856  . clopidogrel (PLAVIX) tablet 75 mg  75 mg Oral Daily Idelle Crouch, MD   75 mg at 05/04/16 [id number]  . docusate sodium (COLACE) capsule 100 mg  100 mg Oral BID Idelle Crouch, MD   100 mg at 05/04/16 0851  . enoxaparin (LOVENOX) injection 40 mg  40 mg Subcutaneous Q24H Idelle Crouch, MD   40 mg at 05/03/16 2127  . furosemide (LASIX) injection 40 mg  40 mg Intravenous BID Areta Haber Dunn, PA-C   40 mg at 05/04/16 0850  . morphine 2 MG/ML injection 2 mg  2 mg Intravenous Q2H PRN Idelle Crouch, MD      . nitroGLYCERIN (NITROSTAT) SL tablet 0.4 mg  0.4 mg Sublingual Q5 min PRN Idelle Crouch, MD      . ondansetron [facility]) tablet 4 mg  4 mg Oral Q6H PRN Idelle Crouch, MD       Or  . ondansetron [facility]) injection 4 mg  4 mg Intravenous Q6H PRN Idelle Crouch, MD      . oxyCODONE-acetaminophen (PERCOCET/ROXICET) 5-325 MG per tablet 1 tablet  1 tablet Oral Q6H PRN Alexis Hugelmeyer, DO   1 tablet at 05/03/16 2127  . pantoprazole (PROTONIX) EC tablet 40 mg  40 mg Oral Daily Idelle Crouch, MD   40 mg at 05/04/16 [id number]  . sacubitril-valsartan (ENTRESTO) 24-26 mg per tablet  1 tablet Oral BID Idelle Crouch, MD   1 tablet at 05/04/16 [id number]  . sodium chloride flush (NS) 0.9 % injection 3 mL  3 mL Intravenous Q12H Idelle Crouch, MD   3 mL at 05/04/16 0856  . spironolactone (ALDACTONE) tablet 25 mg  25 mg Oral Daily Idelle Crouch, MD   25 mg at 05/04/16 0852    OBJECTIVE: Vitals:   05/04/16 0513 05/04/16 0758  BP: 102/69 114/73  Pulse: 79 73  Resp:  16  Temp:  97.5 F (36.4 C)     Body mass index is 26.17 kg/m.    ECOG FS:1 - Symptomatic but completely ambulatory  General: Well-developed, well-nourished,  no acute distress. Eyes: Pink conjunctiva, anicteric sclera. HEENT: Normocephalic, moist mucous membranes, clear oropharnyx. Lungs: Clear to auscultation bilaterally. Heart: Regular rate and rhythm. No rubs, murmurs, or gallops. Abdomen: Soft, nontender, nondistended. No organomegaly noted, normoactive bowel sounds. Musculoskeletal: No edema, cyanosis, or clubbing. Neuro: Alert, answering all questions appropriately. Cranial nerves grossly intact. Skin: No rashes  or petechiae noted. Psych: Normal affect. Lymphatics: No cervical, calvicular, axillary or inguinal LAD.   LAB RESULTS:  Lab Results  Component Value Date   NA 141 05/04/2016   K 3.7 05/04/2016   CL 106 05/04/2016   CO2 26 05/04/2016   GLUCOSE 153 (H) 05/04/2016   BUN 18 05/04/2016   CREATININE 1.22 05/04/2016   CALCIUM 8.4 (L) 05/04/2016   PROT 7.1 05/03/2016   ALBUMIN 3.4 (L) 05/03/2016   AST 37 05/03/2016   ALT 21 05/03/2016   ALKPHOS 235 (H) 05/03/2016   BILITOT 1.8 (H) 05/03/2016   GFRNONAA 58 (L) 05/04/2016   GFRAA >60 05/04/2016    Lab Results  Component Value Date   WBC 58.0 (HH) 05/03/2016   NEUTROABS 59.7 (H) 05/02/2016   HGB 9.3 (L) 05/03/2016   HCT 28.4 (L) 05/03/2016   MCV 96.6 05/03/2016   PLT 941 ([place]) 05/03/2016     STUDIES: Dg Chest 2 View  Result Date: 05/02/2016 CLINICAL DATA:  Chest pain, shortness breath, and weakness for 1 week. EXAM: CHEST  2 VIEW COMPARISON:  04/29/2016 FINDINGS: Stable mild cardiomegaly. Mediastinal contours are within normal limits. Both lungs are clear. No evidence of pneumothorax or pleural effusion. Mild thoracic spine degenerative changes again noted. IMPRESSION: Stable mild cardiomegaly.  No active cardiopulmonary disease. Electronically Signed   By: Earle Gell M.D.   On: 05/02/2016 12:25   Dg Chest 2 View  Result Date: 04/29/2016 CLINICAL DATA:  Cough. EXAM: CHEST  2 VIEW COMPARISON:  04/04/2016 . FINDINGS: Mediastinum hilar structures normal. Cardiomegaly  with mild pulmonary vascular prominence. No focal infiltrate. No pleural effusion or pneumothorax. IMPRESSION: Cardiomegaly with mild pulmonary vascular prominence. No focal infiltrate. Electronically Signed   By: Marcello Moores  Register   On: 04/29/2016 12:32   Ct Head Wo Contrast  Result Date: 05/02/2016 CLINICAL DATA:  Weakness.  History of leukemia. EXAM: CT HEAD WITHOUT CONTRAST TECHNIQUE: Contiguous axial images were obtained from the base of the skull through the vertex without intravenous contrast. COMPARISON:  03/22/2016 multiple studies as distant as 09/11/2010. FINDINGS: Brain: Generalized brain atrophy. No evidence of old or acute focal infarction, mass lesion, hemorrhage, hydrocephalus or extra-axial collection. Vascular: There is atherosclerotic calcification of the major vessels at the base of the brain. Skull: No skull fracture Sinuses/Orbits: Old bilateral orbital blowout fractures affecting the lamina papyracea up. No active sinus disease. Other: Soft tissue scalp swelling in the right parietal region without underlying skull abnormality. This is a chronic finding. IMPRESSION: No acute intracranial finding.  Age related atrophy. Old orbital medial wall blowout fractures. Right parietal scalp swelling which is chronic. No underlying skull abnormality. Electronically Signed   By: Nelson Chimes M.D.   On: 05/02/2016 13:58    ASSESSMENT: History of CML, CHF exacerbation.   PLAN:     1. CML: Patient previously was treated at [facility], but has not been evaluated or under treatment for nearly one year. Previously he was taking Bosulif 300 mg daily, but is unclear if his compliance. Given his significantly elevated white blood cell count and platelet count, he clearly has not taken treatment in some time. Patient states he wishes to transfer his care to [facility]. Please insure patient has follow-up in the [place] 1-2 weeks after discharge for further evaluation and consideration of reinitiating  treatment. 2. CHF: Appreciate cardiology input. Continue diuresis as indicated.  Appreciate consult, call with questions.  Lloyd Huger, MD   05/04/2016 2:39 PM

## 2016-05-04 NOTE — Progress Notes (Signed)
Patient ID: Bruce [name], male   DOB: Aug 12, 1945, 71 y.o.   MRN: [id number]   [facility] PROGRESS NOTE  Bruce [name] [id number] DOB: [birth year] DOA: 05/02/2016 PCP: [name], MD  HPI/Subjective: Patient has a cough. Still not breathing as well as in light. States his left knee gave out and he collapsed when he was an outpatient.  Objective: Vitals:   05/04/16 0758 05/04/16 1447  BP: 114/73 110/64  Pulse: 73 74  Resp: 16   Temp: 97.5 F (36.4 C)     Filed Weights   05/02/16 1053 05/03/16 0340 05/04/16 0500  Weight: 79.8 kg (176 lb) 79.6 kg (175 lb 8 oz) 78.1 kg (172 lb 1.6 oz)    ROS: Review of Systems  Constitutional: Negative for chills and fever.  Eyes: Negative for blurred vision.  Respiratory: Positive for cough and shortness of breath.   Cardiovascular: Negative for chest pain.  Gastrointestinal: Negative for abdominal pain, constipation, diarrhea, nausea and vomiting.  Genitourinary: Negative for dysuria.  Musculoskeletal: Negative for joint pain.  Neurological: Positive for speech change and focal weakness. Negative for dizziness and headaches.   Exam: Physical Exam    Data Reviewed: Basic Metabolic Panel:  Recent Labs Lab 05/02/16 1116 05/03/16 0701 05/04/16 0423  NA 139 138 141  K 3.8 3.9 3.7  CL 108 107 106  CO2 22 23 26   GLUCOSE 152* 125* 153*  BUN 18 18 18   CREATININE 1.04 0.93 1.22  CALCIUM 8.9 8.5* 8.4*   Liver Function Tests:  Recent Labs Lab 05/03/16 0701  AST 37  ALT 21  ALKPHOS 235*  BILITOT 1.8*  PROT 7.1  ALBUMIN 3.4*   CBC:  Recent Labs Lab 05/02/16 1116 05/02/16 1418 05/03/16 0701  WBC 67.3* 69.4* 58.0*  NEUTROABS  --  59.7*  --   HGB 9.4* 10.2* 9.3*  HCT 29.3* 32.0* 28.4*  MCV 97.4 97.9 96.6  PLT 977* 1,066* 941*   Cardiac Enzymes:  Recent Labs Lab 05/02/16 1116 05/02/16 1928 05/03/16 0103 05/03/16 0701  TROPONINI 0.15* 0.16* 0.14* 0.14*   BNP (last 3 results)  Recent  Labs  03/27/16 1629 04/04/16 1103 05/02/16 1115  BNP 2,790.9* 1,141.8* 2,219.0*     CBG:  Recent Labs Lab 05/03/16 0757 05/03/16 1152 05/03/16 1718  GLUCAP 123* 168* 130*      Scheduled Meds: . aspirin  81 mg Oral Daily  . atorvastatin  40 mg Oral Daily  . carvedilol  6.25 mg Oral BID  . clopidogrel  75 mg Oral Daily  . docusate sodium  100 mg Oral BID  . enoxaparin (LOVENOX) injection  40 mg Subcutaneous Q24H  . furosemide  40 mg Intravenous BID  . pantoprazole  40 mg Oral Daily  . sacubitril-valsartan  1 tablet Oral BID  . sodium chloride flush  3 mL Intravenous Q12H  . spironolactone  25 mg Oral Daily    Assessment/Plan:  1. Acute on chronic systolic congestive heart failure. Patient diuresed well so far. Compliance with medications could be the issues since he is homeless. He states he is now found a place to live. Continue IV Lasix, entresto and Coreg 2. Elevated troponin likely demand ischemia with heart failure. Case discussed with cardiology and they ordered a stress test for tomorrow 3. Slurred speech which is chronic. Continue aspirin and Plavix. MRI of the brain Could not be completed secondary to claustrophobia despite medications 4. Hyperlipidemia unspecified on atorvastatin 5. History of CML with leukocytosis and thrombocytosis. Dr.  Grayland [name] states he will follow up the patient as outpatient  Code Status:     Code Status Orders        Start     Ordered   05/02/16 1900  Full code  Continuous     05/02/16 1859    Code Status History    Date Active Date Inactive Code Status Order ID Comments User Context   03/21/2016  6:40 PM 03/23/2016  6:39 PM Full Code [id number]  Idelle Crouch, MD Inpatient   10/10/2015 11:59 PM 10/15/2015  7:08 PM Full Code [id number]  Ivor Costa, MD ED   06/04/2015  1:54 AM 06/06/2015  5:28 PM Full Code [id number]  [name], MD ED   01/25/2015  3:16 AM 01/29/2015  5:17 PM Full Code [id number]  Lavina Hamman, MD ED   09/13/2014   5:07 PM 09/22/2014  6:09 PM Full Code [id number]  Flonnie Overman Dhungel, MD Inpatient   03/20/2014 11:33 PM 03/21/2014  5:46 PM Full Code [id number]  Arne Cleveland, MD Inpatient   03/18/2014 11:46 PM 03/20/2014 11:33 PM Full Code [id number]  [name], MD Inpatient   03/05/2014  9:04 PM 03/06/2014  6:57 PM Full Code [id number]  [name], MD Inpatient      Disposition Plan: Potential discharge tomorrow Afternoon after stress test  Consultants:  Cardiology  Time spent: 26 minutes  [facility]

## 2016-05-04 NOTE — Plan of Care (Signed)
Problem: Health Behavior/Discharge Planning: Goal: Ability to manage health-related needs will improve Outcome: Not Progressing Patient noncompliant with MRI.

## 2016-05-04 NOTE — Care Management Obs Status (Signed)
[place] NOTIFICATION   Patient Details  Name: Bruce [name] MRN: [id number] Date of Birth: 05/10/1945   Medicare Observation Status Notification Given:  Yes    Beverly Sessions, RN 05/04/2016, 5:41 PM

## 2016-05-04 NOTE — Progress Notes (Signed)
   05/03/16 1609  PT Time Calculation  PT Start Time (ACUTE ONLY) 1512  PT Stop Time (ACUTE ONLY) 1547  PT Time Calculation (min) (ACUTE ONLY) 35 min  PT G-Codes **NOT FOR INPATIENT CLASS**  Functional Assessment Tool Used clinical judgement  Functional Limitation Mobility: Walking and moving around  Mobility: Walking and Moving Around Current Status [id number]) CJ  Mobility: Walking and Moving Around Goal Status [id number]) CI  PT General Charges  $$ ACUTE PT VISIT 1 Procedure  PT Evaluation  $PT Eval Moderate Complexity 1 Procedure   Late-entry eval and g-codes added after review of documentation.  Sonika Levins H. Owens Shark, PT, DPT, NCS 05/04/16, 7:57 AM [id number]

## 2016-05-05 ENCOUNTER — Other Ambulatory Visit: Payer: Medicare Other

## 2016-05-05 DIAGNOSIS — I5043 Acute on chronic combined systolic (congestive) and diastolic (congestive) heart failure: Secondary | ICD-10-CM | POA: Diagnosis not present

## 2016-05-05 DIAGNOSIS — D72829 Elevated white blood cell count, unspecified: Secondary | ICD-10-CM | POA: Diagnosis not present

## 2016-05-05 DIAGNOSIS — M6281 Muscle weakness (generalized): Secondary | ICD-10-CM | POA: Diagnosis not present

## 2016-05-05 DIAGNOSIS — I498 Other specified cardiac arrhythmias: Secondary | ICD-10-CM

## 2016-05-05 DIAGNOSIS — C9210 Chronic myeloid leukemia, BCR/ABL-positive, not having achieved remission: Secondary | ICD-10-CM

## 2016-05-05 DIAGNOSIS — R0789 Other chest pain: Secondary | ICD-10-CM

## 2016-05-05 DIAGNOSIS — I420 Dilated cardiomyopathy: Secondary | ICD-10-CM

## 2016-05-05 DIAGNOSIS — D473 Essential (hemorrhagic) thrombocythemia: Secondary | ICD-10-CM | POA: Diagnosis not present

## 2016-05-05 LAB — GLUCOSE, CAPILLARY
GLUCOSE-CAPILLARY: 259 mg/dL — AB (ref 65–99)
Glucose-Capillary: 132 mg/dL — ABNORMAL HIGH (ref 65–99)
Glucose-Capillary: 108 mg/dL — ABNORMAL HIGH (ref 65–99)

## 2016-05-05 MED ORDER — AMIODARONE HCL 200 MG PO TABS
200.0000 mg | ORAL_TABLET | Freq: Every day | ORAL | Status: DC
  Administered 2016-05-05 – 2016-05-07 (×3): 200 mg via ORAL
  Filled 2016-05-05 (×3): qty 1

## 2016-05-05 MED ORDER — FUROSEMIDE 40 MG PO TABS
80.0000 mg | ORAL_TABLET | Freq: Two times a day (BID) | ORAL | Status: DC
  Administered 2016-05-05 – 2016-05-07 (×5): 80 mg via ORAL
  Filled 2016-05-05 (×5): qty 2

## 2016-05-05 MED ORDER — SPIRONOLACTONE 25 MG PO TABS: 25.0000 mg | ORAL_TABLET | Freq: Every day | ORAL | 6 refills | Status: DC

## 2016-05-05 MED ORDER — CARVEDILOL 6.25 MG PO TABS: 6.2500 mg | ORAL_TABLET | Freq: Two times a day (BID) | ORAL | 6 refills | Status: DC

## 2016-05-05 NOTE — NC FL2 (Signed)
[place] LEVEL OF CARE SCREENING TOOL     IDENTIFICATION  Patient Name: Bruce [name] Birthdate: [birth year] Sex: male Admission Date (Current Location): 05/02/2016  [place] and [place] Number:  Engineering geologist and Address:  [facility], [address], [place], [place] [address]      Provider Number: [id number]  Attending Physician Name and Address:  Theodoro Grist, MD  Relative Name and Phone Number:  [name],[name] Relative   [phone]     Current Level of Care: Hospital Recommended Level of Care: [place] Prior Approval Number:    Date Approved/Denied:   PASRR Number: [id number]  Discharge Plan: SNF    Current Diagnoses: Patient Active Problem List   Diagnosis Date Noted  . Acute on chronic combined systolic and diastolic CHF (congestive heart failure) ([place]) 05/05/2016  . Atypical chest pain 05/05/2016  . Cardiomyopathy 05/05/2016  . Atrial arrhythmia 05/05/2016  . Thrombocytosis ([place])   . Chest pain 05/02/2016  . Near syncope 05/02/2016  . Acute respiratory distress 03/22/2016  . Homeless 03/22/2016  . Slurred speech 03/22/2016  . History of stroke 03/22/2016  . Elevated lactic acid level 10/11/2015  . CKD (chronic kidney disease), stage II   . Dizziness 01/25/2015  . Acute on chronic combined systolic and diastolic CHF, NYHA class 3 ([place]) 01/25/2015  . Essential hypertension 01/25/2015  . Compliance poor 01/25/2015  . Coronary artery disease, non-occlusive   . H/O: stroke with residual effects 09/13/2014  . Transaminitis 09/13/2014  . Elevated troponin 09/13/2014  . TIA (transient ischemic attack) 05/10/2014  . CML (chronic myelocytic leukemia) ([place]) 03/27/2014  . Leukocytosis 03/18/2014    Orientation RESPIRATION BLADDER Height & Weight     Self, Time, Situation, Place  Normal Continent Weight: 165 lb 4.8 oz (75 kg) Height:  5\' 8"  (172.7 cm)  BEHAVIORAL SYMPTOMS/MOOD NEUROLOGICAL  BOWEL NUTRITION STATUS      Continent Diet (Cardiac )  AMBULATORY STATUS COMMUNICATION OF NEEDS Skin   Limited Assist Verbally Normal                       Personal Care Assistance Level of Assistance  Bathing, Feeding, Dressing Bathing Assistance: Limited assistance Feeding assistance: Independent Dressing Assistance: Limited assistance     Functional Limitations Info  Sight, Hearing, Speech Sight Info: Adequate Hearing Info: Adequate Speech Info: Adequate    SPECIAL CARE FACTORS FREQUENCY  PT (By licensed PT)     PT Frequency: 5x a week              Contractures Contractures Info: Not present    Additional Factors Info  Code Status, Allergies Code Status Info: Full Code Allergies Info: NKA           Current Medications (05/05/2016):  This is the current hospital active medication list Current Facility-Administered Medications  Medication Dose Route Frequency Provider Last Rate Last Dose  . acetaminophen (TYLENOL) tablet 650 mg  650 mg Oral Q6H PRN Idelle Crouch, MD       Or  . acetaminophen (TYLENOL) suppository 650 mg  650 mg Rectal Q6H PRN Idelle Crouch, MD      . albuterol (PROVENTIL) (2.5 MG/3ML) 0.083% nebulizer solution 3 mL  3 mL Inhalation Q4H PRN Idelle Crouch, MD      . ALPRAZolam Duanne Moron) tablet 0.25 mg  0.25 mg Oral BID PRN Idelle Crouch, MD   0.25 mg at 05/04/16 2101  . alum &  mag hydroxide-simeth (MAALOX/MYLANTA) 200-200-20 MG/5ML suspension 30 mL  30 mL Oral Q4H PRN Idelle Crouch, MD      . amiodarone (PACERONE) tablet 200 mg  200 mg Oral Daily Ryan M Dunn, PA-C      . aspirin chewable tablet 81 mg  81 mg Oral Daily Idelle Crouch, MD   81 mg at 05/05/16 [id number]  . atorvastatin (LIPITOR) tablet 40 mg  40 mg Oral Daily Idelle Crouch, MD   40 mg at 05/05/16 0841  . bisacodyl (DULCOLAX) suppository 10 mg  10 mg Rectal Daily PRN Idelle Crouch, MD      . carvedilol (COREG) tablet 6.25 mg  6.25 mg Oral BID Areta Haber Dunn, PA-C    6.25 mg at 05/05/16 [id number]  . clopidogrel (PLAVIX) tablet 75 mg  75 mg Oral Daily Idelle Crouch, MD   75 mg at 05/05/16 0842  . docusate sodium (COLACE) capsule 100 mg  100 mg Oral BID Idelle Crouch, MD   100 mg at 05/05/16 0841  . enoxaparin (LOVENOX) injection 40 mg  40 mg Subcutaneous Q24H Idelle Crouch, MD   40 mg at 05/04/16 2102  . furosemide (LASIX) tablet 80 mg  80 mg Oral BID Areta Haber Dunn, PA-C      . morphine 2 MG/ML injection 2 mg  2 mg Intravenous Q2H PRN Idelle Crouch, MD      . nitroGLYCERIN (NITROSTAT) SL tablet 0.4 mg  0.4 mg Sublingual Q5 min PRN Idelle Crouch, MD      . ondansetron [facility]) tablet 4 mg  4 mg Oral Q6H PRN Idelle Crouch, MD       Or  . ondansetron [facility]) injection 4 mg  4 mg Intravenous Q6H PRN Idelle Crouch, MD      . oxyCODONE-acetaminophen (PERCOCET/ROXICET) 5-325 MG per tablet 1 tablet  1 tablet Oral Q6H PRN Alexis Hugelmeyer, DO   1 tablet at 05/05/16 0842  . pantoprazole (PROTONIX) EC tablet 40 mg  40 mg Oral Daily Idelle Crouch, MD   40 mg at 05/05/16 0841  . sacubitril-valsartan (ENTRESTO) 24-26 mg per tablet  1 tablet Oral BID Idelle Crouch, MD   1 tablet at 05/05/16 [phone]  . sodium chloride flush (NS) 0.9 % injection 3 mL  3 mL Intravenous Q12H Idelle Crouch, MD   3 mL at 05/04/16 2102  . spironolactone (ALDACTONE) tablet 25 mg  25 mg Oral Daily Idelle Crouch, MD   25 mg at 05/05/16 [id number]     Discharge Medications: Please see discharge summary for a list of discharge medications.  Relevant Imaging Results:  Relevant Lab Results:   Additional Information SSN   Katrice Goel, [place], LCSWA

## 2016-05-05 NOTE — Progress Notes (Signed)
Patient Name: Bruce [name] Date of Encounter: 05/05/2016  Primary Cardiologist: [facility] Problem List     Principal Problem:   Acute on chronic combined systolic and diastolic CHF, NYHA class 3 (HCC) Active Problems:   CML (chronic myelocytic leukemia) (HCC)   Coronary artery disease, non-occlusive   Homeless   Elevated troponin   Compliance poor   CKD (chronic kidney disease), stage II   Chest pain   Near syncope   Thrombocytosis (HCC)     Subjective   Breathing much better today. Feels weak. Has diuresed well with a UOP of 2 L for the past 24 hours and 4.3 L for the admission. Renal function starting to increase this morning. Has been seen by oncology.   Inpatient Medications    Scheduled Meds: . aspirin  81 mg Oral Daily  . atorvastatin  40 mg Oral Daily  . carvedilol  6.25 mg Oral BID  . clopidogrel  75 mg Oral Daily  . docusate sodium  100 mg Oral BID  . enoxaparin (LOVENOX) injection  40 mg Subcutaneous Q24H  . furosemide  40 mg Intravenous BID  . pantoprazole  40 mg Oral Daily  . sacubitril-valsartan  1 tablet Oral BID  . sodium chloride flush  3 mL Intravenous Q12H  . spironolactone  25 mg Oral Daily   Continuous Infusions:  PRN Meds: acetaminophen **OR** acetaminophen, albuterol, ALPRAZolam, alum & mag hydroxide-simeth, bisacodyl, morphine injection, nitroGLYCERIN, ondansetron **OR** ondansetron (ZOFRAN) IV, oxyCODONE-acetaminophen   Vital Signs    Vitals:   05/04/16 1447 05/04/16 2003 05/05/16 0449 05/05/16 0830  BP: 110/64 107/61 110/65 112/66  Pulse: 74 69 73   Resp:  19 16   Temp:  98 F (36.7 C) 97.6 F (36.4 C)   TempSrc:   Oral   SpO2: 95% 95% 91%   Weight:   165 lb 4.8 oz (75 kg)   Height:        Intake/Output Summary (Last 24 hours) at 05/05/16 1047 Last data filed at 05/05/16 1045  Gross per 24 hour  Intake              720 ml  Output             1425 ml  Net             -705 ml   Filed Weights   05/03/16 0340  05/04/16 0500 05/05/16 0449  Weight: 175 lb 8 oz (79.6 kg) 172 lb 1.6 oz (78.1 kg) 165 lb 4.8 oz (75 kg)    Physical Exam    GEN: Well nourished, well developed, in no acute distress.  HEENT: Grossly normal.  Neck: Supple, JVD improved, though remains slightly elevated, no carotid bruits, or masses. Cardiac: RRR, no murmurs, rubs, or gallops. No clubbing, cyanosis, edema.  Radials/DP/PT 2+ and equal bilaterally.  Respiratory:  Respirations regular and unlabored, clear to auscultation bilaterally. GI: Soft, nontender, nondistended, BS + x 4. MS: no deformity or atrophy. Skin: warm and dry, no rash. Neuro:  Strength and sensation are intact. Psych: AAOx3.  Normal affect.  Labs    CBC  Recent Labs  05/02/16 1418 05/03/16 0701  WBC 69.4* 58.0*  NEUTROABS 59.7*  --   HGB 10.2* 9.3*  HCT 32.0* 28.4*  MCV 97.9 96.6  PLT 1,066* [id number]*   Basic Metabolic Panel  Recent Labs  05/03/16 0701 05/04/16 0423  NA 138 141  K 3.9 3.7  CL 107 106  CO2 23  26  GLUCOSE 125* 153*  BUN 18 18  CREATININE 0.93 1.22  CALCIUM 8.5* 8.4*   Liver Function Tests  Recent Labs  05/03/16 0701  AST 37  ALT 21  ALKPHOS 235*  BILITOT 1.8*  PROT 7.1  ALBUMIN 3.4*   No results for input(s): LIPASE, AMYLASE in the last 72 hours. Cardiac Enzymes  Recent Labs  05/02/16 1928 05/03/16 0103 05/03/16 0701  TROPONINI 0.16* 0.14* 0.14*   BNP Invalid input(s): POCBNP D-Dimer No results for input(s): DDIMER in the last 72 hours. Hemoglobin A1C No results for input(s): HGBA1C in the last 72 hours. Fasting Lipid Panel No results for input(s): CHOL, HDL, LDLCALC, TRIG, CHOLHDL, LDLDIRECT in the last 72 hours. Thyroid Function Tests No results for input(s): TSH, T4TOTAL, T3FREE, THYROIDAB in the last 72 hours.  Invalid input(s): FREET3  Telemetry    sinus rhythm, short run of atrial tachycardia around 8:30 AM on 1/23 - Personally Reviewed  ECG    n/a - Personally Reviewed  Radiology     No results found.  Cardiac Studies   [facility] 11/2014: FINAL CARDIAC CATHETERIZATION REPORT (Right and Left Heart) Conclusions: - No significant coronary artery disease - Pulmonary hypertension with mean of 28 and PVR of 2.34 - Mildly elevated wedge pressure at 19 - Elevated left ventricular end-diastolic pressure of 15 going up to 30  post A wave  Patient Profile     71 y.o. male with history of chronic combined CHF class III, NICM with cardiac cath in 2016 showing nonobstructive CAD, previously homeless in November and December 2017 now living in a church, CKD stage II-III, medication noncompliance, CVA x 2 without Afib work up, atrial tachycardia on amiodarone, HTN, ETOH abuse, and CML not currently under treatment who was recently admitted in mid December 2017 for volume overload returned to [facility] with increased SOB and nonexertional chest pain.  Assessment & Plan    1. Acute on chronic combined CHF, NYHA class III: -Much improved -Renal function increasing on IV Lasix 40 mg bid, will change back to home PO dose of Lasix 80 mg bid with KCl repletion  -Monitor UOP and daily weights -His compliance at home is uncertain -Continue Coreg, Entresto, spironolactone  -Digoxin discontinued given compliance issues -No need to repeat echo given recent study from 03/2016  2. Elevated troponin/atypical chest pain: -Chest pain reproducible to palpation on exam -Unlikely ACS -Continue ASA and Plavix -As below  3. NICM: -By cardiac cath in 2016 -No plans for inpatient ischemic evaluation   4. Near syncope: -Orthostatic vital signs negative s/p IV fluids  5. CML: -Oncology has been consulted  -Needs follow up with [facility]  6. Noncompliance with medical regimen presenting significant health hazards: -Compliance advised   7. CKD stage II: -Renal function starting to increase -Will change IV Lasix back to his home PO dose of 80 mg bid  8. Atrial arrhythmia: -Episode of possible  atrial tach vs SVT in the AM of 1/23, none since -Continue increased dose of Coreg 6.25 mg bid -Has not been receiving his home amiodarone this admission, will restart this at 200 mg daily as he has been on this medication for a while and is likely already loaded given the long half-life -Will need follow up with primary cardiologist regarding ongoing treatment   Signed, Marcille Blanco [place] Pager: [phone] 05/05/2016, 10:47 AM

## 2016-05-05 NOTE — Discharge Summary (Signed)
[facility] [place] NAME: Bruce [name]    MR#:  [id number]  DATE OF BIRTH:  16-May-1945  DATE OF ADMISSION:  05/02/2016 ADMITTING PHYSICIAN: Idelle Crouch, MD  DATE OF DISCHARGE: No discharge date for patient encounter.  PRIMARY CARE PHYSICIAN: [name], MD     ADMISSION DIAGNOSIS:  Thrombocytosis ([place]) [D47.3] Leukocytosis, unspecified type [D72.829] Chest pain, unspecified type [R07.9]  DISCHARGE DIAGNOSIS:  Principal Problem:   Acute on chronic combined systolic and diastolic CHF, NYHA class 3 (HCC) Active Problems:   Acute on chronic combined systolic and diastolic CHF (congestive heart failure) (HCC)   Elevated troponin   CKD (chronic kidney disease), stage II   Chest pain   Near syncope   Atypical chest pain   Cardiomyopathy   Atrial arrhythmia   CML (chronic myelocytic leukemia) (HCC)   Coronary artery disease, non-occlusive   Compliance poor   Homeless   Thrombocytosis ([place])   SECONDARY DIAGNOSIS:   Past Medical History:  Diagnosis Date  . Bell's palsy   . Chronic combined systolic and diastolic CHF, NYHA class 3 ([place])   . CKD (chronic kidney disease), stage II   . CML (chronic myelocytic leukemia) ([place])   . Coronary artery disease, non-occlusive   . Hypercholesterolemia   . Hypertension   . Leukemia ([place])   . Stroke ([place])    No residual limb weakness.  Walks with cane at baseline.   Marland Kitchen TIA (transient ischemic attack) 05/10/2014    .pro HOSPITAL COURSE:   The patient is 71 year old male with past medical history significant for CK D stage II, CML, combined systolic and diastolic CHF, hyperlipidemia, hypertension, stroke, who presents to the hospital with chest pain, dizziness and weakness, near syncope. On arrival to emergency room, his troponin was found to be mildly elevated and he was admitted to the hospital . Chest x-ray revealed cardiomegaly and no significant pulmonary vascular  congestion. Head CT was unremarkable. Labs showed leukocytosis. Patient was admitted to the hospital and diuresed since he was about 10 pounds above his dry weight. He was seen by cardiologist who felt that patient had acute on chronic systolic CHF. He recommended to continue IV furosemide. He also felt that patient's atypical chest pain was very likely in the setting of volume overload but not due to acute coronary syndrome. Cardiologist also felt that patient may benefit from pharmacologic nuclear stress test when his volume status improves. The patient was reassessed, and felt that patient's chest pain was reproducible to palpation on exam, it was unlikely acute coronary syndrome, cardiologist recommended to continue aspirin and Plavix, follow-up with primary cardiologist as outpatient. The patient was also seen by Dr. Grayland Ormond, who recommended for him to follow-up in the cancer center for [facility] evaluation and treatment. With conservative therapy, patient improved and he was ready to be discharged home today. Discussion by problem: 1. Acute on chronic systolic congestive heart failure. Patient diuresed well so far. Compliance with medications could be the issues since he is homeless. He states he is now found a place to live. Continue IV Lasix, entresto and Coreg 2. Elevated troponin likely demand ischemia with heart failure. Case discussed with cardiology and they ordered a stress test for tomorrow 3. Slurred speech which is chronic. Continue aspirin and Plavix. MRI of the brain Could not be completed secondary to claustrophobia despite medications 4. Hyperlipidemia unspecified on atorvastatin 5. History of CML with leukocytosis and thrombocytosis. Dr. Grayland Ormond states he will  follow up the patient as outpatient DISCHARGE CONDITIONS:   Stable  CONSULTS OBTAINED:  Treatment Team:  Lequita Asal, MD Wellington Hampshire, MD  DRUG ALLERGIES:  No Known Allergies  DISCHARGE MEDICATIONS:   Current  Discharge Medication List    CONTINUE these medications which have CHANGED   Details  carvedilol (COREG) 6.25 MG tablet Take 1 tablet (6.25 mg total) by mouth 2 (two) times daily. Qty: 60 tablet, Refills: 6    spironolactone (ALDACTONE) 25 MG tablet Take 1 tablet (25 mg total) by mouth daily. Qty: 30 tablet, Refills: 6      CONTINUE these medications which have NOT CHANGED   Details  acetaminophen (TYLENOL) 325 MG tablet Take 2 tablets (650 mg total) by mouth every 4 (four) hours as needed for headache or mild pain. Qty: 60 tablet, Refills: 3    albuterol (PROVENTIL HFA;VENTOLIN HFA) 108 (90 BASE) MCG/ACT inhaler Inhale 2 puffs into the lungs every 4 (four) hours as needed for wheezing or shortness of breath. Qty: 1 Inhaler, Refills: 3    ALPRAZolam (XANAX) 0.25 MG tablet Take 0.25 mg by mouth 2 (two) times daily as needed for anxiety.    aspirin 81 MG chewable tablet Chew 1 tablet (81 mg total) by mouth daily. Qty: 30 tablet, Refills: 0    atorvastatin (LIPITOR) 40 MG tablet Take 1 tablet (40 mg total) by mouth daily. Qty: 30 tablet, Refills: 5    clopidogrel (PLAVIX) 75 MG tablet Take 1 tablet (75 mg total) by mouth daily. Qty: 30 tablet, Refills: 3    ENTRESTO 24-26 MG Take 1 tablet by mouth 2 (two) times daily. Refills: 0    furosemide (LASIX) 80 MG tablet Take 80 mg by mouth 2 (two) times daily. Refills: 0    amiodarone (PACERONE) 200 MG tablet Take 1 tablet (200 mg total) by mouth daily. Qty: 30 tablet, Refills: 5      STOP taking these medications     digoxin (LANOXIN) 0.125 MG tablet      torsemide (DEMADEX) 20 MG tablet          DISCHARGE INSTRUCTIONS:    The patient is to follow-up with primary care physician and primary cardiologist, Dr. Grayland Ormond in [place]  If you experience worsening of your admission symptoms, develop shortness of breath, life threatening emergency, suicidal or homicidal thoughts you must seek medical attention immediately by  calling 911 or calling your MD immediately  if symptoms less severe.  You Must read complete instructions/literature along with all the possible adverse reactions/side effects for all the Medicines you take and that have been prescribed to you. Take any new Medicines after you have completely understood and accept all the possible adverse reactions/side effects.   Please note  You were cared for by a hospitalist during your hospital stay. If you have any questions about your discharge medications or the care you received while you were in the hospital after you are discharged, you can call the unit and asked to speak with the hospitalist on call if the hospitalist that took care of you is not available. Once you are discharged, your primary care physician will handle any further medical issues. Please note that NO REFILLS for any discharge medications will be authorized once you are discharged, as it is imperative that you return to your primary care physician (or establish a relationship with a primary care physician if you do not have one) for your aftercare needs so that they can reassess your  need for medications and monitor your lab values.    Today   CHIEF COMPLAINT:   Chief Complaint  Patient presents with  . Chest Pain    HISTORY OF PRESENT ILLNESS:  Bruce [name]  is a 71 y.o. male with a known history of CK D stage II, CML, combined systolic and diastolic CHF, hyperlipidemia, hypertension, stroke, who presents to the hospital with chest pain, dizziness and weakness, near syncope. On arrival to emergency room, his troponin was found to be mildly elevated and he was admitted to the hospital . Chest x-ray revealed cardiomegaly and no significant pulmonary vascular congestion. Head CT was unremarkable. Labs showed leukocytosis. Patient was admitted to the hospital and diuresed since he was about 10 pounds above his dry weight. He was seen by cardiologist who felt that patient had acute on  chronic systolic CHF. He recommended to continue IV furosemide. He also felt that patient's atypical chest pain was very likely in the setting of volume overload but not due to acute coronary syndrome. Cardiologist also felt that patient may benefit from pharmacologic nuclear stress test when his volume status improves. The patient was reassessed, and felt that patient's chest pain was reproducible to palpation on exam, it was unlikely acute coronary syndrome, cardiologist recommended to continue aspirin and Plavix, follow-up with primary cardiologist as outpatient. The patient was also seen by Dr. Grayland Ormond, who recommended for him to follow-up in the cancer center for [facility] evaluation and treatment. With conservative therapy, patient improved and he was ready to be discharged home today. Discussion by problem: 6. Acute on chronic systolic congestive heart failure. Patient diuresed well so far. Compliance with medications could be the issues since he is homeless. He states he is now found a place to live. Continue IV Lasix, entresto and Coreg 7. Elevated troponin likely demand ischemia with heart failure. Case discussed with cardiology and they ordered a stress test for tomorrow 8. Slurred speech which is chronic. Continue aspirin and Plavix. MRI of the brain Could not be completed secondary to claustrophobia despite medications 9. Hyperlipidemia unspecified on atorvastatin 10. History of CML with leukocytosis and thrombocytosis. Dr. Grayland Ormond states he will follow up the patient as outpatient    VITAL SIGNS:  Blood pressure 106/63, pulse 63, temperature 97.6 F (36.4 C), temperature source Oral, resp. rate 10, height 5\' 8"  (1.727 m), weight 75 kg (165 lb 4.8 oz), SpO2 99 %.  I/O:   Intake/Output Summary (Last 24 hours) at 05/05/16 1435 Last data filed at 05/05/16 1400  Gross per 24 hour  Intake              720 ml  Output             1125 ml  Net             -405 ml    PHYSICAL EXAMINATION:   GENERAL:  71 y.o.-year-old patient lying in the bed with no acute distress.  EYES: Pupils equal, round, reactive to light and accommodation. No scleral icterus. Extraocular muscles intact.  HEENT: Head atraumatic, normocephalic. Oropharynx and nasopharynx clear.  NECK:  Supple, no jugular venous distention. No thyroid enlargement, no tenderness.  LUNGS: Normal breath sounds bilaterally, no wheezing, rales,rhonchi or crepitation. No use of accessory muscles of respiration.  CARDIOVASCULAR: S1, S2 normal. No murmurs, rubs, or gallops.  ABDOMEN: Soft, non-tender, non-distended. Bowel sounds present. No organomegaly or mass.  EXTREMITIES: No pedal edema, cyanosis, or clubbing.  NEUROLOGIC: Cranial nerves II through XII are  intact. Muscle strength 5/5 in all extremities. Sensation intact. Gait not checked.  PSYCHIATRIC: The patient is alert and oriented x 3.  SKIN: No obvious rash, lesion, or ulcer.   DATA REVIEW:   CBC  Recent Labs Lab 05/03/16 0701  WBC 58.0*  HGB 9.3*  HCT 28.4*  PLT 941*    Chemistries   Recent Labs Lab 05/03/16 0701 05/04/16 0423  NA 138 141  K 3.9 3.7  CL 107 106  CO2 23 26  GLUCOSE 125* 153*  BUN 18 18  CREATININE 0.93 1.22  CALCIUM 8.5* 8.4*  AST 37  --   ALT 21  --   ALKPHOS 235*  --   BILITOT 1.8*  --     Cardiac Enzymes  Recent Labs Lab 05/03/16 0701  TROPONINI 0.14*    Microbiology Results  Results for orders placed or performed during the hospital encounter of 03/27/16  Culture, blood (Routine x 2)     Status: None   Collection Time: 03/27/16  5:35 PM  Result Value Ref Range Status   Specimen Description BLOOD LEFT ANTECUBITAL  Final   Special Requests BOTTLES DRAWN AEROBIC AND ANAEROBIC 5CC  Final   Culture NO GROWTH 5 DAYS  Final   Report Status 04/01/2016 FINAL  Final  Culture, blood (Routine x 2)     Status: None   Collection Time: 03/27/16  5:55 PM  Result Value Ref Range Status   Specimen Description BLOOD RIGHT  ANTECUBITAL  Final   Special Requests BOTTLES DRAWN AEROBIC AND ANAEROBIC 5CC  Final   Culture NO GROWTH 5 DAYS  Final   Report Status 04/01/2016 FINAL  Final  Urine culture     Status: Abnormal   Collection Time: 03/27/16  6:05 PM  Result Value Ref Range Status   Specimen Description URINE, RANDOM  Final   Special Requests NONE  Final   Culture <10,000 COLONIES/mL INSIGNIFICANT GROWTH (A)  Final   Report Status 03/29/2016 FINAL  Final    RADIOLOGY:  No results found.  EKG:   Orders placed or performed during the hospital encounter of 05/02/16  . ED EKG within 10 minutes  . ED EKG within 10 minutes  . EKG 12-Lead  . EKG 12-Lead      Management plans discussed with the patient, family and they are in agreement.  CODE STATUS:     Code Status Orders        Start     Ordered   05/02/16 1900  Full code  Continuous     05/02/16 1859    Code Status History    Date Active Date Inactive Code Status Order ID Comments User Context   03/21/2016  6:40 PM 03/23/2016  6:39 PM Full Code [id number]  Idelle Crouch, MD Inpatient   10/10/2015 11:59 PM 10/15/2015  7:08 PM Full Code [id number]  Ivor Costa, MD ED   06/04/2015  1:54 AM 06/06/2015  5:28 PM Full Code [id number]  [name], MD ED   01/25/2015  3:16 AM 01/29/2015  5:17 PM Full Code [id number]  Lavina Hamman, MD ED   09/13/2014  5:07 PM 09/22/2014  6:09 PM Full Code [id number]  Flonnie Overman Dhungel, MD Inpatient   03/20/2014 11:33 PM 03/21/2014  5:46 PM Full Code [id number]  Arne Cleveland, MD Inpatient   03/18/2014 11:46 PM 03/20/2014 11:33 PM Full Code [id number]  [name], MD Inpatient   03/05/2014  9:04 PM 03/06/2014  6:57 PM Full Code [id number]  [name],  MD Inpatient      TOTAL TIME TAKING CARE OF THIS PATIENT: 40 minutes.    Theodoro Grist M.D on 05/05/2016 at 2:35 PM  Between 7am to 6pm - Pager - [phone]  After 6pm go to [email or web address] - password [email or web address] [facility]  [facility]  Office   [phone]  CC: Primary care physician; [name], MD

## 2016-05-06 LAB — CBC
HCT: 30.1 % — ABNORMAL LOW (ref 40.0–52.0)
HEMOGLOBIN: 9.6 g/dL — AB (ref 13.0–18.0)
MCH: 31 pg (ref 26.0–34.0)
MCHC: 32 g/dL (ref 32.0–36.0)
MCV: 96.9 fL (ref 80.0–100.0)
PLATELETS: 836 10*3/uL — AB (ref 150–440)
RBC: 3.1 MIL/uL — AB (ref 4.40–5.90)
RDW: 19.2 % — ABNORMAL HIGH (ref 11.5–14.5)
WBC: 66.2 10*3/uL — AB (ref 3.8–10.6)

## 2016-05-06 LAB — GLUCOSE, CAPILLARY: Glucose-Capillary: 115 mg/dL — ABNORMAL HIGH (ref 65–99)

## 2016-05-06 MED ORDER — MECLIZINE HCL 25 MG PO TABS: 25.0000 mg | ORAL_TABLET | Freq: Three times a day (TID) | ORAL | Status: DC | PRN

## 2016-05-06 MED ORDER — LORAZEPAM 2 MG/ML IJ SOLN
1.0000 mg | Freq: Once | INTRAMUSCULAR | Status: AC
  Administered 2016-05-07: 1 mg via INTRAVENOUS
  Filled 2016-05-06: qty 1

## 2016-05-06 NOTE — Progress Notes (Signed)
Patient ID: Bruce [name], male   DOB: 1945-12-20, 71 y.o.   MRN: [id number] .rw   [employer] PROGRESS NOTE  Bruce [name] [id number] DOB: 1946-03-11 DOA: 05/02/2016 PCP: [name], MD  HPI/Subjective: Patient complains of significant dizziness, not the kind that room is spinning around, but being unsteady on the feet and falling. Wants to know if he had stroke . MRI of brain is scheduled with sedation    Objective: Vitals:   05/06/16 1128 05/06/16 1221  BP:  134/78  Pulse: (!) 56 78  Resp:  18  Temp:      Filed Weights   05/04/16 0500 05/05/16 0449 05/06/16 0423  Weight: 78.1 kg (172 lb 1.6 oz) 75 kg (165 lb 4.8 oz) 73.9 kg (163 lb)    ROS: Review of Systems  Constitutional: Negative for chills and fever.  Eyes: Negative for blurred vision.  Respiratory: Positive for cough and shortness of breath.   Cardiovascular: Negative for chest pain.  Gastrointestinal: Negative for abdominal pain, constipation, diarrhea, nausea and vomiting.  Genitourinary: Negative for dysuria.  Musculoskeletal: Negative for joint pain.  Neurological: Positive for speech change and focal weakness. Negative for dizziness and headaches.   Exam: Physical Exam    Data Reviewed: Basic Metabolic Panel:  Recent Labs Lab 05/02/16 1116 05/03/16 0701 05/04/16 0423  NA 139 138 141  K 3.8 3.9 3.7  CL 108 107 106  CO2 22 23 26   GLUCOSE 152* 125* 153*  BUN 18 18 18   CREATININE 1.04 0.93 1.22  CALCIUM 8.9 8.5* 8.4*   Liver Function Tests:  Recent Labs Lab 05/03/16 0701  AST 37  ALT 21  ALKPHOS 235*  BILITOT 1.8*  PROT 7.1  ALBUMIN 3.4*   CBC:  Recent Labs Lab 05/02/16 1116 05/02/16 1418 05/03/16 0701 05/06/16 0451  WBC 67.3* 69.4* 58.0* 66.2*  NEUTROABS  --  59.7*  --   --   HGB 9.4* 10.2* 9.3* 9.6*  HCT 29.3* 32.0* 28.4* 30.1*  MCV 97.4 97.9 96.6 96.9  PLT 977* 1,066* 941* 836*   Cardiac Enzymes:  Recent Labs Lab 05/02/16 1116 05/02/16 1928  05/03/16 0103 05/03/16 0701  TROPONINI 0.15* 0.16* 0.14* 0.14*   BNP (last 3 results)  Recent Labs  03/27/16 1629 04/04/16 1103 05/02/16 1115  BNP 2,790.9* 1,141.8* 2,219.0*     CBG:  Recent Labs Lab 05/03/16 1718 05/05/16 0755 05/05/16 1151 05/05/16 1704 05/06/16 0745  GLUCAP 130* 132* 259* 108* 115*      Scheduled Meds: . amiodarone  200 mg Oral Daily  . aspirin  81 mg Oral Daily  . atorvastatin  40 mg Oral Daily  . carvedilol  6.25 mg Oral BID  . clopidogrel  75 mg Oral Daily  . docusate sodium  100 mg Oral BID  . enoxaparin (LOVENOX) injection  40 mg Subcutaneous Q24H  . furosemide  80 mg Oral BID  . LORazepam  1 mg Intravenous Once  . pantoprazole  40 mg Oral Daily  . sacubitril-valsartan  1 tablet Oral BID  . sodium chloride flush  3 mL Intravenous Q12H  . spironolactone  25 mg Oral Daily    Assessment/Plan:  1. Acute on chronic systolic congestive heart failure. Patient diuresed well . Of more than 4.5 L during his stay in the hospital time weaned off oxygen to room air with good O2 saturations. Compliance with medications could be the issues since he is homeless. . Continue Lasix, entresto and Coreg. Cardiology saw patient in consultation  and recommended not to repeat echo since it was done in December 2017, no inpatient ischemic evaluation was also recommended by cardiologist, status post cardiac catheterization in 2016 revealing no evidence of coronary artery disease. 2. Elevated troponin likely demand ischemia with heart failure. Case discussed with cardiology, no stress test was recommended, but medical management.  3. Slurred speech which is chronic. Continue aspirin and Plavix. MRI of the brain is ordered with premedication with Ativan, discussed the importance, patient voiced understanding. Getting neurologist involved for recommendations, physical therapist saw patient in consultation and recommended rehabilitation placement, where the patient will be  discharged tomorrow 4. Hyperlipidemia unspecified on atorvastatin 5. CML with leukocytosis and thrombocytosis. Dr. Grayland Ormond  will follow up the patient as outpatient 6. Dizziness, unsteadiness on the feet, attempting MRI of the brain, if unsuccessful, neurology consultation will be obtained, patient is recommended rehabilitation treatment in the facility by physical therapist, he will be discharged to skilled nursing facility tomorrow  Code Status:     Code Status Orders        Start     Ordered   05/02/16 1900  Full code  Continuous     05/02/16 1859    Code Status History    Date Active Date Inactive Code Status Order ID Comments User Context   03/21/2016  6:40 PM 03/23/2016  6:39 PM Full Code [id number]  Bruce Crouch, MD Inpatient   10/10/2015 11:59 PM 10/15/2015  7:08 PM Full Code [id number]  Bruce Costa, MD ED   06/04/2015  1:54 AM 06/06/2015  5:28 PM Full Code [id number]  [name], MD ED   01/25/2015  3:16 AM 01/29/2015  5:17 PM Full Code [id number]  Bruce Hamman, MD ED   09/13/2014  5:07 PM 09/22/2014  6:09 PM Full Code [id number]  Bruce Overman Dhungel, MD Inpatient   03/20/2014 11:33 PM 03/21/2014  5:46 PM Full Code [id number]  Bruce Cleveland, MD Inpatient   03/18/2014 11:46 PM 03/20/2014 11:33 PM Full Code [id number]  [name], MD Inpatient   03/05/2014  9:04 PM 03/06/2014  6:57 PM Full Code [id number]  [name], MD Inpatient      Disposition Plan: SNF tomorrow  Consultants:  Cardiology  Time spent: 35 minutes  [place]

## 2016-05-06 NOTE — Progress Notes (Signed)
Physical Therapy Treatment Patient Details Name: Bruce [name] MRN: [id number] DOB: 12/18/1945 Today's Date: 05/06/2016    History of Present Illness Pt is a 71 yo male w/ significant history of CML, CAD, HTN, CKD, who came into the hospital w/ complaints of; weaknes, dizziness, and chest pain.     PT Comments    PT agreeable to PT; "I need to walk". Pt complains of general aches and pain pt refers to as "normal". Continues with complains of dizziness that worsens mildly with stand/ambulation. Pt demonstrates drifting ambulation with a number of pauses and some safety issues including feet being outside the base of the rolling walker at times and rolling walker abandonment in the room with need for furniture ambulation. Blood pressure checked post ambulation in sit and stand with readings of 109/57 and 111/61 respectively. O2 saturation above 96% and HR 74 beats per minute. Information shared with MD. Continue PT to progress ambulation quality and safety and upright tolerance.   Follow Up Recommendations  SNF  Equipment Recommendations  Rolling walker with 5" wheels    Recommendations for Other Services       Precautions / Restrictions Precautions Precautions: Fall Restrictions Weight Bearing Restrictions: No    Mobility  Bed Mobility Overal bed mobility: Modified Independent             General bed mobility comments: Mild increased time and use of rails  Transfers Overall transfer level: Needs assistance Equipment used: Rolling walker (2 wheeled) Transfers: Sit to/from Stand Sit to Stand: Min guard (for safety due to complaints of dizziness)         General transfer comment: complains of increased dizziness with stand  Ambulation/Gait Ambulation/Gait assistance: Min guard;Min assist Ambulation Distance (Feet): 250 Feet (several stand rest breaks) Assistive device: Rolling walker (2 wheeled) Gait Pattern/deviations: Step-through pattern;Drifts right/left Gait  velocity: reduced Gait velocity interpretation: <1.8 ft/sec, indicative of risk for recurrent falls General Gait Details: Mildly unsafe at times with feet outside of rw base. Driifts R/L throughout walk. several pauses due to mild fatigue   Stairs            Wheelchair Mobility    Modified Rankin (Stroke Patients Only)       Balance Overall balance assessment: Needs assistance Sitting-balance support: Feet supported;Bilateral upper extremity supported;Single extremity supported Sitting balance-Leahy Scale: Good     Standing balance support: Bilateral upper extremity supported Standing balance-Leahy Scale: Fair                      Cognition Arousal/Alertness: Awake/alert Behavior During Therapy: WFL for tasks assessed/performed Overall Cognitive Status: Within Functional Limits for tasks assessed                      Exercises      General Comments        Pertinent Vitals/Pain Faces Pain Scale: Hurts a little bit Pain Location:  ([place], "normal" pains; does not specify where)    Home Living                      Prior Function            PT Goals (current goals can now be found in the care plan section) Progress towards PT goals: Progressing toward goals    Frequency    Min 2X/week      PT Plan Current plan remains appropriate    Co-evaluation  End of Session Equipment Utilized During Treatment: Gait belt Activity Tolerance: Patient limited by fatigue (dizziness) Patient left: Other (comment) (sitting edge of bed; MD in the room)     Time: 1127-1150 PT Time Calculation (min) (ACUTE ONLY): 23 min  Charges:  $Gait Training: 23-37 mins                    G CodesLarae Grooms, PTA 05/06/2016, 12:03 PM

## 2016-05-06 NOTE — Clinical Social Work Note (Signed)
CSW presented bed offers to patient and he chose [facility] for rehab.  Jones Broom. Standard, MSW, [place]  05/06/2016 1:43 PM

## 2016-05-06 NOTE — Care Management (Signed)
Barrier- significant dizziness and need for mri.

## 2016-05-07 ENCOUNTER — Observation Stay: Payer: Medicare Other

## 2016-05-07 DIAGNOSIS — I5023 Acute on chronic systolic (congestive) heart failure: Secondary | ICD-10-CM

## 2016-05-07 DIAGNOSIS — R2681 Unsteadiness on feet: Secondary | ICD-10-CM

## 2016-05-07 LAB — CREATININE, SERUM
CREATININE: 1.32 mg/dL — AB (ref 0.61–1.24)
GFR calc Af Amer: >60 mL/min (ref >60)
GFR calc non Af Amer: 53 mL/min — ABNORMAL LOW (ref >60)

## 2016-05-07 LAB — GLUCOSE, CAPILLARY: Glucose-Capillary: 135 mg/dL — ABNORMAL HIGH (ref 65–99)

## 2016-05-07 MED ORDER — MECLIZINE HCL 25 MG PO TABS: 25.0000 mg | ORAL_TABLET | Freq: Three times a day (TID) | ORAL | 0 refills | Status: DC | PRN

## 2016-05-07 NOTE — Discharge Summary (Addendum)
[facility] [place] NAME: Bruce [name]    MR#:  [id number]  DATE OF BIRTH:  09-01-1945  DATE OF ADMISSION:  05/02/2016 ADMITTING PHYSICIAN: Idelle Crouch, MD  DATE OF DISCHARGE: 05/07/2016.  PRIMARY CARE PHYSICIAN: [name], MD     ADMISSION DIAGNOSIS:  Thrombocytosis ([place]) [D47.3] Leukocytosis, unspecified type [D72.829] Chest pain, unspecified type [R07.9]  DISCHARGE DIAGNOSIS:  Principal Problem:   Acute on chronic combined systolic and diastolic CHF, NYHA class 3 (HCC) Active Problems:   Acute on chronic combined systolic and diastolic CHF (congestive heart failure) (HCC)   Acute on chronic systolic CHF (congestive heart failure) (HCC)   Elevated troponin   CKD (chronic kidney disease), stage II   Chest pain   Near syncope   Thrombocytosis (HCC)   Atypical chest pain   Cardiomyopathy   Atrial arrhythmia   CML (chronic myelocytic leukemia) (HCC)   Coronary artery disease, non-occlusive   Compliance poor   Homeless   Muscle weakness (generalized)   Unsteady gait   SECONDARY DIAGNOSIS:   Past Medical History:  Diagnosis Date  . Bell's palsy   . Chronic combined systolic and diastolic CHF, NYHA class 3 ([place])   . CKD (chronic kidney disease), stage II   . CML (chronic myelocytic leukemia) ([place])   . Coronary artery disease, non-occlusive   . Hypercholesterolemia   . Hypertension   . Leukemia ([place])   . Stroke ([place])    No residual limb weakness.  Walks with cane at baseline.   Marland Kitchen TIA (transient ischemic attack) 05/10/2014    .pro HOSPITAL COURSE:   The patient is 71 year old male with past medical history significant for CK D stage II, CML, combined systolic and diastolic CHF, hyperlipidemia, hypertension, stroke, who presents to the hospital with chest pain, dizziness and weakness, near syncope. On arrival to emergency room, his troponin was found to be mildly elevated and he was admitted to the  hospital . Chest x-ray revealed cardiomegaly and no significant pulmonary vascular congestion. Head CT was unremarkable. Labs showed leukocytosis. Patient was admitted to the hospital and diuresed since he was about 10 pounds above his dry weight. He was seen by cardiologist who felt that patient had acute on chronic systolic CHF. He recommended to continue IV furosemide. He also felt that patient's atypical chest pain was very likely in the setting of volume overload but not due to acute coronary syndrome. Cardiologist also felt that patient may benefit from pharmacologic nuclear stress test when his volume status improves. The patient was reassessed, and felt that patient's chest pain was reproducible to palpation on exam, it was unlikely acute coronary syndrome, cardiologist recommended to continue aspirin and Plavix, follow-up with primary cardiologist as outpatient. The patient was also seen by Dr. Grayland Ormond, who recommended for him to follow-up in the cancer center for [facility] evaluation and treatment. With conservative therapy, patient improved and he was ready to be discharged . He was evaluated by physical therapist and because of his significant unsteadiness and falls, recommended skilled nursing facility placement for which he was agreeable..   Discussion by problem: 1. Acute on chronic systolic congestive heart failure. Patient diuresed almost 6  L during his stay in the hospital time, weaned off oxygen to room air with good O2 saturations. Compliance with medications could be the issues since he is homeless. . Continue Lasix, entresto and Coreg. Cardiology saw patient in consultation and recommended not to repeat echo since it was  done in December 2017, no inpatient ischemic evaluation was also recommended by cardiologist, status post cardiac catheterization in 2016 revealing no evidence of coronary artery disease. 2. Elevated troponin  due to demand ischemia with heart failure. Case discussed with  cardiology, no stress test was recommended, but medical management.  3. Slurred speech and ataxia which is chronic. Continue aspirin and Plavix. MRI of the brain is pending, if negative may benefit from vestibular manipulation. Patient was seen by physical therapists this hospitalization and recommended rehabilitation placement, where the patient will be discharged today. Ordering meclizine as needed 4. Hyperlipidemia unspecified on atorvastatin 5. CML with leukocytosis and thrombocytosis. Dr. Grayland Ormond  will follow up the patient as outpatient 6. Dizziness, unsteadiness on the feet, MRI of the brain results are pending, meclizine as ordered, discharging to skilled nursing facility for rehabilitation today DISCHARGE CONDITIONS:   Stable  CONSULTS OBTAINED:  Treatment Team:  Lequita Asal, MD  DRUG ALLERGIES:  No Known Allergies  DISCHARGE MEDICATIONS:   Current Discharge Medication List    START taking these medications   Details  meclizine (ANTIVERT) 25 MG tablet Take 1 tablet (25 mg total) by mouth 3 (three) times daily as needed for dizziness. Qty: 30 tablet, Refills: 0      CONTINUE these medications which have CHANGED   Details  carvedilol (COREG) 6.25 MG tablet Take 1 tablet (6.25 mg total) by mouth 2 (two) times daily. Qty: 60 tablet, Refills: 6    spironolactone (ALDACTONE) 25 MG tablet Take 1 tablet (25 mg total) by mouth daily. Qty: 30 tablet, Refills: 6      CONTINUE these medications which have NOT CHANGED   Details  acetaminophen (TYLENOL) 325 MG tablet Take 2 tablets (650 mg total) by mouth every 4 (four) hours as needed for headache or mild pain. Qty: 60 tablet, Refills: 3    albuterol (PROVENTIL HFA;VENTOLIN HFA) 108 (90 BASE) MCG/ACT inhaler Inhale 2 puffs into the lungs every 4 (four) hours as needed for wheezing or shortness of breath. Qty: 1 Inhaler, Refills: 3    ALPRAZolam (XANAX) 0.25 MG tablet Take 0.25 mg by mouth 2 (two) times daily as needed  for anxiety.    aspirin 81 MG chewable tablet Chew 1 tablet (81 mg total) by mouth daily. Qty: 30 tablet, Refills: 0    atorvastatin (LIPITOR) 40 MG tablet Take 1 tablet (40 mg total) by mouth daily. Qty: 30 tablet, Refills: 5    clopidogrel (PLAVIX) 75 MG tablet Take 1 tablet (75 mg total) by mouth daily. Qty: 30 tablet, Refills: 3    ENTRESTO 24-26 MG Take 1 tablet by mouth 2 (two) times daily. Refills: 0    furosemide (LASIX) 80 MG tablet Take 80 mg by mouth 2 (two) times daily. Refills: 0    amiodarone (PACERONE) 200 MG tablet Take 1 tablet (200 mg total) by mouth daily. Qty: 30 tablet, Refills: 5      STOP taking these medications     digoxin (LANOXIN) 0.125 MG tablet      torsemide (DEMADEX) 20 MG tablet          DISCHARGE INSTRUCTIONS:    The patient is to follow-up with primary care physician and primary cardiologist, Dr. Grayland Ormond in [place]  If you experience worsening of your admission symptoms, develop shortness of breath, life threatening emergency, suicidal or homicidal thoughts you must seek medical attention immediately by calling 911 or calling your MD immediately  if symptoms less severe.  You Must read  complete instructions/literature along with all the possible adverse reactions/side effects for all the Medicines you take and that have been prescribed to you. Take any new Medicines after you have completely understood and accept all the possible adverse reactions/side effects.   Please note  You were cared for by a hospitalist during your hospital stay. If you have any questions about your discharge medications or the care you received while you were in the hospital after you are discharged, you can call the unit and asked to speak with the hospitalist on call if the hospitalist that took care of you is not available. Once you are discharged, your primary care physician will handle any further medical issues. Please note that NO REFILLS for any  discharge medications will be authorized once you are discharged, as it is imperative that you return to your primary care physician (or establish a relationship with a primary care physician if you do not have one) for your aftercare needs so that they can reassess your need for medications and monitor your lab values.    Today   CHIEF COMPLAINT:   Chief Complaint  Patient presents with  . Chest Pain    HISTORY OF PRESENT ILLNESS:  Mekhai Gaymon  is a 71 y.o. male with a known history of CK D stage II, CML, combined systolic and diastolic CHF, hyperlipidemia, hypertension, stroke, who presents to the hospital with chest pain, dizziness and weakness, near syncope. On arrival to emergency room, his troponin was found to be mildly elevated and he was admitted to the hospital . Chest x-ray revealed cardiomegaly and no significant pulmonary vascular congestion. Head CT was unremarkable. Labs showed leukocytosis. Patient was admitted to the hospital and diuresed since he was about 10 pounds above his dry weight. He was seen by cardiologist who felt that patient had acute on chronic systolic CHF. He recommended to continue IV furosemide. He also felt that patient's atypical chest pain was very likely in the setting of volume overload but not due to acute coronary syndrome. Cardiologist also felt that patient may benefit from pharmacologic nuclear stress test when his volume status improves. The patient was reassessed, and felt that patient's chest pain was reproducible to palpation on exam, it was unlikely acute coronary syndrome, cardiologist recommended to continue aspirin and Plavix, follow-up with primary cardiologist as outpatient. The patient was also seen by Dr. Grayland Ormond, who recommended for him to follow-up in the cancer center for [facility] evaluation and treatment. With conservative therapy, patient improved and he was ready to be discharged . He was evaluated by physical therapist and because of his  significant unsteadiness and falls, recommended skilled nursing facility placement for which he was agreeable..   Discussion by problem: 7. Acute on chronic systolic congestive heart failure. Patient diuresed almost 6  L during his stay in the hospital time, weaned off oxygen to room air with good O2 saturations. Compliance with medications could be the issues since he is homeless. . Continue Lasix, entresto and Coreg. Cardiology saw patient in consultation and recommended not to repeat echo since it was done in December 2017, no inpatient ischemic evaluation was also recommended by cardiologist, status post cardiac catheterization in 2016 revealing no evidence of coronary artery disease. 8. Elevated troponin  due to demand ischemia with heart failure. Case discussed with cardiology, no stress test was recommended, but medical management.  9. Slurred speech and ataxia which is chronic. Continue aspirin and Plavix. MRI of the brain is pending, if negative may  benefit from vestibular manipulation. Patient was seen by physical therapists this hospitalization and recommended rehabilitation placement, where the patient will be discharged today. Ordering meclizine as needed 10. Hyperlipidemia unspecified on atorvastatin 11. CML with leukocytosis and thrombocytosis. Dr. Grayland Ormond  will follow up the patient as outpatient Dizziness, unsteadiness on the feet, MRI of the brain results are pending, meclizine as ordered, discharging to skilled nursing facility for rehabilitation today   VITAL SIGNS:  Blood pressure 118/72, pulse 93, temperature 97.8 F (36.6 C), resp. rate 14, height 5\' 8"  (1.727 m), weight 74.1 kg (163 lb 6.4 oz), SpO2 99 %.  I/O:    Intake/Output Summary (Last 24 hours) at 05/07/16 1407 Last data filed at 05/07/16 0900  Gross per 24 hour  Intake              480 ml  Output             1950 ml  Net            -1470 ml    PHYSICAL EXAMINATION:  GENERAL:  71 y.o.-year-old patient lying  in the bed with no acute distress.  EYES: Pupils equal, round, reactive to light and accommodation. No scleral icterus. Extraocular muscles intact.  HEENT: Head atraumatic, normocephalic. Oropharynx and nasopharynx clear.  NECK:  Supple, no jugular venous distention. No thyroid enlargement, no tenderness.  LUNGS: Normal breath sounds bilaterally, no wheezing, rales,rhonchi or crepitation. No use of accessory muscles of respiration.  CARDIOVASCULAR: S1, S2 normal. No murmurs, rubs, or gallops.  ABDOMEN: Soft, non-tender, non-distended. Bowel sounds present. No organomegaly or mass.  EXTREMITIES: No pedal edema, cyanosis, or clubbing.  NEUROLOGIC: Cranial nerves II through XII are intact. Muscle strength 5/5 in all extremities. Sensation intact. Gait not checked.  PSYCHIATRIC: The patient is alert and oriented x 3.  SKIN: No obvious rash, lesion, or ulcer.   DATA REVIEW:   CBC  Recent Labs Lab 05/06/16 0451  WBC 66.2*  HGB 9.6*  HCT 30.1*  PLT 836*    Chemistries   Recent Labs Lab 05/03/16 0701 05/04/16 0423 05/07/16 0540  NA 138 141  --   K 3.9 3.7  --   CL 107 106  --   CO2 23 26  --   GLUCOSE 125* 153*  --   BUN 18 18  --   CREATININE 0.93 1.22 1.32*  CALCIUM 8.5* 8.4*  --   AST 37  --   --   ALT 21  --   --   ALKPHOS 235*  --   --   BILITOT 1.8*  --   --     Cardiac Enzymes  Recent Labs Lab 05/03/16 0701  TROPONINI 0.14*    Microbiology Results  Results for orders placed or performed during the hospital encounter of 03/27/16  Culture, blood (Routine x 2)     Status: None   Collection Time: 03/27/16  5:35 PM  Result Value Ref Range Status   Specimen Description BLOOD LEFT ANTECUBITAL  Final   Special Requests BOTTLES DRAWN AEROBIC AND ANAEROBIC 5CC  Final   Culture NO GROWTH 5 DAYS  Final   Report Status 04/01/2016 FINAL  Final  Culture, blood (Routine x 2)     Status: None   Collection Time: 03/27/16  5:55 PM  Result Value Ref Range Status    Specimen Description BLOOD RIGHT ANTECUBITAL  Final   Special Requests BOTTLES DRAWN AEROBIC AND ANAEROBIC 5CC  Final   Culture NO GROWTH 5  DAYS  Final   Report Status 04/01/2016 FINAL  Final  Urine culture     Status: Abnormal   Collection Time: 03/27/16  6:05 PM  Result Value Ref Range Status   Specimen Description URINE, RANDOM  Final   Special Requests NONE  Final   Culture <10,000 COLONIES/mL INSIGNIFICANT GROWTH (A)  Final   Report Status 03/29/2016 FINAL  Final    RADIOLOGY:  No results found.  EKG:   Orders placed or performed during the hospital encounter of 05/02/16  . ED EKG within 10 minutes  . ED EKG within 10 minutes  . EKG 12-Lead  . EKG 12-Lead      Management plans discussed with the patient, family and they are in agreement.  CODE STATUS:     Code Status Orders        Start     Ordered   05/02/16 1900  Full code  Continuous     05/02/16 1859    Code Status History    Date Active Date Inactive Code Status Order ID Comments User Context   03/21/2016  6:40 PM 03/23/2016  6:39 PM Full Code [id number]  Idelle Crouch, MD Inpatient   10/10/2015 11:59 PM 10/15/2015  7:08 PM Full Code [id number]  Ivor Costa, MD ED   06/04/2015  1:54 AM 06/06/2015  5:28 PM Full Code [id number]  [name], MD ED   01/25/2015  3:16 AM 01/29/2015  5:17 PM Full Code [id number]  Lavina Hamman, MD ED   09/13/2014  5:07 PM 09/22/2014  6:09 PM Full Code [id number]  Flonnie Overman Dhungel, MD Inpatient   03/20/2014 11:33 PM 03/21/2014  5:46 PM Full Code [id number]  Arne Cleveland, MD Inpatient   03/18/2014 11:46 PM 03/20/2014 11:33 PM Full Code [id number]  [name], MD Inpatient   03/05/2014  9:04 PM 03/06/2014  6:57 PM Full Code [id number]  [name], MD Inpatient      TOTAL TIME TAKING CARE OF THIS PATIENT: 40 minutes.    Theodoro Grist M.D on 05/07/2016 at 2:07 PM  Between 7am to 6pm - Pager - [phone]  After 6pm go to [email or web address] - password [email or web address] [facility]  [place]  [facility]  Office  [phone]  CC: Primary care physician; [name], MD

## 2016-05-07 NOTE — Progress Notes (Signed)
Patient refused to go with EMS,said he is going back to his shelter,MD notified  And agreed for patient to go back to the shelter.Case manager tried to talk to him as well but he refused.

## 2016-05-07 NOTE — Clinical Social Work Placement (Signed)
   CLINICAL SOCIAL WORK PLACEMENT  NOTE  Date:  05/07/2016  Patient Details  Name: Bruce [name] MRN: [id number] Date of Birth: 1945-09-19  Clinical Social Work is seeking post-discharge placement for this patient at the [place] level of care (*CSW will initial, date and re-position this form in  chart as items are completed):  Yes   Patient/family provided with [place] Work Department's list of facilities offering this level of care within the geographic area requested by the patient (or if unable, by the patient's family).  Yes   Patient/family informed of their freedom to choose among providers that offer the needed level of care, that participate in Medicare, Medicaid or managed care program needed by the patient, have an available bed and are willing to accept the patient.  Yes   Patient/family informed of [facility]'s ownership interest in [facility] and [facility], as well as of the fact that they are under no obligation to receive care at these facilities.  PASRR submitted to EDS on 05/05/16     PASRR number received on 05/05/16     Existing PASRR number confirmed on       FL2 transmitted to all facilities in geographic area requested by pt/family on 05/05/16     FL2 transmitted to all facilities within larger geographic area on       Patient informed that his/her managed care company has contracts with or will negotiate with certain facilities, including the following:        Yes   Patient/family informed of bed offers received.  Patient chooses bed at [facility]     Physician recommends and patient chooses bed at      Patient to be transferred to [facility] on 05/07/16.  Patient to be transferred to facility by [facility]     Patient family notified on 05/07/16 of transfer.  Name of family member notified:  Patient does not have any other family members.      PHYSICIAN Please sign FL2     Additional Comment:    _______________________________________________ Ross Ludwig, LCSWA 05/07/2016, 4:57 PM

## 2016-05-07 NOTE — Clinical Social Work Note (Signed)
CSW spoke to patient and he has decided not to go to SNF now, EMS arrived to transport patient, and patient refused to go to SNF.  Patient stated that he is going to try to contact someone to pick him up to bring him back to [facility] to the shelter where his clothes are and then when he is feeling better he will have someone bring him back to [facility] to pick up his car from the hospital.  Bedside nurse has been updated and he updated physician.  Jones Broom. [place], MSW, [place]  05/07/2016 6:00 PM

## 2016-05-07 NOTE — Progress Notes (Signed)
Patient discharged to [facility] via EMS,alert and oriented x3,hemodynamically stable.

## 2016-05-07 NOTE — Clinical Social Work Note (Signed)
Patient to be d/c'ed today to [facility].  Patient agreeable to plans will transport via ems RN to call report to [phone].  Evette Cristal, MSW, [place]

## 2016-05-07 NOTE — Progress Notes (Signed)
Patient alert and oriented. Stable. No signs and symptoms of distress. Verbalized desire to leave. Discussed with Dr. Clayton Bibles. Verbalized ok for patient to drive self home. Escorted off unit by orderly.

## 2016-05-07 NOTE — Clinical Social Work Note (Signed)
Clinical Social Work Assessment  Patient Details  Name: Bruce [name] MRN: [id number] Date of Birth: [birth year]  Date of referral:  05/05/16               Reason for consult:  Facility Placement                Permission sought to share information with:  Family Supports Permission granted to share information::  Yes, Verbal Permission Granted  Name::     Bruce [name] Relative   [phone]   Agency::  SNF admissions  Relationship::     Contact Information:     Housing/Transportation Living arrangements for the past 2 months:  [place] Paper of Information:  Patient Patient Interpreter Needed:  None Criminal Activity/Legal Involvement Pertinent to Current Situation/Hospitalization:  No - Comment as needed Significant Relationships:  Adult Children, Other Family Members Lives with:  Self Do you feel safe going back to the place where you live?  No Need for family participation in patient care:  No (Coment)  Care giving concerns:  Patient feels he needs some short term rehab before he is able to return back to the boarding house where he is staying.   Social Worker assessment / plan: Patient is a 71 year old male who has a car and lives in a boarding house in [place].  Patient is trying to move back to [place], but needs some rehab first before he can return back to boarding house.  Patient is alert and oriented x4 and able to express himself.  Patient states he has been to rehab in the past and is familiar with the process.  CSW explained to him how insurance will pay for his stay.  Patient says he has his car at [facility] hospital and would like SNF to bring him back to his car once he is done with therapy.  CSW informed SNF about his request, and they will work something out.  Patient expressed he is trying to find a more permanent place to stay and he says he receives social security each month and can pay for somewhere to stay after he is done  with his therapy.  Employment status:  Retired Nurse, adult PT Recommendations:  [place] / Referral to community resources:  [place]  Patient/Family's Response to care:  Patient in agreement to going to SNF for short term rehab.  Patient/Family's Understanding of and Emotional Response to Diagnosis, Current Treatment, and Prognosis:  Patient is worried about his clothes that are at boarding house, and is concerned that he may not be able to get back there after he is finished with rehab.  He is hopeful he will not have to be in rehab very long.  Emotional Assessment Appearance:  Appears stated age Attitude/Demeanor/Rapport:    Affect (typically observed):  Appropriate, Calm, Stable Orientation:  Oriented to Place, Oriented to Self, Oriented to  Time, Oriented to Situation Alcohol / Substance use:  Not Applicable Psych involvement (Current and /or in the community):  No (Comment)  Discharge Needs  Concerns to be addressed:  Lack of Support Readmission within the last 30 days:  No Current discharge risk:  Lack of support system Barriers to Discharge:  Continued Medical Work up   Anell Barr 05/07/2016, 4:44 PM

## 2016-05-10 NOTE — Progress Notes (Deleted)
[name]  [phone] [phone]  ID: NEQUAN FRANCHINA OB: Feb 15, 1946  MR#: [id number]  [id number]  Patient Care Team: [name], MD as PCP - General (Cardiology) Truitt Merle, MD as Consulting Physician (Hematology)  CHIEF COMPLAINT: CML  INTERVAL HISTORY: ***  REVIEW OF SYSTEMS:   ROS  As per HPI. Otherwise, a complete review of systems is negative.  PAST MEDICAL HISTORY: Past Medical History:  Diagnosis Date  . Bell's palsy   . Chronic combined systolic and diastolic CHF, NYHA class 3 ([place])   . CKD (chronic kidney disease), stage II   . CML (chronic myelocytic leukemia) ([place])   . Coronary artery disease, non-occlusive   . Hypercholesterolemia   . Hypertension   . Leukemia ([place])   . Stroke ([place])    No residual limb weakness.  Walks with cane at baseline.   Marland Kitchen TIA (transient ischemic attack) 05/10/2014    PAST SURGICAL HISTORY: Past Surgical History:  Procedure Laterality Date  . BACK SURGERY    . HIP ARTHROPLASTY Right    orif  . ORIF FOREARM FRACTURE Right     FAMILY HISTORY: Family History  Problem Relation Age of Onset  . Diabetes Mother   . Hypertension Mother   . Diabetes Father   . Hypertension Father   . Diabetes Brother   . Hypertension Brother   . Diabetes Sister   . Hypertension Sister   . Diabetes Brother   . Hypertension Brother   . Diabetes Sister   . Hypertension Sister     ADVANCED DIRECTIVES (Y/N):  N  HEALTH MAINTENANCE: Social History  Substance Use Topics  . Smoking status: Former Smoker    Packs/day: 0.50    Years: 50.00    Types: Cigarettes  . Smokeless tobacco: Never Used  . Alcohol use 0.6 oz/week    1 Cans of beer per week     Comment: daily      Colonoscopy:  PAP:  Bone density:  Lipid panel:  No Known Allergies  Current Outpatient Prescriptions  Medication Sig Dispense Refill  . acetaminophen (TYLENOL) 325 MG tablet Take 2 tablets (650 mg total) by mouth every  4 (four) hours as needed for headache or mild pain. 60 tablet 3  . albuterol (PROVENTIL HFA;VENTOLIN HFA) 108 (90 BASE) MCG/ACT inhaler Inhale 2 puffs into the lungs every 4 (four) hours as needed for wheezing or shortness of breath. 1 Inhaler 3  . ALPRAZolam (XANAX) 0.25 MG tablet Take 0.25 mg by mouth 2 (two) times daily as needed for anxiety.    Marland Kitchen amiodarone (PACERONE) 200 MG tablet Take 1 tablet (200 mg total) by mouth daily. (Patient not taking: Reported on 05/02/2016) 30 tablet 5  . aspirin 81 MG chewable tablet Chew 1 tablet (81 mg total) by mouth daily. 30 tablet 0  . atorvastatin (LIPITOR) 40 MG tablet Take 1 tablet (40 mg total) by mouth daily. 30 tablet 5  . carvedilol (COREG) 6.25 MG tablet Take 1 tablet (6.25 mg total) by mouth 2 (two) times daily. 60 tablet 6  . clopidogrel (PLAVIX) 75 MG tablet Take 1 tablet (75 mg total) by mouth daily. 30 tablet 3  . ENTRESTO 24-26 MG Take 1 tablet by mouth 2 (two) times daily.  0  . furosemide (LASIX) 80 MG tablet Take 80 mg by mouth 2 (two) times daily.  0  . meclizine (ANTIVERT) 25 MG tablet Take 1 tablet (25 mg total) by mouth 3 (three) times daily as needed  for dizziness. 30 tablet 0  . spironolactone (ALDACTONE) 25 MG tablet Take 1 tablet (25 mg total) by mouth daily. 30 tablet 6   No current facility-administered medications for this visit.     OBJECTIVE: There were no vitals filed for this visit.   There is no height or weight on file to calculate BMI.    ECOG FS:{CHL ONC [id number]  General: Well-developed, well-nourished, no acute distress. Eyes: Pink conjunctiva, anicteric sclera. HEENT: Normocephalic, moist mucous membranes, clear oropharnyx. Lungs: Clear to auscultation bilaterally. Heart: Regular rate and rhythm. No rubs, murmurs, or gallops. Abdomen: Soft, nontender, nondistended. No organomegaly noted, normoactive bowel sounds. Musculoskeletal: No edema, cyanosis, or clubbing. Neuro: Alert, answering all questions  appropriately. Cranial nerves grossly intact. Skin: No rashes or petechiae noted. Psych: Normal affect. Lymphatics: No cervical, calvicular, axillary or inguinal LAD.   LAB RESULTS:  Lab Results  Component Value Date   NA 141 05/04/2016   K 3.7 05/04/2016   CL 106 05/04/2016   CO2 26 05/04/2016   GLUCOSE 153 (H) 05/04/2016   BUN 18 05/04/2016   CREATININE 1.32 (H) 05/07/2016   CALCIUM 8.4 (L) 05/04/2016   PROT 7.1 05/03/2016   ALBUMIN 3.4 (L) 05/03/2016   AST 37 05/03/2016   ALT 21 05/03/2016   ALKPHOS 235 (H) 05/03/2016   BILITOT 1.8 (H) 05/03/2016   GFRNONAA 53 (L) 05/07/2016   GFRAA >60 05/07/2016    Lab Results  Component Value Date   WBC 66.2 (HH) 05/06/2016   NEUTROABS 59.7 (H) 05/02/2016   HGB 9.6 (L) 05/06/2016   HCT 30.1 (L) 05/06/2016   MCV 96.9 05/06/2016   PLT 836 (H) 05/06/2016     STUDIES: Dg Chest 2 View  Result Date: 05/02/2016 CLINICAL DATA:  Chest pain, shortness breath, and weakness for 1 week. EXAM: CHEST  2 VIEW COMPARISON:  04/29/2016 FINDINGS: Stable mild cardiomegaly. Mediastinal contours are within normal limits. Both lungs are clear. No evidence of pneumothorax or pleural effusion. Mild thoracic spine degenerative changes again noted. IMPRESSION: Stable mild cardiomegaly.  No active cardiopulmonary disease. Electronically Signed   By: Earle Gell M.D.   On: 05/02/2016 12:25   Dg Chest 2 View  Result Date: 04/29/2016 CLINICAL DATA:  Cough. EXAM: CHEST  2 VIEW COMPARISON:  04/04/2016 . FINDINGS: Mediastinum hilar structures normal. Cardiomegaly with mild pulmonary vascular prominence. No focal infiltrate. No pleural effusion or pneumothorax. IMPRESSION: Cardiomegaly with mild pulmonary vascular prominence. No focal infiltrate. Electronically Signed   By: Marcello Moores  Register   On: 04/29/2016 12:32   Ct Head Wo Contrast  Result Date: 05/02/2016 CLINICAL DATA:  Weakness.  History of leukemia. EXAM: CT HEAD WITHOUT CONTRAST TECHNIQUE: Contiguous  axial images were obtained from the base of the skull through the vertex without intravenous contrast. COMPARISON:  03/22/2016 multiple studies as distant as 09/11/2010. FINDINGS: Brain: Generalized brain atrophy. No evidence of old or acute focal infarction, mass lesion, hemorrhage, hydrocephalus or extra-axial collection. Vascular: There is atherosclerotic calcification of the major vessels at the base of the brain. Skull: No skull fracture Sinuses/Orbits: Old bilateral orbital blowout fractures affecting the lamina papyracea up. No active sinus disease. Other: Soft tissue scalp swelling in the right parietal region without underlying skull abnormality. This is a chronic finding. IMPRESSION: No acute intracranial finding.  Age related atrophy. Old orbital medial wall blowout fractures. Right parietal scalp swelling which is chronic. No underlying skull abnormality. Electronically Signed   By: Nelson Chimes M.D.   On: 05/02/2016 13:58  ASSESSMENT: CML  PLAN:    1. CML:  Patient expressed understanding and was in agreement with this plan. He also understands that He can call clinic at any time with any questions, concerns, or complaints.   Cancer Staging No matching staging information was found for the patient.  Lloyd Huger, MD   05/10/2016 9:56 PM

## 2016-05-11 ENCOUNTER — Telehealth: Payer: Self-pay | Admitting: Family

## 2016-05-11 ENCOUNTER — Ambulatory Visit: Payer: Medicare Other | Admitting: Family

## 2016-05-11 NOTE — Telephone Encounter (Signed)
Patient missed his initial appointment at the [facility] on 05/11/16. Will attempt to reschedule.

## 2016-05-12 ENCOUNTER — Inpatient Hospital Stay: Payer: Medicare Other | Admitting: Oncology

## 2016-05-31 NOTE — Progress Notes (Deleted)
[place]  [phone] [phone]  ID: Bruce [name] OB: [birth year]  MR#: [id number]  [id number]  Patient Care Team: [name], MD as PCP - General (Cardiology) Truitt Merle, MD as Consulting Physician (Hematology)  CHIEF COMPLAINT: CML  INTERVAL HISTORY: ***  REVIEW OF SYSTEMS:   ROS  As per HPI. Otherwise, a complete review of systems is negative.  PAST MEDICAL HISTORY: Past Medical History:  Diagnosis Date  . Bell's palsy   . Chronic combined systolic and diastolic CHF, NYHA class 3 ([place])   . CKD (chronic kidney disease), stage II   . CML (chronic myelocytic leukemia) ([place])   . Coronary artery disease, non-occlusive   . Hypercholesterolemia   . Hypertension   . Leukemia ([place])   . Stroke ([place])    No residual limb weakness.  Walks with cane at baseline.   Marland Kitchen TIA (transient ischemic attack) 05/10/2014    PAST SURGICAL HISTORY: Past Surgical History:  Procedure Laterality Date  . BACK SURGERY    . HIP ARTHROPLASTY Right    orif  . ORIF FOREARM FRACTURE Right     FAMILY HISTORY: Family History  Problem Relation Age of Onset  . Diabetes Mother   . Hypertension Mother   . Diabetes Father   . Hypertension Father   . Diabetes Brother   . Hypertension Brother   . Diabetes Sister   . Hypertension Sister   . Diabetes Brother   . Hypertension Brother   . Diabetes Sister   . Hypertension Sister     ADVANCED DIRECTIVES (Y/N):  N  HEALTH MAINTENANCE: Social History  Substance Use Topics  . Smoking status: Former Smoker    Packs/day: 0.50    Years: 50.00    Types: Cigarettes  . Smokeless tobacco: Never Used  . Alcohol use 0.6 oz/week    1 Cans of beer per week     Comment: daily      Colonoscopy:  PAP:  Bone density:  Lipid panel:  No Known Allergies  Current Outpatient Prescriptions  Medication Sig Dispense Refill  . acetaminophen (TYLENOL) 325 MG tablet Take 2 tablets (650 mg total) by mouth every  4 (four) hours as needed for headache or mild pain. 60 tablet 3  . albuterol (PROVENTIL HFA;VENTOLIN HFA) 108 (90 BASE) MCG/ACT inhaler Inhale 2 puffs into the lungs every 4 (four) hours as needed for wheezing or shortness of breath. 1 Inhaler 3  . ALPRAZolam (XANAX) 0.25 MG tablet Take 0.25 mg by mouth 2 (two) times daily as needed for anxiety.    Marland Kitchen amiodarone (PACERONE) 200 MG tablet Take 1 tablet (200 mg total) by mouth daily. (Patient not taking: Reported on 05/02/2016) 30 tablet 5  . aspirin 81 MG chewable tablet Chew 1 tablet (81 mg total) by mouth daily. 30 tablet 0  . atorvastatin (LIPITOR) 40 MG tablet Take 1 tablet (40 mg total) by mouth daily. 30 tablet 5  . carvedilol (COREG) 6.25 MG tablet Take 1 tablet (6.25 mg total) by mouth 2 (two) times daily. 60 tablet 6  . clopidogrel (PLAVIX) 75 MG tablet Take 1 tablet (75 mg total) by mouth daily. 30 tablet 3  . ENTRESTO 24-26 MG Take 1 tablet by mouth 2 (two) times daily.  0  . furosemide (LASIX) 80 MG tablet Take 80 mg by mouth 2 (two) times daily.  0  . meclizine (ANTIVERT) 25 MG tablet Take 1 tablet (25 mg total) by mouth 3 (three) times daily as needed  for dizziness. 30 tablet 0  . spironolactone (ALDACTONE) 25 MG tablet Take 1 tablet (25 mg total) by mouth daily. 30 tablet 6   No current facility-administered medications for this visit.     OBJECTIVE: There were no vitals filed for this visit.   There is no height or weight on file to calculate BMI.    ECOG FS:{CHL ONC [id number]  General: Well-developed, well-nourished, no acute distress. Eyes: Pink conjunctiva, anicteric sclera. HEENT: Normocephalic, moist mucous membranes, clear oropharnyx. Lungs: Clear to auscultation bilaterally. Heart: Regular rate and rhythm. No rubs, murmurs, or gallops. Abdomen: Soft, nontender, nondistended. No organomegaly noted, normoactive bowel sounds. Musculoskeletal: No edema, cyanosis, or clubbing. Neuro: Alert, answering all questions  appropriately. Cranial nerves grossly intact. Skin: No rashes or petechiae noted. Psych: Normal affect. Lymphatics: No cervical, calvicular, axillary or inguinal LAD.   LAB RESULTS:  Lab Results  Component Value Date   NA 141 05/04/2016   K 3.7 05/04/2016   CL 106 05/04/2016   CO2 26 05/04/2016   GLUCOSE 153 (H) 05/04/2016   BUN 18 05/04/2016   CREATININE 1.32 (H) 05/07/2016   CALCIUM 8.4 (L) 05/04/2016   PROT 7.1 05/03/2016   ALBUMIN 3.4 (L) 05/03/2016   AST 37 05/03/2016   ALT 21 05/03/2016   ALKPHOS 235 (H) 05/03/2016   BILITOT 1.8 (H) 05/03/2016   GFRNONAA 53 (L) 05/07/2016   GFRAA >60 05/07/2016    Lab Results  Component Value Date   WBC 66.2 (HH) 05/06/2016   NEUTROABS 59.7 (H) 05/02/2016   HGB 9.6 (L) 05/06/2016   HCT 30.1 (L) 05/06/2016   MCV 96.9 05/06/2016   PLT 836 (H) 05/06/2016     STUDIES: Dg Chest 2 View  Result Date: 05/02/2016 CLINICAL DATA:  Chest pain, shortness breath, and weakness for 1 week. EXAM: CHEST  2 VIEW COMPARISON:  04/29/2016 FINDINGS: Stable mild cardiomegaly. Mediastinal contours are within normal limits. Both lungs are clear. No evidence of pneumothorax or pleural effusion. Mild thoracic spine degenerative changes again noted. IMPRESSION: Stable mild cardiomegaly.  No active cardiopulmonary disease. Electronically Signed   By: Earle Gell M.D.   On: 05/02/2016 12:25   Ct Head Wo Contrast  Result Date: 05/02/2016 CLINICAL DATA:  Weakness.  History of leukemia. EXAM: CT HEAD WITHOUT CONTRAST TECHNIQUE: Contiguous axial images were obtained from the base of the skull through the vertex without intravenous contrast. COMPARISON:  03/22/2016 multiple studies as distant as 09/11/2010. FINDINGS: Brain: Generalized brain atrophy. No evidence of old or acute focal infarction, mass lesion, hemorrhage, hydrocephalus or extra-axial collection. Vascular: There is atherosclerotic calcification of the major vessels at the base of the brain. Skull: No  skull fracture Sinuses/Orbits: Old bilateral orbital blowout fractures affecting the lamina papyracea up. No active sinus disease. Other: Soft tissue scalp swelling in the right parietal region without underlying skull abnormality. This is a chronic finding. IMPRESSION: No acute intracranial finding.  Age related atrophy. Old orbital medial wall blowout fractures. Right parietal scalp swelling which is chronic. No underlying skull abnormality. Electronically Signed   By: Nelson Chimes M.D.   On: 05/02/2016 13:58    ASSESSMENT: CML  PLAN:    1. CML:  Patient expressed understanding and was in agreement with this plan. He also understands that He can call clinic at any time with any questions, concerns, or complaints.   Cancer Staging No matching staging information was found for the patient.  Lloyd Huger, MD   05/31/2016 5:39 PM

## 2016-06-01 ENCOUNTER — Inpatient Hospital Stay: Payer: Medicare Other | Admitting: Oncology

## 2016-06-07 IMAGING — CR DG CHEST 2V
2 series · 2 of 2 positions shown · non-contrast
Comparison: 09/18/2014

CLINICAL DATA: Shortness of breath, dizziness, cough, and
congestion for 1-2 weeks. Former smoker. History of leukemia.

EXAM:
CHEST  2 VIEW

[w chest pa]
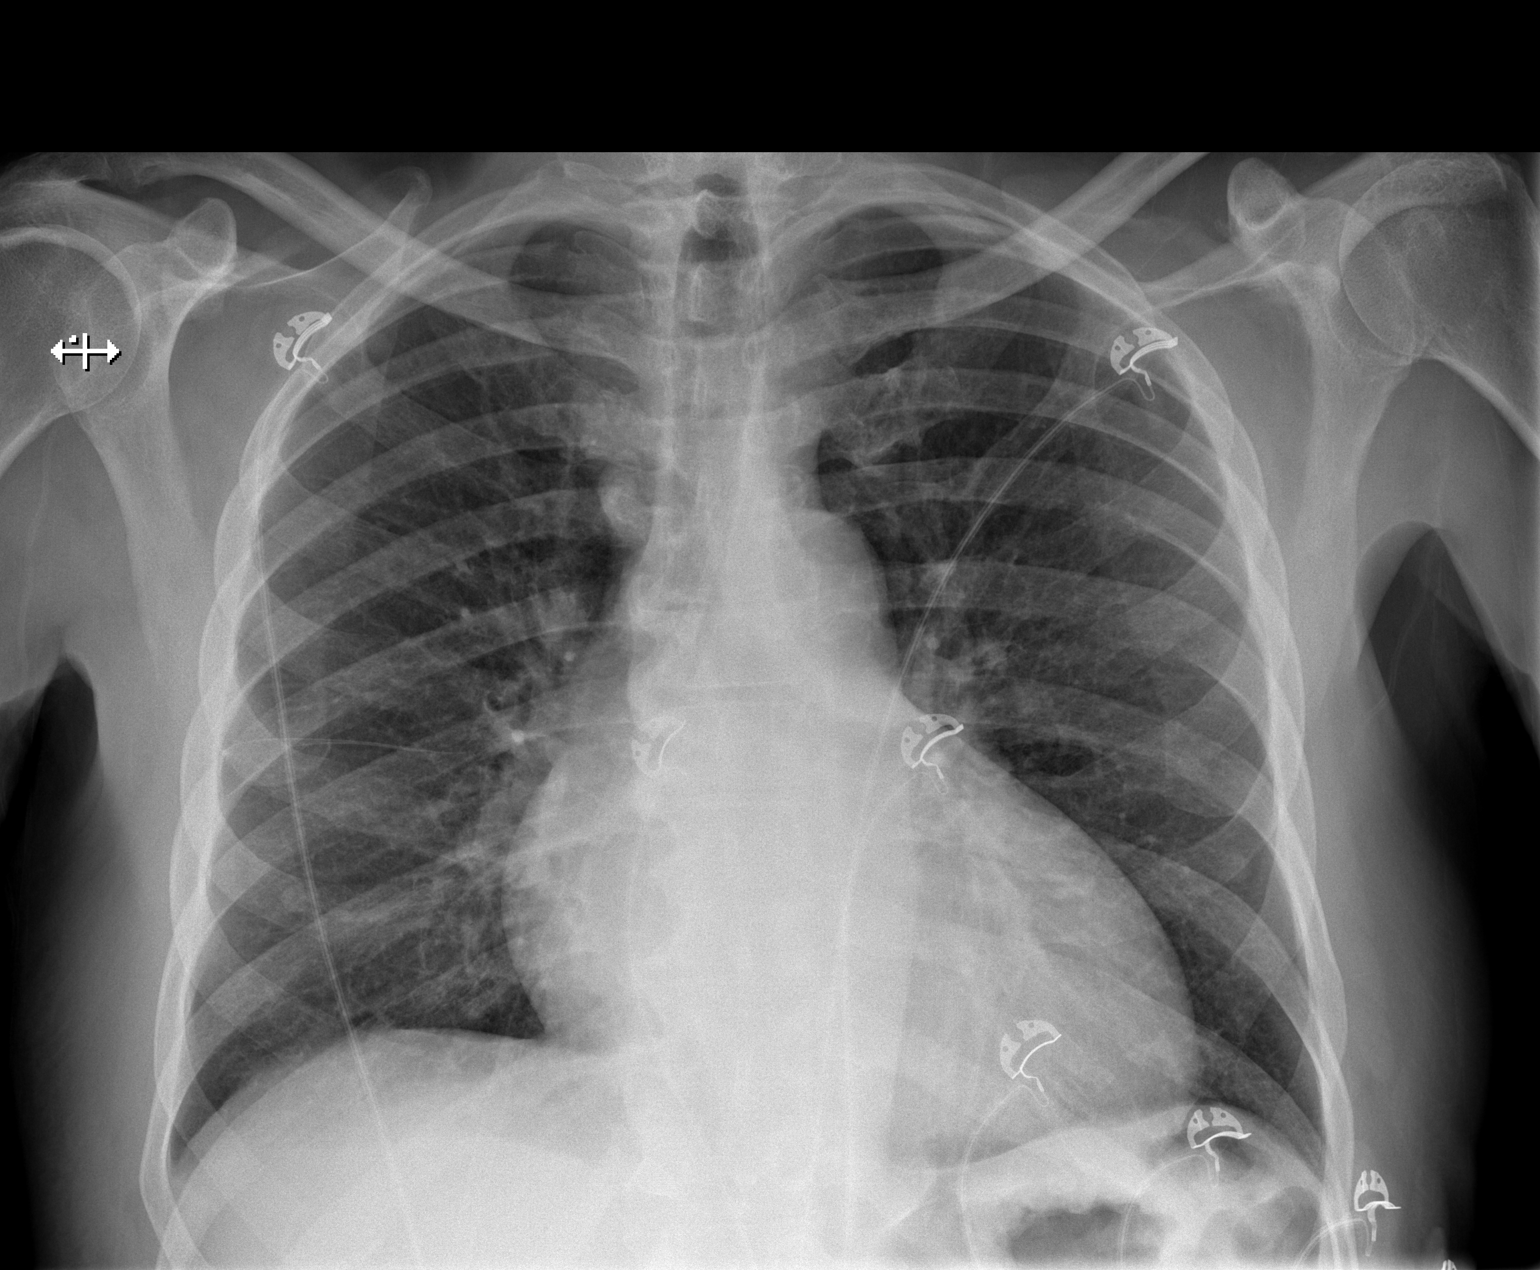

[w chest lat]
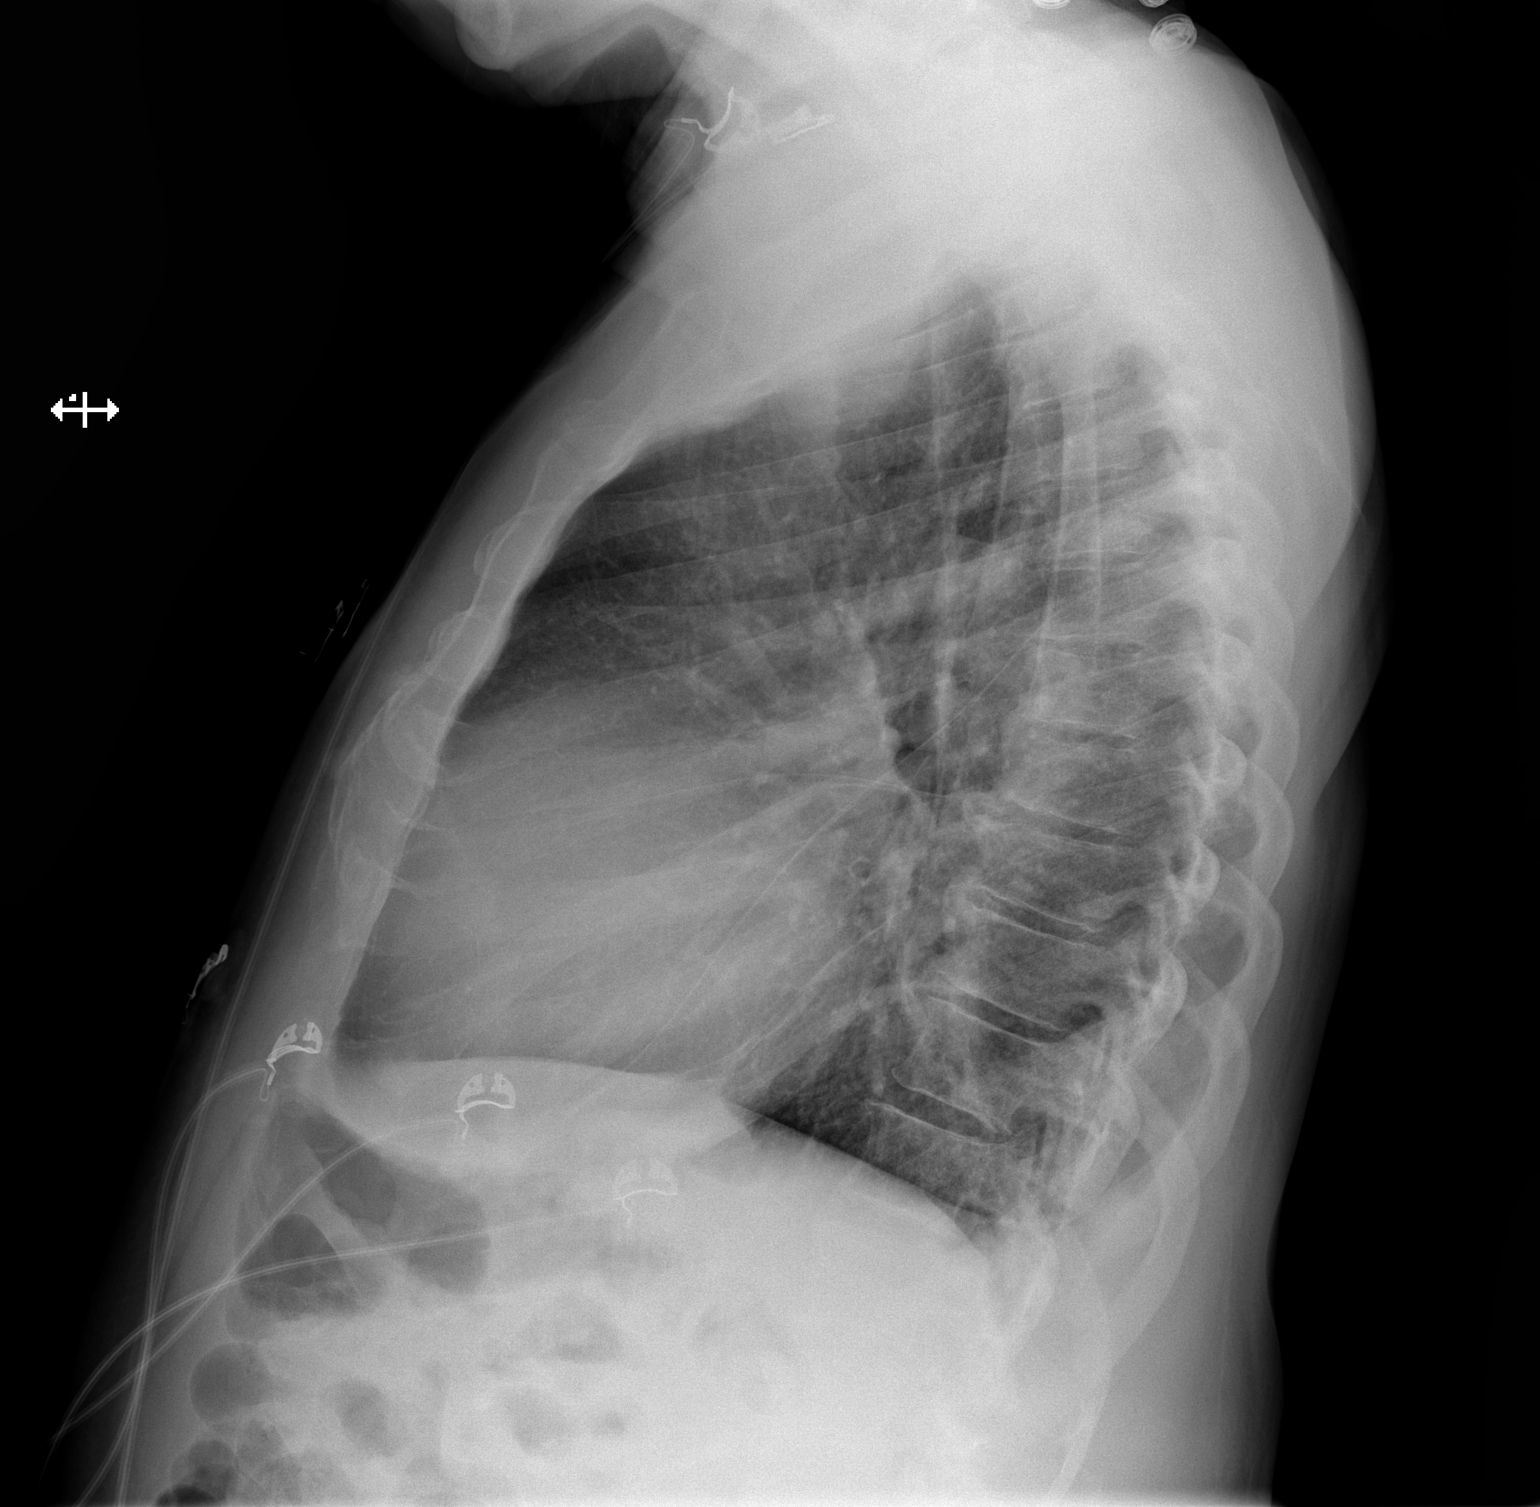

[2 of 2 positions shown; findings below may reference images not displayed]

FINDINGS: Mild cardiac enlargement with normal pulmonary vascularity. No focal
airspace disease or consolidation in the lungs. No blunting of
costophrenic angles. No pneumothorax. Vague nodular opacities over
the mid lungs likely representing prominent nipple shadows.
Mediastinal contours appear intact. Degenerative changes in the
spine and shoulders.
IMPRESSION: No active cardiopulmonary disease.

## 2016-07-21 ENCOUNTER — Inpatient Hospital Stay
Admission: EM | Admit: 2016-07-21 | Discharge: 2016-08-04 | DRG: 291 | Disposition: A | Discharge status: Skilled Nursing Facility | Payer: Medicare Other | Attending: Internal Medicine | Admitting: Internal Medicine

## 2016-07-21 ENCOUNTER — Encounter: Payer: Self-pay | Admitting: Emergency Medicine

## 2016-07-21 ENCOUNTER — Emergency Department: Payer: Medicare Other

## 2016-07-21 DIAGNOSIS — R7301 Impaired fasting glucose: Secondary | ICD-10-CM | POA: Diagnosis not present

## 2016-07-21 DIAGNOSIS — J441 Chronic obstructive pulmonary disease with (acute) exacerbation: Secondary | ICD-10-CM | POA: Diagnosis present

## 2016-07-21 DIAGNOSIS — I509 Heart failure, unspecified: Secondary | ICD-10-CM

## 2016-07-21 DIAGNOSIS — I429 Cardiomyopathy, unspecified: Secondary | ICD-10-CM | POA: Diagnosis present

## 2016-07-21 DIAGNOSIS — I428 Other cardiomyopathies: Secondary | ICD-10-CM

## 2016-07-21 DIAGNOSIS — Z833 Family history of diabetes mellitus: Secondary | ICD-10-CM

## 2016-07-21 DIAGNOSIS — Z8249 Family history of ischemic heart disease and other diseases of the circulatory system: Secondary | ICD-10-CM | POA: Diagnosis not present

## 2016-07-21 DIAGNOSIS — E872 Acidosis: Secondary | ICD-10-CM | POA: Diagnosis not present

## 2016-07-21 DIAGNOSIS — I5043 Acute on chronic combined systolic (congestive) and diastolic (congestive) heart failure: Secondary | ICD-10-CM | POA: Diagnosis present

## 2016-07-21 DIAGNOSIS — F4323 Adjustment disorder with mixed anxiety and depressed mood: Secondary | ICD-10-CM | POA: Diagnosis present

## 2016-07-21 DIAGNOSIS — E7800 Pure hypercholesterolemia, unspecified: Secondary | ICD-10-CM | POA: Diagnosis not present

## 2016-07-21 DIAGNOSIS — Z7984 Long term (current) use of oral hypoglycemic drugs: Secondary | ICD-10-CM

## 2016-07-21 DIAGNOSIS — I10 Essential (primary) hypertension: Secondary | ICD-10-CM | POA: Diagnosis not present

## 2016-07-21 DIAGNOSIS — N179 Acute kidney failure, unspecified: Secondary | ICD-10-CM | POA: Diagnosis not present

## 2016-07-21 DIAGNOSIS — Z5900 Homelessness unspecified: Secondary | ICD-10-CM

## 2016-07-21 DIAGNOSIS — I132 Hypertensive heart and chronic kidney disease with heart failure and with stage 5 chronic kidney disease, or end stage renal disease: Secondary | ICD-10-CM | POA: Diagnosis present

## 2016-07-21 DIAGNOSIS — Z66 Do not resuscitate: Secondary | ICD-10-CM | POA: Diagnosis not present

## 2016-07-21 DIAGNOSIS — N182 Chronic kidney disease, stage 2 (mild): Secondary | ICD-10-CM | POA: Diagnosis not present

## 2016-07-21 DIAGNOSIS — J40 Bronchitis, not specified as acute or chronic: Secondary | ICD-10-CM | POA: Diagnosis not present

## 2016-07-21 DIAGNOSIS — Z515 Encounter for palliative care: Secondary | ICD-10-CM | POA: Diagnosis not present

## 2016-07-21 DIAGNOSIS — Z79899 Other long term (current) drug therapy: Secondary | ICD-10-CM | POA: Diagnosis not present

## 2016-07-21 DIAGNOSIS — I2510 Atherosclerotic heart disease of native coronary artery without angina pectoris: Secondary | ICD-10-CM | POA: Diagnosis not present

## 2016-07-21 DIAGNOSIS — Z87891 Personal history of nicotine dependence: Secondary | ICD-10-CM | POA: Diagnosis not present

## 2016-07-21 DIAGNOSIS — Z7189 Other specified counseling: Secondary | ICD-10-CM

## 2016-07-21 DIAGNOSIS — Z825 Family history of asthma and other chronic lower respiratory diseases: Secondary | ICD-10-CM | POA: Diagnosis not present

## 2016-07-21 DIAGNOSIS — D649 Anemia, unspecified: Secondary | ICD-10-CM

## 2016-07-21 DIAGNOSIS — I951 Orthostatic hypotension: Secondary | ICD-10-CM | POA: Diagnosis not present

## 2016-07-21 DIAGNOSIS — C9210 Chronic myeloid leukemia, BCR/ABL-positive, not having achieved remission: Secondary | ICD-10-CM | POA: Diagnosis present

## 2016-07-21 DIAGNOSIS — K921 Melena: Secondary | ICD-10-CM | POA: Diagnosis not present

## 2016-07-21 DIAGNOSIS — R05 Cough: Secondary | ICD-10-CM

## 2016-07-21 DIAGNOSIS — I5022 Chronic systolic (congestive) heart failure: Secondary | ICD-10-CM | POA: Diagnosis not present

## 2016-07-21 DIAGNOSIS — Z856 Personal history of leukemia: Secondary | ICD-10-CM | POA: Diagnosis not present

## 2016-07-21 DIAGNOSIS — T380X5A Adverse effect of glucocorticoids and synthetic analogues, initial encounter: Secondary | ICD-10-CM | POA: Diagnosis not present

## 2016-07-21 DIAGNOSIS — Z91199 Patient's noncompliance with other medical treatment and regimen due to unspecified reason: Secondary | ICD-10-CM

## 2016-07-21 DIAGNOSIS — Z96641 Presence of right artificial hip joint: Secondary | ICD-10-CM | POA: Diagnosis present

## 2016-07-21 DIAGNOSIS — I5023 Acute on chronic systolic (congestive) heart failure: Secondary | ICD-10-CM | POA: Diagnosis not present

## 2016-07-21 DIAGNOSIS — E875 Hyperkalemia: Secondary | ICD-10-CM | POA: Diagnosis not present

## 2016-07-21 DIAGNOSIS — I248 Other forms of acute ischemic heart disease: Secondary | ICD-10-CM | POA: Diagnosis present

## 2016-07-21 DIAGNOSIS — I471 Supraventricular tachycardia: Secondary | ICD-10-CM | POA: Diagnosis not present

## 2016-07-21 DIAGNOSIS — Z8673 Personal history of transient ischemic attack (TIA), and cerebral infarction without residual deficits: Secondary | ICD-10-CM | POA: Diagnosis not present

## 2016-07-21 DIAGNOSIS — Z7982 Long term (current) use of aspirin: Secondary | ICD-10-CM

## 2016-07-21 DIAGNOSIS — T502X5A Adverse effect of carbonic-anhydrase inhibitors, benzothiadiazides and other diuretics, initial encounter: Secondary | ICD-10-CM | POA: Diagnosis not present

## 2016-07-21 DIAGNOSIS — I272 Pulmonary hypertension, unspecified: Secondary | ICD-10-CM | POA: Diagnosis present

## 2016-07-21 DIAGNOSIS — N189 Chronic kidney disease, unspecified: Secondary | ICD-10-CM | POA: Diagnosis not present

## 2016-07-21 DIAGNOSIS — I5020 Unspecified systolic (congestive) heart failure: Secondary | ICD-10-CM

## 2016-07-21 DIAGNOSIS — D631 Anemia in chronic kidney disease: Secondary | ICD-10-CM | POA: Diagnosis present

## 2016-07-21 DIAGNOSIS — Z7902 Long term (current) use of antithrombotics/antiplatelets: Secondary | ICD-10-CM

## 2016-07-21 DIAGNOSIS — R0602 Shortness of breath: Secondary | ICD-10-CM | POA: Diagnosis not present

## 2016-07-21 DIAGNOSIS — E785 Hyperlipidemia, unspecified: Secondary | ICD-10-CM | POA: Diagnosis present

## 2016-07-21 DIAGNOSIS — R778 Other specified abnormalities of plasma proteins: Secondary | ICD-10-CM

## 2016-07-21 DIAGNOSIS — R0603 Acute respiratory distress: Secondary | ICD-10-CM | POA: Diagnosis present

## 2016-07-21 DIAGNOSIS — N186 End stage renal disease: Secondary | ICD-10-CM | POA: Diagnosis present

## 2016-07-21 DIAGNOSIS — I472 Ventricular tachycardia: Secondary | ICD-10-CM | POA: Diagnosis not present

## 2016-07-21 DIAGNOSIS — Z95828 Presence of other vascular implants and grafts: Secondary | ICD-10-CM

## 2016-07-21 DIAGNOSIS — E876 Hypokalemia: Secondary | ICD-10-CM | POA: Diagnosis present

## 2016-07-21 DIAGNOSIS — Z9119 Patient's noncompliance with other medical treatment and regimen: Secondary | ICD-10-CM

## 2016-07-21 DIAGNOSIS — R7989 Other specified abnormal findings of blood chemistry: Secondary | ICD-10-CM

## 2016-07-21 LAB — URINE DRUG SCREEN, QUALITATIVE (ARMC ONLY)
Amphetamines, Ur Screen: NOT DETECTED
Barbiturates, Ur Screen: NOT DETECTED
Benzodiazepine, Ur Scrn: NOT DETECTED
COCAINE METABOLITE, UR ~~LOC~~: NOT DETECTED
Cannabinoid 50 Ng, Ur ~~LOC~~: NOT DETECTED
MDMA (Ecstasy)Ur Screen: NOT DETECTED
METHADONE SCREEN, URINE: NOT DETECTED
Opiate, Ur Screen: NOT DETECTED
Phencyclidine (PCP) Ur S: NOT DETECTED
TRICYCLIC, UR SCREEN: NOT DETECTED

## 2016-07-21 LAB — COMPREHENSIVE METABOLIC PANEL
ALBUMIN: 3.9 g/dL (ref 3.5–5.0)
ALT: 20 U/L (ref 17–63)
ANION GAP: 11 (ref 5–15)
AST: 34 U/L (ref 15–41)
Alkaline Phosphatase: 207 U/L — ABNORMAL HIGH (ref 38–126)
BILIRUBIN TOTAL: 2.6 mg/dL — AB (ref 0.3–1.2)
BUN: 20 mg/dL (ref 6–20)
CHLORIDE: 104 mmol/L (ref 101–111)
CO2: 25 mmol/L (ref 22–32)
Calcium: 9 mg/dL (ref 8.9–10.3)
Creatinine, Ser: 1.32 mg/dL — ABNORMAL HIGH (ref 0.61–1.24)
GFR calc Af Amer: >60 mL/min (ref >60)
GFR calc non Af Amer: 53 mL/min — ABNORMAL LOW (ref >60)
GLUCOSE: 208 mg/dL — AB (ref 65–99)
POTASSIUM: 3.3 mmol/L — AB (ref 3.5–5.1)
Sodium: 140 mmol/L (ref 135–145)
TOTAL PROTEIN: 7.7 g/dL (ref 6.5–8.1)

## 2016-07-21 LAB — CBC WITH DIFFERENTIAL/PLATELET
BASOS ABS: 0.2 10*3/uL — AB (ref 0–0.1)
BASOS PCT: 1 %
EOS PCT: 1 %
Eosinophils Absolute: 0.1 10*3/uL (ref 0–0.7)
HEMATOCRIT: 33.6 % — AB (ref 40.0–52.0)
Hemoglobin: 10.5 g/dL — ABNORMAL LOW (ref 13.0–18.0)
LYMPHS PCT: 6 %
Lymphs Abs: 1.2 10*3/uL (ref 1.0–3.6)
MCH: 29.6 pg (ref 26.0–34.0)
MCHC: 31.4 g/dL — ABNORMAL LOW (ref 32.0–36.0)
MCV: 94.3 fL (ref 80.0–100.0)
MONO ABS: 1.8 10*3/uL — AB (ref 0.2–1.0)
Monocytes Relative: 8 %
NEUTROS ABS: 17.6 10*3/uL — AB (ref 1.4–6.5)
Neutrophils Relative %: 84 %
PLATELETS: 854 10*3/uL — AB (ref 150–440)
RBC: 3.56 MIL/uL — AB (ref 4.40–5.90)
RDW: 20.9 % — AB (ref 11.5–14.5)
WBC: 20.9 10*3/uL — AB (ref 3.8–10.6)

## 2016-07-21 LAB — MAGNESIUM: MAGNESIUM: 2.1 mg/dL (ref 1.7–2.4)

## 2016-07-21 LAB — GLUCOSE, CAPILLARY
GLUCOSE-CAPILLARY: 169 mg/dL — AB (ref 65–99)
Glucose-Capillary: 264 mg/dL — ABNORMAL HIGH (ref 65–99)
Glucose-Capillary: 224 mg/dL — ABNORMAL HIGH (ref 65–99)

## 2016-07-21 LAB — TROPONIN I
TROPONIN I: 0.1 ng/mL — AB (ref <0.03)
TROPONIN I: 0.09 ng/mL — AB (ref <0.03)
Troponin I: 0.07 ng/mL (ref <0.03)

## 2016-07-21 LAB — BRAIN NATRIURETIC PEPTIDE: B Natriuretic Peptide: 3630 pg/mL — ABNORMAL HIGH (ref 0.0–100.0)

## 2016-07-21 LAB — ETHANOL

## 2016-07-21 LAB — INFLUENZA PANEL BY PCR (TYPE A & B)
INFLBPCR: NEGATIVE
Influenza A By PCR: NEGATIVE

## 2016-07-21 MED ORDER — SENNOSIDES-DOCUSATE SODIUM 8.6-50 MG PO TABS
1.0000 | ORAL_TABLET | Freq: Every evening | ORAL | Status: DC | PRN
  Administered 2016-08-01 (×2): 1 via ORAL
  Filled 2016-07-21 (×2): qty 1

## 2016-07-21 MED ORDER — ALBUTEROL SULFATE (2.5 MG/3ML) 0.083% IN NEBU
5.0000 mg | INHALATION_SOLUTION | Freq: Once | RESPIRATORY_TRACT | Status: AC
  Administered 2016-07-21: 5 mg via RESPIRATORY_TRACT
  Filled 2016-07-21: qty 6

## 2016-07-21 MED ORDER — CLOPIDOGREL BISULFATE 75 MG PO TABS
75.0000 mg | ORAL_TABLET | Freq: Every day | ORAL | Status: DC
  Administered 2016-07-21: 75 mg via ORAL
  Filled 2016-07-21: qty 1

## 2016-07-21 MED ORDER — SODIUM CHLORIDE 0.9 % IV SOLN: 250.0000 mL | INTRAVENOUS | Status: DC | PRN

## 2016-07-21 MED ORDER — ENOXAPARIN SODIUM 40 MG/0.4ML ~~LOC~~ SOLN
40.0000 mg | SUBCUTANEOUS | Status: DC
  Administered 2016-07-21 – 2016-07-22 (×2): 40 mg via SUBCUTANEOUS
  Filled 2016-07-21 (×2): qty 0.4

## 2016-07-21 MED ORDER — BOSUTINIB 100 MG PO TABS
300.0000 mg | ORAL_TABLET | Freq: Every day | ORAL | Status: DC
Start: 2016-07-21 — End: 2016-07-27
  Administered 2016-07-21: 300 mg via ORAL
  Filled 2016-07-21 (×4): qty 3

## 2016-07-21 MED ORDER — METHYLPREDNISOLONE SODIUM SUCC 125 MG IJ SOLR
125.0000 mg | Freq: Once | INTRAMUSCULAR | Status: AC
  Administered 2016-07-21: 125 mg via INTRAVENOUS
  Filled 2016-07-21: qty 2

## 2016-07-21 MED ORDER — FUROSEMIDE 10 MG/ML IJ SOLN
40.0000 mg | Freq: Once | INTRAMUSCULAR | Status: AC
  Administered 2016-07-21: 40 mg via INTRAVENOUS
  Filled 2016-07-21: qty 4

## 2016-07-21 MED ORDER — ASPIRIN 81 MG PO CHEW
81.0000 mg | CHEWABLE_TABLET | Freq: Every day | ORAL | Status: DC
  Administered 2016-07-22 – 2016-08-01 (×10): 81 mg via ORAL
  Filled 2016-07-21 (×12): qty 1

## 2016-07-21 MED ORDER — GUAIFENESIN-DM 100-10 MG/5ML PO SYRP
5.0000 mL | ORAL_SOLUTION | ORAL | Status: DC | PRN
  Administered 2016-07-21 – 2016-07-22 (×3): 5 mL via ORAL
  Filled 2016-07-21 (×3): qty 5

## 2016-07-21 MED ORDER — MECLIZINE HCL 25 MG PO TABS: 25.0000 mg | ORAL_TABLET | Freq: Three times a day (TID) | ORAL | Status: DC | PRN

## 2016-07-21 MED ORDER — INSULIN ASPART 100 UNIT/ML ~~LOC~~ SOLN
0.0000 [IU] | Freq: Three times a day (TID) | SUBCUTANEOUS | Status: DC
  Administered 2016-07-21: 5 [IU] via SUBCUTANEOUS
  Administered 2016-07-22: 3 [IU] via SUBCUTANEOUS
  Administered 2016-07-22: 1 [IU] via SUBCUTANEOUS
  Administered 2016-07-22: 3 [IU] via SUBCUTANEOUS
  Administered 2016-07-23: 2 [IU] via SUBCUTANEOUS
  Administered 2016-07-23: 3 [IU] via SUBCUTANEOUS
  Administered 2016-07-23: 5 [IU] via SUBCUTANEOUS
  Administered 2016-07-24: 3 [IU] via SUBCUTANEOUS
  Administered 2016-07-24: 1 [IU] via SUBCUTANEOUS
  Administered 2016-07-24: 3 [IU] via SUBCUTANEOUS
  Administered 2016-07-25: 7 [IU] via SUBCUTANEOUS
  Administered 2016-07-25: 3 [IU] via SUBCUTANEOUS
  Administered 2016-07-25: 9 [IU] via SUBCUTANEOUS
  Administered 2016-07-26: 5 [IU] via SUBCUTANEOUS
  Administered 2016-07-26: 3 [IU] via SUBCUTANEOUS
  Administered 2016-07-26: 7 [IU] via SUBCUTANEOUS
  Administered 2016-07-27 (×2): 3 [IU] via SUBCUTANEOUS
  Administered 2016-07-27: 5 [IU] via SUBCUTANEOUS
  Administered 2016-07-28: 2 [IU] via SUBCUTANEOUS
  Administered 2016-07-28: 1 [IU] via SUBCUTANEOUS
  Administered 2016-07-28: 2 [IU] via SUBCUTANEOUS
  Administered 2016-07-30: 5 [IU] via SUBCUTANEOUS
  Administered 2016-07-30 (×2): 3 [IU] via SUBCUTANEOUS
  Administered 2016-07-31: 9 [IU] via SUBCUTANEOUS
  Administered 2016-07-31: 7 [IU] via SUBCUTANEOUS
  Administered 2016-08-01 (×3): 9 [IU] via SUBCUTANEOUS
  Administered 2016-08-02: 3 [IU] via SUBCUTANEOUS
  Administered 2016-08-02: 2 [IU] via SUBCUTANEOUS
  Administered 2016-08-02: 3 [IU] via SUBCUTANEOUS
  Administered 2016-08-03: 9 [IU] via SUBCUTANEOUS
  Administered 2016-08-03: 1 [IU] via SUBCUTANEOUS
  Administered 2016-08-03: 3 [IU] via SUBCUTANEOUS
  Administered 2016-08-04: 2 [IU] via SUBCUTANEOUS
  Filled 2016-07-21: qty 3
  Filled 2016-07-21: qty 9
  Filled 2016-07-21: qty 5
  Filled 2016-07-21 (×2): qty 3
  Filled 2016-07-21: qty 9
  Filled 2016-07-21: qty 7
  Filled 2016-07-21 (×2): qty 3
  Filled 2016-07-21 (×3): qty 9
  Filled 2016-07-21 (×2): qty 3
  Filled 2016-07-21: qty 5
  Filled 2016-07-21: qty 2
  Filled 2016-07-21: qty 5
  Filled 2016-07-21: qty 3
  Filled 2016-07-21: qty 7
  Filled 2016-07-21: qty 1
  Filled 2016-07-21: qty 5
  Filled 2016-07-21 (×3): qty 3
  Filled 2016-07-21: qty 2
  Filled 2016-07-21: qty 1
  Filled 2016-07-21: qty 7
  Filled 2016-07-21: qty 9
  Filled 2016-07-21: qty 2
  Filled 2016-07-21: qty 1
  Filled 2016-07-21: qty 3
  Filled 2016-07-21: qty 5
  Filled 2016-07-21 (×2): qty 2
  Filled 2016-07-21: qty 3

## 2016-07-21 MED ORDER — SODIUM CHLORIDE 0.9% FLUSH
3.0000 mL | Freq: Two times a day (BID) | INTRAVENOUS | Status: DC
  Administered 2016-07-22 – 2016-08-03 (×18): 3 mL via INTRAVENOUS

## 2016-07-21 MED ORDER — ONDANSETRON HCL 4 MG/2ML IJ SOLN
4.0000 mg | Freq: Four times a day (QID) | INTRAMUSCULAR | Status: DC | PRN
  Administered 2016-07-23 – 2016-07-24 (×3): 4 mg via INTRAVENOUS
  Filled 2016-07-21 (×3): qty 2

## 2016-07-21 MED ORDER — TIOTROPIUM BROMIDE MONOHYDRATE 18 MCG IN CAPS
18.0000 ug | ORAL_CAPSULE | Freq: Every morning | RESPIRATORY_TRACT | Status: DC
  Administered 2016-07-21 – 2016-08-04 (×15): 18 ug via RESPIRATORY_TRACT
  Filled 2016-07-21 (×4): qty 5

## 2016-07-21 MED ORDER — AMIODARONE HCL 200 MG PO TABS
200.0000 mg | ORAL_TABLET | Freq: Every day | ORAL | Status: DC
  Administered 2016-07-21 – 2016-07-28 (×8): 200 mg via ORAL
  Filled 2016-07-21 (×8): qty 1

## 2016-07-21 MED ORDER — SODIUM CHLORIDE 0.9% FLUSH
3.0000 mL | Freq: Two times a day (BID) | INTRAVENOUS | Status: DC
  Administered 2016-07-21 – 2016-08-03 (×23): 3 mL via INTRAVENOUS

## 2016-07-21 MED ORDER — FUROSEMIDE 10 MG/ML IJ SOLN
40.0000 mg | Freq: Two times a day (BID) | INTRAMUSCULAR | Status: DC
  Administered 2016-07-21 – 2016-07-22 (×2): 40 mg via INTRAVENOUS
  Filled 2016-07-21 (×2): qty 4

## 2016-07-21 MED ORDER — SPIRONOLACTONE 25 MG PO TABS
25.0000 mg | ORAL_TABLET | Freq: Every day | ORAL | Status: DC
  Administered 2016-07-21 – 2016-07-22 (×2): 25 mg via ORAL
  Filled 2016-07-21 (×2): qty 1

## 2016-07-21 MED ORDER — METOPROLOL SUCCINATE ER 25 MG PO TB24
25.0000 mg | ORAL_TABLET | Freq: Every day | ORAL | Status: DC
  Administered 2016-07-21 – 2016-07-31 (×11): 25 mg via ORAL
  Filled 2016-07-21 (×12): qty 1

## 2016-07-21 MED ORDER — SODIUM CHLORIDE 0.9% FLUSH: 3.0000 mL | INTRAVENOUS | Status: DC | PRN

## 2016-07-21 MED ORDER — POTASSIUM CHLORIDE CRYS ER 20 MEQ PO TBCR
40.0000 meq | EXTENDED_RELEASE_TABLET | Freq: Once | ORAL | Status: AC
  Administered 2016-07-21: 40 meq via ORAL
  Filled 2016-07-21: qty 2

## 2016-07-21 MED ORDER — ONDANSETRON HCL 4 MG PO TABS
4.0000 mg | ORAL_TABLET | Freq: Four times a day (QID) | ORAL | Status: DC | PRN
  Administered 2016-07-22: 4 mg via ORAL
  Filled 2016-07-21: qty 1

## 2016-07-21 MED ORDER — ACETAMINOPHEN 650 MG RE SUPP
650.0000 mg | Freq: Four times a day (QID) | RECTAL | Status: DC | PRN
Start: 2016-07-21 — End: 2016-08-04

## 2016-07-21 MED ORDER — ALBUTEROL SULFATE (2.5 MG/3ML) 0.083% IN NEBU
2.5000 mg | INHALATION_SOLUTION | RESPIRATORY_TRACT | Status: DC | PRN
  Administered 2016-07-21 – 2016-07-23 (×2): 2.5 mg via RESPIRATORY_TRACT
  Filled 2016-07-21 (×2): qty 3

## 2016-07-21 MED ORDER — ATORVASTATIN CALCIUM 20 MG PO TABS
40.0000 mg | ORAL_TABLET | Freq: Every day | ORAL | Status: DC
Start: 2016-07-21 — End: 2016-08-03
  Administered 2016-07-21 – 2016-08-03 (×13): 40 mg via ORAL
  Filled 2016-07-21 (×14): qty 2

## 2016-07-21 MED ORDER — BISACODYL 5 MG PO TBEC
5.0000 mg | DELAYED_RELEASE_TABLET | Freq: Every day | ORAL | Status: DC | PRN
Start: 2016-07-21 — End: 2016-08-04
  Administered 2016-08-01: 5 mg via ORAL
  Filled 2016-07-21: qty 1

## 2016-07-21 MED ORDER — INSULIN ASPART 100 UNIT/ML ~~LOC~~ SOLN
0.0000 [IU] | Freq: Every day | SUBCUTANEOUS | Status: DC
  Administered 2016-07-21 – 2016-07-22 (×2): 2 [IU] via SUBCUTANEOUS
  Administered 2016-07-23: 3 [IU] via SUBCUTANEOUS
  Administered 2016-07-25: 5 [IU] via SUBCUTANEOUS
  Administered 2016-07-26: 2 [IU] via SUBCUTANEOUS
  Administered 2016-07-30 – 2016-07-31 (×2): 4 [IU] via SUBCUTANEOUS
  Administered 2016-08-01: 3 [IU] via SUBCUTANEOUS
  Administered 2016-08-03: 4 [IU] via SUBCUTANEOUS
  Filled 2016-07-21: qty 5
  Filled 2016-07-21: qty 4
  Filled 2016-07-21: qty 2
  Filled 2016-07-21: qty 3
  Filled 2016-07-21: qty 2
  Filled 2016-07-21: qty 4
  Filled 2016-07-21: qty 2
  Filled 2016-07-21: qty 3
  Filled 2016-07-21: qty 4

## 2016-07-21 MED ORDER — DIPHENHYDRAMINE HCL 25 MG PO CAPS
25.0000 mg | ORAL_CAPSULE | Freq: Four times a day (QID) | ORAL | Status: DC | PRN
  Administered 2016-07-21 – 2016-07-26 (×5): 25 mg via ORAL
  Filled 2016-07-21 (×5): qty 1

## 2016-07-21 MED ORDER — SACUBITRIL-VALSARTAN 24-26 MG PO TABS
1.0000 | ORAL_TABLET | Freq: Two times a day (BID) | ORAL | Status: DC
  Administered 2016-07-21 – 2016-07-22 (×4): 1 via ORAL
  Filled 2016-07-21 (×4): qty 1

## 2016-07-21 MED ORDER — ASPIRIN 81 MG PO CHEW
324.0000 mg | CHEWABLE_TABLET | Freq: Once | ORAL | Status: AC
  Administered 2016-07-21: 324 mg via ORAL
  Filled 2016-07-21: qty 4

## 2016-07-21 MED ORDER — ACETAMINOPHEN 325 MG PO TABS
650.0000 mg | ORAL_TABLET | Freq: Four times a day (QID) | ORAL | Status: DC | PRN
Start: 2016-07-21 — End: 2016-08-04

## 2016-07-21 MED ORDER — CARVEDILOL 6.25 MG PO TABS
6.2500 mg | ORAL_TABLET | Freq: Two times a day (BID) | ORAL | Status: DC
  Administered 2016-07-21: 6.25 mg via ORAL
  Filled 2016-07-21: qty 1

## 2016-07-21 MED ORDER — IPRATROPIUM BROMIDE 0.02 % IN SOLN
0.5000 mg | Freq: Once | RESPIRATORY_TRACT | Status: AC
  Administered 2016-07-21: 0.5 mg via RESPIRATORY_TRACT
  Filled 2016-07-21: qty 2.5

## 2016-07-21 NOTE — ED Triage Notes (Signed)
Per [facility], patient here with multiple complaints included generalized weakness, lightheadedness, SOB, intermittent CP, and chronic cough. Patient has leukemia, treated at [facility]. A&O x4. Expiratory wheezes noted to left lung fields.

## 2016-07-21 NOTE — ED Notes (Signed)
Patient quick to agitation. MD notified and aware. Bed alarm on for patient safety. Jaundice noted to both eyes. Negative for nystagmus.

## 2016-07-21 NOTE — H&P (Signed)
[facility] [place] NAME: Bruce [name]    MR#:  [id number]  DATE OF BIRTH:  [birth year]  DATE OF ADMISSION:  07/21/2016  PRIMARY CARE PHYSICIAN: [name], MD   REQUESTING/REFERRING PHYSICIAN: Drenda Freeze, MD  CHIEF COMPLAINT:   Chief Complaint  Patient presents with  . Shortness of Breath   Shortness breath HISTORY OF PRESENT ILLNESS:  Bruce [name]  is a 71 y.o. male with a known history of CAD, CHF, CK D, CML and cocaine abuse. The patient presently ED with worsening shortness of breath and productive cough for several months. He was admitted to [facility] in March and was thought to have a coronary vasospasm from cocaine use, CHF exacerbation and non-STEMI. He said he has been using Lasix but sometimes be some Lasix. He denies any drug abuse or alcohol abuse this time. He is currently homeless and living with friends. He feels dizzy and lightheaded in the restaurant this morning. Per ED physician, the patient has rales in bilateral lungs. He is treated with the Lasix IV in the ED. But the chest x-ray report didn't show any pulmonary edema.  PAST MEDICAL HISTORY:   Past Medical History:  Diagnosis Date  . Bell's palsy   . Chronic combined systolic and diastolic CHF, NYHA class 3 ([place])   . CKD (chronic kidney disease), stage II   . CML (chronic myelocytic leukemia) ([place])   . Coronary artery disease, non-occlusive   . Hypercholesterolemia   . Hypertension   . Leukemia ([place])   . Stroke ([place])    No residual limb weakness.  Walks with cane at baseline.   Marland Kitchen TIA (transient ischemic attack) 05/10/2014    PAST SURGICAL HISTORY:   Past Surgical History:  Procedure Laterality Date  . BACK SURGERY    . HIP ARTHROPLASTY Right    orif  . ORIF FOREARM FRACTURE Right     SOCIAL HISTORY:   Social History  Substance Use Topics  . Smoking status: Former Smoker    Packs/day: 0.50    Years: 50.00    Types: Cigarettes    . Smokeless tobacco: Never Used  . Alcohol use No    FAMILY HISTORY:   Family History  Problem Relation Age of Onset  . Diabetes Mother   . Hypertension Mother   . Diabetes Father   . Hypertension Father   . Diabetes Brother   . Hypertension Brother   . Diabetes Sister   . Hypertension Sister   . Diabetes Brother   . Hypertension Brother   . Diabetes Sister   . Hypertension Sister     DRUG ALLERGIES:  No Known Allergies  REVIEW OF SYSTEMS:   Review of Systems  Constitutional: Positive for malaise/fatigue. Negative for chills, fever and weight loss.  HENT: Negative for congestion and sore throat.   Eyes: Negative for blurred vision and double vision.  Respiratory: Positive for cough, shortness of breath and wheezing. Negative for hemoptysis and sputum production.   Cardiovascular: Positive for chest pain. Negative for palpitations, orthopnea and leg swelling.  Gastrointestinal: Positive for nausea. Negative for abdominal pain, blood in stool, diarrhea, melena and vomiting.  Genitourinary: Negative for dysuria and hematuria.  Musculoskeletal: Negative for back pain.  Skin: Negative for itching and rash.  Neurological: Positive for dizziness and weakness. Negative for focal weakness, loss of consciousness and headaches.  Psychiatric/Behavioral: Negative for depression. The patient is not nervous/anxious.     MEDICATIONS AT HOME:  Prior to Admission medications   Medication Sig Start Date End Date Taking? Authorizing Provider  acetaminophen (TYLENOL) 325 MG tablet Take 2 tablets (650 mg total) by mouth every 4 (four) hours as needed for headache or mild pain. 06/06/15  Yes [name], MD  albuterol (PROVENTIL HFA;VENTOLIN HFA) 108 (90 BASE) MCG/ACT inhaler Inhale 2 puffs into the lungs every 4 (four) hours as needed for wheezing or shortness of breath. 08/08/14  Yes Noemi Chapel, MD  aspirin 81 MG chewable tablet Chew 1 tablet (81 mg total) by mouth daily. 03/23/16  Yes  Loletha Grayer, MD  atorvastatin (LIPITOR) 40 MG tablet Take 1 tablet (40 mg total) by mouth daily. 12/25/15  Yes Amy D Clegg, NP  bosutinib (BOSULIF) 100 MG tablet Take 300 mg by mouth daily with breakfast. Take with food.   Yes Historical Provider, MD  carvedilol (COREG) 6.25 MG tablet Take 1 tablet (6.25 mg total) by mouth 2 (two) times daily. 05/05/16  Yes Theodoro Grist, MD  clopidogrel (PLAVIX) 75 MG tablet Take 1 tablet (75 mg total) by mouth daily. 03/21/14  Yes [name], MD  ENTRESTO 24-26 MG Take 1 tablet by mouth 2 (two) times daily. 03/19/16  Yes Historical Provider, MD  furosemide (LASIX) 40 MG tablet Take 40 mg by mouth daily.  03/19/16  Yes Historical Provider, MD  metFORMIN (GLUCOPHAGE) 500 MG tablet Take 500 mg by mouth 2 (two) times daily with a meal.   Yes Historical Provider, MD  metoprolol succinate (TOPROL-XL) 25 MG 24 hr tablet Take 25 mg by mouth daily.   Yes Historical Provider, MD  spironolactone (ALDACTONE) 25 MG tablet Take 1 tablet (25 mg total) by mouth daily. 05/06/16  Yes Theodoro Grist, MD  tiotropium (SPIRIVA) 18 MCG inhalation capsule Place 18 mcg into inhaler and inhale daily.   Yes Historical Provider, MD  amiodarone (PACERONE) 200 MG tablet Take 1 tablet (200 mg total) by mouth daily. Patient not taking: Reported on 05/02/2016 12/30/15   Shirley Friar, PA-C  meclizine (ANTIVERT) 25 MG tablet Take 1 tablet (25 mg total) by mouth 3 (three) times daily as needed for dizziness. Patient not taking: Reported on 07/21/2016 05/07/16   Theodoro Grist, MD      VITAL SIGNS:  Blood pressure (!) 126/97, pulse 95, temperature 98.5 F (36.9 C), temperature source Oral, resp. rate 17, height 5\' 8"  (1.727 m), weight 167 lb (75.8 kg), SpO2 93 %.  PHYSICAL EXAMINATION:  Physical Exam  GENERAL:  71 y.o.-year-old patient lying in the bed with no acute distress.  EYES: Pupils equal, round, reactive to light and accommodation. No scleral icterus. Extraocular muscles  intact.  HEENT: Head atraumatic, normocephalic. Oropharynx and nasopharynx clear.  NECK:  Supple, no jugular venous distention. No thyroid enlargement, no tenderness.  LUNGS: Normal breath sounds bilaterally, no wheezing, mild rales, no rhonchi or crepitation. No use of accessory muscles of respiration.  CARDIOVASCULAR: S1, S2 normal. No murmurs, rubs, or gallops.  ABDOMEN: Soft, nontender, nondistended. Bowel sounds present. No organomegaly or mass.  EXTREMITIES: No pedal edema, cyanosis, or clubbing.  NEUROLOGIC: Cranial nerves II through XII are intact. Muscle strength 5/5 in all extremities. Sensation intact. Gait not checked.  PSYCHIATRIC: The patient is alert and oriented x 3.  SKIN: No obvious rash, lesion, or ulcer.   LABORATORY PANEL:   CBC  Recent Labs Lab 07/21/16 0917  WBC 20.9*  HGB 10.5*  HCT 33.6*  PLT 854*   ------------------------------------------------------------------------------------------------------------------  Chemistries   Recent Labs Lab  07/21/16 0917  NA 140  K 3.3*  CL 104  CO2 25  GLUCOSE 208*  BUN 20  CREATININE 1.32*  CALCIUM 9.0  AST 34  ALT 20  ALKPHOS 207*  BILITOT 2.6*   ------------------------------------------------------------------------------------------------------------------  Cardiac Enzymes  Recent Labs Lab 07/21/16 0917  TROPONINI 0.10*   ------------------------------------------------------------------------------------------------------------------  RADIOLOGY:  Dg Chest 2 View  Result Date: 07/21/2016 CLINICAL DATA:  Two months of generalize weakness, lightheadedness, shortness of breath, and intermittent chest pain. Chronic cough. History of leukemia, former smoker. EXAM: CHEST  2 VIEW COMPARISON:  Chest x-ray of May 02, 2016 FINDINGS: The lungs are well-expanded. There is no focal infiltrate. There is no pleural effusion. The cardiac silhouette is mildly enlarged but stable. The central pulmonary  vascularity is prominent but there is no definite cephalization. There is calcification in the wall of the aortic arch. The mediastinum is normal in width. There is multilevel degenerative disc disease of the thoracic spine. IMPRESSION: Stable cardiomegaly without pulmonary edema. No pneumonia nor other acute cardiopulmonary abnormality. Thoracic aortic atherosclerosis. Electronically Signed   By: David  Martinique M.D.   On: 07/21/2016 09:55      IMPRESSION AND PLAN:   Acute on chronic systolic and diastolic CHF.   The patient will be admitted to telemetry floor. Start Lasix IV twice a day. CHF protocol. Cardiology consult. Echo last December showed ejection fraction 20 %.  CKD stage III, follow-up BMP while on Lasix.  Elevated troponin. Possible due to demanding ischemia. Follow-up troponin level, continue aspirin and follow-up cardiology consult. CAD. Continue aspirin and the Lipitor. Hypokalemia. Give potassium supplement and check magnesium level.  CML. WBC is a stable.  All the records are reviewed and case discussed with ED provider. Management plans discussed with the patient, family and they are in agreement.  CODE STATUS: Full code.  TOTAL TIME TAKING CARE OF THIS PATIENT: 57 minutes.    Demetrios Loll M.D on 07/21/2016 at 11:45 AM  Between 7am to 6pm - Pager - [phone]  After 6pm go to [email or web address] - Technical brewer [facility]  Office  [phone]  CC: Primary care physician; [name], MD   Note: This dictation was prepared with Dragon dictation along with smaller phrase technology. Any transcriptional errors that result from this process are unintentional.

## 2016-07-21 NOTE — ED Provider Notes (Signed)
[facility] Provider Note   CSN: [id number] Arrival date & time: 07/21/16  6761     History   Chief Complaint Chief Complaint  Patient presents with  . Shortness of Breath    HPI Bruce [name] is a 71 y.o. male hx of systolic and diastolic CHF, CKD, CML on oral chemo, CAD, cocaine abuse, Who presented with shortness of breath, cough, weakness. Patient states that he has been weak for several months and has been having productive cough. He has been having worsening shortness of breath over the last several months and has been at multiple hospitals per patient. Upon chart review, patient was admitted to [facility] at the end of March and was thought to have coronary vasospasm from cocaine use, NSTEMI, CHF exacerbation. Patient is currently taking lasix. Patient denies further drug or alcohol use but is currently homeless and living with friends. He was at a restaurant this morning with a friend and suddenly felt dizzy and light headed and felt like passing out. Also had worsening chest pain as well. States that his leg swelling is unchanged. He called EMS and was brought here by EMS.   The history is provided by the patient.    Past Medical History:  Diagnosis Date  . Bell's palsy   . Chronic combined systolic and diastolic CHF, NYHA class 3 ([place])   . CKD (chronic kidney disease), stage II   . CML (chronic myelocytic leukemia) ([place])   . Coronary artery disease, non-occlusive   . Hypercholesterolemia   . Hypertension   . Leukemia ([place])   . Stroke ([place])    No residual limb weakness.  Walks with cane at baseline.   Marland Kitchen TIA (transient ischemic attack) 05/10/2014    Patient Active Problem List   Diagnosis Date Noted  . Acute on chronic systolic CHF (congestive heart failure) ([place]) 05/07/2016  . Unsteady gait 05/07/2016  . Acute on chronic combined systolic and diastolic CHF (congestive heart failure) ([place]) 05/05/2016  . Atypical chest pain 05/05/2016  .  Cardiomyopathy 05/05/2016  . Atrial arrhythmia 05/05/2016  . Muscle weakness (generalized)   . Thrombocytosis ([place])   . Chest pain 05/02/2016  . Near syncope 05/02/2016  . Acute respiratory distress 03/22/2016  . Homeless 03/22/2016  . Slurred speech 03/22/2016  . History of stroke 03/22/2016  . Elevated lactic acid level 10/11/2015  . CKD (chronic kidney disease), stage II   . Dizziness 01/25/2015  . Acute on chronic combined systolic and diastolic CHF, NYHA class 3 ([place]) 01/25/2015  . Essential hypertension 01/25/2015  . Compliance poor 01/25/2015  . Coronary artery disease, non-occlusive   . H/O: stroke with residual effects 09/13/2014  . Transaminitis 09/13/2014  . Elevated troponin 09/13/2014  . TIA (transient ischemic attack) 05/10/2014  . CML (chronic myelocytic leukemia) ([place]) 03/27/2014  . Leukocytosis 03/18/2014    Past Surgical History:  Procedure Laterality Date  . BACK SURGERY    . HIP ARTHROPLASTY Right    orif  . ORIF FOREARM FRACTURE Right        Home Medications    Prior to Admission medications   Medication Sig Start Date End Date Taking? Authorizing Provider  acetaminophen (TYLENOL) 325 MG tablet Take 2 tablets (650 mg total) by mouth every 4 (four) hours as needed for headache or mild pain. 06/06/15  Yes [name], MD  albuterol (PROVENTIL HFA;VENTOLIN HFA) 108 (90 BASE) MCG/ACT inhaler Inhale 2 puffs into the lungs every 4 (four) hours as needed for wheezing or  shortness of breath. 08/08/14  Yes Noemi Chapel, MD  aspirin 81 MG chewable tablet Chew 1 tablet (81 mg total) by mouth daily. 03/23/16  Yes Loletha Grayer, MD  atorvastatin (LIPITOR) 40 MG tablet Take 1 tablet (40 mg total) by mouth daily. 12/25/15  Yes Amy D Clegg, NP  bosutinib (BOSULIF) 100 MG tablet Take 300 mg by mouth daily with breakfast. Take with food.   Yes Historical Provider, MD  carvedilol (COREG) 6.25 MG tablet Take 1 tablet (6.25 mg total) by mouth 2 (two) times daily. 05/05/16   Yes Theodoro Grist, MD  clopidogrel (PLAVIX) 75 MG tablet Take 1 tablet (75 mg total) by mouth daily. 03/21/14  Yes [name], MD  ENTRESTO 24-26 MG Take 1 tablet by mouth 2 (two) times daily. 03/19/16  Yes Historical Provider, MD  furosemide (LASIX) 40 MG tablet Take 40 mg by mouth daily.  03/19/16  Yes Historical Provider, MD  metFORMIN (GLUCOPHAGE) 500 MG tablet Take 500 mg by mouth 2 (two) times daily with a meal.   Yes Historical Provider, MD  metoprolol succinate (TOPROL-XL) 25 MG 24 hr tablet Take 25 mg by mouth daily.   Yes Historical Provider, MD  spironolactone (ALDACTONE) 25 MG tablet Take 1 tablet (25 mg total) by mouth daily. 05/06/16  Yes Theodoro Grist, MD  tiotropium (SPIRIVA) 18 MCG inhalation capsule Place 18 mcg into inhaler and inhale daily.   Yes Historical Provider, MD  amiodarone (PACERONE) 200 MG tablet Take 1 tablet (200 mg total) by mouth daily. Patient not taking: Reported on 05/02/2016 12/30/15   Shirley Friar, PA-C  meclizine (ANTIVERT) 25 MG tablet Take 1 tablet (25 mg total) by mouth 3 (three) times daily as needed for dizziness. Patient not taking: Reported on 07/21/2016 05/07/16   Theodoro Grist, MD    Family History Family History  Problem Relation Age of Onset  . Diabetes Mother   . Hypertension Mother   . Diabetes Father   . Hypertension Father   . Diabetes Brother   . Hypertension Brother   . Diabetes Sister   . Hypertension Sister   . Diabetes Brother   . Hypertension Brother   . Diabetes Sister   . Hypertension Sister     Social History Social History  Substance Use Topics  . Smoking status: Former Smoker    Packs/day: 0.50    Years: 50.00    Types: Cigarettes  . Smokeless tobacco: Never Used  . Alcohol use No     Allergies   Patient has no known allergies.   Review of Systems Review of Systems  Respiratory: Positive for shortness of breath.   All other systems reviewed and are negative.    Physical Exam Updated Vital  Signs BP 136/90   Pulse 97   Temp 98.5 F (36.9 C) (Oral)   Resp 17   Ht 5\' 8"  (1.727 m)   Wt 167 lb (75.8 kg)   SpO2 95%   BMI 25.39 kg/m   Physical Exam  Constitutional: He is oriented to person, place, and time.  Uncomfortable, chronically ill   HENT:  Head: Normocephalic.  Mouth/Throat: Oropharynx is clear and moist.  Eyes: EOM are normal. Pupils are equal, round, and reactive to light.  Neck: Normal range of motion. Neck supple.  Cardiovascular: Normal rate, regular rhythm and normal heart sounds.   Pulmonary/Chest:  Diminished throughout, minimal expiratory wheezing, no retractions   Abdominal: Soft. Bowel sounds are normal. He exhibits no distension. There is no tenderness.  Musculoskeletal:  1+ edema, no calf tenderness   Neurological: He is alert and oriented to person, place, and time.  Skin: Skin is warm.  Psychiatric:  Slightly agitated   Nursing note and vitals reviewed.    ED Treatments / Results  Labs (all labs ordered are listed, but only abnormal results are displayed) Labs Reviewed  CBC WITH DIFFERENTIAL/PLATELET - Abnormal; Notable for the following:       Result Value   WBC 20.9 (*)    RBC 3.56 (*)    Hemoglobin 10.5 (*)    HCT 33.6 (*)    MCHC 31.4 (*)    RDW 20.9 (*)    Platelets 854 (*)    Neutro Abs 17.6 (*)    Monocytes Absolute 1.8 (*)    Basophils Absolute 0.2 (*)    All other components within normal limits  COMPREHENSIVE METABOLIC PANEL - Abnormal; Notable for the following:    Potassium 3.3 (*)    Glucose, Bld 208 (*)    Creatinine, Ser 1.32 (*)    Alkaline Phosphatase 207 (*)    Total Bilirubin 2.6 (*)    GFR calc non Af Amer 53 (*)    All other components within normal limits  TROPONIN I - Abnormal; Notable for the following:    Troponin I 0.10 (*)    All other components within normal limits  BRAIN NATRIURETIC PEPTIDE - Abnormal; Notable for the following:    B Natriuretic Peptide 3,630.0 (*)    All other components  within normal limits  ETHANOL  URINE DRUG SCREEN, QUALITATIVE ([facility] ONLY)    EKG  EKG Interpretation None      ED ECG REPORT I, Wandra Arthurs, the attending physician, personally viewed and interpreted this ECG.   Date: 07/21/2016  EKG Time: 9:13 am  Rate: 105  Rhythm: sinus tachycardia  Axis: normal  Intervals:none  ST&T Change: nonspecific changes    Radiology Dg Chest 2 View  Result Date: 07/21/2016 CLINICAL DATA:  Two months of generalize weakness, lightheadedness, shortness of breath, and intermittent chest pain. Chronic cough. History of leukemia, former smoker. EXAM: CHEST  2 VIEW COMPARISON:  Chest x-ray of May 02, 2016 FINDINGS: The lungs are well-expanded. There is no focal infiltrate. There is no pleural effusion. The cardiac silhouette is mildly enlarged but stable. The central pulmonary vascularity is prominent but there is no definite cephalization. There is calcification in the wall of the aortic arch. The mediastinum is normal in width. There is multilevel degenerative disc disease of the thoracic spine. IMPRESSION: Stable cardiomegaly without pulmonary edema. No pneumonia nor other acute cardiopulmonary abnormality. Thoracic aortic atherosclerosis. Electronically Signed   By: Etana Beets  Martinique M.D.   On: 07/21/2016 09:55    Procedures Procedures (including critical care time)  Medications Ordered in ED Medications  methylPREDNISolone sodium succinate (SOLU-MEDROL) 125 mg/2 mL injection 125 mg (not administered)  furosemide (LASIX) injection 40 mg (not administered)  aspirin chewable tablet 324 mg (not administered)  albuterol (PROVENTIL) (2.5 MG/3ML) 0.083% nebulizer solution 5 mg (5 mg Nebulization Given 07/21/16 1005)  ipratropium (ATROVENT) nebulizer solution 0.5 mg (0.5 mg Nebulization Given 07/21/16 1024)     Initial Impression / Assessment and Plan / ED Course  I have reviewed the triage vital signs and the nursing notes.  Pertinent labs & imaging  results that were available during my care of the patient were reviewed by me and considered in my medical decision making (see chart for details).    Lanae Boast  B Kontos is a 71 y.o. male here with SOB, chest pain, dizziness. I think likely multi factorial- CHF vs COPD vs NSTEMI vs vasospasm from cocaine abuse. He had recent CT angio that showed no PE. Will get labs, BNP, Trop, CXR. Will give nebs and reassess.    10:28 AM Trop 0.1. No chest pain currently. Likely demand ischemia from CHF. BNP 3000. CXR showed no obvious pulmonary edema. Given ASA, lasix, solumedrol, nebs. Will admit for COPD, CHF, elevated trop.   Final Clinical Impressions(s) / ED Diagnoses   Final diagnoses:  None    New Prescriptions New Prescriptions   No medications on file     Drenda Freeze, MD 07/21/16 1029

## 2016-07-21 NOTE — Progress Notes (Signed)
Clinical Education officer, museum (CSW) received SNF consult. PT needs to be ordered. CSW will follow up after PT recommendations.   [employer], LCSW [phone]

## 2016-07-21 NOTE — Consult Note (Signed)
Cardiology Consultation Note  Patient ID: Bruce [name], MRN: [id number], DOB/AGE: 71-Dec-1947 71 y.o. Admit date: 07/21/2016   Date of Consult: 07/21/2016 Primary Physician: [name], MD Primary Cardiologist: Dr. Terrence Dupont, MD Requesting Physician: Dr. Bridgett Larsson, MD  Chief Complaint: SOB, tremors Reason for Consult: Acute on chronic systolic CHF  HPI: Bruce [name] is a 71 y.o. male who is being seen today for the evaluation of increased SOB at the request of Dr. Bridgett Larsson, MD. Patient has a h/o chronic combined CHF class III, NICM with cardiac cath in 2016 showing nonobstructive CAD, previously homeless in November and December 2017 now living in a church, CKD stage II-III, medication noncompliance, prior cocaine abuse, CVA x 2 without Afib work up, atrial tachycardia on amiodarone, HTN, ETOH abuse, and CML not currently under treatment who was recently admitted in mid December 2017 for volume overload, again in 04/2016 for same at [facility] followed by admission to [facility] in 05/2016 for volume overload returned to [facility] with increased SOB x 1 week.    Prior echo in 01/2015 showed an EF of 15% with diffuse hypokinesis, moderate TR, PASP 45 mmHg. Echo from 10/2015 showed an EF of 20-25%, diffuse hypokinesis, mild MR, mildly dilated left atrium, severely reduced RV systolic function, moderate TR, PASP 58 mmHg. He has been managed on Coreg, Entresto, digoxin, spironolactone, and torsemide as an outpatient, though has had compliance issues 2/2 his living situation. He has not followed up with [facility] for his CML since 03/2015. Prior BNP of 3489 in August 2017. He reports a dry weight of 165 pounds. Recently admitted in mid December 2017 in SOB with a weight of 181 pounds. Echo that admission showed an EF of 15-20%, diffuse hypokinesis, unable to assess diastolic function, mild MR, LA mild to moderately dilated, RV systolic function was moderately reduced, moderate to severe TR, PASP 50 mmHg. He was diuresed  successfully. He did not show for his hospital follow up.    Following his 03/2016 admisstion he was seen in the [facility] ED for 2nd opinion of his slurred speech and most recently in the [facility] ED for multiple complaints including cough, abdominal hernia, and jaw pain. He reported just wanting to take medications and go home. Admitted 04/2016 with multiple complaints including SOB, chest pain, abdominal swelling, and abdominal hernia. He was living in a church at that time. He was noncompliant with his medications and was not following a heart healthy diet. He is uncertain what his weight has been at home. During his 04/2016 admission he was noted to have a BNP of 2219. Head CT not acute. He was successfully diuresed to a discharge weight of 168 pounds. He was advised he needed to follow up with oncology and cardiology as an outpatient. In 05/2016 he was admitted to [facility] for volume overload requiring IV diuresis. Echo at that time in [facility] showed an EF of 15-20% which was consistent with his prior TTE in 03/2016. He was continued on Entresto 24/26 mg bid, Toprol XL 25 mg daily, Lasix 40 mg daily, , ASA 81 mg daily, Lipitor 40 mg daily. Since that admission to [facility] in February he has been seen in the [facility] ED 6 times for various ailments including cocaine abuse/cocaine withdrawal, alcohol abuse, symptomatic anemia, atypical chest pain, and generalized weakness. It does appear he has established with [facility] oncology, last being seen by them on 06/30/2016. It also appears he has established with [facility] psychiatry.  Patient is no longer living at a  local church and spends time evenings in various hotels/motels as well as on the streets. He commutes between [place], [place], [place], and [place] via bus service. He sold his car for money.    Patient presented to [facility] on 4/11 with increased SOB. He has not been able to afford his medications lately and has missed at least the past 1 week of medications. He does not  weight himself so is uncertain of his dry weight. He has not noted any LE edema though has noted some abdominal distension. Also with a dry cough. No orthopnea or early satiety. No chest pain. Patient has stayed at a local hotel in [place] the past 3 nights. This morning he was at a restaurant, stood up and felt dizzy. Because of this the restaurant called EMS.   Upon his arrival to [facility] he was noted to have BP 119/80, heart rate 99 bpm, temperature 98.56F, oxygen saturation 98% on room air, weight 167 pounds. Labs showed BNP 3630, troponin 0.10, potassium 3.3, serum creatinine 1.32 with a baseline approximately 0.9-1.2, alkaline phosphatase 207, total bilirubin 2.6, white blood cell count 20.9, hemoglobin 10.5, platelet count 854, influenza A and B negative, ethanol less than 5, urine drug screen negative, chest x-ray was stable cardiomegaly and without pulmonary edema. No acute cardiopulmonary abnormality was noted. EKG was sinus tachycardia, 105 BPM, left axis deviation, lateral T-wave inversion. In the ER patient was given albuterol nebulizer/Atrovent nebulizer, aspirin 324 mg 1, IV Lasix 40 mg 1, Solu-Medrol 125 mg IV 1. He was also given home medications including amiodarone 200 mg, Toprol-XL 25 mg, Coreg 6.25 mg (it is unclear why patient received to beta blocker), Plavix (though this was discontinued at his last [facility] hospital discharge), spironolactone 25 mg, Lipitor 40 mg, KCl 40 mEq, Entresto 24/26. Cardiology was asked to further  Past Medical History:  Diagnosis Date  . Bell's palsy   . Chronic combined systolic and diastolic CHF, NYHA class 3 ([place])   . CKD (chronic kidney disease), stage II   . CML (chronic myelocytic leukemia) ([place])   . Coronary artery disease, non-occlusive   . Hypercholesterolemia   . Hypertension   . Leukemia ([place])   . Stroke ([place])    No residual limb weakness.  Walks with cane at baseline.   Marland Kitchen TIA (transient ischemic attack) 05/10/2014      Most Recent  Cardiac Studies: As above   Surgical History:  Past Surgical History:  Procedure Laterality Date  . BACK SURGERY    . HIP ARTHROPLASTY Right    orif  . ORIF FOREARM FRACTURE Right      Home Meds: Prior to Admission medications   Medication Sig Start Date End Date Taking? Authorizing Provider  acetaminophen (TYLENOL) 325 MG tablet Take 2 tablets (650 mg total) by mouth every 4 (four) hours as needed for headache or mild pain. 06/06/15  Yes [name], MD  albuterol (PROVENTIL HFA;VENTOLIN HFA) 108 (90 BASE) MCG/ACT inhaler Inhale 2 puffs into the lungs every 4 (four) hours as needed for wheezing or shortness of breath. 08/08/14  Yes Noemi Chapel, MD  aspirin 81 MG chewable tablet Chew 1 tablet (81 mg total) by mouth daily. 03/23/16  Yes Loletha Grayer, MD  atorvastatin (LIPITOR) 40 MG tablet Take 1 tablet (40 mg total) by mouth daily. 12/25/15  Yes Amy D Clegg, NP  bosutinib (BOSULIF) 100 MG tablet Take 300 mg by mouth daily with breakfast. Take with food.   Yes Historical Provider, MD  carvedilol (  COREG) 6.25 MG tablet Take 1 tablet (6.25 mg total) by mouth 2 (two) times daily. 05/05/16  Yes Theodoro Grist, MD  clopidogrel (PLAVIX) 75 MG tablet Take 1 tablet (75 mg total) by mouth daily. 03/21/14  Yes [name], MD  ENTRESTO 24-26 MG Take 1 tablet by mouth 2 (two) times daily. 03/19/16  Yes Historical Provider, MD  furosemide (LASIX) 40 MG tablet Take 40 mg by mouth daily.  03/19/16  Yes Historical Provider, MD  metFORMIN (GLUCOPHAGE) 500 MG tablet Take 500 mg by mouth 2 (two) times daily with a meal.   Yes Historical Provider, MD  metoprolol succinate (TOPROL-XL) 25 MG 24 hr tablet Take 25 mg by mouth daily.   Yes Historical Provider, MD  spironolactone (ALDACTONE) 25 MG tablet Take 1 tablet (25 mg total) by mouth daily. 05/06/16  Yes Theodoro Grist, MD  tiotropium (SPIRIVA) 18 MCG inhalation capsule Place 18 mcg into inhaler and inhale daily.   Yes Historical Provider, MD  amiodarone  (PACERONE) 200 MG tablet Take 1 tablet (200 mg total) by mouth daily. Patient not taking: Reported on 05/02/2016 12/30/15   Shirley Friar, PA-C  meclizine (ANTIVERT) 25 MG tablet Take 1 tablet (25 mg total) by mouth 3 (three) times daily as needed for dizziness. Patient not taking: Reported on 07/21/2016 05/07/16   Theodoro Grist, MD    Inpatient Medications:  . amiodarone  200 mg Oral Daily  . aspirin  81 mg Oral Daily  . atorvastatin  40 mg Oral Daily  . [START ON 07/22/2016] bosutinib  300 mg Oral Q breakfast  . enoxaparin (LOVENOX) injection  40 mg Subcutaneous Q24H  . furosemide  40 mg Intravenous BID  . insulin aspart  0-5 Units Subcutaneous QHS  . insulin aspart  0-9 Units Subcutaneous TID WC  . metoprolol succinate  25 mg Oral Daily  . sacubitril-valsartan  1 tablet Oral BID  . sodium chloride flush  3 mL Intravenous Q12H  . sodium chloride flush  3 mL Intravenous Q12H  . spironolactone  25 mg Oral Daily  . tiotropium  18 mcg Inhalation q morning - 10a     Allergies: No Known Allergies  Social History   Social History  . Marital status: Widowed    Spouse name: N/A  . Number of children: 0  . Years of education: N/A   Occupational History  . retired    Social History Main Topics  . Smoking status: Former Smoker    Packs/day: 0.50    Years: 50.00    Types: Cigarettes  . Smokeless tobacco: Never Used  . Alcohol use No  . Drug use: Yes  . Sexual activity: No   Other Topics Concern  . Not on file   Social History Narrative   Patient is right handed.   Patient drinks 1-2 cups daily.     Family History  Problem Relation Age of Onset  . Diabetes Mother   . Hypertension Mother   . Diabetes Father   . Hypertension Father   . Diabetes Brother   . Hypertension Brother   . Diabetes Sister   . Hypertension Sister   . Diabetes Brother   . Hypertension Brother   . Diabetes Sister   . Hypertension Sister      Review of Systems: Review of Systems    Constitutional: Positive for malaise/fatigue. Negative for chills, diaphoresis, fever and weight loss.  HENT: Negative for congestion.   Eyes: Negative for discharge and redness.  Respiratory: Positive  for cough and shortness of breath. Negative for hemoptysis, sputum production and wheezing.   Cardiovascular: Negative for chest pain, palpitations, orthopnea, claudication, leg swelling and PND.  Gastrointestinal: Negative for abdominal pain, blood in stool, heartburn, melena, nausea and vomiting.  Genitourinary: Negative for hematuria.  Musculoskeletal: Negative for falls and myalgias.  Skin: Negative for rash.  Neurological: Positive for tremors and weakness. Negative for dizziness, tingling, sensory change, speech change, focal weakness and loss of consciousness.  Endo/Heme/Allergies: Does not bruise/bleed easily.  Psychiatric/Behavioral: Positive for substance abuse. The patient is not nervous/anxious.   All other systems reviewed and are negative.   Labs:  Recent Labs  07/21/16 0917  TROPONINI 0.10*   Lab Results  Component Value Date   WBC 20.9 (H) 07/21/2016   HGB 10.5 (L) 07/21/2016   HCT 33.6 (L) 07/21/2016   MCV 94.3 07/21/2016   PLT 854 (H) 07/21/2016     Recent Labs Lab 07/21/16 0917  NA 140  K 3.3*  CL 104  CO2 25  BUN 20  CREATININE 1.32*  CALCIUM 9.0  PROT 7.7  BILITOT 2.6*  ALKPHOS 207*  ALT 20  AST 34  GLUCOSE 208*   Lab Results  Component Value Date   CHOL 212 (H) 03/06/2014   HDL 57 03/06/2014   LDLCALC 131 (H) 03/06/2014   TRIG 122 03/06/2014   Lab Results  Component Value Date   DDIMER 1.95 (H) 12/03/2015    Radiology/Studies:  Dg Chest 2 View  Result Date: 07/21/2016 CLINICAL DATA:  Two months of generalize weakness, lightheadedness, shortness of breath, and intermittent chest pain. Chronic cough. History of leukemia, former smoker. EXAM: CHEST  2 VIEW COMPARISON:  Chest x-ray of May 02, 2016 FINDINGS: The lungs are  well-expanded. There is no focal infiltrate. There is no pleural effusion. The cardiac silhouette is mildly enlarged but stable. The central pulmonary vascularity is prominent but there is no definite cephalization. There is calcification in the wall of the aortic arch. The mediastinum is normal in width. There is multilevel degenerative disc disease of the thoracic spine. IMPRESSION: Stable cardiomegaly without pulmonary edema. No pneumonia nor other acute cardiopulmonary abnormality. Thoracic aortic atherosclerosis. Electronically Signed   By: David  Martinique M.D.   On: 07/21/2016 09:55    EKG: Interpreted by me showed: sinus tachycardia, 105 BPM, left axis deviation, lateral T-wave inversion Telemetry: Interpreted by me showed: NSR with PACs, 70s bpm, occasional PVCs  Weights: Filed Weights   07/21/16 0907  Weight: 167 lb (75.8 kg)     Physical Exam: Blood pressure 135/83, pulse 95, temperature 97.5 F (36.4 C), temperature source Oral, resp. rate 18, height 5\' 8"  (1.727 m), weight 167 lb (75.8 kg), SpO2 97 %. Body mass index is 25.39 kg/m. General: Well developed, well nourished, in no acute distress. Head: Normocephalic, atraumatic, sclera non-icteric, no xanthomas, nares are without discharge.  Neck: Negative for carotid bruits. JVD elevated to the jaw. Lungs: Diminished breath sounds bilaterally. Breathing is unlabored. Heart: RRR with S1 S2. No murmurs, rubs, or gallops appreciated. Abdomen: Soft, non-tender, distended with normoactive bowel sounds. No hepatomegaly. No rebound/guarding. No obvious abdominal masses. Msk:  Strength and tone appear normal for age. Extremities: No clubbing or cyanosis. No edema. Distal pedal pulses are 2+ and equal bilaterally. Neuro: Alert and oriented X 3. No facial asymmetry. No focal deficit. Moves all extremities spontaneously. Psych:  Responds to questions appropriately with a normal affect.    Assessment and Plan:  Principal Problem:   Acute  respiratory distress Active Problems:   CML (chronic myelocytic leukemia) (HCC)   Acute on chronic combined systolic and diastolic CHF, NYHA class 3 (HCC)   Coronary artery disease, non-occlusive   Homeless   Elevated troponin   Essential hypertension   Compliance poor   CKD (chronic kidney disease), stage II    1. Acute respiratory distress with hypoxia: -Likely multifactorial including possible AECOPD from long standing prior tobacco abuse, acute on chronic combined CHF, CML with anemia, deconditioning  -Wean oxygen as able  2. Acute on chronic combined CHF: -He does have some volume overload -Agree with IV Lasix with KCl repletion -Continue with home Toprol XL 25 mg daily, I stopped Coreg as there is no indication for 2 beta blockers -Continue spironolactone  -Would change Entresto to ARB given his financial issues as he cannot afford this medication -No need to repeat echo given recent study in 03/2016 and 05/2016 -Monitor UOP, daily weights, strict Is & Os   3. Elevated troponin: -No chest pain -Consistent with prior levels in the setting of supply demand ischemia 2/2 volume overload -Recent LHC in 2016 that showed non-obstructive CAD -No plans for further ischemic evaluation at this time unless troponin trends significantly upwards -Continue ASA -Stopped Plavix as there has been no recent stenting and this was stopped at [facility] in 05/2016  4. NICM: -As above -Recent LHC in 2016 without obstructive CAD -ASA  5. Noncompliance: -Appears to be multifactorial including financial issues and lifestyle -Per primary service  6. CML: -Appears to be following [facility] hematology  -Per IM  7. CKD stage II: -Monitor with diuresis   8. Homeless/polysubstance abuse: -Reports "a little cocaine is not bad" -Suggest he evaluate is life goals   Signed, Marcille Blanco [place] Pager: [phone] 07/21/2016, 2:59 PM   Attending Note Patient seen and examined, agree  with detailed note above,  Patient presentation and plan discussed on rounds.  Cardiology consult placed by Dr.  Bridgett Larsson for SOB, cough, cardiomyopathy  EKG lab work, chest x-ray, echocardiogram reviewed independently by myself  71 year old gentleman known to numerous medical group says he travels from [facility] to [place] to [place] with nonischemic cardiomyopathy by catheterization 2016 history of cocaine and alcohol, CML who presents with shortness of breath, cough Notes indicating ejection fraction 20-25%  Reports having worsening cough and shortness of breath particularly this morning. Slow progression over several days, severe this morning. Denies having leg swelling, abdominal bloating, dramatic weight change. Reports that he has his medications. Scant sputum. Cough coming in spasms, unable to stop. Denies having symptoms like this in the past when he had congestive heart failure  Lab work reviewed showing potassium 3.3, normal LFTs, creatinine 1.32, BNP 3600, minimal elevation of troponin 0.1 down trending, hematocrit 33  On physical exam he is having significant coughing spasm episodes, otherwise no distress, no respiratory distress, JVD 12+, decreased breath sounds throughout with scant wheezes, abdomen soft nontender, no significant lower extremity edema  EKG reviewed personally by myself showing sinus tachycardia rate 100 bpm, T-wave inversions lateral leads  --- Coughing/respiratory distress Concerning for acute bronchitis, underlying COPD exacerbation Main reason he presented to the hospital was worsening cough likely from above He may benefit from antibiotics in addition to steroids if no rapid improvement in his symptoms in the next 24-48 hours Likely has a component of acute on chronic systolic CHF though unclear what his baseline is  ----Chronic systolic CHF Elevated BNP, likely chronic Moderately elevated right heart pressures Severely  depressed LV function, known from  previous studies Reports that he has his medications, though would confirm that he has entresto which is not generic Agree with IV diuresis for now, currently on 40 IV twice a day  --- Homeless Previously living in a church, now reports that he needs to meet with social worker case Freight forwarder for placement in the [place] shelter Notes indicating he travels between [facility] in [place] than 50% was spent in counseling and coordination of care with patient Total encounter time 110 minutes or more   Signed: Esmond Plants  M.D., Ph.D. [facility]

## 2016-07-21 NOTE — ED Notes (Signed)
Patient made aware of need of urine sample. States he is unable to void at this time. 

## 2016-07-22 DIAGNOSIS — I5043 Acute on chronic combined systolic (congestive) and diastolic (congestive) heart failure: Secondary | ICD-10-CM

## 2016-07-22 LAB — BASIC METABOLIC PANEL
Anion gap: 14 (ref 5–15)
BUN: 34 mg/dL — AB (ref 6–20)
CO2: 21 mmol/L — AB (ref 22–32)
Calcium: 9.1 mg/dL (ref 8.9–10.3)
Chloride: 104 mmol/L (ref 101–111)
Creatinine, Ser: 1.97 mg/dL — ABNORMAL HIGH (ref 0.61–1.24)
GFR calc Af Amer: 38 mL/min — ABNORMAL LOW (ref >60)
GFR, EST NON AFRICAN AMERICAN: 33 mL/min — AB (ref >60)
Glucose, Bld: 129 mg/dL — ABNORMAL HIGH (ref 65–99)
POTASSIUM: 4.2 mmol/L (ref 3.5–5.1)
Sodium: 139 mmol/L (ref 135–145)

## 2016-07-22 LAB — GLUCOSE, CAPILLARY
GLUCOSE-CAPILLARY: 206 mg/dL — AB (ref 65–99)
Glucose-Capillary: 124 mg/dL — ABNORMAL HIGH (ref 65–99)
Glucose-Capillary: 220 mg/dL — ABNORMAL HIGH (ref 65–99)
Glucose-Capillary: 204 mg/dL — ABNORMAL HIGH (ref 65–99)
Glucose-Capillary: 214 mg/dL — ABNORMAL HIGH (ref 65–99)

## 2016-07-22 LAB — HEMOGLOBIN A1C
Hgb A1c MFr Bld: 6 % — ABNORMAL HIGH (ref 4.8–5.6)
MEAN PLASMA GLUCOSE: 126 mg/dL

## 2016-07-22 LAB — CBC
HEMATOCRIT: 34.4 % — AB (ref 40.0–52.0)
Hemoglobin: 11 g/dL — ABNORMAL LOW (ref 13.0–18.0)
MCH: 30.3 pg (ref 26.0–34.0)
MCHC: 31.8 g/dL — AB (ref 32.0–36.0)
MCV: 95.1 fL (ref 80.0–100.0)
Platelets: 895 10*3/uL — ABNORMAL HIGH (ref 150–440)
RBC: 3.62 MIL/uL — ABNORMAL LOW (ref 4.40–5.90)
RDW: 21 % — AB (ref 11.5–14.5)
WBC: 23.3 10*3/uL — ABNORMAL HIGH (ref 3.8–10.6)

## 2016-07-22 MED ORDER — AZITHROMYCIN 250 MG PO TABS
500.0000 mg | ORAL_TABLET | Freq: Every day | ORAL | Status: DC
  Administered 2016-07-22 – 2016-07-26 (×5): 500 mg via ORAL
  Filled 2016-07-22 (×5): qty 2

## 2016-07-22 MED ORDER — IPRATROPIUM-ALBUTEROL 0.5-2.5 (3) MG/3ML IN SOLN
3.0000 mL | Freq: Four times a day (QID) | RESPIRATORY_TRACT | Status: DC
  Administered 2016-07-22 – 2016-07-24 (×9): 3 mL via RESPIRATORY_TRACT
  Filled 2016-07-22 (×9): qty 3

## 2016-07-22 MED ORDER — OXYCODONE-ACETAMINOPHEN 5-325 MG PO TABS
1.0000 | ORAL_TABLET | Freq: Four times a day (QID) | ORAL | Status: DC | PRN
  Administered 2016-07-22 – 2016-07-31 (×13): 1 via ORAL
  Filled 2016-07-22 (×14): qty 1

## 2016-07-22 MED ORDER — METHYLPREDNISOLONE SODIUM SUCC 125 MG IJ SOLR
60.0000 mg | Freq: Two times a day (BID) | INTRAMUSCULAR | Status: DC
  Administered 2016-07-22 – 2016-07-25 (×7): 60 mg via INTRAVENOUS
  Filled 2016-07-22 (×7): qty 2

## 2016-07-22 NOTE — Progress Notes (Signed)
Clinical Social Worker (CSW) met with patient and presented bed offers. Patient chose [facility]. CSW sent Doug admissions coordinator at [facility] a message making him aware of above. CSW will continue to follow and assist as needed.    , LCSW [phone] 

## 2016-07-22 NOTE — Progress Notes (Signed)
Inpatient Diabetes Program Recommendations  AACE/ADA: New Consensus Statement on Inpatient Glycemic Control (2015)  Target Ranges:  Prepandial:   less than 140 mg/dL      Peak postprandial:   less than 180 mg/dL (1-2 hours)      Critically ill patients:  140 - 180 mg/dL   Lab Results  Component Value Date   GLUCAP 124 (H) 07/22/2016   HGBA1C 6.0 (H) 07/21/2016    Review of Glycemic Control  Results for Bruce [name], Bruce [name] (MRN [id number]) as of 07/22/2016 10:26  Ref. Range 07/21/2016 11:51 07/21/2016 16:58 07/21/2016 20:46 07/22/2016 08:11  Glucose-Capillary Latest Ref Range: 65 - 99 mg/dL 169 (H) 264 (H) 224 (H) 124 (H)    Outpatient Diabetes medications: Metformin 500mg  bid Current orders for Inpatient glycemic control: Novolog 0-9 units tid, Novolog 0-5 units qhs.   *steroids 60mg  q12 h  Inpatient Diabetes Program Recommendations:  Agree with current orders for blood sugar management- recommend change diet to carbohydrate modified.  Gentry Fitz, RN, BA, MHA, CDE Diabetes Coordinator Inpatient Diabetes Program  [phone] (Team Pager) [phone] ([place]) 07/22/2016 10:30 AM

## 2016-07-22 NOTE — NC FL2 (Signed)
[place] LEVEL OF CARE SCREENING TOOL     IDENTIFICATION  Patient Name: Bruce [name] Birthdate: 05/31/1945 Sex: male Admission Date (Current Location): 07/21/2016  [facility] and [place] Number:  Engineering geologist and Address:  [facility], [address] [place], [place] [address]      Provider Number: [id number]  Attending Physician Name and Address:  Dustin Flock, MD  Relative Name and Phone Number:       Current Level of Care: Hospital Recommended Level of Care: [place] Prior Approval Number:    Date Approved/Denied:   PASRR Number:  [id number])  Discharge Plan: SNF    Current Diagnoses: Patient Active Problem List   Diagnosis Date Noted  . Acute on chronic systolic CHF (congestive heart failure) ([place]) 05/07/2016  . Unsteady gait 05/07/2016  . Atypical chest pain 05/05/2016  . Cardiomyopathy 05/05/2016  . Atrial arrhythmia 05/05/2016  . Muscle weakness (generalized)   . Thrombocytosis ([place])   . Chest pain 05/02/2016  . Near syncope 05/02/2016  . Acute respiratory distress 03/22/2016  . Homeless 03/22/2016  . Slurred speech 03/22/2016  . History of stroke 03/22/2016  . Elevated lactic acid level 10/11/2015  . CKD (chronic kidney disease), stage II   . Dizziness 01/25/2015  . Acute on chronic combined systolic and diastolic CHF, NYHA class 3 ([place]) 01/25/2015  . Essential hypertension 01/25/2015  . Compliance poor 01/25/2015  . Coronary artery disease, non-occlusive   . H/O: stroke with residual effects 09/13/2014  . Transaminitis 09/13/2014  . Elevated troponin 09/13/2014  . TIA (transient ischemic attack) 05/10/2014  . CML (chronic myelocytic leukemia) ([place]) 03/27/2014  . Leukocytosis 03/18/2014    Orientation RESPIRATION BLADDER Height & Weight     Self, Time, Place, Situation  Normal Continent Weight: 162 lb 12.8 oz (73.8 kg) Height:  5\' 8"  (172.7 cm)  BEHAVIORAL SYMPTOMS/MOOD  NEUROLOGICAL BOWEL NUTRITION STATUS   (none)  (none) Continent Diet (Diet: Heart Healthy )  AMBULATORY STATUS COMMUNICATION OF NEEDS Skin   Limited Assist Verbally Normal                       Personal Care Assistance Level of Assistance  Bathing, Feeding, Dressing Bathing Assistance: Limited assistance Feeding assistance: Independent Dressing Assistance: Limited assistance     Functional Limitations Info  Sight, Hearing, Speech Sight Info: Adequate Hearing Info: Adequate Speech Info: Adequate    SPECIAL CARE FACTORS FREQUENCY  PT (By licensed PT), OT (By licensed OT)     PT Frequency:  (5) OT Frequency:  (5)            Contractures      Additional Factors Info  Code Status, Allergies, Insulin Sliding Scale Code Status Info:  (Full Code. ) Allergies Info:  (No Known Allergies. )   Insulin Sliding Scale Info:  (NovoLog Insulin Injections. )       Current Medications (07/22/2016):  This is the current hospital active medication list Current Facility-Administered Medications  Medication Dose Route Frequency Provider Last Rate Last Dose  . 0.9 %  sodium chloride infusion  250 mL Intravenous PRN Demetrios Loll, MD      . acetaminophen (TYLENOL) tablet 650 mg  650 mg Oral Q6H PRN Demetrios Loll, MD       Or  . acetaminophen (TYLENOL) suppository 650 mg  650 mg Rectal Q6H PRN Demetrios Loll, MD      . albuterol (PROVENTIL) (2.5 MG/3ML) 0.083% nebulizer solution  2.5 mg  2.5 mg Nebulization Q2H PRN Demetrios Loll, MD   2.5 mg at 07/21/16 2101  . amiodarone (PACERONE) tablet 200 mg  200 mg Oral Daily Demetrios Loll, MD   200 mg at 07/22/16 0830  . aspirin chewable tablet 81 mg  81 mg Oral Daily Demetrios Loll, MD   81 mg at 07/22/16 0830  . atorvastatin (LIPITOR) tablet 40 mg  40 mg Oral Daily Demetrios Loll, MD   40 mg at 07/22/16 0830  . bisacodyl (DULCOLAX) EC tablet 5 mg  5 mg Oral Daily PRN Demetrios Loll, MD      . bosutinib (BOSULIF) tablet 300 mg  300 mg Oral Q breakfast Demetrios Loll, MD   300 mg at  07/21/16 1723  . diphenhydrAMINE (BENADRYL) capsule 25 mg  25 mg Oral Q6H PRN Demetrios Loll, MD   25 mg at 07/21/16 1723  . enoxaparin (LOVENOX) injection 40 mg  40 mg Subcutaneous Q24H Demetrios Loll, MD   40 mg at 07/21/16 2100  . guaiFENesin-dextromethorphan (ROBITUSSIN DM) 100-10 MG/5ML syrup 5 mL  5 mL Oral Q4H PRN Demetrios Loll, MD   5 mL at 07/22/16 0828  . insulin aspart (novoLOG) injection 0-5 Units  0-5 Units Subcutaneous QHS Demetrios Loll, MD   2 Units at 07/21/16 2101  . insulin aspart (novoLOG) injection 0-9 Units  0-9 Units Subcutaneous TID WC Demetrios Loll, MD   1 Units at 07/22/16 [phone]  . ipratropium-albuterol (DUONEB) 0.5-2.5 (3) MG/3ML nebulizer solution 3 mL  3 mL Nebulization Q6H Dustin Flock, MD   3 mL at 07/22/16 0924  . meclizine (ANTIVERT) tablet 25 mg  25 mg Oral TID PRN Demetrios Loll, MD      . methylPREDNISolone sodium succinate (SOLU-MEDROL) 125 mg/2 mL injection 60 mg  60 mg Intravenous Q12H Dustin Flock, MD   60 mg at 07/22/16 1038  . metoprolol succinate (TOPROL-XL) 24 hr tablet 25 mg  25 mg Oral Daily Demetrios Loll, MD   25 mg at 07/22/16 0830  . ondansetron (ZOFRAN) tablet 4 mg  4 mg Oral Q6H PRN Demetrios Loll, MD   4 mg at 07/22/16 0830   Or  . ondansetron (ZOFRAN) injection 4 mg  4 mg Intravenous Q6H PRN Demetrios Loll, MD      . oxyCODONE-acetaminophen (PERCOCET/ROXICET) 5-325 MG per tablet 1 tablet  1 tablet Oral Q6H PRN Dustin Flock, MD   1 tablet at 07/22/16 1015  . sacubitril-valsartan (ENTRESTO) 24-26 mg per tablet  1 tablet Oral BID Demetrios Loll, MD   1 tablet at 07/22/16 0830  . senna-docusate (Senokot-S) tablet 1 tablet  1 tablet Oral QHS PRN Demetrios Loll, MD      . sodium chloride flush (NS) 0.9 % injection 3 mL  3 mL Intravenous Q12H Demetrios Loll, MD   3 mL at 07/22/16 0831  . sodium chloride flush (NS) 0.9 % injection 3 mL  3 mL Intravenous Q12H Demetrios Loll, MD   3 mL at 07/22/16 0831  . sodium chloride flush (NS) 0.9 % injection 3 mL  3 mL Intravenous PRN Demetrios Loll, MD      . spironolactone  (ALDACTONE) tablet 25 mg  25 mg Oral Daily Demetrios Loll, MD   25 mg at 07/22/16 0830  . tiotropium (SPIRIVA) inhalation capsule 18 mcg  18 mcg Inhalation q morning - 10a Demetrios Loll, MD   18 mcg at 07/22/16 3016     Discharge Medications: Please see discharge summary for a list of discharge medications.  Relevant Imaging Results:  Relevant Lab Results:   Additional Information  (SSN: [id number])  Lessa Huge, Veronia Beets, LCSW

## 2016-07-22 NOTE — Progress Notes (Signed)
Patient Name: Bruce [name] Date of Encounter: 07/22/2016  Primary Cardiologist: [facility] Problem List     Principal Problem:   Acute respiratory distress Active Problems:   CML (chronic myelocytic leukemia) (HCC)   Acute on chronic combined systolic and diastolic CHF, NYHA class 3 (HCC)   Coronary artery disease, non-occlusive   Homeless   Elevated troponin   Essential hypertension   Compliance poor   CKD (chronic kidney disease), stage II     Subjective   SOB about the same. Renal function bumped with IV Lasix 40 mg bid to 1.97 from 1.32. K+ improved at 4.2. Minimal documented UOP. Dry cough persists. Documented weight down 5 pounds.   Inpatient Medications    Scheduled Meds: . amiodarone  200 mg Oral Daily  . aspirin  81 mg Oral Daily  . atorvastatin  40 mg Oral Daily  . bosutinib  300 mg Oral Q breakfast  . enoxaparin (LOVENOX) injection  40 mg Subcutaneous Q24H  . insulin aspart  0-5 Units Subcutaneous QHS  . insulin aspart  0-9 Units Subcutaneous TID WC  . metoprolol succinate  25 mg Oral Daily  . sacubitril-valsartan  1 tablet Oral BID  . sodium chloride flush  3 mL Intravenous Q12H  . sodium chloride flush  3 mL Intravenous Q12H  . spironolactone  25 mg Oral Daily  . tiotropium  18 mcg Inhalation q morning - 10a   Continuous Infusions:  PRN Meds: sodium chloride, acetaminophen **OR** acetaminophen, albuterol, bisacodyl, diphenhydrAMINE, guaiFENesin-dextromethorphan, meclizine, ondansetron **OR** ondansetron (ZOFRAN) IV, senna-docusate, sodium chloride flush   Vital Signs    Vitals:   07/21/16 1158 07/21/16 2014 07/22/16 0409 07/22/16 0819  BP: 135/83 100/73 112/75 112/81  Pulse: 95 79 76 80  Resp: 18 18 18 18   Temp: 97.5 F (36.4 C) 97.8 F (36.6 C) 97.5 F (36.4 C) 97.5 F (36.4 C)  TempSrc: Oral Oral  Oral  SpO2: 97% 99% 94% 100%  Weight:   162 lb 12.8 oz (73.8 kg)   Height:        Intake/Output Summary (Last 24 hours) at  07/22/16 0841 Last data filed at 07/22/16 0409  Gross per 24 hour  Intake              600 ml  Output              325 ml  Net              275 ml   Filed Weights   07/21/16 0907 07/22/16 0409  Weight: 167 lb (75.8 kg) 162 lb 12.8 oz (73.8 kg)    Physical Exam    GEN: Well nourished, well developed, in no acute distress.  HEENT: Grossly normal.  Neck: Supple, JVD elevated to the jaw, no carotid bruits, or masses. Cardiac: RRR, no murmurs, rubs, or gallops. No clubbing, cyanosis, edema.  Radials/DP/PT 2+ and equal bilaterally.  Respiratory:  Diminished breath sounds bilaterally with expiratory wheezing and bibasilar crackles. Dry cough noted.  GI: Soft, nontender, distended, BS + x 4. MS: no deformity or atrophy. Skin: warm and dry, no rash. Neuro:  Strength and sensation are intact. Psych: AAOx3.  Normal affect.  Labs    CBC  Recent Labs  07/21/16 0917 07/22/16 0417  WBC 20.9* 23.3*  NEUTROABS 17.6*  --   HGB 10.5* 11.0*  HCT 33.6* 34.4*  MCV 94.3 95.1  PLT 854* 235*   Basic Metabolic Panel  Recent Labs  07/21/16 0917 07/21/16 1459 07/22/16 0417  NA 140  --  139  K 3.3*  --  4.2  CL 104  --  104  CO2 25  --  21*  GLUCOSE 208*  --  129*  BUN 20  --  34*  CREATININE 1.32*  --  1.97*  CALCIUM 9.0  --  9.1  MG  --  2.1  --    Liver Function Tests  Recent Labs  07/21/16 0917  AST 34  ALT 20  ALKPHOS 207*  BILITOT 2.6*  PROT 7.7  ALBUMIN 3.9   No results for input(s): LIPASE, AMYLASE in the last 72 hours. Cardiac Enzymes  Recent Labs  07/21/16 0917 07/21/16 1459 07/21/16 2100  TROPONINI 0.10* 0.07* 0.09*   BNP Invalid input(s): POCBNP D-Dimer No results for input(s): DDIMER in the last 72 hours. Hemoglobin A1C  Recent Labs  07/21/16 1459  HGBA1C 6.0*   Fasting Lipid Panel No results for input(s): CHOL, HDL, LDLCALC, TRIG, CHOLHDL, LDLDIRECT in the last 72 hours. Thyroid Function Tests No results for input(s): TSH, T4TOTAL, T3FREE,  THYROIDAB in the last 72 hours.  Invalid input(s): FREET3  Telemetry    Sinus rhythm 80s bpm - Personally Reviewed  ECG    n/a - Personally Reviewed  Radiology    Dg Chest 2 View  Result Date: 07/21/2016 CLINICAL DATA:  Two months of generalize weakness, lightheadedness, shortness of breath, and intermittent chest pain. Chronic cough. History of leukemia, former smoker. EXAM: CHEST  2 VIEW COMPARISON:  Chest x-ray of May 02, 2016 FINDINGS: The lungs are well-expanded. There is no focal infiltrate. There is no pleural effusion. The cardiac silhouette is mildly enlarged but stable. The central pulmonary vascularity is prominent but there is no definite cephalization. There is calcification in the wall of the aortic arch. The mediastinum is normal in width. There is multilevel degenerative disc disease of the thoracic spine. IMPRESSION: Stable cardiomegaly without pulmonary edema. No pneumonia nor other acute cardiopulmonary abnormality. Thoracic aortic atherosclerosis. Electronically Signed   By: David  Martinique M.D.   On: 07/21/2016 09:55    Cardiac Studies   TTE 03/2016: Study Conclusions  - Left ventricle: The cavity size was mildly dilated. Wall   thickness was normal. Systolic function was severely reduced. The   estimated ejection fraction was in the range of 15% to 20%.   Diffuse hypokinesis. The study is not technically sufficient to   allow evaluation of LV diastolic function. - Mitral valve: There was mild regurgitation. - Left atrium: The atrium was mildly to moderately dilated. - Right ventricle: The cavity size was mildly dilated. Wall   thickness was normal. Systolic function was moderately reduced. - Right atrium: The atrium was moderately dilated. - Tricuspid valve: Mildly thickened leaflets. There was   moderate-severe regurgitation. - Pulmonic valve: There was moderate regurgitation. - Pulmonary arteries: Systolic pressure was moderately increased.   PA peak  pressure: 50 mm Hg (S).  Patient Profile     71 y.o. male with history of chronic combined CHF class III, NICM with cardiac cath in 2016 showing nonobstructive CAD, previously homeless in November and December 2017 now living in a church, CKD stage II-III, medication noncompliance, prior cocaine abuse, CVA x 2 without Afib work up, atrial tachycardia on amiodarone, HTN, ETOH abuse, and CML not currently under treatment who was recently admitted in mid December 2017 for volume overload, again in 04/2016 for same at [facility] followed by admission to [facility] in 05/2016 for  volume overload returned to [facility] with increased SOB x 1 week.  Assessment & Plan    1. Acute respiratory distress with hypoxia: -Likely multifactorial including possible AECOPD from long standing prior tobacco abuse, acute on chronic combined CHF, CML with anemia, deconditioning  -Wean oxygen as able  2. Acute on chronic combined CHF: -He does have some volume overload -Hold Lasix at this time given worsening renal function -Continue with home Toprol XL 25 mg daily, I stopped Coreg as there is no indication for 2 beta blockers -Continue spironolactone (monitor with up trending renal function) -Would change Entresto to ARB given his financial issues as he cannot afford this medication -No need to repeat echo given recent study in 03/2016 and 05/2016 -Monitor UOP, daily weights, strict Is & Os   3. Elevated troponin: -No chest pain -Consistent with prior levels in the setting of supply demand ischemia 2/2 volume overload -Recent LHC in 2016 that showed non-obstructive CAD -No plans for further ischemic evaluation at this time unless troponin trends significantly upwards -Continue ASA -Stopped Plavix as there has been no recent stenting and this was stopped at [facility] in 05/2016  4. NICM: -As above -Recent LHC in 2016 without obstructive CAD -ASA  5. Noncompliance: -Appears to be multifactorial including financial issues and  lifestyle -Per primary service  6. CML: -Appears to be following [facility] hematology  -Per IM  7. Acute on CKD stage II: -Renal function increasing -Hold Lasix -Monitor with diuresis   8. Homeless/polysubstance abuse: -Reports "a little cocaine is not bad" -Suggest he evaluate is life goals  9. AECOPD: -Steroids per IM -Consider nebs/ABX -Defer to IM  Signed, Christell Faith, PA-C [facility] Pager: [phone] 07/22/2016, 8:41 AM

## 2016-07-22 NOTE — Care Management (Signed)
CM found patient's quad cane that was left in the ED.  Patient known to this CM.  [place] in [place] between [place] and [place].  Continues to discuss how he can not control his finances and discussed contacting department for a payee.

## 2016-07-22 NOTE — Clinical Social Work Placement (Signed)
   CLINICAL SOCIAL WORK PLACEMENT  NOTE  Date:  07/22/2016  Patient Details  Name: Bruce [name] MRN: [id number] Date of Birth: Aug 26, 1945  Clinical Social Work is seeking post-discharge placement for this patient at the [place] level of care (*CSW will initial, date and re-position this form in  chart as items are completed):  Yes   Patient/family provided with [facility] Work Department's list of facilities offering this level of care within the geographic area requested by the patient (or if unable, by the patient's family).  Yes   Patient/family informed of their freedom to choose among providers that offer the needed level of care, that participate in Medicare, Medicaid or managed care program needed by the patient, have an available bed and are willing to accept the patient.  Yes   Patient/family informed of [facility]'s ownership interest in [facility] and [facility], as well as of the fact that they are under no obligation to receive care at these facilities.  PASRR submitted to EDS on       PASRR number received on       Existing PASRR number confirmed on 07/22/16     FL2 transmitted to all facilities in geographic area requested by pt/family on 07/22/16     FL2 transmitted to all facilities within larger geographic area on       Patient informed that his/her managed care company has contracts with or will negotiate with certain facilities, including the following:            Patient/family informed of bed offers received.  Patient chooses bed at       Physician recommends and patient chooses bed at      Patient to be transferred to   on  .  Patient to be transferred to facility by       Patient family notified on   of transfer.  Name of family member notified:        PHYSICIAN       Additional Comment:    _______________________________________________ Makyia Erxleben, Veronia Beets, LCSW 07/22/2016, 11:18 AM

## 2016-07-22 NOTE — Progress Notes (Signed)
[place] [facility]                                                                                                                                                                                  Patient Demographics   Bruce [name], is a 71 y.o. male, DOB - 01/14/1946, ZSW:[id number]  Admit date - 07/21/2016   Admitting Physician Demetrios Loll, MD  Outpatient Primary MD for the patient is [name], MD   LOS - 1  Subjective: Patient admitted with shortness of breath thought to initially have CHF chest x-ray negative his BUN and creatinine are increased today   He complains of cough. And dyspnea on exertion    Review of Systems:   CONSTITUTIONAL: No documented fever. No fatigue, weakness. No weight gain, no weight loss.  EYES: No blurry or double vision.  ENT: No tinnitus. No postnasal drip. No redness of the oropharynx.  RESPIRATORY:positive cough, no wheeze, no hemoptysis. positive dyspnea.  CARDIOVASCULAR: No chest pain. No orthopnea. No palpitations. No syncope.  GASTROINTESTINAL: No nausea, no vomiting or diarrhea. No abdominal pain. No melena or hematochezia.  GENITOURINARY: No dysuria or hematuria.  ENDOCRINE: No polyuria or nocturia. No heat or cold intolerance.  HEMATOLOGY: No anemia. No bruising. No bleeding.  INTEGUMENTARY: No rashes. No lesions.  MUSCULOSKELETAL: No arthritis. No swelling. No gout.  NEUROLOGIC: No numbness, tingling, or ataxia. No seizure-type activity.  PSYCHIATRIC: No anxiety. No insomnia. No ADD.    Vitals:   Vitals:   07/21/16 2014 07/22/16 0409 07/22/16 0819 07/22/16 1212  BP: 100/73 112/75 112/81 112/75  Pulse: 79 76 80 72  Resp: 18 18 18 18   Temp: 97.8 F (36.6 C) 97.5 F (36.4 C) 97.5 F (36.4 C) 98.1 F (36.7 C)  TempSrc: Oral  Oral   SpO2: 99% 94% 100% 100%  Weight:  162 lb 12.8 oz (73.8 kg)    Height:        Wt Readings from Last 3 Encounters:  07/22/16 162 lb 12.8 oz (73.8 kg)  05/07/16  163 lb 6.4 oz (74.1 kg)  04/29/16 178 lb 9.6 oz (81 kg)     Intake/Output Summary (Last 24 hours) at 07/22/16 1234 Last data filed at 07/22/16 0900  Gross per 24 hour  Intake              840 ml  Output              325 ml  Net              515 ml    Physical Exam:   GENERAL: Pleasant-appearing in no apparent distress.  HEAD, EYES, EARS,  NOSE AND THROAT: Atraumatic, normocephalic. Extraocular muscles are intact. Pupils equal and reactive to light. Sclerae anicteric. No conjunctival injection. No oro-pharyngeal erythema.  NECK: Supple. There is no jugular venous distention. No bruits, no lymphadenopathy, no thyromegaly.  HEART: Regular rate and rhythm,. No murmurs, no rubs, no clicks.  LUNGS: Decreased breath sounds bilaterally with occasional wheezing  ABDOMEN: Soft, flat, nontender, nondistended. Has good bowel sounds. No hepatosplenomegaly appreciated.  EXTREMITIES: No evidence of any cyanosis, clubbing, or peripheral edema.  +2 pedal and radial pulses bilaterally.  NEUROLOGIC: The patient is alert, awake, and oriented x3 with no focal motor or sensory deficits appreciated bilaterally.  SKIN: Moist and warm with no rashes appreciated.  Psych: Not anxious, depressed LN: No inguinal LN enlargement    Antibiotics   Anti-infectives    None      Medications   Scheduled Meds: . amiodarone  200 mg Oral Daily  . aspirin  81 mg Oral Daily  . atorvastatin  40 mg Oral Daily  . bosutinib  300 mg Oral Q breakfast  . enoxaparin (LOVENOX) injection  40 mg Subcutaneous Q24H  . insulin aspart  0-5 Units Subcutaneous QHS  . insulin aspart  0-9 Units Subcutaneous TID WC  . ipratropium-albuterol  3 mL Nebulization Q6H  . methylPREDNISolone (SOLU-MEDROL) injection  60 mg Intravenous Q12H  . metoprolol succinate  25 mg Oral Daily  . sacubitril-valsartan  1 tablet Oral BID  . sodium chloride flush  3 mL Intravenous Q12H  . sodium chloride flush  3 mL Intravenous Q12H  . spironolactone   25 mg Oral Daily  . tiotropium  18 mcg Inhalation q morning - 10a   Continuous Infusions: PRN Meds:.sodium chloride, acetaminophen **OR** acetaminophen, albuterol, bisacodyl, diphenhydrAMINE, guaiFENesin-dextromethorphan, meclizine, ondansetron **OR** ondansetron (ZOFRAN) IV, oxyCODONE-acetaminophen, senna-docusate, sodium chloride flush   Data Review:   Micro Results No results found for this or any previous visit (from the past 240 hour(s)).  Radiology Reports Dg Chest 2 View  Result Date: 07/21/2016 CLINICAL DATA:  Two months of generalize weakness, lightheadedness, shortness of breath, and intermittent chest pain. Chronic cough. History of leukemia, former smoker. EXAM: CHEST  2 VIEW COMPARISON:  Chest x-ray of May 02, 2016 FINDINGS: The lungs are well-expanded. There is no focal infiltrate. There is no pleural effusion. The cardiac silhouette is mildly enlarged but stable. The central pulmonary vascularity is prominent but there is no definite cephalization. There is calcification in the wall of the aortic arch. The mediastinum is normal in width. There is multilevel degenerative disc disease of the thoracic spine. IMPRESSION: Stable cardiomegaly without pulmonary edema. No pneumonia nor other acute cardiopulmonary abnormality. Thoracic aortic atherosclerosis. Electronically Signed   By: David  Martinique M.D.   On: 07/21/2016 09:55     CBC  Recent Labs Lab 07/21/16 0917 07/22/16 0417  WBC 20.9* 23.3*  HGB 10.5* 11.0*  HCT 33.6* 34.4*  PLT 854* 895*  MCV 94.3 95.1  MCH 29.6 30.3  MCHC 31.4* 31.8*  RDW 20.9* 21.0*  LYMPHSABS 1.2  --   MONOABS 1.8*  --   EOSABS 0.1  --   BASOSABS 0.2*  --     Chemistries   Recent Labs Lab 07/21/16 0917 07/21/16 1459 07/22/16 0417  NA 140  --  139  K 3.3*  --  4.2  CL 104  --  104  CO2 25  --  21*  GLUCOSE 208*  --  129*  BUN 20  --  34*  CREATININE 1.32*  --  1.97*  CALCIUM 9.0  --  9.1  MG  --  2.1  --   AST 34  --   --    ALT 20  --   --   ALKPHOS 207*  --   --   BILITOT 2.6*  --   --    ------------------------------------------------------------------------------------------------------------------ estimated creatinine clearance is 33.8 mL/min (A) (by C-G formula based on SCr of 1.97 mg/dL (H)). ------------------------------------------------------------------------------------------------------------------  Recent Labs  07/21/16 1459  HGBA1C 6.0*   ------------------------------------------------------------------------------------------------------------------ No results for input(s): CHOL, HDL, LDLCALC, TRIG, CHOLHDL, LDLDIRECT in the last 72 hours. ------------------------------------------------------------------------------------------------------------------ No results for input(s): TSH, T4TOTAL, T3FREE, THYROIDAB in the last 72 hours.  Invalid input(s): FREET3 ------------------------------------------------------------------------------------------------------------------ No results for input(s): VITAMINB12, FOLATE, FERRITIN, TIBC, IRON, RETICCTPCT in the last 72 hours.  Coagulation profile No results for input(s): INR, PROTIME in the last 168 hours.  No results for input(s): DDIMER in the last 72 hours.  Cardiac Enzymes  Recent Labs Lab 07/21/16 0917 07/21/16 1459 07/21/16 2100  TROPONINI 0.10* 0.07* 0.09*   ------------------------------------------------------------------------------------------------------------------ Invalid input(s): POCBNP    Assessment & Plan  Patient is a 71 year old with significant systolic dysfunction COPD  1. Acute on chronic systolic and diastolic CHF. Patient's renal function is worse and I will stop the IV Lasix  2. Possible COPD exasperation will treat with nebulizers steroids and oral antibiotics  3.CKD stage III, renal function worsens will monitor  4. Elevated troponin. Possible due to demanding ischemia.  Seen by cardiology  continue aspirin  5. CAD. Continue aspirin and the Lipitor.  6. Hypokalemia. Replaced  7. Leukocytosis due to his chronic  CML         Code Status Orders        Start     Ordered   07/21/16 1207  Full code  Continuous     07/21/16 1206    Code Status History    Date Active Date Inactive Code Status Order ID Comments User Context   05/02/2016  6:59 PM 05/07/2016 10:52 PM Full Code [id number]  Idelle Crouch, MD Inpatient   03/21/2016  6:40 PM 03/23/2016  6:39 PM Full Code [id number]  Idelle Crouch, MD Inpatient   10/10/2015 11:59 PM 10/15/2015  7:08 PM Full Code [id number]  Ivor Costa, MD ED   06/04/2015  1:54 AM 06/06/2015  5:28 PM Full Code [id number]  [name], MD ED   01/25/2015  3:16 AM 01/29/2015  5:17 PM Full Code [id number]  Lavina Hamman, MD ED   09/13/2014  5:07 PM 09/22/2014  6:09 PM Full Code [id number]  Flonnie Overman Dhungel, MD Inpatient   03/20/2014 11:33 PM 03/21/2014  5:46 PM Full Code [id number]  Arne Cleveland, MD Inpatient   03/18/2014 11:46 PM 03/20/2014 11:33 PM Full Code [id number]  [name], MD Inpatient   03/05/2014  9:04 PM 03/06/2014  6:57 PM Full Code [id number]  [name], MD Inpatient           Consults  cards  DVT Prophylaxis  Lovenox   Lab Results  Component Value Date   PLT 895 (H) 07/22/2016     Time Spent in minutes   70min  Greater than 50% of time spent in care coordination and counseling patient regarding the condition and plan of care.   Dustin Flock M.D on 07/22/2016 at 12:34 PM  Between 7am to 6pm - Pager - [phone]  After 6pm go to [email or web address] - [facility]   Office  [phone]  

## 2016-07-22 NOTE — Evaluation (Signed)
Physical Therapy Evaluation Patient Details Name: Bruce [name] MRN: [id number] DOB: April 27, 1945 Today's Date: 07/22/2016   History of Present Illness  71 y.o. male with a known history of CAD, CHF, CK D, CML and cocaine abuse. The patient presently ED with worsening shortness of breath and productive cough for several months. He was admitted to [facility] in March and was thought to have a coronary vasospasm from cocaine use, CHF exacerbation and non-STEMI.  Clinical Impression  Pt generally was safe with ambulation though he did have some stagger stepping and regularly needed to either use PT's hand for assist or rail/surface.  He also needed multiple standing rest breaks and generally was fatigued t/o the session. He reports he is not at his baseline and admits he will need more help than normal until he is stronger and has more endurance.      Follow Up Recommendations Home health PT;Supervision/Assistance - 24 hour    Equipment Recommendations   (needs new rubber base for one of QC legs)    Recommendations for Other Services       Precautions / Restrictions Precautions Precautions: Fall Restrictions Weight Bearing Restrictions: No      Mobility  Bed Mobility Overal bed mobility: Modified Independent             General bed mobility comments: Pt able to get up to sitting EOB w/o direct assist  Transfers Overall transfer level: Modified independent Equipment used: None             General transfer comment: Pt with some minimal initial unsteadiness getting to standing, once up and oriented he did well with only minimal UE use on surfaces.  Ambulation/Gait Ambulation/Gait assistance: Supervision Ambulation Distance (Feet): 225 Feet Assistive device: 1 person hand held assist       General Gait Details: Pt with inconsistent need of PTs hand to maintain balance.  He needed multiple standing rest breaks and his HR did fluctuate considerably (up to 120s) with  the effort.  He was very fatigued after ambulation and states he likely could not have done much more w/o actually sitting.    Stairs            Wheelchair Mobility    Modified Rankin (Stroke Patients Only)       Balance Overall balance assessment: Modified Independent                                           Pertinent Vitals/Pain Pain Assessment:  (minimal head ache)    Home Living Family/patient expects to be discharged to:: Skilled nursing facility                 Additional Comments: currently homeless - stays with friends, in car, churches, shelters    Prior Function Level of Independence: Independent with assistive device(s)         Comments: has quad cane, uses one at baseline     Hand Dominance        Extremity/Trunk Assessment   Upper Extremity Assessment Upper Extremity Assessment: Overall WFL for tasks assessed    Lower Extremity Assessment Lower Extremity Assessment: Overall WFL for tasks assessed       Communication   Communication: No difficulties  Cognition Arousal/Alertness: Awake/alert Behavior During Therapy: WFL for tasks assessed/performed Overall Cognitive Status: Within Functional Limits for tasks assessed  General Comments      Exercises     Assessment/Plan    PT Assessment Patient needs continued PT services  PT Problem List Decreased strength;Decreased activity tolerance;Decreased balance;Decreased safety awareness;Decreased knowledge of use of DME;Cardiopulmonary status limiting activity       PT Treatment Interventions DME instruction;Gait training;Stair training;Functional mobility training;Therapeutic activities;Therapeutic exercise;Balance training;Neuromuscular re-education;Patient/family education    PT Goals (Current goals can be found in the Care Plan section)  Acute Rehab PT Goals Patient Stated Goal: figure out where I'm  going to live PT Goal Formulation: With patient Time For Goal Achievement: 08/05/16 Potential to Achieve Goals: Fair    Frequency Min 2X/week   Barriers to discharge Decreased caregiver support;Inaccessible home environment;Other (comment) (homeless)      Co-evaluation               End of Session Equipment Utilized During Treatment: Gait belt Activity Tolerance: Patient limited by fatigue Patient left: with chair alarm set;with call bell/phone within reach   PT Visit Diagnosis: Muscle weakness (generalized) (M62.81);Difficulty in walking, not elsewhere classified (R26.2)    Time: 8264-1583 PT Time Calculation (min) (ACUTE ONLY): 15 min   Charges:   PT Evaluation $PT Eval Low Complexity: 1 Procedure     PT G Codes:        Kreg Shropshire, DPT 07/22/2016, 4:01 PM

## 2016-07-22 NOTE — Clinical Social Work Note (Signed)
Clinical Social Work Assessment  Patient Details  Name: Bruce [name] MRN: [id number] Date of Birth: 06-18-1945  Date of referral:  07/22/16               Reason for consult:  Facility Placement, [employer], Discharge Planning, Housing Concerns/Homelessness                Permission sought to share information with:  Chartered certified accountant granted to share information::  Yes, Verbal Permission Granted  Name::      [place]::   [place]   Relationship::     Contact Information:     Housing/Transportation Living arrangements for the past 2 months:  Hotel/Motel, [employer], No permanent address, [place], Homeless Source of Information:  Patient Patient Interpreter Needed:  None Criminal Activity/Legal Involvement Pertinent to Current Situation/Hospitalization:  No - Comment as needed Significant Relationships:  Other Family Members Lives with:  Self Do you feel safe going back to the place where you live?  Yes Need for family participation in patient care:  No (Coment)  Care giving concerns:  Patient reported that he has no permanent address and is homeless.    Social Worker assessment / plan:  Holiday representative (CSW) received consult for SNF placement. PT is pending. Per chart patient is homeless. CSW met with patient alone at bedside to address consult. Patient was alert and oriented X4 and was sitting up in the bed. CSW introduced self and explained role of CSW department. Patient was happy to speak to CSW and open to answering questions. Patient reported that he has stayed 3 nights at the [facility] in [place] and before that stayed 3 nights at the [facility] in [place]. Patient reported that he has been trying to get in contact with his nephew Traves Majchrzak to see if he can stay with him however he does not know [place]'s phone number or address. CSW made patient aware that Linward Foster is not listed on  his face sheet. Per patient Linward Foster has been a patient at [facility] before and had a toe amputation. CSW explained to patient that CSW nor any staff at [facility] can share patient information without permission because of [place]. Per patient Earlie Server listed on his contact list is his cousin. Patient reported that he has stayed at homeless shelters in [place] and [place]. Patient reported that his first wife Dorian Pod has passed away and his second wife he is divorced from. Patient reported that he does not use drugs or drink alcohol. Patient reported that he use to drink alcohol but has quit. Patient reported that he was going to live at a group home in [facility] however he did not want to give up all his money and only get $60 per month. CSW explained that all group homes work that way because they are providing food, shelter and transportation for patient. CSW explained SNF for short term rehab as an option. Patient reported that he would be agreeable to go to SNF for short term but not forever. CSW exaplined that per chart on 05/07/16 CSW tried to place patient at [facility] however patient refused to go and discharged to homeless shelter. Patient reported that he would be willing to go this time. CSW provided patient with list of [employer]. FL2 complete and faxed out. CSW will continue to follow and assist as needed.   Employment status:  Contractor  PT Recommendations:  Not assessed at this time Information / Referral to community resources:  [place]  Patient/Family's Response to care:  Patient is agreeable to SNF search in [place].   Patient/Family's Understanding of and Emotional Response to Diagnosis, Current Treatment, and Prognosis: Patient was pleasant and thanked CSW for assistance.   Emotional Assessment Appearance:  Appears younger than stated age Attitude/Demeanor/Rapport:    Affect (typically  observed):  Accepting, Stable, Adaptable, Happy, Hopeful, Appropriate, Pleasant Orientation:  Oriented to Self, Oriented to Place, Oriented to  Time, Oriented to Situation Alcohol / Substance use:  Not Applicable Psych involvement (Current and /or in the community):  No (Comment)  Discharge Needs  Concerns to be addressed:  Discharge Planning Concerns Readmission within the last 30 days:  No Current discharge risk:  Chronically ill, Homeless, Inadequate Financial Supports Barriers to Discharge:  Continued Medical Work up   [employer], [employer], LCSW 07/22/2016, 11:20 AM

## 2016-07-23 ENCOUNTER — Inpatient Hospital Stay: Payer: Medicare Other

## 2016-07-23 LAB — BASIC METABOLIC PANEL
Anion gap: 14 (ref 5–15)
BUN: 61 mg/dL — AB (ref 6–20)
CALCIUM: 8.3 mg/dL — AB (ref 8.9–10.3)
CO2: 18 mmol/L — ABNORMAL LOW (ref 22–32)
Chloride: 104 mmol/L (ref 101–111)
Creatinine, Ser: 3.14 mg/dL — ABNORMAL HIGH (ref 0.61–1.24)
GFR calc Af Amer: 22 mL/min — ABNORMAL LOW (ref >60)
GFR, EST NON AFRICAN AMERICAN: 19 mL/min — AB (ref >60)
Glucose, Bld: 156 mg/dL — ABNORMAL HIGH (ref 65–99)
Potassium: 4.9 mmol/L (ref 3.5–5.1)
SODIUM: 136 mmol/L (ref 135–145)

## 2016-07-23 LAB — GLUCOSE, CAPILLARY
GLUCOSE-CAPILLARY: 167 mg/dL — AB (ref 65–99)
Glucose-Capillary: 185 mg/dL — ABNORMAL HIGH (ref 65–99)
Glucose-Capillary: 230 mg/dL — ABNORMAL HIGH (ref 65–99)
Glucose-Capillary: 263 mg/dL — ABNORMAL HIGH (ref 65–99)
Glucose-Capillary: 285 mg/dL — ABNORMAL HIGH (ref 65–99)

## 2016-07-23 MED ORDER — SODIUM CHLORIDE 0.9 % IV SOLN
INTRAVENOUS | Status: DC
  Administered 2016-07-23: 09:00:00 via INTRAVENOUS

## 2016-07-23 MED ORDER — GUAIFENESIN-CODEINE 100-10 MG/5ML PO SOLN
10.0000 mL | Freq: Four times a day (QID) | ORAL | Status: DC | PRN
  Administered 2016-07-23 – 2016-07-27 (×4): 10 mL via ORAL
  Filled 2016-07-23 (×4): qty 10

## 2016-07-23 MED ORDER — ENOXAPARIN SODIUM 30 MG/0.3ML ~~LOC~~ SOLN
30.0000 mg | SUBCUTANEOUS | Status: DC
  Administered 2016-07-23 – 2016-07-24 (×2): 30 mg via SUBCUTANEOUS
  Filled 2016-07-23 (×2): qty 0.3

## 2016-07-23 NOTE — Progress Notes (Signed)
Inpatient Diabetes Program Recommendations  AACE/ADA: New Consensus Statement on Inpatient Glycemic Control (2015)  Target Ranges:  Prepandial:   less than 140 mg/dL      Peak postprandial:   less than 180 mg/dL (1-2 hours)      Critically ill patients:  140 - 180 mg/dL   Results for AUTREY, HUMAN (MRN [id number]) as of 07/23/2016 13:10  Ref. Range 07/22/2016 08:11 07/22/2016 12:14 07/22/2016 17:26 07/22/2016 21:03 07/22/2016 21:21 07/23/2016 07:48 07/23/2016 09:15 07/23/2016 11:43  Glucose-Capillary Latest Ref Range: 65 - 99 mg/dL 124 (H) 220 (H) 206 (H) 214 (H) 204 (H) 167 (H) 185 (H) 230 (H)    Review of Glycemic Control  Diabetes history: DM2 Outpatient Diabetes medications: Metformin 500 mg BID Current orders for Inpatient glycemic control: Novolog 0-9 units TID with meals, Novolog 0-5 units QHS  Inpatient Diabetes Program Recommendations: Insulin - Meal Coverage: If steroids are continued, please consider ordering Novolog 3 units TID with meals for meal coverage.  Thanks, Barnie Alderman, RN, MSN, CDE Diabetes Coordinator Inpatient Diabetes Program [phone] (Team Pager from 8am to 5pm)

## 2016-07-23 NOTE — Progress Notes (Signed)
Plan is for patient to D/C to [facility] when medically stable. Per [facility] admissions coordinator at [facility] patient can come to [place] over the weekend if medically stable. Clinical Social Worker (CSW) will continue to follow and assist as needed.   [employer], LCSW [phone]

## 2016-07-23 NOTE — Progress Notes (Signed)
Anticoagulation monitoring(Lovenox):  70yo  M ordered Lovenox 40 mg Q24h  Filed Weights   07/21/16 0907 07/22/16 0409 07/23/16 0441  Weight: 167 lb (75.8 kg) 162 lb 12.8 oz (73.8 kg) 164 lb 11.2 oz (74.7 kg)   BMI 25.4   Lab Results  Component Value Date   CREATININE 3.14 (H) 07/23/2016   CREATININE 1.97 (H) 07/22/2016   CREATININE 1.32 (H) 07/21/2016   Estimated Creatinine Clearance: 21.2 mL/min (A) (by C-G formula based on SCr of 3.14 mg/dL (H)). Hemoglobin & Hematocrit     Component Value Date/Time   HGB 11.0 (L) 07/22/2016 0417   HGB 10.3 (L) 07/31/2014 1244   HCT 34.4 (L) 07/22/2016 0417   HCT 32.9 (L) 07/31/2014 1244     Per Protocol for Patient with estCrcl < 30 ml/min and BMI < 40, will transition to Lovenox 30 mg Q24h     Chinita Greenland PharmD Clinical Pharmacist 07/23/2016

## 2016-07-23 NOTE — Consult Note (Signed)
[facility]  CONSULT NOTE    Date: 07/23/2016                  Patient Name:  Bruce [name]  MRN: [id number]  DOB: 22-Jul-1945  Age / Sex: 71 y.o., male         PCP: [name], MD                 Service Requesting Consult: Dr. Posey Pronto                 Reason for Consult: Acute renal failure            History of Present Illness: Bruce [name] is a 71 y.o. black male with alcoholism, systolic congestive heart failure, CVA, hypertension, coronary artery disease, hyperlipidemia, who was admitted to [facility] on 07/21/2016 for Elevated troponin [R74.8] COPD exacerbation ([place]) [A35.5] Systolic congestive heart failure, unspecified congestive heart failure chronicity ([place]) [I50.20]  Patient is homeless. Patient was given high does furosemide from 4/11 to yesterday. Creatinine on admission 1.32, creatinine 3.14   Medications: Outpatient medications: Prescriptions Prior to Admission  Medication Sig Dispense Refill Last Dose  . acetaminophen (TYLENOL) 325 MG tablet Take 2 tablets (650 mg total) by mouth every 4 (four) hours as needed for headache or mild pain. 60 tablet 3 prn at prn  . albuterol (PROVENTIL HFA;VENTOLIN HFA) 108 (90 BASE) MCG/ACT inhaler Inhale 2 puffs into the lungs every 4 (four) hours as needed for wheezing or shortness of breath. 1 Inhaler 3 prn at prn  . aspirin 81 MG chewable tablet Chew 1 tablet (81 mg total) by mouth daily. 30 tablet 0 07/19/2016  . atorvastatin (LIPITOR) 40 MG tablet Take 1 tablet (40 mg total) by mouth daily. 30 tablet 5 07/19/2016 at Unknown time  . bosutinib (BOSULIF) 100 MG tablet Take 300 mg by mouth daily with breakfast. Take with food.   07/19/2016 at am  . carvedilol (COREG) 6.25 MG tablet Take 1 tablet (6.25 mg total) by mouth 2 (two) times daily. 60 tablet 6 07/19/2016  . clopidogrel (PLAVIX) 75 MG tablet Take 1 tablet (75 mg total) by mouth daily. 30 tablet 3 Past Week at Unknown time  . ENTRESTO 24-26 MG Take 1  tablet by mouth 2 (two) times daily.  0 07/19/2016  . furosemide (LASIX) 40 MG tablet Take 40 mg by mouth daily.   0 07/19/2016  . metFORMIN (GLUCOPHAGE) 500 MG tablet Take 500 mg by mouth 2 (two) times daily with a meal.   07/19/2016  . metoprolol succinate (TOPROL-XL) 25 MG 24 hr tablet Take 25 mg by mouth daily.   07/19/2016  . spironolactone (ALDACTONE) 25 MG tablet Take 1 tablet (25 mg total) by mouth daily. 30 tablet 6 07/19/2016  . tiotropium (SPIRIVA) 18 MCG inhalation capsule Place 18 mcg into inhaler and inhale daily.   07/19/2016  . amiodarone (PACERONE) 200 MG tablet Take 1 tablet (200 mg total) by mouth daily. (Patient not taking: Reported on 05/02/2016) 30 tablet 5 Not Taking at Unknown time  . meclizine (ANTIVERT) 25 MG tablet Take 1 tablet (25 mg total) by mouth 3 (three) times daily as needed for dizziness. (Patient not taking: Reported on 07/21/2016) 30 tablet 0 Not Taking at Unknown time    Current medications: Current Facility-Administered Medications  Medication Dose Route Frequency Provider Last Rate Last Dose  . 0.9 %  sodium chloride infusion  250 mL Intravenous PRN Demetrios Loll, MD      .  acetaminophen (TYLENOL) tablet 650 mg  650 mg Oral Q6H PRN Demetrios Loll, MD       Or  . acetaminophen (TYLENOL) suppository 650 mg  650 mg Rectal Q6H PRN Demetrios Loll, MD      . albuterol (PROVENTIL) (2.5 MG/3ML) 0.083% nebulizer solution 2.5 mg  2.5 mg Nebulization Q2H PRN Demetrios Loll, MD   2.5 mg at 07/23/16 0507  . amiodarone (PACERONE) tablet 200 mg  200 mg Oral Daily Demetrios Loll, MD   200 mg at 07/23/16 0917  . aspirin chewable tablet 81 mg  81 mg Oral Daily Demetrios Loll, MD   81 mg at 07/23/16 8413  . atorvastatin (LIPITOR) tablet 40 mg  40 mg Oral Daily Demetrios Loll, MD   40 mg at 07/23/16 0917  . azithromycin (ZITHROMAX) tablet 500 mg  500 mg Oral Daily Dustin Flock, MD   500 mg at 07/23/16 0917  . bisacodyl (DULCOLAX) EC tablet 5 mg  5 mg Oral Daily PRN Demetrios Loll, MD      . bosutinib (BOSULIF) tablet 300  mg  300 mg Oral Q breakfast Demetrios Loll, MD   300 mg at 07/21/16 1723  . diphenhydrAMINE (BENADRYL) capsule 25 mg  25 mg Oral Q6H PRN Demetrios Loll, MD   25 mg at 07/21/16 1723  . enoxaparin (LOVENOX) injection 30 mg  30 mg Subcutaneous Q24H Demetrios Loll, MD      . guaiFENesin-codeine 100-10 MG/5ML solution 10 mL  10 mL Oral Q6H PRN Dustin Flock, MD   10 mL at 07/23/16 0926  . insulin aspart (novoLOG) injection 0-5 Units  0-5 Units Subcutaneous QHS Demetrios Loll, MD   2 Units at 07/22/16 2200  . insulin aspart (novoLOG) injection 0-9 Units  0-9 Units Subcutaneous TID WC Demetrios Loll, MD   2 Units at 07/23/16 [phone]  . ipratropium-albuterol (DUONEB) 0.5-2.5 (3) MG/3ML nebulizer solution 3 mL  3 mL Nebulization Q6H Dustin Flock, MD   3 mL at 07/23/16 0841  . meclizine (ANTIVERT) tablet 25 mg  25 mg Oral TID PRN Demetrios Loll, MD      . methylPREDNISolone sodium succinate (SOLU-MEDROL) 125 mg/2 mL injection 60 mg  60 mg Intravenous Q12H Dustin Flock, MD   60 mg at 07/23/16 0917  . metoprolol succinate (TOPROL-XL) 24 hr tablet 25 mg  25 mg Oral Daily Demetrios Loll, MD   25 mg at 07/23/16 0917  . ondansetron (ZOFRAN) tablet 4 mg  4 mg Oral Q6H PRN Demetrios Loll, MD   4 mg at 07/22/16 0830   Or  . ondansetron (ZOFRAN) injection 4 mg  4 mg Intravenous Q6H PRN Demetrios Loll, MD   4 mg at 07/23/16 0917  . oxyCODONE-acetaminophen (PERCOCET/ROXICET) 5-325 MG per tablet 1 tablet  1 tablet Oral Q6H PRN Dustin Flock, MD   1 tablet at 07/23/16 0925  . senna-docusate (Senokot-S) tablet 1 tablet  1 tablet Oral QHS PRN Demetrios Loll, MD      . sodium chloride flush (NS) 0.9 % injection 3 mL  3 mL Intravenous Q12H Demetrios Loll, MD   3 mL at 07/22/16 2210  . sodium chloride flush (NS) 0.9 % injection 3 mL  3 mL Intravenous Q12H Demetrios Loll, MD   3 mL at 07/23/16 0918  . sodium chloride flush (NS) 0.9 % injection 3 mL  3 mL Intravenous PRN Demetrios Loll, MD      . tiotropium [facility]) inhalation capsule 18 mcg  18 mcg Inhalation q morning - 10a Demetrios Loll,  MD   18 mcg at 07/23/16 7322      Allergies: No Known Allergies    Past Medical History: Past Medical History:  Diagnosis Date  . Bell's palsy   . Chronic combined systolic and diastolic CHF, NYHA class 3 ([place])   . CKD (chronic kidney disease), stage II   . CML (chronic myelocytic leukemia) ([place])   . Coronary artery disease, non-occlusive   . Hypercholesterolemia   . Hypertension   . Leukemia ([place])   . Stroke ([place])    No residual limb weakness.  Walks with cane at baseline.   Marland Kitchen TIA (transient ischemic attack) 05/10/2014     Past Surgical History: Past Surgical History:  Procedure Laterality Date  . BACK SURGERY    . HIP ARTHROPLASTY Right    orif  . ORIF FOREARM FRACTURE Right      Family History: Family History  Problem Relation Age of Onset  . Diabetes Mother   . Hypertension Mother   . Diabetes Father   . Hypertension Father   . Diabetes Brother   . Hypertension Brother   . Diabetes Sister   . Hypertension Sister   . Diabetes Brother   . Hypertension Brother   . Diabetes Sister   . Hypertension Sister      Social History: Social History   Social History  . Marital status: Widowed    Spouse name: N/A  . Number of children: 0  . Years of education: N/A   Occupational History  . retired    Social History Main Topics  . Smoking status: Former Smoker    Packs/day: 0.50    Years: 50.00    Types: Cigarettes  . Smokeless tobacco: Never Used  . Alcohol use No  . Drug use: Yes  . Sexual activity: No   Other Topics Concern  . Not on file   Social History Narrative   Patient is right handed.   Patient drinks 1-2 cups daily.     Review of Systems: ROS  Vital Signs: Blood pressure 112/71, pulse 71, temperature 97.3 F (36.3 C), temperature source Oral, resp. rate 16, height 5\' 8"  (1.727 m), weight 74.7 kg (164 lb 11.2 oz), SpO2 97 %.  Weight trends: Filed Weights   07/21/16 0907 07/22/16 0409 07/23/16 0441  Weight: 75.8 kg (167 lb)  73.8 kg (162 lb 12.8 oz) 74.7 kg (164 lb 11.2 oz)    Physical Exam: General: NAD, laying in bed  Head: Normocephalic, atraumatic. Moist oral mucosal membranes  Eyes: Anicteric, PERRL  Neck: Supple, trachea midline  Lungs:  Bilateral crackles  Heart: Regular rate and rhythm  Abdomen:  Soft, nontender,   Extremities: no peripheral edema.  Neurologic: Nonfocal, moving all four extremities  Skin: No lesions        Lab results: Basic Metabolic Panel:  Recent Labs Lab 07/21/16 0917 07/21/16 1459 07/22/16 0417 07/23/16 0605  NA 140  --  139 136  K 3.3*  --  4.2 4.9  CL 104  --  104 104  CO2 25  --  21* 18*  GLUCOSE 208*  --  129* 156*  BUN 20  --  34* 61*  CREATININE 1.32*  --  1.97* 3.14*  CALCIUM 9.0  --  9.1 8.3*  MG  --  2.1  --   --     Liver Function Tests:  Recent Labs Lab 07/21/16 0917  AST 34  ALT 20  ALKPHOS 207*  BILITOT 2.6*  PROT 7.7  ALBUMIN 3.9   No results for input(s): LIPASE, AMYLASE in the last 168 hours. No results for input(s): AMMONIA in the last 168 hours.  CBC:  Recent Labs Lab 07/21/16 0917 07/22/16 0417  WBC 20.9* 23.3*  NEUTROABS 17.6*  --   HGB 10.5* 11.0*  HCT 33.6* 34.4*  MCV 94.3 95.1  PLT 854* 895*    Cardiac Enzymes:  Recent Labs Lab 07/21/16 0917 07/21/16 1459 07/21/16 2100  TROPONINI 0.10* 0.07* 0.09*    BNP: Invalid input(s): POCBNP  CBG:  Recent Labs Lab 07/22/16 1726 07/22/16 2103 07/22/16 2121 07/23/16 0748 07/23/16 0915  GLUCAP 206* 214* 204* 167* 185*    Microbiology: Results for orders placed or performed during the hospital encounter of 03/27/16  Culture, blood (Routine x 2)     Status: None   Collection Time: 03/27/16  5:35 PM  Result Value Ref Range Status   Specimen Description BLOOD LEFT ANTECUBITAL  Final   Special Requests BOTTLES DRAWN AEROBIC AND ANAEROBIC 5CC  Final   Culture NO GROWTH 5 DAYS  Final   Report Status 04/01/2016 FINAL  Final  Culture, blood (Routine x 2)      Status: None   Collection Time: 03/27/16  5:55 PM  Result Value Ref Range Status   Specimen Description BLOOD RIGHT ANTECUBITAL  Final   Special Requests BOTTLES DRAWN AEROBIC AND ANAEROBIC 5CC  Final   Culture NO GROWTH 5 DAYS  Final   Report Status 04/01/2016 FINAL  Final  Urine culture     Status: Abnormal   Collection Time: 03/27/16  6:05 PM  Result Value Ref Range Status   Specimen Description URINE, RANDOM  Final   Special Requests NONE  Final   Culture <10,000 COLONIES/mL INSIGNIFICANT GROWTH (A)  Final   Report Status 03/29/2016 FINAL  Final    Coagulation Studies: No results for input(s): LABPROT, INR in the last 72 hours.  Urinalysis: No results for input(s): COLORURINE, LABSPEC, PHURINE, GLUCOSEU, HGBUR, BILIRUBINUR, KETONESUR, PROTEINUR, UROBILINOGEN, NITRITE, LEUKOCYTESUR in the last 72 hours.  Invalid input(s): APPERANCEUR    Imaging:  No results found.   Assessment & Plan: Bruce [name] is a 71 y.o. black male with alcoholism, systolic congestive heart failure, CVA, hypertension, coronary artery disease, hyperlipidemia, who was admitted to [facility] on 07/21/2016  1. Acute renal failure: acute renal failure secondary to acute cardiorenal syndrome and overdiuresis Baseline creatinine 1.2. Creatinine fluctuates with volume status.  - hold diuretics   2. Acute exacerbation of systolic congestive heart failure: with crackles on examination.  - Check Chest X-ray  3. COPD acute exacerbation:  - steroids and supportive care.   4. Hypertension: blood pressure at goal.    LOS: 2 Shacora Zynda 4/13/201810:35 AM

## 2016-07-23 NOTE — Progress Notes (Signed)
Advanced care plan.  Purpose of the Encounter: CODE STATUS  Parties in Attendance: Patient himself  Patient's Decision Capacity: Intact  Subjective/Patient's story: Patient is 71 year old with systolic chf presents with sob    Objective/Medical story Discuss with patient regarding code status and current medical prognosis recommend DNR   Goals of care determination: full code    CODE STATUS: pt would like to remain full code   Time spent discussing advanced care planning: 15 minutes

## 2016-07-23 NOTE — Progress Notes (Signed)
[place] at [facility]                                                                                                                                                                                  Patient Demographics   Bruce [name], is a 71 y.o. male, DOB - Jul 31, 1945, WEX:[id number]  Admit date - 07/21/2016   Admitting Physician Demetrios Loll, MD  Outpatient Primary MD for the patient is [name], MD   LOS - 2  Subjective: Patient renal function is worse today. He continues to cough of cough and congestion    Review of Systems:   CONSTITUTIONAL: No documented fever. No fatigue, weakness. No weight gain, no weight loss.  EYES: No blurry or double vision.  ENT: No tinnitus. No postnasal drip. No redness of the oropharynx.  RESPIRATORY:positive cough, no wheeze, no hemoptysis. positive dyspnea.  CARDIOVASCULAR: No chest pain. No orthopnea. No palpitations. No syncope.  GASTROINTESTINAL: No nausea, no vomiting or diarrhea. No abdominal pain. No melena or hematochezia.  GENITOURINARY: No dysuria or hematuria.  ENDOCRINE: No polyuria or nocturia. No heat or cold intolerance.  HEMATOLOGY: No anemia. No bruising. No bleeding.  INTEGUMENTARY: No rashes. No lesions.  MUSCULOSKELETAL: No arthritis. No swelling. No gout.  NEUROLOGIC: No numbness, tingling, or ataxia. No seizure-type activity.  PSYCHIATRIC: No anxiety. No insomnia. No ADD.    Vitals:   Vitals:   07/23/16 0441 07/23/16 0756 07/23/16 0927 07/23/16 1108  BP:  112/71  107/70  Pulse:  71  67  Resp:  16  18  Temp:    97.9 F (36.6 C)  TempSrc:      SpO2:  (!) 89% 97% 94%  Weight: 164 lb 11.2 oz (74.7 kg)     Height:        Wt Readings from Last 3 Encounters:  07/23/16 164 lb 11.2 oz (74.7 kg)  05/07/16 163 lb 6.4 oz (74.1 kg)  04/29/16 178 lb 9.6 oz (81 kg)     Intake/Output Summary (Last 24 hours) at 07/23/16 1304 Last data filed at 07/23/16 1004  Gross per 24 hour  Intake               360 ml  Output              150 ml  Net              210 ml    Physical Exam:   GENERAL: Pleasant-appearing in no apparent distress.  HEAD, EYES, EARS, NOSE AND THROAT: Atraumatic, normocephalic. Extraocular muscles are intact. Pupils equal and reactive to light. Sclerae anicteric. No conjunctival injection. No oro-pharyngeal erythema.  NECK:  Supple. There is no jugular venous distention. No bruits, no lymphadenopathy, no thyromegaly.  HEART: Regular rate and rhythm,. No murmurs, no rubs, no clicks.  LUNGS: Decreased breath sounds bilaterally with occasional wheezing  ABDOMEN: Soft, flat, nontender, nondistended. Has good bowel sounds. No hepatosplenomegaly appreciated.  EXTREMITIES: No evidence of any cyanosis, clubbing, or peripheral edema.  +2 pedal and radial pulses bilaterally.  NEUROLOGIC: The patient is alert, awake, and oriented x3 with no focal motor or sensory deficits appreciated bilaterally.  SKIN: Moist and warm with no rashes appreciated.  Psych: Not anxious, depressed LN: No inguinal LN enlargement    Antibiotics   Anti-infectives    Start     Dose/Rate Route Frequency Ordered Stop   07/22/16 1400  azithromycin (ZITHROMAX) tablet 500 mg     500 mg Oral Daily 07/22/16 1239        Medications   Scheduled Meds: . amiodarone  200 mg Oral Daily  . aspirin  81 mg Oral Daily  . atorvastatin  40 mg Oral Daily  . azithromycin  500 mg Oral Daily  . bosutinib  300 mg Oral Q breakfast  . enoxaparin (LOVENOX) injection  30 mg Subcutaneous Q24H  . insulin aspart  0-5 Units Subcutaneous QHS  . insulin aspart  0-9 Units Subcutaneous TID WC  . ipratropium-albuterol  3 mL Nebulization Q6H  . methylPREDNISolone (SOLU-MEDROL) injection  60 mg Intravenous Q12H  . metoprolol succinate  25 mg Oral Daily  . sodium chloride flush  3 mL Intravenous Q12H  . sodium chloride flush  3 mL Intravenous Q12H  . tiotropium  18 mcg Inhalation q morning - 10a   Continuous  Infusions: PRN Meds:.sodium chloride, acetaminophen **OR** acetaminophen, albuterol, bisacodyl, diphenhydrAMINE, guaiFENesin-codeine, meclizine, ondansetron **OR** ondansetron (ZOFRAN) IV, oxyCODONE-acetaminophen, senna-docusate, sodium chloride flush   Data Review:   Micro Results No results found for this or any previous visit (from the past 240 hour(s)).  Radiology Reports Dg Chest 2 View  Result Date: 07/23/2016 CLINICAL DATA:  CHF, chronic cough. EXAM: CHEST  2 VIEW COMPARISON:  07/21/2016 FINDINGS: Cardiomegaly. No confluent opacities, effusions or edema. No acute bony abnormality. IMPRESSION: Cardiomegaly.  No active disease. Electronically Signed   By: Rolm Baptise M.D.   On: 07/23/2016 10:45   Dg Chest 2 View  Result Date: 07/21/2016 CLINICAL DATA:  Two months of generalize weakness, lightheadedness, shortness of breath, and intermittent chest pain. Chronic cough. History of leukemia, former smoker. EXAM: CHEST  2 VIEW COMPARISON:  Chest x-ray of May 02, 2016 FINDINGS: The lungs are well-expanded. There is no focal infiltrate. There is no pleural effusion. The cardiac silhouette is mildly enlarged but stable. The central pulmonary vascularity is prominent but there is no definite cephalization. There is calcification in the wall of the aortic arch. The mediastinum is normal in width. There is multilevel degenerative disc disease of the thoracic spine. IMPRESSION: Stable cardiomegaly without pulmonary edema. No pneumonia nor other acute cardiopulmonary abnormality. Thoracic aortic atherosclerosis. Electronically Signed   By: David  Martinique M.D.   On: 07/21/2016 09:55     CBC  Recent Labs Lab 07/21/16 0917 07/22/16 0417  WBC 20.9* 23.3*  HGB 10.5* 11.0*  HCT 33.6* 34.4*  PLT 854* 895*  MCV 94.3 95.1  MCH 29.6 30.3  MCHC 31.4* 31.8*  RDW 20.9* 21.0*  LYMPHSABS 1.2  --   MONOABS 1.8*  --   EOSABS 0.1  --   BASOSABS 0.2*  --     Chemistries   Recent  Labs Lab  07/21/16 0917 07/21/16 1459 07/22/16 0417 07/23/16 0605  NA 140  --  139 136  K 3.3*  --  4.2 4.9  CL 104  --  104 104  CO2 25  --  21* 18*  GLUCOSE 208*  --  129* 156*  BUN 20  --  34* 61*  CREATININE 1.32*  --  1.97* 3.14*  CALCIUM 9.0  --  9.1 8.3*  MG  --  2.1  --   --   AST 34  --   --   --   ALT 20  --   --   --   ALKPHOS 207*  --   --   --   BILITOT 2.6*  --   --   --    ------------------------------------------------------------------------------------------------------------------ estimated creatinine clearance is 21.2 mL/min (A) (by C-G formula based on SCr of 3.14 mg/dL (H)). ------------------------------------------------------------------------------------------------------------------  Recent Labs  07/21/16 1459  HGBA1C 6.0*   ------------------------------------------------------------------------------------------------------------------ No results for input(s): CHOL, HDL, LDLCALC, TRIG, CHOLHDL, LDLDIRECT in the last 72 hours. ------------------------------------------------------------------------------------------------------------------ No results for input(s): TSH, T4TOTAL, T3FREE, THYROIDAB in the last 72 hours.  Invalid input(s): FREET3 ------------------------------------------------------------------------------------------------------------------ No results for input(s): VITAMINB12, FOLATE, FERRITIN, TIBC, IRON, RETICCTPCT in the last 72 hours.  Coagulation profile No results for input(s): INR, PROTIME in the last 168 hours.  No results for input(s): DDIMER in the last 72 hours.  Cardiac Enzymes  Recent Labs Lab 07/21/16 0917 07/21/16 1459 07/21/16 2100  TROPONINI 0.10* 0.07* 0.09*   ------------------------------------------------------------------------------------------------------------------ Invalid input(s): POCBNP    Assessment & Plan  Patient is a 71 year old with significant systolic dysfunction COPD  1. Acute on chronic  systolic and diastolic CHF. Patient's renal function is worse Lasix has been started stopped he will receive some IV fluids  2. Possible COPD exasperation continue nebulizers steroids and oral antibiotics  3.acute on chronic CKD stage III, renal function worsens appreciate nephrology input His entresto has been stopped  4. Elevated troponin. Possible due to demanding ischemia.  Seen by cardiology continue aspirin  5. CAD. Continue aspirin and the Lipitor.  6. Hypokalemia. Replaced  7. Leukocytosis due to his chronic  CML         Code Status Orders        Start     Ordered   07/21/16 1207  Full code  Continuous     07/21/16 1206    Code Status History    Date Active Date Inactive Code Status Order ID Comments User Context   05/02/2016  6:59 PM 05/07/2016 10:52 PM Full Code [id number]  Idelle Crouch, MD Inpatient   03/21/2016  6:40 PM 03/23/2016  6:39 PM Full Code [id number]  Idelle Crouch, MD Inpatient   10/10/2015 11:59 PM 10/15/2015  7:08 PM Full Code [id number]  Ivor Costa, MD ED   06/04/2015  1:54 AM 06/06/2015  5:28 PM Full Code [id number]  [name], MD ED   01/25/2015  3:16 AM 01/29/2015  5:17 PM Full Code [id number]  Lavina Hamman, MD ED   09/13/2014  5:07 PM 09/22/2014  6:09 PM Full Code [id number]  Flonnie Overman Dhungel, MD Inpatient   03/20/2014 11:33 PM 03/21/2014  5:46 PM Full Code [id number]  Arne Cleveland, MD Inpatient   03/18/2014 11:46 PM 03/20/2014 11:33 PM Full Code [id number]  [name], MD Inpatient   03/05/2014  9:04 PM 03/06/2014  6:57 PM Full Code [id number]  [name], MD Inpatient  Consults  cards  DVT Prophylaxis  Lovenox   Lab Results  Component Value Date   PLT 895 (H) 07/22/2016     Time Spent in minutes   24min  Greater than 50% of time spent in care coordination and counseling patient regarding the condition and plan of care.   Dustin Flock M.D on 07/23/2016 at 1:04 PM  Between 7am to 6pm - Pager - [phone]  After  6pm go to [email or web address] - password [email or web address] [facility]   Office  [phone]

## 2016-07-23 NOTE — Plan of Care (Signed)
Problem: Safety: Goal: Ability to remain free from injury will improve Outcome: Progressing Patient has been free of falls, fall precautions are in place including the bed alarm. Patient encouraged to call for assistance before ambulating, verbalized understanding.

## 2016-07-23 NOTE — Care Management Important Message (Signed)
Important Message  Patient Details  Name: Bruce [name] MRN: [id number] Date of Birth: March 09, 1946   Medicare Important Message Given:  Yes Signed IM notice given   Katrina Stack, RN 07/23/2016, 11:58 AM

## 2016-07-24 LAB — GLUCOSE, CAPILLARY
GLUCOSE-CAPILLARY: 204 mg/dL — AB (ref 65–99)
GLUCOSE-CAPILLARY: 127 mg/dL — AB (ref 65–99)
Glucose-Capillary: 210 mg/dL — ABNORMAL HIGH (ref 65–99)
Glucose-Capillary: 124 mg/dL — ABNORMAL HIGH (ref 65–99)

## 2016-07-24 LAB — RENAL FUNCTION PANEL
Albumin: 3.7 g/dL (ref 3.5–5.0)
Anion gap: 18 — ABNORMAL HIGH (ref 5–15)
BUN: 82 mg/dL — AB (ref 6–20)
CHLORIDE: 100 mmol/L — AB (ref 101–111)
CO2: 14 mmol/L — ABNORMAL LOW (ref 22–32)
CREATININE: 4.31 mg/dL — AB (ref 0.61–1.24)
Calcium: 7.4 mg/dL — ABNORMAL LOW (ref 8.9–10.3)
GFR calc Af Amer: 15 mL/min — ABNORMAL LOW (ref >60)
GFR, EST NON AFRICAN AMERICAN: 13 mL/min — AB (ref >60)
Glucose, Bld: 245 mg/dL — ABNORMAL HIGH (ref 65–99)
Phosphorus: 5.6 mg/dL — ABNORMAL HIGH (ref 2.5–4.6)
Potassium: 5.8 mmol/L — ABNORMAL HIGH (ref 3.5–5.1)
Sodium: 132 mmol/L — ABNORMAL LOW (ref 135–145)

## 2016-07-24 LAB — BASIC METABOLIC PANEL
ANION GAP: 16 — AB (ref 5–15)
BUN: 83 mg/dL — AB (ref 6–20)
CALCIUM: 7.4 mg/dL — AB (ref 8.9–10.3)
CO2: 15 mmol/L — ABNORMAL LOW (ref 22–32)
Chloride: 100 mmol/L — ABNORMAL LOW (ref 101–111)
Creatinine, Ser: 4.24 mg/dL — ABNORMAL HIGH (ref 0.61–1.24)
GFR calc Af Amer: 15 mL/min — ABNORMAL LOW (ref >60)
GFR, EST NON AFRICAN AMERICAN: 13 mL/min — AB (ref >60)
Glucose, Bld: 245 mg/dL — ABNORMAL HIGH (ref 65–99)
POTASSIUM: 5.8 mmol/L — AB (ref 3.5–5.1)
SODIUM: 131 mmol/L — AB (ref 135–145)

## 2016-07-24 MED ORDER — SODIUM POLYSTYRENE SULFONATE 15 GM/60ML PO SUSP
30.0000 g | Freq: Once | ORAL | Status: AC
  Administered 2016-07-24: 30 g via ORAL
  Filled 2016-07-24: qty 120

## 2016-07-24 MED ORDER — IPRATROPIUM-ALBUTEROL 0.5-2.5 (3) MG/3ML IN SOLN
3.0000 mL | Freq: Three times a day (TID) | RESPIRATORY_TRACT | Status: DC
  Administered 2016-07-25: 3 mL via RESPIRATORY_TRACT
  Filled 2016-07-24: qty 3

## 2016-07-24 NOTE — Progress Notes (Signed)
[facility]  ROUNDING NOTE   Subjective:   Holding diuretics and IV fluids.  Breathing well. No complaints.   Objective:  Vital signs in last 24 hours:  Temp:  [97.5 F (36.4 C)-97.9 F (36.6 C)] 97.5 F (36.4 C) (04/14 0501) Pulse Rate:  [67-72] 72 (04/14 0501) Resp:  [18] 18 (04/14 0501) BP: (103-107)/(70-75) 105/73 (04/14 0501) SpO2:  [94 %-100 %] 99 % (04/14 0828) Weight:  [79.2 kg (174 lb 8 oz)] 79.2 kg (174 lb 8 oz) (04/14 0501)  Weight change: 4.445 kg (9 lb 12.8 oz) Filed Weights   07/22/16 0409 07/23/16 0441 07/24/16 0501  Weight: 73.8 kg (162 lb 12.8 oz) 74.7 kg (164 lb 11.2 oz) 79.2 kg (174 lb 8 oz)    Intake/Output: I/O last 3 completed shifts: In: 960 [P.O.:960] Out: 450 [Urine:450]   Intake/Output this shift:  No intake/output data recorded.  Physical Exam: General: NAD  Head: Normocephalic, atraumatic. Moist oral mucosal membranes  Eyes: Anicteric, PERRL  Neck: Supple, trachea midline  Lungs:  Clear to auscultation  Heart: Regular rate and rhythm  Abdomen:  Soft, nontender,   Extremities: No peripheral edema.  Neurologic: Nonfocal, moving all four extremities  Skin: No lesions       Basic Metabolic Panel:  Recent Labs Lab 07/21/16 0917 07/21/16 1459 07/22/16 0417 07/23/16 0605 07/24/16 0548 07/24/16 0551  NA 140  --  139 136 131* 132*  K 3.3*  --  4.2 4.9 5.8* 5.8*  CL 104  --  104 104 100* 100*  CO2 25  --  21* 18* 15* 14*  GLUCOSE 208*  --  129* 156* 245* 245*  BUN 20  --  34* 61* 83* 82*  CREATININE 1.32*  --  1.97* 3.14* 4.24* 4.31*  CALCIUM 9.0  --  9.1 8.3* 7.4* 7.4*  MG  --  2.1  --   --   --   --   PHOS  --   --   --   --   --  5.6*    Liver Function Tests:  Recent Labs Lab 07/21/16 0917 07/24/16 0551  AST 34  --   ALT 20  --   ALKPHOS 207*  --   BILITOT 2.6*  --   PROT 7.7  --   ALBUMIN 3.9 3.7   No results for input(s): LIPASE, AMYLASE in the last 168 hours. No results for input(s): AMMONIA in  the last 168 hours.  CBC:  Recent Labs Lab 07/21/16 0917 07/22/16 0417  WBC 20.9* 23.3*  NEUTROABS 17.6*  --   HGB 10.5* 11.0*  HCT 33.6* 34.4*  MCV 94.3 95.1  PLT 854* 895*    Cardiac Enzymes:  Recent Labs Lab 07/21/16 0917 07/21/16 1459 07/21/16 2100  TROPONINI 0.10* 0.07* 0.09*    BNP: Invalid input(s): POCBNP  CBG:  Recent Labs Lab 07/23/16 0915 07/23/16 1143 07/23/16 1704 07/23/16 2111 07/24/16 0808  GLUCAP 185* 230* 263* 285* 204*    Microbiology: Results for orders placed or performed during the hospital encounter of 03/27/16  Culture, blood (Routine x 2)     Status: None   Collection Time: 03/27/16  5:35 PM  Result Value Ref Range Status   Specimen Description BLOOD LEFT ANTECUBITAL  Final   Special Requests BOTTLES DRAWN AEROBIC AND ANAEROBIC 5CC  Final   Culture NO GROWTH 5 DAYS  Final   Report Status 04/01/2016 FINAL  Final  Culture, blood (Routine x 2)     Status:  None   Collection Time: 03/27/16  5:55 PM  Result Value Ref Range Status   Specimen Description BLOOD RIGHT ANTECUBITAL  Final   Special Requests BOTTLES DRAWN AEROBIC AND ANAEROBIC 5CC  Final   Culture NO GROWTH 5 DAYS  Final   Report Status 04/01/2016 FINAL  Final  Urine culture     Status: Abnormal   Collection Time: 03/27/16  6:05 PM  Result Value Ref Range Status   Specimen Description URINE, RANDOM  Final   Special Requests NONE  Final   Culture <10,000 COLONIES/mL INSIGNIFICANT GROWTH (A)  Final   Report Status 03/29/2016 FINAL  Final    Coagulation Studies: No results for input(s): LABPROT, INR in the last 72 hours.  Urinalysis: No results for input(s): COLORURINE, LABSPEC, PHURINE, GLUCOSEU, HGBUR, BILIRUBINUR, KETONESUR, PROTEINUR, UROBILINOGEN, NITRITE, LEUKOCYTESUR in the last 72 hours.  Invalid input(s): APPERANCEUR    Imaging: Dg Chest 2 View  Result Date: 07/23/2016 CLINICAL DATA:  CHF, chronic cough. EXAM: CHEST  2 VIEW COMPARISON:  07/21/2016  FINDINGS: Cardiomegaly. No confluent opacities, effusions or edema. No acute bony abnormality. IMPRESSION: Cardiomegaly.  No active disease. Electronically Signed   By: Rolm Baptise M.D.   On: 07/23/2016 10:45     Medications:    . amiodarone  200 mg Oral Daily  . aspirin  81 mg Oral Daily  . atorvastatin  40 mg Oral Daily  . azithromycin  500 mg Oral Daily  . bosutinib  300 mg Oral Q breakfast  . enoxaparin (LOVENOX) injection  30 mg Subcutaneous Q24H  . insulin aspart  0-5 Units Subcutaneous QHS  . insulin aspart  0-9 Units Subcutaneous TID WC  . ipratropium-albuterol  3 mL Nebulization Q6H  . methylPREDNISolone (SOLU-MEDROL) injection  60 mg Intravenous Q12H  . metoprolol succinate  25 mg Oral Daily  . sodium chloride flush  3 mL Intravenous Q12H  . sodium chloride flush  3 mL Intravenous Q12H  . tiotropium  18 mcg Inhalation q morning - 10a   sodium chloride, acetaminophen **OR** acetaminophen, albuterol, bisacodyl, diphenhydrAMINE, guaiFENesin-codeine, meclizine, ondansetron **OR** ondansetron (ZOFRAN) IV, oxyCODONE-acetaminophen, senna-docusate, sodium chloride flush  Assessment/ Plan:  Bruce [name] is a 71 y.o. black male with alcoholism, systolic congestive heart failure, CVA, hypertension, coronary artery disease, hyperlipidemia, who was admitted to [facility] on 07/21/2016  1. Acute renal failure with hyperkalemia and hyponatremia: acute renal failure secondary to acute cardiorenal syndrome and overdiuresis Baseline creatinine 1.2. Creatinine fluctuates with volume status.  - hold diuretics   2. Acute exacerbation of systolic congestive heart failure: holding diuretics.   3. COPD acute exacerbation:  - steroids and supportive care.   4. Hypertension: blood pressure at goal.    LOS: [place], [place] 4/14/20189:10 AM

## 2016-07-24 NOTE — Progress Notes (Signed)
Patient ID: Bruce [name], male   DOB: Sep 02, 1945, 71 y.o.   MRN: [id number]  [employer] PROGRESS NOTE  Bruce [name]:[id number] DOB: November 18, 1945 DOA: 07/21/2016 PCP: [name], MD  HPI/Subjective: Patient known to our service. I've seen this patient on 2 other occasions. Patient has lived in his car previously. He has had a cough and shortness of breath.  Objective: Vitals:   07/24/16 0501 07/24/16 1248  BP: 105/73 (!) 115/91  Pulse: 72 72  Resp: 18 18  Temp: 97.5 F (36.4 C)     Filed Weights   07/22/16 0409 07/23/16 0441 07/24/16 0501  Weight: 73.8 kg (162 lb 12.8 oz) 74.7 kg (164 lb 11.2 oz) 79.2 kg (174 lb 8 oz)    ROS: Review of Systems  Constitutional: Negative for chills and fever.  Eyes: Negative for blurred vision.  Respiratory: Positive for cough, shortness of breath and wheezing.   Cardiovascular: Negative for chest pain.  Gastrointestinal: Negative for abdominal pain, constipation, diarrhea, nausea and vomiting.  Genitourinary: Negative for dysuria.  Musculoskeletal: Negative for joint pain.  Neurological: Negative for dizziness and headaches.   Exam: Physical Exam  Constitutional: He is oriented to person, place, and time.  HENT:  Nose: No mucosal edema.  Mouth/Throat: No oropharyngeal exudate or posterior oropharyngeal edema.  Eyes: Conjunctivae, EOM and lids are normal. Pupils are equal, round, and reactive to light.  Neck: No JVD present. Carotid bruit is not present. No edema present. No thyroid mass and no thyromegaly present.  Cardiovascular: S1 normal and S2 normal.  Exam reveals no gallop.   No murmur heard. Pulses:      Dorsalis pedis pulses are 2+ on the right side, and 2+ on the left side.  Respiratory: No respiratory distress. He has decreased breath sounds in the right middle field, the right lower field, the left middle field and the left lower field. He has no wheezes. He has no rhonchi. He has no rales.  With deep  breath, quite a bit of coughing  GI: Soft. Bowel sounds are normal. There is no tenderness.  Musculoskeletal:       Right ankle: He exhibits no swelling.       Left ankle: He exhibits no swelling.  Lymphadenopathy:    He has no cervical adenopathy.  Neurological: He is alert and oriented to person, place, and time. No cranial nerve deficit.  Skin: Skin is warm. No rash noted. Nails show no clubbing.  Psychiatric: He has a normal mood and affect.      Data Reviewed: Basic Metabolic Panel:  Recent Labs Lab 07/21/16 0917 07/21/16 1459 07/22/16 0417 07/23/16 0605 07/24/16 0548 07/24/16 0551  NA 140  --  139 136 131* 132*  K 3.3*  --  4.2 4.9 5.8* 5.8*  CL 104  --  104 104 100* 100*  CO2 25  --  21* 18* 15* 14*  GLUCOSE 208*  --  129* 156* 245* 245*  BUN 20  --  34* 61* 83* 82*  CREATININE 1.32*  --  1.97* 3.14* 4.24* 4.31*  CALCIUM 9.0  --  9.1 8.3* 7.4* 7.4*  MG  --  2.1  --   --   --   --   PHOS  --   --   --   --   --  5.6*   Liver Function Tests:  Recent Labs Lab 07/21/16 0917 07/24/16 0551  AST 34  --   ALT 20  --  ALKPHOS 207*  --   BILITOT 2.6*  --   PROT 7.7  --   ALBUMIN 3.9 3.7   CBC:  Recent Labs Lab 07/21/16 0917 07/22/16 0417  WBC 20.9* 23.3*  NEUTROABS 17.6*  --   HGB 10.5* 11.0*  HCT 33.6* 34.4*  MCV 94.3 95.1  PLT 854* 895*   Cardiac Enzymes:  Recent Labs Lab 07/21/16 0917 07/21/16 1459 07/21/16 2100  TROPONINI 0.10* 0.07* 0.09*   BNP (last 3 results)  Recent Labs  04/04/16 1103 05/02/16 1115 07/21/16 0917  BNP 1,141.8* 2,219.0* 3,630.0*    CBG:  Recent Labs Lab 07/23/16 1143 07/23/16 1704 07/23/16 2111 07/24/16 0808 07/24/16 1145  GLUCAP 230* 263* 285* 204* 210*      Studies: Dg Chest 2 View  Result Date: 07/23/2016 CLINICAL DATA:  CHF, chronic cough. EXAM: CHEST  2 VIEW COMPARISON:  07/21/2016 FINDINGS: Cardiomegaly. No confluent opacities, effusions or edema. No acute bony abnormality. IMPRESSION:  Cardiomegaly.  No active disease. Electronically Signed   By: Rolm Baptise M.D.   On: 07/23/2016 10:45    Scheduled Meds: . amiodarone  200 mg Oral Daily  . aspirin  81 mg Oral Daily  . atorvastatin  40 mg Oral Daily  . azithromycin  500 mg Oral Daily  . bosutinib  300 mg Oral Q breakfast  . enoxaparin (LOVENOX) injection  30 mg Subcutaneous Q24H  . insulin aspart  0-5 Units Subcutaneous QHS  . insulin aspart  0-9 Units Subcutaneous TID WC  . ipratropium-albuterol  3 mL Nebulization Q6H  . methylPREDNISolone (SOLU-MEDROL) injection  60 mg Intravenous Q12H  . metoprolol succinate  25 mg Oral Daily  . sodium chloride flush  3 mL Intravenous Q12H  . sodium chloride flush  3 mL Intravenous Q12H  . tiotropium  18 mcg Inhalation q morning - 10a    Assessment/Plan:  1. COPD exacerbation. On IV Solu-Medrol budesonide nebulizers added. Continue DuoNeb nebulizers. 2. Acute kidney injury on chronic kidney disease stage II with hyperkalemia and hyponatremia. Likely over diuresis and cardiorenal syndrome as per nephrology. We'll give a dose of Kayexalate 3. acute systolic congestive heart failure, NYHA class III. Holding diuretics with acute kidney injury. Holding nephrotoxins. 4. Essential hypertension. Blood pressure on the lower side 5. Arrhythmia history on amiodarone 6. Hyperlipidemia unspecified on atorvastatin 7. CML on aspirin and bosutinib 8. History of TIA and stroke on aspirin 9. Impaired fasting glucose secondary to steroids. Patient not a diabetic at this point with a hemoglobin A1c of 6.0. Continue sliding scale.  Code Status:     Code Status Orders        Start     Ordered   07/21/16 1207  Full code  Continuous     07/21/16 1206    Code Status History    Date Active Date Inactive Code Status Order ID Comments User Context   05/02/2016  6:59 PM 05/07/2016 10:52 PM Full Code [id number]  Idelle Crouch, MD Inpatient   03/21/2016  6:40 PM 03/23/2016  6:39 PM Full Code  [id number]  Idelle Crouch, MD Inpatient   10/10/2015 11:59 PM 10/15/2015  7:08 PM Full Code [id number]  Ivor Costa, MD ED   06/04/2015  1:54 AM 06/06/2015  5:28 PM Full Code [id number]  [name], MD ED   01/25/2015  3:16 AM 01/29/2015  5:17 PM Full Code [id number]  Lavina Hamman, MD ED   09/13/2014  5:07 PM 09/22/2014  6:09 PM Full Code [id number]  Nishant  Dhungel, MD Inpatient   03/20/2014 11:33 PM 03/21/2014  5:46 PM Full Code [id number]  Arne Cleveland, MD Inpatient   03/18/2014 11:46 PM 03/20/2014 11:33 PM Full Code [id number]  [name], MD Inpatient   03/05/2014  9:04 PM 03/06/2014  6:57 PM Full Code [id number]  [name], MD Inpatient     Disposition Plan: To [facility] once kidney function improves  Consultants:  Cardiology   nephrology  Antibiotics:  Zithromax  Time spent: 28 minutes  [place], [place]

## 2016-07-25 LAB — RENAL FUNCTION PANEL
Albumin: 3.7 g/dL (ref 3.5–5.0)
Anion gap: 19 — ABNORMAL HIGH (ref 5–15)
BUN: 100 mg/dL — ABNORMAL HIGH (ref 6–20)
CO2: 16 mmol/L — ABNORMAL LOW (ref 22–32)
Calcium: 6.4 mg/dL — CL (ref 8.9–10.3)
Chloride: 98 mmol/L — ABNORMAL LOW (ref 101–111)
Creatinine, Ser: 5.11 mg/dL — ABNORMAL HIGH (ref 0.61–1.24)
GFR calc Af Amer: 12 mL/min — ABNORMAL LOW
GFR calc non Af Amer: 10 mL/min — ABNORMAL LOW
Glucose, Bld: 226 mg/dL — ABNORMAL HIGH (ref 65–99)
Phosphorus: 6.8 mg/dL — ABNORMAL HIGH (ref 2.5–4.6)
Potassium: 5.1 mmol/L (ref 3.5–5.1)
Sodium: 133 mmol/L — ABNORMAL LOW (ref 135–145)

## 2016-07-25 LAB — MAGNESIUM: MAGNESIUM: 2.7 mg/dL — AB (ref 1.7–2.4)

## 2016-07-25 LAB — GLUCOSE, CAPILLARY
GLUCOSE-CAPILLARY: 243 mg/dL — AB (ref 65–99)
Glucose-Capillary: 339 mg/dL — ABNORMAL HIGH (ref 65–99)
Glucose-Capillary: 399 mg/dL — ABNORMAL HIGH (ref 65–99)
Glucose-Capillary: 394 mg/dL — ABNORMAL HIGH (ref 65–99)

## 2016-07-25 MED ORDER — IPRATROPIUM-ALBUTEROL 0.5-2.5 (3) MG/3ML IN SOLN
3.0000 mL | Freq: Four times a day (QID) | RESPIRATORY_TRACT | Status: DC | PRN
  Administered 2016-07-28: 3 mL via RESPIRATORY_TRACT
  Filled 2016-07-25: qty 3

## 2016-07-25 MED ORDER — DEXTROSE 5 % IV SOLN
INTRAVENOUS | Status: DC
  Administered 2016-07-25 – 2016-07-26 (×3): via INTRAVENOUS
  Filled 2016-07-25 (×3): qty 150

## 2016-07-25 MED ORDER — METHYLPREDNISOLONE SODIUM SUCC 40 MG IJ SOLR
40.0000 mg | Freq: Every day | INTRAMUSCULAR | Status: DC
Start: 2016-07-26 — End: 2016-07-29
  Administered 2016-07-26 – 2016-07-29 (×4): 40 mg via INTRAVENOUS
  Filled 2016-07-25 (×4): qty 1

## 2016-07-25 MED ORDER — PATIROMER SORBITEX CALCIUM 8.4 G PO PACK
8.4000 g | PACK | Freq: Every day | ORAL | Status: DC
  Administered 2016-07-25 – 2016-07-28 (×4): 8.4 g via ORAL
  Filled 2016-07-25 (×5): qty 4

## 2016-07-25 MED ORDER — HEPARIN SODIUM (PORCINE) 5000 UNIT/ML IJ SOLN
5000.0000 [IU] | Freq: Three times a day (TID) | INTRAMUSCULAR | Status: DC
  Administered 2016-07-25 – 2016-08-02 (×18): 5000 [IU] via SUBCUTANEOUS
  Filled 2016-07-25 (×18): qty 1

## 2016-07-25 NOTE — Progress Notes (Signed)
[facility]  ROUNDING NOTE   Subjective:   Breathing better.  Kayexalate given yesterday.  Discontinued spironolactone   Objective:  Vital signs in last 24 hours:  Temp:  [97.4 F (36.3 C)-97.5 F (36.4 C)] 97.5 F (36.4 C) (04/15 0448) Pulse Rate:  [68-72] 68 (04/15 0448) Resp:  [18] 18 (04/15 0448) BP: (93-115)/(52-91) 111/52 (04/15 0448) SpO2:  [91 %-100 %] 97 % (04/15 0813) Weight:  [78.8 kg (173 lb 12.8 oz)] 78.8 kg (173 lb 12.8 oz) (04/15 0448)  Weight change: -0.318 kg (-11.2 oz) Filed Weights   07/23/16 0441 07/24/16 0501 07/25/16 0448  Weight: 74.7 kg (164 lb 11.2 oz) 79.2 kg (174 lb 8 oz) 78.8 kg (173 lb 12.8 oz)    Intake/Output: I/O last 3 completed shifts: In: 360 [P.O.:360] Out: 400 [Urine:400]   Intake/Output this shift:  Total I/O In: 240 [P.O.:240] Out: -   Physical Exam: General: NAD  Head: Normocephalic, atraumatic. Moist oral mucosal membranes  Eyes: Anicteric, PERRL  Neck: Supple, trachea midline  Lungs:  Clear to auscultation  Heart: Regular rate and rhythm  Abdomen:  Soft, nontender,   Extremities: No peripheral edema.  Neurologic: Nonfocal, moving all four extremities  Skin: No lesions       Basic Metabolic Panel:  Recent Labs Lab 07/21/16 1459 07/22/16 0417 07/23/16 0605 07/24/16 0548 07/24/16 0551 07/25/16 0603  NA  --  139 136 131* 132* 133*  K  --  4.2 4.9 5.8* 5.8* 5.1  CL  --  104 104 100* 100* 98*  CO2  --  21* 18* 15* 14* 16*  GLUCOSE  --  129* 156* 245* 245* 226*  BUN  --  34* 61* 83* 82* 100*  CREATININE  --  1.97* 3.14* 4.24* 4.31* 5.11*  CALCIUM  --  9.1 8.3* 7.4* 7.4* 6.4*  MG 2.1  --   --   --   --   --   PHOS  --   --   --   --  5.6* 6.8*    Liver Function Tests:  Recent Labs Lab 07/21/16 0917 07/24/16 0551 07/25/16 0603  AST 34  --   --   ALT 20  --   --   ALKPHOS 207*  --   --   BILITOT 2.6*  --   --   PROT 7.7  --   --   ALBUMIN 3.9 3.7 3.7   No results for input(s): LIPASE,  AMYLASE in the last 168 hours. No results for input(s): AMMONIA in the last 168 hours.  CBC:  Recent Labs Lab 07/21/16 0917 07/22/16 0417  WBC 20.9* 23.3*  NEUTROABS 17.6*  --   HGB 10.5* 11.0*  HCT 33.6* 34.4*  MCV 94.3 95.1  PLT 854* 895*    Cardiac Enzymes:  Recent Labs Lab 07/21/16 0917 07/21/16 1459 07/21/16 2100  TROPONINI 0.10* 0.07* 0.09*    BNP: Invalid input(s): POCBNP  CBG:  Recent Labs Lab 07/24/16 0808 07/24/16 1145 07/24/16 1655 07/24/16 2056 07/25/16 0743  GLUCAP 204* 210* 124* 127* 243*    Microbiology: Results for orders placed or performed during the hospital encounter of 03/27/16  Culture, blood (Routine x 2)     Status: None   Collection Time: 03/27/16  5:35 PM  Result Value Ref Range Status   Specimen Description BLOOD LEFT ANTECUBITAL  Final   Special Requests BOTTLES DRAWN AEROBIC AND ANAEROBIC 5CC  Final   Culture NO GROWTH 5 DAYS  Final  Report Status 04/01/2016 FINAL  Final  Culture, blood (Routine x 2)     Status: None   Collection Time: 03/27/16  5:55 PM  Result Value Ref Range Status   Specimen Description BLOOD RIGHT ANTECUBITAL  Final   Special Requests BOTTLES DRAWN AEROBIC AND ANAEROBIC 5CC  Final   Culture NO GROWTH 5 DAYS  Final   Report Status 04/01/2016 FINAL  Final  Urine culture     Status: Abnormal   Collection Time: 03/27/16  6:05 PM  Result Value Ref Range Status   Specimen Description URINE, RANDOM  Final   Special Requests NONE  Final   Culture <10,000 COLONIES/mL INSIGNIFICANT GROWTH (A)  Final   Report Status 03/29/2016 FINAL  Final    Coagulation Studies: No results for input(s): LABPROT, INR in the last 72 hours.  Urinalysis: No results for input(s): COLORURINE, LABSPEC, PHURINE, GLUCOSEU, HGBUR, BILIRUBINUR, KETONESUR, PROTEINUR, UROBILINOGEN, NITRITE, LEUKOCYTESUR in the last 72 hours.  Invalid input(s): APPERANCEUR    Imaging: No results found.   Medications:   .  sodium bicarbonate   infusion 1000 mL 50 mL/hr at 07/25/16 1043   . amiodarone  200 mg Oral Daily  . aspirin  81 mg Oral Daily  . atorvastatin  40 mg Oral Daily  . azithromycin  500 mg Oral Daily  . bosutinib  300 mg Oral Q breakfast  . enoxaparin (LOVENOX) injection  30 mg Subcutaneous Q24H  . insulin aspart  0-5 Units Subcutaneous QHS  . insulin aspart  0-9 Units Subcutaneous TID WC  . [START ON 07/26/2016] methylPREDNISolone (SOLU-MEDROL) injection  40 mg Intravenous Daily  . metoprolol succinate  25 mg Oral Daily  . patiromer  8.4 g Oral Daily  . sodium chloride flush  3 mL Intravenous Q12H  . sodium chloride flush  3 mL Intravenous Q12H  . tiotropium  18 mcg Inhalation q morning - 10a   sodium chloride, acetaminophen **OR** acetaminophen, albuterol, bisacodyl, diphenhydrAMINE, guaiFENesin-codeine, ipratropium-albuterol, meclizine, ondansetron **OR** ondansetron (ZOFRAN) IV, oxyCODONE-acetaminophen, senna-docusate, sodium chloride flush  Assessment/ Plan:  Mr. DEMYAN FUGATE is a 71 y.o. black male with alcoholism, systolic congestive heart failure, CVA, hypertension, coronary artery disease, hyperlipidemia, who was admitted to [facility] on 07/21/2016  1. Acute renal failure with hyperkalemia and hyponatremia: acute renal failure secondary to acute cardiorenal syndrome and overdiuresis Baseline creatinine 1.2. Creatinine fluctuates with volume status.  BUN elevated due to systemic steroids - hold diuretics  - Start sodium bicarbonate infusion and monitor closely.  - [place] for hyperkalemia  2. Acute exacerbation of systolic congestive heart failure: echo 12/17 EF 15-120%. holding diuretics. No signs of volume overload - Monitor volume status.   3. COPD acute exacerbation:  - steroids and supportive care.   4. Hypertension: blood pressure at goal.    LOS: [place], [place] 4/15/201811:26 AM

## 2016-07-25 NOTE — Progress Notes (Signed)
Dr. Ara Kussmaul returned call, new order for Mag level

## 2016-07-25 NOTE — Progress Notes (Signed)
Pt. Had 12 beat run of SVT, pt. Alert and oriented and in no distress. No signs or c/o pain. Pt. Asymptomatic.  Paged prime. Awaiting call back.

## 2016-07-25 NOTE — Progress Notes (Signed)
Patient ID: Bruce [name], male   DOB: 1945-08-29, 71 y.o.   MRN: [id number]   [facility] PROGRESS NOTE  AUSTIN HERD [id number]:[id number] DOB: Feb 13, 1946 DOA: 07/21/2016 PCP: [name], MD  HPI/Subjective: Patient feeling a little bit better with regards to his breathing. Still having some cough and some wheezing and shortness of breath but better than yesterday. Patient is scared about dying.  Objective: Vitals:   07/24/16 2018 07/25/16 0448  BP: 106/70 (!) 111/52  Pulse: 69 68  Resp: 18 18  Temp: 97.4 F (36.3 C) 97.5 F (36.4 C)    Filed Weights   07/23/16 0441 07/24/16 0501 07/25/16 0448  Weight: 74.7 kg (164 lb 11.2 oz) 79.2 kg (174 lb 8 oz) 78.8 kg (173 lb 12.8 oz)    ROS: Review of Systems  Constitutional: Negative for chills and fever.  Eyes: Negative for blurred vision.  Respiratory: Positive for cough, shortness of breath and wheezing.   Cardiovascular: Negative for chest pain.  Gastrointestinal: Negative for abdominal pain, constipation, diarrhea, nausea and vomiting.  Genitourinary: Negative for dysuria.  Musculoskeletal: Negative for joint pain.  Neurological: Negative for dizziness and headaches.   Exam: Physical Exam  Constitutional: He is oriented to person, place, and time.  HENT:  Nose: No mucosal edema.  Mouth/Throat: No oropharyngeal exudate or posterior oropharyngeal edema.  Eyes: Conjunctivae, EOM and lids are normal. Pupils are equal, round, and reactive to light.  Neck: No JVD present. Carotid bruit is not present. No edema present. No thyroid mass and no thyromegaly present.  Cardiovascular: S1 normal and S2 normal.  Exam reveals no gallop.   No murmur heard. Pulses:      Dorsalis pedis pulses are 2+ on the right side, and 2+ on the left side.  Respiratory: No respiratory distress. He has decreased breath sounds in the right lower field and the left lower field. He has no wheezes. He has no rhonchi. He has no rales.  GI: Soft.  Bowel sounds are normal. There is no tenderness.  Musculoskeletal:       Right ankle: He exhibits no swelling.       Left ankle: He exhibits no swelling.  Lymphadenopathy:    He has no cervical adenopathy.  Neurological: He is alert and oriented to person, place, and time. No cranial nerve deficit.  Skin: Skin is warm. No rash noted. Nails show no clubbing.  Psychiatric: He has a normal mood and affect.      Data Reviewed: Basic Metabolic Panel:  Recent Labs Lab 07/21/16 1459 07/22/16 0417 07/23/16 0605 07/24/16 0548 07/24/16 0551 07/25/16 0603  NA  --  139 136 131* 132* 133*  K  --  4.2 4.9 5.8* 5.8* 5.1  CL  --  104 104 100* 100* 98*  CO2  --  21* 18* 15* 14* 16*  GLUCOSE  --  129* 156* 245* 245* 226*  BUN  --  34* 61* 83* 82* 100*  CREATININE  --  1.97* 3.14* 4.24* 4.31* 5.11*  CALCIUM  --  9.1 8.3* 7.4* 7.4* 6.4*  MG 2.1  --   --   --   --   --   PHOS  --   --   --   --  5.6* 6.8*   Liver Function Tests:  Recent Labs Lab 07/21/16 0917 07/24/16 0551 07/25/16 0603  AST 34  --   --   ALT 20  --   --   ALKPHOS 207*  --   --  BILITOT 2.6*  --   --   PROT 7.7  --   --   ALBUMIN 3.9 3.7 3.7   CBC:  Recent Labs Lab 07/21/16 0917 07/22/16 0417  WBC 20.9* 23.3*  NEUTROABS 17.6*  --   HGB 10.5* 11.0*  HCT 33.6* 34.4*  MCV 94.3 95.1  PLT 854* 895*   Cardiac Enzymes:  Recent Labs Lab 07/21/16 0917 07/21/16 1459 07/21/16 2100  TROPONINI 0.10* 0.07* 0.09*   BNP (last 3 results)  Recent Labs  04/04/16 1103 05/02/16 1115 07/21/16 0917  BNP 1,141.8* 2,219.0* 3,630.0*    CBG:  Recent Labs Lab 07/24/16 1145 07/24/16 1655 07/24/16 2056 07/25/16 0743 07/25/16 1155  GLUCAP 210* 124* 127* 243* 339*     Scheduled Meds: . amiodarone  200 mg Oral Daily  . aspirin  81 mg Oral Daily  . atorvastatin  40 mg Oral Daily  . azithromycin  500 mg Oral Daily  . bosutinib  300 mg Oral Q breakfast  . heparin subcutaneous  5,000 Units Subcutaneous Q8H   . insulin aspart  0-5 Units Subcutaneous QHS  . insulin aspart  0-9 Units Subcutaneous TID WC  . [START ON 07/26/2016] methylPREDNISolone (SOLU-MEDROL) injection  40 mg Intravenous Daily  . metoprolol succinate  25 mg Oral Daily  . patiromer  8.4 g Oral Daily  . sodium chloride flush  3 mL Intravenous Q12H  . sodium chloride flush  3 mL Intravenous Q12H  . tiotropium  18 mcg Inhalation q morning - 10a    Assessment/Plan:  1. COPD exacerbation. Decrease IV Solu-Medrol to 40 mg IV daily since lungs are sounding better. Continue budesonide nebulizers and DuoNeb nebulizers. 2. Acute kidney injury on chronic kidney disease stage II with hyperkalemia and hyponatremia. Likely over diuresis and cardiorenal syndrome as per nephrology.  Creatinine actually worsening on a daily basis at this point. Creatinine up to 5.11 today. Nephrology started bicarbonate drip. 3. Chronic systolic congestive heart failure, NYHA class III. Holding diuretics with acute kidney injury. Holding nephrotoxins. This was likely not acute congestive heart failure. 4. Essential hypertension. Blood pressure on the lower side 5. Arrhythmia history on amiodarone 6. Hyperlipidemia unspecified on atorvastatin 7. CML on aspirin and bosutinib 8. History of TIA and stroke on aspirin 9. Impaired fasting glucose secondary to steroids. Patient not a diabetic at this point with a hemoglobin A1c of 6.0. Continue sliding scale.  Code Status:     Code Status Orders        Start     Ordered   07/21/16 1207  Full code  Continuous     07/21/16 1206    Code Status History    Date Active Date Inactive Code Status Order ID Comments User Context   05/02/2016  6:59 PM 05/07/2016 10:52 PM Full Code [id number]  Idelle Crouch, MD Inpatient   03/21/2016  6:40 PM 03/23/2016  6:39 PM Full Code [id number]  Idelle Crouch, MD Inpatient   10/10/2015 11:59 PM 10/15/2015  7:08 PM Full Code [id number]  Ivor Costa, MD ED   06/04/2015  1:54 AM 06/06/2015   5:28 PM Full Code [id number]  [name], MD ED   01/25/2015  3:16 AM 01/29/2015  5:17 PM Full Code [id number]  Lavina Hamman, MD ED   09/13/2014  5:07 PM 09/22/2014  6:09 PM Full Code [id number]  Louellen Molder, MD Inpatient   03/20/2014 11:33 PM 03/21/2014  5:46 PM Full Code [id number]  Arne Cleveland, MD Inpatient   03/18/2014 11:46  PM 03/20/2014 11:33 PM Full Code [id number]  [name], MD Inpatient   03/05/2014  9:04 PM 03/06/2014  6:57 PM Full Code [id number]  [name], MD Inpatient     Disposition Plan: To  healthcare once kidney function improves. This will likely take a while  Consultants:  Cardiology   nephrology  Antibiotics:  Zithromax  Time spent: 26 minutes  [place], [place]

## 2016-07-26 LAB — BASIC METABOLIC PANEL
Anion gap: 19 — ABNORMAL HIGH (ref 5–15)
BUN: 120 mg/dL — ABNORMAL HIGH (ref 6–20)
CALCIUM: 5.7 mg/dL — AB (ref 8.9–10.3)
CO2: 19 mmol/L — AB (ref 22–32)
Chloride: 95 mmol/L — ABNORMAL LOW (ref 101–111)
Creatinine, Ser: 5.87 mg/dL — ABNORMAL HIGH (ref 0.61–1.24)
GFR calc non Af Amer: 9 mL/min — ABNORMAL LOW (ref >60)
GFR, EST AFRICAN AMERICAN: 10 mL/min — AB (ref >60)
GLUCOSE: 348 mg/dL — AB (ref 65–99)
Potassium: 5 mmol/L (ref 3.5–5.1)
Sodium: 133 mmol/L — ABNORMAL LOW (ref 135–145)

## 2016-07-26 LAB — GLUCOSE, CAPILLARY
GLUCOSE-CAPILLARY: 207 mg/dL — AB (ref 65–99)
GLUCOSE-CAPILLARY: 233 mg/dL — AB (ref 65–99)
Glucose-Capillary: 331 mg/dL — ABNORMAL HIGH (ref 65–99)
Glucose-Capillary: 288 mg/dL — ABNORMAL HIGH (ref 65–99)

## 2016-07-26 MED ORDER — INSULIN ASPART 100 UNIT/ML ~~LOC~~ SOLN
3.0000 [IU] | Freq: Three times a day (TID) | SUBCUTANEOUS | Status: DC
  Administered 2016-07-27 – 2016-07-30 (×8): 3 [IU] via SUBCUTANEOUS
  Filled 2016-07-26 (×9): qty 3

## 2016-07-26 MED ORDER — INSULIN DETEMIR 100 UNIT/ML ~~LOC~~ SOLN
12.0000 [IU] | Freq: Every day | SUBCUTANEOUS | Status: DC
  Administered 2016-07-26 – 2016-07-29 (×4): 12 [IU] via SUBCUTANEOUS
  Filled 2016-07-26 (×5): qty 0.12

## 2016-07-26 MED ORDER — CALCITRIOL 0.25 MCG PO CAPS
0.2500 ug | ORAL_CAPSULE | Freq: Every day | ORAL | Status: DC
  Administered 2016-07-26 – 2016-08-03 (×8): 0.25 ug via ORAL
  Filled 2016-07-26 (×9): qty 1

## 2016-07-26 NOTE — Progress Notes (Signed)
[place] responded to consult to consult with patient regarding dialysis decision; patient sleeping when [facility] arrived at room; [facility] will pass on to Day Chaplain to follow-up as needed. Gwynn Burly 8:29 PM

## 2016-07-26 NOTE — Progress Notes (Addendum)
Inpatient Diabetes Program Recommendations  AACE/ADA: New Consensus Statement on Inpatient Glycemic Control (2015)  Target Ranges:  Prepandial:   less than 140 mg/dL      Peak postprandial:   less than 180 mg/dL (1-2 hours)      Critically ill patients:  140 - 180 mg/dL   Results for TYREIK, DELAHOUSSAYE (MRN [id number]) as of 07/26/2016 10:57  Ref. Range 07/25/2016 07:43 07/25/2016 11:55 07/25/2016 16:51 07/25/2016 20:45  Glucose-Capillary Latest Ref Range: 65 - 99 mg/dL 243 (H)  3 units Novolog 339 (H)  7 units Novolog 399 (H)  9 units Novolog 394 (H)  5 units Novolog   Results for VERNE, LANUZA (MRN [id number]) as of 07/26/2016 10:57  Ref. Range 07/26/2016 07:43  Glucose-Capillary Latest Ref Range: 65 - 99 mg/dL 331 (H)  7 units Novolog    Home DM Meds: Metformin 500 mg BID  Current Insulin Orders: Novolog Sensitive Correction Scale/ SSI (0-9 units) TID AC + HS      MD- Note Solumedrol decreased to 40 mg daily today.  Having severely elevated blood glucose levels likely due to steroids.  If patient continues on Solumedrol, please consider the following:  1. Start Levemir 12 units daily (0.15 units/kg dosing based on weight of 80 kg)  2. Start Novolog Meal Coverage: Novolog 3 units TID with meals (hold if pt eats <50% of meal)     --Will follow patient during hospitalization--  Wyn Quaker RN, MSN, CDE Diabetes Coordinator Inpatient Glycemic Control Team Team Pager: [phone] (8a-5p)

## 2016-07-26 NOTE — Progress Notes (Signed)
Pt. Slept well throughout the night. He requested cough medicine for his non-productive cough x2 with effective results. No signs or c/o pain or distress noted during the night.

## 2016-07-26 NOTE — Progress Notes (Signed)
[facility]  ROUNDING NOTE   Subjective:   Patient states he does not feel well overall - "itching inside" - no nausea or vomiting - reports dry mouth + Dry cough   Objective:  Vital signs in last 24 hours:  Temp:  [97.9 F (36.6 C)-98.6 F (37 C)] 98.6 F (37 C) (04/16 0504) Pulse Rate:  [61-62] 61 (04/16 0504) Resp:  [18-20] 18 (04/16 0504) BP: (95-97)/(63-69) 95/63 (04/16 0504) SpO2:  [94 %-99 %] 94 % (04/16 0504) Weight:  [80.5 kg (177 lb 8 oz)] 80.5 kg (177 lb 8 oz) (04/16 0452)  Weight change: 1.678 kg (3 lb 11.2 oz) Filed Weights   07/24/16 0501 07/25/16 0448 07/26/16 0452  Weight: 79.2 kg (174 lb 8 oz) 78.8 kg (173 lb 12.8 oz) 80.5 kg (177 lb 8 oz)    Intake/Output: I/O last 3 completed shifts: In: 1299.2 [P.O.:360; I.V.:939.2] Out: 400 [Urine:400]   Intake/Output this shift:  No intake/output data recorded.  Physical Exam: General: NAD  Head: Normocephalic, atraumatic. Moist oral mucosal membranes  Eyes: Anicteric,    Neck: Supple, trachea midline  Lungs:  Wheezing and Rhonchi at the rt lung base.   Heart: Regular rate and rhythm  Abdomen:  Soft, nontender,   Extremities: No peripheral edema.  Neurologic: Nonfocal, moving all four extremities  Skin: No lesions       Basic Metabolic Panel:  Recent Labs Lab 07/21/16 1459  07/23/16 0605 07/24/16 0548 07/24/16 0551 07/25/16 0603 07/25/16 2115 07/26/16 0657  NA  --   < > 136 131* 132* 133*  --  133*  K  --   < > 4.9 5.8* 5.8* 5.1  --  5.0  CL  --   < > 104 100* 100* 98*  --  95*  CO2  --   < > 18* 15* 14* 16*  --  19*  GLUCOSE  --   < > 156* 245* 245* 226*  --  348*  BUN  --   < > 61* 83* 82* 100*  --  120*  CREATININE  --   < > 3.14* 4.24* 4.31* 5.11*  --  5.87*  CALCIUM  --   < > 8.3* 7.4* 7.4* 6.4*  --  5.7*  MG 2.1  --   --   --   --   --  2.7*  --   PHOS  --   --   --   --  5.6* 6.8*  --   --   < > = values in this interval not displayed.  Liver Function  Tests:  Recent Labs Lab 07/21/16 0917 07/24/16 0551 07/25/16 0603  AST 34  --   --   ALT 20  --   --   ALKPHOS 207*  --   --   BILITOT 2.6*  --   --   PROT 7.7  --   --   ALBUMIN 3.9 3.7 3.7   No results for input(s): LIPASE, AMYLASE in the last 168 hours. No results for input(s): AMMONIA in the last 168 hours.  CBC:  Recent Labs Lab 07/21/16 0917 07/22/16 0417  WBC 20.9* 23.3*  NEUTROABS 17.6*  --   HGB 10.5* 11.0*  HCT 33.6* 34.4*  MCV 94.3 95.1  PLT 854* 895*    Cardiac Enzymes:  Recent Labs Lab 07/21/16 0917 07/21/16 1459 07/21/16 2100  TROPONINI 0.10* 0.07* 0.09*    BNP: Invalid input(s): POCBNP  CBG:  Recent Labs Lab 07/25/16 0743  07/25/16 1155 07/25/16 1651 07/25/16 2045 07/26/16 0743  GLUCAP 243* 339* 399* 394* 331*    Microbiology: Results for orders placed or performed during the hospital encounter of 03/27/16  Culture, blood (Routine x 2)     Status: None   Collection Time: 03/27/16  5:35 PM  Result Value Ref Range Status   Specimen Description BLOOD LEFT ANTECUBITAL  Final   Special Requests BOTTLES DRAWN AEROBIC AND ANAEROBIC 5CC  Final   Culture NO GROWTH 5 DAYS  Final   Report Status 04/01/2016 FINAL  Final  Culture, blood (Routine x 2)     Status: None   Collection Time: 03/27/16  5:55 PM  Result Value Ref Range Status   Specimen Description BLOOD RIGHT ANTECUBITAL  Final   Special Requests BOTTLES DRAWN AEROBIC AND ANAEROBIC 5CC  Final   Culture NO GROWTH 5 DAYS  Final   Report Status 04/01/2016 FINAL  Final  Urine culture     Status: Abnormal   Collection Time: 03/27/16  6:05 PM  Result Value Ref Range Status   Specimen Description URINE, RANDOM  Final   Special Requests NONE  Final   Culture <10,000 COLONIES/mL INSIGNIFICANT GROWTH (A)  Final   Report Status 03/29/2016 FINAL  Final    Coagulation Studies: No results for input(s): LABPROT, INR in the last 72 hours.  Urinalysis: No results for input(s): COLORURINE,  LABSPEC, PHURINE, GLUCOSEU, HGBUR, BILIRUBINUR, KETONESUR, PROTEINUR, UROBILINOGEN, NITRITE, LEUKOCYTESUR in the last 72 hours.  Invalid input(s): APPERANCEUR    Imaging: No results found.   Medications:   .  sodium bicarbonate  infusion 1000 mL 50 mL/hr at 07/26/16 0435   . amiodarone  200 mg Oral Daily  . aspirin  81 mg Oral Daily  . atorvastatin  40 mg Oral Daily  . azithromycin  500 mg Oral Daily  . bosutinib  300 mg Oral Q breakfast  . heparin subcutaneous  5,000 Units Subcutaneous Q8H  . insulin aspart  0-5 Units Subcutaneous QHS  . insulin aspart  0-9 Units Subcutaneous TID WC  . methylPREDNISolone (SOLU-MEDROL) injection  40 mg Intravenous Daily  . metoprolol succinate  25 mg Oral Daily  . patiromer  8.4 g Oral Daily  . sodium chloride flush  3 mL Intravenous Q12H  . sodium chloride flush  3 mL Intravenous Q12H  . tiotropium  18 mcg Inhalation q morning - 10a   sodium chloride, acetaminophen **OR** acetaminophen, albuterol, bisacodyl, diphenhydrAMINE, guaiFENesin-codeine, ipratropium-albuterol, meclizine, ondansetron **OR** ondansetron (ZOFRAN) IV, oxyCODONE-acetaminophen, senna-docusate, sodium chloride flush  Assessment/ Plan:  [name] is a 71 y.o. black male with alcoholism, systolic congestive heart failure, CVA, hypertension, coronary artery disease, hyperlipidemia, who was admitted to [facility] on 07/21/2016  1. Acute renal failure  Acute renal failure secondary to acute cardiorenal syndrome  Baseline creatinine 1.2.  Cr 5.87, BUN 120 Creatinine is continuing to worse Discussed need for dialysis for ARF. Patient wants to think about it. NPO tonight for possible perm-cath tomorrow  2. Acute exacerbation of systolic congestive heart failure: echo 12/17 EF 15-20%., pulmonary Hypertension holding diuretics. No signs of volume overload - Monitor volume status.   3. COPD acute exacerbation:  - steroids and supportive care.   4. Acidosis - currently  on bicarb drip 50 cc/hr.  5. Hypocalcemia - start vitamin D      LOS: 5 Bruce [name] 4/16/201811:28 AM

## 2016-07-26 NOTE — Progress Notes (Addendum)
PT Cancellation Note  Patient Details Name: Bruce [name] MRN: [id number] DOB: Apr 23, 1945   Cancelled Treatment:     Chart reviewed.  Calcium 5.7.  Held per PT protocols.  Will continue as appropriate.  Addendum: Discussed with Dr. Candiss Norse.  OK'ed for therapy.  Pt offered and encouraged to participate but declined stating he felt poorly.  Will continue as appropriate.   Chesley Noon, PTA 07/26/16, 2:31 PM

## 2016-07-26 NOTE — Progress Notes (Signed)
Patient ID: Bruce [name], male   DOB: 1946/01/27, 71 y.o.   MRN: [id number]   [facility] PROGRESS NOTE  TIBERIUS LOFTUS [id number] DOB: [birth year] DOA: 07/21/2016 PCP: [name], MD  HPI/Subjective: Not feeling well. Nephro considering HD tomorrow if patient agrees, kidney function continues to worsen  Objective: Vitals:   07/26/16 0504 07/26/16 1322  BP: 95/63 111/68  Pulse: 61 73  Resp: 18 18  Temp: 98.6 F (37 C) 98.3 F (36.8 C)    Filed Weights   07/24/16 0501 07/25/16 0448 07/26/16 0452  Weight: 79.2 kg (174 lb 8 oz) 78.8 kg (173 lb 12.8 oz) 80.5 kg (177 lb 8 oz)    ROS: Review of Systems  Constitutional: Negative for chills and fever.  Eyes: Negative for blurred vision.  Respiratory: Positive for cough, shortness of breath and wheezing.   Cardiovascular: Negative for chest pain.  Gastrointestinal: Negative for abdominal pain, constipation, diarrhea, nausea and vomiting.  Genitourinary: Negative for dysuria.  Musculoskeletal: Negative for joint pain.  Neurological: Negative for dizziness and headaches.   Exam: Physical Exam  Constitutional: He is oriented to person, place, and time.  HENT:  Nose: No mucosal edema.  Mouth/Throat: No oropharyngeal exudate or posterior oropharyngeal edema.  Eyes: Conjunctivae, EOM and lids are normal. Pupils are equal, round, and reactive to light.  Neck: No JVD present. Carotid bruit is not present. No edema present. No thyroid mass and no thyromegaly present.  Cardiovascular: S1 normal and S2 normal.  Exam reveals no gallop.   No murmur heard. Pulses:      Dorsalis pedis pulses are 2+ on the right side, and 2+ on the left side.  Respiratory: No respiratory distress. He has decreased breath sounds in the right lower field and the left lower field. He has no wheezes. He has no rhonchi. He has no rales.  GI: Soft. Bowel sounds are normal. There is no tenderness.  Musculoskeletal:       Right ankle: He exhibits  no swelling.       Left ankle: He exhibits no swelling.  Lymphadenopathy:    He has no cervical adenopathy.  Neurological: He is alert and oriented to person, place, and time. No cranial nerve deficit.  Skin: Skin is warm. No rash noted. Nails show no clubbing.  Psychiatric: He has a normal mood and affect.    Data Reviewed: Basic Metabolic Panel:  Recent Labs Lab 07/21/16 1459  07/23/16 0272 07/24/16 0548 07/24/16 0551 07/25/16 0603 07/25/16 2115 07/26/16 0657  NA  --   < > 136 131* 132* 133*  --  133*  K  --   < > 4.9 5.8* 5.8* 5.1  --  5.0  CL  --   < > 104 100* 100* 98*  --  95*  CO2  --   < > 18* 15* 14* 16*  --  19*  GLUCOSE  --   < > 156* 245* 245* 226*  --  348*  BUN  --   < > 61* 83* 82* 100*  --  120*  CREATININE  --   < > 3.14* 4.24* 4.31* 5.11*  --  5.87*  CALCIUM  --   < > 8.3* 7.4* 7.4* 6.4*  --  5.7*  MG 2.1  --   --   --   --   --  2.7*  --   PHOS  --   --   --   --  5.6* 6.8*  --   --   < > =  values in this interval not displayed. Liver Function Tests:  Recent Labs Lab 07/21/16 0917 07/24/16 0551 07/25/16 0603  AST 34  --   --   ALT 20  --   --   ALKPHOS 207*  --   --   BILITOT 2.6*  --   --   PROT 7.7  --   --   ALBUMIN 3.9 3.7 3.7   CBC:  Recent Labs Lab 07/21/16 0917 07/22/16 0417  WBC 20.9* 23.3*  NEUTROABS 17.6*  --   HGB 10.5* 11.0*  HCT 33.6* 34.4*  MCV 94.3 95.1  PLT 854* 895*   Cardiac Enzymes:  Recent Labs Lab 07/21/16 0917 07/21/16 1459 07/21/16 2100  TROPONINI 0.10* 0.07* 0.09*   BNP (last 3 results)  Recent Labs  04/04/16 1103 05/02/16 1115 07/21/16 0917  BNP 1,141.8* 2,219.0* 3,630.0*    CBG:  Recent Labs Lab 07/25/16 1155 07/25/16 1651 07/25/16 2045 07/26/16 0743 07/26/16 1153  GLUCAP 339* 399* 394* 331* 288*     Scheduled Meds: . amiodarone  200 mg Oral Daily  . aspirin  81 mg Oral Daily  . atorvastatin  40 mg Oral Daily  . bosutinib  300 mg Oral Q breakfast  . calcitRIOL  0.25 mcg Oral  Daily  . heparin subcutaneous  5,000 Units Subcutaneous Q8H  . insulin aspart  0-5 Units Subcutaneous QHS  . insulin aspart  0-9 Units Subcutaneous TID WC  . insulin aspart  3 Units Subcutaneous TID WC  . insulin detemir  12 Units Subcutaneous QHS  . methylPREDNISolone (SOLU-MEDROL) injection  40 mg Intravenous Daily  . metoprolol succinate  25 mg Oral Daily  . patiromer  8.4 g Oral Daily  . sodium chloride flush  3 mL Intravenous Q12H  . sodium chloride flush  3 mL Intravenous Q12H  . tiotropium  18 mcg Inhalation q morning - 10a    Assessment/Plan:  1. COPD exacerbation: continue IV Solu-Medrol to 40 mg IV daily since lungs are sounding better. Continue budesonide nebulizers and DuoNeb nebulizers. 2. Acute on chronic kidney disease stage II with hyperkalemia and hyponatremia. Likely over diuresis and cardiorenal syndrome as per nephrology.  Creatinine actually worsening on a daily basis at this point. Creatinine up to 5.87 today. Nephrology considering dialysis starting tomorrow - may get perm catheter 3. Chronic systolic congestive heart failure, NYHA class III. Holding diuretics with acute kidney injury. Holding nephrotoxins. This was likely not acute congestive heart failure. 4. Essential hypertension. Blood pressure on the lower side 5. Arrhythmia history on amiodarone 6. Hyperlipidemia unspecified on atorvastatin 7. CML on aspirin and bosutinib 8. History of TIA and stroke on aspirin 9. Impaired fasting glucose secondary to steroids. Patient not a diabetic at this point with a hemoglobin A1c of 6.0. Continue sliding scale.  Code Status: FULL CODE  Disposition Plan: To [place] [facility] once kidney function improves. This will likely take a while  Consultants:  Cardiology   nephrology  Antibiotics:  Zithromax  Time spent: 26 minutes  Tylynn Braniff [employer]

## 2016-07-26 NOTE — Progress Notes (Signed)
Per MD patient is not medically stable for D/C today. Plan is for patient to D/C to [facility] when stable. Doug admissions coordinator at [facility] is aware of above.   [employer], LCSW [phone]

## 2016-07-26 NOTE — Care Management (Signed)
Worsening renal failure to the point that patient needs to consider dialysis.  Patient very fearful and says he needs to speak with his nephew who is on chronic dialysis. He asks if he starts does he have to continue. Says he is leaning towards at least having some treatments.  Discharge disposition continues to be skilled nursing placement.  Updated Elvera Bicker with [employer]

## 2016-07-27 LAB — CBC
HEMATOCRIT: 33 % — AB (ref 40.0–52.0)
Hemoglobin: 10.3 g/dL — ABNORMAL LOW (ref 13.0–18.0)
MCH: 29 pg (ref 26.0–34.0)
MCHC: 31.1 g/dL — AB (ref 32.0–36.0)
MCV: 93.2 fL (ref 80.0–100.0)
PLATELETS: 687 10*3/uL — AB (ref 150–440)
RBC: 3.54 MIL/uL — ABNORMAL LOW (ref 4.40–5.90)
RDW: 20.3 % — ABNORMAL HIGH (ref 11.5–14.5)
WBC: 23.1 10*3/uL — AB (ref 3.8–10.6)

## 2016-07-27 LAB — GLUCOSE, CAPILLARY
Glucose-Capillary: 216 mg/dL — ABNORMAL HIGH (ref 65–99)
Glucose-Capillary: 258 mg/dL — ABNORMAL HIGH (ref 65–99)
Glucose-Capillary: 218 mg/dL — ABNORMAL HIGH (ref 65–99)
Glucose-Capillary: 199 mg/dL — ABNORMAL HIGH (ref 65–99)

## 2016-07-27 MED ORDER — TUBERCULIN PPD 5 UNIT/0.1ML ID SOLN
5.0000 [IU] | Freq: Once | INTRADERMAL | Status: AC
  Administered 2016-07-27: 5 [IU] via INTRADERMAL
  Filled 2016-07-27: qty 0.1

## 2016-07-27 NOTE — Progress Notes (Signed)
Physical Therapy Treatment Patient Details Name: Bruce [name] MRN: [id number] DOB: 03/13/1946 Today's Date: 07/27/2016    History of Present Illness 71 y.o. male with a known history of CAD, CHF, CK D, CML and cocaine abuse. The patient presently ED with worsening shortness of breath and productive cough for several months. He was admitted to [facility] in March and was thought to have a coronary vasospasm from cocaine use, CHF exacerbation and non-STEMI.    PT Comments    Pt did well with ambulation using QC, though he did have a few small stagger steps and needed cuing to stay on task and consistent with cadence.  He showed good effort with minimal standing balance exercises but reported being too tired to do a lot and needed to get back into bed.  Pt overall not at his baseline, but showing improvement and ability to return to his PLOF with assist.   Follow Up Recommendations  Home health PT;Supervision/Assistance - 24 hour     Equipment Recommendations       Recommendations for Other Services       Precautions / Restrictions Precautions Precautions: Fall Restrictions Weight Bearing Restrictions: No    Mobility  Bed Mobility Overal bed mobility: Modified Independent             General bed mobility comments: Pt able to get in/out of bed w/o direct assist.    Transfers Overall transfer level: Modified independent Equipment used: None             General transfer comment: Pt needed to hold counter top on initially getting up secondary to being light headed.  Feels better after ~1 minute of static standing/leaning on counter  Ambulation/Gait Ambulation/Gait assistance: Supervision Ambulation Distance (Feet): 200 Feet Assistive device: Quad cane       General Gait Details: Pt was able to circumambulate the nurses' station w/o direct assist though he did have occsional stagger steps and unsteadiness with the effort.  Pt had some fatigue, but is O2  remained in the 90s.  Pt showed good confidence, though he was unsteady at times.    Stairs            Wheelchair Mobility    Modified Rankin (Stroke Patients Only)       Balance Overall balance assessment: Modified Independent                                          Cognition Arousal/Alertness: Awake/alert Behavior During Therapy: WFL for tasks assessed/performed Overall Cognitive Status: Within Functional Limits for tasks assessed                                        Exercises Other Exercises Other Exercises: standing balance exercises including multiple bouts of: HHA heel raises 3-5 second holds, HHA SLS 3-5 second holds, static standing with NBOS    General Comments        Pertinent Vitals/Pain Pain Assessment: No/denies pain    Home Living                      Prior Function            PT Goals (current goals can now be found in the care plan section) Progress towards PT goals:  Progressing toward goals    Frequency    Min 2X/week      PT Plan Current plan remains appropriate    Co-evaluation             End of Session Equipment Utilized During Treatment: Gait belt Activity Tolerance: Patient limited by fatigue (After prolonged walk and moderate standing exercises) Patient left: with bed alarm set;with call bell/phone within reach   PT Visit Diagnosis: Muscle weakness (generalized) (M62.81);Difficulty in walking, not elsewhere classified (R26.2)     Time: 9379-0240 PT Time Calculation (min) (ACUTE ONLY): 29 min  Charges:  $Gait Training: 8-22 mins $Therapeutic Exercise: 8-22 mins                    G Codes:       Kreg Shropshire, DPT 07/27/2016, 3:45 PM

## 2016-07-27 NOTE — Progress Notes (Signed)
Patient ID: Bruce [name], male   DOB: [birth year], 71 y.o.   MRN: [id number]   [employer] PROGRESS NOTE  Bruce [name] JOA:[id number] DOB: 1946/01/26 DOA: 07/21/2016 PCP: [name], MD  HPI/Subjective: About same, debating on HD. Nephro considering HD tomorrow if patient agrees.   Objective: Vitals:   07/27/16 0725 07/27/16 1137  BP: 105/73 110/78  Pulse: 62 (!) 105  Resp: 18 19  Temp:  98.2 F (36.8 C)    Filed Weights   07/25/16 0448 07/26/16 0452 07/27/16 0412  Weight: 78.8 kg (173 lb 12.8 oz) 80.5 kg (177 lb 8 oz) 80.4 kg (177 lb 4.8 oz)    ROS: Review of Systems  Constitutional: Negative for chills and fever.  Eyes: Negative for blurred vision.  Respiratory: Positive for cough and shortness of breath. Negative for wheezing.   Cardiovascular: Negative for chest pain.  Gastrointestinal: Negative for abdominal pain, constipation, diarrhea, nausea and vomiting.  Genitourinary: Negative for dysuria.  Musculoskeletal: Negative for joint pain.  Neurological: Negative for dizziness and headaches.   Exam: Physical Exam  Constitutional: He is oriented to person, place, and time.  HENT:  Nose: No mucosal edema.  Mouth/Throat: No oropharyngeal exudate or posterior oropharyngeal edema.  Eyes: Conjunctivae, EOM and lids are normal. Pupils are equal, round, and reactive to light.  Neck: No JVD present. Carotid bruit is not present. No edema present. No thyroid mass and no thyromegaly present.  Cardiovascular: S1 normal and S2 normal.  Exam reveals no gallop.   No murmur heard. Pulses:      Dorsalis pedis pulses are 2+ on the right side, and 2+ on the left side.  Respiratory: No respiratory distress. He has decreased breath sounds in the right lower field and the left lower field. He has no wheezes. He has no rhonchi. He has no rales.  GI: Soft. Bowel sounds are normal. There is no tenderness.  Musculoskeletal:       Right ankle: He exhibits no swelling.   Left ankle: He exhibits no swelling.  Lymphadenopathy:    He has no cervical adenopathy.  Neurological: He is alert and oriented to person, place, and time. No cranial nerve deficit.  Skin: Skin is warm. No rash noted. Nails show no clubbing.  Psychiatric: He has a normal mood and affect.    Data Reviewed: Basic Metabolic Panel:  Recent Labs Lab 07/21/16 1459  07/23/16 6010 07/24/16 0548 07/24/16 0551 07/25/16 0603 07/25/16 2115 07/26/16 0657  NA  --   < > 136 131* 132* 133*  --  133*  K  --   < > 4.9 5.8* 5.8* 5.1  --  5.0  CL  --   < > 104 100* 100* 98*  --  95*  CO2  --   < > 18* 15* 14* 16*  --  19*  GLUCOSE  --   < > 156* 245* 245* 226*  --  348*  BUN  --   < > 61* 83* 82* 100*  --  120*  CREATININE  --   < > 3.14* 4.24* 4.31* 5.11*  --  5.87*  CALCIUM  --   < > 8.3* 7.4* 7.4* 6.4*  --  5.7*  MG 2.1  --   --   --   --   --  2.7*  --   PHOS  --   --   --   --  5.6* 6.8*  --   --   < > =  values in this interval not displayed. Liver Function Tests:  Recent Labs Lab 07/21/16 0917 07/24/16 0551 07/25/16 0603  AST 34  --   --   ALT 20  --   --   ALKPHOS 207*  --   --   BILITOT 2.6*  --   --   PROT 7.7  --   --   ALBUMIN 3.9 3.7 3.7   CBC:  Recent Labs Lab 07/21/16 0917 07/22/16 0417 07/27/16 0433  WBC 20.9* 23.3* 23.1*  NEUTROABS 17.6*  --   --   HGB 10.5* 11.0* 10.3*  HCT 33.6* 34.4* 33.0*  MCV 94.3 95.1 93.2  PLT 854* 895* 687*   Cardiac Enzymes:  Recent Labs Lab 07/21/16 0917 07/21/16 1459 07/21/16 2100  TROPONINI 0.10* 0.07* 0.09*   BNP (last 3 results)  Recent Labs  04/04/16 1103 05/02/16 1115 07/21/16 0917  BNP 1,141.8* 2,219.0* 3,630.0*    CBG:  Recent Labs Lab 07/26/16 1153 07/26/16 1700 07/26/16 2056 07/27/16 0735 07/27/16 1137  GLUCAP 288* 207* 233* 216* 258*     Scheduled Meds: . amiodarone  200 mg Oral Daily  . aspirin  81 mg Oral Daily  . atorvastatin  40 mg Oral Daily  . bosutinib  300 mg Oral Q breakfast  .  calcitRIOL  0.25 mcg Oral Daily  . heparin subcutaneous  5,000 Units Subcutaneous Q8H  . insulin aspart  0-5 Units Subcutaneous QHS  . insulin aspart  0-9 Units Subcutaneous TID WC  . insulin aspart  3 Units Subcutaneous TID WC  . insulin detemir  12 Units Subcutaneous QHS  . methylPREDNISolone (SOLU-MEDROL) injection  40 mg Intravenous Daily  . metoprolol succinate  25 mg Oral Daily  . patiromer  8.4 g Oral Daily  . sodium chloride flush  3 mL Intravenous Q12H  . sodium chloride flush  3 mL Intravenous Q12H  . tiotropium  18 mcg Inhalation q morning - 10a    Assessment/Plan:  1. COPD exacerbation: continue IV Solu-Medrol to 40 mg IV daily. Continue budesonide nebulizers and DuoNeb nebulizers. 2. Acute on chronic kidney disease stage II with hyperkalemia and hyponatremia. Likely over diuresis and cardiorenal syndrome as per nephrology.  Creatinine actually worsening on a daily basis at this point. Creatinine up to 5.87. Nephrology considering dialysis starting tomorrow - may get perm catheter, patient still undecided 3. Chronic systolic congestive heart failure, NYHA class III. Holding diuretics with acute kidney injury. Holding nephrotoxins. This was likely not acute congestive heart failure. 4. Essential hypertension. Blood pressure on the lower side 5. Arrhythmia history on amiodarone 6. Hyperlipidemia unspecified on atorvastatin 7. CML on aspirin and bosutinib 8. History of TIA and stroke on aspirin 9. Impaired fasting glucose secondary to steroids. Patient not a diabetic at this point with a hemoglobin A1c of 6.0. Continue sliding scale.  Code Status: FULL CODE  Disposition Plan: To [facility] once kidney function improves or decision for HD  Consultants:  Cardiology   nephrology  Antibiotics:  Zithromax  Time spent: 16 minutes  Brach Birdsall [employer]

## 2016-07-27 NOTE — Progress Notes (Signed)
PPD administered at 07/27/2016 @ 2237. To be read at 07/29/2016 @ 2237.

## 2016-07-27 NOTE — Progress Notes (Signed)
[facility]  ROUNDING NOTE   Subjective:   Patient is overall doing fair Had several questions about dialysis No acute shortness of breath No new labs today Yesterday's BUN was 120   Objective:  Vital signs in last 24 hours:  Temp:  [97.4 F (36.3 C)-98.2 F (36.8 C)] 98.2 F (36.8 C) (04/17 1137) Pulse Rate:  [61-105] 105 (04/17 1137) Resp:  [18-19] 19 (04/17 1137) BP: (95-110)/(65-78) 110/78 (04/17 1137) SpO2:  [95 %-100 %] 96 % (04/17 1137) Weight:  [80.4 kg (177 lb 4.8 oz)] 80.4 kg (177 lb 4.8 oz) (04/17 0412)  Weight change: -0.091 kg (-3.2 oz) Filed Weights   07/25/16 0448 07/26/16 0452 07/27/16 0412  Weight: 78.8 kg (173 lb 12.8 oz) 80.5 kg (177 lb 8 oz) 80.4 kg (177 lb 4.8 oz)    Intake/Output: I/O last 3 completed shifts: In: 690 [I.V.:690] Out: 450 [Urine:450]   Intake/Output this shift:  Total I/O In: 60 [P.O.:60] Out: -   Physical Exam: General: NAD  Head: Normocephalic, atraumatic. Moist oral mucosal membranes  Eyes: Anicteric,    Neck: Supple, trachea midline  Lungs:  Wheezing and Rhonchi at the rt lung base.   Heart: Regular rate and rhythm  Abdomen:  Soft, nontender,   Extremities: No peripheral edema.  Neurologic: Nonfocal, moving all four extremities  Skin: No lesions       Basic Metabolic Panel:  Recent Labs Lab 07/21/16 1459  07/23/16 0605 07/24/16 0548 07/24/16 0551 07/25/16 0603 07/25/16 2115 07/26/16 0657  NA  --   < > 136 131* 132* 133*  --  133*  K  --   < > 4.9 5.8* 5.8* 5.1  --  5.0  CL  --   < > 104 100* 100* 98*  --  95*  CO2  --   < > 18* 15* 14* 16*  --  19*  GLUCOSE  --   < > 156* 245* 245* 226*  --  348*  BUN  --   < > 61* 83* 82* 100*  --  120*  CREATININE  --   < > 3.14* 4.24* 4.31* 5.11*  --  5.87*  CALCIUM  --   < > 8.3* 7.4* 7.4* 6.4*  --  5.7*  MG 2.1  --   --   --   --   --  2.7*  --   PHOS  --   --   --   --  5.6* 6.8*  --   --   < > = values in this interval not displayed.  Liver  Function Tests:  Recent Labs Lab 07/21/16 0917 07/24/16 0551 07/25/16 0603  AST 34  --   --   ALT 20  --   --   ALKPHOS 207*  --   --   BILITOT 2.6*  --   --   PROT 7.7  --   --   ALBUMIN 3.9 3.7 3.7   No results for input(s): LIPASE, AMYLASE in the last 168 hours. No results for input(s): AMMONIA in the last 168 hours.  CBC:  Recent Labs Lab 07/21/16 0917 07/22/16 0417 07/27/16 0433  WBC 20.9* 23.3* 23.1*  NEUTROABS 17.6*  --   --   HGB 10.5* 11.0* 10.3*  HCT 33.6* 34.4* 33.0*  MCV 94.3 95.1 93.2  PLT 854* 895* 687*    Cardiac Enzymes:  Recent Labs Lab 07/21/16 0917 07/21/16 1459 07/21/16 2100  TROPONINI 0.10* 0.07* 0.09*    BNP:  Invalid input(s): POCBNP  CBG:  Recent Labs Lab 07/26/16 1700 07/26/16 2056 07/27/16 0735 07/27/16 1137 07/27/16 1615  GLUCAP 207* 233* 216* 258* 218*    Microbiology: Results for orders placed or performed during the hospital encounter of 03/27/16  Culture, blood (Routine x 2)     Status: None   Collection Time: 03/27/16  5:35 PM  Result Value Ref Range Status   Specimen Description BLOOD LEFT ANTECUBITAL  Final   Special Requests BOTTLES DRAWN AEROBIC AND ANAEROBIC 5CC  Final   Culture NO GROWTH 5 DAYS  Final   Report Status 04/01/2016 FINAL  Final  Culture, blood (Routine x 2)     Status: None   Collection Time: 03/27/16  5:55 PM  Result Value Ref Range Status   Specimen Description BLOOD RIGHT ANTECUBITAL  Final   Special Requests BOTTLES DRAWN AEROBIC AND ANAEROBIC 5CC  Final   Culture NO GROWTH 5 DAYS  Final   Report Status 04/01/2016 FINAL  Final  Urine culture     Status: Abnormal   Collection Time: 03/27/16  6:05 PM  Result Value Ref Range Status   Specimen Description URINE, RANDOM  Final   Special Requests NONE  Final   Culture <10,000 COLONIES/mL INSIGNIFICANT GROWTH (A)  Final   Report Status 03/29/2016 FINAL  Final    Coagulation Studies: No results for input(s): LABPROT, INR in the last 72  hours.  Urinalysis: No results for input(s): COLORURINE, LABSPEC, PHURINE, GLUCOSEU, HGBUR, BILIRUBINUR, KETONESUR, PROTEINUR, UROBILINOGEN, NITRITE, LEUKOCYTESUR in the last 72 hours.  Invalid input(s): APPERANCEUR    Imaging: No results found.   Medications:   . sodium chloride     . amiodarone  200 mg Oral Daily  . aspirin  81 mg Oral Daily  . atorvastatin  40 mg Oral Daily  . calcitRIOL  0.25 mcg Oral Daily  . heparin subcutaneous  5,000 Units Subcutaneous Q8H  . insulin aspart  0-5 Units Subcutaneous QHS  . insulin aspart  0-9 Units Subcutaneous TID WC  . insulin aspart  3 Units Subcutaneous TID WC  . insulin detemir  12 Units Subcutaneous QHS  . methylPREDNISolone (SOLU-MEDROL) injection  40 mg Intravenous Daily  . metoprolol succinate  25 mg Oral Daily  . patiromer  8.4 g Oral Daily  . sodium chloride flush  3 mL Intravenous Q12H  . sodium chloride flush  3 mL Intravenous Q12H  . tiotropium  18 mcg Inhalation q morning - 10a   sodium chloride, acetaminophen **OR** acetaminophen, albuterol, bisacodyl, diphenhydrAMINE, guaiFENesin-codeine, ipratropium-albuterol, meclizine, ondansetron **OR** ondansetron (ZOFRAN) IV, oxyCODONE-acetaminophen, senna-docusate, sodium chloride flush  Assessment/ Plan:  Bruce [name] is a 71 y.o. black male with alcoholism, systolic congestive heart failure, CVA, hypertension, coronary artery disease, hyperlipidemia, who was admitted to [facility] on 07/21/2016  1. Acute renal failure  Acute renal failure secondary to acute cardiorenal syndrome  Baseline creatinine 1.32.  Jul 21, 2016 Currently Cr 5.87, BUN 120 Creatinine is continuing to worse Discussed need for dialysis for ARF. Patient wants to proceed.  NPO tonight for possible perm-cath tomorrow hepatitis studies ordered. PPD prdered  2. Acute exacerbation of systolic congestive heart failure: echo 12/17 EF 15-20%., pulmonary Hypertension holding diuretics. No signs of volume  overload - Monitor volume status.   3. COPD acute exacerbation:  - steroids and supportive care.   4. Acidosis - d/c bicarb drip - expected to improve with HD  5. Hypocalcemia - start vitamin D      LOS:  6 Bruce [name] 4/17/20187:08 PM

## 2016-07-28 LAB — MAGNESIUM: Magnesium: 2.6 mg/dL — ABNORMAL HIGH (ref 1.7–2.4)

## 2016-07-28 LAB — GLUCOSE, CAPILLARY
GLUCOSE-CAPILLARY: 124 mg/dL — AB (ref 65–99)
Glucose-Capillary: 156 mg/dL — ABNORMAL HIGH (ref 65–99)
Glucose-Capillary: 156 mg/dL — ABNORMAL HIGH (ref 65–99)
Glucose-Capillary: 158 mg/dL — ABNORMAL HIGH (ref 65–99)

## 2016-07-28 LAB — BASIC METABOLIC PANEL
Anion gap: 14 (ref 5–15)
BUN: 114 mg/dL — AB (ref 6–20)
CALCIUM: 5.5 mg/dL — AB (ref 8.9–10.3)
CO2: 25 mmol/L (ref 22–32)
CREATININE: 5.1 mg/dL — AB (ref 0.61–1.24)
Chloride: 96 mmol/L — ABNORMAL LOW (ref 101–111)
GFR calc Af Amer: 12 mL/min — ABNORMAL LOW (ref >60)
GFR, EST NON AFRICAN AMERICAN: 10 mL/min — AB (ref >60)
GLUCOSE: 186 mg/dL — AB (ref 65–99)
Potassium: 4.3 mmol/L (ref 3.5–5.1)
SODIUM: 135 mmol/L (ref 135–145)

## 2016-07-28 LAB — CBC
HCT: 31 % — ABNORMAL LOW (ref 40.0–52.0)
Hemoglobin: 10.2 g/dL — ABNORMAL LOW (ref 13.0–18.0)
MCH: 29.9 pg (ref 26.0–34.0)
MCHC: 32.9 g/dL (ref 32.0–36.0)
MCV: 90.9 fL (ref 80.0–100.0)
Platelets: 727 10*3/uL — ABNORMAL HIGH (ref 150–440)
RBC: 3.41 MIL/uL — ABNORMAL LOW (ref 4.40–5.90)
RDW: 20.5 % — AB (ref 11.5–14.5)
WBC: 29.7 10*3/uL — ABNORMAL HIGH (ref 3.8–10.6)

## 2016-07-28 MED ORDER — CALCIUM CARBONATE ANTACID 500 MG PO CHEW
500.0000 mg | CHEWABLE_TABLET | Freq: Two times a day (BID) | ORAL | Status: DC
  Administered 2016-07-28 – 2016-08-03 (×12): 500 mg via ORAL
  Filled 2016-07-28 (×8): qty 1
  Filled 2016-07-28: qty 2
  Filled 2016-07-28 (×5): qty 1

## 2016-07-28 NOTE — Progress Notes (Signed)
Patient ID: Bruce [name], male   DOB: March 22, 1946, 71 y.o.   MRN: [id number]   [employer] PROGRESS NOTE  Bruce [name] LKG:[id number] DOB: [birth year] DOA: 07/21/2016 PCP: [name], MD  HPI/Subjective: Seems pleasantly confused, all family at bedside debating on dialysis  Objective: Vitals:   07/28/16 0302 07/28/16 1204  BP: 115/86 99/76  Pulse: 96 (!) 107  Resp: 18 19  Temp: 97.5 F (36.4 C) 98.2 F (36.8 C)    Filed Weights   07/26/16 0452 07/27/16 0412 07/28/16 0302  Weight: 80.5 kg (177 lb 8 oz) 80.4 kg (177 lb 4.8 oz) 82.2 kg (181 lb 4.8 oz)    ROS: Review of Systems  Constitutional: Negative for chills and fever.  Eyes: Negative for blurred vision.  Respiratory: Positive for cough and shortness of breath. Negative for wheezing.   Cardiovascular: Negative for chest pain.  Gastrointestinal: Negative for abdominal pain, constipation, diarrhea, nausea and vomiting.  Genitourinary: Negative for dysuria.  Musculoskeletal: Negative for joint pain.  Neurological: Negative for dizziness and headaches.   Exam: Physical Exam  Constitutional: He is oriented to person, place, and time.  HENT:  Nose: No mucosal edema.  Mouth/Throat: No oropharyngeal exudate or posterior oropharyngeal edema.  Eyes: Conjunctivae, EOM and lids are normal. Pupils are equal, round, and reactive to light.  Neck: No JVD present. Carotid bruit is not present. No edema present. No thyroid mass and no thyromegaly present.  Cardiovascular: S1 normal and S2 normal.  Exam reveals no gallop.   No murmur heard. Pulses:      Dorsalis pedis pulses are 2+ on the right side, and 2+ on the left side.  Respiratory: No respiratory distress. He has decreased breath sounds in the right lower field and the left lower field. He has no wheezes. He has no rhonchi. He has no rales.  GI: Soft. Bowel sounds are normal. There is no tenderness.  Musculoskeletal:       Right ankle: He exhibits no swelling.        Left ankle: He exhibits no swelling.  Lymphadenopathy:    He has no cervical adenopathy.  Neurological: He is alert and oriented to person, place, and time. No cranial nerve deficit.  Skin: Skin is warm. No rash noted. Nails show no clubbing.  Psychiatric: He has a normal mood and affect.    Data Reviewed: Basic Metabolic Panel:  Recent Labs Lab 07/21/16 1459  07/24/16 0548 07/24/16 0551 07/25/16 0603 07/25/16 2115 07/26/16 0657 07/28/16 0540  NA  --   < > 131* 132* 133*  --  133* 135  K  --   < > 5.8* 5.8* 5.1  --  5.0 4.3  CL  --   < > 100* 100* 98*  --  95* 96*  CO2  --   < > 15* 14* 16*  --  19* 25  GLUCOSE  --   < > 245* 245* 226*  --  348* 186*  BUN  --   < > 83* 82* 100*  --  120* 114*  CREATININE  --   < > 4.24* 4.31* 5.11*  --  5.87* 5.10*  CALCIUM  --   < > 7.4* 7.4* 6.4*  --  5.7* 5.5*  MG 2.1  --   --   --   --  2.7*  --   --   PHOS  --   --   --  5.6* 6.8*  --   --   --   < > =  values in this interval not displayed. Liver Function Tests:  Recent Labs Lab 07/24/16 0551 07/25/16 0603  ALBUMIN 3.7 3.7   CBC:  Recent Labs Lab 07/22/16 0417 07/27/16 0433 07/28/16 0540  WBC 23.3* 23.1* 29.7*  HGB 11.0* 10.3* 10.2*  HCT 34.4* 33.0* 31.0*  MCV 95.1 93.2 90.9  PLT 895* 687* 727*   Cardiac Enzymes:  Recent Labs Lab 07/21/16 1459 07/21/16 2100  TROPONINI 0.07* 0.09*   BNP (last 3 results)  Recent Labs  04/04/16 1103 05/02/16 1115 07/21/16 0917  BNP 1,141.8* 2,219.0* 3,630.0*    CBG:  Recent Labs Lab 07/27/16 1137 07/27/16 1615 07/27/16 2050 07/28/16 0735 07/28/16 1122  GLUCAP 258* 218* 199* 156* 156*     Scheduled Meds: . amiodarone  200 mg Oral Daily  . aspirin  81 mg Oral Daily  . atorvastatin  40 mg Oral Daily  . calcitRIOL  0.25 mcg Oral Daily  . heparin subcutaneous  5,000 Units Subcutaneous Q8H  . insulin aspart  0-5 Units Subcutaneous QHS  . insulin aspart  0-9 Units Subcutaneous TID WC  . insulin aspart  3  Units Subcutaneous TID WC  . insulin detemir  12 Units Subcutaneous QHS  . methylPREDNISolone (SOLU-MEDROL) injection  40 mg Intravenous Daily  . metoprolol succinate  25 mg Oral Daily  . patiromer  8.4 g Oral Daily  . sodium chloride flush  3 mL Intravenous Q12H  . sodium chloride flush  3 mL Intravenous Q12H  . tiotropium  18 mcg Inhalation q morning - 10a  . tuberculin  5 Units Intradermal Once    Assessment/Plan:  1. COPD exacerbation: continue IV Solu-Medrol to 40 mg IV daily. Continue budesonide nebulizers and DuoNeb nebulizers. 2. Acute on chronic kidney disease stage II with hyperkalemia and hyponatremia. Likely over diuresis and cardiorenal syndrome as per nephrology.  Creatinine actually worsening on a daily basis at this point. Creatinine improved some 5.87->5.1. Nephrology considering dialysis - may get perm catheter, patient/family still undecided 3. Chronic systolic congestive heart failure, NYHA class III. Holding diuretics with acute kidney injury. Holding nephrotoxins. This was likely not acute congestive heart failure. 4. Essential hypertension. Blood pressure on the lower side 5. Arrhythmia history on amiodarone 6. Hyperlipidemia unspecified on atorvastatin 7. CML on aspirin and bosutinib 8. History of TIA and stroke on aspirin 9. Impaired fasting glucose secondary to steroids. Patient not a diabetic at this point with a hemoglobin A1c of 6.0. Continue sliding scale. 10. Severe hypocalcemia: Likely due to underlying kidney issue.  We will c/s pharmacy for electrolyte management  Code Status: FULL CODE  Disposition Plan: To [facility] once kidney function improves or decision for HD  Consultants:  Cardiology   nephrology  Antibiotics:  Zithromax  Time spent: 26 minutes  Andria Head [employer]

## 2016-07-28 NOTE — Progress Notes (Signed)
MEDICATION RELATED CONSULT NOTE - INITIAL   Pharmacy Consult for Electrolyte monitoring/replacement  No Known Allergies  Patient Measurements: Height: 5\' 8"  (172.7 cm) Weight: 181 lb 4.8 oz (82.2 kg) IBW/kg (Calculated) : 68.4  Vital Signs: Temp: 98.2 F (36.8 C) (04/18 1204) Temp Source: Oral (04/18 0302) BP: 99/76 (04/18 1204) Pulse Rate: 107 (04/18 1204) Intake/Output from previous day: 04/17 0701 - 04/18 0700 In: 60 [P.O.:60] Out: 850 [Urine:850] Intake/Output from this shift: Total I/O In: -  Out: 300 [Urine:300]  Labs:  Recent Labs  07/25/16 2115 07/26/16 0657 07/27/16 0433 07/28/16 0540  WBC  --   --  23.1* 29.7*  HGB  --   --  10.3* 10.2*  HCT  --   --  33.0* 31.0*  PLT  --   --  687* 727*  CREATININE  --  5.87*  --  5.10*  MG 2.7*  --   --   --    Estimated Creatinine Clearance: 14.1 mL/min (A) (by C-G formula based on SCr of 5.1 mg/dL (H)).   Microbiology: No results found for this or any previous visit (from the past 720 hour(s)).  Medical History: Past Medical History:  Diagnosis Date  . Bell's palsy   . Chronic combined systolic and diastolic CHF, NYHA class 3 ([place])   . CKD (chronic kidney disease), stage II   . CML (chronic myelocytic leukemia) ([place])   . Coronary artery disease, non-occlusive   . Hypercholesterolemia   . Hypertension   . Leukemia ([place])   . Stroke ([place])    No residual limb weakness.  Walks with cane at baseline.   Marland Kitchen TIA (transient ischemic attack) 05/10/2014   Assessment: Patient has ESRD and per notes - family and patient discussing possibility of dialysis.   Phos = 6.8 on 4/15 likely due to renal failure K WNL this morning Ca = 5.5, but albumin has not been checked in several days  Goal of Therapy: Electrolytes WNL  Plan:  Will not give IV calcium given hyperphosphatemia Will order calcium carbonate 500 mg elemental calcium PO BID. Discussed with nephrology who started patient on calcitriol on 4/16.  Will  recheck electrolytes and albumin with AM labs tomorrow.  Lenis Noon, PharmD Clinical Pharmacist 07/28/2016,12:58 PM

## 2016-07-28 NOTE — Progress Notes (Signed)
PT Cancellation Note  Patient Details Name: Bruce [name] MRN: [id number] DOB: May 13, 1945   Cancelled Treatment:    Reason Eval/Treat Not Completed: Patient not medically ready. Pt currently with low Calcium at 5.5 along with other worsening lab values. Pt also with possible HD catheter placement depending on decision. Pt is not appropriate for PT at this time. Will hold treatment at this time.   Zadkiel Dragan 07/28/2016, 12:41 PM Greggory Stallion, PT, DPT [phone]

## 2016-07-28 NOTE — Progress Notes (Signed)
Cardiology Consult Note  Date: 07/28/16  Time: 8:05 PM  We were asked to reevaluate the patient late this afternoon due to possible ventricular tachycardia. I personally reviewed his telemetry at 5 PM today. He had one episode of brief NSVT. Several other episodes of sustained narrow and wide complex tachycardia were seen and most likely represent SVT (favor atrial tachycardia). Wide complex is most consistent with aberrancy rather than sustained VT. The patient should remain on his current medications, including amiodarone and metoprolol. He will be reevaluated by Dr. Caryl Comes (electrophysiology) tomorrow.  Nelva Bush, MD [facility] Pager: [phone]

## 2016-07-28 NOTE — Progress Notes (Signed)
[facility]  ROUNDING NOTE   Subjective:   Patient is overall doing fair Had several questions about dialysis No acute shortness of breath No new labs today We have noticed improvement in serum creatinine and BUN today to 5.1 and 114 Calcium level remains low at 5.5   Objective:  Vital signs in last 24 hours:  Temp:  [97.4 F (36.3 C)-98.2 F (36.8 C)] 98.2 F (36.8 C) (04/18 1204) Pulse Rate:  [96-107] 107 (04/18 1204) Resp:  [18-19] 19 (04/18 1204) BP: (99-115)/(76-86) 99/76 (04/18 1204) SpO2:  [94 %-98 %] 96 % (04/18 1204) Weight:  [82.2 kg (181 lb 4.8 oz)] 82.2 kg (181 lb 4.8 oz) (04/18 0302)  Weight change: 1.814 kg (4 lb) Filed Weights   07/26/16 0452 07/27/16 0412 07/28/16 0302  Weight: 80.5 kg (177 lb 8 oz) 80.4 kg (177 lb 4.8 oz) 82.2 kg (181 lb 4.8 oz)    Intake/Output: I/O last 3 completed shifts: In: 310 [P.O.:60; I.V.:250] Out: 850 [Urine:850]   Intake/Output this shift:  Total I/O In: -  Out: 300 [Urine:300]  Physical Exam: General: NAD  Head: Normocephalic, atraumatic. Moist oral mucosal membranes  Eyes: Anicteric,    Neck: Supple, trachea midline  Lungs:  Clear to auscultation bilaterally   Heart: Regular rate and rhythm  Abdomen:  Soft, nontender,   Extremities: No peripheral edema.  Neurologic: Nonfocal, moving all four extremities  Skin: No lesions       Basic Metabolic Panel:  Recent Labs Lab 07/24/16 0548 07/24/16 0551 07/25/16 0603 07/25/16 2115 07/26/16 0657 07/28/16 0540  NA 131* 132* 133*  --  133* 135  K 5.8* 5.8* 5.1  --  5.0 4.3  CL 100* 100* 98*  --  95* 96*  CO2 15* 14* 16*  --  19* 25  GLUCOSE 245* 245* 226*  --  348* 186*  BUN 83* 82* 100*  --  120* 114*  CREATININE 4.24* 4.31* 5.11*  --  5.87* 5.10*  CALCIUM 7.4* 7.4* 6.4*  --  5.7* 5.5*  MG  --   --   --  2.7*  --  2.6*  PHOS  --  5.6* 6.8*  --   --   --     Liver Function Tests:  Recent Labs Lab 07/24/16 0551 07/25/16 0603  ALBUMIN 3.7  3.7   No results for input(s): LIPASE, AMYLASE in the last 168 hours. No results for input(s): AMMONIA in the last 168 hours.  CBC:  Recent Labs Lab 07/22/16 0417 07/27/16 0433 07/28/16 0540  WBC 23.3* 23.1* 29.7*  HGB 11.0* 10.3* 10.2*  HCT 34.4* 33.0* 31.0*  MCV 95.1 93.2 90.9  PLT 895* 687* 727*    Cardiac Enzymes:  Recent Labs Lab 07/21/16 2100  TROPONINI 0.09*    BNP: Invalid input(s): POCBNP  CBG:  Recent Labs Lab 07/27/16 1615 07/27/16 2050 07/28/16 0735 07/28/16 1122 07/28/16 1624  GLUCAP 218* 199* 156* 156* 36*    Microbiology: Results for orders placed or performed during the hospital encounter of 03/27/16  Culture, blood (Routine x 2)     Status: None   Collection Time: 03/27/16  5:35 PM  Result Value Ref Range Status   Specimen Description BLOOD LEFT ANTECUBITAL  Final   Special Requests BOTTLES DRAWN AEROBIC AND ANAEROBIC 5CC  Final   Culture NO GROWTH 5 DAYS  Final   Report Status 04/01/2016 FINAL  Final  Culture, blood (Routine x 2)     Status: None   Collection  Time: 03/27/16  5:55 PM  Result Value Ref Range Status   Specimen Description BLOOD RIGHT ANTECUBITAL  Final   Special Requests BOTTLES DRAWN AEROBIC AND ANAEROBIC 5CC  Final   Culture NO GROWTH 5 DAYS  Final   Report Status 04/01/2016 FINAL  Final  Urine culture     Status: Abnormal   Collection Time: 03/27/16  6:05 PM  Result Value Ref Range Status   Specimen Description URINE, RANDOM  Final   Special Requests NONE  Final   Culture <10,000 COLONIES/mL INSIGNIFICANT GROWTH (A)  Final   Report Status 03/29/2016 FINAL  Final    Coagulation Studies: No results for input(s): LABPROT, INR in the last 72 hours.  Urinalysis: No results for input(s): COLORURINE, LABSPEC, PHURINE, GLUCOSEU, HGBUR, BILIRUBINUR, KETONESUR, PROTEINUR, UROBILINOGEN, NITRITE, LEUKOCYTESUR in the last 72 hours.  Invalid input(s): APPERANCEUR    Imaging: No results found.   Medications:   .  sodium chloride     . amiodarone  200 mg Oral Daily  . aspirin  81 mg Oral Daily  . atorvastatin  40 mg Oral Daily  . calcitRIOL  0.25 mcg Oral Daily  . calcium carbonate  500 mg of elemental calcium Oral BID  . heparin subcutaneous  5,000 Units Subcutaneous Q8H  . insulin aspart  0-5 Units Subcutaneous QHS  . insulin aspart  0-9 Units Subcutaneous TID WC  . insulin aspart  3 Units Subcutaneous TID WC  . insulin detemir  12 Units Subcutaneous QHS  . methylPREDNISolone (SOLU-MEDROL) injection  40 mg Intravenous Daily  . metoprolol succinate  25 mg Oral Daily  . patiromer  8.4 g Oral Daily  . sodium chloride flush  3 mL Intravenous Q12H  . sodium chloride flush  3 mL Intravenous Q12H  . tiotropium  18 mcg Inhalation q morning - 10a  . tuberculin  5 Units Intradermal Once   sodium chloride, acetaminophen **OR** acetaminophen, albuterol, bisacodyl, diphenhydrAMINE, guaiFENesin-codeine, ipratropium-albuterol, meclizine, ondansetron **OR** ondansetron (ZOFRAN) IV, oxyCODONE-acetaminophen, senna-docusate, sodium chloride flush  Assessment/ Plan:  Bruce [name] is a 71 y.o. black male with alcoholism, systolic congestive heart failure, CVA, hypertension, coronary artery disease, hyperlipidemia, who was admitted to [facility] on 07/21/2016  1. Acute renal failure  Acute renal failure secondary to acute cardiorenal syndrome  Baseline creatinine 1.32.  Jul 21, 2016 Serum creatinine possibly peaked at 5.87.  Today's level has improved to 5.1, BUN 114 Hopefully the trend that'll continue We will hold off starting dialysis for now Monitor clinical status closely  2. Acute exacerbation of systolic congestive heart failure: echo 12/17 EF 15-20%., pulmonary Hypertension holding diuretics. No signs of volume overload - Monitor volume status.   3. COPD acute exacerbation:  - steroids and supportive care.   4. Acidosis - d/c bicarb drip - monitor  5. Hypocalcemia - start vitamin D - add  calcium supplements 500 twice a day      LOS: 7 Maddisyn Hegwood 4/18/20185:22 PM

## 2016-07-28 NOTE — Care Management (Signed)
Barrier- low calcium, need for HD and indecisiveness . Paged nephrology.

## 2016-07-28 NOTE — Progress Notes (Signed)
CRITICAL VALUE ALERT  Critical value received:  Calcium 5.5  Date of notification: 07/28/2016  Time of notification: 0645  Critical value read back:Yes  Nurse who received alert: Andria Meuse  MD notified (1st page):  Pyreddy   Time of first page:  0645  MD notified (2nd page):  Time of second page:  Responding MD: Pyreddy  Time MD responded:  Mer.Gaudy  MD acknowledged. No new orders at this time. Will continue to monitor.

## 2016-07-29 ENCOUNTER — Ambulatory Visit: Payer: Medicare Other | Admitting: Family

## 2016-07-29 ENCOUNTER — Inpatient Hospital Stay: Payer: Medicare Other

## 2016-07-29 ENCOUNTER — Encounter
Admission: EM | Disposition: A | Discharge status: Skilled Nursing Facility | Payer: Self-pay | Source: Home / Self Care | Attending: Internal Medicine

## 2016-07-29 DIAGNOSIS — R0602 Shortness of breath: Secondary | ICD-10-CM

## 2016-07-29 DIAGNOSIS — N189 Chronic kidney disease, unspecified: Secondary | ICD-10-CM

## 2016-07-29 DIAGNOSIS — I10 Essential (primary) hypertension: Secondary | ICD-10-CM

## 2016-07-29 HISTORY — PX: DIALYSIS/PERMA CATHETER INSERTION: CATH118288

## 2016-07-29 LAB — GLUCOSE, CAPILLARY
GLUCOSE-CAPILLARY: 72 mg/dL (ref 65–99)
GLUCOSE-CAPILLARY: 156 mg/dL — AB (ref 65–99)
Glucose-Capillary: 74 mg/dL (ref 65–99)
Glucose-Capillary: 84 mg/dL (ref 65–99)

## 2016-07-29 LAB — BASIC METABOLIC PANEL
ANION GAP: 19 — AB (ref 5–15)
BUN: 132 mg/dL — ABNORMAL HIGH (ref 6–20)
CALCIUM: 6 mg/dL — AB (ref 8.9–10.3)
CHLORIDE: 97 mmol/L — AB (ref 101–111)
CO2: 22 mmol/L (ref 22–32)
Creatinine, Ser: 5.2 mg/dL — ABNORMAL HIGH (ref 0.61–1.24)
GFR calc Af Amer: 12 mL/min — ABNORMAL LOW (ref >60)
GFR calc non Af Amer: 10 mL/min — ABNORMAL LOW (ref >60)
Glucose, Bld: 106 mg/dL — ABNORMAL HIGH (ref 65–99)
POTASSIUM: 4.9 mmol/L (ref 3.5–5.1)
Sodium: 138 mmol/L (ref 135–145)

## 2016-07-29 LAB — ALBUMIN: ALBUMIN: 3.7 g/dL (ref 3.5–5.0)

## 2016-07-29 LAB — HEPATITIS B SURFACE ANTIBODY,QUALITATIVE: HEP B S AB: REACTIVE

## 2016-07-29 LAB — MAGNESIUM: Magnesium: 2.8 mg/dL — ABNORMAL HIGH (ref 1.7–2.4)

## 2016-07-29 LAB — HEPATITIS B SURFACE ANTIGEN: HEP B S AG: NEGATIVE

## 2016-07-29 LAB — PHOSPHORUS: PHOSPHORUS: 5.5 mg/dL — AB (ref 2.5–4.6)

## 2016-07-29 SURGERY — DIALYSIS/PERMA CATHETER INSERTION
Anesthesia: Moderate Sedation

## 2016-07-29 MED ORDER — LIDOCAINE-EPINEPHRINE (PF) 2 %-1:200000 IJ SOLN
INTRAMUSCULAR | Status: AC
  Filled 2016-07-29: qty 20

## 2016-07-29 MED ORDER — FENTANYL CITRATE (PF) 100 MCG/2ML IJ SOLN
INTRAMUSCULAR | Status: DC | PRN
  Administered 2016-07-29: 50 ug via INTRAVENOUS

## 2016-07-29 MED ORDER — HEPARIN (PORCINE) IN NACL 2-0.9 UNIT/ML-% IJ SOLN
INTRAMUSCULAR | Status: AC
  Filled 2016-07-29: qty 500

## 2016-07-29 MED ORDER — HEPARIN SODIUM (PORCINE) 10000 UNIT/ML IJ SOLN
INTRAMUSCULAR | Status: AC
  Filled 2016-07-29: qty 1

## 2016-07-29 MED ORDER — MIDAZOLAM HCL 2 MG/2ML IJ SOLN
INTRAMUSCULAR | Status: DC | PRN
  Administered 2016-07-29: 2 mg via INTRAVENOUS

## 2016-07-29 MED ORDER — SODIUM CHLORIDE 0.9 % IV SOLN: INTRAVENOUS | Status: DC

## 2016-07-29 MED ORDER — PREDNISONE 20 MG PO TABS
20.0000 mg | ORAL_TABLET | Freq: Every day | ORAL | Status: DC
  Administered 2016-07-30: 20 mg via ORAL
  Filled 2016-07-29: qty 1

## 2016-07-29 MED ORDER — CEFAZOLIN IN D5W 1 GM/50ML IV SOLN
1.0000 g | Freq: Once | INTRAVENOUS | Status: AC
  Administered 2016-07-29: 1 g via INTRAVENOUS
  Filled 2016-07-29: qty 50

## 2016-07-29 MED ORDER — FENTANYL CITRATE (PF) 100 MCG/2ML IJ SOLN
INTRAMUSCULAR | Status: AC
  Filled 2016-07-29: qty 2

## 2016-07-29 MED ORDER — MIDAZOLAM HCL 2 MG/2ML IJ SOLN
INTRAMUSCULAR | Status: AC
  Filled 2016-07-29: qty 2

## 2016-07-29 MED ORDER — AMIODARONE HCL 200 MG PO TABS
400.0000 mg | ORAL_TABLET | Freq: Two times a day (BID) | ORAL | Status: DC
  Administered 2016-07-29 – 2016-08-04 (×9): 400 mg via ORAL
  Filled 2016-07-29 (×9): qty 2

## 2016-07-29 SURGICAL SUPPLY — 7 items
CATH PALINDROME RT-P 15FX19CM (CATHETERS) ×3 IMPLANT
DERMABOND ADVANCED (GAUZE/BANDAGES/DRESSINGS) ×2
DERMABOND ADVANCED .7 DNX12 (GAUZE/BANDAGES/DRESSINGS) ×1 IMPLANT
DRAPE INCISE IOBAN 66X45 STRL (DRAPES) ×3 IMPLANT
PACK ANGIOGRAPHY (CUSTOM PROCEDURE TRAY) ×3 IMPLANT
SUT MNCRL AB 4-0 PS2 18 (SUTURE) ×3 IMPLANT
TOWEL OR 17X26 4PK STRL BLUE (TOWEL DISPOSABLE) ×3 IMPLANT

## 2016-07-29 NOTE — Progress Notes (Addendum)
Patient Name: Bruce [name]      SUBJECTIVE: No chest pain or shortness of breath. Concerns are about "being put in a box "I assume this is imaging.  He also complains of orthostatic dizziness  Echo EF 03/1714-20% with biatrial enlargement  Past Medical History:  Diagnosis Date  . Bell's palsy   . Chronic combined systolic and diastolic CHF, NYHA class 3 ([place])   . CKD (chronic kidney disease), stage II   . CML (chronic myelocytic leukemia) ([place])   . Coronary artery disease, non-occlusive   . Hypercholesterolemia   . Hypertension   . Leukemia ([place])   . Stroke ([place])    No residual limb weakness.  Walks with cane at baseline.   Marland Kitchen TIA (transient ischemic attack) 05/10/2014    Scheduled Meds:  Scheduled Meds: . amiodarone  400 mg Oral BID  . aspirin  81 mg Oral Daily  . atorvastatin  40 mg Oral Daily  . calcitRIOL  0.25 mcg Oral Daily  . calcium carbonate  500 mg of elemental calcium Oral BID  . heparin subcutaneous  5,000 Units Subcutaneous Q8H  . insulin aspart  0-5 Units Subcutaneous QHS  . insulin aspart  0-9 Units Subcutaneous TID WC  . insulin aspart  3 Units Subcutaneous TID WC  . insulin detemir  12 Units Subcutaneous QHS  . methylPREDNISolone (SOLU-MEDROL) injection  40 mg Intravenous Daily  . metoprolol succinate  25 mg Oral Daily  . patiromer  8.4 g Oral Daily  . sodium chloride flush  3 mL Intravenous Q12H  . sodium chloride flush  3 mL Intravenous Q12H  . tiotropium  18 mcg Inhalation q morning - 10a  . tuberculin  5 Units Intradermal Once   Continuous Infusions: . sodium chloride     sodium chloride, acetaminophen **OR** acetaminophen, albuterol, bisacodyl, diphenhydrAMINE, guaiFENesin-codeine, ipratropium-albuterol, meclizine, ondansetron **OR** ondansetron (ZOFRAN) IV, oxyCODONE-acetaminophen, senna-docusate, sodium chloride flush    PHYSICAL EXAM Vitals:   07/28/16 1204 07/28/16 1954 07/29/16 0307 07/29/16 0852  BP: 99/76 106/69  101/77 112/84  Pulse: (!) 107 60 (!) 105 (!) 108  Resp: 19 18 18 18   Temp: 98.2 F (36.8 C) 97.4 F (36.3 C) 97.7 F (36.5 C) 97.6 F (36.4 C)  TempSrc:  Oral Axillary Oral  SpO2: 96% 92% 98% 99%  Weight:   173 lb 11.2 oz (78.8 kg)   Height:        Well developed and nourished in no acute distress anxious about being put into the tube /box HENT normal Neck supple  Clear Regular rate and rhythm, no murmurs or gallops Abd-soft with active BS No Clubbing cyanosis edema Skin-warm and dry A & Oriented  Grossly normal sensory and motor function   TELEMETRY: Reviewed personnally pt in sinus rhythm with recurrent runs of atrial tachycardia     Intake/Output Summary (Last 24 hours) at 07/29/16 1007 Last data filed at 07/29/16 0600  Gross per 24 hour  Intake              240 ml  Output              700 ml  Net             -460 ml    LABS: Basic Metabolic Panel:  Recent Labs Lab 07/23/16 0605 07/24/16 0548  07/24/16 0551 07/25/16 0603  07/26/16 0657 07/28/16 0540 07/29/16 0347  NA 136 131*  --  132* 133*  --  133* 135 138  K 4.9 5.8*  --  5.8* 5.1  --  5.0 4.3 4.9  CL 104 100*  --  100* 98*  --  95* 96* 97*  CO2 18* 15*  --  14* 16*  --  19* 25 22  GLUCOSE 156* 245*  --  245* 226*  --  348* 186* 106*  BUN 61* 83*  --  82* 100*  --  120* 114* 132*  CREATININE 3.14* 4.24*  --  4.31* 5.11*  --  5.87* 5.10* 5.20*  CALCIUM 8.3* 7.4*  --  7.4* 6.4*  --  5.7* 5.5* 6.0*  MG  --   --   --   --   --   < >  --  2.6* 2.8*  PHOS  --   --   < > 5.6* 6.8*  --   --   --  5.5*  < > = values in this interval not displayed. Cardiac Enzymes: No results for input(s): CKTOTAL, CKMB, CKMBINDEX, TROPONINI in the last 72 hours. CBC:  Recent Labs Lab 07/27/16 0433 07/28/16 0540  WBC 23.1* 29.7*  HGB 10.3* 10.2*  HCT 33.0* 31.0*  MCV 93.2 90.9  PLT 687* 727*   PROTIME: No results for input(s): LABPROT, INR in the last 72 hours. Liver Function Tests:  Recent Labs   07/29/16 0347  ALBUMIN 3.7   No results for input(s): LIPASE, AMYLASE in the last 72 hours. BNP: BNP (last 3 results)  Recent Labs  04/04/16 1103 05/02/16 1115 07/21/16 0917  BNP 1,141.8* 2,219.0* 3,630.0*    ProBNP (last 3 results) No results for input(s): PROBNP in the last 8760 hours.     ASSESSMENT AND PLAN:  Atrial tachycardia  Cardiomyopathy  Renal failure  Social distress  Hypotension and orthostatic dizziness   Right now, I think amiodarone is the best medication to control his rhythm. I will increase the dose for now. I would use 400 twice a day for 2 weeks then 400 daily for 2 weeks and then 200 mg a day.  Unfortunately his hypotension precludes much afterload reduction for his cardiomyopathy. Continue metoprolol.  We'll check his orthostatics. He may benefit from ProAmatine and/or compressive clothing   Signed, Virl Axe MD  07/29/2016

## 2016-07-29 NOTE — Discharge Instructions (Signed)
Heart Failure Clinic appointment on Aug 10, 2016 at 10:00am with Darylene Price, [place]. Please call [phone] to reschedule.

## 2016-07-29 NOTE — Progress Notes (Signed)
[facility]  ROUNDING NOTE   Subjective:   Patient is overall doing fair No acute shortness of breath BUN/Cr has worsened  Patient states he is ready for dialysis today   Objective:  Vital signs in last 24 hours:  Temp:  [97.4 F (36.3 C)-97.8 F (36.6 C)] 97.8 F (36.6 C) (04/19 1403) Pulse Rate:  [60-108] 61 (04/19 1530) Resp:  [12-23] 12 (04/19 1630) BP: (101-123)/(68-88) 115/68 (04/19 1630) SpO2:  [92 %-100 %] 97 % (04/19 1630) Weight:  [78.8 kg (173 lb 11.2 oz)] 78.8 kg (173 lb 11.2 oz) (04/19 0307)  Weight change: -3.447 kg (-7 lb 9.6 oz) Filed Weights   07/27/16 0412 07/28/16 0302 07/29/16 0307  Weight: 80.4 kg (177 lb 4.8 oz) 82.2 kg (181 lb 4.8 oz) 78.8 kg (173 lb 11.2 oz)    Intake/Output: I/O last 3 completed shifts: In: 300 [P.O.:300] Out: 1100 [Urine:1100]   Intake/Output this shift:  Total I/O In: -  Out: 125 [Urine:125]  Physical Exam: General: NAD  Head: Normocephalic, atraumatic. Moist oral mucosal membranes  Eyes: Anicteric,    Neck: Supple, trachea midline  Lungs:  Clear to auscultation bilaterally   Heart: Regular rate and rhythm  Abdomen:  Soft, nontender,   Extremities: No peripheral edema.  Neurologic: Nonfocal, moving all four extremities  Skin: No lesions       Basic Metabolic Panel:  Recent Labs Lab 07/24/16 0551 07/25/16 0603 07/25/16 2115 07/26/16 0657 07/28/16 0540 07/29/16 0347  NA 132* 133*  --  133* 135 138  K 5.8* 5.1  --  5.0 4.3 4.9  CL 100* 98*  --  95* 96* 97*  CO2 14* 16*  --  19* 25 22  GLUCOSE 245* 226*  --  348* 186* 106*  BUN 82* 100*  --  120* 114* 132*  CREATININE 4.31* 5.11*  --  5.87* 5.10* 5.20*  CALCIUM 7.4* 6.4*  --  5.7* 5.5* 6.0*  MG  --   --  2.7*  --  2.6* 2.8*  PHOS 5.6* 6.8*  --   --   --  5.5*    Liver Function Tests:  Recent Labs Lab 07/24/16 0551 07/25/16 0603 07/29/16 0347  ALBUMIN 3.7 3.7 3.7   No results for input(s): LIPASE, AMYLASE in the last 168 hours. No  results for input(s): AMMONIA in the last 168 hours.  CBC:  Recent Labs Lab 07/27/16 0433 07/28/16 0540  WBC 23.1* 29.7*  HGB 10.3* 10.2*  HCT 33.0* 31.0*  MCV 93.2 90.9  PLT 687* 727*    Cardiac Enzymes: No results for input(s): CKTOTAL, CKMB, CKMBINDEX, TROPONINI in the last 168 hours.  BNP: Invalid input(s): POCBNP  CBG:  Recent Labs Lab 07/28/16 1122 07/28/16 1624 07/28/16 2127 07/29/16 0807 07/29/16 1200  GLUCAP 156* 124* 158* 74 82    Microbiology: Results for orders placed or performed during the hospital encounter of 03/27/16  Culture, blood (Routine x 2)     Status: None   Collection Time: 03/27/16  5:35 PM  Result Value Ref Range Status   Specimen Description BLOOD LEFT ANTECUBITAL  Final   Special Requests BOTTLES DRAWN AEROBIC AND ANAEROBIC 5CC  Final   Culture NO GROWTH 5 DAYS  Final   Report Status 04/01/2016 FINAL  Final  Culture, blood (Routine x 2)     Status: None   Collection Time: 03/27/16  5:55 PM  Result Value Ref Range Status   Specimen Description BLOOD RIGHT ANTECUBITAL  Final  Special Requests BOTTLES DRAWN AEROBIC AND ANAEROBIC 5CC  Final   Culture NO GROWTH 5 DAYS  Final   Report Status 04/01/2016 FINAL  Final  Urine culture     Status: Abnormal   Collection Time: 03/27/16  6:05 PM  Result Value Ref Range Status   Specimen Description URINE, RANDOM  Final   Special Requests NONE  Final   Culture <10,000 COLONIES/mL INSIGNIFICANT GROWTH (A)  Final   Report Status 03/29/2016 FINAL  Final    Coagulation Studies: No results for input(s): LABPROT, INR in the last 72 hours.  Urinalysis: No results for input(s): COLORURINE, LABSPEC, PHURINE, GLUCOSEU, HGBUR, BILIRUBINUR, KETONESUR, PROTEINUR, UROBILINOGEN, NITRITE, LEUKOCYTESUR in the last 72 hours.  Invalid input(s): APPERANCEUR    Imaging: No results found.   Medications:   . [MAR Hold] sodium chloride    . sodium chloride     . [MAR Hold] amiodarone  400 mg Oral BID   . [MAR Hold] aspirin  81 mg Oral Daily  . [MAR Hold] atorvastatin  40 mg Oral Daily  . [MAR Hold] calcitRIOL  0.25 mcg Oral Daily  . [MAR Hold] calcium carbonate  500 mg of elemental calcium Oral BID  . [MAR Hold] heparin subcutaneous  5,000 Units Subcutaneous Q8H  . [MAR Hold] insulin aspart  0-5 Units Subcutaneous QHS  . [MAR Hold] insulin aspart  0-9 Units Subcutaneous TID WC  . [MAR Hold] insulin aspart  3 Units Subcutaneous TID WC  . [MAR Hold] insulin detemir  12 Units Subcutaneous QHS  . [MAR Hold] metoprolol succinate  25 mg Oral Daily  . [MAR Hold] patiromer  8.4 g Oral Daily  . [MAR Hold] predniSONE  20 mg Oral Q breakfast  . [MAR Hold] sodium chloride flush  3 mL Intravenous Q12H  . [MAR Hold] sodium chloride flush  3 mL Intravenous Q12H  . [MAR Hold] tiotropium  18 mcg Inhalation q morning - 10a  . tuberculin  5 Units Intradermal Once   [MAR Hold] sodium chloride, [MAR Hold] acetaminophen **OR** [MAR Hold] acetaminophen, [MAR Hold] albuterol, [MAR Hold] bisacodyl, [MAR Hold] diphenhydrAMINE, [MAR Hold] guaiFENesin-codeine, [MAR Hold] ipratropium-albuterol, [MAR Hold] meclizine, [MAR Hold] ondansetron **OR** [MAR Hold] ondansetron (ZOFRAN) IV, [MAR Hold] oxyCODONE-acetaminophen, [MAR Hold] senna-docusate, [MAR Hold] sodium chloride flush  Assessment/ Plan:  [name] is a 71 y.o. black male with alcoholism, systolic congestive heart failure, CVA, hypertension, coronary artery disease, hyperlipidemia, who was admitted to [facility] on 07/21/2016  1. Acute renal failure  Acute renal failure secondary to acute cardiorenal syndrome  Baseline creatinine 1.32.  Jul 21, 2016 Serum creatinine possibly peaked at 5.87.  BUN/Cr remain critically high  Permcath placement today  Dialysis today  2. Acute exacerbation of systolic congestive heart failure: echo 12/17 EF 15-20%., pulmonary Hypertension holding diuretics. No signs of volume overload - Monitor volume status.   3.  COPD acute exacerbation:  - steroids and supportive care.   4. Acidosis - d/c bicarb drip - monitor - expected to improve with HD  5. Hypocalcemia -  vitamin D - calcium supplements 500 twice a day      LOS: 8 Bruce [name] 4/19/20184:36 PM

## 2016-07-29 NOTE — H&P (Signed)
[facility] Vascular Consult Note  MRN : [id number]  Bruce [name] is a 71 y.o. (Sep 23, 1945) male who presents with chief complaint of  Chief Complaint  Patient presents with  . Shortness of Breath  .  History of Present Illness: I am asked to see the patient by Dr. Candiss Norse from nephrology for placement of the dialysis catheter. Patient is 71 year old male who was admitted over a week ago for shortness of breath and productive cough. He had chronic kidney disease which was known as a baseline which has been worse on this admission and has not improved despite optimal medical management. He now has what is felt to be end-stage renal disease and needs a dialysis catheter. He is still short of breath with minimal activity. He still has swelling. He does not have any fever or chills.  Current Facility-Administered Medications  Medication Dose Route Frequency Provider Last Rate Last Dose  . 0.9 %  sodium chloride infusion  250 mL Intravenous PRN Demetrios Loll, MD      . 0.9 %  sodium chloride infusion   Intravenous Continuous Algernon Huxley, MD      . acetaminophen (TYLENOL) tablet 650 mg  650 mg Oral Q6H PRN Demetrios Loll, MD       Or  . acetaminophen (TYLENOL) suppository 650 mg  650 mg Rectal Q6H PRN Demetrios Loll, MD      . albuterol (PROVENTIL) (2.5 MG/3ML) 0.083% nebulizer solution 2.5 mg  2.5 mg Nebulization Q2H PRN Demetrios Loll, MD   2.5 mg at 07/23/16 0507  . amiodarone (PACERONE) tablet 400 mg  400 mg Oral BID Deboraha Sprang, MD   400 mg at 07/29/16 1010  . aspirin chewable tablet 81 mg  81 mg Oral Daily Demetrios Loll, MD   81 mg at 07/28/16 1300  . atorvastatin (LIPITOR) tablet 40 mg  40 mg Oral Daily Demetrios Loll, MD   40 mg at 07/29/16 1012  . bisacodyl (DULCOLAX) EC tablet 5 mg  5 mg Oral Daily PRN Demetrios Loll, MD      . calcitRIOL (ROCALTROL) capsule 0.25 mcg  0.25 mcg Oral Daily Harmeet Singh, MD   0.25 mcg at 07/29/16 1010  . calcium carbonate (TUMS - dosed in mg elemental  calcium) chewable tablet 500 mg of elemental calcium  500 mg of elemental calcium Oral BID Lenis Noon, RPH   500 mg of elemental calcium at 07/29/16 1013  . ceFAZolin (ANCEF) IVPB 1 g/50 mL premix  1 g Intravenous Once Algernon Huxley, MD      . diphenhydrAMINE (BENADRYL) capsule 25 mg  25 mg Oral Q6H PRN Demetrios Loll, MD   25 mg at 07/26/16 2239  . guaiFENesin-codeine 100-10 MG/5ML solution 10 mL  10 mL Oral Q6H PRN Dustin Flock, MD   10 mL at 07/27/16 0834  . heparin injection 5,000 Units  5,000 Units Subcutaneous Q8H Loletha Grayer, MD   5,000 Units at 07/29/16 0559  . insulin aspart (novoLOG) injection 0-5 Units  0-5 Units Subcutaneous QHS Demetrios Loll, MD   2 Units at 07/26/16 2152  . insulin aspart (novoLOG) injection 0-9 Units  0-9 Units Subcutaneous TID WC Demetrios Loll, MD   1 Units at 07/28/16 1656  . insulin aspart (novoLOG) injection 3 Units  3 Units Subcutaneous TID WC Max Sane, MD   3 Units at 07/28/16 1657  . insulin detemir (LEVEMIR) injection 12 Units  12 Units Subcutaneous QHS Max Sane, MD  12 Units at 07/28/16 2148  . ipratropium-albuterol (DUONEB) 0.5-2.5 (3) MG/3ML nebulizer solution 3 mL  3 mL Nebulization Q6H PRN Loletha Grayer, MD   3 mL at 07/28/16 0835  . meclizine (ANTIVERT) tablet 25 mg  25 mg Oral TID PRN Demetrios Loll, MD      . metoprolol succinate (TOPROL-XL) 24 hr tablet 25 mg  25 mg Oral Daily Demetrios Loll, MD   25 mg at 07/29/16 1010  . ondansetron (ZOFRAN) tablet 4 mg  4 mg Oral Q6H PRN Demetrios Loll, MD   4 mg at 07/22/16 0830   Or  . ondansetron (ZOFRAN) injection 4 mg  4 mg Intravenous Q6H PRN Demetrios Loll, MD   4 mg at 07/24/16 1934  . oxyCODONE-acetaminophen (PERCOCET/ROXICET) 5-325 MG per tablet 1 tablet  1 tablet Oral Q6H PRN Dustin Flock, MD   1 tablet at 07/28/16 2036  . patiromer Daryll Drown) packet 8.4 g  8.4 g Oral Daily Lavonia Dana, MD   8.4 g at 07/28/16 1315  . [START ON 07/30/2016] predniSONE (DELTASONE) tablet 20 mg  20 mg Oral Q breakfast Loletha Grayer, MD       . senna-docusate (Senokot-S) tablet 1 tablet  1 tablet Oral QHS PRN Demetrios Loll, MD      . sodium chloride flush (NS) 0.9 % injection 3 mL  3 mL Intravenous Q12H Demetrios Loll, MD   3 mL at 07/29/16 1013  . sodium chloride flush (NS) 0.9 % injection 3 mL  3 mL Intravenous Q12H Demetrios Loll, MD   3 mL at 07/29/16 1013  . sodium chloride flush (NS) 0.9 % injection 3 mL  3 mL Intravenous PRN Demetrios Loll, MD      . tiotropium [facility]) inhalation capsule 18 mcg  18 mcg Inhalation q morning - 10a Demetrios Loll, MD   18 mcg at 07/29/16 1026  . tuberculin injection 5 Units  5 Units Intradermal Once Murlean Iba, MD   5 Units at 07/27/16 2230    Past Medical History:  Diagnosis Date  . Bell's palsy   . Chronic combined systolic and diastolic CHF, NYHA class 3 ([place])   . CKD (chronic kidney disease), stage II   . CML (chronic myelocytic leukemia) ([place])   . Coronary artery disease, non-occlusive   . Hypercholesterolemia   . Hypertension   . Leukemia ([place])   . Stroke ([place])    No residual limb weakness.  Walks with cane at baseline.   Marland Kitchen TIA (transient ischemic attack) 05/10/2014    Past Surgical History:  Procedure Laterality Date  . BACK SURGERY    . HIP ARTHROPLASTY Right    orif  . ORIF FOREARM FRACTURE Right     Social History Social History  Substance Use Topics  . Smoking status: Former Smoker    Packs/day: 0.50    Years: 50.00    Types: Cigarettes  . Smokeless tobacco: Never Used  . Alcohol use No    Family History Family History  Problem Relation Age of Onset  . Diabetes Mother   . Hypertension Mother   . Diabetes Father   . Hypertension Father   . Diabetes Brother   . Hypertension Brother   . Diabetes Sister   . Hypertension Sister   . Diabetes Brother   . Hypertension Brother   . Diabetes Sister   . Hypertension Sister     No Known Allergies   REVIEW OF SYSTEMS (Negative unless checked)  Constitutional: [] Weight loss  [] Fever  [] Chills Cardiac: []   Chest pain    [] Chest pressure   [x] Palpitations   [x] Shortness of breath when laying flat   [] Shortness of breath at rest   [x] Shortness of breath with exertion. Vascular:  [] Pain in legs with walking   [] Pain in legs at rest   [] Pain in legs when laying flat   [] Claudication   [] Pain in feet when walking  [] Pain in feet at rest  [] Pain in feet when laying flat   [] History of DVT   [] Phlebitis   [x] Swelling in legs   [] Varicose veins   [] Non-healing ulcers Pulmonary:   [] Uses home oxygen   [] Productive cough   [] Hemoptysis   [] Wheeze  [] COPD   [] Asthma Neurologic:  [] Dizziness  [] Blackouts   [] Seizures   [x] History of stroke   [x] History of TIA  [] Aphasia   [] Temporary blindness   [] Dysphagia   [] Weakness or numbness in arms   [] Weakness or numbness in legs Musculoskeletal:  [x] Arthritis   [] Joint swelling   [] Joint pain   [] Low back pain Hematologic:  [] Easy bruising  [] Easy bleeding   [] Hypercoagulable state   [x] Anemic  [] Hepatitis Gastrointestinal:  [] Blood in stool   [] Vomiting blood  [] Gastroesophageal reflux/heartburn   [] Difficulty swallowing. Genitourinary:  [x] Chronic kidney disease   [] Difficult urination  [] Frequent urination  [] Burning with urination   [] Blood in urine Skin:  [] Rashes   [] Ulcers   [] Wounds Psychological:  [] History of anxiety   []  History of major depression.  Physical Examination  Vitals:   07/28/16 1954 07/29/16 0307 07/29/16 0852 07/29/16 1403  BP: 106/69 101/77 112/84 110/88  Pulse: 60 (!) 105 (!) 108 100  Resp: 18 18 18 18   Temp: 97.4 F (36.3 C) 97.7 F (36.5 C) 97.6 F (36.4 C) 97.8 F (36.6 C)  TempSrc: Oral Axillary Oral Oral  SpO2: 92% 98% 99% 99%  Weight:  78.8 kg (173 lb 11.2 oz)    Height:       Body mass index is 26.41 kg/m. Gen:  WD/WN, NAD Head: [place]/AT, No temporalis wasting.  Ear/Nose/Throat: Hearing grossly intact, nares w/o erythema or drainage, oropharynx w/o Erythema/Exudate Eyes: Sclera non-icteric, conjunctiva clear Neck: Trachea midline.  No  JVD.  Pulmonary:  Good air movement, respirations not labored, equal bilaterally.  Cardiac: RRR, normal S1, S2. Vascular:  Vessel Right Left  Radial Palpable Palpable                                   Gastrointestinal: soft, non-tender/non-distended. Musculoskeletal: M/S 5/5 throughout.  Extremities without ischemic changes.  No deformity or atrophy.1+ lower extremity edema Neurologic: Sensation grossly intact in extremities.  Symmetrical.  Speech is fluent. Motor exam as listed above. Psychiatric: Judgment intact, Mood & affect appropriate for pt's clinical situation. Dermatologic: No rashes or ulcers noted.  No cellulitis or open wounds.      CBC Lab Results  Component Value Date   WBC 29.7 (H) 07/28/2016   HGB 10.2 (L) 07/28/2016   HCT 31.0 (L) 07/28/2016   MCV 90.9 07/28/2016   PLT 727 (H) 07/28/2016    BMET    Component Value Date/Time   NA 138 07/29/2016 0347   NA 142 07/31/2014 1244   K 4.9 07/29/2016 0347   K 4.3 07/31/2014 1244   CL 97 (L) 07/29/2016 0347   CO2 22 07/29/2016 0347   CO2 22 07/31/2014 1244   GLUCOSE 106 (H) 07/29/2016 0347   GLUCOSE 133 07/31/2014  1244   BUN 132 (H) 07/29/2016 0347   BUN 18.3 07/31/2014 1244   CREATININE 5.20 (H) 07/29/2016 0347   CREATININE 1.2 07/31/2014 1244   CALCIUM 6.0 (LL) 07/29/2016 0347   CALCIUM 9.2 07/31/2014 1244   GFRNONAA 10 (L) 07/29/2016 0347   GFRAA 12 (L) 07/29/2016 0347   Estimated Creatinine Clearance: 12.8 mL/min (A) (by C-G formula based on SCr of 5.2 mg/dL (H)).  COAG Lab Results  Component Value Date   INR 1.19 03/27/2016   INR 1.28 11/10/2015   INR 1.78 (H) 10/11/2015    Radiology Dg Chest 2 View  Result Date: 07/23/2016 CLINICAL DATA:  CHF, chronic cough. EXAM: CHEST  2 VIEW COMPARISON:  07/21/2016 FINDINGS: Cardiomegaly. No confluent opacities, effusions or edema. No acute bony abnormality. IMPRESSION: Cardiomegaly.  No active disease. Electronically Signed   By: Rolm Baptise M.D.    On: 07/23/2016 10:45   Dg Chest 2 View  Result Date: 07/21/2016 CLINICAL DATA:  Two months of generalize weakness, lightheadedness, shortness of breath, and intermittent chest pain. Chronic cough. History of leukemia, former smoker. EXAM: CHEST  2 VIEW COMPARISON:  Chest x-ray of May 02, 2016 FINDINGS: The lungs are well-expanded. There is no focal infiltrate. There is no pleural effusion. The cardiac silhouette is mildly enlarged but stable. The central pulmonary vascularity is prominent but there is no definite cephalization. There is calcification in the wall of the aortic arch. The mediastinum is normal in width. There is multilevel degenerative disc disease of the thoracic spine. IMPRESSION: Stable cardiomegaly without pulmonary edema. No pneumonia nor other acute cardiopulmonary abnormality. Thoracic aortic atherosclerosis. Electronically Signed   By: David  Martinique M.D.   On: 07/21/2016 09:55      Assessment/Plan 1. ESRD. Asked by nephrology to place a PermCath for dialysis. This will be placed today. Risks and benefits discussed with the patient. Outpatient workup for AV fistula or graft placement was also discussed 2. Leukemia. Likely the cause of his markedly elevated white blood cell count. May make him somewhat more hypercoagulable for a fistula or graft, but should not affect the catheter much. 3. History of stroke. No obvious residual deficits. 4 hypertension. Stable on outpatient medications and blood pressure control important in reducing the progression of atherosclerotic disease. On appropriate oral medications.    Leotis Pain, MD  07/29/2016 3:27 PM    This note was created with Dragon medical transcription system.  Any error is purely unintentional

## 2016-07-29 NOTE — Op Note (Signed)
OPERATIVE NOTE    PRE-OPERATIVE DIAGNOSIS: 1. ESRD  POST-OPERATIVE DIAGNOSIS: same as above  PROCEDURE: 1. Ultrasound guidance for vascular access to the right internal jugular vein 2. Fluoroscopic guidance for placement of catheter 3. Placement of a 19 cm tip to cuff tunneled hemodialysis catheter via the right internal jugular vein  SURGEON: Leotis Pain, MD  ANESTHESIA:  Local with Moderate conscious sedation for approximately 15 minutes using 2 mg of Versed and 50 mcg of Fentanyl  ESTIMATED BLOOD LOSS: 20 cc  FLUORO TIME: less than one minute  CONTRAST: none  FINDING(S): 1.  Patent right internal jugular vein  SPECIMEN(S):  None  INDICATIONS:   Bruce [name] is a 71 y.o.male who presents with progression of renal failure and now is likely ESRD.  The patient needs long term dialysis access for their ESRD, and a Permcath is necessary.  Risks and benefits are discussed and informed consent is obtained.    DESCRIPTION: After obtaining full informed written consent, the patient was brought back to the vascular suited. The patient's right neck and chest were sterilely prepped and draped in a sterile surgical field was created. Moderate conscious sedation was administered during a face to face encounter with the patient throughout the procedure with my supervision of the RN administering medicines and monitoring the patient's vital signs, pulse oximetry, telemetry and mental status throughout from the start of the procedure until the patient was taken to the recovery room.  The right internal jugular vein was visualized with ultrasound and found to be patent. It was then accessed under direct ultrasound guidance and a permanent image was recorded. A wire was placed. After skin nick and dilatation, the peel-away sheath was placed over the wire. I then turned my attention to an area under the clavicle. Approximately 1-2 fingerbreadths below the clavicle a small counterincision was  created and tunneled from the subclavicular incision to the access site. Using fluoroscopic guidance, a 19 centimeter tip to cuff tunneled hemodialysis catheter was selected, and tunneled from the subclavicular incision to the access site. It was then placed through the peel-away sheath and the peel-away sheath was removed. Using fluoroscopic guidance the catheter tips were parked in the right atrium. The appropriate distal connectors were placed. It withdrew blood well and flushed easily with heparinized saline and a concentrated heparin solution was then placed. It was secured to the chest wall with 2 Prolene sutures. The access incision was closed single 4-0 Monocryl. A 4-0 Monocryl pursestring suture was placed around the exit site. Sterile dressings were placed. The patient tolerated the procedure well and was taken to the recovery room in stable condition.  COMPLICATIONS: None  CONDITION: Stable  Leotis Pain, MD 07/29/2016 4:18 PM   This note was created with Dragon Medical transcription system. Any errors in dictation are purely unintentional.

## 2016-07-29 NOTE — Progress Notes (Signed)
Calcium 6.0 this AM. Pharmacy managing electrolytes. No new orders at this time.   Iran Sizer M

## 2016-07-29 NOTE — Clinical Social Work Note (Signed)
CSW continuing to follow patient's progress.  Plan is for patient to go to [facility] once he is medically ready for discharge and orders have been received.  Jones Broom. [place], MSW, [place]  07/29/2016 9:33 AM

## 2016-07-29 NOTE — Progress Notes (Signed)
Patient ID: Bruce [name], male   DOB: Sep 21, 1945, 71 y.o.   MRN: [id number]  [facility] PROGRESS NOTE  Bruce [name] WVP:[id number] DOB: 27-Mar-1946 DOA: 07/21/2016 PCP: [name], MD  HPI/Subjective: Patient feels okay. States he feels better. He still has coughing fits. Periods of shortness of breath. Concerned about his kidney function which has not been better.  Objective: Vitals:   07/29/16 1403 07/29/16 1530  BP: 110/88 123/81  Pulse: 100 61  Resp: 18 (!) 23  Temp: 97.8 F (36.6 C)     Filed Weights   07/27/16 0412 07/28/16 0302 07/29/16 0307  Weight: 80.4 kg (177 lb 4.8 oz) 82.2 kg (181 lb 4.8 oz) 78.8 kg (173 lb 11.2 oz)    ROS: Review of Systems  Constitutional: Negative for chills and fever.  Eyes: Negative for blurred vision.  Respiratory: Positive for cough and shortness of breath.   Cardiovascular: Negative for chest pain.  Gastrointestinal: Negative for abdominal pain, constipation, diarrhea, nausea and vomiting.  Genitourinary: Negative for dysuria.  Musculoskeletal: Negative for joint pain.  Neurological: Negative for dizziness and headaches.   Exam: Physical Exam  HENT:  Nose: No mucosal edema.  Mouth/Throat: No oropharyngeal exudate or posterior oropharyngeal edema.  Eyes: Conjunctivae, EOM and lids are normal. Pupils are equal, round, and reactive to light.  Neck: No JVD present. Carotid bruit is not present. No edema present. No thyroid mass and no thyromegaly present.  Cardiovascular: S1 normal and S2 normal.  Exam reveals no gallop.   No murmur heard. Pulses:      Dorsalis pedis pulses are 2+ on the right side, and 2+ on the left side.  Respiratory: No respiratory distress. He has decreased breath sounds in the right lower field and the left lower field. He has no wheezes. He has no rhonchi. He has no rales.  GI: Soft. Bowel sounds are normal. There is no tenderness.  Musculoskeletal:       Right shoulder: He exhibits no swelling.   Lymphadenopathy:    He has no cervical adenopathy.  Neurological: He is alert. No cranial nerve deficit.  Skin: Skin is warm. No rash noted. Nails show no clubbing.  Psychiatric: He has a normal mood and affect.      Data Reviewed: Basic Metabolic Panel:  Recent Labs Lab 07/24/16 0551 07/25/16 0603 07/25/16 2115 07/26/16 0657 07/28/16 0540 07/29/16 0347  NA 132* 133*  --  133* 135 138  K 5.8* 5.1  --  5.0 4.3 4.9  CL 100* 98*  --  95* 96* 97*  CO2 14* 16*  --  19* 25 22  GLUCOSE 245* 226*  --  348* 186* 106*  BUN 82* 100*  --  120* 114* 132*  CREATININE 4.31* 5.11*  --  5.87* 5.10* 5.20*  CALCIUM 7.4* 6.4*  --  5.7* 5.5* 6.0*  MG  --   --  2.7*  --  2.6* 2.8*  PHOS 5.6* 6.8*  --   --   --  5.5*   Liver Function Tests:  Recent Labs Lab 07/24/16 0551 07/25/16 0603 07/29/16 0347  ALBUMIN 3.7 3.7 3.7   CBC:  Recent Labs Lab 07/27/16 0433 07/28/16 0540  WBC 23.1* 29.7*  HGB 10.3* 10.2*  HCT 33.0* 31.0*  MCV 93.2 90.9  PLT 687* 727*   BNP (last 3 results)  Recent Labs  04/04/16 1103 05/02/16 1115 07/21/16 0917  BNP 1,141.8* 2,219.0* 3,630.0*    CBG:  Recent Labs Lab 07/28/16 1122 07/28/16  1624 07/28/16 2127 07/29/16 0807 07/29/16 1200  GLUCAP 156* 124* 158* 74 72     Scheduled Meds: . amiodarone  400 mg Oral BID  . aspirin  81 mg Oral Daily  . atorvastatin  40 mg Oral Daily  . calcitRIOL  0.25 mcg Oral Daily  . calcium carbonate  500 mg of elemental calcium Oral BID  . heparin subcutaneous  5,000 Units Subcutaneous Q8H  . insulin aspart  0-5 Units Subcutaneous QHS  . insulin aspart  0-9 Units Subcutaneous TID WC  . insulin aspart  3 Units Subcutaneous TID WC  . insulin detemir  12 Units Subcutaneous QHS  . metoprolol succinate  25 mg Oral Daily  . patiromer  8.4 g Oral Daily  . [START ON 07/30/2016] predniSONE  20 mg Oral Q breakfast  . sodium chloride flush  3 mL Intravenous Q12H  . sodium chloride flush  3 mL Intravenous Q12H  .  tiotropium  18 mcg Inhalation q morning - 10a  . tuberculin  5 Units Intradermal Once   Continuous Infusions: . sodium chloride    . sodium chloride    .  ceFAZolin (ANCEF) IV      Assessment/Plan:  1. COPD exacerbation. Switch Solu-Medrol to prednisone. Continue budesonide and DuoNeb nebulizer solution 2. Acute kidney injury on chronic kidney disease with hyperkalemia and hyponatremia. Overdiuresis with cardiorenal syndrome as per nephrology. Nephrology to proceed with dialysis. PermCath to be placed today by Dr. Lucky Cowboy vascular surgery. 3. Congestive heart failure, NYHA class III. Been holding diuretics most of the hospital stay secondary to acute kidney injury. This was likely not acute congestive heart failure. B NP can be falsely elevated with people on entresto. 4. Arrhythmia on amiodarone as per cardiology 5. Essential hypertension. Blood pressure stable 6. CML on aspirin 7. Hyperlipidemia unspecified on atorvastatin 8. History of TIA and stroke on aspirin 9. Impaired fasting glucose secondary to steroids on sliding scale  Code Status:     Code Status Orders        Start     Ordered   07/21/16 1207  Full code  Continuous     07/21/16 1206    Code Status History    Date Active Date Inactive Code Status Order ID Comments User Context   05/02/2016  6:59 PM 05/07/2016 10:52 PM Full Code [id number]  Idelle Crouch, MD Inpatient   03/21/2016  6:40 PM 03/23/2016  6:39 PM Full Code [id number]  Idelle Crouch, MD Inpatient   10/10/2015 11:59 PM 10/15/2015  7:08 PM Full Code [id number]  Ivor Costa, MD ED   06/04/2015  1:54 AM 06/06/2015  5:28 PM Full Code [id number]  [name], MD ED   01/25/2015  3:16 AM 01/29/2015  5:17 PM Full Code [id number]  Lavina Hamman, MD ED   09/13/2014  5:07 PM 09/22/2014  6:09 PM Full Code [id number]  Flonnie Overman Dhungel, MD Inpatient   03/20/2014 11:33 PM 03/21/2014  5:46 PM Full Code [id number]  Arne Cleveland, MD Inpatient   03/18/2014 11:46 PM 03/20/2014 11:33 PM  Full Code [id number]  [name], MD Inpatient   03/05/2014  9:04 PM 03/06/2014  6:57 PM Full Code [id number]  [name], MD Inpatient     Disposition Plan: To be determined  Consultants:  Nephrology  Cardiology  Procedures: - Dialysis catheter today  Time spent: 25 minutes  [place], [facility]

## 2016-07-30 ENCOUNTER — Encounter: Payer: Self-pay | Admitting: Vascular Surgery

## 2016-07-30 LAB — BASIC METABOLIC PANEL
ANION GAP: 14 (ref 5–15)
BUN: 114 mg/dL — ABNORMAL HIGH (ref 6–20)
CALCIUM: 6.2 mg/dL — AB (ref 8.9–10.3)
CO2: 26 mmol/L (ref 22–32)
Chloride: 98 mmol/L — ABNORMAL LOW (ref 101–111)
Creatinine, Ser: 4.43 mg/dL — ABNORMAL HIGH (ref 0.61–1.24)
GFR calc non Af Amer: 12 mL/min — ABNORMAL LOW (ref >60)
GFR, EST AFRICAN AMERICAN: 14 mL/min — AB (ref >60)
Glucose, Bld: 231 mg/dL — ABNORMAL HIGH (ref 65–99)
Potassium: 4.7 mmol/L (ref 3.5–5.1)
SODIUM: 138 mmol/L (ref 135–145)

## 2016-07-30 LAB — GLUCOSE, CAPILLARY
GLUCOSE-CAPILLARY: 219 mg/dL — AB (ref 65–99)
GLUCOSE-CAPILLARY: 212 mg/dL — AB (ref 65–99)
GLUCOSE-CAPILLARY: 281 mg/dL — AB (ref 65–99)
GLUCOSE-CAPILLARY: 322 mg/dL — AB (ref 65–99)

## 2016-07-30 MED ORDER — PREDNISONE 10 MG PO TABS
10.0000 mg | ORAL_TABLET | Freq: Every day | ORAL | Status: DC
  Administered 2016-07-31 – 2016-08-04 (×5): 10 mg via ORAL
  Filled 2016-07-30 (×5): qty 1

## 2016-07-30 NOTE — Progress Notes (Signed)
Post hd assessment 

## 2016-07-30 NOTE — Progress Notes (Signed)
Post hd vitals 

## 2016-07-30 NOTE — Progress Notes (Signed)
Pre hd info 

## 2016-07-30 NOTE — Progress Notes (Signed)
Progress Note  Patient Name: Bruce [name] Date of Encounter: 07/30/2016  Primary Cardiologist: Dr. Terrence Dupont (Followed by Dr. Rockey Situ this admission)  Subjective   Reports breathing is at baseline. No chest pain or palpitations. Reports pain along his neck where HD catheter was placed. Says HD is going well, as he "sleeps right through it".   Inpatient Medications    Scheduled Meds: . amiodarone  400 mg Oral BID  . aspirin  81 mg Oral Daily  . atorvastatin  40 mg Oral Daily  . calcitRIOL  0.25 mcg Oral Daily  . calcium carbonate  500 mg of elemental calcium Oral BID  . heparin subcutaneous  5,000 Units Subcutaneous Q8H  . insulin aspart  0-5 Units Subcutaneous QHS  . insulin aspart  0-9 Units Subcutaneous TID WC  . metoprolol succinate  25 mg Oral Daily  . [START ON 07/31/2016] predniSONE  10 mg Oral Q breakfast  . sodium chloride flush  3 mL Intravenous Q12H  . sodium chloride flush  3 mL Intravenous Q12H  . tiotropium  18 mcg Inhalation q morning - 10a   Continuous Infusions: . sodium chloride     PRN Meds: sodium chloride, acetaminophen **OR** acetaminophen, albuterol, bisacodyl, diphenhydrAMINE, guaiFENesin-codeine, ipratropium-albuterol, meclizine, ondansetron **OR** ondansetron (ZOFRAN) IV, oxyCODONE-acetaminophen, senna-docusate, sodium chloride flush   Vital Signs    Vitals:   07/30/16 1150 07/30/16 1225 07/30/16 1230 07/30/16 1256  BP: (!) 109/54 (!) 109/55 107/67 110/72  Pulse: 60 60 60 64  Resp: 12 17 16    Temp:  97.5 F (36.4 C)    TempSrc:  Oral    SpO2: 100% 100% 100%   Weight:      Height:        Intake/Output Summary (Last 24 hours) at 07/30/16 1411 Last data filed at 07/30/16 1225  Gross per 24 hour  Intake              110 ml  Output             2125 ml  Net            -2015 ml   Filed Weights   07/29/16 2104 07/29/16 2145 07/30/16 0950  Weight: 176 lb 12.9 oz (80.2 kg) 172 lb (78 kg) 172 lb 2.9 oz (78.1 kg)    Telemetry    NSR, HR  in mid-50's to 60's. Occasional PAC's.  - Personally Reviewed  ECG    No new tracings.   Physical Exam   General: Well developed, well nourished African American male appearing in no acute distress. Head: Normocephalic, atraumatic.  Neck: Supple without bruits, JVD not elevated. Catheter in place.  Lungs:  Resp regular and unlabored, CTA without wheezing or rales. Heart: RRR, S1, S2, no S3, S4, or murmur; no rub. Abdomen: Soft, non-tender, non-distended with normoactive bowel sounds. No hepatomegaly. No rebound/guarding. No obvious abdominal masses. Extremities: No clubbing, cyanosis, or lower extremity edema. Distal pedal pulses are 2+ bilaterally. Neuro: Alert and oriented X 3. Moves all extremities spontaneously. Psych: Normal affect.  Labs    Chemistry Recent Labs Lab 07/24/16 0551 07/25/16 0603  07/28/16 0540 07/29/16 0347 07/30/16 0540  NA 132* 133*  < > 135 138 138  K 5.8* 5.1  < > 4.3 4.9 4.7  CL 100* 98*  < > 96* 97* 98*  CO2 14* 16*  < > 25 22 26   GLUCOSE 245* 226*  < > 186* 106* 231*  BUN 82* 100*  < >  114* 132* 114*  CREATININE 4.31* 5.11*  < > 5.10* 5.20* 4.43*  CALCIUM 7.4* 6.4*  < > 5.5* 6.0* 6.2*  ALBUMIN 3.7 3.7  --   --  3.7  --   GFRNONAA 13* 10*  < > 10* 10* 12*  GFRAA 15* 12*  < > 12* 12* 14*  ANIONGAP 18* 19*  < > 14 19* 14  < > = values in this interval not displayed.   Hematology Recent Labs Lab 07/27/16 0433 07/28/16 0540  WBC 23.1* 29.7*  RBC 3.54* 3.41*  HGB 10.3* 10.2*  HCT 33.0* 31.0*  MCV 93.2 90.9  MCH 29.0 29.9  MCHC 31.1* 32.9  RDW 20.3* 20.5*  PLT 687* 727*    Cardiac EnzymesNo results for input(s): TROPONINI in the last 168 hours. No results for input(s): TROPIPOC in the last 168 hours.   BNPNo results for input(s): BNP, PROBNP in the last 168 hours.   DDimer No results for input(s): DDIMER in the last 168 hours.   Radiology    Dg Chest 1 View  Result Date: 07/29/2016 CLINICAL DATA:  Central line placement. EXAM:  CHEST 1 VIEW COMPARISON:  07/23/2016. FINDINGS: Decreased inspiration. No significant change in enlargement of the cardiac silhouette with a globular configuration. Interval right jugular catheter with its tip in the upper right atrium. No pneumothorax. Clear lungs with normal vascularity. Thoracic spine degenerative changes. IMPRESSION: 1. Right jugular catheter tip in the upper right atrium. This could be retracted 3 cm to place it at the superior cavoatrial junction. 2. Cardiomegaly with a globular configuration, suggesting the possibility of a pericardial effusion. Electronically Signed   By: Claudie Revering M.D.   On: 07/29/2016 18:18    Cardiac Studies   Echocardiogram: 03/22/2016 Study Conclusions  - Left ventricle: The cavity size was mildly dilated. Wall   thickness was normal. Systolic function was severely reduced. The   estimated ejection fraction was in the range of 15% to 20%.   Diffuse hypokinesis. The study is not technically sufficient to   allow evaluation of LV diastolic function. - Mitral valve: There was mild regurgitation. - Left atrium: The atrium was mildly to moderately dilated. - Right ventricle: The cavity size was mildly dilated. Wall   thickness was normal. Systolic function was moderately reduced. - Right atrium: The atrium was moderately dilated. - Tricuspid valve: Mildly thickened leaflets. There was   moderate-severe regurgitation. - Pulmonic valve: There was moderate regurgitation. - Pulmonary arteries: Systolic pressure was moderately increased.   PA peak pressure: 50 mm Hg (S).   Patient Profile     71 y.o. male w/ PMH of chronic combined systolic and diastolic CHF, NICM (nonobstructive CAD by cath in 2016), atrial tachycardia, prior CVA, CML, prior substance use and medical noncompliance who presented to [facility] on 07/21/2016 for progressive dyspnea on exertion.   Assessment & Plan    1. Acute on chronic combined systolic and diastolic CHF - has a known  severely reduced EF of 15-20% by his last echo with Grade 3 DD.  - admitted for worsening dyspnea on exertion but developed acute renal failure following initiation of diuresis. Creatinine peaked at 5.87 and he has been started on HD by Nephrology.  - Have placed an order for co-ox to be checked off his PermCath if possible while in HD to examine for low-output heart failure. If found to have low-output HF, Milrinone would be preferred over Dobutamine with his recent arrhythmias.  - continue BB. Consider addition of  BiDil if BP allows as pressures have been soft this admission. Volume management per HD.   2. Elevated Troponin - values peaked at 0.10 this admission. Likely secondary to demand/supply mismatch in the setting of significant volume overload.  - cath in 2016 with nonobstructive CAD.  - no further plans for ischemic evaluation at this time.   3. Nonischemic Cardiomyopathy  - cath in 2016 with nonobstructive CAD. Echo in 03/2016 showed an EF of 15-20%, recent imaging at [facility] in 05/2016 with similar results.  - continue Toprol-XL. No ACE-I/ARB/ARNI secondary to AKI. Consider addition of Hydralazine and Nitrates if BP allows.   4. Atrial Tachycardia - evaluated by Dr. Caryl Comes on 07/29/2016 and started on Amiodarone at that time. Will continue on Amiodarone 400mg  BID for 2 weeks then switch to 200mg  daily (on 08/12/2016).  - is maintaining NSR by review of telemetry.   5. CML - followed by Hematology at [facility].  6. Acute on Chronic Stage 3 CKD - creatinine at 1.32 on admission, peaked at 5.87 on 07/26/2016. - underwent PermCath placement on 4/19. Has been started on HD.  - per Nephrology.   7. COPD Exacerbation - per admitting team  8. Homeless/ History of Polysubstance abuse: - prior cocaine use. Denies any recent use and UDS negative this admission. Cessation advised.    Signed, Erma Heritage , PA-C 2:11 PM 07/30/2016 Pager: [phone]

## 2016-07-30 NOTE — Progress Notes (Signed)
  End of hd 

## 2016-07-30 NOTE — Progress Notes (Signed)
Patient ID: Bruce [name], male   DOB: [birth year], 71 y.o.   MRN: [id number]  [facility] PROGRESS NOTE  Bruce [name] VOP:[id number] DOB: 09/19/1945 DOA: 07/21/2016 PCP: [name], MD  HPI/Subjective: Patient breathing better, some cough but less.  Feeling better  Objective: Vitals:   07/30/16 1230 07/30/16 1256  BP: 107/67 110/72  Pulse: 60 64  Resp: 16   Temp:      Filed Weights   07/29/16 2104 07/29/16 2145 07/30/16 0950  Weight: 80.2 kg (176 lb 12.9 oz) 78 kg (172 lb) 78.1 kg (172 lb 2.9 oz)    ROS: Review of Systems  Constitutional: Negative for chills and fever.  Eyes: Negative for blurred vision.  Respiratory: Positive for cough and shortness of breath.   Cardiovascular: Negative for chest pain.  Gastrointestinal: Negative for abdominal pain, constipation, diarrhea, nausea and vomiting.  Genitourinary: Negative for dysuria.  Musculoskeletal: Negative for joint pain.  Neurological: Negative for dizziness and headaches.   Exam: Physical Exam  HENT:  Nose: No mucosal edema.  Mouth/Throat: No oropharyngeal exudate or posterior oropharyngeal edema.  Eyes: Conjunctivae, EOM and lids are normal. Pupils are equal, round, and reactive to light.  Neck: No JVD present. Carotid bruit is not present. No edema present. No thyroid mass and no thyromegaly present.  Cardiovascular: S1 normal and S2 normal.  Exam reveals no gallop.   No murmur heard. Pulses:      Dorsalis pedis pulses are 2+ on the right side, and 2+ on the left side.  Respiratory: No respiratory distress. He has decreased breath sounds in the right lower field. He has no wheezes. He has no rhonchi. He has no rales.  GI: Soft. Bowel sounds are normal. There is no tenderness.  Musculoskeletal:       Right shoulder: He exhibits no swelling.  Lymphadenopathy:    He has no cervical adenopathy.  Neurological: He is alert. No cranial nerve deficit.  Skin: Skin is warm. No rash noted. Nails show no  clubbing.  Psychiatric: He has a normal mood and affect.      Data Reviewed: Basic Metabolic Panel:  Recent Labs Lab 07/24/16 0551 07/25/16 0603 07/25/16 2115 07/26/16 0657 07/28/16 0540 07/29/16 0347 07/30/16 0540  NA 132* 133*  --  133* 135 138 138  K 5.8* 5.1  --  5.0 4.3 4.9 4.7  CL 100* 98*  --  95* 96* 97* 98*  CO2 14* 16*  --  19* 25 22 26   GLUCOSE 245* 226*  --  348* 186* 106* 231*  BUN 82* 100*  --  120* 114* 132* 114*  CREATININE 4.31* 5.11*  --  5.87* 5.10* 5.20* 4.43*  CALCIUM 7.4* 6.4*  --  5.7* 5.5* 6.0* 6.2*  MG  --   --  2.7*  --  2.6* 2.8*  --   PHOS 5.6* 6.8*  --   --   --  5.5*  --    Liver Function Tests:  Recent Labs Lab 07/24/16 0551 07/25/16 0603 07/29/16 0347  ALBUMIN 3.7 3.7 3.7   CBC:  Recent Labs Lab 07/27/16 0433 07/28/16 0540  WBC 23.1* 29.7*  HGB 10.3* 10.2*  HCT 33.0* 31.0*  MCV 93.2 90.9  PLT 687* 727*   BNP (last 3 results)  Recent Labs  04/04/16 1103 05/02/16 1115 07/21/16 0917  BNP 1,141.8* 2,219.0* 3,630.0*    CBG:  Recent Labs Lab 07/29/16 1200 07/29/16 1702 07/29/16 2146 07/30/16 0758 07/30/16 1250  GLUCAP 72 84 156*  219* 212*     Scheduled Meds: . amiodarone  400 mg Oral BID  . aspirin  81 mg Oral Daily  . atorvastatin  40 mg Oral Daily  . calcitRIOL  0.25 mcg Oral Daily  . calcium carbonate  500 mg of elemental calcium Oral BID  . heparin subcutaneous  5,000 Units Subcutaneous Q8H  . insulin aspart  0-5 Units Subcutaneous QHS  . insulin aspart  0-9 Units Subcutaneous TID WC  . metoprolol succinate  25 mg Oral Daily  . [START ON 07/31/2016] predniSONE  10 mg Oral Q breakfast  . sodium chloride flush  3 mL Intravenous Q12H  . sodium chloride flush  3 mL Intravenous Q12H  . tiotropium  18 mcg Inhalation q morning - 10a   Continuous Infusions: . sodium chloride      Assessment/Plan:  1.  Acute kidney injury on chronic kidney disease with hyperkalemia and hyponatremia. Overdiuresis with  cardiorenal syndrome as per nephrology. Finished second dialysis today. 2. Copd exacerbation- taper solumedrol to 10mg  for tomorrow 3. Congestive heart failure, NYHA class III. Been holding diuretics most of the hospital stay secondary to acute kidney injury. This was likely not acute congestive heart failure. B NP can be falsely elevated with people on entresto. 4.  Arrhythmia on amiodarone as per cardiology 5.  Essential hypertension. Blood pressure stable 6.  CML on aspirin 7.  Hyperlipidemia unspecified on atorvastatin 8.  History of TIA and stroke on aspirin 9.  Impaired fasting glucose secondary to steroids on sliding scale  Code Status:     Code Status Orders        Start     Ordered   07/21/16 1207  Full code  Continuous     07/21/16 1206    Code Status History    Date Active Date Inactive Code Status Order ID Comments User Context   05/02/2016  6:59 PM 05/07/2016 10:52 PM Full Code [id number]  Idelle Crouch, MD Inpatient   03/21/2016  6:40 PM 03/23/2016  6:39 PM Full Code [id number]  Idelle Crouch, MD Inpatient   10/10/2015 11:59 PM 10/15/2015  7:08 PM Full Code [id number]  Ivor Costa, MD ED   06/04/2015  1:54 AM 06/06/2015  5:28 PM Full Code [id number]  [name], MD ED   01/25/2015  3:16 AM 01/29/2015  5:17 PM Full Code [id number]  Bruce Hamman, MD ED   09/13/2014  5:07 PM 09/22/2014  6:09 PM Full Code [id number]  Flonnie Overman Dhungel, MD Inpatient   03/20/2014 11:33 PM 03/21/2014  5:46 PM Full Code [id number]  Arne Cleveland, MD Inpatient   03/18/2014 11:46 PM 03/20/2014 11:33 PM Full Code [id number]  [name], MD Inpatient   03/05/2014  9:04 PM 03/06/2014  6:57 PM Full Code [id number]  [name], MD Inpatient     Disposition Plan: To be determined  Consultants:  Nephrology  Cardiology  Procedures: - Dialysis catheter  Time spent: 24 minutes  [place], [facility]

## 2016-07-30 NOTE — Progress Notes (Signed)
Hd start 

## 2016-07-30 NOTE — Progress Notes (Signed)
[facility]  ROUNDING NOTE   Subjective:   Patient is overall doing fair No acute shortness of breath 2nd dialysis treatment today   HEMODIALYSIS FLOWSHEET:  Blood Flow Rate (mL/min): 250 mL/min Arterial Pressure (mmHg): -90 mmHg Venous Pressure (mmHg): 80 mmHg Transmembrane Pressure (mmHg): 50 mmHg Ultrafiltration Rate (mL/min): 600 mL/min Dialysate Flow Rate (mL/min): 500 ml/min Conductivity: Machine : 13.7 Conductivity: Machine : 13.7 Dialysis Fluid Bolus: Normal Saline Bolus Amount (mL): 250 mL Dialysate Change:  (3k) Intra-Hemodialysis Comments: 1500. ended     Objective:  Vital signs in last 24 hours:  Temp:  [97.4 F (36.3 C)-98.8 F (37.1 C)] 97.5 F (36.4 C) (04/20 1225) Pulse Rate:  [57-100] 60 (04/20 1230) Resp:  [9-63] 16 (04/20 1230) BP: (102-130)/(54-88) 107/67 (04/20 1230) SpO2:  [93 %-100 %] 100 % (04/20 1230) Weight:  [78 kg (172 lb)-80.4 kg (177 lb 4 oz)] 78.1 kg (172 lb 2.9 oz) (04/20 0950)  Weight change: 1.61 kg (3 lb 8.8 oz) Filed Weights   07/29/16 2104 07/29/16 2145 07/30/16 0950  Weight: 80.2 kg (176 lb 12.9 oz) 78 kg (172 lb) 78.1 kg (172 lb 2.9 oz)    Intake/Output: I/O last 3 completed shifts: In: 240 [P.O.:240] Out: 1425 [Urine:925; Other:500]   Intake/Output this shift:  Total I/O In: 110 [P.O.:110] Out: 1000 [Other:1000]  Physical Exam: General: NAD  Head: Normocephalic, atraumatic. Moist oral mucosal membranes  Eyes: Anicteric,    Neck: Supple, trachea midline  Lungs:  Clear to auscultation bilaterally   Heart: Regular rate and rhythm  Abdomen:  Soft, nontender,   Extremities: No peripheral edema.  Neurologic: Nonfocal, moving all four extremities  Skin: No lesions   Rt IJ PC    Basic Metabolic Panel:  Recent Labs Lab 07/24/16 0551 07/25/16 0603 07/25/16 2115 07/26/16 0657 07/28/16 0540 07/29/16 0347 07/30/16 0540  NA 132* 133*  --  133* 135 138 138  K 5.8* 5.1  --  5.0 4.3 4.9 4.7  CL 100*  98*  --  95* 96* 97* 98*  CO2 14* 16*  --  19* 25 22 26   GLUCOSE 245* 226*  --  348* 186* 106* 231*  BUN 82* 100*  --  120* 114* 132* 114*  CREATININE 4.31* 5.11*  --  5.87* 5.10* 5.20* 4.43*  CALCIUM 7.4* 6.4*  --  5.7* 5.5* 6.0* 6.2*  MG  --   --  2.7*  --  2.6* 2.8*  --   PHOS 5.6* 6.8*  --   --   --  5.5*  --     Liver Function Tests:  Recent Labs Lab 07/24/16 0551 07/25/16 0603 07/29/16 0347  ALBUMIN 3.7 3.7 3.7   No results for input(s): LIPASE, AMYLASE in the last 168 hours. No results for input(s): AMMONIA in the last 168 hours.  CBC:  Recent Labs Lab 07/27/16 0433 07/28/16 0540  WBC 23.1* 29.7*  HGB 10.3* 10.2*  HCT 33.0* 31.0*  MCV 93.2 90.9  PLT 687* 727*    Cardiac Enzymes: No results for input(s): CKTOTAL, CKMB, CKMBINDEX, TROPONINI in the last 168 hours.  BNP: Invalid input(s): POCBNP  CBG:  Recent Labs Lab 07/29/16 0807 07/29/16 1200 07/29/16 1702 07/29/16 2146 07/30/16 0758  GLUCAP 74 72 84 156* 219*    Microbiology: Results for orders placed or performed during the hospital encounter of 03/27/16  Culture, blood (Routine x 2)     Status: None   Collection Time: 03/27/16  5:35 PM  Result Value Ref  Range Status   Specimen Description BLOOD LEFT ANTECUBITAL  Final   Special Requests BOTTLES DRAWN AEROBIC AND ANAEROBIC 5CC  Final   Culture NO GROWTH 5 DAYS  Final   Report Status 04/01/2016 FINAL  Final  Culture, blood (Routine x 2)     Status: None   Collection Time: 03/27/16  5:55 PM  Result Value Ref Range Status   Specimen Description BLOOD RIGHT ANTECUBITAL  Final   Special Requests BOTTLES DRAWN AEROBIC AND ANAEROBIC 5CC  Final   Culture NO GROWTH 5 DAYS  Final   Report Status 04/01/2016 FINAL  Final  Urine culture     Status: Abnormal   Collection Time: 03/27/16  6:05 PM  Result Value Ref Range Status   Specimen Description URINE, RANDOM  Final   Special Requests NONE  Final   Culture <10,000 COLONIES/mL INSIGNIFICANT GROWTH  (A)  Final   Report Status 03/29/2016 FINAL  Final    Coagulation Studies: No results for input(s): LABPROT, INR in the last 72 hours.  Urinalysis: No results for input(s): COLORURINE, LABSPEC, PHURINE, GLUCOSEU, HGBUR, BILIRUBINUR, KETONESUR, PROTEINUR, UROBILINOGEN, NITRITE, LEUKOCYTESUR in the last 72 hours.  Invalid input(s): APPERANCEUR    Imaging: Dg Chest 1 View  Result Date: 07/29/2016 CLINICAL DATA:  Central line placement. EXAM: CHEST 1 VIEW COMPARISON:  07/23/2016. FINDINGS: Decreased inspiration. No significant change in enlargement of the cardiac silhouette with a globular configuration. Interval right jugular catheter with its tip in the upper right atrium. No pneumothorax. Clear lungs with normal vascularity. Thoracic spine degenerative changes. IMPRESSION: 1. Right jugular catheter tip in the upper right atrium. This could be retracted 3 cm to place it at the superior cavoatrial junction. 2. Cardiomegaly with a globular configuration, suggesting the possibility of a pericardial effusion. Electronically Signed   By: Claudie Revering M.D.   On: 07/29/2016 18:18     Medications:   . sodium chloride     . amiodarone  400 mg Oral BID  . aspirin  81 mg Oral Daily  . atorvastatin  40 mg Oral Daily  . calcitRIOL  0.25 mcg Oral Daily  . calcium carbonate  500 mg of elemental calcium Oral BID  . heparin subcutaneous  5,000 Units Subcutaneous Q8H  . insulin aspart  0-5 Units Subcutaneous QHS  . insulin aspart  0-9 Units Subcutaneous TID WC  . insulin aspart  3 Units Subcutaneous TID WC  . insulin detemir  12 Units Subcutaneous QHS  . metoprolol succinate  25 mg Oral Daily  . predniSONE  20 mg Oral Q breakfast  . sodium chloride flush  3 mL Intravenous Q12H  . sodium chloride flush  3 mL Intravenous Q12H  . tiotropium  18 mcg Inhalation q morning - 10a   sodium chloride, acetaminophen **OR** acetaminophen, albuterol, bisacodyl, diphenhydrAMINE, guaiFENesin-codeine,  ipratropium-albuterol, meclizine, ondansetron **OR** ondansetron (ZOFRAN) IV, oxyCODONE-acetaminophen, senna-docusate, sodium chloride flush  Assessment/ Plan:  [name] is a 71 y.o. black male with alcoholism, systolic congestive heart failure, CVA, hypertension, coronary artery disease, hyperlipidemia, who was admitted to [facility] on 07/21/2016  1. Acute renal failure  Acute renal failure secondary to acute cardiorenal syndrome  Baseline creatinine 1.32.  Jul 21, 2016 Serum creatinine possibly peaked at 5.87.  BUN/Cr remain critically high  Permcath placed 4/19  Patient seen during dialysis Tolerating well D/c planning for outpatient dialysis for ARF   2. Acute exacerbation of systolic congestive heart failure: echo 12/17 EF 15-20%., pulmonary Hypertension holding diuretics. No signs  of volume overload - Monitor volume status.   3. COPD acute exacerbation:  - steroids and supportive care.   4. Acidosis - d/c bicarb drip - monitor - expected to improve with HD  5. Hypocalcemia -  vitamin D - calcium supplements 500 twice a day      LOS: 9 Bruce [name] 4/20/201812:34 PM

## 2016-07-30 NOTE — Progress Notes (Signed)
Pre hd assessment  

## 2016-07-30 NOTE — Plan of Care (Addendum)
Charted on wrong patient

## 2016-07-31 LAB — CBC
HCT: 25.1 % — ABNORMAL LOW (ref 40.0–52.0)
HEMATOCRIT: 26.2 % — AB (ref 40.0–52.0)
HEMOGLOBIN: 7.8 g/dL — AB (ref 13.0–18.0)
Hemoglobin: 7.5 g/dL — ABNORMAL LOW (ref 13.0–18.0)
MCH: 28 pg (ref 26.0–34.0)
MCH: 27.7 pg (ref 26.0–34.0)
MCHC: 29.8 g/dL — AB (ref 32.0–36.0)
MCHC: 30.1 g/dL — AB (ref 32.0–36.0)
MCV: 94 fL (ref 80.0–100.0)
MCV: 92.1 fL (ref 80.0–100.0)
PLATELETS: 520 10*3/uL — AB (ref 150–440)
Platelets: 508 10*3/uL — ABNORMAL HIGH (ref 150–440)
RBC: 2.78 MIL/uL — ABNORMAL LOW (ref 4.40–5.90)
RBC: 2.72 MIL/uL — AB (ref 4.40–5.90)
RDW: 20.2 % — ABNORMAL HIGH (ref 11.5–14.5)
RDW: 20.4 % — AB (ref 11.5–14.5)
WBC: 70.7 10*3/uL (ref 3.8–10.6)
WBC: 70.4 10*3/uL (ref 3.8–10.6)

## 2016-07-31 LAB — BASIC METABOLIC PANEL
ANION GAP: 11 (ref 5–15)
BUN: UNDETERMINED mg/dL (ref 6–20)
CALCIUM: 6.8 mg/dL — AB (ref 8.9–10.3)
CO2: 23 mmol/L (ref 22–32)
Chloride: 101 mmol/L (ref 101–111)
Creatinine, Ser: 3.46 mg/dL — ABNORMAL HIGH (ref 0.61–1.24)
GFR calc Af Amer: 19 mL/min — ABNORMAL LOW (ref >60)
GFR, EST NON AFRICAN AMERICAN: 17 mL/min — AB (ref >60)
GLUCOSE: 342 mg/dL — AB (ref 65–99)
POTASSIUM: 4.9 mmol/L (ref 3.5–5.1)
SODIUM: 135 mmol/L (ref 135–145)

## 2016-07-31 LAB — GLUCOSE, CAPILLARY
GLUCOSE-CAPILLARY: 367 mg/dL — AB (ref 65–99)
GLUCOSE-CAPILLARY: 325 mg/dL — AB (ref 65–99)

## 2016-07-31 LAB — ABO/RH: ABO/RH(D): O POS

## 2016-07-31 LAB — PREPARE RBC (CROSSMATCH)

## 2016-07-31 MED ORDER — OXYCODONE-ACETAMINOPHEN 5-325 MG PO TABS
2.0000 | ORAL_TABLET | Freq: Four times a day (QID) | ORAL | Status: DC | PRN
  Administered 2016-07-31 – 2016-08-01 (×4): 2 via ORAL
  Filled 2016-07-31: qty 1
  Filled 2016-07-31 (×2): qty 2
  Filled 2016-07-31: qty 1
  Filled 2016-07-31: qty 2

## 2016-07-31 MED ORDER — SODIUM CHLORIDE 0.9 % IV SOLN: Freq: Once | INTRAVENOUS | Status: DC

## 2016-07-31 NOTE — Progress Notes (Signed)
PRE DIALYSIS ASSESSMENT 

## 2016-07-31 NOTE — Progress Notes (Signed)
Patient ID: Bruce [name], male   DOB: [birth year], 71 y.o.   MRN: [id number]  [employer] PROGRESS NOTE  Bruce [name]:[id number] DOB: 1945-10-10 DOA: 07/21/2016 PCP: [name], MD  HPI/Subjective: Patient breathing better, some cough but less. Schedule for hemodialysis today. Has chronic history of CML and sees [facility] oncology, prefers local hematologists. As per telemetry patient had 5 beats of nonsustained V. tach  Objective: Vitals:   07/31/16 1037 07/31/16 1555  BP: (!) 100/50   Pulse: (!) 57   Resp:  18  Temp:  97.7 F (36.5 C)    Filed Weights   07/30/16 0950 07/31/16 0300 07/31/16 1555  Weight: 78.1 kg (172 lb 2.9 oz) 79 kg (174 lb 3.2 oz) 80 kg (176 lb 5.9 oz)    ROS: Review of Systems  Constitutional: Negative for chills and fever.  Eyes: Negative for blurred vision.  Respiratory: Positive for cough and shortness of breath.   Cardiovascular: Negative for chest pain.  Gastrointestinal: Negative for abdominal pain, constipation, diarrhea, nausea and vomiting.  Genitourinary: Negative for dysuria.  Musculoskeletal: Negative for joint pain.  Neurological: Negative for dizziness and headaches.   Exam: Physical Exam  HENT:  Nose: No mucosal edema.  Mouth/Throat: No oropharyngeal exudate or posterior oropharyngeal edema.  Eyes: Conjunctivae, EOM and lids are normal. Pupils are equal, round, and reactive to light.  Neck: No JVD present. Carotid bruit is not present. No edema present. No thyroid mass and no thyromegaly present.  Cardiovascular: S1 normal and S2 normal.  Exam reveals no gallop.   No murmur heard. Pulses:      Dorsalis pedis pulses are 2+ on the right side, and 2+ on the left side.  Respiratory: No respiratory distress. He has decreased breath sounds in the right lower field. He has no wheezes. He has no rhonchi. He has no rales.  . Anterior chest wall with triple lumen catheter for hemodialysis  GI: Soft. Bowel sounds are normal. There  is no tenderness.  Musculoskeletal:       Right shoulder: He exhibits no swelling.  Lymphadenopathy:    He has no cervical adenopathy.  Neurological: He is alert. No cranial nerve deficit.  Skin: Skin is warm. No rash noted. Nails show no clubbing.  Psychiatric: He has a normal mood and affect.      Data Reviewed: Basic Metabolic Panel:  Recent Labs Lab 07/25/16 0603 07/25/16 2115 07/26/16 0657 07/28/16 0540 07/29/16 0347 07/30/16 0540 07/31/16 0559  NA 133*  --  133* 135 138 138 135  K 5.1  --  5.0 4.3 4.9 4.7 4.9  CL 98*  --  95* 96* 97* 98* 101  CO2 16*  --  19* 25 22 26 23   GLUCOSE 226*  --  348* 186* 106* 231* 342*  BUN 100*  --  120* 114* 132* 114* QUANTITY NOT SUFFICIENT, UNABLE TO PERFORM TEST  CREATININE 5.11*  --  5.87* 5.10* 5.20* 4.43* 3.46*  CALCIUM 6.4*  --  5.7* 5.5* 6.0* 6.2* 6.8*  MG  --  2.7*  --  2.6* 2.8*  --   --   PHOS 6.8*  --   --   --  5.5*  --   --    Liver Function Tests:  Recent Labs Lab 07/25/16 0603 07/29/16 0347  ALBUMIN 3.7 3.7   CBC:  Recent Labs Lab 07/27/16 0433 07/28/16 0540 07/31/16 0745 07/31/16 0848  WBC 23.1* 29.7* 70.7* 70.4*  HGB 10.3* 10.2* 7.8* 7.5*  HCT 33.0* 31.0* 26.2* 25.1*  MCV 93.2 90.9 94.0 92.1  PLT 687* 727* 508* 520*   BNP (last 3 results)  Recent Labs  04/04/16 1103 05/02/16 1115 07/21/16 0917  BNP 1,141.8* 2,219.0* 3,630.0*    CBG:  Recent Labs Lab 07/30/16 0758 07/30/16 1250 07/30/16 1651 07/30/16 2051 07/31/16 1146  GLUCAP 219* 212* 281* 322* 367*     Scheduled Meds: . amiodarone  400 mg Oral BID  . aspirin  81 mg Oral Daily  . atorvastatin  40 mg Oral Daily  . calcitRIOL  0.25 mcg Oral Daily  . calcium carbonate  500 mg of elemental calcium Oral BID  . heparin subcutaneous  5,000 Units Subcutaneous Q8H  . insulin aspart  0-5 Units Subcutaneous QHS  . insulin aspart  0-9 Units Subcutaneous TID WC  . metoprolol succinate  25 mg Oral Daily  . predniSONE  10 mg Oral Q  breakfast  . sodium chloride flush  3 mL Intravenous Q12H  . sodium chloride flush  3 mL Intravenous Q12H  . tiotropium  18 mcg Inhalation q morning - 10a   Continuous Infusions: . sodium chloride    . sodium chloride      Assessment/Plan:  1.  Acute kidney injury on chronic kidney disease with hyperkalemia and hyponatremia. Overdiuresis with cardiorenal syndrome as per nephrology. Scheduled for third dialysis today  2. Copd exacerbation- tapered solumedrol to 10mg  from 4/21 3. Acute on chronic combined Congestive heart failure, NYHA class III. Been holding diuretics most of the hospital stay secondary to acute kidney injury. This was likely not acute congestive heart failure. BNP can be falsely elevated with people on entresto and kidney failure. Follow up with Dr. Candis Musa 4.  Arrhythmia on amiodarone as per cardiology- [facility];  ?NSVT-According to telemetry, EKG reveals tachycardia. Cardiology is recommending to continue amiodarone 400 mg by mouth twice a day for 2 weeks then 200 mg once daily 5.  Essential hypertension. Blood pressure stable 6.  CML on aspirin,Consult local oncology 7.  Hyperlipidemia unspecified on atorvastatin 8.  History of TIA and stroke on aspirin 9.  Impaired fasting glucose secondary to steroids on sliding scale 10. Anemia of chronic kidney disease with chronic history of CML-we will provide one unit of blood transfusion during dialysis today and oncology consult is placed to local oncology group as per patient's request Code Status:     Code Status Orders        Start     Ordered   07/21/16 1207  Full code  Continuous     07/21/16 1206    Code Status History    Date Active Date Inactive Code Status Order ID Comments User Context   05/02/2016  6:59 PM 05/07/2016 10:52 PM Full Code [id number]  Idelle Crouch, MD Inpatient   03/21/2016  6:40 PM 03/23/2016  6:39 PM Full Code [id number]  Idelle Crouch, MD Inpatient   10/10/2015 11:59 PM 10/15/2015  7:08 PM Full  Code [id number]  Ivor Costa, MD ED   06/04/2015  1:54 AM 06/06/2015  5:28 PM Full Code [id number]  [name], MD ED   01/25/2015  3:16 AM 01/29/2015  5:17 PM Full Code [id number]  Lavina Hamman, MD ED   09/13/2014  5:07 PM 09/22/2014  6:09 PM Full Code [id number]  Flonnie Overman Dhungel, MD Inpatient   03/20/2014 11:33 PM 03/21/2014  5:46 PM Full Code [id number]  Arne Cleveland, MD Inpatient   03/18/2014 11:46 PM 03/20/2014 11:33 PM Full Code [id number]  [name], MD Inpatient   03/05/2014  9:04 PM 03/06/2014  6:57 PM Full Code [id number]  [name], MD Inpatient     Disposition Plan: To be determined  Consultants:  Nephrology  Cardiology  Procedures: - Dialysis catheter  Time spent: 24 minutes  Cherlyn Syring, [employer]

## 2016-07-31 NOTE — Progress Notes (Signed)
[facility]  ROUNDING NOTE   Subjective:   Patient is overall doing fair No acute shortness of breath  has had 2 treatments of dialysis so far and tolerated well Sitting up in the chair.  No acute complaints    Objective:  Vital signs in last 24 hours:  Temp:  [97.7 F (36.5 C)-97.8 F (36.6 C)] 97.7 F (36.5 C) (04/21 0453) Pulse Rate:  [57-63] 57 (04/21 1037) Resp:  [16-18] 18 (04/21 0453) BP: (100-108)/(50-58) 100/50 (04/21 1037) SpO2:  [98 %] 98 % (04/21 1037) Weight:  [79 kg (174 lb 3.2 oz)] 79 kg (174 lb 3.2 oz) (04/21 0300)  Weight change: -2.3 kg (-5 lb 1.1 oz) Filed Weights   07/29/16 2145 07/30/16 0950 07/31/16 0300  Weight: 78 kg (172 lb) 78.1 kg (172 lb 2.9 oz) 79 kg (174 lb 3.2 oz)    Intake/Output: I/O last 3 completed shifts: In: 110 [P.O.:110] Out: 2775 [Urine:1275; Other:1500]   Intake/Output this shift:  Total I/O In: 240 [P.O.:240] Out: -   Physical Exam: General: NAD  Head: Normocephalic, atraumatic. Moist oral mucosal membranes  Eyes: Muddy sclera   Neck: Supple, trachea midline  Lungs:  Clear to auscultation bilaterally   Heart: Regular rate and rhythm  Abdomen:  Soft, nontender,   Extremities: No peripheral edema.  Neurologic: Nonfocal, alert, oriented  Skin: No lesions   Rt IJ PC    Basic Metabolic Panel:  Recent Labs Lab 07/25/16 0603 07/25/16 2115 07/26/16 0657 07/28/16 0540 07/29/16 0347 07/30/16 0540 07/31/16 0559  NA 133*  --  133* 135 138 138 135  K 5.1  --  5.0 4.3 4.9 4.7 4.9  CL 98*  --  95* 96* 97* 98* 101  CO2 16*  --  19* 25 22 26 23   GLUCOSE 226*  --  348* 186* 106* 231* 342*  BUN 100*  --  120* 114* 132* 114* QUANTITY NOT SUFFICIENT, UNABLE TO PERFORM TEST  CREATININE 5.11*  --  5.87* 5.10* 5.20* 4.43* 3.46*  CALCIUM 6.4*  --  5.7* 5.5* 6.0* 6.2* 6.8*  MG  --  2.7*  --  2.6* 2.8*  --   --   PHOS 6.8*  --   --   --  5.5*  --   --     Liver Function Tests:  Recent Labs Lab 07/25/16 0603  07/29/16 0347  ALBUMIN 3.7 3.7   No results for input(s): LIPASE, AMYLASE in the last 168 hours. No results for input(s): AMMONIA in the last 168 hours.  CBC:  Recent Labs Lab 07/27/16 0433 07/28/16 0540 07/31/16 0745 07/31/16 0848  WBC 23.1* 29.7* 70.7* 70.4*  HGB 10.3* 10.2* 7.8* 7.5*  HCT 33.0* 31.0* 26.2* 25.1*  MCV 93.2 90.9 94.0 92.1  PLT 687* 727* 508* 520*    Cardiac Enzymes: No results for input(s): CKTOTAL, CKMB, CKMBINDEX, TROPONINI in the last 168 hours.  BNP: Invalid input(s): POCBNP  CBG:  Recent Labs Lab 07/30/16 0758 07/30/16 1250 07/30/16 1651 07/30/16 2051 07/31/16 1146  GLUCAP 219* 212* 281* 322* 42*    Microbiology: Results for orders placed or performed during the hospital encounter of 03/27/16  Culture, blood (Routine x 2)     Status: None   Collection Time: 03/27/16  5:35 PM  Result Value Ref Range Status   Specimen Description BLOOD LEFT ANTECUBITAL  Final   Special Requests BOTTLES DRAWN AEROBIC AND ANAEROBIC 5CC  Final   Culture NO GROWTH 5 DAYS  Final  Report Status 04/01/2016 FINAL  Final  Culture, blood (Routine x 2)     Status: None   Collection Time: 03/27/16  5:55 PM  Result Value Ref Range Status   Specimen Description BLOOD RIGHT ANTECUBITAL  Final   Special Requests BOTTLES DRAWN AEROBIC AND ANAEROBIC 5CC  Final   Culture NO GROWTH 5 DAYS  Final   Report Status 04/01/2016 FINAL  Final  Urine culture     Status: Abnormal   Collection Time: 03/27/16  6:05 PM  Result Value Ref Range Status   Specimen Description URINE, RANDOM  Final   Special Requests NONE  Final   Culture <10,000 COLONIES/mL INSIGNIFICANT GROWTH (A)  Final   Report Status 03/29/2016 FINAL  Final    Coagulation Studies: No results for input(s): LABPROT, INR in the last 72 hours.  Urinalysis: No results for input(s): COLORURINE, LABSPEC, PHURINE, GLUCOSEU, HGBUR, BILIRUBINUR, KETONESUR, PROTEINUR, UROBILINOGEN, NITRITE, LEUKOCYTESUR in the last 72  hours.  Invalid input(s): APPERANCEUR    Imaging: Dg Chest 1 View  Result Date: 07/29/2016 CLINICAL DATA:  Central line placement. EXAM: CHEST 1 VIEW COMPARISON:  07/23/2016. FINDINGS: Decreased inspiration. No significant change in enlargement of the cardiac silhouette with a globular configuration. Interval right jugular catheter with its tip in the upper right atrium. No pneumothorax. Clear lungs with normal vascularity. Thoracic spine degenerative changes. IMPRESSION: 1. Right jugular catheter tip in the upper right atrium. This could be retracted 3 cm to place it at the superior cavoatrial junction. 2. Cardiomegaly with a globular configuration, suggesting the possibility of a pericardial effusion. Electronically Signed   By: Claudie Revering M.D.   On: 07/29/2016 18:18     Medications:   . sodium chloride    . sodium chloride     . amiodarone  400 mg Oral BID  . aspirin  81 mg Oral Daily  . atorvastatin  40 mg Oral Daily  . calcitRIOL  0.25 mcg Oral Daily  . calcium carbonate  500 mg of elemental calcium Oral BID  . heparin subcutaneous  5,000 Units Subcutaneous Q8H  . insulin aspart  0-5 Units Subcutaneous QHS  . insulin aspart  0-9 Units Subcutaneous TID WC  . metoprolol succinate  25 mg Oral Daily  . predniSONE  10 mg Oral Q breakfast  . sodium chloride flush  3 mL Intravenous Q12H  . sodium chloride flush  3 mL Intravenous Q12H  . tiotropium  18 mcg Inhalation q morning - 10a   sodium chloride, acetaminophen **OR** acetaminophen, albuterol, bisacodyl, diphenhydrAMINE, guaiFENesin-codeine, ipratropium-albuterol, meclizine, ondansetron **OR** ondansetron (ZOFRAN) IV, oxyCODONE-acetaminophen, senna-docusate, sodium chloride flush  Assessment/ Plan:  Bruce [name] is a 71 y.o. black male with alcoholism, systolic congestive heart failure, CVA, hypertension, coronary artery disease, hyperlipidemia, who was admitted to [facility] on 07/21/2016  1. Acute renal failure  Acute  renal failure secondary to acute cardiorenal syndrome  Baseline creatinine 1.32.  Jul 21, 2016 Serum creatinine  peaked at 5.87.  BUN/Cr remain critically high  Permcath placed 4/19  Third dialysis treatment today then TTS According to clinical social worker notes, patient to be discharged to [facility] care once stable Out patient discharge planning for acute kidney injury dialysis   2. Acute exacerbation of chronic systolic congestive heart failure: echo 12/17 EF 15-20%., pulmonary Hypertension holding diuretics. No signs of volume overload - Monitor volume status.   3. COPD acute exacerbation:  - steroids and supportive care.   4. Acidosis - d/c bicarb drip -  monitor - expected to improve with HD  5. Hypocalcemia -  vitamin D - calcium supplements 500 twice a day - improving      LOS: 10 Bruce [name] 4/21/20181:10 PM

## 2016-07-31 NOTE — Plan of Care (Signed)
Problem: Safety: Goal: Ability to remain free from injury will improve Pts exit alarm is activated. Pt is ecouraged to call out for assistance with activity.

## 2016-07-31 NOTE — Progress Notes (Signed)
MD notified via text. WBCs are reported critical at 70.7 I will await any new orders and discuss in person with MD when rounding. I will continue to assess.

## 2016-07-31 NOTE — Progress Notes (Signed)
Reviewed PTA note and agree with recommendation of SNF as pt is not at baseline level. This is a change from previous recommendation. Greggory Stallion, PT, DPT [phone]

## 2016-07-31 NOTE — Progress Notes (Signed)
HD STARTED  

## 2016-07-31 NOTE — Progress Notes (Signed)
Progress Note  Patient Name: Bruce [name] Date of Encounter: 07/31/2016  Primary Cardiologist: Dr. Terrence Dupont (Followed by Dr. Rockey Situ this admission)  Subjective   Breathing is at baseline. Feeling well not having any issues with dialysis at this time.  Inpatient Medications    Scheduled Meds: . amiodarone  400 mg Oral BID  . aspirin  81 mg Oral Daily  . atorvastatin  40 mg Oral Daily  . calcitRIOL  0.25 mcg Oral Daily  . calcium carbonate  500 mg of elemental calcium Oral BID  . heparin subcutaneous  5,000 Units Subcutaneous Q8H  . insulin aspart  0-5 Units Subcutaneous QHS  . insulin aspart  0-9 Units Subcutaneous TID WC  . metoprolol succinate  25 mg Oral Daily  . predniSONE  10 mg Oral Q breakfast  . sodium chloride flush  3 mL Intravenous Q12H  . sodium chloride flush  3 mL Intravenous Q12H  . tiotropium  18 mcg Inhalation q morning - 10a   Continuous Infusions: . sodium chloride    . sodium chloride     PRN Meds: sodium chloride, acetaminophen **OR** acetaminophen, albuterol, bisacodyl, diphenhydrAMINE, guaiFENesin-codeine, ipratropium-albuterol, meclizine, ondansetron **OR** ondansetron (ZOFRAN) IV, oxyCODONE-acetaminophen, senna-docusate, sodium chloride flush   Vital Signs    Vitals:   07/30/16 2052 07/31/16 0300 07/31/16 0453 07/31/16 1037  BP: (!) 104/58  (!) 108/54 (!) 100/50  Pulse: 63  60 (!) 57  Resp: 16  18   Temp: 97.8 F (36.6 C)  97.7 F (36.5 C)   TempSrc:      SpO2: 98%   98%  Weight:  174 lb 3.2 oz (79 kg)    Height:        Intake/Output Summary (Last 24 hours) at 07/31/16 1506 Last data filed at 07/31/16 1420  Gross per 24 hour  Intake              480 ml  Output              975 ml  Net             -495 ml   Filed Weights   07/29/16 2145 07/30/16 0950 07/31/16 0300  Weight: 172 lb (78 kg) 172 lb 2.9 oz (78.1 kg) 174 lb 3.2 oz (79 kg)    Telemetry    Sinus rhythm rate 50s to 60s  - Personally Reviewed  ECG    No new  tracings.   Physical Exam   General: Well developed, well nourished African American male appearing in no acute distress. Head: Normocephalic, atraumatic.  Neck: Supple without bruits, JVD not elevated. Catheter in place.  Lungs:  Resp regular and unlabored, CTA without wheezing or rales. Heart: RRR, S1, S2, no S3, S4, or murmur; no rub. Abdomen: Soft, non-tender, non-distended with normoactive bowel sounds. No hepatomegaly. No rebound/guarding. No obvious abdominal masses. Extremities: No clubbing, cyanosis, or lower extremity edema. Distal pedal pulses are 2+ bilaterally. Neuro: Alert and oriented X 3. Moves all extremities spontaneously. Psych: Normal affect.  Labs    Chemistry Recent Labs Lab 07/25/16 0603  07/29/16 0347 07/30/16 0540 07/31/16 0559  NA 133*  < > 138 138 135  K 5.1  < > 4.9 4.7 4.9  CL 98*  < > 97* 98* 101  CO2 16*  < > 22 26 23   GLUCOSE 226*  < > 106* 231* 342*  BUN 100*  < > 132* 114* QUANTITY NOT SUFFICIENT, UNABLE TO PERFORM TEST  CREATININE 5.11*  < >  5.20* 4.43* 3.46*  CALCIUM 6.4*  < > 6.0* 6.2* 6.8*  ALBUMIN 3.7  --  3.7  --   --   GFRNONAA 10*  < > 10* 12* 17*  GFRAA 12*  < > 12* 14* 19*  ANIONGAP 19*  < > 19* 14 11  < > = values in this interval not displayed.   Hematology  Recent Labs Lab 07/28/16 0540 07/31/16 0745 07/31/16 0848  WBC 29.7* 70.7* 70.4*  RBC 3.41* 2.78* 2.72*  HGB 10.2* 7.8* 7.5*  HCT 31.0* 26.2* 25.1*  MCV 90.9 94.0 92.1  MCH 29.9 28.0 27.7  MCHC 32.9 29.8* 30.1*  RDW 20.5* 20.2* 20.4*  PLT 727* 508* 520*    Cardiac EnzymesNo results for input(s): TROPONINI in the last 168 hours. No results for input(s): TROPIPOC in the last 168 hours.   BNPNo results for input(s): BNP, PROBNP in the last 168 hours.   DDimer No results for input(s): DDIMER in the last 168 hours.   Radiology    Dg Chest 1 View  Result Date: 07/29/2016 CLINICAL DATA:  Central line placement. EXAM: CHEST 1 VIEW COMPARISON:  07/23/2016.  FINDINGS: Decreased inspiration. No significant change in enlargement of the cardiac silhouette with a globular configuration. Interval right jugular catheter with its tip in the upper right atrium. No pneumothorax. Clear lungs with normal vascularity. Thoracic spine degenerative changes. IMPRESSION: 1. Right jugular catheter tip in the upper right atrium. This could be retracted 3 cm to place it at the superior cavoatrial junction. 2. Cardiomegaly with a globular configuration, suggesting the possibility of a pericardial effusion. Electronically Signed   By: Claudie Revering M.D.   On: 07/29/2016 18:18    Cardiac Studies   Echocardiogram: 03/22/2016 Study Conclusions  - Left ventricle: The cavity size was mildly dilated. Wall   thickness was normal. Systolic function was severely reduced. The   estimated ejection fraction was in the range of 15% to 20%.   Diffuse hypokinesis. The study is not technically sufficient to   allow evaluation of LV diastolic function. - Mitral valve: There was mild regurgitation. - Left atrium: The atrium was mildly to moderately dilated. - Right ventricle: The cavity size was mildly dilated. Wall   thickness was normal. Systolic function was moderately reduced. - Right atrium: The atrium was moderately dilated. - Tricuspid valve: Mildly thickened leaflets. There was   moderate-severe regurgitation. - Pulmonic valve: There was moderate regurgitation. - Pulmonary arteries: Systolic pressure was moderately increased.   PA peak pressure: 50 mm Hg (S).   Patient Profile     71 y.o. male w/ PMH of chronic combined systolic and diastolic CHF, NICM (nonobstructive CAD by cath in 2016), atrial tachycardia, prior CVA, CML, prior substance use and medical noncompliance who presented [facility] on 07/[facility] for progressive dyspnea on exertion.   Assessment & Plan    1. Acute on chronic combined systolic and diastolic CHF - has a known severely reduced EF of 15-20% by his  last echo with Grade 3 DD.  - admitted for worsening dyspnea on exertion but developed acute renal failure following initiation of diuresis. Creatinine peaked at 5.87 and he has been started on HD by Nephrology.  - Does not yet have a Cooxreturned for possible initiation of milrinone. We'll potentially start milrinone pending that lab result. - continue BB. He may benefit from nitrates and hydralazine in the future pending blood pressure. Volume management per dialysis.  2. Elevated Troponin - values peaked at 0.10 this  admission. Likely secondary to demand ischemia. With nonobstructive coronary disease. No further ischemic workup at this time.  3. Nonischemic Cardiomyopathy  - cath in 2016 with nonobstructive CAD. Echo in 03/2016 showed an EF of 15-20%, recent imaging at [facility] in 05/2016 with similar results.  - continue Toprol-XL. No ACE-I/ARB/ARNI secondary to AKI. Consider addition of Hydralazine and Nitrates if BP allows.   4. Atrial Tachycardia - evaluated by Dr. Caryl Comes on 07/29/2016 and started on Amiodarone at that time. Continue amiodarone 400 mg twice daily for 2 weeks then 200 mg daily on 08/12/16. Maintaining sinus rhythm.  5. CML - followed by Hematology at [facility].  6. Acute on Chronic Stage 3 CKD - creatinine at 1.32 on admission, peaked at 5.87 on 07/26/2016. - underwent PermCath placement on 4/19. Has been started on HD.  - per Nephrology.   7. COPD Exacerbation - per admitting team  8. Homeless/ History of Polysubstance abuse: - prior cocaine use. Denies any recent use and UDS negative this admission. Cessation advised.    Signed, Annastacia Duba Meredith Leeds , M.D. 3:06 PM 07/31/2016 Pager: [phone]

## 2016-07-31 NOTE — Progress Notes (Signed)
Pt returns from HD, report called to RN from [place] the HD nurse, VSS, complaints of generalized pain treated with prn medication. I will continue to assess.

## 2016-07-31 NOTE — Progress Notes (Signed)
Physical Therapy Treatment Patient Details Name: Bruce [name] MRN: [id number] DOB: 05/02/1945 Today's Date: 07/31/2016    History of Present Illness 71 y.o. male with a known history of CAD, CHF, CK D, CML and cocaine abuse. The patient presently ED with worsening shortness of breath and productive cough for several months. He was admitted to [facility] in March and was thought to have a coronary vasospasm from cocaine use, CHF exacerbation and non-STEMI.    PT Comments    Pt in bed, ready for session, anxious to walk today.  To edge of bed with min a x 1.  Once sitting, he was able to stand with min guard to walker to use urinal.  After seated rest he was able to ambulate 70' x 2 with walker and min assist.  He required a seated rest break in chair due to LE fatigue.   Pt needed RW for gait today and relied heavily on it.  Was unable to safety use [facility] as in previous sessions.  Poor hand placements when sitting and approaching chairs.   Pt with overall decreased safety and tolerance for activity from the last time he was able to participate in session.  Pt requires +1 assist for mobility at this time and is at an increased risk for fall.  Initially recommended HHPT but after discussion with on site PT, recommendation changed to SNF to increase strength and mobility.   Follow Up Recommendations  SNF     Equipment Recommendations  Rolling walker with 5" wheels    Recommendations for Other Services       Precautions / Restrictions Precautions Precautions: Fall Restrictions Weight Bearing Restrictions: No    Mobility  Bed Mobility Overal bed mobility: Needs Assistance Bed Mobility: Supine to Sit     Supine to sit: Min assist     General bed mobility comments: light assist and use of rails/HOB raised  Transfers Overall transfer level: Needs assistance Equipment used: Rolling walker (2 wheeled) Transfers: Sit to/from Stand               Ambulation/Gait Ambulation/Gait assistance: Min assist Ambulation Distance (Feet): 70 Feet Assistive device: Rolling walker (2 wheeled) Gait Pattern/deviations: Step-through pattern   Gait velocity interpretation: Below normal speed for age/gender     Stairs            Wheelchair Mobility    Modified Rankin (Stroke Patients Only)       Balance Overall balance assessment: Modified Independent                                          Cognition Arousal/Alertness: Awake/alert Behavior During Therapy: WFL for tasks assessed/performed Overall Cognitive Status: Within Functional Limits for tasks assessed                                        Exercises      General Comments        Pertinent Vitals/Pain Pain Assessment: No/denies pain    Home Living                      Prior Function            PT Goals (current goals can now be found in the care plan section) Progress  towards PT goals: Progressing toward goals    Frequency    Min 2X/week      PT Plan Discharge plan needs to be updated    Co-evaluation             End of Session Equipment Utilized During Treatment: Gait belt Activity Tolerance: Patient limited by fatigue Patient left: in chair;with chair alarm set;with call bell/phone within reach         Time: 3338-3291 PT Time Calculation (min) (ACUTE ONLY): 18 min  Charges:  $Gait Training: 8-22 mins                    G Codes:       Chesley Noon, PTA 07/31/16, 12:51 PM

## 2016-07-31 NOTE — Progress Notes (Signed)
MD notified Pt is not being relieved with prn medication dose, orders to increase dose and MD entered orders to redraw CBC, WBC elevated and hemoglobin is 7.5, MD instructs RN to call Nephrology. Nephrology will speak with MD. I will await any new orders. I will continue to assess.

## 2016-07-31 NOTE — Progress Notes (Signed)
HD COMPLETED  

## 2016-07-31 NOTE — Progress Notes (Signed)
POST DIALYSIS ASSESSMENT 

## 2016-08-01 DIAGNOSIS — Z87891 Personal history of nicotine dependence: Secondary | ICD-10-CM

## 2016-08-01 DIAGNOSIS — N182 Chronic kidney disease, stage 2 (mild): Secondary | ICD-10-CM

## 2016-08-01 DIAGNOSIS — E7800 Pure hypercholesterolemia, unspecified: Secondary | ICD-10-CM

## 2016-08-01 DIAGNOSIS — Z79899 Other long term (current) drug therapy: Secondary | ICD-10-CM

## 2016-08-01 DIAGNOSIS — I2510 Atherosclerotic heart disease of native coronary artery without angina pectoris: Secondary | ICD-10-CM

## 2016-08-01 DIAGNOSIS — Z8673 Personal history of transient ischemic attack (TIA), and cerebral infarction without residual deficits: Secondary | ICD-10-CM

## 2016-08-01 DIAGNOSIS — D649 Anemia, unspecified: Secondary | ICD-10-CM

## 2016-08-01 DIAGNOSIS — Z856 Personal history of leukemia: Secondary | ICD-10-CM

## 2016-08-01 DIAGNOSIS — N179 Acute kidney failure, unspecified: Secondary | ICD-10-CM

## 2016-08-01 LAB — CBC
HCT: 24.6 % — ABNORMAL LOW (ref 40.0–52.0)
Hemoglobin: 7.4 g/dL — ABNORMAL LOW (ref 13.0–18.0)
MCH: 26.8 pg (ref 26.0–34.0)
MCHC: 29.9 g/dL — AB (ref 32.0–36.0)
MCV: 89.6 fL (ref 80.0–100.0)
Platelets: 373 10*3/uL (ref 150–440)
RBC: 2.75 MIL/uL — ABNORMAL LOW (ref 4.40–5.90)
RDW: 19.5 % — AB (ref 11.5–14.5)
WBC: 77.4 10*3/uL (ref 3.8–10.6)

## 2016-08-01 LAB — GLUCOSE, CAPILLARY
GLUCOSE-CAPILLARY: 272 mg/dL — AB (ref 65–99)
Glucose-Capillary: 306 mg/dL — ABNORMAL HIGH (ref 65–99)
Glucose-Capillary: 380 mg/dL — ABNORMAL HIGH (ref 65–99)
Glucose-Capillary: 271 mg/dL — ABNORMAL HIGH (ref 65–99)

## 2016-08-01 LAB — RENAL FUNCTION PANEL
ALBUMIN: 2.5 g/dL — AB (ref 3.5–5.0)
ANION GAP: 8 (ref 5–15)
BUN: 78 mg/dL — ABNORMAL HIGH (ref 6–20)
CALCIUM: 7.1 mg/dL — AB (ref 8.9–10.3)
CO2: 28 mmol/L (ref 22–32)
Chloride: 100 mmol/L — ABNORMAL LOW (ref 101–111)
Creatinine, Ser: 2.76 mg/dL — ABNORMAL HIGH (ref 0.61–1.24)
GFR calc non Af Amer: 22 mL/min — ABNORMAL LOW (ref >60)
GFR, EST AFRICAN AMERICAN: 25 mL/min — AB (ref >60)
Glucose, Bld: 310 mg/dL — ABNORMAL HIGH (ref 65–99)
Phosphorus: 2.4 mg/dL — ABNORMAL LOW (ref 2.5–4.6)
Potassium: 4.2 mmol/L (ref 3.5–5.1)
SODIUM: 136 mmol/L (ref 135–145)

## 2016-08-01 NOTE — Consult Note (Signed)
[place]  [phone] [phone]  ID: JAMILE SIVILS OB: Sep 27, 1945  MR#: [id number]  QZE#:[id number]  Patient Care Team: [name], MD as PCP - General (Cardiology) Truitt Merle, MD as Consulting Physician (Hematology)  CHIEF COMPLAINT: History of CML, worsening shortness of breath.  INTERVAL HISTORY: Patient is a 71 year old male who was recently admitted the hospital with worsening shortness of breath. He was also noted to have acute renal failure and is now undergoing dialysis. He has a history of CML that was previously managed at [facility], but has not been seen in greater than 1 year. Patient was last evaluated as an inpatient consult on May 04, 2016. Since that time, he has skipped 3 or 4 outpatient appointments. He has no new neurologic complaints. He denies any recent fevers. He denies any chest pain, cough, or hemoptysis. He has a good appetite and denies weight loss. He denies any night sweats. He has no nausea, vomiting, constipation, or diarrhea. Patient offers no further specific complaints today.  REVI EW OF SYSTEMS:   Review of Systems  Constitutional: Negative.  Negative for chills, diaphoresis, fever, malaise/fatigue and weight loss.  Respiratory: Positive for shortness of breath. Negative for cough and hemoptysis.   Cardiovascular: Negative.  Negative for chest pain and leg swelling.  Gastrointestinal: Negative.  Negative for abdominal pain.  Genitourinary: Negative.   Musculoskeletal: Negative.   Skin: Negative.  Negative for rash.  Neurological: Negative.  Negative for weakness.  Psychiatric/Behavioral: Positive for memory loss.    As per HPI. Otherwise, a complete review of systems is negative.  PAST MEDICAL HISTORY: Past Medical History:  Diagnosis Date  . Bell's palsy   . Chronic combined systolic and diastolic CHF, NYHA class 3 ([place])   . CKD (chronic kidney disease), stage II   . CML (chronic myelocytic leukemia)  ([place])   . Coronary artery disease, non-occlusive   . Hypercholesterolemia   . Hypertension   . Leukemia ([place])   . Stroke ([place])    No residual limb weakness.  Walks with cane at baseline.   Marland Kitchen TIA (transient ischemic attack) 05/10/2014    PAST SURGICAL HISTORY: Past Surgical History:  Procedure Laterality Date  . BACK SURGERY    . DIALYSIS/PERMA CATHETER INSERTION N/A 07/29/2016   Procedure: Dialysis/Perma Catheter Insertion;  Surgeon: Algernon Huxley, MD;  Location: [facility];  Service: Cardiovascular;  Laterality: N/A;  . HIP ARTHROPLASTY Right    orif  . ORIF FOREARM FRACTURE Right     FAMILY HISTORY: Family History  Problem Relation Age of Onset  . Diabetes Mother   . Hypertension Mother   . Diabetes Father   . Hypertension Father   . Diabetes Brother   . Hypertension Brother   . Diabetes Sister   . Hypertension Sister   . Diabetes Brother   . Hypertension Brother   . Diabetes Sister   . Hypertension Sister     ADVANCED DIRECTIVES (Y/N):  @ADVDIR @  HEALTH MAINTENANCE: Social History  Substance Use Topics  . Smoking status: Former Smoker    Packs/day: 0.50    Years: 50.00    Types: Cigarettes  . Smokeless tobacco: Never Used  . Alcohol use No     Colonoscopy:  PAP:  Bone density:  Lipid panel:  No Known Allergies  Current Facility-Administered Medications  Medication Dose Route Frequency Provider Last Rate Last Dose  . 0.9 %  sodium chloride infusion  250 mL Intravenous PRN Demetrios Loll, MD      .  0.9 %  sodium chloride infusion   Intravenous Once Nicholes Mango, MD      . acetaminophen (TYLENOL) tablet 650 mg  650 mg Oral Q6H PRN Demetrios Loll, MD       Or  . acetaminophen (TYLENOL) suppository 650 mg  650 mg Rectal Q6H PRN Demetrios Loll, MD      . albuterol (PROVENTIL) (2.5 MG/3ML) 0.083% nebulizer solution 2.5 mg  2.5 mg Nebulization Q2H PRN Demetrios Loll, MD   2.5 mg at 07/23/16 0507  . amiodarone (PACERONE) tablet 400 mg  400 mg Oral BID Deboraha Sprang, MD    400 mg at 07/31/16 2153  . aspirin chewable tablet 81 mg  81 mg Oral Daily Demetrios Loll, MD   81 mg at 08/01/16 1103  . atorvastatin (LIPITOR) tablet 40 mg  40 mg Oral Daily Demetrios Loll, MD   40 mg at 08/01/16 1103  . bisacodyl (DULCOLAX) EC tablet 5 mg  5 mg Oral Daily PRN Demetrios Loll, MD      . calcitRIOL (ROCALTROL) capsule 0.25 mcg  0.25 mcg Oral Daily Harmeet Singh, MD   0.25 mcg at 08/01/16 1102  . calcium carbonate (TUMS - dosed in mg elemental calcium) chewable tablet 500 mg of elemental calcium  500 mg of elemental calcium Oral BID Lenis Noon, RPH   500 mg of elemental calcium at 08/01/16 1102  . diphenhydrAMINE (BENADRYL) capsule 25 mg  25 mg Oral Q6H PRN Demetrios Loll, MD   25 mg at 07/26/16 2239  . guaiFENesin-codeine 100-10 MG/5ML solution 10 mL  10 mL Oral Q6H PRN Dustin Flock, MD   10 mL at 07/27/16 0834  . heparin injection 5,000 Units  5,000 Units Subcutaneous Q8H Loletha Grayer, MD   5,000 Units at 08/01/16 0516  . insulin aspart (novoLOG) injection 0-5 Units  0-5 Units Subcutaneous QHS Demetrios Loll, MD   4 Units at 07/31/16 2153  . insulin aspart (novoLOG) injection 0-9 Units  0-9 Units Subcutaneous TID WC Demetrios Loll, MD   9 Units at 08/01/16 1253  . ipratropium-albuterol (DUONEB) 0.5-2.5 (3) MG/3ML nebulizer solution 3 mL  3 mL Nebulization Q6H PRN Loletha Grayer, MD   3 mL at 07/28/16 0835  . meclizine (ANTIVERT) tablet 25 mg  25 mg Oral TID PRN Demetrios Loll, MD      . metoprolol succinate (TOPROL-XL) 24 hr tablet 25 mg  25 mg Oral Daily Demetrios Loll, MD   25 mg at 07/31/16 1043  . ondansetron (ZOFRAN) tablet 4 mg  4 mg Oral Q6H PRN Demetrios Loll, MD   4 mg at 07/22/16 0830   Or  . ondansetron (ZOFRAN) injection 4 mg  4 mg Intravenous Q6H PRN Demetrios Loll, MD   4 mg at 07/24/16 1934  . oxyCODONE-acetaminophen (PERCOCET/ROXICET) 5-325 MG per tablet 2 tablet  2 tablet Oral Q6H PRN Nicholes Mango, MD   2 tablet at 08/01/16 1103  . predniSONE (DELTASONE) tablet 10 mg  10 mg Oral Q breakfast Loletha Grayer, MD   10 mg at 08/01/16 0846  . senna-docusate (Senokot-S) tablet 1 tablet  1 tablet Oral QHS PRN Demetrios Loll, MD      . sodium chloride flush (NS) 0.9 % injection 3 mL  3 mL Intravenous Q12H Demetrios Loll, MD   3 mL at 07/31/16 2155  . sodium chloride flush (NS) 0.9 % injection 3 mL  3 mL Intravenous Q12H Demetrios Loll, MD   3 mL at 08/01/16 1103  . sodium chloride  flush (NS) 0.9 % injection 3 mL  3 mL Intravenous PRN Demetrios Loll, MD      . tiotropium [facility]) inhalation capsule 18 mcg  18 mcg Inhalation q morning - 10a Demetrios Loll, MD   18 mcg at 08/01/16 1103    OBJECTIVE: Vitals:   08/01/16 0431 08/01/16 1052  BP: (!) 103/56 (!) 95/53  Pulse: (!) 59 (!) 56  Resp: 19   Temp: 97.9 F (36.6 C) 98 F (36.7 C)     Body mass index is 25.84 kg/m.    ECOG FS:1 - Symptomatic but completely ambulatory  General: Well-developed, well-nourished, no acute distress. Eyes: Pink conjunctiva, anicteric sclera. HEENT: Normocephalic, moist mucous membranes, clear oropharnyx. Lungs: Clear to auscultation bilaterally. Heart: Regular rate and rhythm. No rubs, murmurs, or gallops. Abdomen: Soft, nontender, nondistended. No organomegaly noted, normoactive bowel sounds. Musculoskeletal: No edema, cyanosis, or clubbing. Neuro: Alert, answering all questions appropriately. Cranial nerves grossly intact. Skin: No rashes or petechiae noted. Psych: Normal affect. Lymphatics: No cervical, calvicular, axillary or inguinal LAD.   LAB RESULTS:  Lab Results  Component Value Date   NA 136 08/01/2016   K 4.2 08/01/2016   CL 100 (L) 08/01/2016   CO2 28 08/01/2016   GLUCOSE 310 (H) 08/01/2016   BUN 78 (H) 08/01/2016   CREATININE 2.76 (H) 08/01/2016   CALCIUM 7.1 (L) 08/01/2016   PROT 7.7 07/21/2016   ALBUMIN 2.5 (L) 08/01/2016   AST 34 07/21/2016   ALT 20 07/21/2016   ALKPHOS 207 (H) 07/21/2016   BILITOT 2.6 (H) 07/21/2016   GFRNONAA 22 (L) 08/01/2016   GFRAA 25 (L) 08/01/2016    Lab Results    Component Value Date   WBC 77.4 (HH) 08/01/2016   NEUTROABS 17.6 (H) 07/21/2016   HGB 7.4 (L) 08/01/2016   HCT 24.6 (L) 08/01/2016   MCV 89.6 08/01/2016   PLT 373 08/01/2016     STUDIES: Dg Chest 1 View  Result Date: 07/29/2016 CLINICAL DATA:  Central line placement. EXAM: CHEST 1 VIEW COMPARISON:  07/23/2016. FINDINGS: Decreased inspiration. No significant change in enlargement of the cardiac silhouette with a globular configuration. Interval right jugular catheter with its tip in the upper right atrium. No pneumothorax. Clear lungs with normal vascularity. Thoracic spine degenerative changes. IMPRESSION: 1. Right jugular catheter tip in the upper right atrium. This could be retracted 3 cm to place it at the superior cavoatrial junction. 2. Cardiomegaly with a globular configuration, suggesting the possibility of a pericardial effusion. Electronically Signed   By: Claudie Revering M.D.   On: 07/29/2016 18:18   Dg Chest 2 View  Result Date: 07/23/2016 CLINICAL DATA:  CHF, chronic cough. EXAM: CHEST  2 VIEW COMPARISON:  07/21/2016 FINDINGS: Cardiomegaly. No confluent opacities, effusions or edema. No acute bony abnormality. IMPRESSION: Cardiomegaly.  No active disease. Electronically Signed   By: Rolm Baptise M.D.   On: 07/23/2016 10:45   Dg Chest 2 View  Result Date: 07/21/2016 CLINICAL DATA:  Two months of generalize weakness, lightheadedness, shortness of breath, and intermittent chest pain. Chronic cough. History of leukemia, former smoker. EXAM: CHEST  2 VIEW COMPARISON:  Chest x-ray of May 02, 2016 FINDINGS: The lungs are well-expanded. There is no focal infiltrate. There is no pleural effusion. The cardiac silhouette is mildly enlarged but stable. The central pulmonary vascularity is prominent but there is no definite cephalization. There is calcification in the wall of the aortic arch. The mediastinum is normal in width. There is multilevel degenerative disc  disease of the thoracic  spine. IMPRESSION: Stable cardiomegaly without pulmonary edema. No pneumonia nor other acute cardiopulmonary abnormality. Thoracic aortic atherosclerosis. Electronically Signed   By: David  Martinique M.D.   On: 07/21/2016 09:55    ASSESSMENT: History of CML, shortness of breath, acute renal failure.   PLAN:     1. CML: Patient previously was treated at [facility], but has not been evaluated or under treatment for over one year. Previously he was taking Bosulif 300 mg daily, but is unclear if his compliance. Given his significantly elevated white blood cell count, he clearly has not taken treatment in some time. Patient once again has expressed interest in transferring his care to [facility], but since his last hospital admission in January he has missed 3-4 outpatient appointments. Given his long history of noncompliance as well as his multiple medical problems, treatment would be difficult as it is.  Please ensure patient has follow-up 1-2 weeks after discharge. If he is once again no shows for this appointment, will proceed with referring all of his care back to [facility]. 2. Renal failure: Patient recently received dialysis. Appreciate nephrology input. 3. Anemia: Multifactorial. Patient received one unit of packed red blood cells yesterday with minimal improvement of his hemoglobin.   Appreciate consult, call with questions.  Lloyd Huger, MD   08/01/2016 2:55 PM

## 2016-08-01 NOTE — Progress Notes (Signed)
Pt. Constipated, hard stool visible at rectum, pt. Unable to pass the stool, Dr. Jannifer Franklin paged, new order for Soap suds enema

## 2016-08-01 NOTE — Progress Notes (Signed)
[facility]  ROUNDING NOTE   Subjective:   Patient is overall doing fair No acute shortness of breath  has had 3 treatments of dialysis so far and tolerated well No acute complaints    Objective:  Vital signs in last 24 hours:  Temp:  [97.5 F (36.4 C)-98 F (36.7 C)] 98 F (36.7 C) (04/22 1052) Pulse Rate:  [56-60] 56 (04/22 1052) Resp:  [18-20] 19 (04/22 0431) BP: (90-107)/(46-56) 95/53 (04/22 1052) SpO2:  [94 %-100 %] 100 % (04/22 1052) Weight:  [77.1 kg (169 lb 15.6 oz)-80 kg (176 lb 5.9 oz)] 77.1 kg (169 lb 15.6 oz) (04/22 0500)  Weight change: 1.9 kg (4 lb 3 oz) Filed Weights   07/31/16 1555 07/31/16 1854 08/01/16 0500  Weight: 80 kg (176 lb 5.9 oz) 78.7 kg (173 lb 8 oz) 77.1 kg (169 lb 15.6 oz)    Intake/Output: I/O last 3 completed shifts: In: 517.5 [P.O.:480; Blood:37.5] Out: 1825 [Urine:1325; Other:500]   Intake/Output this shift:  Total I/O In: 240 [P.O.:240] Out: -   Physical Exam: General: NAD  Head: Normocephalic, atraumatic. Moist oral mucosal membranes  Eyes: Muddy sclera   Neck: Supple, trachea midline  Lungs:  Clear to auscultation bilaterally   Heart: Regular rate and rhythm  Abdomen:  Soft, nontender,   Extremities: No peripheral edema.  Neurologic: Nonfocal, alert, oriented  Skin: No lesions   Rt IJ PC    Basic Metabolic Panel:  Recent Labs Lab 07/25/16 2115  07/28/16 0540 07/29/16 0347 07/30/16 0540 07/31/16 0559 08/01/16 0641  NA  --   < > 135 138 138 135 136  K  --   < > 4.3 4.9 4.7 4.9 4.2  CL  --   < > 96* 97* 98* 101 100*  CO2  --   < > 25 22 26 23 28   GLUCOSE  --   < > 186* 106* 231* 342* 310*  BUN  --   < > 114* 132* 114* QUANTITY NOT SUFFICIENT, UNABLE TO PERFORM TEST 78*  CREATININE  --   < > 5.10* 5.20* 4.43* 3.46* 2.76*  CALCIUM  --   < > 5.5* 6.0* 6.2* 6.8* 7.1*  MG 2.7*  --  2.6* 2.8*  --   --   --   PHOS  --   --   --  5.5*  --   --  2.4*  < > = values in this interval not displayed.  Liver  Function Tests:  Recent Labs Lab 07/29/16 0347 08/01/16 0641  ALBUMIN 3.7 2.5*   No results for input(s): LIPASE, AMYLASE in the last 168 hours. No results for input(s): AMMONIA in the last 168 hours.  CBC:  Recent Labs Lab 07/27/16 0433 07/28/16 0540 07/31/16 0745 07/31/16 0848 08/01/16 0641  WBC 23.1* 29.7* 70.7* 70.4* 77.4*  HGB 10.3* 10.2* 7.8* 7.5* 7.4*  HCT 33.0* 31.0* 26.2* 25.1* 24.6*  MCV 93.2 90.9 94.0 92.1 89.6  PLT 687* 727* 508* 520* 373    Cardiac Enzymes: No results for input(s): CKTOTAL, CKMB, CKMBINDEX, TROPONINI in the last 168 hours.  BNP: Invalid input(s): POCBNP  CBG:  Recent Labs Lab 07/30/16 2051 07/31/16 1146 07/31/16 2117 08/01/16 0817 08/01/16 1155  GLUCAP 322* 367* 325* 306* 1*    Microbiology: Results for orders placed or performed during the hospital encounter of 03/27/16  Culture, blood (Routine x 2)     Status: None   Collection Time: 03/27/16  5:35 PM  Result Value Ref Range  Status   Specimen Description BLOOD LEFT ANTECUBITAL  Final   Special Requests BOTTLES DRAWN AEROBIC AND ANAEROBIC 5CC  Final   Culture NO GROWTH 5 DAYS  Final   Report Status 04/01/2016 FINAL  Final  Culture, blood (Routine x 2)     Status: None   Collection Time: 03/27/16  5:55 PM  Result Value Ref Range Status   Specimen Description BLOOD RIGHT ANTECUBITAL  Final   Special Requests BOTTLES DRAWN AEROBIC AND ANAEROBIC 5CC  Final   Culture NO GROWTH 5 DAYS  Final   Report Status 04/01/2016 FINAL  Final  Urine culture     Status: Abnormal   Collection Time: 03/27/16  6:05 PM  Result Value Ref Range Status   Specimen Description URINE, RANDOM  Final   Special Requests NONE  Final   Culture <10,000 COLONIES/mL INSIGNIFICANT GROWTH (A)  Final   Report Status 03/29/2016 FINAL  Final    Coagulation Studies: No results for input(s): LABPROT, INR in the last 72 hours.  Urinalysis: No results for input(s): COLORURINE, LABSPEC, PHURINE, GLUCOSEU,  HGBUR, BILIRUBINUR, KETONESUR, PROTEINUR, UROBILINOGEN, NITRITE, LEUKOCYTESUR in the last 72 hours.  Invalid input(s): APPERANCEUR    Imaging: No results found.   Medications:   . sodium chloride    . sodium chloride     . amiodarone  400 mg Oral BID  . aspirin  81 mg Oral Daily  . atorvastatin  40 mg Oral Daily  . calcitRIOL  0.25 mcg Oral Daily  . calcium carbonate  500 mg of elemental calcium Oral BID  . heparin subcutaneous  5,000 Units Subcutaneous Q8H  . insulin aspart  0-5 Units Subcutaneous QHS  . insulin aspart  0-9 Units Subcutaneous TID WC  . metoprolol succinate  25 mg Oral Daily  . predniSONE  10 mg Oral Q breakfast  . sodium chloride flush  3 mL Intravenous Q12H  . sodium chloride flush  3 mL Intravenous Q12H  . tiotropium  18 mcg Inhalation q morning - 10a   sodium chloride, acetaminophen **OR** acetaminophen, albuterol, bisacodyl, diphenhydrAMINE, guaiFENesin-codeine, ipratropium-albuterol, meclizine, ondansetron **OR** ondansetron (ZOFRAN) IV, oxyCODONE-acetaminophen, senna-docusate, sodium chloride flush  Assessment/ Plan:  Mr. MERCED HANNERS is a 71 y.o. black male with alcoholism, systolic congestive heart failure, CVA, hypertension, coronary artery disease, hyperlipidemia, who was admitted to [facility] on 07/21/2016  1. Acute renal failure  Acute renal failure secondary to acute cardiorenal syndrome  Baseline creatinine 1.32.  Jul 21, 2016 Serum creatinine  peaked at 5.87.     Permcath placed 4/19  Third dialysis treatment on 4/21 According to clinical social worker notes, patient to be discharged to [facility] care once stable  BUN/creatinine have sufficiently improved. Uremic symptoms also appear to have improved  OK to discharge patient with permcath. We will evaluate him in the office this coming week to figure out whether he needs to continue dialysis or not.   2. Acute exacerbation of chronic systolic congestive heart failure: echo 12/17 EF  15-20%., pulmonary Hypertension holding diuretics. No signs of volume overload - Monitor volume status.   3. COPD acute exacerbation:  - steroids and supportive care.   4. Acidosis - d/c bicarb drip - monitor - improved with HD  5. Hypocalcemia -  vitamin D - calcium supplements 500 twice a day - improving. Today 7.1  6. CML - patient prefers to follow up at [facility] - Hem/Onc evaluation pending      LOS: 11 Keenan Trefry 4/22/201812:52 PM

## 2016-08-01 NOTE — Progress Notes (Signed)
Patient ID: Bruce [name], male   DOB: Jan 19, 1946, 71 y.o.   MRN: [id number]  [facility] PROGRESS NOTE  Bruce [name] NWG:[id number] DOB: [birth year] DOA: 07/21/2016 PCP: [name], MD  HPI/Subjective: Patient breathing. Patient had hemodialysis yesterday and 1 unit of blood transfusion yesterday. Has chronic history of CML and sees [facility] oncology, prefers local hematologists.  Objective: Vitals:   08/01/16 0431 08/01/16 1052  BP: (!) 103/56 (!) 95/53  Pulse: (!) 59 (!) 56  Resp: 19   Temp: 97.9 F (36.6 C) 98 F (36.7 C)    Filed Weights   07/31/16 1555 07/31/16 1854 08/01/16 0500  Weight: 80 kg (176 lb 5.9 oz) 78.7 kg (173 lb 8 oz) 77.1 kg (169 lb 15.6 oz)    ROS: Review of Systems  Constitutional: Negative for chills and fever.  Eyes: Negative for blurred vision.  Respiratory: Positive for cough and shortness of breath.   Cardiovascular: Negative for chest pain.  Gastrointestinal: Negative for abdominal pain, constipation, diarrhea, nausea and vomiting.  Genitourinary: Negative for dysuria.  Musculoskeletal: Negative for joint pain.  Neurological: Negative for dizziness and headaches.   Exam: Physical Exam  HENT:  Nose: No mucosal edema.  Mouth/Throat: No oropharyngeal exudate or posterior oropharyngeal edema.  Eyes: Conjunctivae, EOM and lids are normal. Pupils are equal, round, and reactive to light.  Neck: No JVD present. Carotid bruit is not present. No edema present. No thyroid mass and no thyromegaly present.  Cardiovascular: S1 normal and S2 normal.  Exam reveals no gallop.   No murmur heard. Pulses:      Dorsalis pedis pulses are 2+ on the right side, and 2+ on the left side.  Respiratory: No respiratory distress. He has decreased breath sounds in the right lower field. He has no wheezes. He has no rhonchi. He has no rales.  . Anterior chest wall with triple lumen catheter for hemodialysis  GI: Soft. Bowel sounds are normal. There is no  tenderness.  Musculoskeletal:       Right shoulder: He exhibits no swelling.  Lymphadenopathy:    He has no cervical adenopathy.  Neurological: He is alert. No cranial nerve deficit.  Skin: Skin is warm. No rash noted. Nails show no clubbing.  Psychiatric: He has a normal mood and affect.      Data Reviewed: Basic Metabolic Panel:  Recent Labs Lab 07/25/16 2115  07/28/16 0540 07/29/16 0347 07/30/16 0540 07/31/16 0559 08/01/16 0641  NA  --   < > 135 138 138 135 136  K  --   < > 4.3 4.9 4.7 4.9 4.2  CL  --   < > 96* 97* 98* 101 100*  CO2  --   < > 25 22 26 23 28   GLUCOSE  --   < > 186* 106* 231* 342* 310*  BUN  --   < > 114* 132* 114* QUANTITY NOT SUFFICIENT, UNABLE TO PERFORM TEST 78*  CREATININE  --   < > 5.10* 5.20* 4.43* 3.46* 2.76*  CALCIUM  --   < > 5.5* 6.0* 6.2* 6.8* 7.1*  MG 2.7*  --  2.6* 2.8*  --   --   --   PHOS  --   --   --  5.5*  --   --  2.4*  < > = values in this interval not displayed. Liver Function Tests:  Recent Labs Lab 07/29/16 0347 08/01/16 0641  ALBUMIN 3.7 2.5*   CBC:  Recent Labs Lab 07/27/16 [id number]  07/28/16 0540 07/31/16 0745 07/31/16 0848 08/01/16 0641  WBC 23.1* 29.7* 70.7* 70.4* 77.4*  HGB 10.3* 10.2* 7.8* 7.5* 7.4*  HCT 33.0* 31.0* 26.2* 25.1* 24.6*  MCV 93.2 90.9 94.0 92.1 89.6  PLT 687* 727* 508* 520* 373   BNP (last 3 results)  Recent Labs  04/04/16 1103 05/02/16 1115 07/21/16 0917  BNP 1,141.8* 2,219.0* 3,630.0*    CBG:  Recent Labs Lab 07/30/16 2051 07/31/16 1146 07/31/16 2117 08/01/16 0817 08/01/16 1155  GLUCAP 322* 367* 325* 306* 272*     Scheduled Meds: . amiodarone  400 mg Oral BID  . aspirin  81 mg Oral Daily  . atorvastatin  40 mg Oral Daily  . calcitRIOL  0.25 mcg Oral Daily  . calcium carbonate  500 mg of elemental calcium Oral BID  . heparin subcutaneous  5,000 Units Subcutaneous Q8H  . insulin aspart  0-5 Units Subcutaneous QHS  . insulin aspart  0-9 Units Subcutaneous TID WC  .  metoprolol succinate  25 mg Oral Daily  . predniSONE  10 mg Oral Q breakfast  . sodium chloride flush  3 mL Intravenous Q12H  . sodium chloride flush  3 mL Intravenous Q12H  . tiotropium  18 mcg Inhalation q morning - 10a   Continuous Infusions: . sodium chloride    . sodium chloride      Assessment/Plan:  1.  Acute kidney injury on chronic kidney disease with hyperkalemia and hyponatremia. Overdiuresis with cardiorenal syndrome as per nephrology. Had 3 episodes of dialysis until now 2. Copd exacerbation- tapered solumedrol to 10mg  from 4/21 3. Acute on chronic combined Congestive heart failure, NYHA class III. Been holding diuretics most of the hospital stay secondary to acute kidney injury. This was likely not acute congestive heart failure. BNP can be falsely elevated with people on entresto and kidney failure. Follow up with Dr. Candis Musa 4.  Arrhythmia on amiodarone as per cardiology- [facility];  ?NSVT-According to telemetry, EKG reveals tachycardia. Cardiology is recommending to continue amiodarone 400 mg by mouth twice a day for 2 weeks then 200 mg once daily 5.  Essential hypertension. Blood pressure is low. Amiodarone is on hold at this time 6.  CML on aspirin,Consult local oncology 7.  Hyperlipidemia unspecified on atorvastatin 8.  History of TIA and stroke on aspirin 9.  Impaired fasting glucose secondary to steroids on sliding scale 10. Anemia of chronic kidney disease with chronic history of CML-received one unit of blood transfusion during dialysis 07/31/2016 and oncology consult is placed to local oncology group as per patient's request Code Status:     Code Status Orders        Start     Ordered   07/21/16 1207  Full code  Continuous     07/21/16 1206    Code Status History    Date Active Date Inactive Code Status Order ID Comments User Context   05/02/2016  6:59 PM 05/07/2016 10:52 PM Full Code [id number]  Idelle Crouch, MD Inpatient   03/21/2016  6:40 PM 03/23/2016  6:39  PM Full Code [id number]  Idelle Crouch, MD Inpatient   10/10/2015 11:59 PM 10/15/2015  7:08 PM Full Code [id number]  Ivor Costa, MD ED   06/04/2015  1:54 AM 06/06/2015  5:28 PM Full Code [id number]  [name], MD ED   01/25/2015  3:16 AM 01/29/2015  5:17 PM Full Code [id number]  Lavina Hamman, MD ED   09/13/2014  5:07 PM 09/22/2014  6:09 PM Full Code [id number]  Louellen Molder, MD Inpatient   03/20/2014 11:33 PM 03/21/2014  5:46 PM Full Code [id number]  Arne Cleveland, MD Inpatient   03/18/2014 11:46 PM 03/20/2014 11:33 PM Full Code [id number]  [name], MD Inpatient   03/05/2014  9:04 PM 03/06/2014  6:57 PM Full Code [id number]  [name], MD Inpatient     Disposition Plan: To be determined  Consultants:  Nephrology  Cardiology  Procedures: - Dialysis catheter  Time spent: 26 minutes  Rai Sinagra, [employer]

## 2016-08-01 NOTE — Progress Notes (Signed)
SS enema given, with effective results. Stool was hard formed moderate amount. Pt. Given warm prune juice and coffee along with 1 prn senna and 1 prn bisacodyl. Stool sample collected.

## 2016-08-01 NOTE — Progress Notes (Signed)
Pts SBP is <100 a patients HR is <60  MD notified via text about RN holding amiodarone and metoprolol. I will continue to assess. MD notified RN via phone and acknowledged the holding of morning dosages of amiodarone and metoprolol.

## 2016-08-02 DIAGNOSIS — Z515 Encounter for palliative care: Secondary | ICD-10-CM

## 2016-08-02 DIAGNOSIS — K921 Melena: Secondary | ICD-10-CM

## 2016-08-02 DIAGNOSIS — F4323 Adjustment disorder with mixed anxiety and depressed mood: Secondary | ICD-10-CM

## 2016-08-02 DIAGNOSIS — Z7189 Other specified counseling: Secondary | ICD-10-CM

## 2016-08-02 DIAGNOSIS — I509 Heart failure, unspecified: Secondary | ICD-10-CM

## 2016-08-02 LAB — GLUCOSE, CAPILLARY
GLUCOSE-CAPILLARY: 165 mg/dL — AB (ref 65–99)
GLUCOSE-CAPILLARY: 198 mg/dL — AB (ref 65–99)
Glucose-Capillary: 326 mg/dL — ABNORMAL HIGH (ref 65–99)
Glucose-Capillary: 210 mg/dL — ABNORMAL HIGH (ref 65–99)
Glucose-Capillary: 234 mg/dL — ABNORMAL HIGH (ref 65–99)

## 2016-08-02 LAB — BASIC METABOLIC PANEL
Anion gap: 7 (ref 5–15)
BUN: 85 mg/dL — AB (ref 6–20)
CHLORIDE: 102 mmol/L (ref 101–111)
CO2: 26 mmol/L (ref 22–32)
CREATININE: 2.89 mg/dL — AB (ref 0.61–1.24)
Calcium: 7.4 mg/dL — ABNORMAL LOW (ref 8.9–10.3)
GFR calc Af Amer: 24 mL/min — ABNORMAL LOW (ref >60)
GFR calc non Af Amer: 21 mL/min — ABNORMAL LOW (ref >60)
Glucose, Bld: 189 mg/dL — ABNORMAL HIGH (ref 65–99)
POTASSIUM: 4 mmol/L (ref 3.5–5.1)
SODIUM: 135 mmol/L (ref 135–145)

## 2016-08-02 LAB — OCCULT BLOOD X 1 CARD TO LAB, STOOL: Fecal Occult Bld: POSITIVE — AB

## 2016-08-02 LAB — CBC
HCT: 23.2 % — ABNORMAL LOW (ref 40.0–52.0)
HEMOGLOBIN: 6.9 g/dL — AB (ref 13.0–18.0)
MCH: 26.9 pg (ref 26.0–34.0)
MCHC: 29.7 g/dL — ABNORMAL LOW (ref 32.0–36.0)
MCV: 90.5 fL (ref 80.0–100.0)
Platelets: 372 10*3/uL (ref 150–440)
RBC: 2.56 MIL/uL — AB (ref 4.40–5.90)
RDW: 19.8 % — ABNORMAL HIGH (ref 11.5–14.5)
WBC: 84 10*3/uL (ref 3.8–10.6)

## 2016-08-02 LAB — HEMOGLOBIN AND HEMATOCRIT, BLOOD
HCT: 19.5 % — ABNORMAL LOW (ref 40.0–52.0)
HEMATOCRIT: 21.7 % — AB (ref 40.0–52.0)
HEMATOCRIT: 24.5 % — AB (ref 40.0–52.0)
HEMOGLOBIN: 6 g/dL — AB (ref 13.0–18.0)
Hemoglobin: 6.4 g/dL — ABNORMAL LOW (ref 13.0–18.0)
Hemoglobin: 7.7 g/dL — ABNORMAL LOW (ref 13.0–18.0)

## 2016-08-02 LAB — PREPARE RBC (CROSSMATCH)

## 2016-08-02 MED ORDER — PANTOPRAZOLE SODIUM 40 MG IV SOLR
40.0000 mg | Freq: Two times a day (BID) | INTRAVENOUS | Status: DC
  Administered 2016-08-02 – 2016-08-04 (×4): 40 mg via INTRAVENOUS
  Filled 2016-08-02 (×4): qty 40

## 2016-08-02 MED ORDER — SODIUM CHLORIDE 0.9 % IV SOLN: Freq: Once | INTRAVENOUS | Status: DC

## 2016-08-02 MED ORDER — METOPROLOL SUCCINATE ER 25 MG PO TB24
12.5000 mg | ORAL_TABLET | Freq: Every day | ORAL | Status: DC
  Administered 2016-08-03 – 2016-08-04 (×2): 12.5 mg via ORAL
  Filled 2016-08-02 (×2): qty 1

## 2016-08-02 NOTE — Progress Notes (Signed)
Hbg 6.9, Dr. Marcille Blanco, texted paged. Awaiting call back.

## 2016-08-02 NOTE — Progress Notes (Signed)
PT Cancellation Note  Patient Details Name: Bruce [name] MRN: [id number] DOB: Jan 25, 1946   Cancelled Treatment:    Reason Eval/Treat Not Completed: Other (comment). Spoke with pt today regarding PT; pt wants to walk, but discussed low hemoglobin levels limiting pt's activity/participation with PT. Pt denies any adverse symptoms other than fatigue. Pt wishes to sit to edge of bed and does so with supervision. Pt discusses his status and and has questions regarding blood transfusions and if it would help his condition. Pt encouraged to talk to doctor/nursing regarding. Pt states he will make a decision regarding care moving forward tomorrow. Sat with pt 5 min; pt wished to remain seated and notes he understands not to rise to stand without calling for assist. Re attempt treatment at a later time/date as pt's medical status allows.    Larae Grooms, PTA 08/02/2016, 3:05 PM

## 2016-08-02 NOTE — Progress Notes (Signed)
Made aware by dr. Margaretmary Eddy patient is refusing blood. Upon entering room patient is refusing blood states " its in gods hand" I do not want any blood. Patient is okay to be seen by GI md. Will continue to monitor closely

## 2016-08-02 NOTE — Consult Note (Signed)
Consultation Note Date: 08/02/2016   Patient Name: Bruce [name]  DOB: 1945/12/21  MRN: [id number]  Age / Sex: 71 y.o., male  PCP: [name], MD Referring Physician: Nicholes Mango, MD  Reason for Consultation: Establishing goals of care  HPI/Patient Profile: 71 y.o. male  with past medical history of CML (followed at [facility], but noncompliant with treatment), CHF, drug use, admitted on 07/21/2016 with SOB, CP, cough. Workup thus far reveals anemia, acute kidney injury (on dialysis), GI bleeding. Patient refusing further blood transfusion and workup for GI bleeding. Palliative medicine consulted for [place].   Clinical Assessment and Goals of Care: Met with patient at bedside.   Pleasant gentleman appears stated age. Originally from [place] area. Tells me he lives "on the streets" most of his life. Lives in hotels and shelters. He was married at one time. His wife died. She was on a ventilator for several days. He was with her.  I introduced Palliative medicine as Palliative medicine is specialized medical care for people living with serious illness. It focuses on providing relief from the symptoms and stress of a serious illness. The goal is to improve quality of life for both the patient and the family.  We discussed his medical state and his current understanding of what's going on. He is concerned he's being experimented on. I validated his concerns. We discussed the historical aspects of African American history in relation to healthcare.   He is unsure regarding his [place]. There is a part of him that wants to go to an "old folks home" and just be comfortable and allowed to die. Another part of him isn't ready to die. He is certain he wouldn't want any "big surgery" and he wouldn't want to be on a ventilator for a long time, but a short time is ok for now. He is tired of being in the hospital. He wants to be  able to get up by himself, bathe himself, shave himself, sit up in the chair. We talked about the option of comfort care and hospice and this piqued his interest. He agrees to explore this more tomorrow.   He agrees to blood transfusion tonight, and to further discussing plan with me tomorrow.    Primary Decision Maker PATIENT    SUMMARY OF RECOMMENDATIONS -Patient agrees to one more blood transfusion tonight -Pt would like to pursue option of Hospice at nursing facility -Pt wishes to remain full code  Code Status/Advance Care Planning:  Full code   Palliative Prophylaxis:   Delirium Protocol - elderly patient at risk due to prolonged hospitalization  Additional Recommendations (Limitations, Scope, Preferences):  Full Scope Treatment   Prognosis:    Unable to determine  Discharge Planning: To Be Determined  Primary Diagnoses: Present on Admission: . Acute on chronic combined systolic and diastolic CHF, NYHA class 3 ([place]) . CML (chronic myelocytic leukemia) ([place]) . Coronary artery disease, non-occlusive . Acute respiratory distress . Essential hypertension . CKD (chronic kidney disease), stage II . Elevated troponin   I have  reviewed the medical record, interviewed the patient and family, and examined the patient. The following aspects are pertinent.  Past Medical History:  Diagnosis Date  . Bell's palsy   . Chronic combined systolic and diastolic CHF, NYHA class 3 ([place])   . CKD (chronic kidney disease), stage II   . CML (chronic myelocytic leukemia) ([place])   . Coronary artery disease, non-occlusive   . Hypercholesterolemia   . Hypertension   . Leukemia ([place])   . Stroke ([place])    No residual limb weakness.  Walks with cane at baseline.   Marland Kitchen TIA (transient ischemic attack) 05/10/2014   Social History   Social History  . Marital status: Widowed    Spouse name: N/A  . Number of children: 0  . Years of education: N/A   Occupational History  . retired     Social History Main Topics  . Smoking status: Former Smoker    Packs/day: 0.50    Years: 50.00    Types: Cigarettes  . Smokeless tobacco: Never Used  . Alcohol use No  . Drug use: Yes  . Sexual activity: No   Other Topics Concern  . None   Social History Narrative   Patient is right handed.   Patient drinks 1-2 cups daily.   Family History  Problem Relation Age of Onset  . Diabetes Mother   . Hypertension Mother   . Diabetes Father   . Hypertension Father   . Diabetes Brother   . Hypertension Brother   . Diabetes Sister   . Hypertension Sister   . Diabetes Brother   . Hypertension Brother   . Diabetes Sister   . Hypertension Sister    Scheduled Meds: . amiodarone  400 mg Oral BID  . atorvastatin  40 mg Oral Daily  . calcitRIOL  0.25 mcg Oral Daily  . calcium carbonate  500 mg of elemental calcium Oral BID  . insulin aspart  0-5 Units Subcutaneous QHS  . insulin aspart  0-9 Units Subcutaneous TID WC  . [START ON 08/03/2016] metoprolol succinate  12.5 mg Oral Daily  . pantoprazole (PROTONIX) IV  40 mg Intravenous Q12H  . predniSONE  10 mg Oral Q breakfast  . sodium chloride flush  3 mL Intravenous Q12H  . sodium chloride flush  3 mL Intravenous Q12H  . tiotropium  18 mcg Inhalation q morning - 10a   Continuous Infusions: . sodium chloride    . sodium chloride    . sodium chloride     PRN Meds:.sodium chloride, acetaminophen **OR** acetaminophen, albuterol, bisacodyl, diphenhydrAMINE, guaiFENesin-codeine, ipratropium-albuterol, meclizine, ondansetron **OR** ondansetron (ZOFRAN) IV, oxyCODONE-acetaminophen, senna-docusate, sodium chloride flush Medications Prior to Admission:  Prior to Admission medications   Medication Sig Start Date End Date Taking? Authorizing Provider  acetaminophen (TYLENOL) 325 MG tablet Take 2 tablets (650 mg total) by mouth every 4 (four) hours as needed for headache or mild pain. 06/06/15  Yes [name], MD  albuterol (PROVENTIL  HFA;VENTOLIN HFA) 108 (90 BASE) MCG/ACT inhaler Inhale 2 puffs into the lungs every 4 (four) hours as needed for wheezing or shortness of breath. 08/08/14  Yes Noemi Chapel, MD  aspirin 81 MG chewable tablet Chew 1 tablet (81 mg total) by mouth daily. 03/23/16  Yes Loletha Grayer, MD  atorvastatin (LIPITOR) 40 MG tablet Take 1 tablet (40 mg total) by mouth daily. 12/25/15  Yes Amy D Clegg, NP  bosutinib (BOSULIF) 100 MG tablet Take 300 mg by mouth daily with breakfast. Take with food.  Yes Historical Provider, MD  carvedilol (COREG) 6.25 MG tablet Take 1 tablet (6.25 mg total) by mouth 2 (two) times daily. 05/05/16  Yes Theodoro Grist, MD  clopidogrel (PLAVIX) 75 MG tablet Take 1 tablet (75 mg total) by mouth daily. 03/21/14  Yes [name], MD  ENTRESTO 24-26 MG Take 1 tablet by mouth 2 (two) times daily. 03/19/16  Yes Historical Provider, MD  furosemide (LASIX) 40 MG tablet Take 40 mg by mouth daily.  03/19/16  Yes Historical Provider, MD  metFORMIN (GLUCOPHAGE) 500 MG tablet Take 500 mg by mouth 2 (two) times daily with a meal.   Yes Historical Provider, MD  metoprolol succinate (TOPROL-XL) 25 MG 24 hr tablet Take 25 mg by mouth daily.   Yes Historical Provider, MD  spironolactone (ALDACTONE) 25 MG tablet Take 1 tablet (25 mg total) by mouth daily. 05/06/16  Yes Theodoro Grist, MD  tiotropium (SPIRIVA) 18 MCG inhalation capsule Place 18 mcg into inhaler and inhale daily.   Yes Historical Provider, MD  amiodarone (PACERONE) 200 MG tablet Take 1 tablet (200 mg total) by mouth daily. Patient not taking: Reported on 05/02/2016 12/30/15   Shirley Friar, PA-C  meclizine (ANTIVERT) 25 MG tablet Take 1 tablet (25 mg total) by mouth 3 (three) times daily as needed for dizziness. Patient not taking: Reported on 07/21/2016 05/07/16   Theodoro Grist, MD   No Known Allergies Review of Systems  Constitutional: Positive for fatigue.  Respiratory: Positive for shortness of breath.     Physical Exam    Constitutional: He is oriented to person, place, and time. He appears well-developed and well-nourished.  Pulmonary/Chest: Effort normal.  Abdominal: Soft.  Neurological: He is alert and oriented to person, place, and time.    Vital Signs: BP (!) 96/42   Pulse (!) 58   Temp 98 F (36.7 C) (Axillary)   Resp 19   Ht 5' 8"  (1.727 m)   Wt 77 kg (169 lb 11.2 oz)   SpO2 99%   BMI 25.80 kg/m  Pain Assessment: No/denies pain POSS *See Group Information*: 1-Acceptable,Awake and alert Pain Score: 0-No pain   SpO2: SpO2: 99 % O2 Device:SpO2: 99 % O2 Flow Rate: .O2 Flow Rate (L/min): 2 L/min  IO: Intake/output summary:  Intake/Output Summary (Last 24 hours) at 08/02/16 1657 Last data filed at 08/02/16 1500  Gross per 24 hour  Intake             1000 ml  Output             1375 ml  Net             -375 ml    LBM: Last BM Date: 08/02/16 Baseline Weight: Weight: 75.8 kg (167 lb) Most recent weight: Weight: 77 kg (169 lb 11.2 oz)     Palliative Assessment/Data: PPS: 50%   Flowsheet Rows     Most Recent Value  Intake Tab  Referral Department  Hospitalist  Unit at Time of Referral  Cardiac/Telemetry Unit  Palliative Care Primary Diagnosis  Nephrology  Date Notified  08/02/16  Palliative Care Type  New Palliative care  Reason for referral  Clarify Goals of Care  Date of Admission  07/21/16  # of days IP prior to Palliative referral  12  Clinical Assessment  Psychosocial & Spiritual Assessment  Palliative Care Outcomes      Thank you for this consult. Palliative medicine will continue to follow and assist as needed.   Time In: 1530 Time  Out: 1700 Time Total: 90 minutes Greater than 50%  of this time was spent counseling and coordinating care related to the above assessment and plan.  Signed by: Mariana Kaufman, AGNP-C Palliative Medicine    Please contact Palliative Medicine Team phone at [phone] for questions and concerns.  For individual provider: See  Shea Evans

## 2016-08-02 NOTE — Consult Note (Signed)
[facility] Face-to-Face Psychiatry Consult   Reason for Consult:  Consult for 71 year old man with a history of multiple medical problems currently in the hospital with renal failure and severe anemia. Questions raised about capacity Referring Physician:  Gouru Patient Identification: Bruce [name] MRN:  [id number] Principal Diagnosis: Adjustment disorder with mixed anxiety and depressed mood Diagnosis:   Patient Active Problem List   Diagnosis Date Noted  . Adjustment disorder with mixed anxiety and depressed mood [F43.23] 08/02/2016  . Melena [K92.1]   . Acute on chronic systolic CHF (congestive heart failure) ([place]) [I50.23] 05/07/2016  . Unsteady gait [R26.81] 05/07/2016  . Atypical chest pain [R07.89] 05/05/2016  . Cardiomyopathy [I42.0] 05/05/2016  . Atrial arrhythmia [I49.8] 05/05/2016  . Muscle weakness (generalized) [M62.81]   . Thrombocytosis ([place]) [D47.3]   . Chest pain [R07.9] 05/02/2016  . Near syncope [R55] 05/02/2016  . Acute respiratory distress [R06.03] 03/22/2016  . Homeless [Z59.0] 03/22/2016  . Slurred speech [R47.81] 03/22/2016  . History of stroke [Z86.73] 03/22/2016  . Elevated lactic acid level [R79.89] 10/11/2015  . CKD (chronic kidney disease), stage II [N18.2]   . Dizziness [R42] 01/25/2015  . Acute on chronic combined systolic and diastolic CHF, NYHA class 3 ([place]) [I50.43] 01/25/2015  . Essential hypertension [I10] 01/25/2015  . Compliance poor [Z91.19] 01/25/2015  . Coronary artery disease, non-occlusive [I25.10]   . H/O: stroke with residual effects [I69.30] 09/13/2014  . Transaminitis [R74.0] 09/13/2014  . Elevated troponin [R74.8] 09/13/2014  . TIA (transient ischemic attack) [G45.9] 05/10/2014  . CML (chronic myelocytic leukemia) ([place]) [C92.10] 03/27/2014  . Leukocytosis [D72.829] 03/18/2014    Total Time spent with patient: 1 hour  Subjective:   Bruce [name] is a 71 y.o. male patient admitted with "I was weak".  HPI:  Patient interviewed  chart reviewed. 71 year old man in the hospital with multiple medical problems. Now requiring dialysis and severely anemic with GI bleeding. Patient has told staff that he does not want to get any further dialysis and had also earlier today been saying he would not get any more blood transfusions. Questions were raised about capacity. Patient was able to describe for me his feelings and the events leading up to coming into the hospital. He was able to tell me that since being here he was started on dialysis. Patient was not able to give me a very clear picture of what his understanding was of dialysis. He told me that he thought dialysis was "take the old blood out and put the new blood in". He seems slightly surprised when I told him that it was the same blood simply being cleaned. He did have an understanding that his blood counts were very low and that he had been bleeding internally. Patient was able to tell me that he knew that if he did not get dialysis that he was likely to die from it. He also was able to tell me that he knew that his blood counts were so low that he might die from anemia. He told me that it was extremely important to him to make his own decisions and he did not want anyone forcing anything on him or making any decisions for him. He denied being depressed. Denied suicidal thoughts. Denied any psychotic symptoms.  Social history: Patient states that he gets a pension and disability. Before coming into the hospital had been staying at [facility]. He reports that he would potentially be agreeable to going to a "rest home" although he is considering calling one  of his relatives and simply going to live with them instead.  Medical history: Multiple medical problems heart failure history of strokes history of leukemia now on dialysis with GI bleed of unclear source.  Substance abuse history: Patient has a past history of cocaine abuse. He denies that he had recently been abusing any drugs or  alcohol.  Past Psychiatric History: No known past psychiatric history other than for cocaine abuse at one time. He denies ever having tried to kill himself denies any history of psychosis. Doesn't appear to be on any psychiatric medicine recently.  Risk to Self: Is patient at risk for suicide?: No Risk to Others:   Prior Inpatient Therapy:   Prior Outpatient Therapy:    Past Medical History:  Past Medical History:  Diagnosis Date  . Bell's palsy   . Chronic combined systolic and diastolic CHF, NYHA class 3 ([place])   . CKD (chronic kidney disease), stage II   . CML (chronic myelocytic leukemia) ([place])   . Coronary artery disease, non-occlusive   . Hypercholesterolemia   . Hypertension   . Leukemia ([place])   . Stroke ([place])    No residual limb weakness.  Walks with cane at baseline.   Marland Kitchen TIA (transient ischemic attack) 05/10/2014    Past Surgical History:  Procedure Laterality Date  . BACK SURGERY    . DIALYSIS/PERMA CATHETER INSERTION N/A 07/29/2016   Procedure: Dialysis/Perma Catheter Insertion;  Surgeon: Algernon Huxley, MD;  Location: [place] [facility];  Service: Cardiovascular;  Laterality: N/A;  . HIP ARTHROPLASTY Right    orif  . ORIF FOREARM FRACTURE Right    Family History:  Family History  Problem Relation Age of Onset  . Diabetes Mother   . Hypertension Mother   . Diabetes Father   . Hypertension Father   . Diabetes Brother   . Hypertension Brother   . Diabetes Sister   . Hypertension Sister   . Diabetes Brother   . Hypertension Brother   . Diabetes Sister   . Hypertension Sister    Family Psychiatric  History: Denies any family history Social History:  History  Alcohol Use No     History  Drug Use    Social History   Social History  . Marital status: Widowed    Spouse name: N/A  . Number of children: 0  . Years of education: N/A   Occupational History  . retired    Social History Main Topics  . Smoking status: Former Smoker    Packs/day: 0.50     Years: 50.00    Types: Cigarettes  . Smokeless tobacco: Never Used  . Alcohol use No  . Drug use: Yes  . Sexual activity: No   Other Topics Concern  . None   Social History Narrative   Patient is right handed.   Patient drinks 1-2 cups daily.   Additional Social History:    Allergies:  No Known Allergies  Labs:  Results for orders placed or performed during the hospital encounter of 07/21/16 (from the past 48 hour(s))  Glucose, capillary     Status: Abnormal   Collection Time: 07/31/16  9:17 PM  Result Value Ref Range   Glucose-Capillary 325 (H) 65 - 99 mg/dL  CBC     Status: Abnormal   Collection Time: 08/01/16  6:41 AM  Result Value Ref Range   WBC 77.4 (HH) 3.8 - 10.6 K/uL    Comment: CRITICAL VALUE NOTED.  VALUE IS CONSISTENT WITH PREVIOUSLY REPORTED  AND CALLED VALUE.   RBC 2.75 (L) 4.40 - 5.90 MIL/uL   Hemoglobin 7.4 (L) 13.0 - 18.0 g/dL   HCT 24.6 (L) 40.0 - 52.0 %   MCV 89.6 80.0 - 100.0 fL   MCH 26.8 26.0 - 34.0 pg   MCHC 29.9 (L) 32.0 - 36.0 g/dL   RDW 19.5 (H) 11.5 - 14.5 %   Platelets 373 150 - 440 K/uL  Renal function panel     Status: Abnormal   Collection Time: 08/01/16  6:41 AM  Result Value Ref Range   Sodium 136 135 - 145 mmol/L   Potassium 4.2 3.5 - 5.1 mmol/L   Chloride 100 (L) 101 - 111 mmol/L   CO2 28 22 - 32 mmol/L   Glucose, Bld 310 (H) 65 - 99 mg/dL   BUN 78 (H) 6 - 20 mg/dL   Creatinine, Ser 2.76 (H) 0.61 - 1.24 mg/dL   Calcium 7.1 (L) 8.9 - 10.3 mg/dL   Phosphorus 2.4 (L) 2.5 - 4.6 mg/dL   Albumin 2.5 (L) 3.5 - 5.0 g/dL   GFR calc non Af Amer 22 (L) >60 mL/min   GFR calc Af Amer 25 (L) >60 mL/min    Comment: (NOTE) The eGFR has been calculated using the CKD EPI equation. This calculation has not been validated in all clinical situations. eGFR's persistently <60 mL/min signify possible Chronic Kidney Disease.    Anion gap 8 5 - 15  Glucose, capillary     Status: Abnormal   Collection Time: 08/01/16  8:17 AM  Result Value Ref Range    Glucose-Capillary 306 (H) 65 - 99 mg/dL  Glucose, capillary     Status: Abnormal   Collection Time: 08/01/16 11:55 AM  Result Value Ref Range   Glucose-Capillary 272 (H) 65 - 99 mg/dL  Glucose, capillary     Status: Abnormal   Collection Time: 08/01/16  4:58 PM  Result Value Ref Range   Glucose-Capillary 380 (H) 65 - 99 mg/dL  Glucose, capillary     Status: Abnormal   Collection Time: 08/01/16  8:28 PM  Result Value Ref Range   Glucose-Capillary 271 (H) 65 - 99 mg/dL   Comment 1 Notify RN    Comment 2 Document in Chart   Occult blood card to lab, stool RN will collect     Status: Abnormal   Collection Time: 08/01/16 11:54 PM  Result Value Ref Range   Fecal Occult Bld POSITIVE (A) NEGATIVE  CBC     Status: Abnormal   Collection Time: 08/02/16  5:36 AM  Result Value Ref Range   WBC 84.0 (HH) 3.8 - 10.6 K/uL    Comment: CRITICAL VALUE NOTED.  VALUE IS CONSISTENT WITH PREVIOUSLY REPORTED AND CALLED VALUE.   RBC 2.56 (L) 4.40 - 5.90 MIL/uL   Hemoglobin 6.9 (L) 13.0 - 18.0 g/dL   HCT 23.2 (L) 40.0 - 52.0 %   MCV 90.5 80.0 - 100.0 fL   MCH 26.9 26.0 - 34.0 pg   MCHC 29.7 (L) 32.0 - 36.0 g/dL   RDW 19.8 (H) 11.5 - 14.5 %   Platelets 372 150 - 440 K/uL  Basic metabolic panel     Status: Abnormal   Collection Time: 08/02/16  5:36 AM  Result Value Ref Range   Sodium 135 135 - 145 mmol/L   Potassium 4.0 3.5 - 5.1 mmol/L   Chloride 102 101 - 111 mmol/L   CO2 26 22 - 32 mmol/L   Glucose, Bld 189 (H)  65 - 99 mg/dL   BUN 85 (H) 6 - 20 mg/dL   Creatinine, Ser 2.89 (H) 0.61 - 1.24 mg/dL   Calcium 7.4 (L) 8.9 - 10.3 mg/dL   GFR calc non Af Amer 21 (L) >60 mL/min   GFR calc Af Amer 24 (L) >60 mL/min    Comment: (NOTE) The eGFR has been calculated using the CKD EPI equation. This calculation has not been validated in all clinical situations. eGFR's persistently <60 mL/min signify possible Chronic Kidney Disease.    Anion gap 7 5 - 15  Glucose, capillary     Status: Abnormal    Collection Time: 08/02/16  7:44 AM  Result Value Ref Range   Glucose-Capillary 165 (H) 65 - 99 mg/dL  Hemoglobin and hematocrit, blood     Status: Abnormal   Collection Time: 08/02/16 10:44 AM  Result Value Ref Range   Hemoglobin 6.4 (L) 13.0 - 18.0 g/dL   HCT 21.7 (L) 40.0 - 52.0 %  Prepare RBC     Status: None   Collection Time: 08/02/16 11:30 AM  Result Value Ref Range   Order Confirmation ORDER PROCESSED BY BLOOD BANK   Glucose, capillary     Status: Abnormal   Collection Time: 08/02/16 11:54 AM  Result Value Ref Range   Glucose-Capillary 210 (H) 65 - 99 mg/dL  Hemoglobin and hematocrit, blood     Status: Abnormal   Collection Time: 08/02/16  4:15 PM  Result Value Ref Range   Hemoglobin 6.0 (L) 13.0 - 18.0 g/dL   HCT 19.5 (L) 40.0 - 52.0 %  Glucose, capillary     Status: Abnormal   Collection Time: 08/02/16  4:38 PM  Result Value Ref Range   Glucose-Capillary 234 (H) 65 - 99 mg/dL    Current Facility-Administered Medications  Medication Dose Route Frequency Provider Last Rate Last Dose  . 0.9 %  sodium chloride infusion  250 mL Intravenous PRN Demetrios Loll, MD      . 0.9 %  sodium chloride infusion   Intravenous Once Illene Silver Gouru, MD      . 0.9 %  sodium chloride infusion   Intravenous Once Nicholes Mango, MD      . acetaminophen (TYLENOL) tablet 650 mg  650 mg Oral Q6H PRN Demetrios Loll, MD       Or  . acetaminophen (TYLENOL) suppository 650 mg  650 mg Rectal Q6H PRN Demetrios Loll, MD      . albuterol (PROVENTIL) (2.5 MG/3ML) 0.083% nebulizer solution 2.5 mg  2.5 mg Nebulization Q2H PRN Demetrios Loll, MD   2.5 mg at 07/23/16 0507  . amiodarone (PACERONE) tablet 400 mg  400 mg Oral BID Deboraha Sprang, MD   Stopped at 08/02/16 1030  . atorvastatin (LIPITOR) tablet 40 mg  40 mg Oral Daily Demetrios Loll, MD   Stopped at 08/02/16 1030  . bisacodyl (DULCOLAX) EC tablet 5 mg  5 mg Oral Daily PRN Demetrios Loll, MD   5 mg at 08/01/16 2345  . calcitRIOL (ROCALTROL) capsule 0.25 mcg  0.25 mcg Oral Daily  Murlean Iba, MD   Stopped at 08/02/16 1031  . calcium carbonate (TUMS - dosed in mg elemental calcium) chewable tablet 500 mg of elemental calcium  500 mg of elemental calcium Oral BID Lenis Noon, [facility]   Stopped at 08/02/16 1031  . diphenhydrAMINE (BENADRYL) capsule 25 mg  25 mg Oral Q6H PRN Demetrios Loll, MD   25 mg at 07/26/16 2239  . guaiFENesin-codeine 100-10 MG/5ML  solution 10 mL  10 mL Oral Q6H PRN Dustin Flock, MD   10 mL at 07/27/16 0834  . insulin aspart (novoLOG) injection 0-5 Units  0-5 Units Subcutaneous QHS Demetrios Loll, MD   3 Units at 08/01/16 2226  . insulin aspart (novoLOG) injection 0-9 Units  0-9 Units Subcutaneous TID WC Demetrios Loll, MD   3 Units at 08/02/16 1730  . ipratropium-albuterol (DUONEB) 0.5-2.5 (3) MG/3ML nebulizer solution 3 mL  3 mL Nebulization Q6H PRN Loletha Grayer, MD   3 mL at 07/28/16 0835  . meclizine (ANTIVERT) tablet 25 mg  25 mg Oral TID PRN Demetrios Loll, MD      . Derrill Memo ON 08/03/2016] metoprolol succinate (TOPROL-XL) 24 hr tablet 12.5 mg  12.5 mg Oral Daily Aruna Gouru, MD      . ondansetron (ZOFRAN) tablet 4 mg  4 mg Oral Q6H PRN Demetrios Loll, MD   4 mg at 07/22/16 0830   Or  . ondansetron (ZOFRAN) injection 4 mg  4 mg Intravenous Q6H PRN Demetrios Loll, MD   4 mg at 07/24/16 1934  . oxyCODONE-acetaminophen (PERCOCET/ROXICET) 5-325 MG per tablet 2 tablet  2 tablet Oral Q6H PRN Nicholes Mango, MD   2 tablet at 08/01/16 1103  . pantoprazole (PROTONIX) injection 40 mg  40 mg Intravenous Q12H Aruna Gouru, MD      . predniSONE (DELTASONE) tablet 10 mg  10 mg Oral Q breakfast Loletha Grayer, MD   10 mg at 08/02/16 0839  . senna-docusate (Senokot-S) tablet 1 tablet  1 tablet Oral QHS PRN Demetrios Loll, MD   1 tablet at 08/01/16 2345  . sodium chloride flush (NS) 0.9 % injection 3 mL  3 mL Intravenous Q12H Demetrios Loll, MD   3 mL at 08/01/16 2226  . sodium chloride flush (NS) 0.9 % injection 3 mL  3 mL Intravenous Q12H Demetrios Loll, MD   3 mL at 08/02/16 1038  . sodium chloride flush  (NS) 0.9 % injection 3 mL  3 mL Intravenous PRN Demetrios Loll, MD      . tiotropium [facility]) inhalation capsule 18 mcg  18 mcg Inhalation q morning - 10a Demetrios Loll, MD   18 mcg at 08/02/16 1038    Musculoskeletal: Strength & Muscle Tone: decreased Gait & Station: unable to stand Patient leans: Backward  Psychiatric Specialty Exam: Physical Exam  Nursing note and vitals reviewed. Constitutional: He appears well-developed and well-nourished.  HENT:  Head: Normocephalic and atraumatic.  Eyes: Conjunctivae are normal. Pupils are equal, round, and reactive to light.  Neck: Normal range of motion.  Cardiovascular: Normal heart sounds.   Respiratory: Effort normal. No respiratory distress.  GI: Soft.  Musculoskeletal: Normal range of motion.  Neurological: He is alert.  Skin: Skin is warm and dry.  Psychiatric: He has a normal mood and affect. His speech is normal. Judgment normal. He is slowed. Thought content is not paranoid. Cognition and memory are normal. He expresses no homicidal and no suicidal ideation.    Review of Systems  Constitutional: Positive for malaise/fatigue.  HENT: Negative.   Eyes: Negative.   Respiratory: Negative.   Cardiovascular: Negative.   Gastrointestinal: Negative.   Musculoskeletal: Negative.   Skin: Negative.   Neurological: Positive for weakness.  Psychiatric/Behavioral: Negative for depression, hallucinations, memory loss, substance abuse and suicidal ideas. The patient is not nervous/anxious and does not have insomnia.     Blood pressure (!) 103/51, pulse (!) 59, temperature 97.9 F (36.6 C), temperature source Axillary, resp. rate  18, height _0  (1.727 m), weight 77 kg (169 lb 11.2 oz), SpO2 98 %.Body mass index is 25.8 kg/m.  General Appearance: Casual  Eye Contact:  Good  Speech:  Slow  Volume:  Decreased  Mood:  Euthymic  Affect:  Constricted  Thought Process:  Goal Directed  Orientation:  Full (Time, Place, and Person)  Thought Content:   Rumination  Suicidal Thoughts:  No  Homicidal Thoughts:  No  Memory:  Immediate;   Good Recent;   Fair Remote;   Fair  Judgement:  Fair  Insight:  Shallow  Psychomotor Activity:  Decreased  Concentration:  Concentration: Fair  Recall:  [employer] of Knowledge:  Fair  Language:  Fair  Akathisia:  No  Handed:  Right  AIMS (if indicated):     Assets:  Communication Skills Resilience  ADL's:  Impaired  Cognition:  Impaired,  Mild  Sleep:        Treatment Plan Summary: Plan 71 year old man with severe and life-threatening medical problems. Patient had intermittently refused dialysis. Despite this he is able to tell me that he understands that if he continues to refuse dialysis he will likely die from it. He also says that he had preferred not to get any more blood transfusions even though he understands that he is liable to die from bleeding. Aches very clear to me that it is of the utmost importance to him to make his own decisions and not be forced into anything. Although his understanding of dialysis was poor he was able to follow my description of it during the interview. Patient is not actively suicidal but strongly feels that he should be allowed to make his own decisions. He appears to be generally reconciled to death without being depressed about it. I think on the whole he should be considered to have capacity to make medical decisions at this point. I will follow as needed.  Disposition: No evidence of imminent risk to self or others at present.   Patient does not meet criteria for psychiatric inpatient admission. Supportive therapy provided about ongoing stressors.  Alethia Berthold, MD 08/02/2016 7:33 PM

## 2016-08-02 NOTE — Progress Notes (Signed)
Patient ID: Bruce [name], male   DOB: [birth year], 71 y.o.   MRN: [id number]  Bruce [name]  Bruce [name]:[id number] DOB: 10-06-1945 DOA: 07/21/2016 PCP: [name], MD  HPI/Subjective: Patient  he is reporting black tarry stool and his hemoglobin trended down to 6.4, refusing blood transfusions and any more hemodialysis in future and endoscopies. Patient  had hemodialysis 4/21  and 1 unit of blood transfusion 4/21    Objective: Vitals:   08/02/16 0838 08/02/16 1111  BP: (!) 100/49 (!) 102/45  Pulse: (!) 59 (!) 57  Resp: 18 18  Temp: 98.5 F (36.9 C) 98.4 F (36.9 C)    Filed Weights   07/31/16 1854 08/01/16 0500 08/02/16 0449  Weight: 78.7 kg (173 lb 8 oz) 77.1 kg (169 lb 15.6 oz) 77 kg (169 lb 11.2 oz)    ROS: Review of Systems  Constitutional: Negative for chills and fever.  Eyes: Negative for blurred vision.  Respiratory: Positive for cough and shortness of breath.   Cardiovascular: Negative for chest pain.  Gastrointestinal: Negative for abdominal pain, constipation, diarrhea, nausea and vomiting.  Genitourinary: Negative for dysuria.  Musculoskeletal: Negative for joint pain.  Neurological: Negative for dizziness and headaches.   Exam: Physical Exam  HENT:  Nose: No mucosal edema.  Mouth/Throat: No oropharyngeal exudate or posterior oropharyngeal edema.  Eyes: Conjunctivae, EOM and lids are normal. Pupils are equal, round, and reactive to light.  Neck: No JVD present. Carotid bruit is not present. No edema present. No thyroid mass and no thyromegaly present.  Cardiovascular: S1 normal and S2 normal.  Exam reveals no gallop.   No murmur heard. Pulses:      Dorsalis pedis pulses are 2+ on the right side, and 2+ on the left side.  Respiratory: No respiratory distress. He has decreased breath sounds in the right lower field. He has no wheezes. He has no rhonchi. He has no rales.  . Anterior chest wall with triple lumen catheter for  hemodialysis  GI: Soft. Bowel sounds are normal. There is no tenderness.  Musculoskeletal:       Right shoulder: He exhibits no swelling.  Lymphadenopathy:    He has no cervical adenopathy.  Neurological: He is alert. No cranial nerve deficit.  Skin: Skin is warm. No rash noted. Nails show no clubbing.  Psychiatric: He has a normal mood and affect.      Data Reviewed: Basic Metabolic Panel:  Recent Labs Lab 07/28/16 0540 07/29/16 0347 07/30/16 0540 07/31/16 0559 08/01/16 0641 08/02/16 0536  NA 135 138 138 135 136 135  K 4.3 4.9 4.7 4.9 4.2 4.0  CL 96* 97* 98* 101 100* 102  CO2 25 22 26 23 28 26   GLUCOSE 186* 106* 231* 342* 310* 189*  BUN 114* 132* 114* QUANTITY NOT SUFFICIENT, UNABLE TO PERFORM TEST 78* 85*  CREATININE 5.10* 5.20* 4.43* 3.46* 2.76* 2.89*  CALCIUM 5.5* 6.0* 6.2* 6.8* 7.1* 7.4*  MG 2.6* 2.8*  --   --   --   --   PHOS  --  5.5*  --   --  2.4*  --    Liver Function Tests:  Recent Labs Lab 07/29/16 0347 08/01/16 0641  ALBUMIN 3.7 2.5*   CBC:  Recent Labs Lab 07/28/16 0540 07/31/16 0745 07/31/16 0848 08/01/16 0641 08/02/16 0536 08/02/16 1044  WBC 29.7* 70.7* 70.4* 77.4* 84.0*  --   HGB 10.2* 7.8* 7.5* 7.4* 6.9* 6.4*  HCT 31.0* 26.2* 25.1* 24.6* 23.2* 21.7*  MCV 90.9 94.0 92.1 89.6 90.5  --   PLT 727* 508* 520* 373 372  --    BNP (last 3 results)  Recent Labs  04/04/16 1103 05/02/16 1115 07/21/16 0917  BNP 1,141.8* 2,219.0* 3,630.0*    CBG:  Recent Labs Lab 08/01/16 1155 08/01/16 1658 08/01/16 2028 08/02/16 0744 08/02/16 1154  GLUCAP 272* 380* 271* 165* 210*     Scheduled Meds: . amiodarone  400 mg Oral BID  . atorvastatin  40 mg Oral Daily  . calcitRIOL  0.25 mcg Oral Daily  . calcium carbonate  500 mg of elemental calcium Oral BID  . insulin aspart  0-5 Units Subcutaneous QHS  . insulin aspart  0-9 Units Subcutaneous TID WC  . [START ON 08/03/2016] metoprolol succinate  12.5 mg Oral Daily  . predniSONE  10 mg Oral Q  breakfast  . sodium chloride flush  3 mL Intravenous Q12H  . sodium chloride flush  3 mL Intravenous Q12H  . tiotropium  18 mcg Inhalation q morning - 10a   Continuous Infusions: . sodium chloride    . sodium chloride    . sodium chloride      Assessment/Plan:    Acute on chronic Anemia of chronic kidney disease with melena, chronic history of CML-received one unit of blood transfusion during dialysis 07/31/2016 and Hemoglobin is at 6.4 today. Patient is refusing blood transfusion. Aspirin and heparin subcutaneous was discontinued. Seen by gastroenterology recommended EGD but patient is refusing any kind of procedures at this point. He denies any depression and looks competent to make decisions Psychiatry and palliative care consult placed. Patient wishes to continue full CODE STATUS  .  Acute kidney injury on chronic kidney disease with hyperkalemia and hyponatremia. Overdiuresis with cardiorenal syndrome as per nephrology. Had 3 episodes of dialysis until now,Refusing dialysis in future if needed 2. Copd exacerbation- tapered solumedrol to 10mg  from 4/21 3. Acute on chronic combined Congestive heart failure, NYHA class III. Been holding diuretics most of the hospital stay secondary to acute kidney injury. This was likely not acute congestive heart failure. BNP can be falsely elevated with people on entresto and kidney failure. Follow up with Dr. Candis Musa 4.  Arrhythmia on amiodarone as per cardiology- [facility];  ?NSVT-According to telemetry, EKG reveals tachycardia. Cardiology is recommending to continue amiodarone 400 mg by mouth twice a day for 2 weeks then 200 mg once daily 5.  Essential hypertension. Blood pressure is low. Amiodarone is on hold at this time 6.  CML on aspirin,Consult local oncology 7.  Hyperlipidemia unspecified on atorvastatin 8.  History of TIA and stroke on aspirin 9.  Impaired fasting glucose secondary to steroids on sliding scale Code Status:     Code Status Orders         Start     Ordered   07/21/16 1207  Full code  Continuous     07/21/16 1206    Code Status History    Date Active Date Inactive Code Status Order ID Comments User Context   05/02/2016  6:59 PM 05/07/2016 10:52 PM Full Code [id number]  Idelle Crouch, MD Inpatient   03/21/2016  6:40 PM 03/23/2016  6:39 PM Full Code [id number]  Idelle Crouch, MD Inpatient   10/10/2015 11:59 PM 10/15/2015  7:08 PM Full Code [id number]  Ivor Costa, MD ED   06/04/2015  1:54 AM 06/06/2015  5:28 PM Full Code [id number]  [name], MD ED   01/25/2015  3:16 AM 01/29/2015  5:17 PM  Full Code [id number]  Lavina Hamman, MD ED   09/13/2014  5:07 PM 09/22/2014  6:09 PM Full Code [id number]  Louellen Molder, MD Inpatient   03/20/2014 11:33 PM 03/21/2014  5:46 PM Full Code [id number]  Arne Cleveland, MD Inpatient   03/18/2014 11:46 PM 03/20/2014 11:33 PM Full Code [id number]  [name], MD Inpatient   03/05/2014  9:04 PM 03/06/2014  6:57 PM Full Code [id number]  [name], MD Inpatient     Disposition Plan: To be determined Clinical condition guarded. Refusing blood transfusion, EGD or any other procedures. He is aware that by refusing the recommended treatments he might die. Reports that he wants to go back to [employer]. Wishes to continue full CODE STATUS  Consultants:  Nephrology  Cardiology  Procedures: - Dialysis catheter  Time spent: 45minutes. RN Crystal was present during the event a discussion with the patient  Ahria Slappey, [employer]

## 2016-08-02 NOTE — Care Management (Signed)
Patient is now experiencing drop in hemoglobin, melena and positive hemocult.  He is declining any more blood transfusions. Says He does not know if he wants to continue "all this."  Patient does resist treatment options which delays progression of care and ultimate discharge. Patient does have CML and does not comply with follow up at [facility].

## 2016-08-02 NOTE — Progress Notes (Signed)
[facility]  ROUNDING NOTE   Subjective:  Patient due for hemodialysis again tomorrow. Hemoglobin low at 6.4. Appreciate gastroenterology input. Patient declines any procedures to further explore potential of GI bleed.  Objective:  Vital signs in last 24 hours:  Temp:  [98.2 F (36.8 C)-98.5 F (36.9 C)] 98.4 F (36.9 C) (04/23 1111) Pulse Rate:  [55-59] 57 (04/23 1111) Resp:  [18] 18 (04/23 1111) BP: (97-107)/(45-57) 102/45 (04/23 1111) SpO2:  [79 %-98 %] 96 % (04/23 1130) Weight:  [77 kg (169 lb 11.2 oz)] 77 kg (169 lb 11.2 oz) (04/23 0449)  Weight change: -3.025 kg (-6 lb 10.7 oz) Filed Weights   07/31/16 1854 08/01/16 0500 08/02/16 0449  Weight: 78.7 kg (173 lb 8 oz) 77.1 kg (169 lb 15.6 oz) 77 kg (169 lb 11.2 oz)    Intake/Output: I/O last 3 completed shifts: In: 480 [P.O.:480] Out: 1225 [Urine:1225]   Intake/Output this shift:  Total I/O In: 240 [P.O.:240] Out: 200 [Urine:200]  Physical Exam: General: NAD  Head: Normocephalic, atraumatic. Moist oral mucosal membranes  Eyes: Muddy sclera   Neck: Supple, trachea midline  Lungs:  Clear to auscultation bilaterally   Heart: Regular rate and rhythm  Abdomen:  Soft, nontender  Extremities: No peripheral edema.  Neurologic: Nonfocal, alert, oriented  Skin: No lesions  Access: Right IJ PC    Basic Metabolic Panel:  Recent Labs Lab 07/28/16 0540 07/29/16 0347 07/30/16 0540 07/31/16 0559 08/01/16 0641 08/02/16 0536  NA 135 138 138 135 136 135  K 4.3 4.9 4.7 4.9 4.2 4.0  CL 96* 97* 98* 101 100* 102  CO2 25 22 26 23 28 26   GLUCOSE 186* 106* 231* 342* 310* 189*  BUN 114* 132* 114* QUANTITY NOT SUFFICIENT, UNABLE TO PERFORM TEST 78* 85*  CREATININE 5.10* 5.20* 4.43* 3.46* 2.76* 2.89*  CALCIUM 5.5* 6.0* 6.2* 6.8* 7.1* 7.4*  MG 2.6* 2.8*  --   --   --   --   PHOS  --  5.5*  --   --  2.4*  --     Liver Function Tests:  Recent Labs Lab 07/29/16 0347 08/01/16 0641  ALBUMIN 3.7 2.5*   No  results for input(s): LIPASE, AMYLASE in the last 168 hours. No results for input(s): AMMONIA in the last 168 hours.  CBC:  Recent Labs Lab 07/28/16 0540 07/31/16 0745 07/31/16 0848 08/01/16 0641 08/02/16 0536 08/02/16 1044  WBC 29.7* 70.7* 70.4* 77.4* 84.0*  --   HGB 10.2* 7.8* 7.5* 7.4* 6.9* 6.4*  HCT 31.0* 26.2* 25.1* 24.6* 23.2* 21.7*  MCV 90.9 94.0 92.1 89.6 90.5  --   PLT 727* 508* 520* 373 372  --     Cardiac Enzymes: No results for input(s): CKTOTAL, CKMB, CKMBINDEX, TROPONINI in the last 168 hours.  BNP: Invalid input(s): POCBNP  CBG:  Recent Labs Lab 08/01/16 1155 08/01/16 1658 08/01/16 2028 08/02/16 0744 08/02/16 1154  GLUCAP 272* 380* 271* 165* 210*    Microbiology: Results for orders placed or performed during the hospital encounter of 03/27/16  Culture, blood (Routine x 2)     Status: None   Collection Time: 03/27/16  5:35 PM  Result Value Ref Range Status   Specimen Description BLOOD LEFT ANTECUBITAL  Final   Special Requests BOTTLES DRAWN AEROBIC AND ANAEROBIC 5CC  Final   Culture NO GROWTH 5 DAYS  Final   Report Status 04/01/2016 FINAL  Final  Culture, blood (Routine x 2)     Status: None  Collection Time: 03/27/16  5:55 PM  Result Value Ref Range Status   Specimen Description BLOOD RIGHT ANTECUBITAL  Final   Special Requests BOTTLES DRAWN AEROBIC AND ANAEROBIC 5CC  Final   Culture NO GROWTH 5 DAYS  Final   Report Status 04/01/2016 FINAL  Final  Urine culture     Status: Abnormal   Collection Time: 03/27/16  6:05 PM  Result Value Ref Range Status   Specimen Description URINE, RANDOM  Final   Special Requests NONE  Final   Culture <10,000 COLONIES/mL INSIGNIFICANT GROWTH (A)  Final   Report Status 03/29/2016 FINAL  Final    Coagulation Studies: No results for input(s): LABPROT, INR in the last 72 hours.  Urinalysis: No results for input(s): COLORURINE, LABSPEC, PHURINE, GLUCOSEU, HGBUR, BILIRUBINUR, KETONESUR, PROTEINUR,  UROBILINOGEN, NITRITE, LEUKOCYTESUR in the last 72 hours.  Invalid input(s): APPERANCEUR    Imaging: No results found.   Medications:   . sodium chloride    . sodium chloride    . sodium chloride     . amiodarone  400 mg Oral BID  . atorvastatin  40 mg Oral Daily  . calcitRIOL  0.25 mcg Oral Daily  . calcium carbonate  500 mg of elemental calcium Oral BID  . insulin aspart  0-5 Units Subcutaneous QHS  . insulin aspart  0-9 Units Subcutaneous TID WC  . [START ON 08/03/2016] metoprolol succinate  12.5 mg Oral Daily  . predniSONE  10 mg Oral Q breakfast  . sodium chloride flush  3 mL Intravenous Q12H  . sodium chloride flush  3 mL Intravenous Q12H  . tiotropium  18 mcg Inhalation q morning - 10a   sodium chloride, acetaminophen **OR** acetaminophen, albuterol, bisacodyl, diphenhydrAMINE, guaiFENesin-codeine, ipratropium-albuterol, meclizine, ondansetron **OR** ondansetron (ZOFRAN) IV, oxyCODONE-acetaminophen, senna-docusate, sodium chloride flush  Assessment/ Plan:  Bruce [name] is a 71 y.o. black male with alcoholism, systolic congestive heart failure, CVA, hypertension, coronary artery disease, hyperlipidemia, who was admitted to [facility] on 07/21/2016  1. Acute renal failure  Acute renal failure secondary to acute cardiorenal syndrome  Baseline creatinine 1.32.  Jul 21, 2016 Serum creatinine  peaked at 5.87.     Permcath placed 4/19  Third dialysis treatment on 4/21 -  BUN of 85 with a creatinine of 2.89.  We will reconsider dialysis tomorrow if BUN and creatinine continued to trend up.  2. Acute exacerbation of chronic systolic congestive heart failure: echo 12/17 EF 15-20%., pulmonary Hypertension -  Hemoglobin currently trending down.  Therefore suspect that he's not volume overloaded.  3. COPD acute exacerbation:  - steroids and supportive care.   4. Acidosis - improved with bicarbonate drip and dialysis.  Serum bicarbonate currently 26.  5. Hypocalcemia -   Calcium up to 7.4.  Continue calcitriol.  6. CML - patient prefers to follow up at [facility] - evaluation management per oncology.     LOS: 12 Zyanne Schumm 4/23/20182:20 PM

## 2016-08-02 NOTE — Progress Notes (Addendum)
   07/30/16 0314  PPD Results  Does patient have an induration at the injection site? No  Induration(mm) 0 mm    Hepatitis  Results for JOSEPHANTHONY, TINDEL (MRN [id number]) as of 08/02/2016 15:23  Ref. Range 07/28/2016 05:40  Hepatitis B Surface Ag Latest Ref Range: Negative  Negative  Hep B S Ab Unknown Reactive

## 2016-08-02 NOTE — Progress Notes (Signed)
Dr. Marcille Blanco made aware of fecal occult being positive per text message.

## 2016-08-02 NOTE — Progress Notes (Addendum)
Spoke with dr. Margaretmary Eddy regarding bp and hr. Per md will change metoprolol to 12.5mg  and still hold new dose will continue to monitor. Also made md aware patient hemoglobin is 6.9

## 2016-08-02 NOTE — Clinical Social Work Note (Signed)
CSW continuing to follow patient's progress throughout discharge planning.  Patient has a bed at [facility], patient not medically ready for discharge yet per md.  Jones Broom. [place], MSW, [place]  08/02/2016 1:52 PM

## 2016-08-02 NOTE — Progress Notes (Signed)
Patient continues to refused blood transfusion. Palliative care ordered, patient remains full code uncertain of becoming DNR. Palliative will try to consult patient today or tomorrow

## 2016-08-02 NOTE — Consult Note (Signed)
Lucilla Lame, MD [facility]  [address], [place], [place] [address] Phone: [phone] Fax : [phone]  Consultation  Referring Provider:     Dr. Margaretmary Eddy Primary Care Physician:  [name], MD Primary Gastroenterologist:  Althia Forts         Reason for Consultation:     Melena  Date of Admission:  07/21/2016 Date of Consultation:  08/02/2016         HPI:   Bruce [name] is a 71 y.o. male who has a history of CHF and chronic kidney disease CMA now her artery disease hypertension stroke and TIAs who was noted to have melena today. The patient has been seen by nephrology and has had dialysis. Patient's hemoglobin has trended down with a hemoglobin of 7.82 days ago with a hemoglobin of 6.4 today. The patient's hemoglobin had reached a high of 11 on the 12th of this month. The patient denies any abdominal pain nausea vomiting fevers or chills. The patient also reports that his main concern now is that he wants to eat. 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 The patient also reports that he has not had any history of black stools or bloody stools.  Past Medical History:  Diagnosis Date  . Bell's palsy   . Chronic combined systolic and diastolic CHF, NYHA class 3 ([place])   . CKD (chronic kidney disease), stage II   . CML (chronic myelocytic leukemia) ([place])   . Coronary artery disease, non-occlusive   . Hypercholesterolemia   . Hypertension   . Leukemia ([place])   . Stroke ([place])    No residual limb weakness.  Walks with cane at baseline.   Marland Kitchen TIA (transient ischemic attack) 05/10/2014    Past Surgical History:  Procedure Laterality Date  . BACK SURGERY    . DIALYSIS/PERMA CATHETER INSERTION N/A 07/29/2016   Procedure: Dialysis/Perma Catheter Insertion;  Surgeon: Algernon Huxley, MD;  Location: [place] [facility];  Service: Cardiovascular;  Laterality: N/A;  . HIP ARTHROPLASTY Right    orif  . ORIF FOREARM FRACTURE Right     Prior to Admission medications     Medication Sig Start Date End Date Taking? Authorizing Provider  acetaminophen (TYLENOL) 325 MG tablet Take 2 tablets (650 mg total) by mouth every 4 (four) hours as needed for headache or mild pain. 06/06/15  Yes [name], MD  albuterol (PROVENTIL HFA;VENTOLIN HFA) 108 (90 BASE) MCG/ACT inhaler Inhale 2 puffs into the lungs every 4 (four) hours as needed for wheezing or shortness of breath. 08/08/14  Yes Noemi Chapel, MD  aspirin 81 MG chewable tablet Chew 1 tablet (81 mg total) by mouth daily. 03/23/16  Yes Loletha Grayer, MD  atorvastatin (LIPITOR) 40 MG tablet Take 1 tablet (40 mg total) by mouth daily. 12/25/15  Yes Amy D Clegg, NP  bosutinib (BOSULIF) 100 MG tablet Take 300 mg by mouth daily with breakfast. Take with food.   Yes Historical Provider, MD  carvedilol (COREG) 6.25 MG tablet Take 1 tablet (6.25 mg total) by mouth 2 (two) times daily. 05/05/16  Yes Theodoro Grist, MD  clopidogrel (PLAVIX) 75 MG tablet Take 1 tablet (75 mg total) by mouth daily. 03/21/14  Yes [name], MD  ENTRESTO 24-26 MG Take 1 tablet by mouth 2 (two) times daily. 03/19/16  Yes Historical Provider, MD  furosemide (LASIX) 40 MG tablet Take 40 mg by mouth daily.  03/19/16  Yes Historical Provider, MD  metFORMIN (GLUCOPHAGE) 500 MG tablet  Take 500 mg by mouth 2 (two) times daily with a meal.   Yes Historical Provider, MD  metoprolol succinate (TOPROL-XL) 25 MG 24 hr tablet Take 25 mg by mouth daily.   Yes Historical Provider, MD  spironolactone (ALDACTONE) 25 MG tablet Take 1 tablet (25 mg total) by mouth daily. 05/06/16  Yes Theodoro Grist, MD  tiotropium (SPIRIVA) 18 MCG inhalation capsule Place 18 mcg into inhaler and inhale daily.   Yes Historical Provider, MD  amiodarone (PACERONE) 200 MG tablet Take 1 tablet (200 mg total) by mouth daily. Patient not taking: Reported on 05/02/2016 12/30/15   Shirley Friar, PA-C  meclizine (ANTIVERT) 25 MG tablet Take 1 tablet (25 mg total) by mouth 3 (three) times  daily as needed for dizziness. Patient not taking: Reported on 07/21/2016 05/07/16   Theodoro Grist, MD    Family History  Problem Relation Age of Onset  . Diabetes Mother   . Hypertension Mother   . Diabetes Father   . Hypertension Father   . Diabetes Brother   . Hypertension Brother   . Diabetes Sister   . Hypertension Sister   . Diabetes Brother   . Hypertension Brother   . Diabetes Sister   . Hypertension Sister      Social History  Substance Use Topics  . Smoking status: Former Smoker    Packs/day: 0.50    Years: 50.00    Types: Cigarettes  . Smokeless tobacco: Never Used  . Alcohol use No    Allergies as of 07/21/2016  . (No Known Allergies)    Review of Systems:    All systems reviewed and negative except where noted in HPI.   Physical Exam:  Vital signs in last 24 hours: Temp:  [98.2 F (36.8 C)-98.5 F (36.9 C)] 98.4 F (36.9 C) (04/23 1111) Pulse Rate:  [55-59] 57 (04/23 1111) Resp:  [18] 18 (04/23 1111) BP: (97-107)/(45-57) 102/45 (04/23 1111) SpO2:  [79 %-98 %] 79 % (04/23 1111) Weight:  [169 lb 11.2 oz (77 kg)] 169 lb 11.2 oz (77 kg) (04/23 0449) Last BM Date: 08/02/16 General:   Pleasant, cooperative in NAD Head:  Normocephalic and atraumatic. Eyes:   No icterus.   Conjunctiva pink. PERRLA. Ears:  Normal auditory acuity. Neck:  Supple; no masses or thyroidomegaly Lungs: Respirations even and unlabored. Lungs clear to auscultation bilaterally.   No wheezes, crackles, or rhonchi.  Heart:  Regular rate and rhythm;  Without murmur, clicks, rubs or gallops Abdomen:  Soft, nondistended, nontender. Normal bowel sounds. No appreciable masses or hepatomegaly.  No rebound or guarding.  Rectal:  Not performed. Msk:  Symmetrical without gross deformities.    Extremities:  Without edema, cyanosis or clubbing. Neurologic:  Alert only orientated to person and place;  grossly normal neurologically. Skin:  Intact without significant lesions or rashes. Cervical  Nodes:  No significant cervical adenopathy. Psych:  Alert and cooperative. Normal affect.  LAB RESULTS:  Recent Labs  07/31/16 0848 08/01/16 0641 08/02/16 0536 08/02/16 1044  WBC 70.4* 77.4* 84.0*  --   HGB 7.5* 7.4* 6.9* 6.4*  HCT 25.1* 24.6* 23.2* 21.7*  PLT 520* 373 372  --    BMET  Recent Labs  07/31/16 0559 08/01/16 0641 08/02/16 0536  NA 135 136 135  K 4.9 4.2 4.0  CL 101 100* 102  CO2 23 28 26   GLUCOSE 342* 310* 189*  BUN QUANTITY NOT SUFFICIENT, UNABLE TO PERFORM TEST 78* 85*  CREATININE 3.46* 2.76* 2.89*  CALCIUM  6.8* 7.1* 7.4*   LFT  Recent Labs  08/01/16 0641  ALBUMIN 2.5*   PT/INR No results for input(s): LABPROT, INR in the last 72 hours.  STUDIES: No results found.    Impression / Plan:   Bruce [name] is a 71 y.o. y/o male with Melena and a drop in hemoglobin to 6.4 today from a high of 11 back on the 12th of this month. The patient has been trending downward since then. The patient states he makes his own medical decisions and refuses any procedures at this time. The patient has been explained that the black stools are an indication that he may be having bleeding from his upper GI tract and that an upper endoscopy can be done relatively easily to stop the bleeding if he is still bleeding. The patient continues to refuse the procedure and states he just wants to eat. No endoscopy will be planned at this time unless the patient changes his mind and wants to proceed with a GI workup.   Thank you for involving me in the care of this patient.      LOS: 12 days   Lucilla Lame, MD  08/02/2016, 12:27 PM   Note: This dictation was prepared with Dragon dictation along with smaller phrase technology. Any transcriptional errors that result from this process are unintentional.

## 2016-08-02 NOTE — Progress Notes (Signed)
Inpatient Diabetes Program Recommendations  AACE/ADA: New Consensus Statement on Inpatient Glycemic Control (2015)  Target Ranges:  Prepandial:   less than 140 mg/dL      Peak postprandial:   less than 180 mg/dL (1-2 hours)      Critically ill patients:  140 - 180 mg/dL   Results for CARRY, ORTEZ (MRN [id number]) as of 08/02/2016 09:14  Ref. Range 08/01/2016 08:17 08/01/2016 11:55 08/01/2016 16:58 08/01/2016 20:28 08/02/2016 07:44  Glucose-Capillary Latest Ref Range: 65 - 99 mg/dL 306 (H) 272 (H) 380 (H) 271 (H) 165 (H)   Review of Glycemic Control   Current orders for Inpatient glycemic control: Novolog 0-9 units TID with meals, Novolog 0-5 units QHS  Inpatient Diabetes Program Recommendations: Insulin - Basal: If steroids are continued, please consider ordering Levemir 5 units Q24H. Insulin - Meal Coverage: Please consider ordering Novolog 3 units TID with meals for meal coverage (in addition to Novolog correction scale) if patient eats at least 50% of meal.  Thanks, Barnie Alderman, RN, MSN, CDE Diabetes Coordinator Inpatient Diabetes Program [phone] (Team Pager from 8am to 5pm)

## 2016-08-03 DIAGNOSIS — D649 Anemia, unspecified: Secondary | ICD-10-CM

## 2016-08-03 DIAGNOSIS — K921 Melena: Secondary | ICD-10-CM

## 2016-08-03 DIAGNOSIS — Z515 Encounter for palliative care: Secondary | ICD-10-CM

## 2016-08-03 DIAGNOSIS — I5023 Acute on chronic systolic (congestive) heart failure: Secondary | ICD-10-CM

## 2016-08-03 DIAGNOSIS — Z7189 Other specified counseling: Secondary | ICD-10-CM

## 2016-08-03 LAB — BASIC METABOLIC PANEL
Anion gap: 5 (ref 5–15)
BUN: 69 mg/dL — ABNORMAL HIGH (ref 6–20)
CHLORIDE: 104 mmol/L (ref 101–111)
CO2: 29 mmol/L (ref 22–32)
CREATININE: 2.77 mg/dL — AB (ref 0.61–1.24)
Calcium: 7.2 mg/dL — ABNORMAL LOW (ref 8.9–10.3)
GFR calc non Af Amer: 22 mL/min — ABNORMAL LOW (ref >60)
GFR, EST AFRICAN AMERICAN: 25 mL/min — AB (ref >60)
Glucose, Bld: 162 mg/dL — ABNORMAL HIGH (ref 65–99)
POTASSIUM: 3.6 mmol/L (ref 3.5–5.1)
SODIUM: 138 mmol/L (ref 135–145)

## 2016-08-03 LAB — GLUCOSE, CAPILLARY
GLUCOSE-CAPILLARY: 139 mg/dL — AB (ref 65–99)
Glucose-Capillary: 224 mg/dL — ABNORMAL HIGH (ref 65–99)
Glucose-Capillary: 357 mg/dL — ABNORMAL HIGH (ref 65–99)
Glucose-Capillary: 333 mg/dL — ABNORMAL HIGH (ref 65–99)

## 2016-08-03 LAB — CBC
HCT: 23.2 % — ABNORMAL LOW (ref 40.0–52.0)
HEMOGLOBIN: 7.3 g/dL — AB (ref 13.0–18.0)
MCH: 28 pg (ref 26.0–34.0)
MCHC: 31.6 g/dL — ABNORMAL LOW (ref 32.0–36.0)
MCV: 88.6 fL (ref 80.0–100.0)
Platelets: 277 10*3/uL (ref 150–440)
RBC: 2.62 MIL/uL — AB (ref 4.40–5.90)
RDW: 18.5 % — ABNORMAL HIGH (ref 11.5–14.5)
WBC: 61 10*3/uL (ref 3.8–10.6)

## 2016-08-03 MED ORDER — MORPHINE SULFATE (CONCENTRATE) 10 MG/0.5ML PO SOLN: 5.0000 mg | ORAL | Status: DC | PRN

## 2016-08-03 MED ORDER — HALOPERIDOL 0.5 MG PO TABS: 0.5000 mg | ORAL_TABLET | ORAL | Status: DC | PRN

## 2016-08-03 MED ORDER — HALOPERIDOL LACTATE 2 MG/ML PO CONC
0.5000 mg | ORAL | Status: DC | PRN
  Filled 2016-08-03: qty 0.3

## 2016-08-03 MED ORDER — LORAZEPAM 1 MG PO TABS: 1.0000 mg | ORAL_TABLET | ORAL | Status: DC | PRN

## 2016-08-03 MED ORDER — GLYCOPYRROLATE 0.2 MG/ML IJ SOLN
0.2000 mg | INTRAMUSCULAR | Status: DC | PRN
  Filled 2016-08-03: qty 1

## 2016-08-03 MED ORDER — POLYVINYL ALCOHOL 1.4 % OP SOLN
1.0000 [drp] | Freq: Four times a day (QID) | OPHTHALMIC | Status: DC | PRN
  Filled 2016-08-03: qty 15

## 2016-08-03 MED ORDER — LORAZEPAM 2 MG/ML PO CONC: 1.0000 mg | ORAL | Status: DC | PRN

## 2016-08-03 MED ORDER — GLYCOPYRROLATE 1 MG PO TABS
1.0000 mg | ORAL_TABLET | ORAL | Status: DC | PRN
  Filled 2016-08-03: qty 1

## 2016-08-03 MED ORDER — HALOPERIDOL LACTATE 5 MG/ML IJ SOLN: 0.5000 mg | INTRAMUSCULAR | Status: DC | PRN

## 2016-08-03 MED ORDER — LORAZEPAM 2 MG/ML IJ SOLN: 1.0000 mg | INTRAMUSCULAR | Status: DC | PRN

## 2016-08-03 NOTE — Care Management Important Message (Signed)
Important Message  Patient Details  Name: Bruce [name] MRN: [id number] Date of Birth: 03/26/1946   Medicare Important Message Given:  Yes  Signed IM notice given     Katrina Stack, RN 08/03/2016, 9:12 AM

## 2016-08-03 NOTE — Care Management (Addendum)
Anticipate discharge within next 24 hours if hgb stable.  Patient is declining blood transfusions and long term dialysis.  Patient requested DNR then when attending spoke with him he requested full code status.  Palliative consult is pending.  Will benefit from palliative follow up as an outpatient.  Psych declares that patient is competent and has capacity to make his own decisions. Patient has assigned dialysis clinic and chair time should patient decide to continue dialysis as out patient.

## 2016-08-03 NOTE — Progress Notes (Signed)
Initial Nutrition Assessment  DOCUMENTATION CODES:   Not applicable  INTERVENTION:  1. Patient did not want oral nutrition supplements at this time, await diet advancement per MD/NP/PA  NUTRITION DIAGNOSIS:   Inadequate oral intake related to acute illness as evidenced by other (see comment) (per observation)  GOAL:   Patient will meet greater than or equal to 90% of their needs  MONITOR:   PO intake, I & O's, Labs, Diet advancement, Weight trends  REASON FOR ASSESSMENT:   LOS    ASSESSMENT:   I am asked to see the patient by Dr. Candiss Norse from nephrology for placement of the dialysis catheter. Patient is 71 year old male who was admitted over a week ago for shortness of breath and productive cough.   Mr. Shepard is well known to our service. Previously saw patient multiple times to try and provide diet education - but he was confused. Patient was seen by palliative care yesterday. Has refused blood transfusions, GI workup and dialysis - wants to make his own decisions. Has been introduced to hospice/comfort measures and is considering. States to me that "I do not want to a lie in a bed all day." Concerned with quality of life. Currently on CLD - consuming 100%  Wants his diet to be advanced - but advised him he will not be advanced with possible GI bleed - he is ok with broths and liquids. Exhibits a 6# wt gain from previous admission, weight has fluctuated over course of admission from fluid accumulations but dry weight seems to be around 162-163#  States PTA he was eating well, had [place] before presenting to hospital. Had no appetite for approximately a week per patient but has returned to good appetite.   Will monitor for advancement and needs. He did not want ONS at this time.  Labs and medications reviewed: CBGs 139-224 Ca-Carbonate, Prednisone  Diet Order:  Diet clear liquid Room service appropriate? Yes; Fluid consistency: Thin  Skin:  Reviewed, no  issues  Last BM:  08/02/2016  Height:   Ht Readings from Last 1 Encounters:  07/21/16 5\' 8"  (1.727 m)    Weight:   Wt Readings from Last 1 Encounters:  08/03/16 169 lb 3.2 oz (76.7 kg)    Ideal Body Weight:  70 kg  BMI:  Body mass index is 25.73 kg/m.  Estimated Nutritional Needs:   Kcal:  1600-2000 calories  Protein:  77-92 gm  Fluid:  >/= 1.6L  EDUCATION NEEDS:   No education needs identified at this time  Satira Anis. Mannie Wineland, MS, RD LDN Inpatient Clinical Dietitian Pager [phone]

## 2016-08-03 NOTE — Progress Notes (Signed)
New referral for Palliative services to follow at [facility] care received from [place]. Referral made aware. Thank you. Flo Shanks RN, [facility] [place], hospital Liaison [phone] c

## 2016-08-03 NOTE — Plan of Care (Signed)
Problem: Cardiac: Goal: Ability to achieve and maintain adequate cardiopulmonary perfusion will improve Outcome: Not Progressing Echo last year showed E.F. of only 15 - 20%. Bruce [name] [facility]

## 2016-08-03 NOTE — Progress Notes (Signed)
Patient ID: Bruce [name], male   DOB: 08-19-1945, 71 y.o.   MRN: [id number]  [employer] PROGRESS NOTE  Bruce [name]:[id number] DOB: 1945/06/14 DOA: 07/21/2016 PCP: [name], MD  HPI/Subjective: Patient denies any other episodes of melena. Has received 1 unit of blood transfusion yesterday. Refusing further transfusions or dialysis. Refusing EGD or any other procedures  Objective: Vitals:   08/03/16 0415 08/03/16 1403  BP: (!) 106/48 (!) 108/46  Pulse: (!) 57 62  Resp: 19 16  Temp: 98.1 F (36.7 C) 97.7 F (36.5 C)    Filed Weights   08/01/16 0500 08/02/16 0449 08/03/16 0500  Weight: 77.1 kg (169 lb 15.6 oz) 77 kg (169 lb 11.2 oz) 76.7 kg (169 lb 3.2 oz)    ROS: Review of Systems  Constitutional: Negative for chills and fever.  Eyes: Negative for blurred vision.  Respiratory: Positive for cough and shortness of breath.   Cardiovascular: Negative for chest pain.  Gastrointestinal: Negative for abdominal pain, constipation, diarrhea, nausea and vomiting.  Genitourinary: Negative for dysuria.  Musculoskeletal: Negative for joint pain.  Neurological: Negative for dizziness and headaches.   Exam: Physical Exam  HENT:  Nose: No mucosal edema.  Mouth/Throat: No oropharyngeal exudate or posterior oropharyngeal edema.  Eyes: Conjunctivae, EOM and lids are normal. Pupils are equal, round, and reactive to light.  Neck: No JVD present. Carotid bruit is not present. No edema present. No thyroid mass and no thyromegaly present.  Cardiovascular: S1 normal and S2 normal.  Exam reveals no gallop.   No murmur heard. Pulses:      Dorsalis pedis pulses are 2+ on the right side, and 2+ on the left side.  Respiratory: No respiratory distress. He has decreased breath sounds in the right lower field. He has no wheezes. He has no rhonchi. He has no rales.  . Anterior chest wall with triple lumen catheter for hemodialysis  GI: Soft. Bowel sounds are normal. There is no  tenderness.  Musculoskeletal:       Right shoulder: He exhibits no swelling.  Lymphadenopathy:    He has no cervical adenopathy.  Neurological: He is alert. No cranial nerve deficit.  Skin: Skin is warm. No rash noted. Nails show no clubbing.  Psychiatric: He has a normal mood and affect.      Data Reviewed: Basic Metabolic Panel:  Recent Labs Lab 07/28/16 0540 07/29/16 0347 07/30/16 0540 07/31/16 0559 08/01/16 0641 08/02/16 0536 08/03/16 0514  NA 135 138 138 135 136 135 138  K 4.3 4.9 4.7 4.9 4.2 4.0 3.6  CL 96* 97* 98* 101 100* 102 104  CO2 25 22 26 23 28 26 29   GLUCOSE 186* 106* 231* 342* 310* 189* 162*  BUN 114* 132* 114* QUANTITY NOT SUFFICIENT, UNABLE TO PERFORM TEST 78* 85* 69*  CREATININE 5.10* 5.20* 4.43* 3.46* 2.76* 2.89* 2.77*  CALCIUM 5.5* 6.0* 6.2* 6.8* 7.1* 7.4* 7.2*  MG 2.6* 2.8*  --   --   --   --   --   PHOS  --  5.5*  --   --  2.4*  --   --    Liver Function Tests:  Recent Labs Lab 07/29/16 0347 08/01/16 0641  ALBUMIN 3.7 2.5*   CBC:  Recent Labs Lab 07/31/16 0745 07/31/16 0848 08/01/16 0641 08/02/16 0536 08/02/16 1044 08/02/16 1615 08/02/16 2218 08/03/16 0514  WBC 70.7* 70.4* 77.4* 84.0*  --   --   --  61.0*  HGB 7.8* 7.5* 7.4* 6.9* 6.4*  6.0* 7.7* 7.3*  HCT 26.2* 25.1* 24.6* 23.2* 21.7* 19.5* 24.5* 23.2*  MCV 94.0 92.1 89.6 90.5  --   --   --  88.6  PLT 508* 520* 373 372  --   --   --  277   BNP (last 3 results)  Recent Labs  04/04/16 1103 05/02/16 1115 07/21/16 0917  BNP 1,141.8* 2,219.0* 3,630.0*    CBG:  Recent Labs Lab 08/02/16 1154 08/02/16 1638 08/02/16 2102 08/03/16 0743 08/03/16 1150  GLUCAP 210* 234* 198* 139* 224*     Scheduled Meds: . amiodarone  400 mg Oral BID  . insulin aspart  0-5 Units Subcutaneous QHS  . insulin aspart  0-9 Units Subcutaneous TID WC  . metoprolol succinate  12.5 mg Oral Daily  . pantoprazole (PROTONIX) IV  40 mg Intravenous Q12H  . predniSONE  10 mg Oral Q breakfast  .  tiotropium  18 mcg Inhalation q morning - 10a   Continuous Infusions:   Assessment/Plan:    #Acute on chronic Anemia of chronic kidney disease with melena, chronic history of CML-received one unit of blood transfusion during dialysis 07/31/2016 and Received another unit of blood transfusion on 08/02/2016. Hemoglobin at 7.3 today. Patient is refusing more blood transfusion. Aspirin and heparin subcutaneous was discontinued. Seen by gastroenterology recommended EGD but patient is refusing any kind of procedures at this point. He denies any depression and looks competent to make decisions  .1. Acute kidney injury on chronic kidney disease with hyperkalemia and hyponatremia. Overdiuresis with cardiorenal syndrome as per nephrology. Had 3 episodes of dialysis until now,Refusing dialysis in future if needed 2. Copd exacerbation- tapered solumedrol to 10mg  from 4/21 3. Acute on chronic combined Congestive heart failure, NYHA class III. Been holding diuretics most of the hospital stay secondary to acute kidney injury. This was likely not acute congestive heart failure. BNP can be falsely elevated with people on entresto and kidney failure. Follow up with Dr. Candis Musa 4.  Arrhythmia on amiodarone as per cardiology- [facility];  ?NSVT-According to telemetry, EKG reveals tachycardia. Cardiology is recommending to continue amiodarone 400 mg by mouth twice a day for 2 weeks then 200 mg once daily 5.  Essential hypertension. Blood pressure is low. Amiodarone is on hold at this time 6.  CML on aspirin,Consult local oncology 7.  Hyperlipidemia unspecified on atorvastatin 8.  History of TIA and stroke on aspirin 9.  Impaired fasting glucose secondary to steroids on sliding scale Disposition Will discharge patient to rehabilitation if hemoglobin is stable and patient is Clinically stable tomorrow  Patient is competent to make medical decisions according to psychiatry. Follow up with palliative care CODE STATUS  changed  to DO NOT RESUSCITATE Code Status:     Code Status Orders        Start     Ordered   07/21/16 1207  dnr   Continuous     07/21/16 1206    Code Status History    Date Active Date Inactive Code Status Order ID Comments User Context   05/02/2016  6:59 PM 05/07/2016 10:52 PM Full Code [id number]  Idelle Crouch, MD Inpatient   03/21/2016  6:40 PM 03/23/2016  6:39 PM Full Code [id number]  Idelle Crouch, MD Inpatient   10/10/2015 11:59 PM 10/15/2015  7:08 PM Full Code [id number]  Ivor Costa, MD ED   06/04/2015  1:54 AM 06/06/2015  5:28 PM Full Code [id number]  [name], MD ED   01/25/2015  3:16 AM 01/29/2015  5:17  PM Full Code [id number]  Lavina Hamman, MD ED   09/13/2014  5:07 PM 09/22/2014  6:09 PM Full Code [id number]  Louellen Molder, MD Inpatient   03/20/2014 11:33 PM 03/21/2014  5:46 PM Full Code [id number]  Arne Cleveland, MD Inpatient   03/18/2014 11:46 PM 03/20/2014 11:33 PM Full Code [id number]  [name], MD Inpatient   03/05/2014  9:04 PM 03/06/2014  6:57 PM Full Code [id number]  [name], MD Inpatient     Disposition Plan: To be determined Clinical condition guarded. Refusing blood transfusion, EGD or any other procedures. He is aware that by refusing the recommended treatments he might die. Reports that he wants to go back to [employer]. Wishes to continue full CODE STATUS  Consultants:  Nephrology  Cardiology  Procedures: - Dialysis catheter  Time spent: 52minutes. RN Crystal was present during the event a discussion with the patient  Naiomi Musto, [employer]

## 2016-08-03 NOTE — Progress Notes (Signed)
The patient has refused a GI workup. Nothing further to do from a GI point of view. I will sign off.  Please call if any further GI concerns or questions.  We would like to thank you for the opportunity to participate in the care of Bruce [name].

## 2016-08-03 NOTE — Progress Notes (Signed)
[facility]  ROUNDING NOTE   Subjective:  Patient resting comfortably in bed. He is declined further gastroenterology workup. Renal function has improved his creatinine is down to 2.7.  Objective:  Vital signs in last 24 hours:  Temp:  [97.7 F (36.5 C)-98.1 F (36.7 C)] 97.7 F (36.5 C) (04/24 1403) Pulse Rate:  [57-62] 62 (04/24 1403) Resp:  [16-19] 16 (04/24 1403) BP: (96-108)/(42-51) 108/46 (04/24 1403) SpO2:  [94 %-100 %] 100 % (04/24 1403) Weight:  [76.7 kg (169 lb 3.2 oz)] 76.7 kg (169 lb 3.2 oz) (04/24 0500)  Weight change: -0.227 kg (-8 oz) Filed Weights   08/01/16 0500 08/02/16 0449 08/03/16 0500  Weight: 77.1 kg (169 lb 15.6 oz) 77 kg (169 lb 11.2 oz) 76.7 kg (169 lb 3.2 oz)    Intake/Output: I/O last 3 completed shifts: In: 1310 [P.O.:1000; Blood:310] Out: 2125 [Urine:2125]   Intake/Output this shift:  Total I/O In: 1020 [P.O.:1020] Out: 200 [Urine:200]  Physical Exam: General: NAD  Head: Normocephalic, atraumatic. Moist oral mucosal membranes  Eyes: Muddy sclera   Neck: Supple, trachea midline  Lungs:  Clear to auscultation bilaterally   Heart: Regular rate and rhythm  Abdomen:  Soft, nontender  Extremities: No peripheral edema.  Neurologic: Nonfocal, alert, oriented  Skin: No lesions  Access: Right IJ PC    Basic Metabolic Panel:  Recent Labs Lab 07/28/16 0540 07/29/16 0347 07/30/16 0540 07/31/16 0559 08/01/16 0641 08/02/16 0536 08/03/16 0514  NA 135 138 138 135 136 135 138  K 4.3 4.9 4.7 4.9 4.2 4.0 3.6  CL 96* 97* 98* 101 100* 102 104  CO2 25 22 26 23 28 26 29   GLUCOSE 186* 106* 231* 342* 310* 189* 162*  BUN 114* 132* 114* QUANTITY NOT SUFFICIENT, UNABLE TO PERFORM TEST 78* 85* 69*  CREATININE 5.10* 5.20* 4.43* 3.46* 2.76* 2.89* 2.77*  CALCIUM 5.5* 6.0* 6.2* 6.8* 7.1* 7.4* 7.2*  MG 2.6* 2.8*  --   --   --   --   --   PHOS  --  5.5*  --   --  2.4*  --   --     Liver Function Tests:  Recent Labs Lab 07/29/16 0347  08/01/16 0641  ALBUMIN 3.7 2.5*   No results for input(s): LIPASE, AMYLASE in the last 168 hours. No results for input(s): AMMONIA in the last 168 hours.  CBC:  Recent Labs Lab 07/31/16 0745 07/31/16 0848 08/01/16 0641 08/02/16 0536 08/02/16 1044 08/02/16 1615 08/02/16 2218 08/03/16 0514  WBC 70.7* 70.4* 77.4* 84.0*  --   --   --  61.0*  HGB 7.8* 7.5* 7.4* 6.9* 6.4* 6.0* 7.7* 7.3*  HCT 26.2* 25.1* 24.6* 23.2* 21.7* 19.5* 24.5* 23.2*  MCV 94.0 92.1 89.6 90.5  --   --   --  88.6  PLT 508* 520* 373 372  --   --   --  277    Cardiac Enzymes: No results for input(s): CKTOTAL, CKMB, CKMBINDEX, TROPONINI in the last 168 hours.  BNP: Invalid input(s): POCBNP  CBG:  Recent Labs Lab 08/02/16 1154 08/02/16 1638 08/02/16 2102 08/03/16 0743 08/03/16 1150  GLUCAP 210* 234* 198* 139* 224*    Microbiology: Results for orders placed or performed during the hospital encounter of 03/27/16  Culture, blood (Routine x 2)     Status: None   Collection Time: 03/27/16  5:35 PM  Result Value Ref Range Status   Specimen Description BLOOD LEFT ANTECUBITAL  Final   Special Requests  BOTTLES DRAWN AEROBIC AND ANAEROBIC 5CC  Final   Culture NO GROWTH 5 DAYS  Final   Report Status 04/01/2016 FINAL  Final  Culture, blood (Routine x 2)     Status: None   Collection Time: 03/27/16  5:55 PM  Result Value Ref Range Status   Specimen Description BLOOD RIGHT ANTECUBITAL  Final   Special Requests BOTTLES DRAWN AEROBIC AND ANAEROBIC 5CC  Final   Culture NO GROWTH 5 DAYS  Final   Report Status 04/01/2016 FINAL  Final  Urine culture     Status: Abnormal   Collection Time: 03/27/16  6:05 PM  Result Value Ref Range Status   Specimen Description URINE, RANDOM  Final   Special Requests NONE  Final   Culture <10,000 COLONIES/mL INSIGNIFICANT GROWTH (A)  Final   Report Status 03/29/2016 FINAL  Final    Coagulation Studies: No results for input(s): LABPROT, INR in the last 72  hours.  Urinalysis: No results for input(s): COLORURINE, LABSPEC, PHURINE, GLUCOSEU, HGBUR, BILIRUBINUR, KETONESUR, PROTEINUR, UROBILINOGEN, NITRITE, LEUKOCYTESUR in the last 72 hours.  Invalid input(s): APPERANCEUR    Imaging: No results found.   Medications:    . amiodarone  400 mg Oral BID  . insulin aspart  0-5 Units Subcutaneous QHS  . insulin aspart  0-9 Units Subcutaneous TID WC  . metoprolol succinate  12.5 mg Oral Daily  . pantoprazole (PROTONIX) IV  40 mg Intravenous Q12H  . predniSONE  10 mg Oral Q breakfast  . tiotropium  18 mcg Inhalation q morning - 10a   acetaminophen **OR** acetaminophen, albuterol, bisacodyl, diphenhydrAMINE, guaiFENesin-codeine, ipratropium-albuterol, meclizine, ondansetron **OR** ondansetron (ZOFRAN) IV, oxyCODONE-acetaminophen, senna-docusate  Assessment/ Plan:  [name] is a 71 y.o. black male with alcoholism, systolic congestive heart failure, CVA, hypertension, coronary artery disease, hyperlipidemia, who was admitted to [facility] on 07/21/2016  1. Acute renal failure  Acute renal failure secondary to acute cardiorenal syndrome  Baseline creatinine 1.32.  Jul 21, 2016 Serum creatinine  peaked at 5.87.     Permcath placed 4/19  Third dialysis treatment on 4/21 -  Creatinine slightly down to 2.7 with a BUN of 69 today.  Therefore no urgent indication for dialysis at the moment.  We will continue to monitor his renal parameters closely as an outpatient.  2. Acute exacerbation of chronic systolic congestive heart failure: echo 12/17 EF 15-20%., pulmonary Hypertension -  Appears to be well compensated at the moment.  We will need to monitor for signs of worsening shortness of breath closely however.  3. COPD acute exacerbation:  - management as per hospitalist.  4. Acidosis - significantly improved.  Serum bicarbonate currently 29.  5. Hypocalcemia -  Calcium slightly lower today at 7.2.  Continue calcitriol.  6. CML -  patient prefers to follow up at [facility] - WBC count down to 61.      LOS: 13 Becca Bayne 4/24/20183:30 PM

## 2016-08-03 NOTE — Progress Notes (Signed)
Physical Therapy Treatment Patient Details Name: Bruce [name] MRN: [id number] DOB: 1946/01/22 Today's Date: 08/03/2016    History of Present Illness 71 y.o. male with a known history of CAD, CHF, CK D, CML and cocaine abuse. The patient presently ED with worsening shortness of breath and productive cough for several months. He was admitted to [facility] in March and was thought to have a coronary vasospasm from cocaine use, CHF exacerbation and non-STEMI.    PT Comments    Pt agreeable to PT and eager to try walking. Pt sitting in chair, no complaints other than being cold. Pt requires Min A for sit to/from stand from chair and bed; mild increased difficulty from bed. Stand tolerance before attempted ambulation without adverse affects; mild dizziness with initial stand that subsides quickly. Pt has good awareness to rest after initial short ambulation (15 ft). Pt ambulates 25 ft for second walk and wishes to remain in chair; participates in seated and long sit exercises. Pt very talkative throughout session regarding illnesses/end of life and the unknown. Pt aware he has talked to "a lady" (palliative) that is helping him "figure things out". Pt does continue to express uncertainty; discussed with nursing and Dr. Margaretmary Eddy. After treating pt; update order was seen and will be acknowledged cancelling PT. Pt notes he does have a desire to walk as long as he can.   Follow Up Recommendations  SNF     Equipment Recommendations       Recommendations for Other Services       Precautions / Restrictions Precautions Precautions: Fall Restrictions Weight Bearing Restrictions: No    Mobility  Bed Mobility               General bed mobility comments: Not tested; up in chair  Transfers Overall transfer level: Needs assistance Equipment used: Rolling walker (2 wheeled) Transfers: Sit to/from Stand Sit to Stand: Min assist         General transfer comment: From chair and bed. Cues  for proper hand placement. Increased assist for stand to sit post second walk,   Ambulation/Gait Ambulation/Gait assistance: Min guard Ambulation Distance (Feet): 15 Feet (second walk 25 ft) Assistive device: Rolling walker (2 wheeled) Gait Pattern/deviations: Step-through pattern;Trunk flexed;Shuffle Gait velocity: slow   General Gait Details: slow steady. Rests when feels need. Happy to walk   Stairs            Wheelchair Mobility    Modified Rankin (Stroke Patients Only)       Balance Overall balance assessment: Needs assistance Sitting-balance support: Feet supported Sitting balance-Leahy Scale: Good     Standing balance support: Bilateral upper extremity supported Standing balance-Leahy Scale: Fair                              Cognition Arousal/Alertness: Awake/alert Behavior During Therapy: WFL for tasks assessed/performed Overall Cognitive Status: Within Functional Limits for tasks assessed                                        Exercises General Exercises - Lower Extremity Quad Sets: Strengthening;Both;15 reps Gluteal Sets: Strengthening;Both;15 reps Long Arc Quad: AROM;Both;10 reps;Seated Heel Slides: AROM;Both;10 reps Hip ABduction/ADduction: AROM;Both;10 reps Hip Flexion/Marching: AROM;Both;10 reps Toe Raises: AROM;Both;15 reps Heel Raises: AROM;Both;15 reps Other Exercises Other Exercises: stand tolerance 2x     General Comments  Pertinent Vitals/Pain Pain Assessment: No/denies pain    Home Living                      Prior Function            PT Goals (current goals can now be found in the care plan section) Progress towards PT goals: Progressing toward goals    Frequency    Min 2X/week      PT Plan Current plan remains appropriate    Co-evaluation             End of Session Equipment Utilized During Treatment: Gait belt Activity Tolerance: Patient tolerated treatment  well Patient left: in chair;with call bell/phone within reach;with chair alarm set   PT Visit Diagnosis: Muscle weakness (generalized) (M62.81);Difficulty in walking, not elsewhere classified (R26.2)     Time: 1950-9326 PT Time Calculation (min) (ACUTE ONLY): 45 min  Charges:  $Gait Training: 8-22 mins $Therapeutic Exercise: 8-22 mins $Therapeutic Activity: 8-22 mins                    G Codes:        Larae Grooms, PTA 08/03/2016, 4:20 PM

## 2016-08-03 NOTE — Clinical Social Work Note (Signed)
CSW met with patient to discuss going to SNF verse going to hospice facility.  Patient states he would like to go to [facility] to see how he does with walking and get some therapy.  Patient feels that he wants to stay at SNF as a long term care resident, CSW updated SNF who will review patient information once he arrives.  Patient states that if he is at the SNF and his health gets worse then he would like to go to hospice facility.  Patient stated he does not feel like he is ready to go to hospice facility now despite what he told palliative when they met with him earlier today.  CSW continuing to follow patient's progress throughout discharge planning.  Jones Broom. Norval Morton, MSW, [place]  08/03/2016 4:47 PM

## 2016-08-03 NOTE — Progress Notes (Signed)
                                                                                                                                                                                                         Daily Progress Note   Patient Name: Bruce [name]       Date: 08/03/2016 DOB: 03/23/1946  Age: 71 y.o. MRN#: [id number] Attending Physician: Aruna Gouru, MD Primary Care Physician: Harwani, Mohan, MD Admit Date: 07/21/2016  Reason for Consultation/Follow-up: Establishing goals of care and Psychosocial/spiritual support  Subjective:  Met with patient for extended amount of time today for GOC. When I enter the room he tells me, "I'm dying out." "I know I am, I'm dying out".   Discussed his illness trajectory with multiple medical problems that are confounding one another including CHF, acute GI bleed (with no further workup planned), CML, AKI, significant anemia refractory to transfusions (in the setting of CML, kidney injury, and GI bleed).   We talk about the options that are available to him to extend his life. He doesn't want to pursue these options.  He doesn't want anymore blood transfusions. He doesn't want to take medication for his CML. He doesn't want dialysis. He doesn't want workup for his GI bleeding.    He wants to eat. He wants to be released from the hospital to a place where he can be kept comfortable and clean and provided with food and warmth, and medication that will prevent any suffering while he dies.  He wants to try and get up and walk, but he knows he is too weak. He is upset that he got "stuck" sitting on the toilet earlier.  I presented option of residential hospice home and comfort care. He was agreeable. He verbalized understanding, "so that's the last place you go".   We discussed code status. He states he wants to "go out peaceful". "Don't bring me back, just let me go".   Social support: patient has a brother and sister he's been estranged from, but he  requests that I contact friend [name] (in contacts) and ask her to communicate with them.    Review of Systems  Constitutional: Positive for malaise/fatigue and weight loss.  Respiratory: Negative for cough.   Cardiovascular: Negative for chest pain and palpitations.  Gastrointestinal: Positive for constipation. Negative for abdominal pain.  Neurological: Positive for weakness.  Psychiatric/Behavioral: Positive for substance abuse. Negative for depression and suicidal ideas. The patient does not have insomnia.     Length of Stay: 13  Current Medications: Scheduled Meds:  .   amiodarone  400 mg Oral BID  . atorvastatin  40 mg Oral Daily  . calcitRIOL  0.25 mcg Oral Daily  . calcium carbonate  500 mg of elemental calcium Oral BID  . insulin aspart  0-5 Units Subcutaneous QHS  . insulin aspart  0-9 Units Subcutaneous TID WC  . metoprolol succinate  12.5 mg Oral Daily  . pantoprazole (PROTONIX) IV  40 mg Intravenous Q12H  . predniSONE  10 mg Oral Q breakfast  . sodium chloride flush  3 mL Intravenous Q12H  . sodium chloride flush  3 mL Intravenous Q12H  . tiotropium  18 mcg Inhalation q morning - 10a    Continuous Infusions: . sodium chloride    . sodium chloride    . sodium chloride      PRN Meds: sodium chloride, acetaminophen **OR** acetaminophen, albuterol, bisacodyl, diphenhydrAMINE, guaiFENesin-codeine, ipratropium-albuterol, meclizine, ondansetron **OR** ondansetron (ZOFRAN) IV, oxyCODONE-acetaminophen, senna-docusate, sodium chloride flush  Physical Exam  Constitutional: He is oriented to person, place, and time.  HENT:  Head: Normocephalic and atraumatic.  Cardiovascular: Normal rate and regular rhythm.   Pulmonary/Chest: Effort normal and breath sounds normal.  Musculoskeletal:  Weakness in lower extremities  Neurological: He is alert and oriented to person, place, and time.  Skin: Skin is warm and dry.  Psychiatric: He has a normal mood and affect. His behavior  is normal. Judgment and thought content normal.            Vital Signs: BP (!) 106/48 (BP Location: Right Arm)   Pulse (!) 57   Temp 98.1 F (36.7 C) (Oral)   Resp 19   Ht 5' 8" (1.727 m)   Wt 76.7 kg (169 lb 3.2 oz)   SpO2 97%   BMI 25.73 kg/m  SpO2: SpO2: 97 % O2 Device: O2 Device: Not Delivered O2 Flow Rate: O2 Flow Rate (L/min): 2 L/min  Intake/output summary:  Intake/Output Summary (Last 24 hours) at 08/03/16 1401 Last data filed at 08/03/16 1159  Gross per 24 hour  Intake             1610 ml  Output             1250 ml  Net              360 ml   LBM: Last BM Date: 08/02/16 Baseline Weight: Weight: 75.8 kg (167 lb) Most recent weight: Weight: 76.7 kg (169 lb 3.2 oz)       Palliative Assessment/Data: PPS: 30%    Flowsheet Rows     Most Recent Value  Intake Tab  Referral Department  Hospitalist  Unit at Time of Referral  Cardiac/Telemetry Unit  Palliative Care Primary Diagnosis  Nephrology  Date Notified  08/02/16  Palliative Care Type  New Palliative care  Reason for referral  Clarify Goals of Care  Date of Admission  07/21/16  # of days IP prior to Palliative referral  12  Clinical Assessment  Psychosocial & Spiritual Assessment  Palliative Care Outcomes      Patient Active Problem List   Diagnosis Date Noted  . Anemia   . Palliative care by specialist   . Goals of care, counseling/discussion   . Adjustment disorder with mixed anxiety and depressed mood 08/02/2016  . Melena   . Acute on chronic systolic CHF (congestive heart failure) (HCC) 05/07/2016  . Unsteady gait 05/07/2016  . Atypical chest pain 05/05/2016  . Cardiomyopathy 05/05/2016  . Atrial arrhythmia 05/05/2016  . Muscle weakness (generalized)   .   Thrombocytosis (HCC)   . Chest pain 05/02/2016  . Near syncope 05/02/2016  . Acute respiratory distress 03/22/2016  . Homeless 03/22/2016  . Slurred speech 03/22/2016  . History of stroke 03/22/2016  . Elevated lactic acid level  10/11/2015  . CHF (congestive heart failure) (HCC) 10/10/2015  . CKD (chronic kidney disease), stage II   . Dizziness 01/25/2015  . Acute on chronic combined systolic and diastolic CHF, NYHA class 3 (HCC) 01/25/2015  . Essential hypertension 01/25/2015  . Compliance poor 01/25/2015  . Coronary artery disease, non-occlusive   . H/O: stroke with residual effects 09/13/2014  . Transaminitis 09/13/2014  . Elevated troponin 09/13/2014  . TIA (transient ischemic attack) 05/10/2014  . CML (chronic myelocytic leukemia) (HCC) 03/27/2014  . Leukocytosis 03/18/2014    Palliative Care Assessment & Plan   Patient Profile: 70 y.o. male  with past medical history of CML (followed at [facility], but noncompliant with treatment), CHF, drug use, admitted on 07/21/2016 with SOB, CP, cough. Workup thus far reveals anemia, acute kidney injury (on dialysis), GI bleeding. Patient refusing further blood transfusion and workup for GI bleeding. Palliative medicine consulted for GOC.   Assessment/Recommendations/Plan    Full Comfort Care   Comfort feeding- advance diet as tolerated  DNR  Refer for residential Hospice  Contact patient's friend- [name] (in demographics) when discharged  Code Status:  DNR  Prognosis:  < 2 weeks patient is at risk for sudden death due to multiple medical problems that are confounding one another including CHF, acute GI bleed (with no further workup planned), CML, AKI (requiring dialysis, but pt declining further dialysis), significant anemia refractory to transfusions (in the setting of CML, kidney injury, and GI bleed, no further dialysis planned).     Discharge Planning:  Hospice facility  Care plan was discussed with patient.  Thank you for allowing the Palliative Medicine Team to assist in the care of this patient.   Time In: 1245 Time Out: 1405 Total Time 80 mins Prolonged Time Billed Yes      Greater than 50%  of this time was spent counseling and coordinating  care related to the above assessment and plan.   , AGNP-C Palliative Medicine   Please contact Palliative Medicine Team phone at [phone] for questions and concerns.        

## 2016-08-04 LAB — TYPE AND SCREEN
ABO/RH(D): O POS
Antibody Screen: NEGATIVE
UNIT DIVISION: 0
UNIT DIVISION: 0
UNIT DIVISION: 0
Unit division: 0

## 2016-08-04 LAB — BPAM RBC
BLOOD PRODUCT EXPIRATION DATE: 201805172359
Blood Product Expiration Date: 201805052359
Blood Product Expiration Date: 201805162359
Blood Product Expiration Date: 201805162359
ISSUE DATE / TIME: 201804211642
ISSUE DATE / TIME: 201804231646
UNIT TYPE AND RH: 5100
UNIT TYPE AND RH: 5100
Unit Type and Rh: 5100
Unit Type and Rh: 5100

## 2016-08-04 LAB — CBC
HCT: 23 % — ABNORMAL LOW (ref 40.0–52.0)
HEMOGLOBIN: 7.4 g/dL — AB (ref 13.0–18.0)
MCH: 28.6 pg (ref 26.0–34.0)
MCHC: 31.9 g/dL — AB (ref 32.0–36.0)
MCV: 89.5 fL (ref 80.0–100.0)
Platelets: 287 10*3/uL (ref 150–440)
RBC: 2.57 MIL/uL — AB (ref 4.40–5.90)
RDW: 18.7 % — ABNORMAL HIGH (ref 11.5–14.5)
WBC: 52.7 10*3/uL — AB (ref 3.8–10.6)

## 2016-08-04 LAB — GLUCOSE, CAPILLARY
GLUCOSE-CAPILLARY: 166 mg/dL — AB (ref 65–99)
GLUCOSE-CAPILLARY: 305 mg/dL — AB (ref 65–99)

## 2016-08-04 MED ORDER — HALOPERIDOL LACTATE 2 MG/ML PO CONC: 0.5000 mg | ORAL | 0 refills | Status: DC | PRN

## 2016-08-04 MED ORDER — MORPHINE SULFATE (CONCENTRATE) 10 MG/0.5ML PO SOLN: 5.0000 mg | ORAL | 0 refills | Status: DC | PRN

## 2016-08-04 MED ORDER — IPRATROPIUM-ALBUTEROL 0.5-2.5 (3) MG/3ML IN SOLN: 3.0000 mL | Freq: Four times a day (QID) | RESPIRATORY_TRACT | 0 refills | Status: DC | PRN

## 2016-08-04 MED ORDER — METOPROLOL SUCCINATE ER 25 MG PO TB24: 12.5000 mg | ORAL_TABLET | Freq: Every day | ORAL | Status: DC

## 2016-08-04 MED ORDER — ONDANSETRON HCL 4 MG PO TABS: 4.0000 mg | ORAL_TABLET | Freq: Four times a day (QID) | ORAL | 0 refills | Status: AC | PRN

## 2016-08-04 MED ORDER — LORAZEPAM 1 MG PO TABS: 1.0000 mg | ORAL_TABLET | ORAL | 0 refills | Status: DC | PRN

## 2016-08-04 MED ORDER — GLYCOPYRROLATE 0.2 MG/ML IJ SOLN: 0.2000 mg | INTRAMUSCULAR | 0 refills | Status: DC | PRN

## 2016-08-04 MED ORDER — SENNOSIDES-DOCUSATE SODIUM 8.6-50 MG PO TABS: 1.0000 | ORAL_TABLET | Freq: Every evening | ORAL | Status: AC | PRN

## 2016-08-04 NOTE — Progress Notes (Signed)
Patient presently resting in the bed at this time, denies any pain diet advance to reg diet patient tolerating. Rounded with MD, paln to discharge patient to SNF today.

## 2016-08-04 NOTE — Progress Notes (Signed)
Palliative progress note:  Patient sitting up on side of bed. Looks much more alert and less depressed today. Tells me he is so glad we had so many long discussions and that he feels so much better in his heart. He does not want to go to hospice.   He wants to go to rehab for a while, and may want to go to hospice if rehab "doesn't work out".   He says he knows he may live weeks or months, and he is ready to "die of natural causes". Gave him emotional support. Encouraged him to continue to communicate with palliative provider at rehab facility.  Mariana Kaufman, AGNP-C Palliative Medicine  Please call Palliative Medicine team phone with any questions [phone]. For individual providers please see AMION.  Time in: 1330 Time out: 1355 Total time: 25 minutes  Greater than 50% of time was spent in counseling and coordination of care.

## 2016-08-04 NOTE — Progress Notes (Signed)
Patient requested to speak with a Chaplain. When Chaplain arrived an EMS staff were in the room helping the patient to be transferred but patient wanted to talk to [place] before he was transferred. Patient shared with Chaplain about his faith and how people should love each other instead of harming each other. Patient spoke with Chaplain briefly and then he was transferred by two EMS staff who were there.   08/04/16 1500  Clinical Encounter Type  Visited With Patient  Visit Type Initial  Referral From Nurse  Consult/Referral To Chaplain  Spiritual Encounters  Spiritual Needs Prayer

## 2016-08-04 NOTE — Progress Notes (Signed)
Patient is being discharge to rehab facility, report called to receiving nurse, IV removed tele removed, patient discharge to SNF via EMS at this time.

## 2016-08-04 NOTE — Discharge Summary (Signed)
Bruce [name] at [place] NAME: Bruce [name]    MR#:  [id number]  DATE OF BIRTH:  June 03, 1945  DATE OF ADMISSION:  07/21/2016 ADMITTING PHYSICIAN: Demetrios Loll, MD  DATE OF DISCHARGE: 08/04/16  PRIMARY CARE PHYSICIAN: [name], MD    ADMISSION DIAGNOSIS:  Elevated troponin [R74.8] COPD exacerbation (HCC) [B35.3] Systolic congestive heart failure, unspecified congestive heart failure chronicity [I50.20]  DISCHARGE DIAGNOSIS:  Acute on chronic anemia Chronic lymphocytic leukemia Acute renal insufficiency needing hemodialysis COPD exacerbation Cardiac dysrhythmias DO NOT RESUSCITATE with comfort care  SECONDARY DIAGNOSIS:   Past Medical History:  Diagnosis Date  . Bell's palsy   . Chronic combined systolic and diastolic CHF, NYHA class 3 ([place])   . CKD (chronic kidney disease), stage II   . CML (chronic myelocytic leukemia) ([place])   . Coronary artery disease, non-occlusive   . Hypercholesterolemia   . Hypertension   . Leukemia ([place])   . Stroke ([place])    No residual limb weakness.  Walks with cane at baseline.   Marland Kitchen TIA (transient ischemic attack) 05/10/2014    HOSPITAL COURSE:  HPI Bruce [name]  is a 71 y.o. male with a known history of CAD, CHF, CK D, CML and cocaine abuse. The patient presently ED with worsening shortness of breath and productive cough for several months. He was admitted to [facility] in March and was thought to have a coronary vasospasm from cocaine use, CHF exacerbation and non-STEMI. He said he has been using Lasix but sometimes be some Lasix. He denies any drug abuse or alcohol abuse this time. He is currently homeless and living with friends. He feels dizzy and lightheaded in the restaurant this morning. Per ED physician, the patient has rales in bilateral lungs. He is treated with the Lasix IV in the ED. But the chest x-[name] report didn't show any pulmonary edema.  HOSPITAL COURSE   #Acute on chronic  Anemia of chronic kidney disease with melena, chronic history of CML-received one unit of blood transfusion during dialysis 07/31/2016 and Received another unit of blood transfusion on 08/02/2016. Hemoglobin at 7.3 today. Patient is refusing more blood transfusion. Aspirin and heparin subcutaneous was discontinued. Seen by gastroenterology recommended EGD but patient is refusing any kind of procedures at this point. He denies any depression and looks competent to make decisions  # Acute kidney injury on chronic kidney disease with hyperkalemia and hyponatremia. Overdiuresis with cardiorenal syndrome as per nephrology. Had 3 episodes of dialysis until now,Refusing dialysis in future if needed  2. Copd exacerbation- tapered solumedrol to prednisone 10mg  from 4/21. Patient is refusing most of the medications and now code status is changed to DO NOT RESUSCITATE comfort care  3. Acute on chronic combined Congestive heart failure, NYHA class III.   4.  Arrhythmia on amiodarone as per cardiology- [facility];   continue amiodarone  patient is now comfort care measures  5.  Essential hypertension.  6.  CML  7.  Hyperlipidemia unspecified on atorvastatin  8.  History of TIA and stroke on aspirin  9.  Impaired fasting glucose   Patient is refusing procedures and most of the medications. Palliative care consulted. Patient's CODE STATUS has been changed to DO NOT RESUSCITATE comfort care  DISCHARGE CONDITIONS:   fair  CONSULTS OBTAINED:  Treatment Team:  Katha Cabal, MD Lloyd Huger, MD Gonzella Lex, MD   PROCEDURES  Permanent dialysis catheter placement on the right anterior chest wall  DRUG  ALLERGIES:  No Known Allergies  DISCHARGE MEDICATIONS:   Current Discharge Medication List    START taking these medications   Details  glycopyrrolate (ROBINUL) 0.2 MG/ML injection Inject 1 mL (0.2 mg total) into the skin every 4 (four) hours as needed (excessive secretions). Qty: 50  mL, Refills: 0    haloperidol (HALDOL) 2 MG/ML solution Place 0.3 mLs (0.6 mg total) under the tongue every 4 (four) hours as needed for agitation (or delirium). Qty: 20 mL, Refills: 0    ipratropium-albuterol (DUONEB) 0.5-2.5 (3) MG/3ML SOLN Take 3 mLs by nebulization every 6 (six) hours as needed. Qty: 360 mL, Refills: 0    LORazepam (ATIVAN) 1 MG tablet Take 1 tablet (1 mg total) by mouth every 4 (four) hours as needed for anxiety. Qty: 30 tablet, Refills: 0    Morphine Sulfate (MORPHINE CONCENTRATE) 10 MG/0.5ML SOLN concentrated solution Place 0.25 mLs (5 mg total) under the tongue every 2 (two) hours as needed for moderate pain (or dyspnea). Qty: 100 mL, Refills: 0    ondansetron (ZOFRAN) 4 MG tablet Take 1 tablet (4 mg total) by mouth every 6 (six) hours as needed for nausea. Qty: 20 tablet, Refills: 0    senna-docusate (SENOKOT-S) 8.6-50 MG tablet Take 1 tablet by mouth at bedtime as needed for mild constipation.      CONTINUE these medications which have CHANGED   Details  metoprolol succinate (TOPROL-XL) 25 MG 24 hr tablet Take 0.5 tablets (12.5 mg total) by mouth daily.      CONTINUE these medications which have NOT CHANGED   Details  acetaminophen (TYLENOL) 325 MG tablet Take 2 tablets (650 mg total) by mouth every 4 (four) hours as needed for headache or mild pain. Qty: 60 tablet, Refills: 3    albuterol (PROVENTIL HFA;VENTOLIN HFA) 108 (90 BASE) MCG/ACT inhaler Inhale 2 puffs into the lungs every 4 (four) hours as needed for wheezing or shortness of breath. Qty: 1 Inhaler, Refills: 3    metFORMIN (GLUCOPHAGE) 500 MG tablet Take 500 mg by mouth 2 (two) times daily with a meal.    tiotropium (SPIRIVA) 18 MCG inhalation capsule Place 18 mcg into inhaler and inhale daily.    amiodarone (PACERONE) 200 MG tablet Take 1 tablet (200 mg total) by mouth daily. Qty: 30 tablet, Refills: 5      STOP taking these medications     aspirin 81 MG chewable tablet       atorvastatin (LIPITOR) 40 MG tablet      bosutinib (BOSULIF) 100 MG tablet      carvedilol (COREG) 6.25 MG tablet      clopidogrel (PLAVIX) 75 MG tablet      ENTRESTO 24-26 MG      furosemide (LASIX) 40 MG tablet      spironolactone (ALDACTONE) 25 MG tablet      meclizine (ANTIVERT) 25 MG tablet          DISCHARGE INSTRUCTIONS:   Follow-up with primary care physician at the facility Follow up with palliative care at the facility  DIET:  Regular diet  DISCHARGE CONDITION:  Fair  ACTIVITY:  Activity as tolerated per PT   OXYGEN:  Home Oxygen: No.   Oxygen Delivery: room air  DISCHARGE LOCATION:  nursing home   If you experience worsening of your admission symptoms, develop shortness of breath, life threatening emergency, suicidal or homicidal thoughts you must seek medical attention immediately by calling 911 or calling your MD immediately  if symptoms less severe.  You Must read complete instructions/literature along with all the possible adverse reactions/side effects for all the Medicines you take and that have been prescribed to you. Take any new Medicines after you have completely understood and accpet all the possible adverse reactions/side effects.   Please note  You were cared for by a hospitalist during your hospital stay. If you have any questions about your discharge medications or the care you received while you were in the hospital after you are discharged, you can call the unit and asked to speak with the hospitalist on call if the hospitalist that took care of you is not available. Once you are discharged, your primary care physician will handle any further medical issues. Please note that NO REFILLS for any discharge medications will be authorized once you are discharged, as it is imperative that you return to your primary care physician (or establish a relationship with a primary care physician if you do not have one) for your aftercare needs so that  they can reassess your need for medications and monitor your lab values.     Today  Chief Complaint  Patient presents with  . Shortness of Breath   Patient prefers comfort care measures, refusing procedures or blood transfusions. Refusing dialysis in future. Okay with the rehabilitation Center and palliative care follow-up at skilled nursing facility  ROS:  CONSTITUTIONAL: Denies fevers, chills. Denies any fatigue, weakness.  EYES: Denies blurry vision, double vision, eye pain. EARS, NOSE, THROAT: Denies tinnitus, ear pain, hearing loss. RESPIRATORY: Denies cough, wheeze, shortness of breath.  CARDIOVASCULAR: Denies chest pain, palpitations, edema.  GASTROINTESTINAL: Denies nausea, vomiting, diarrhea, abdominal pain. Denies bright red blood per rectum. GENITOURINARY: Denies dysuria, hematuria. ENDOCRINE: Denies nocturia or thyroid problems. HEMATOLOGIC AND LYMPHATIC: Denies easy bruising or bleeding. SKIN: Denies rash or lesion. MUSCULOSKELETAL: Denies pain in neck, back, shoulder, knees, hips or arthritic symptoms.  NEUROLOGIC: Denies paralysis, paresthesias.  PSYCHIATRIC: Denies anxiety or depressive symptoms.   VITAL SIGNS:  Blood pressure (!) 105/47, pulse 64, temperature 97.8 F (36.6 C), temperature source Oral, resp. rate 18, height 5\' 8"  (1.727 m), weight 77.6 kg (171 lb 1.6 oz), SpO2 98 %.  I/O:    Intake/Output Summary (Last 24 hours) at 08/04/16 1136 Last data filed at 08/04/16 1111  Gross per 24 hour  Intake              600 ml  Output             1600 ml  Net            -1000 ml    PHYSICAL EXAMINATION:  GENERAL:  71 y.o.-year-old patient lying in the bed with no acute distress.  EYES: Pupils equal, round, reactive to light and accommodation. No scleral icterus. Extraocular muscles intact.  HEENT: Head atraumatic, normocephalic. Oropharynx and nasopharynx clear.  NECK:  Supple, no jugular venous distention. No thyroid enlargement, no tenderness.  LUNGS:  Normal breath sounds bilaterally, no wheezing, rales,rhonchi or crepitation. No use of accessory muscles of respiration.  CARDIOVASCULAR: S1, S2 normal. No murmurs, rubs, or gallops.  ABDOMEN: Soft, non-tender, non-distended. Bowel sounds present. No organomegaly or mass.  EXTREMITIES: No pedal edema, cyanosis, or clubbing.  NEUROLOGIC: Cranial nerves II through XII are intact. Muscle strength 5/5 in all extremities. Sensation intact. Gait not checked.  PSYCHIATRIC: The patient is alert and oriented x 3.  SKIN: No obvious rash, lesion, or ulcer.   DATA REVIEW:   CBC  Recent Labs Lab 08/04/16 0530  WBC 52.7*  HGB 7.4*  HCT 23.0*  PLT 287    Chemistries   Recent Labs Lab 07/29/16 0347  08/03/16 0514  NA 138  < > 138  K 4.9  < > 3.6  CL 97*  < > 104  CO2 22  < > 29  GLUCOSE 106*  < > 162*  BUN 132*  < > 69*  CREATININE 5.20*  < > 2.77*  CALCIUM 6.0*  < > 7.2*  MG 2.8*  --   --   < > = values in this interval not displayed.  Cardiac Enzymes No results for input(s): TROPONINI in the last 168 hours.  Microbiology Results  Results for orders placed or performed during the hospital encounter of 03/27/16  Culture, blood (Routine x 2)     Status: None   Collection Time: 03/27/16  5:35 PM  Result Value Ref Range Status   Specimen Description BLOOD LEFT ANTECUBITAL  Final   Special Requests BOTTLES DRAWN AEROBIC AND ANAEROBIC 5CC  Final   Culture NO GROWTH 5 DAYS  Final   Report Status 04/01/2016 FINAL  Final  Culture, blood (Routine x 2)     Status: None   Collection Time: 03/27/16  5:55 PM  Result Value Ref Range Status   Specimen Description BLOOD RIGHT ANTECUBITAL  Final   Special Requests BOTTLES DRAWN AEROBIC AND ANAEROBIC 5CC  Final   Culture NO GROWTH 5 DAYS  Final   Report Status 04/01/2016 FINAL  Final  Urine culture     Status: Abnormal   Collection Time: 03/27/16  6:05 PM  Result Value Ref Range Status   Specimen Description URINE, RANDOM  Final   Special  Requests NONE  Final   Culture <10,000 COLONIES/mL INSIGNIFICANT GROWTH (A)  Final   Report Status 03/29/2016 FINAL  Final    RADIOLOGY:  No results found.  EKG:   Orders placed or performed during the hospital encounter of 07/21/16  . EKG 12-Lead  . EKG 12-Lead  . EKG 12-Lead  . EKG 12-Lead      Management plans discussed with the patient, family and they are in agreement.  CODE STATUS:     Code Status Orders        Start     Ordered   08/03/16 1629  Do not attempt resuscitation (DNR)  Continuous    Question Answer Comment  In the event of cardiac or respiratory ARREST Do not call a "code blue"   In the event of cardiac or respiratory ARREST Do not perform Intubation, CPR, defibrillation or ACLS   In the event of cardiac or respiratory ARREST Use medication by any route, position, wound care, and other measures to relive pain and suffering. May use oxygen, suction and manual treatment of airway obstruction as needed for comfort.      08/03/16 1629    Code Status History    Date Active Date Inactive Code Status Order ID Comments User Context   08/03/2016  2:05 PM 08/03/2016  4:29 PM DNR [id number]  Earlie Counts, NP Inpatient   07/21/2016 12:07 PM 08/03/2016  2:05 PM Full Code [id number]  Demetrios Loll, MD Inpatient   05/02/2016  6:59 PM 05/07/2016 10:52 PM Full Code [id number]  Idelle Crouch, MD Inpatient   03/21/2016  6:40 PM 03/23/2016  6:39 PM Full Code [id number]  Idelle Crouch, MD Inpatient   10/10/2015 11:59 PM 10/15/2015  7:08 PM Full Code [id number]  Ivor Costa, MD ED  06/04/2015  1:54 AM 06/06/2015  5:28 PM Full Code [id number]  [name], MD ED   01/25/2015  3:16 AM 01/29/2015  5:17 PM Full Code [id number]  Lavina Hamman, MD ED   09/13/2014  5:07 PM 09/22/2014  6:09 PM Full Code [id number]  Louellen Molder, MD Inpatient   03/20/2014 11:33 PM 03/21/2014  5:46 PM Full Code [id number]  Arne Cleveland, MD Inpatient   03/18/2014 11:46 PM 03/20/2014 11:33 PM Full Code [id number]   [name], MD Inpatient   03/05/2014  9:04 PM 03/06/2014  6:57 PM Full Code [id number]  [name], MD Inpatient      TOTAL TIME TAKING CARE OF THIS PATIENT: 45 minutes.   Note: This dictation was prepared with Dragon dictation along with smaller phrase technology. Any transcriptional errors that result from this process are unintentional.   @[facility] @  on 08/04/2016 at 11:36 AM  Between 7am to 6pm - Pager - [phone]  After 6pm go to [email or web address] - password [email or web address] [facility]  [place] [facility]  Office  [phone]  CC: Primary care physician; [name], MD

## 2016-08-04 NOTE — Care Management Note (Signed)
Case Management Note  Patient Details  Name: Bruce [name] MRN: [id number] Date of Birth: Aug 20, 1945  Subjective/Objective:         Further dialysis will be determined at a later time.           Action/Plan: Discharged to [facility] with palliative care  Expected Discharge Date:  08/04/16               Expected Discharge Plan:     In-House Referral:     Discharge planning Services     Post Acute Care Choice:    Choice offered to:     DME Arranged:    DME Agency:     HH Arranged:    HH Agency:     Status of Service:     If discussed at [employer], dates discussed:    Additional Comments:  Katrina Stack, RN 08/04/2016, 5:58 PM

## 2016-08-04 NOTE — Progress Notes (Signed)
Inpatient Diabetes Program Recommendations  AACE/ADA: New Consensus Statement on Inpatient Glycemic Control (2015)  Target Ranges:  Prepandial:   less than 140 mg/dL      Peak postprandial:   less than 180 mg/dL (1-2 hours)      Critically ill patients:  140 - 180 mg/dL   Results for Bruce [name], Bruce [name] (MRN [id number]) as of 08/04/2016 11:32  Ref. Range 08/03/2016 07:43 08/03/2016 11:50 08/03/2016 17:00 08/03/2016 20:43 08/04/2016 07:38  Glucose-Capillary Latest Ref Range: 65 - 99 mg/dL 139 (H) 224 (H) 357 (H) 333 (H) 166 (H)   Review of Glycemic Control  Current orders for Inpatient glycemic control: Novolog 0-9 units TID with meals, Novolog 0-5 units QHS  Inpatient Diabetes Program Recommendations: Insulin - Meal Coverage: If patient remains inpatient, please consider ordering Novolog 3 units TID with meals for meal coverage if patient eats at least 50% of meals. Diet: If appropriate, please consider discontinuing Regular diet and ordering Carb Modified diet.  Thanks, Barnie Alderman, RN, MSN, CDE Diabetes Coordinator Inpatient Diabetes Program [phone] (Team Pager from 8am to 5pm)

## 2016-08-10 ENCOUNTER — Ambulatory Visit: Payer: Medicare Other | Admitting: Family

## 2016-08-18 ENCOUNTER — Encounter: Payer: Self-pay | Admitting: *Deleted

## 2016-08-18 ENCOUNTER — Inpatient Hospital Stay
Admission: EM | Admit: 2016-08-18 | Discharge: 2016-08-24 | DRG: 291 | Disposition: A | Discharge status: Skilled Nursing Facility | Payer: Medicare Other | Attending: Internal Medicine | Admitting: Internal Medicine

## 2016-08-18 ENCOUNTER — Inpatient Hospital Stay: Payer: Medicare Other

## 2016-08-18 ENCOUNTER — Emergency Department: Payer: Medicare Other

## 2016-08-18 DIAGNOSIS — R4182 Altered mental status, unspecified: Secondary | ICD-10-CM

## 2016-08-18 DIAGNOSIS — R748 Abnormal levels of other serum enzymes: Secondary | ICD-10-CM

## 2016-08-18 DIAGNOSIS — I5042 Chronic combined systolic (congestive) and diastolic (congestive) heart failure: Secondary | ICD-10-CM | POA: Diagnosis present

## 2016-08-18 DIAGNOSIS — Z8249 Family history of ischemic heart disease and other diseases of the circulatory system: Secondary | ICD-10-CM

## 2016-08-18 DIAGNOSIS — Z7189 Other specified counseling: Secondary | ICD-10-CM | POA: Diagnosis not present

## 2016-08-18 DIAGNOSIS — Z96641 Presence of right artificial hip joint: Secondary | ICD-10-CM | POA: Diagnosis present

## 2016-08-18 DIAGNOSIS — N186 End stage renal disease: Secondary | ICD-10-CM | POA: Diagnosis present

## 2016-08-18 DIAGNOSIS — G9341 Metabolic encephalopathy: Secondary | ICD-10-CM | POA: Diagnosis present

## 2016-08-18 DIAGNOSIS — F1020 Alcohol dependence, uncomplicated: Secondary | ICD-10-CM | POA: Diagnosis present

## 2016-08-18 DIAGNOSIS — N184 Chronic kidney disease, stage 4 (severe): Secondary | ICD-10-CM | POA: Diagnosis not present

## 2016-08-18 DIAGNOSIS — Z992 Dependence on renal dialysis: Secondary | ICD-10-CM

## 2016-08-18 DIAGNOSIS — E875 Hyperkalemia: Secondary | ICD-10-CM | POA: Diagnosis present

## 2016-08-18 DIAGNOSIS — R945 Abnormal results of liver function studies: Secondary | ICD-10-CM

## 2016-08-18 DIAGNOSIS — J9621 Acute and chronic respiratory failure with hypoxia: Secondary | ICD-10-CM | POA: Diagnosis present

## 2016-08-18 DIAGNOSIS — Z8673 Personal history of transient ischemic attack (TIA), and cerebral infarction without residual deficits: Secondary | ICD-10-CM

## 2016-08-18 DIAGNOSIS — I2510 Atherosclerotic heart disease of native coronary artery without angina pectoris: Secondary | ICD-10-CM | POA: Diagnosis present

## 2016-08-18 DIAGNOSIS — D631 Anemia in chronic kidney disease: Secondary | ICD-10-CM | POA: Diagnosis present

## 2016-08-18 DIAGNOSIS — Z66 Do not resuscitate: Secondary | ICD-10-CM | POA: Diagnosis present

## 2016-08-18 DIAGNOSIS — E7800 Pure hypercholesterolemia, unspecified: Secondary | ICD-10-CM | POA: Diagnosis present

## 2016-08-18 DIAGNOSIS — Z833 Family history of diabetes mellitus: Secondary | ICD-10-CM

## 2016-08-18 DIAGNOSIS — I132 Hypertensive heart and chronic kidney disease with heart failure and with stage 5 chronic kidney disease, or end stage renal disease: Secondary | ICD-10-CM | POA: Diagnosis present

## 2016-08-18 DIAGNOSIS — R791 Abnormal coagulation profile: Secondary | ICD-10-CM

## 2016-08-18 DIAGNOSIS — Z515 Encounter for palliative care: Secondary | ICD-10-CM | POA: Diagnosis not present

## 2016-08-18 DIAGNOSIS — G9340 Encephalopathy, unspecified: Secondary | ICD-10-CM | POA: Diagnosis not present

## 2016-08-18 DIAGNOSIS — R778 Other specified abnormalities of plasma proteins: Secondary | ICD-10-CM

## 2016-08-18 DIAGNOSIS — E785 Hyperlipidemia, unspecified: Secondary | ICD-10-CM | POA: Diagnosis present

## 2016-08-18 DIAGNOSIS — Z79899 Other long term (current) drug therapy: Secondary | ICD-10-CM

## 2016-08-18 DIAGNOSIS — E872 Acidosis: Secondary | ICD-10-CM | POA: Diagnosis not present

## 2016-08-18 DIAGNOSIS — Z856 Personal history of leukemia: Secondary | ICD-10-CM

## 2016-08-18 DIAGNOSIS — I429 Cardiomyopathy, unspecified: Secondary | ICD-10-CM | POA: Diagnosis present

## 2016-08-18 DIAGNOSIS — K7290 Hepatic failure, unspecified without coma: Secondary | ICD-10-CM

## 2016-08-18 DIAGNOSIS — K7200 Acute and subacute hepatic failure without coma: Secondary | ICD-10-CM | POA: Diagnosis present

## 2016-08-18 DIAGNOSIS — Z87891 Personal history of nicotine dependence: Secondary | ICD-10-CM

## 2016-08-18 DIAGNOSIS — R7989 Other specified abnormal findings of blood chemistry: Secondary | ICD-10-CM

## 2016-08-18 DIAGNOSIS — E162 Hypoglycemia, unspecified: Secondary | ICD-10-CM | POA: Diagnosis present

## 2016-08-18 DIAGNOSIS — N179 Acute kidney failure, unspecified: Secondary | ICD-10-CM | POA: Diagnosis present

## 2016-08-18 DIAGNOSIS — F4323 Adjustment disorder with mixed anxiety and depressed mood: Secondary | ICD-10-CM | POA: Diagnosis present

## 2016-08-18 LAB — URINALYSIS, COMPLETE (UACMP) WITH MICROSCOPIC
BACTERIA UA: NONE SEEN
BILIRUBIN URINE: NEGATIVE
Glucose, UA: NEGATIVE mg/dL
Ketones, ur: 5 mg/dL — AB
LEUKOCYTES UA: NEGATIVE
Nitrite: NEGATIVE
Protein, ur: 100 mg/dL — AB
SPECIFIC GRAVITY, URINE: 1.018 (ref 1.005–1.030)
pH: 5 (ref 5.0–8.0)

## 2016-08-18 LAB — CBC WITH DIFFERENTIAL/PLATELET
BASOS PCT: 1 %
Basophils Absolute: 0.3 10*3/uL — ABNORMAL HIGH (ref 0–0.1)
Eosinophils Absolute: 0 10*3/uL (ref 0–0.7)
Eosinophils Relative: 0 %
HEMATOCRIT: 24.3 % — AB (ref 40.0–52.0)
Hemoglobin: 6.9 g/dL — ABNORMAL LOW (ref 13.0–18.0)
LYMPHS PCT: 3 %
Lymphs Abs: 0.8 10*3/uL — ABNORMAL LOW (ref 1.0–3.6)
MCH: 29.9 pg (ref 26.0–34.0)
MCHC: 28.3 g/dL — AB (ref 32.0–36.0)
MCV: 105.7 fL — AB (ref 80.0–100.0)
MONOS PCT: 9 %
Monocytes Absolute: 2.5 10*3/uL — ABNORMAL HIGH (ref 0.2–1.0)
NEUTROS ABS: 24.1 10*3/uL — AB (ref 1.4–6.5)
Neutrophils Relative %: 87 %
Platelets: 852 10*3/uL — ABNORMAL HIGH (ref 150–440)
RBC: 2.3 MIL/uL — ABNORMAL LOW (ref 4.40–5.90)
RDW: 22.3 % — AB (ref 11.5–14.5)
WBC: 27.7 10*3/uL — ABNORMAL HIGH (ref 3.8–10.6)

## 2016-08-18 LAB — COMPREHENSIVE METABOLIC PANEL
ALT: 296 U/L — ABNORMAL HIGH (ref 17–63)
AST: 629 U/L — ABNORMAL HIGH (ref 15–41)
Albumin: 3.1 g/dL — ABNORMAL LOW (ref 3.5–5.0)
Alkaline Phosphatase: 273 U/L — ABNORMAL HIGH (ref 38–126)
BUN: 53 mg/dL — ABNORMAL HIGH (ref 6–20)
CHLORIDE: 105 mmol/L (ref 101–111)
Calcium: 8.3 mg/dL — ABNORMAL LOW (ref 8.9–10.3)
Creatinine, Ser: 3.63 mg/dL — ABNORMAL HIGH (ref 0.61–1.24)
GFR, EST AFRICAN AMERICAN: 18 mL/min — AB (ref >60)
GFR, EST NON AFRICAN AMERICAN: 16 mL/min — AB (ref >60)
Glucose, Bld: 121 mg/dL — ABNORMAL HIGH (ref 65–99)
POTASSIUM: 7.2 mmol/L — AB (ref 3.5–5.1)
SODIUM: 135 mmol/L (ref 135–145)
Total Bilirubin: 3.3 mg/dL — ABNORMAL HIGH (ref 0.3–1.2)
Total Protein: 6.6 g/dL (ref 6.5–8.1)

## 2016-08-18 LAB — MAGNESIUM: Magnesium: 2.2 mg/dL (ref 1.7–2.4)

## 2016-08-18 LAB — HEMOGLOBIN: Hemoglobin: 6.5 g/dL — ABNORMAL LOW (ref 13.0–18.0)

## 2016-08-18 LAB — URINE DRUG SCREEN, QUALITATIVE (ARMC ONLY)
AMPHETAMINES, UR SCREEN: NOT DETECTED
Barbiturates, Ur Screen: NOT DETECTED
Benzodiazepine, Ur Scrn: POSITIVE — AB
CANNABINOID 50 NG, UR ~~LOC~~: NOT DETECTED
Cocaine Metabolite,Ur ~~LOC~~: NOT DETECTED
MDMA (Ecstasy)Ur Screen: NOT DETECTED
Methadone Scn, Ur: NOT DETECTED
Opiate, Ur Screen: POSITIVE — AB
PHENCYCLIDINE (PCP) UR S: NOT DETECTED
Tricyclic, Ur Screen: NOT DETECTED

## 2016-08-18 LAB — TROPONIN I
TROPONIN I: 0.06 ng/mL — AB (ref <0.03)
Troponin I: 0.09 ng/mL (ref <0.03)
Troponin I: 0.19 ng/mL (ref <0.03)

## 2016-08-18 LAB — AMMONIA: AMMONIA: 81 umol/L — AB (ref 9–35)

## 2016-08-18 LAB — LACTIC ACID, PLASMA
LACTIC ACID, VENOUS: 8.6 mmol/L — AB (ref 0.5–1.9)
LACTIC ACID, VENOUS: 5.5 mmol/L — AB (ref 0.5–1.9)
Lactic Acid, Venous: 17.4 mmol/L (ref 0.5–1.9)
Lactic Acid, Venous: 15.9 mmol/L (ref 0.5–1.9)

## 2016-08-18 LAB — ETHANOL: Alcohol, Ethyl (B): <5 mg/dL (ref <5)

## 2016-08-18 LAB — SALICYLATE LEVEL: Salicylate Lvl: <7 mg/dL (ref 2.8–30.0)

## 2016-08-18 LAB — IRON AND TIBC
Iron: 14 ug/dL — ABNORMAL LOW (ref 45–182)
Saturation Ratios: 4 % — ABNORMAL LOW (ref 17.9–39.5)
TIBC: 365 ug/dL (ref 250–450)
UIBC: 351 ug/dL

## 2016-08-18 LAB — PHOSPHORUS: PHOSPHORUS: 7.4 mg/dL — AB (ref 2.5–4.6)

## 2016-08-18 LAB — PREPARE RBC (CROSSMATCH)

## 2016-08-18 LAB — PROCALCITONIN: Procalcitonin: 7.93 ng/mL

## 2016-08-18 LAB — GLUCOSE, CAPILLARY
GLUCOSE-CAPILLARY: 90 mg/dL (ref 65–99)
GLUCOSE-CAPILLARY: 170 mg/dL — AB (ref 65–99)
Glucose-Capillary: 124 mg/dL — ABNORMAL HIGH (ref 65–99)
Glucose-Capillary: 135 mg/dL — ABNORMAL HIGH (ref 65–99)

## 2016-08-18 LAB — POTASSIUM
Potassium: 6.2 mmol/L — ABNORMAL HIGH (ref 3.5–5.1)
Potassium: 5 mmol/L (ref 3.5–5.1)

## 2016-08-18 LAB — MRSA PCR SCREENING: MRSA BY PCR: NEGATIVE

## 2016-08-18 LAB — BRAIN NATRIURETIC PEPTIDE

## 2016-08-18 LAB — ACETAMINOPHEN LEVEL: Acetaminophen (Tylenol), Serum: <10 ug/mL — ABNORMAL LOW (ref 10–30)

## 2016-08-18 LAB — TRANSFERRIN: Transferrin: 259 mg/dL (ref 180–329)

## 2016-08-18 LAB — TSH: TSH: 2.923 u[IU]/mL (ref 0.350–4.500)

## 2016-08-18 LAB — FERRITIN: Ferritin: 1629 ng/mL — ABNORMAL HIGH (ref 24–336)

## 2016-08-18 LAB — PROTIME-INR
INR: 4.17
Prothrombin Time: 41.4 seconds — ABNORMAL HIGH (ref 11.4–15.2)

## 2016-08-18 LAB — LIPASE, BLOOD: LIPASE: 27 U/L (ref 11–51)

## 2016-08-18 MED ORDER — ALTEPLASE 2 MG IJ SOLR
4.0000 mg | INTRAMUSCULAR | Status: AC
  Administered 2016-08-18: 4 mg
  Filled 2016-08-18: qty 4

## 2016-08-18 MED ORDER — SODIUM CHLORIDE 0.9 % IV BOLUS (SEPSIS)
500.0000 mL | Freq: Once | INTRAVENOUS | Status: AC
Start: 2016-08-18 — End: 2016-08-18
  Administered 2016-08-18: 500 mL via INTRAVENOUS

## 2016-08-18 MED ORDER — MORPHINE SULFATE (PF) 4 MG/ML IV SOLN
4.0000 mg | Freq: Once | INTRAVENOUS | Status: AC
  Administered 2016-08-18: 4 mg via INTRAVENOUS
  Filled 2016-08-18: qty 1

## 2016-08-18 MED ORDER — VANCOMYCIN HCL IN DEXTROSE 1-5 GM/200ML-% IV SOLN
1000.0000 mg | Freq: Once | INTRAVENOUS | Status: AC
Start: 2016-08-18 — End: 2016-08-18
  Administered 2016-08-18: 1000 mg via INTRAVENOUS
  Filled 2016-08-18: qty 200

## 2016-08-18 MED ORDER — DEXTROSE 50 % IV SOLN
1.0000 | Freq: Once | INTRAVENOUS | Status: AC
  Administered 2016-08-18: 50 mL via INTRAVENOUS
  Filled 2016-08-18: qty 50

## 2016-08-18 MED ORDER — SODIUM CHLORIDE 0.9 % IV SOLN
Freq: Once | INTRAVENOUS | Status: AC
  Administered 2016-08-18: 14:00:00 via INTRAVENOUS

## 2016-08-18 MED ORDER — TIOTROPIUM BROMIDE MONOHYDRATE 18 MCG IN CAPS
18.0000 ug | ORAL_CAPSULE | Freq: Every morning | RESPIRATORY_TRACT | Status: DC
  Administered 2016-08-18 – 2016-08-24 (×7): 18 ug via RESPIRATORY_TRACT
  Filled 2016-08-18 (×2): qty 5

## 2016-08-18 MED ORDER — ONDANSETRON HCL 4 MG/2ML IJ SOLN: 4.0000 mg | Freq: Four times a day (QID) | INTRAMUSCULAR | Status: DC | PRN

## 2016-08-18 MED ORDER — IPRATROPIUM-ALBUTEROL 0.5-2.5 (3) MG/3ML IN SOLN: 3.0000 mL | Freq: Four times a day (QID) | RESPIRATORY_TRACT | Status: DC | PRN

## 2016-08-18 MED ORDER — CALCIUM GLUCONATE 10 % IV SOLN
INTRAVENOUS | Status: AC
  Administered 2016-08-18: 1000 mg
  Filled 2016-08-18: qty 10

## 2016-08-18 MED ORDER — SODIUM CHLORIDE 0.9 % IV BOLUS (SEPSIS)
1000.0000 mL | Freq: Once | INTRAVENOUS | Status: AC
  Administered 2016-08-18: 1000 mL via INTRAVENOUS

## 2016-08-18 MED ORDER — PIPERACILLIN-TAZOBACTAM 3.375 G IVPB 30 MIN
3.3750 g | Freq: Once | INTRAVENOUS | Status: AC
  Administered 2016-08-18: 3.375 g via INTRAVENOUS
  Filled 2016-08-18: qty 50

## 2016-08-18 MED ORDER — SODIUM BICARBONATE 8.4 % IV SOLN
INTRAVENOUS | Status: AC
  Administered 2016-08-18: 50 meq via INTRAVENOUS
  Filled 2016-08-18: qty 50

## 2016-08-18 MED ORDER — ACETAMINOPHEN 325 MG PO TABS
650.0000 mg | ORAL_TABLET | Freq: Four times a day (QID) | ORAL | Status: DC | PRN
  Administered 2016-08-19 – 2016-08-23 (×6): 650 mg via ORAL
  Filled 2016-08-18 (×4): qty 2

## 2016-08-18 MED ORDER — SODIUM CHLORIDE 0.9 % IV BOLUS (SEPSIS): 1000.0000 mL | INTRAVENOUS | Status: DC

## 2016-08-18 MED ORDER — VITAMIN K1 10 MG/ML IJ SOLN
10.0000 mg | Freq: Every day | INTRAMUSCULAR | Status: AC
  Administered 2016-08-18 – 2016-08-20 (×3): 10 mg via SUBCUTANEOUS
  Filled 2016-08-18 (×4): qty 1

## 2016-08-18 MED ORDER — VANCOMYCIN HCL IN DEXTROSE 1-5 GM/200ML-% IV SOLN
1000.0000 mg | Freq: Once | INTRAVENOUS | Status: AC
  Administered 2016-08-18: 1000 mg via INTRAVENOUS
  Filled 2016-08-18 (×2): qty 200

## 2016-08-18 MED ORDER — SODIUM BICARBONATE 8.4 % IV SOLN
50.0000 meq | Freq: Once | INTRAVENOUS | Status: AC
  Administered 2016-08-18: 50 meq via INTRAVENOUS

## 2016-08-18 MED ORDER — ONDANSETRON HCL 4 MG PO TABS: 4.0000 mg | ORAL_TABLET | Freq: Four times a day (QID) | ORAL | Status: DC | PRN

## 2016-08-18 MED ORDER — SENNOSIDES-DOCUSATE SODIUM 8.6-50 MG PO TABS: 1.0000 | ORAL_TABLET | Freq: Every evening | ORAL | Status: DC | PRN

## 2016-08-18 MED ORDER — DEXTROSE 50 % IV SOLN
1.0000 | Freq: Once | INTRAVENOUS | Status: AC
Start: 2016-08-18 — End: 2016-08-18
  Administered 2016-08-18: 50 mL via INTRAVENOUS
  Filled 2016-08-18: qty 50

## 2016-08-18 MED ORDER — SODIUM BICARBONATE 8.4 % IV SOLN
INTRAVENOUS | Status: AC
  Filled 2016-08-18: qty 50

## 2016-08-18 MED ORDER — MAGNESIUM SULFATE 2 GM/50ML IV SOLN
2.0000 g | Freq: Once | INTRAVENOUS | Status: AC
  Administered 2016-08-18: 2 g via INTRAVENOUS
  Filled 2016-08-18: qty 50

## 2016-08-18 MED ORDER — FUROSEMIDE 10 MG/ML IJ SOLN
40.0000 mg | Freq: Once | INTRAMUSCULAR | Status: AC
  Administered 2016-08-18: 40 mg via INTRAVENOUS
  Filled 2016-08-18: qty 4

## 2016-08-18 MED ORDER — SODIUM CHLORIDE 0.9 % IV SOLN
Freq: Once | INTRAVENOUS | Status: AC
  Administered 2016-08-19: 01:00:00 via INTRAVENOUS

## 2016-08-18 MED ORDER — MAGNESIUM SULFATE 2 GM/50ML IV SOLN
INTRAVENOUS | Status: AC
  Administered 2016-08-18: 2 g via INTRAVENOUS
  Filled 2016-08-18: qty 50

## 2016-08-18 MED ORDER — STERILE WATER FOR INJECTION IJ SOLN
INTRAMUSCULAR | Status: AC
  Administered 2016-08-18: 16:00:00
  Filled 2016-08-18: qty 10

## 2016-08-18 MED ORDER — ORAL CARE MOUTH RINSE
15.0000 mL | Freq: Two times a day (BID) | OROMUCOSAL | Status: DC
  Administered 2016-08-18 – 2016-08-23 (×11): 15 mL via OROMUCOSAL

## 2016-08-18 MED ORDER — INSULIN ASPART 100 UNIT/ML ~~LOC~~ SOLN
5.0000 [IU] | Freq: Once | SUBCUTANEOUS | Status: AC
  Administered 2016-08-18: 5 [IU] via INTRAVENOUS
  Filled 2016-08-18: qty 5

## 2016-08-18 MED ORDER — DEXTROSE 5 % IV SOLN
1.0000 g | Freq: Every day | INTRAVENOUS | Status: DC
  Administered 2016-08-18: 1 g via INTRAVENOUS
  Filled 2016-08-18 (×2): qty 1

## 2016-08-18 MED ORDER — SODIUM BICARBONATE 8.4 % IV SOLN
50.0000 meq | Freq: Once | INTRAVENOUS | Status: AC
  Administered 2016-08-18: 50 meq via INTRAVENOUS
  Filled 2016-08-18: qty 50

## 2016-08-18 MED ORDER — SODIUM CHLORIDE 0.9 % IV SOLN
1.0000 g | Freq: Once | INTRAVENOUS | Status: DC
  Filled 2016-08-18: qty 10

## 2016-08-18 MED ORDER — SODIUM BICARBONATE 8.4 % IV SOLN
50.0000 meq | Freq: Once | INTRAVENOUS | Status: AC
Start: 2016-08-18 — End: 2016-08-18
  Administered 2016-08-18: 50 meq via INTRAVENOUS

## 2016-08-18 MED ORDER — ACETAMINOPHEN 650 MG RE SUPP: 650.0000 mg | Freq: Four times a day (QID) | RECTAL | Status: DC | PRN

## 2016-08-18 MED ORDER — CALCIUM GLUCONATE 10 % IV SOLN
1.0000 g | Freq: Once | INTRAVENOUS | Status: AC
  Administered 2016-08-18: 1 g via INTRAVENOUS
  Filled 2016-08-18: qty 10

## 2016-08-18 MED ORDER — PIPERACILLIN-TAZOBACTAM 3.375 G IVPB 30 MIN: 3.3750 g | Freq: Once | INTRAVENOUS | Status: DC

## 2016-08-18 NOTE — Consult Note (Addendum)
[name] MD  [address] [place], [place] [address] Phone: [phone] Fax : [phone]  Consultation  Referring Provider:     Dr Verdell Carmine  Primary Care Physician:  [name], MD Primary Gastroenterologist:  None          Reason for Consultation:     Abnormal lft   Date of Admission:  08/18/2016 Date of Consultation:  08/18/2016         HPI:   Bruce [name] is a 71 y.o. male with a history of CHF,CKD ,HTN,TIA. He presented to the hospital today from skilled nursing facility for altered mental status , found tobe hyperkalemic , metabolic acidosis , found to have elevated LFT's . Presently being treated for metabolic encephelopathy , volume overload , renal failure, respiratory failure from volume overload . He has a history of CML and follows at [facility].   INR on admission is 4.17 , not detectable acetaminophen level. BNP >4500. Transaminases were normal in 07/2015 with elevated alkaline phosphotase. RUQ USG today shows gall bladder wall thickening without stones. No biliary obstruction.   He is very drowsy and unable to give any history , denied taking any herbal supplements but kept falling asleep while I was speaking to him.   Past Medical History:  Diagnosis Date  . Bell's palsy   . Chronic combined systolic and diastolic CHF, NYHA class 3 ([place])   . CKD (chronic kidney disease), stage II   . CML (chronic myelocytic leukemia) ([place])   . Coronary artery disease, non-occlusive   . Hypercholesterolemia   . Hypertension   . Leukemia ([place])   . Stroke ([place])    No residual limb weakness.  Walks with cane at baseline.   Marland Kitchen TIA (transient ischemic attack) 05/10/2014    Past Surgical History:  Procedure Laterality Date  . BACK SURGERY    . DIALYSIS/PERMA CATHETER INSERTION N/A 07/29/2016   Procedure: Dialysis/Perma Catheter Insertion;  Surgeon: Algernon Huxley, MD;  Location: [facility];  Service: Cardiovascular;  Laterality: N/A;  . HIP ARTHROPLASTY Right    orif  . ORIF  FOREARM FRACTURE Right     Prior to Admission medications   Medication Sig Start Date End Date Taking? Authorizing Provider  amiodarone (PACERONE) 200 MG tablet Take 1 tablet (200 mg total) by mouth daily. 12/30/15  Yes Shirley Friar, PA-C  metFORMIN (GLUCOPHAGE) 500 MG tablet Take 500 mg by mouth 2 (two) times daily with a meal.   Yes [provider]  metoprolol succinate (TOPROL-XL) 25 MG 24 hr tablet Take 0.5 tablets (12.5 mg total) by mouth daily. 08/04/16  Yes Gouru, Illene Silver, MD  tiotropium (SPIRIVA) 18 MCG inhalation capsule Place 18 mcg into inhaler and inhale daily.   Yes [provider]  acetaminophen (TYLENOL) 325 MG tablet Take 2 tablets (650 mg total) by mouth every 4 (four) hours as needed for headache or mild pain. 06/06/15   [name], MD  albuterol (PROVENTIL HFA;VENTOLIN HFA) 108 (90 BASE) MCG/ACT inhaler Inhale 2 puffs into the lungs every 4 (four) hours as needed for wheezing or shortness of breath. Patient not taking: Reported on 08/18/2016 08/08/14   Noemi Chapel, MD  glycopyrrolate (ROBINUL) 0.2 MG/ML injection Inject 1 mL (0.2 mg total) into the skin every 4 (four) hours as needed (excessive secretions). 08/04/16   Gouru, Illene Silver, MD  LORazepam (ATIVAN) 1 MG tablet Take 1 tablet (1 mg total) by mouth every 4 (four) hours as needed for anxiety. 08/04/16   Gouru,  Illene Silver, MD  Morphine Sulfate (MORPHINE CONCENTRATE) 10 MG/0.5ML SOLN concentrated solution Place 0.25 mLs (5 mg total) under the tongue every 2 (two) hours as needed for moderate pain (or dyspnea). 08/04/16   Gouru, Illene Silver, MD  ondansetron (ZOFRAN) 4 MG tablet Take 1 tablet (4 mg total) by mouth every 6 (six) hours as needed for nausea. 08/04/16   Gouru, Illene Silver, MD  senna-docusate (SENOKOT-S) 8.6-50 MG tablet Take 1 tablet by mouth at bedtime as needed for mild constipation. 08/04/16   Nicholes Mango, MD    Family History  Problem Relation Age of Onset  . Diabetes Mother   . Hypertension Mother   .  Diabetes Father   . Hypertension Father   . Diabetes Brother   . Hypertension Brother   . Diabetes Sister   . Hypertension Sister   . Diabetes Brother   . Hypertension Brother   . Diabetes Sister   . Hypertension Sister      Social History  Substance Use Topics  . Smoking status: Former Smoker    Packs/day: 0.50    Years: 50.00    Types: Cigarettes  . Smokeless tobacco: Never Used  . Alcohol use No    Allergies as of 08/18/2016  . (No Known Allergies)    Review of Systems:    Patient unable to provide any history   Physical Exam:  Vital signs in last 24 hours: Temp:  [94.1 F (34.5 C)-99.1 F (37.3 C)] 99.1 F (37.3 C) (05/09 1505) Pulse Rate:  [54-72] 72 (05/09 1505) Resp:  [15-29] 15 (05/09 1505) BP: (106-139)/(45-125) 121/45 (05/09 1505) SpO2:  [98 %-100 %] 100 % (05/09 1505) FiO2 (%):  [50 %-51 %] 50 % (05/09 1330) Weight:  [171 lb (77.6 kg)-179 lb 10.8 oz (81.5 kg)] 179 lb 10.8 oz (81.5 kg) (05/09 1430)   General:  Laying in bed very drowsy  Head:  Normocephalic and atraumatic. Eyes:  +icterus.   Conjunctiva pink. PERRLA. Ears:  Normal auditory acuity. Neck:  Supple; no masses or thyroidomegaly, has a porta cath in right side of neck , jvd at rt ear lobe base  Lungs: Respirations even and unlabored.decreased air entry at lung bases Heart:  Regular rate , systolic murmur in aortic area  Abdomen:  Soft, nondistended, nontender. Normal bowel sounds. No appreciable masses or hepatomegaly.  No rebound or guarding.  Rectal:  Not performed. Extremities:  Pitting edema 3+ b/l  Neurologic:  Alert and oriented x0; Skin:  Intact without significant lesions or rashes. Cervical Nodes:  No significant cervical adenopathy. Psych:  Unable to assess   LAB RESULTS:  Recent Labs  08/18/16 0518  WBC 27.7*  HGB 6.9*  HCT 24.3*  PLT 852*   BMET  Recent Labs  08/18/16 0726  NA 135  K 7.2*  CL 105  CO2 <7*  GLUCOSE 121*  BUN 53*  CREATININE 3.63*  CALCIUM  8.3*   LFT  Recent Labs  08/18/16 0726  PROT 6.6  ALBUMIN 3.1*  AST 629*  ALT 296*  ALKPHOS 273*  BILITOT 3.3*   PT/INR  Recent Labs  08/18/16 0724  LABPROT 41.4*  INR 4.17*    STUDIES: Dg Abdomen 1 View  Result Date: 08/18/2016 CLINICAL DATA:  Status post central line placement EXAM: ABDOMEN - 1 VIEW COMPARISON:  None. FINDINGS: Scattered large and small bowel gas is noted. Stomach is distended with air. A a right femoral central line is noted with the tip just above the acetabulum on the right.  Some scattered calcifications overlying the midline which may be related to the pancreas. No free air is seen. No bony abnormality is noted. Previous medullary rod placement in the right femur is noted. IMPRESSION: Central line as described. No abnormality is seen. Electronically Signed   By: Inez Catalina M.D.   On: 08/18/2016 08:00   Ct Head Wo Contrast  Result Date: 08/18/2016 CLINICAL DATA:  Altered mental status EXAM: CT HEAD WITHOUT CONTRAST TECHNIQUE: Contiguous axial images were obtained from the base of the skull through the vertex without intravenous contrast. COMPARISON:  05/02/2016 FINDINGS: Brain: Mild diffuse cerebral atrophy. No acute intracranial abnormality. Specifically, no hemorrhage, hydrocephalus, mass lesion, acute infarction, or significant intracranial injury. Vascular: No hyperdense vessel or unexpected calcification. Skull: No acute calvarial abnormality. Sinuses/Orbits: Visualized paranasal sinuses and mastoids clear. Orbital soft tissues unremarkable. Old bilateral medial orbital wall blowout fractures noted, stable. Other: None IMPRESSION: Diffuse cerebral atrophy.  No acute intracranial abnormality. Electronically Signed   By: Rolm Baptise M.D.   On: 08/18/2016 10:11   Dg Chest Portable 1 View  Result Date: 08/18/2016 CLINICAL DATA:  Sepsis EXAM: PORTABLE CHEST 1 VIEW COMPARISON:  07/29/2016 FINDINGS: Cardiac shadow remains enlarged. Right jugular dialysis catheter  is again seen in satisfactory position. The lungs are clear bilaterally. No acute bony abnormality is seen. IMPRESSION: No acute abnormality noted. Electronically Signed   By: Inez Catalina M.D.   On: 08/18/2016 08:00   US Abdomen Limited Ruq  Result Date: 08/18/2016 CLINICAL DATA:  Elevated LFTs EXAM: US ABDOMEN LIMITED - RIGHT UPPER QUADRANT COMPARISON:  None. FINDINGS: Gallbladder: Gallbladder wall thickening is noted to 5 mm. No definitive cholelithiasis or pericholecystic fluid is seen. Common bile duct: Diameter: 3.9 mm. Liver: No focal lesion identified. Within normal limits in parenchymal echogenicity. There is 8 degree of by directionality in the portal vein. This may represent some very early portal hypertension. Note is made of minimal free fluid surrounding the liver. This may contribute to the degree of gallbladder wall thickening. Mild increased echogenicity in the kidney is noted as well. IMPRESSION: Gallbladder wall thickening without cholelithiasis. This may be related to the incomplete distention or possibly related to the minimal amount of free fluid surrounding the liver. Increased echogenicity in the right kidney. Suggestion of early portal hypertension. Electronically Signed   By: Inez Catalina M.D.   On: 08/18/2016 14:27      Impression / Plan:   Bruce [name] is a 71 y.o. y/o male with multiple co morbidities here in the ICU with respiratory failure, sepsis, renal failure, fluid overload. I have been consulted for abnormal LFT's    Impression - acute liver failure - likely seconadary to heart failure/SEPSIS/ischemic  , presently has multiorgan failure and hence has very poor prognosis. Amiodarone too may have a role to play with abnormal LFT's . He also is encephelopathic which could be a combination from metabolic issues and liver failure    Plan  1. Vitamin K s.c 10 mg Q daily for 3 days  2. Acute viral hepatitis screen including EBV,CMV,HSV,VZV 3. Daily INR 4. Dialysis  will likely help with volume status and decrease hepatic congestion .  5. If alternative drug is available from cardiology point of view for amiodarone but may be hard.  6. USG doppler of liver vasculature  7. Not a candidate for liver transplant due to kidney failure and heart failure.   Thank you for involving me in the care of this patient.  LOS: 0 days   [name], MD  08/18/2016, 3:16 PM

## 2016-08-18 NOTE — ED Notes (Signed)
CODE SEPSIS CALLED TO DOUG AT [facility] 

## 2016-08-18 NOTE — Clinical Social Work Note (Signed)
Patient admitted to ICU today and even though the admission record states he came from [facility], he was actually sent to [facility] on 4/28. CSW will complete full assessment. Shela Leff MSW,LCSW [phone]

## 2016-08-18 NOTE — Progress Notes (Signed)
POST HD ASSESSMENT 

## 2016-08-18 NOTE — Progress Notes (Signed)
PRE HD ASSESSMENT 

## 2016-08-18 NOTE — Progress Notes (Signed)
[facility], [place] 08/18/16  Subjective:   Patient known to our practice from previous admissions. He was dialyzed during his last admission but his renal function recovered. At the time of previous discharge, his creatinine was 2.7. Patient was then lost to outpatient follow-up.  He now presents from an [facility] for diaphoresis and decreased responsiveness. His blood glucose was less than 20. His lab results, he was found to have potassium of 7.2, bicarbonate of less than 7,  severe lactic acidosis of 17, elevated AST, ALT, elevated creatinine of 3.63 with GFR of 18. Clinically, he is requiring oxygen supplementation with a nonrebreather mask, his abdomen is distended and he has massive fluid overload with anasarca over his legs. Nephrology consult has been requested for evaluation and we have been asked to dialyze the patient emergently  Objective:  Vital signs in last 24 hours:  Pulse Rate:  [54] 54 (05/09 0500) Resp:  [22-29] 29 (05/09 0545) BP: (121-139)/(67-125) 139/125 (05/09 0530) SpO2:  [98 %-100 %] 98 % (05/09 0519) Weight:  [77.6 kg (171 lb)] 77.6 kg (171 lb) (05/09 0512)  Weight change:  Filed Weights   08/18/16 0512  Weight: 77.6 kg (171 lb)    Intake/Output:    Intake/Output Summary (Last 24 hours) at 08/18/16 0929 Last data filed at 08/18/16 1025  Gross per 24 hour  Intake               50 ml  Output                0 ml  Net               50 ml     Physical Exam: General: Chronically ill-appearing, mild distress due to concurrent illness  HEENT Muddy sclera, nonrebreather mask in place  Neck Supple  Pulm/lungs Mild bilateral crackles at bases  CVS/Heart Regular rhythm   Abdomen:   distended from ascites,  Extremities: 3+ pitting edema   Neurologic: Alert, able to answer questions, speech is somewhat hard to understand   Skin: Warm, no acute rashes   Access: Right IJ PermCath        Basic Metabolic Panel:   Recent  Labs Lab 08/18/16 0726  NA 135  K 7.2*  CL 105  CO2 <7*  GLUCOSE 121*  BUN 53*  CREATININE 3.63*  CALCIUM 8.3*  MG 2.2     CBC:  Recent Labs Lab 08/18/16 0518  WBC 27.7*  NEUTROABS 24.1*  HGB 6.9*  HCT 24.3*  MCV 105.7*  PLT 852*     Lab Results  Component Value Date   HEPBSAG Negative 07/28/2016   HEPBSAB Reactive 07/28/2016   HEPBIGM Negative 09/14/2014      Microbiology:  No results found for this or any previous visit (from the past 240 hour(s)).  Coagulation Studies:  Recent Labs  08/18/16 0724  LABPROT 41.4*  INR 4.17*    Urinalysis: No results for input(s): COLORURINE, LABSPEC, PHURINE, GLUCOSEU, HGBUR, BILIRUBINUR, KETONESUR, PROTEINUR, UROBILINOGEN, NITRITE, LEUKOCYTESUR in the last 72 hours.  Invalid input(s): APPERANCEUR    Imaging: Dg Abdomen 1 View  Result Date: 08/18/2016 CLINICAL DATA:  Status post central line placement EXAM: ABDOMEN - 1 VIEW COMPARISON:  None. FINDINGS: Scattered large and small bowel gas is noted. Stomach is distended with air. A a right femoral central line is noted with the tip just above the acetabulum on the right. Some scattered calcifications overlying the midline which may be related to  the pancreas. No free air is seen. No bony abnormality is noted. Previous medullary rod placement in the right femur is noted. IMPRESSION: Central line as described. No abnormality is seen. Electronically Signed   By: Inez Catalina M.D.   On: 08/18/2016 08:00   Dg Chest Portable 1 View  Result Date: 08/18/2016 CLINICAL DATA:  Sepsis EXAM: PORTABLE CHEST 1 VIEW COMPARISON:  07/29/2016 FINDINGS: Cardiac shadow remains enlarged. Right jugular dialysis catheter is again seen in satisfactory position. The lungs are clear bilaterally. No acute bony abnormality is seen. IMPRESSION: No acute abnormality noted. Electronically Signed   By: Inez Catalina M.D.   On: 08/18/2016 08:00     Medications:   . sodium chloride    . calcium  gluconate 1 GM IV    . sodium chloride        Assessment/ Plan:  71 y.o.African-American  male  with history of alcoholism, severe chronic systolic congestive heart failure, history of stroke, hypertension, coronary disease, hyperlipidemia  returns for decreased responsiveness and is found to have low blood sugar, acute renal failure, hyperkalemia and fluid overload   1. Acute renal failure on CKD st 4 with severe hyperkalemia last known creatinine was 2.77 at the time of previous discharge/GFR 25 Acute renal failure at this time seems to be multifactorial. With lactic acidosis, infection is suspected. Other contributing factors could be exacerbation of congestive heart failure leading to cardiorenal syndrome and eventually volume overload Patient has received shifting measures for severe hyperkalemia. He had EKG changes in the emergency room We will arrange for emergent dialysis once patient is transferred to the ICU  - We will obtain blood cultures during dialysis  -Patient has already received empiric antibiotics    2. Anemia of CKD  Lab Results  Component Value Date   HGB 6.9 (L) 08/18/2016   Obtain iron studies, ferritin, transferrin  3. Severe volume overload with anasarca, abdominal distention and lower extremity edema - We will perform urgent dialysis  - Fluid removal as tolerated  4. Severe acidosis - We will perform emergent dialysis - This was discussed with patient. He has agreed to proceed with hemodialysis.     LOS: 0 [facility] 5/9/20189:29 [place], [place]

## 2016-08-18 NOTE — Progress Notes (Signed)
Patient alert and oriented- though will fall alseep after asking questions- vitals stable- patient had dialysis this afternoon- but catheter clotted off twice and only a 4th of a liter was pulled per dialysis RN.  Cath-flo is indwelling for overnight- and dialysis will be performed tomorrow per Dr. Candiss Norse- Potassium recheck was 6.2 and troponin.09- Dr. Candiss Norse aware and ordered lasix once.   Patient resting at this time.

## 2016-08-18 NOTE — Progress Notes (Signed)
HD start w/ no complications. CVC patent post one hour TPA dwell.

## 2016-08-18 NOTE — Progress Notes (Signed)
   PRE HD INFO

## 2016-08-18 NOTE — ED Triage Notes (Signed)
Pt presents after staff at [facility] found pt diaphoretic and unresponsive Staff checked CBG < 20. Pt w/o hx of DMII. Staff administered glucagon x 1, recehck CBG apter 30 mins, no change in CBG. EMS called, started PIV, administered D50 x 2 amps. Pt is confused and c/o pain in back and abdomen.

## 2016-08-18 NOTE — Progress Notes (Signed)
Unable to start HD d/t catheter not flushing. Arterial line clotted. Candiss Norse MD notified. TPA dwell 1 hr ordered.

## 2016-08-18 NOTE — Consult Note (Signed)
Name: Bruce [name] MRN: [id number] DOB: 12-Apr-1946    ADMISSION DATE:  08/18/2016 CONSULTATION DATE:  08/18/2016  REFERRING MD :  Dr. Verdell Carmine  CHIEF COMPLAINT:  Hypoglycemia and AMS  BRIEF PATIENT DESCRIPTION:  71 yo male admitted 05/9 with acute on chronic hypoxic respiratory failure secondary to volume overload due to combined diastolic and systolic CHF, acute renal failure with hyperkalemia, lactic acidosis, elevated liver enzymes, ?septic shock with unknown etiology, acute encephalopathy secondary to metabolic acidosis and hypoglycemia   SIGNIFICANT EVENTS  05/9-Pt admitted to ICU requiring emergent HD   STUDIES:  CT Head 05/9>>Diffuse cerebral atrophy.  No acute intracranial abnormality  HISTORY OF PRESENT ILLNESS:   This is a 71 yo male with a PMH of TIA, Stroke (no residual effects), Chronic myelocytic leukemia, HTN, Hypercholesterolemia, CAD, Chronic combined systolic and diastolic CHF, Bell's Palsy, and CKD stage II (previously dialyzed previous hospitalization in April 2018 but renal function recovered received no outpatient hemodialysis right chest dialysis permcath placed 07/29/16).  He presented to [facility] ER 05/9 from [facility] with altered mental status, shortness of breath, chest and abdominal pain, and hypoglycemia with CBG <20 he was given glucagon x3 doses.  Per ER notes pt arrived via EMS confused upon arrival lab results revealed K+ 7.2, Creat 3.63, Alk Phos 273, AST 629, ALT 296, Lactic acid 17.4, BNP >4,500, PCT 7.93, Serum glucose 121, WBC 27.7, hgb 6.9, Ammonia 81, PT 41.4, INR 4.17, and pt hypothermic temp 94.1 F. Due to lab results he met sepsis criteria he was given 2.5L NS fluid bolus in the ER and empiric abx.  He was subsequently admitted by hospitalist team 05/9 to ICU for further workup and treatment Nephrology consulted for emergent hemodialysis 05/9.   PAST MEDICAL HISTORY :   has a past medical history of Bell's palsy; Chronic combined systolic and  diastolic CHF, NYHA class 3 ([place]); CKD (chronic kidney disease), stage II; CML (chronic myelocytic leukemia) ([place]); Coronary artery disease, non-occlusive; Hypercholesterolemia; Hypertension; Leukemia ([place]); Stroke [facility]); and TIA (transient ischemic attack) (05/10/2014).  has a past surgical history that includes Back surgery; Hip Arthroplasty (Right); ORIF forearm fracture (Right); and Dialysis/Perma Catheter Insertion (N/A, 07/29/2016). Prior to Admission medications   Medication Sig Start Date End Date Taking? Authorizing Provider  amiodarone (PACERONE) 200 MG tablet Take 1 tablet (200 mg total) by mouth daily. 12/30/15  Yes Shirley Friar, PA-C  metFORMIN (GLUCOPHAGE) 500 MG tablet Take 500 mg by mouth 2 (two) times daily with a meal.   Yes [provider]  metoprolol succinate (TOPROL-XL) 25 MG 24 hr tablet Take 0.5 tablets (12.5 mg total) by mouth daily. 08/04/16  Yes Gouru, Illene Silver, MD  tiotropium (SPIRIVA) 18 MCG inhalation capsule Place 18 mcg into inhaler and inhale daily.   Yes [provider]  acetaminophen (TYLENOL) 325 MG tablet Take 2 tablets (650 mg total) by mouth every 4 (four) hours as needed for headache or mild pain. 06/06/15   [name], MD  albuterol (PROVENTIL HFA;VENTOLIN HFA) 108 (90 BASE) MCG/ACT inhaler Inhale 2 puffs into the lungs every 4 (four) hours as needed for wheezing or shortness of breath. Patient not taking: Reported on 08/18/2016 08/08/14   Noemi Chapel, MD  glycopyrrolate (ROBINUL) 0.2 MG/ML injection Inject 1 mL (0.2 mg total) into the skin every 4 (four) hours as needed (excessive secretions). 08/04/16   Gouru, Illene Silver, MD  LORazepam (ATIVAN) 1 MG tablet Take 1 tablet (1 mg total) by mouth every 4 (four) hours as  needed for anxiety. 08/04/16   Nicholes Mango, MD  Morphine Sulfate (MORPHINE CONCENTRATE) 10 MG/0.5ML SOLN concentrated solution Place 0.25 mLs (5 mg total) under the tongue every 2 (two) hours as needed for moderate pain (or  dyspnea). 08/04/16   Gouru, Illene Silver, MD  ondansetron (ZOFRAN) 4 MG tablet Take 1 tablet (4 mg total) by mouth every 6 (six) hours as needed for nausea. 08/04/16   Gouru, Illene Silver, MD  senna-docusate (SENOKOT-S) 8.6-50 MG tablet Take 1 tablet by mouth at bedtime as needed for mild constipation. 08/04/16   Nicholes Mango, MD   No Known Allergies  FAMILY HISTORY:  family history includes Diabetes in his brother, brother, father, mother, sister, and sister; Hypertension in his brother, brother, father, mother, sister, and sister. SOCIAL HISTORY:  reports that he has quit smoking. His smoking use included Cigarettes. He has a 25.00 pack-year smoking history. He has never used smokeless tobacco. He reports that he uses drugs. He reports that he does not drink alcohol.  REVIEW OF SYSTEMS:  Positives in BOLD  Constitutional: Negative for fever, chills, weight loss, malaise/fatigue and diaphoresis.  HENT: Negative for hearing loss, ear pain, nosebleeds, congestion, sore throat, neck pain, tinnitus and ear discharge.   Eyes: Negative for blurred vision, double vision, photophobia, pain, discharge and redness.  Respiratory: cough, hemoptysis, sputum production, shortness of breath, wheezing and stridor.   Cardiovascular: chest pain, palpitations, orthopnea, claudication, leg swelling and PND.  Gastrointestinal: heartburn, nausea, vomiting, abdominal pain, diarrhea, constipation, blood in stool and melena.  Genitourinary: Negative for dysuria, urgency, frequency, hematuria and flank pain.  Musculoskeletal: Negative for myalgias, back pain, joint pain and falls.  Skin: Negative for itching and rash.  Neurological: Negative for dizziness, tingling, tremors, sensory change, speech change, focal weakness, seizures, loss of consciousness, weakness and headaches.  Endo/Heme/Allergies: Negative for environmental allergies and polydipsia. Does not bruise/bleed easily.  SUBJECTIVE:  Pt c/o nausea and abdominal pain no  other complaints at this time   VITAL SIGNS: Temp:  [94.1 F (34.5 C)-95 F (35 C)] 95 F (35 C) (05/09 1034) Pulse Rate:  [54-64] 64 (05/09 1034) Resp:  [17-29] 22 (05/09 1034) BP: (106-139)/(50-125) 122/50 (05/09 1034) SpO2:  [98 %-100 %] 98 % (05/09 1034) Weight:  [77.6 kg (171 lb)-81.5 kg (179 lb 10.8 oz)] 81.5 kg (179 lb 10.8 oz) (05/09 1034)  PHYSICAL EXAMINATION: General: well developed, well nourished, AA male, NAD Neuro: alert and oriented, follows commands, PERRLA HEENT: supple, JVD present  Cardiovascular: nsr, s1s2, no M/R/G  Lungs: faint crackles bilateral bases, diminished throughout, even, non labored on nonrebreather  Abdomen: +BS x4, soft, tender, non distended  Musculoskeletal: moves all extremities, 2+ bilateral lower extremity pitting edema  Skin: intact no rashes or lesions    Recent Labs Lab 08/18/16 0726  NA 135  K 7.2*  CL 105  CO2 <7*  BUN 53*  CREATININE 3.63*  GLUCOSE 121*    Recent Labs Lab 08/18/16 0518  HGB 6.9*  HCT 24.3*  WBC 27.7*  PLT 852*   Dg Abdomen 1 View  Result Date: 08/18/2016 CLINICAL DATA:  Status post central line placement EXAM: ABDOMEN - 1 VIEW COMPARISON:  None. FINDINGS: Scattered large and small bowel gas is noted. Stomach is distended with air. A a right femoral central line is noted with the tip just above the acetabulum on the right. Some scattered calcifications overlying the midline which may be related to the pancreas. No free air is seen. No bony abnormality is noted. Previous  medullary rod placement in the right femur is noted. IMPRESSION: Central line as described. No abnormality is seen. Electronically Signed   By: Inez Catalina M.D.   On: 08/18/2016 08:00   Ct Head Wo Contrast  Result Date: 08/18/2016 CLINICAL DATA:  Altered mental status EXAM: CT HEAD WITHOUT CONTRAST TECHNIQUE: Contiguous axial images were obtained from the base of the skull through the vertex without intravenous contrast. COMPARISON:   05/02/2016 FINDINGS: Brain: Mild diffuse cerebral atrophy. No acute intracranial abnormality. Specifically, no hemorrhage, hydrocephalus, mass lesion, acute infarction, or significant intracranial injury. Vascular: No hyperdense vessel or unexpected calcification. Skull: No acute calvarial abnormality. Sinuses/Orbits: Visualized paranasal sinuses and mastoids clear. Orbital soft tissues unremarkable. Old bilateral medial orbital wall blowout fractures noted, stable. Other: None IMPRESSION: Diffuse cerebral atrophy.  No acute intracranial abnormality. Electronically Signed   By: Rolm Baptise M.D.   On: 08/18/2016 10:11   Dg Chest Portable 1 View  Result Date: 08/18/2016 CLINICAL DATA:  Sepsis EXAM: PORTABLE CHEST 1 VIEW COMPARISON:  07/29/2016 FINDINGS: Cardiac shadow remains enlarged. Right jugular dialysis catheter is again seen in satisfactory position. The lungs are clear bilaterally. No acute bony abnormality is seen. IMPRESSION: No acute abnormality noted. Electronically Signed   By: Inez Catalina M.D.   On: 08/18/2016 08:00    ASSESSMENT / PLAN: Acute encephalopathy secondary to metabolic acidosis and hypoglycemia Acute on chronic renal failure with severe hyperkalemia  ?Septic shock no clear source of infection at this time Acute on chronic hypoxic respiratory failure secondary to volume overload due to combined systolic and diastolic CHF Elevated liver enzymes  Chronic Leukocytosis  Chronic Anemia without acute blood loss  Elevated coag's P: Supplemental O2 to maintain O2 sats >92% Prn bronchodilator therapy  Nephrology consulted appreciate input emergent hemodialysis today 05/9 Replace electrolytes as indicated Monitor UOP Continuous telemetry monitoring  Repeat BMP and CBC today  Transfuse 1 unit pRBC's SCD's for VTE prophylaxis, no chemical VTE prophylaxis  Monitor for s/sx of bleeding Trend coag's [place] Abd Limited RUQ pending  Hepatitis panel pending Gastroenterology consulted  appreciate input May need CT Abd and Pelvis once able to tolerate po contrast  Trend WBC and monitor fever curve Trend PCT and lactic acid Follow cultures Continue empiric abx for now  Avoid sedating and nephrotoxic medications  Marda Stalker, [place] Pager [phone] (please enter 7 digits) PCCM Consult Pager [phone] (please enter 7 digits)

## 2016-08-18 NOTE — Progress Notes (Signed)
HD START 

## 2016-08-18 NOTE — ED Notes (Signed)
CCU RN request that pt be taken to CT prior to transportation.  CT notified.

## 2016-08-18 NOTE — ED Notes (Signed)
Lab contacted to follow up on repeat lactic acid which was drawn around 0800; tech states dilution needs to be done and it should be about 10-15 more minutes.

## 2016-08-18 NOTE — Progress Notes (Signed)
   END OF HD 

## 2016-08-18 NOTE — Progress Notes (Signed)
  POST HD VITALS

## 2016-08-18 NOTE — ED Notes (Signed)
Patient transported to CT 

## 2016-08-18 NOTE — H&P (Signed)
[place] [facility] [place] NAME: Bruce [name]    MR#:  [id number]  DATE OF BIRTH:  1945-09-06  DATE OF ADMISSION:  08/18/2016  PRIMARY CARE PHYSICIAN: [name], MD   REQUESTING/REFERRING PHYSICIAN: Dr. Hinda Kehr  CHIEF COMPLAINT:   Chief Complaint  Patient presents with  . Hypoglycemia  . Altered Mental Status    HISTORY OF PRESENT ILLNESS:  Bruce [name]  is a 71 y.o. male with a known history of Chronic kidney disease stage III, history of CML, history of chronic anemia, hypertension, hyperlipidemia, chronic combined diastolic systolic CHF, history of previous CVA who presents to the hospital from a skilled nursing facility due to altered mental status. Patient himself is a very poor historian therefore most history obtained from the ER physician and from the chart. Patient was sent from the skilled nursing facility as he was confused and altered and presented to the ER and on blood work was noted to be hyperkalemic and also noted to have metabolic acidosis. Patient does have a permacath on his right chest but is not on permanent dialysis yet. Patient was hospitalized recently and started on intermittent hemodialysis but was not deemed end-stage renal disease but acute kidney disease. Patient presented to the hospital and was noted to be critically ill and hospitalist services were contacted further treatment and evaluation.  PAST MEDICAL HISTORY:   Past Medical History:  Diagnosis Date  . Bell's palsy   . Chronic combined systolic and diastolic CHF, NYHA class 3 ([place])   . CKD (chronic kidney disease), stage II   . CML (chronic myelocytic leukemia) ([place])   . Coronary artery disease, non-occlusive   . Hypercholesterolemia   . Hypertension   . Leukemia ([place])   . Stroke ([place])    No residual limb weakness.  Walks with cane at baseline.   [place] Kitchen TIA (transient ischemic attack) 05/10/2014    PAST SURGICAL HISTORY:   Past Surgical  History:  Procedure Laterality Date  . BACK SURGERY    . DIALYSIS/PERMA CATHETER INSERTION N/A 07/29/2016   Procedure: Dialysis/Perma Catheter Insertion;  Surgeon: Algernon Huxley, MD;  Location: [place];  Service: Cardiovascular;  Laterality: N/A;  . HIP ARTHROPLASTY Right    orif  . ORIF FOREARM FRACTURE Right     SOCIAL HISTORY:   Social History  Substance Use Topics  . Smoking status: Former Smoker    Packs/day: 0.50    Years: 50.00    Types: Cigarettes  . Smokeless tobacco: Never Used  . Alcohol use No    FAMILY HISTORY:   Family History  Problem Relation Age of Onset  . Diabetes Mother   . Hypertension Mother   . Diabetes Father   . Hypertension Father   . Diabetes Brother   . Hypertension Brother   . Diabetes Sister   . Hypertension Sister   . Diabetes Brother   . Hypertension Brother   . Diabetes Sister   . Hypertension Sister     DRUG ALLERGIES:  No Known Allergies  REVIEW OF SYSTEMS:   Review of Systems  Unable to perform ROS: Critical illness    MEDICATIONS AT HOME:   Prior to Admission medications   Medication Sig Start Date End Date Taking? Authorizing Provider  acetaminophen (TYLENOL) 325 MG tablet Take 2 tablets (650 mg total) by mouth every 4 (four) hours as needed for headache or mild pain. 06/06/15   [name], MD  albuterol (PROVENTIL HFA;VENTOLIN HFA)  108 (90 BASE) MCG/ACT inhaler Inhale 2 puffs into the lungs every 4 (four) hours as needed for wheezing or shortness of breath. 08/08/14   Noemi Chapel, MD  amiodarone (PACERONE) 200 MG tablet Take 1 tablet (200 mg total) by mouth daily. 12/30/15   Shirley Friar, PA-C  glycopyrrolate (ROBINUL) 0.2 MG/ML injection Inject 1 mL (0.2 mg total) into the skin every 4 (four) hours as needed (excessive secretions). 08/04/16   Nicholes Mango, MD  haloperidol (HALDOL) 2 MG/ML solution Place 0.3 mLs (0.6 mg total) under the tongue every 4 (four) hours as needed for agitation (or  delirium). 08/04/16   Gouru, Illene Silver, MD  ipratropium-albuterol (DUONEB) 0.5-2.5 (3) MG/3ML SOLN Take 3 mLs by nebulization every 6 (six) hours as needed. 08/04/16   Gouru, Illene Silver, MD  LORazepam (ATIVAN) 1 MG tablet Take 1 tablet (1 mg total) by mouth every 4 (four) hours as needed for anxiety. 08/04/16   Nicholes Mango, MD  metFORMIN (GLUCOPHAGE) 500 MG tablet Take 500 mg by mouth 2 (two) times daily with a meal.    [provider]  metoprolol succinate (TOPROL-XL) 25 MG 24 hr tablet Take 0.5 tablets (12.5 mg total) by mouth daily. 08/04/16   Nicholes Mango, MD  Morphine Sulfate (MORPHINE CONCENTRATE) 10 MG/0.5ML SOLN concentrated solution Place 0.25 mLs (5 mg total) under the tongue every 2 (two) hours as needed for moderate pain (or dyspnea). 08/04/16   Gouru, Illene Silver, MD  ondansetron (ZOFRAN) 4 MG tablet Take 1 tablet (4 mg total) by mouth every 6 (six) hours as needed for nausea. 08/04/16   Gouru, Illene Silver, MD  senna-docusate (SENOKOT-S) 8.6-50 MG tablet Take 1 tablet by mouth at bedtime as needed for mild constipation. 08/04/16   Nicholes Mango, MD  tiotropium (SPIRIVA) 18 MCG inhalation capsule Place 18 mcg into inhaler and inhale daily.    [provider]      VITAL SIGNS:  Blood pressure (!) 139/125, pulse (!) 54, resp. rate (!) 29, height 5\' 8"  (1.727 m), weight 77.6 kg (171 lb), SpO2 98 %.  PHYSICAL EXAMINATION:  Physical Exam  GENERAL:  71 y.o.-year-old patient lying in the bed lethargic and difficult to understand.   EYES: Pupils equal, round, reactive to light. No scleral icterus. Extraocular muscles intact.  HEENT: Head atraumatic, normocephalic. Oropharynx and nasopharynx clear. No oropharyngeal erythema, dry oral mucosa  NECK:  Supple, no jugular venous distention. No thyroid enlargement, no tenderness.  LUNGS: Normal breath sounds bilaterally, no wheezing, rales, rhonchi. No use of accessory muscles of respiration.  CARDIOVASCULAR: S1, S2 RRR. No murmurs, rubs, gallops, clicks.   ABDOMEN: Soft, nontender, nondistended. Bowel sounds present. No organomegaly or mass.  EXTREMITIES: No pedal edema, cyanosis, or clubbing. + 2 pedal & radial pulses b/l.   NEUROLOGIC: Cranial nerves II through XII are intact. No focal Motor or sensory deficits appreciated b/l. Globally weak PSYCHIATRIC: The patient is alert and oriented x 1. SKIN: No obvious rash, lesion, or ulcer.   Right Chest wall Perm-cath in place.   LABORATORY PANEL:   CBC  Recent Labs Lab 08/18/16 0518  WBC 27.7*  HGB 6.9*  HCT 24.3*  PLT 852*   ------------------------------------------------------------------------------------------------------------------  Chemistries   Recent Labs Lab 08/18/16 0726  NA 135  K 7.2*  CL 105  CO2 <7*  GLUCOSE 121*  BUN 53*  CREATININE 3.63*  CALCIUM 8.3*  MG 2.2  AST 629*  ALT 296*  ALKPHOS 273*  BILITOT 3.3*   ------------------------------------------------------------------------------------------------------------------  Cardiac Enzymes  Recent  Labs Lab 08/18/16 0726  TROPONINI 0.06*   ------------------------------------------------------------------------------------------------------------------  RADIOLOGY:  Dg Abdomen 1 View  Result Date: 08/18/2016 CLINICAL DATA:  Status post central line placement EXAM: ABDOMEN - 1 VIEW COMPARISON:  None. FINDINGS: Scattered large and small bowel gas is noted. Stomach is distended with air. A a right femoral central line is noted with the tip just above the acetabulum on the right. Some scattered calcifications overlying the midline which may be related to the pancreas. No free air is seen. No bony abnormality is noted. Previous medullary rod placement in the right femur is noted. IMPRESSION: Central line as described. No abnormality is seen. Electronically Signed   By: Inez Catalina M.D.   On: 08/18/2016 08:00   Dg Chest Portable 1 View  Result Date: 08/18/2016 CLINICAL DATA:  Sepsis EXAM: PORTABLE CHEST 1  VIEW COMPARISON:  07/29/2016 FINDINGS: Cardiac shadow remains enlarged. Right jugular dialysis catheter is again seen in satisfactory position. The lungs are clear bilaterally. No acute bony abnormality is seen. IMPRESSION: No acute abnormality noted. Electronically Signed   By: Inez Catalina M.D.   On: 08/18/2016 08:00     IMPRESSION AND PLAN:   71 year old male with past medical history of chronic myelogenous leukemia, chronic kidney disease stage III, hypertension, history of coronary artery disease, chronic combined systolic diastolic CHF who presents to the hospital due to altered mental status.  1. Altered mental status-metabolic encephalopathy secondary to lactic acidosis, volume overload and worsening renal failure. -Patient to get emergent hemodialysis due to his severe hyperkalemia and worsening renal function. Follow mental status. Pending CT head to rule out intracranial pathology which is unlikely.  2. Acute respiratory failure with hypoxia-secondary to volume overload from combined diastolic and systolic CHF and worsening renal failure. -Continue O2 supplementation, patient to get emergent hemodialysis to have fluid removed and to correct his hyperkalemia.  3. Hyperkalemia-secondary to acute on chronic renal failure.  - he got insulin, D50 and calcium gluconate in the ER. Seen by nephrology already has a right chest PermCath and patient to get emergent hemodialysis today. Follow potassium. Keep on telemetry.  4. Acute on chronic renal failure-patient's baseline creatinine last month was close to 2.7 and now it's elevated to 3.6 and he presents with volume overload and hyperkalemia. -Seen by nephrology and plan for emergent hemodialysis today. Continue further care as per nephrology.  5. Lactic acidosis-etiology unclear presently but suspected due to hypoperfusion. No clinical evidence of sepsis. Patient has been empirically given 1 dose of IV vancomycin, Zosyn and will hold off on  further antibiotics for now.  -chest x-ray negative for acute pathology. An patient had no other infectious source presently.  6. History of CML-patient has chronic leukocytosis, thrombocytosis, and also anemia. -He is followed extensively at [facility]. Transfuse 1 unit of packed red blood cells given his worsening anemia. - consult Oncology if needed.   Patient's prognosis is very poor given his multiple comorbidities. Consider palliative care consult.  All the records are reviewed and case discussed with ED provider. Management plans discussed with the patient, family and they are in agreement.  CODE STATUS: DO NOT RESUSCITATE  TOTAL Critical Care TIME TAKING CARE OF THIS PATIENT: 50 minutes.    Henreitta Leber M.D on 08/18/2016 at 8:52 AM  Between 7am to 6pm - Pager - [phone]  After 6pm go to [email or web address] - password [email or web address] [facility]  [place] [facility]  Office  [phone]  CC: Primary care physician; [name], MD

## 2016-08-18 NOTE — Progress Notes (Signed)
Pharmacy Antibiotic Note  Bruce [name] is a 71 y.o. male with a h/o CML and CHF admitted on 08/18/2016 with acute respiratory failure, possible sepsis of unknown source.  Pharmacy has been consulted for cefepime dosing. Patient received initial doses of vancomycin and Zosyn.   Plan: Cefepime 1 g iv q 24 hours.   Height: 5\' 8"  (172.7 cm) Weight: 179 lb 10.8 oz (81.5 kg) IBW/kg (Calculated) : 68.4  Temp (24hrs), Avg:94.8 F (34.9 C), Min:94.1 F (34.5 C), Max:95.5 F (35.3 C)   Recent Labs Lab 08/18/16 0518 08/18/16 0724 08/18/16 0726  WBC 27.7*  --   --   CREATININE  --   --  3.63*  LATICACIDVEN 17.4* 15.9*  --     Estimated Creatinine Clearance: 18.3 mL/min (A) (by C-G formula based on SCr of 3.63 mg/dL (H)).    No Known Allergies  Antimicrobials this admission: Zosyn and  vancomycin >> x 1 cefepime 5/9 >>   Dose adjustments this admission:   Microbiology results: 5/9 BCx: sent 5/9 MRSA PCR: negative  Thank you for allowing pharmacy to be a part of this patient's care.  Ulice Dash D 08/18/2016 1:15 PM

## 2016-08-18 NOTE — ED Provider Notes (Addendum)
[facility] Emergency Department Provider Note  ____________________________________________   First MD Initiated Contact with Patient 08/18/16 [phone]     (approximate)  I have reviewed the triage vital signs and the nursing notes.   HISTORY  Chief Complaint Hypoglycemia and Altered Mental Status  Level 5 caveat:  history/ROS limited by acute/critical illness  HPI Bruce [name] is a 71 y.o. male with extensive PMH who presents by EMS for altered mental status and hypoglycemia.  Reportedly he was found on the ground at [facility] minimally responsive.  They checked a blood sugar and it was reportedly about 20.  We gave him injection of glucagon and then rechecked it about 30 minutes later and it was still 20 and he was still unresponsive so he was transported by EMS.  Upon arrival to the emergency department and he is moaning and clearly in distress but not able to articulate except that "everything hurts".  He is complaining of chest and abdominal pain and has an elevated respiratory rate.  His blood pressure is stable and it is extremely difficult to obtain an SPO2 as his hands are cold.  We are unable to palpate radial pulses.   Past Medical History:  Diagnosis Date  . Bell's palsy   . Chronic combined systolic and diastolic CHF, NYHA class 3 ([place])   . CKD (chronic kidney disease), stage II   . CML (chronic myelocytic leukemia) ([place])   . Coronary artery disease, non-occlusive   . Hypercholesterolemia   . Hypertension   . Leukemia ([place])   . Stroke ([place])    No residual limb weakness.  Walks with cane at baseline.   Marland Kitchen TIA (transient ischemic attack) 05/10/2014    Patient Active Problem List   Diagnosis Date Noted  . Hyperkalemia 08/18/2016  . Anemia   . Palliative care by specialist   . Goals of care, counseling/discussion   . Advance care planning   . Adjustment disorder with mixed anxiety and depressed mood 08/02/2016  . Melena   . Acute  on chronic systolic CHF (congestive heart failure) ([place]) 05/07/2016  . Unsteady gait 05/07/2016  . Atypical chest pain 05/05/2016  . Cardiomyopathy 05/05/2016  . Atrial arrhythmia 05/05/2016  . Muscle weakness (generalized)   . Thrombocytosis ([place])   . Chest pain 05/02/2016  . Near syncope 05/02/2016  . Acute respiratory distress 03/22/2016  . Homeless 03/22/2016  . Slurred speech 03/22/2016  . History of stroke 03/22/2016  . Elevated lactic acid level 10/11/2015  . Systolic congestive heart failure ([place]) 10/10/2015  . CKD (chronic kidney disease), stage II   . Dizziness 01/25/2015  . Acute on chronic combined systolic and diastolic CHF, NYHA class 3 ([place]) 01/25/2015  . Essential hypertension 01/25/2015  . Compliance poor 01/25/2015  . Coronary artery disease, non-occlusive   . H/O: stroke with residual effects 09/13/2014  . Transaminitis 09/13/2014  . Elevated troponin 09/13/2014  . TIA (transient ischemic attack) 05/10/2014  . CML (chronic myelocytic leukemia) ([place]) 03/27/2014  . Leukocytosis 03/18/2014    Past Surgical History:  Procedure Laterality Date  . BACK SURGERY    . DIALYSIS/PERMA CATHETER INSERTION N/A 07/29/2016   Procedure: Dialysis/Perma Catheter Insertion;  Surgeon: Algernon Huxley, MD;  Location: [place] [facility];  Service: Cardiovascular;  Laterality: N/A;  . HIP ARTHROPLASTY Right    orif  . ORIF FOREARM FRACTURE Right     Prior to Admission medications   Medication Sig Start Date End Date Taking? Authorizing Provider  acetaminophen (TYLENOL) 325 MG tablet Take 2 tablets (650 mg total) by mouth every 4 (four) hours as needed for headache or mild pain. 06/06/15   [name], MD  albuterol (PROVENTIL HFA;VENTOLIN HFA) 108 (90 BASE) MCG/ACT inhaler Inhale 2 puffs into the lungs every 4 (four) hours as needed for wheezing or shortness of breath. 08/08/14   Noemi Chapel, MD  amiodarone (PACERONE) 200 MG tablet Take 1 tablet (200 mg total) by mouth daily.  12/30/15   Shirley Friar, PA-C  glycopyrrolate (ROBINUL) 0.2 MG/ML injection Inject 1 mL (0.2 mg total) into the skin every 4 (four) hours as needed (excessive secretions). 08/04/16   Nicholes Mango, MD  haloperidol (HALDOL) 2 MG/ML solution Place 0.3 mLs (0.6 mg total) under the tongue every 4 (four) hours as needed for agitation (or delirium). 08/04/16   Gouru, Illene Silver, MD  ipratropium-albuterol (DUONEB) 0.5-2.5 (3) MG/3ML SOLN Take 3 mLs by nebulization every 6 (six) hours as needed. 08/04/16   Gouru, Illene Silver, MD  LORazepam (ATIVAN) 1 MG tablet Take 1 tablet (1 mg total) by mouth every 4 (four) hours as needed for anxiety. 08/04/16   Nicholes Mango, MD  metFORMIN (GLUCOPHAGE) 500 MG tablet Take 500 mg by mouth 2 (two) times daily with a meal.    [provider]  metoprolol succinate (TOPROL-XL) 25 MG 24 hr tablet Take 0.5 tablets (12.5 mg total) by mouth daily. 08/04/16   Nicholes Mango, MD  Morphine Sulfate (MORPHINE CONCENTRATE) 10 MG/0.5ML SOLN concentrated solution Place 0.25 mLs (5 mg total) under the tongue every 2 (two) hours as needed for moderate pain (or dyspnea). 08/04/16   Gouru, Illene Silver, MD  ondansetron (ZOFRAN) 4 MG tablet Take 1 tablet (4 mg total) by mouth every 6 (six) hours as needed for nausea. 08/04/16   Gouru, Illene Silver, MD  senna-docusate (SENOKOT-S) 8.6-50 MG tablet Take 1 tablet by mouth at bedtime as needed for mild constipation. 08/04/16   Nicholes Mango, MD  tiotropium (SPIRIVA) 18 MCG inhalation capsule Place 18 mcg into inhaler and inhale daily.    [provider]    Allergies Patient has no known allergies.  Family History  Problem Relation Age of Onset  . Diabetes Mother   . Hypertension Mother   . Diabetes Father   . Hypertension Father   . Diabetes Brother   . Hypertension Brother   . Diabetes Sister   . Hypertension Sister   . Diabetes Brother   . Hypertension Brother   . Diabetes Sister   . Hypertension Sister     Social History Social History    Substance Use Topics  . Smoking status: Former Smoker    Packs/day: 0.50    Years: 50.00    Types: Cigarettes  . Smokeless tobacco: Never Used  . Alcohol use No    Review of Systems Level 5 caveat:  history/ROS limited by acute/critical illness  ____________________________________________   PHYSICAL EXAM:  VITAL SIGNS: ED Triage Vitals  Enc Vitals Group     BP 08/18/16 0500 121/71     Pulse Rate 08/18/16 0500 (!) 54     Resp 08/18/16 0500 (!) 27     Temp --      Temp src --      SpO2 08/18/16 0500 100 %     Weight 08/18/16 0512 171 lb (77.6 kg)     Height 08/18/16 0512 5\' 8"  (1.727 m)     Head Circumference --      Peak Flow --  Pain Score --      Pain Loc --      Pain Edu? --      Excl. in [place]? --     Constitutional: Altered, in severe distress, toxic appearance Eyes: Muddy sclera Head: Atraumatic. Nose: No congestion/rhinnorhea. Mouth/Throat: Mucous membranes are dry Neck: No stridor.  No meningeal signs.   Cardiovascular: Normal rate, regular rhythm.  Poor peripheral circulation with cool extremities and no palpable radial pulses bilaterally. Grossly normal heart sounds. Respiratory: Increased respiratory rate.  No retractions. Lungs CTAB. Gastrointestinal: Abdomen is distended with generalized tenderness throughout Musculoskeletal: No lower extremity tenderness nor edema. No gross deformities of extremities. Neurologic:  The patient is moving all 4 extremities but is in too much distress to participate in a neurological exam Skin:  Skin is warm except for his extremities which are cool; dry and intact. No rash noted.   ____________________________________________   LABS (all labs ordered are listed, but only abnormal results are displayed)  Labs Reviewed  LACTIC ACID, PLASMA - Abnormal; Notable for the following:       Result Value   Lactic Acid, Venous 17.4 (*)    All other components within normal limits  CBC WITH DIFFERENTIAL/PLATELET -  Abnormal; Notable for the following:    WBC 27.7 (*)    RBC 2.30 (*)    Hemoglobin 6.9 (*)    HCT 24.3 (*)    MCV 105.7 (*)    MCHC 28.3 (*)    RDW 22.3 (*)    Platelets 852 (*)    Neutro Abs 24.1 (*)    Lymphs Abs 0.8 (*)    Monocytes Absolute 2.5 (*)    Basophils Absolute 0.3 (*)    All other components within normal limits  BRAIN NATRIURETIC PEPTIDE - Abnormal; Notable for the following:    B Natriuretic Peptide >4,500.0 (*)    All other components within normal limits  AMMONIA - Abnormal; Notable for the following:    Ammonia 81 (*)    All other components within normal limits  ACETAMINOPHEN LEVEL - Abnormal; Notable for the following:    Acetaminophen (Tylenol), Serum <10 (*)    All other components within normal limits  PROTIME-INR - Abnormal; Notable for the following:    Prothrombin Time 41.4 (*)    INR 4.17 (*)    All other components within normal limits  COMPREHENSIVE METABOLIC PANEL - Abnormal; Notable for the following:    Potassium 7.2 (*)    CO2 <7 (*)    Glucose, Bld 121 (*)    BUN 53 (*)    Creatinine, Ser 3.63 (*)    Calcium 8.3 (*)    Albumin 3.1 (*)    AST 629 (*)    ALT 296 (*)    Alkaline Phosphatase 273 (*)    Total Bilirubin 3.3 (*)    GFR calc non Af Amer 16 (*)    GFR calc Af Amer 18 (*)    All other components within normal limits  TROPONIN I - Abnormal; Notable for the following:    Troponin I 0.06 (*)    All other components within normal limits  GLUCOSE, CAPILLARY - Abnormal; Notable for the following:    Glucose-Capillary 124 (*)    All other components within normal limits  CULTURE, BLOOD (ROUTINE X 2)  CULTURE, BLOOD (ROUTINE X 2)  GLUCOSE, CAPILLARY  ETHANOL  SALICYLATE LEVEL  LIPASE, BLOOD  PROCALCITONIN  MAGNESIUM  LACTIC ACID, PLASMA  URINALYSIS, COMPLETE (UACMP) WITH  MICROSCOPIC  URINE DRUG SCREEN, QUALITATIVE ([facility] ONLY)  TYPE AND SCREEN  PREPARE RBC (CROSSMATCH)    ____________________________________________  EKG  ED ECG REPORT I, Cristina Ceniceros, the attending physician, personally viewed and interpreted this ECG.  Date: 08/18/2016 EKG Time: 4:52 AM Rate: 68 Rhythm: Sinus rhythm QRS Axis: normal Intervals: Prolonged PR interval at 252 ms, prolonged QRS complex at 252 ms, QTC 614 ms.   ST/T Wave abnormalities: normal Conduction Disturbances: none Narrative Interpretation: Significantly changed from prior EKG about 3 weeks ago strongly suggestive of hyperkalemia  ____________________________________________  RADIOLOGY   Dg Abdomen 1 View  Result Date: 08/18/2016 CLINICAL DATA:  Status post central line placement EXAM: ABDOMEN - 1 VIEW COMPARISON:  None. FINDINGS: Scattered large and small bowel gas is noted. Stomach is distended with air. A a right femoral central line is noted with the tip just above the acetabulum on the right. Some scattered calcifications overlying the midline which may be related to the pancreas. No free air is seen. No bony abnormality is noted. Previous medullary rod placement in the right femur is noted. IMPRESSION: Central line as described. No abnormality is seen. Electronically Signed   By: Inez Catalina M.D.   On: 08/18/2016 08:00   Dg Chest Portable 1 View  Result Date: 08/18/2016 CLINICAL DATA:  Sepsis EXAM: PORTABLE CHEST 1 VIEW COMPARISON:  07/29/2016 FINDINGS: Cardiac shadow remains enlarged. Right jugular dialysis catheter is again seen in satisfactory position. The lungs are clear bilaterally. No acute bony abnormality is seen. IMPRESSION: No acute abnormality noted. Electronically Signed   By: Inez Catalina M.D.   On: 08/18/2016 08:00    ____________________________________________   PROCEDURES  Critical Care performed: Yes, see critical care procedure note(s)   Procedure(s) performed:   .Critical Care Performed by: Hinda Kehr Authorized by: Hinda Kehr   Critical care provider statement:     Critical care time (minutes):  75   Critical care time was exclusive of:  Separately billable procedures and treating other patients   Critical care was necessary to treat or prevent imminent or life-threatening deterioration of the following conditions:  Sepsis   Critical care was time spent personally by me on the following activities:  Development of treatment plan with patient or surrogate, discussions with consultants, evaluation of patient's response to treatment, examination of patient, obtaining history from patient or surrogate, ordering and performing treatments and interventions, ordering and review of laboratory studies, ordering and review of radiographic studies, pulse oximetry, re-evaluation of patient's condition and review of old charts .Central Line Date/Time: 08/18/2016 8:00 AM Performed by: Hinda Kehr Authorized by: Hinda Kehr   Consent:    Consent obtained:  Emergent situation Pre-procedure details:    Hand hygiene: Hand hygiene performed prior to insertion     Sterile barrier technique: All elements of maximal sterile technique followed     Skin preparation:  2% chlorhexidine   Skin preparation agent: Skin preparation agent completely dried prior to procedure   Anesthesia (see MAR for exact dosages):    Anesthesia method:  None Procedure details:    Location:  R femoral   Site selection rationale:  Already has dialysis catheter in right IJ   Patient position:  Flat   Procedural supplies:  Triple lumen   Landmarks identified: yes     Ultrasound guidance: yes     Sterile ultrasound techniques: Sterile gel and sterile probe covers were used     Number of attempts:  2   Successful placement: yes  Post-procedure details:    Post-procedure:  Dressing applied and line sutured   Assessment:  Blood return through all ports, no pneumothorax on x-ray, placement verified by x-ray and free fluid flow   Patient tolerance of procedure:  Tolerated well, no immediate  complications     ____________________________________________   INITIAL IMPRESSION / ASSESSMENT AND PLAN / ED COURSE  Pertinent labs & imaging results that were available during my care of the patient were reviewed by me and considered in my medical decision making (see chart for details).  The patient appears acutely ill and is altered.  He has numerous chronic medical issues with a verified DO NOT RESUSCITATE.  I verified in the EMR that he is a kidney patient with a dialysis catheter in his right IJ but he is not yet on dialysis.  However I am concerned about the possibility of aortic dissection given the fact that we cannot palpate radial pulses, he is complaining of chest and abdominal pain, and he has known vascular disease.  He currently has an appropriate peripheral IV in the left upper extremity that we can use for CTA.  I discussed the case with Dr. Gerilyn Nestle with radiology and explained my concerns and while I understand that it may cause permanent damage to his kidneys I feel that I must rule out an acute aortic dissection.  Dr. Gerilyn Nestle agreed.  However the first order of business must be to treat the hyperkalemia that I am certain must be present based on both his history and his EKG.  I compared his current EKG to an EKG obtained about 3 weeks ago and it shows marked QRS widening and prolongation of the PR interval.  I am treating aggressively with calcium gluconate 1 g IV, glucose, insulin, sodium bicarbonate, magnesium 2 g IV.  We will then assess with CTA chest/abd/pelvis.  Extensive additional labwork pending.   Clinical Course as of Aug 19 922  Wed Aug 18, 2016  0532 asdf  [CF]  2878 I was unable to document in real time due to the critical nature of this patient as well as 2 other critical patients in the department that I was managing at the same time.  In short, we were treating the patient for hyperkalemia as documented above with plans for CT angiogram chest abdomen  pelvis to rule out dissection.  During the treatment for hyperkalemia, we lost the only peripheral access available.  After numerous attempts by multiple nurses we were not able to establish another peripheral IV.  By that point some of his lab results were back which were most notable for a lactic acid greater than 17.  As presently the patient has remained hemodynamically stable throughout the process and his mental status has not changed which is altered and moaning but still verbal.  I then proceeded with a right femoral line since he already has a right IJ dialysis catheter (I was uncomfortable attempting to place a line in his left neck as well).  After one failed attempt with puncture but no dilation of the femoral artery, I applied direct pressure for several minutes and saw no sign of hematoma so I proceeded and successfully placed a right femoral triple-lumen catheter as documented in the procedure note.  At this point I am more concerned about infection and multisystem organ failure than specifically about dissection.  We are waiting the results of a chest x-ray and abdominal x-ray to evaluate for infection as well as line placement and I will  proceed with fluid resuscitation and empiric antibiotics.  Code substance has been called.  Other labs that are notable R hemoglobin of 6.9 and an elevated ammonia.  Cultures still need to be collected.  The patient also has a BNP of greater than 4500 but he is able to lie flat without any difficulty and is not short of breath with clear lungs.  I suspect this is all related to multisystem organ failure.  [CF]  0816 Potassium greater than 7 (in spite of earlier treatment).  Will treat medically again.  Spoke by phone with Dr. Mortimer Fries with the ICU as well as Dr. Candiss Norse with nephrology.  Patient needs to get to the ICU for  dialysis.  Canceling CTA for now.  [CF]  0831 Although I feel empiric antibiotics were appropriate, now with more information available, The patient  is noted to have a lactate>4. With the current information available to me, I don't think the patient is in septic shock. The lactate>4, is related to renal failure.  He needs emergent dialysis as a primary treatment.   [CF]  0901 Spoke by phone and in person with Dr. Tor Netters with the hospitalist service.  He is admitting to the ICU for emergent dialysis.  [CF]    Clinical Course User Index [CF] Hinda Kehr, MD    ____________________________________________  FINAL CLINICAL IMPRESSION(S) / ED DIAGNOSES  Final diagnoses:  Acute renal failure superimposed on stage 4 chronic kidney disease, unspecified acute renal failure type ([place])  Altered mental status, unspecified altered mental status type  Increased ammonia level  Elevated lactic acid level  Elevated troponin I level  Elevated INR     MEDICATIONS GIVEN DURING THIS VISIT:  Medications  calcium gluconate 1 g in sodium chloride 0.9 % 100 mL IVPB (1 g Intravenous Not Given 08/18/16 0530)  magnesium sulfate IVPB 2 g 50 mL (2 g Intravenous New Bag/Given 08/18/16 0826)  sodium chloride 0.9 % bolus 1,000 mL (1,000 mLs Intravenous New Bag/Given 08/18/16 0808)    And  sodium chloride 0.9 % bolus 1,000 mL (1,000 mLs Intravenous New Bag/Given 08/18/16 0838)    And  sodium chloride 0.9 % bolus 500 mL (not administered)  0.9 %  sodium chloride infusion (not administered)  morphine 4 MG/ML injection 4 mg (4 mg Intravenous Given 08/18/16 0553)  dextrose 50 % solution 50 mL (50 mLs Intravenous Given 08/18/16 0545)  insulin aspart (novoLOG) injection 5 Units (5 Units Intravenous Given 08/18/16 0542)  sodium bicarbonate injection 50 mEq (50 mEq Intravenous Given 08/18/16 0553)  calcium gluconate 10 % injection (1,000 mg  Given 08/18/16 0550)  sodium bicarbonate injection 50 mEq (50 mEq Intravenous Given 08/18/16 0814)  piperacillin-tazobactam (ZOSYN) IVPB 3.375 g (0 g Intravenous Stopped 08/18/16 0839)  vancomycin (VANCOCIN) IVPB 1000 mg/200 mL premix (1,000 mg  Intravenous New Bag/Given 08/18/16 0809)  calcium gluconate inj 10% (1 g) URGENT USE ONLY! (1 g Intravenous Given 08/18/16 0916)  dextrose 50 % solution 50 mL (50 mLs Intravenous Given 08/18/16 0845)  insulin aspart (novoLOG) injection 5 Units (5 Units Intravenous Given 08/18/16 0844)  sodium bicarbonate injection 50 mEq (50 mEq Intravenous Given 08/18/16 0858)     NEW OUTPATIENT MEDICATIONS STARTED DURING THIS VISIT:  New Prescriptions   No medications on file    Modified Medications   No medications on file    Discontinued Medications   No medications on file     Note:  This document was prepared using Dragon voice recognition software and may  include unintentional dictation errors.    Hinda Kehr, MD 08/18/16 3790    Hinda Kehr, MD 08/18/16 [phone]

## 2016-08-18 NOTE — Progress Notes (Signed)
Chaplain was making rounds and visited with pt in [place]. Pt was unconscious. Chaplain spent some time sitting in the room. Provided the ministry of silent prayer for pt.    08/18/16 1100  Clinical Encounter Type  Visited With Patient  Visit Type Initial;Spiritual support  Referral From Nurse  Consult/Referral To Chaplain  Spiritual Encounters  Spiritual Needs Prayer

## 2016-08-18 NOTE — Progress Notes (Signed)
CVC not functioning. Stat K+ drawn and overnight TPA dwell ordered via Candiss Norse MD. Primary RN present and aware.

## 2016-08-19 DIAGNOSIS — E875 Hyperkalemia: Secondary | ICD-10-CM

## 2016-08-19 DIAGNOSIS — R945 Abnormal results of liver function studies: Secondary | ICD-10-CM

## 2016-08-19 DIAGNOSIS — R4182 Altered mental status, unspecified: Secondary | ICD-10-CM

## 2016-08-19 LAB — COMPREHENSIVE METABOLIC PANEL
ALBUMIN: 3 g/dL — AB (ref 3.5–5.0)
ALK PHOS: 245 U/L — AB (ref 38–126)
ALT: 1401 U/L — ABNORMAL HIGH (ref 17–63)
AST: 3516 U/L — AB (ref 15–41)
Anion gap: 13 (ref 5–15)
BUN: 54 mg/dL — AB (ref 6–20)
CALCIUM: 7.1 mg/dL — AB (ref 8.9–10.3)
CO2: 21 mmol/L — ABNORMAL LOW (ref 22–32)
CREATININE: 3.55 mg/dL — AB (ref 0.61–1.24)
Chloride: 104 mmol/L (ref 101–111)
GFR calc Af Amer: 19 mL/min — ABNORMAL LOW (ref >60)
GFR, EST NON AFRICAN AMERICAN: 16 mL/min — AB (ref >60)
GLUCOSE: 86 mg/dL (ref 65–99)
Potassium: 5.1 mmol/L (ref 3.5–5.1)
Sodium: 138 mmol/L (ref 135–145)
Total Bilirubin: 4 mg/dL — ABNORMAL HIGH (ref 0.3–1.2)
Total Protein: 6 g/dL — ABNORMAL LOW (ref 6.5–8.1)

## 2016-08-19 LAB — PROCALCITONIN: Procalcitonin: 17.04 ng/mL

## 2016-08-19 LAB — TROPONIN I: Troponin I: 0.28 ng/mL (ref <0.03)

## 2016-08-19 LAB — CBC
HCT: 22.5 % — ABNORMAL LOW (ref 40.0–52.0)
Hemoglobin: 7.2 g/dL — ABNORMAL LOW (ref 13.0–18.0)
MCH: 28.3 pg (ref 26.0–34.0)
MCHC: 32.1 g/dL (ref 32.0–36.0)
MCV: 88.1 fL (ref 80.0–100.0)
Platelets: 575 10*3/uL — ABNORMAL HIGH (ref 150–440)
RBC: 2.55 MIL/uL — AB (ref 4.40–5.90)
RDW: 19.5 % — ABNORMAL HIGH (ref 11.5–14.5)
WBC: 29 10*3/uL — AB (ref 3.8–10.6)

## 2016-08-19 LAB — HEPATITIS B CORE ANTIBODY, IGM: HEP B C IGM: NEGATIVE

## 2016-08-19 LAB — EBV AB TO VIRAL CAPSID AG PNL, IGG+IGM
EBV VCA IgG: 100 U/mL — ABNORMAL HIGH (ref 0.0–17.9)
EBV VCA IgM: 70.7 U/mL — ABNORMAL HIGH (ref 0.0–35.9)

## 2016-08-19 LAB — HEPATITIS B E ANTIGEN: HEP B E AG: NEGATIVE

## 2016-08-19 LAB — HEPATITIS A ANTIBODY, IGM: HEP A IGM: NEGATIVE

## 2016-08-19 LAB — HSV(HERPES SIMPLEX VRS) I + II AB-IGM: HSVI/II Comb IgM: <0.91 Ratio (ref 0.00–0.90)

## 2016-08-19 LAB — HEPATITIS PANEL, ACUTE
HEP B C IGM: NEGATIVE
HEP B S AG: NEGATIVE
Hep A IgM: NEGATIVE

## 2016-08-19 LAB — PROTIME-INR
INR: 2.85
PROTHROMBIN TIME: 30.5 s — AB (ref 11.4–15.2)

## 2016-08-19 LAB — HIV ANTIBODY (ROUTINE TESTING W REFLEX): HIV Screen 4th Generation wRfx: NONREACTIVE

## 2016-08-19 MED ORDER — DEXTROSE 5 % IV SOLN
2.0000 g | Freq: Every day | INTRAVENOUS | Status: DC
  Administered 2016-08-19 – 2016-08-21 (×3): 2 g via INTRAVENOUS
  Filled 2016-08-19 (×4): qty 2

## 2016-08-19 NOTE — Progress Notes (Signed)
Patient tolerating dialysis at this time- alert and oriented. NSR on monitor.  No complaint of pain- US abdominal doppler rescheduled for tomorrow due to patient having 1 sip of milk- per ultrasound.  Patient NPO after midnight tonight.

## 2016-08-19 NOTE — Progress Notes (Signed)
UF goal met, NET 2,500 ml, no c/o, VSS, A&O.

## 2016-08-19 NOTE — NC FL2 (Signed)
[place] LEVEL OF CARE SCREENING TOOL     IDENTIFICATION  Patient Name: LYNDA CAPISTRAN Birthdate: 04-13-1945 Sex: male Admission Date (Current Location): 08/18/2016  [facility] and [place] Number:  Engineering geologist and Address:  [facility], [address], [place], [place] [address]      Provider Number: [id number]  Attending Physician Name and Address:  Henreitta Leber, MD  Relative Name and Phone Number:       Current Level of Care: Hospital Recommended Level of Care: [place] Prior Approval Number:    Date Approved/Denied:   PASRR Number:    Discharge Plan: SNF    Current Diagnoses: Patient Active Problem List   Diagnosis Date Noted  . Hyperkalemia 08/18/2016  . Anemia   . Palliative care by specialist   . Goals of care, counseling/discussion   . Advance care planning   . Adjustment disorder with mixed anxiety and depressed mood 08/02/2016  . Melena   . Acute on chronic systolic CHF (congestive heart failure) ([place]) 05/07/2016  . Unsteady gait 05/07/2016  . Atypical chest pain 05/05/2016  . Cardiomyopathy 05/05/2016  . Atrial arrhythmia 05/05/2016  . Muscle weakness (generalized)   . Thrombocytosis ([place])   . Chest pain 05/02/2016  . Near syncope 05/02/2016  . Acute respiratory distress 03/22/2016  . Homeless 03/22/2016  . Slurred speech 03/22/2016  . History of stroke 03/22/2016  . Elevated lactic acid level 10/11/2015  . Systolic congestive heart failure ([place]) 10/10/2015  . CKD (chronic kidney disease), stage II   . Dizziness 01/25/2015  . Acute on chronic combined systolic and diastolic CHF, NYHA class 3 ([place]) 01/25/2015  . Essential hypertension 01/25/2015  . Compliance poor 01/25/2015  . Coronary artery disease, non-occlusive   . H/O: stroke with residual effects 09/13/2014  . Transaminitis 09/13/2014  . Elevated troponin 09/13/2014  . TIA (transient ischemic attack) 05/10/2014  . CML  (chronic myelocytic leukemia) ([place]) 03/27/2014  . Leukocytosis 03/18/2014    Orientation RESPIRATION BLADDER Height & Weight     Self, Place, Time, Situation  Normal Continent Weight: 174 lb 2.6 oz (79 kg) Height:  5\' 8"  (172.7 cm)  BEHAVIORAL SYMPTOMS/MOOD NEUROLOGICAL BOWEL NUTRITION STATUS   (none)  (none) Continent Diet (renal/carb modified)  AMBULATORY STATUS COMMUNICATION OF NEEDS Skin   Extensive Assist Verbally Normal                       Personal Care Assistance Level of Assistance  Bathing, Feeding, Dressing Bathing Assistance: Limited assistance Feeding assistance: Limited assistance Dressing Assistance: Limited assistance     Functional Limitations Info  Hearing   Hearing Info: Impaired      SPECIAL CARE FACTORS FREQUENCY  PT (By licensed PT)                    Contractures Contractures Info: Not present    Additional Factors Info  Code Status, Allergies Code Status Info: dnr Allergies Info: nka           Current Medications (08/19/2016):  This is the current hospital active medication list Current Facility-Administered Medications  Medication Dose Route Frequency Provider Last Rate Last Dose  . acetaminophen (TYLENOL) tablet 650 mg  650 mg Oral Q6H PRN Henreitta Leber, MD       Or  . acetaminophen (TYLENOL) suppository 650 mg  650 mg Rectal Q6H PRN Henreitta Leber, MD      . calcium  gluconate 1 g in sodium chloride 0.9 % 100 mL IVPB  1 g Intravenous Once Hinda Kehr, MD      . ceFEPIme (MAXIPIME) 2 g in dextrose 5 % 50 mL IVPB  2 g Intravenous q1800 Christy, Scott D, RPH      . ipratropium-albuterol (DUONEB) 0.5-2.5 (3) MG/3ML nebulizer solution 3 mL  3 mL Nebulization Q6H PRN Awilda Bill, NP      . MEDLINE mouth rinse  15 mL Mouth Rinse BID Flora Lipps, MD   15 mL at 08/19/16 1253  . ondansetron (ZOFRAN) tablet 4 mg  4 mg Oral Q6H PRN Henreitta Leber, MD       Or  . ondansetron (ZOFRAN) injection 4 mg  4 mg Intravenous Q6H  PRN Henreitta Leber, MD      . phytonadione (VITAMIN K) SQ injection 10 mg  10 mg Subcutaneous Daily [name], MD   10 mg at 08/19/16 1046  . senna-docusate (Senokot-S) tablet 1 tablet  1 tablet Oral QHS PRN Awilda Bill, NP      . tiotropium (SPIRIVA) inhalation capsule 18 mcg  18 mcg Inhalation q morning - 10a Awilda Bill, NP   18 mcg at 08/19/16 1047     Discharge Medications: Please see discharge summary for a list of discharge medications.  Relevant Imaging Results:  Relevant Lab Results:   Additional Information    Shela Leff, LCSW

## 2016-08-19 NOTE — Consult Note (Signed)
Name: Bruce [name] MRN: [id number] DOB: 1945/07/09    ADMISSION DATE:  08/18/2016 CONSULTATION DATE:  08/18/2016  REFERRING MD :  Dr. [place] Carmine  CHIEF COMPLAINT:  Hypoglycemia and AMS  BRIEF PATIENT DESCRIPTION:  71 yo male admitted 05/9 with acute on chronic hypoxic respiratory failure secondary to volume overload due to combined diastolic and systolic CHF, acute renal failure with hyperkalemia, lactic acidosis, elevated liver enzymes, ?septic shock with unknown etiology, acute encephalopathy secondary to metabolic acidosis and hypoglycemia   SIGNIFICANT EVENTS  05/9-Pt admitted to ICU requiring emergent HD   STUDIES:  CT Head 05/9>>Diffuse cerebral atrophy.  No acute intracranial abnormality  HISTORY OF PRESENT ILLNESS:   This is a 71 yo male with a PMH of TIA, Stroke (no residual effects), Chronic myelocytic leukemia, HTN, Hypercholesterolemia, CAD, Chronic combined systolic and diastolic CHF, Bell's Palsy, and CKD stage II (previously dialyzed previous hospitalization in April 2018 but renal function recovered received no outpatient hemodialysis right chest dialysis permcath placed 07/29/16).  He presented to [facility] ER 05/9 from [facility] with altered mental status, shortness of breath, chest and abdominal pain, and hypoglycemia with CBG <20 he was given glucagon x3 doses.  Per ER notes pt arrived via EMS confused upon arrival lab results revealed K+ 7.2, Creat 3.63, Alk Phos 273, AST 629, ALT 296, Lactic acid 17.4, BNP >4,500, PCT 7.93, Serum glucose 121, WBC 27.7, hgb 6.9, Ammonia 81, PT 41.4, INR 4.17, and pt hypothermic temp 94.1 F. Due to lab results he met sepsis criteria he was given 2.5L NS fluid bolus in the ER and empiric abx.  He was subsequently admitted by hospitalist team 05/9 to ICU for further workup and treatment Nephrology consulted for emergent hemodialysis 05/9.   SUBJECTIVE Remains on high flow [place] 70% Alert but confused On HD now  REVIEW OF SYSTEMS:   Positives in BOLD  Constitutional: Negative for fever, chills, weight loss, malaise/fatigue and diaphoresis.  HENT: Negative for hearing loss, ear pain, nosebleeds, congestion, sore throat, neck pain, tinnitus and ear discharge.   Eyes: Negative for blurred vision, double vision, photophobia, pain, discharge and redness.  Respiratory: -cough, - -sputum production, -shortness of breath, -wheezing Cardiovascular: -chest pain, palpitations, orthopnea, claudication, leg swelling and PND.   - VITAL SIGNS: Temp:  [94.1 F (34.5 C)-100.4 F (38 C)] 99.1 F (37.3 C) (05/10 0715) Pulse Rate:  [62-77] 74 (05/10 0715) Resp:  [9-28] 19 (05/10 0715) BP: (93-130)/(35-86) 108/55 (05/10 0715) SpO2:  [92 %-100 %] 100 % (05/10 0715) FiO2 (%):  [50 %-58 %] 58 % (05/09 2051) Weight:  [179 lb 10.8 oz (81.5 kg)] 179 lb 10.8 oz (81.5 kg) (05/10 0658)  PHYSICAL EXAMINATION: General:NAD Neuro: alert and oriented, follows commands, PERRLA HEENT: supple, JVD present  Cardiovascular: nsr, s1s2, no M/R/G  Lungs: faint crackles bilateral bases, diminished throughout, even, non labored on nonrebreather  Abdomen: +BS x4, soft, tender, non distended  Musculoskeletal: moves all extremities, 2+ bilateral lower extremity pitting edema  Skin: intact no rashes or lesions    Recent Labs Lab 08/18/16 0726 08/18/16 1547 08/18/16 2248 08/19/16 0425  NA 135  --   --  138  K 7.2* 6.2* 5.0 5.1  CL 105  --   --  104  CO2 <7*  --   --  21*  BUN 53*  --   --  54*  CREATININE 3.63*  --   --  3.55*  GLUCOSE 121*  --   --  86  Recent Labs Lab 08/18/16 0518 08/18/16 2124 08/19/16 0515  HGB 6.9* 6.5* 7.2*  HCT 24.3*  --  22.5*  WBC 27.7*  --  29.0*  PLT 852*  --  575*    ASSESSMENT / PLAN: Acute encephalopathy secondary to metabolic acidosis and hypoglycemia-slowly improving Acute on chronic renal failure with severe hyperkalemia  Septic shock no clear source of infection at this time Acute on chronic  hypoxic respiratory failure secondary to volume overload due to combined systolic and diastolic CHF Liver failure Chronic Leukocytosis  Chronic Anemia without acute blood loss  P: Supplemental O2 to maintain O2 sats >92%-wean fio2 as tolerated Prn bronchodilator therapy  Nephrology consulted appreciate input  Replace electrolytes as indicated Monitor UOP Continuous telemetry monitoring  Transfuse as needed SCD's for VTE prophylaxis, no chemical VTE prophylaxis  Monitor for s/sx of bleeding [place] Abd Limited RUQ pending  Hepatitis panel pending Gastroenterology consulted appreciate input May need CT Abd and Pelvis once able to tolerate po contrast  Trend WBC and monitor fever curve Trend PCT and lactic acid Follow cultures Continue empiric abx for now  Avoid sedating and nephrotoxic medications  Remains Step down status Patient is DNR/DNI  Corrin Parker, M.D.  [facility]  Medical Director [place] Director [place] Department

## 2016-08-19 NOTE — Progress Notes (Signed)
[name] MD [address] [place], [place] [address] Phone: [phone] Fax : [phone]  LATREL SZYMCZAK is being followed for acute liver failure  Day 2 of follow up   Subjective: Feels better , more alert and orientated.    Objective: Vital signs in last 24 hours: Vitals:   08/19/16 0930 08/19/16 0945 08/19/16 1000 08/19/16 1013  BP: (!) 119/47 (!) 110/47 (!) 108/49 (!) 108/49  Pulse:    66  Resp: 16 16 (!) 23 19  Temp: 98.4 F (36.9 C) 98.2 F (36.8 C) 98.4 F (36.9 C) 98.2 F (36.8 C)  TempSrc:      SpO2:    100%  Weight:    174 lb 2.6 oz (79 kg)  Height:       Weight change: 8 lb 10.8 oz (3.935 kg)  Intake/Output Summary (Last 24 hours) at 08/19/16 1238 Last data filed at 08/19/16 1013  Gross per 24 hour  Intake              975 ml  Output             3350 ml  Net            -2375 ml     Exam: Heart:: Regular rate and rhythm, J8H6 ,systolic murmur heard  Lungs: normal Abdomen: soft, nontender, normal bowel sounds CNS- alert and orientated x3 , moving all 4 limbs  Lab Results: Hepatic Function Latest Ref Rng & Units 08/19/2016 08/18/2016 08/01/2016  Total Protein 6.5 - 8.1 g/dL 6.0(L) 6.6 -  Albumin 3.5 - 5.0 g/dL 3.0(L) 3.1(L) 2.5(L)  AST 15 - 41 U/L 3,516(H) 629(H) -  ALT 17 - 63 U/L 1,401(H) 296(H) -  Alk Phosphatase 38 - 126 U/L 245(H) 273(H) -  Total Bilirubin 0.3 - 1.2 mg/dL 4.0(H) 3.3(H) -  Bilirubin, Direct 0.1 - 0.5 mg/dL - - -    Micro Results: Recent Results (from the past 240 hour(s))  Culture, blood (routine x 2)     Status: None (Preliminary result)   Collection Time: 08/18/16  7:25 AM  Result Value Ref Range Status   Specimen Description BLOOD R FEM  Final   Special Requests   Final    BOTTLES DRAWN AEROBIC AND ANAEROBIC Blood Culture adequate volume   Culture NO GROWTH < 24 HOURS  Final   Report Status PENDING  Incomplete  Culture, blood (routine x 2)     Status: None (Preliminary result)   Collection Time: 08/18/16   7:25 AM  Result Value Ref Range Status   Specimen Description BLOOD R FEM  Final   Special Requests   Final    BOTTLES DRAWN AEROBIC AND ANAEROBIC Blood Culture adequate volume   Culture NO GROWTH < 24 HOURS  Final   Report Status PENDING  Incomplete  MRSA PCR Screening     Status: None   Collection Time: 08/18/16 10:39 AM  Result Value Ref Range Status   MRSA by PCR NEGATIVE NEGATIVE Final    Comment:        The GeneXpert MRSA Assay (FDA approved for NASAL specimens only), is one component of a comprehensive MRSA colonization surveillance program. It is not intended to diagnose MRSA infection nor to guide or monitor treatment for MRSA infections.   Culture, blood (routine x 2)     Status: None (Preliminary result)   Collection Time: 08/18/16 11:04 AM  Result Value Ref Range Status   Specimen Description BLOOD R AC  Final  Special Requests   Final    BOTTLES DRAWN AEROBIC AND ANAEROBIC Blood Culture adequate volume   Culture NO GROWTH < 24 HOURS  Final   Report Status PENDING  Incomplete  Culture, blood (routine x 2)     Status: None (Preliminary result)   Collection Time: 08/18/16 11:04 AM  Result Value Ref Range Status   Specimen Description BLOOD R HAND  Final   Special Requests   Final    BOTTLES DRAWN AEROBIC AND ANAEROBIC Blood Culture adequate volume   Culture NO GROWTH < 24 HOURS  Final   Report Status PENDING  Incomplete   Studies/Results: Dg Abdomen 1 View  Result Date: 08/18/2016 CLINICAL DATA:  Status post central line placement EXAM: ABDOMEN - 1 VIEW COMPARISON:  None. FINDINGS: Scattered large and small bowel gas is noted. Stomach is distended with air. A a right femoral central line is noted with the tip just above the acetabulum on the right. Some scattered calcifications overlying the midline which may be related to the pancreas. No free air is seen. No bony abnormality is noted. Previous medullary rod placement in the right femur is noted. IMPRESSION:  Central line as described. No abnormality is seen. Electronically Signed   By: Inez Catalina M.D.   On: 08/18/2016 08:00   Ct Head Wo Contrast  Result Date: 08/18/2016 CLINICAL DATA:  Altered mental status EXAM: CT HEAD WITHOUT CONTRAST TECHNIQUE: Contiguous axial images were obtained from the base of the skull through the vertex without intravenous contrast. COMPARISON:  05/02/2016 FINDINGS: Brain: Mild diffuse cerebral atrophy. No acute intracranial abnormality. Specifically, no hemorrhage, hydrocephalus, mass lesion, acute infarction, or significant intracranial injury. Vascular: No hyperdense vessel or unexpected calcification. Skull: No acute calvarial abnormality. Sinuses/Orbits: Visualized paranasal sinuses and mastoids clear. Orbital soft tissues unremarkable. Old bilateral medial orbital wall blowout fractures noted, stable. Other: None IMPRESSION: Diffuse cerebral atrophy.  No acute intracranial abnormality. Electronically Signed   By: Rolm Baptise M.D.   On: 08/18/2016 10:11   Dg Chest Portable 1 View  Result Date: 08/18/2016 CLINICAL DATA:  Sepsis EXAM: PORTABLE CHEST 1 VIEW COMPARISON:  07/29/2016 FINDINGS: Cardiac shadow remains enlarged. Right jugular dialysis catheter is again seen in satisfactory position. The lungs are clear bilaterally. No acute bony abnormality is seen. IMPRESSION: No acute abnormality noted. Electronically Signed   By: Inez Catalina M.D.   On: 08/18/2016 08:00   US Abdomen Limited Ruq  Result Date: 08/18/2016 CLINICAL DATA:  Elevated LFTs EXAM: US ABDOMEN LIMITED - RIGHT UPPER QUADRANT COMPARISON:  None. FINDINGS: Gallbladder: Gallbladder wall thickening is noted to 5 mm. No definitive cholelithiasis or pericholecystic fluid is seen. Common bile duct: Diameter: 3.9 mm. Liver: No focal lesion identified. Within normal limits in parenchymal echogenicity. There is 8 degree of by directionality in the portal vein. This may represent some very early portal hypertension. Note  is made of minimal free fluid surrounding the liver. This may contribute to the degree of gallbladder wall thickening. Mild increased echogenicity in the kidney is noted as well. IMPRESSION: Gallbladder wall thickening without cholelithiasis. This may be related to the incomplete distention or possibly related to the minimal amount of free fluid surrounding the liver. Increased echogenicity in the right kidney. Suggestion of early portal hypertension. Electronically Signed   By: Inez Catalina M.D.   On: 08/18/2016 14:27   Medications: I have reviewed the patient's current medications. Scheduled Meds: . mouth rinse  15 mL Mouth Rinse BID  . phytonadione  10  mg Subcutaneous Daily  . tiotropium  18 mcg Inhalation q morning - 10a   Continuous Infusions: . calcium gluconate 1 GM IV    . ceFEPime (MAXIPIME) IV     PRN Meds:.acetaminophen **OR** acetaminophen, ipratropium-albuterol, ondansetron **OR** ondansetron (ZOFRAN) IV, senna-docusate   Assessment: Active Problems:   Hyperkalemia ZIMERE DUNLEVY is a 71 y.o. y/o male with multiple co morbidities here in the ICU with respiratory failure, sepsis, renal failure, fluid overload. He has  acute liver failure - likely seconadary to heart failure/SEPSIS/ischemic/Amiodarone( has been d/c)  , presently has multiorgan failure with  very poor prognosis. He  is encephelopathic which could be a combination from metabolic issues and liver failure . INR today has improved whereas transaminases have risen .    Plan  1. Vitamin K s.c 10 mg Q daily for 3 days  2. Acute viral hepatitis screen including EBV,CMV,HSV,VZV in process to be followed up  3. Daily INR 4. Supportive care with management of fluid status and optimizing heart failure. .  5. F/u USG doppler of liver vasculature  7. Not a candidate for liver transplant due to kidney failure and heart failure. 8.hepatic encephalopathy - mLactulose to titrate to 2 soft bowel movements a day - avoid  diarrhea      LOS: 1 day   [name] 08/19/2016, 12:38 PM

## 2016-08-19 NOTE — Care Management (Signed)
Patient transferred out of icu to [place] this day.  He was receiving palliative care services at [facility]. He was received intermittent dialysis during last admission.  he had a perm cath inserted during that admission. He is admission in severe fluid overload and required emergent dialysis.

## 2016-08-19 NOTE — Progress Notes (Signed)
Pharmacy Antibiotic Note  Bruce [name] is a 71 y.o. male with a h/o CML and CHF admitted on 08/18/2016 with acute respiratory failure, possible sepsis of unknown source.  Pharmacy has been consulted for cefepime dosing. Patient received initial doses of vancomycin and Zosyn.   Plan: Will increase cefepime to 2 g iv q 24 hours.   Height: 5\' 8"  (172.7 cm) Weight: 174 lb 2.6 oz (79 kg) IBW/kg (Calculated) : 68.4  Temp (24hrs), Avg:99.1 F (37.3 C), Min:95.5 F (35.3 C), Max:100.4 F (38 C)   Recent Labs Lab 08/18/16 0518 08/18/16 0724 08/18/16 0726 08/18/16 1423 08/18/16 1741 08/19/16 0425 08/19/16 0515  WBC 27.7*  --   --   --   --   --  29.0*  CREATININE  --   --  3.63*  --   --  3.55*  --   LATICACIDVEN 17.4* 15.9*  --  8.6* 5.5*  --   --     Estimated Creatinine Clearance: 18.7 mL/min (A) (by C-G formula based on SCr of 3.55 mg/dL (H)).    No Known Allergies  Antimicrobials this admission: Zosyn and  vancomycin >> x 1 cefepime 5/9 >>   Dose adjustments this admission:   Microbiology results: 5/9 BCx: NGTD 5/9 MRSA PCR: negative  Thank you for allowing pharmacy to be a part of this patient's care.  Ulice Dash D 08/19/2016 11:53 AM

## 2016-08-19 NOTE — Progress Notes (Signed)
PT Cancellation Note  Patient Details Name: IKECHUKWU CERNY MRN: [id number] DOB: 1945/09/30   Cancelled Treatment:    Reason Eval/Treat Not Completed: Medical issues which prohibited therapy Pt with uptrending troponins, HGB of 7.2 and other lab work issues.  Will hold PT today and check back tomorrow.   Kreg Shropshire. DPT 08/19/2016, 3:09 PM

## 2016-08-19 NOTE — Progress Notes (Signed)
[facility] NAME: Bruce [name]    MR#:  [id number]  DATE OF BIRTH:  November 24, 1945  SUBJECTIVE:   Patient admitted yesterday due to altered mental status/encephalopathy and also noted to be volume overloaded and acute and chronic kidney injury with severe hyperkalemia. Also noted to have acute liver failure with abnormal LFTs, elevated INR.  Mental status improved today and potassium normalized w/ HD.   REVIEW OF SYSTEMS:    Review of Systems  Constitutional: Negative for chills and fever.  HENT: Negative for congestion and tinnitus.   Eyes: Negative for blurred vision and double vision.  Respiratory: Negative for cough, shortness of breath and wheezing.   Cardiovascular: Negative for chest pain, orthopnea and PND.  Gastrointestinal: Negative for abdominal pain, diarrhea, nausea and vomiting.  Genitourinary: Negative for dysuria and hematuria.  Neurological: Positive for weakness. Negative for dizziness, sensory change and focal weakness.  All other systems reviewed and are negative.   Nutrition: Renal diet Tolerating Diet: Yes Tolerating PT:  Await Eval.    DRUG ALLERGIES:  No Known Allergies  VITALS:  Blood pressure (!) 107/55, pulse 66, temperature 98.6 F (37 C), resp. rate 19, height 5\' 8"  (1.727 m), weight 79 kg (174 lb 2.6 oz), SpO2 100 %.  PHYSICAL EXAMINATION:   Physical Exam  GENERAL:  71 y.o.-year-old patient lying in bed in no acute distress.  EYES: Pupils equal, round, reactive to light and accommodation. No scleral icterus. Extraocular muscles intact.  HEENT: Head atraumatic, normocephalic. Oropharynx and nasopharynx clear.  NECK:  Supple, no jugular venous distention. No thyroid enlargement, no tenderness.  LUNGS: Normal breath sounds bilaterally, no wheezing, rales, rhonchi. No use of accessory muscles of respiration.  CARDIOVASCULAR: S1, S2 normal. No murmurs, rubs, or gallops.  ABDOMEN: Soft,  nontender, nondistended. Bowel sounds present. No organomegaly or mass.  EXTREMITIES: No cyanosis, clubbing or edema b/l.    NEUROLOGIC: Cranial nerves II through XII are intact. No focal Motor or sensory deficits b/l.  Globally weak PSYCHIATRIC: The patient is alert and oriented x 2.  SKIN: No obvious rash, lesion, or ulcer.   Right chest wall Perm cath in place.   LABORATORY PANEL:   CBC  Recent Labs Lab 08/19/16 0515  WBC 29.0*  HGB 7.2*  HCT 22.5*  PLT 575*   ------------------------------------------------------------------------------------------------------------------  Chemistries   Recent Labs Lab 08/18/16 0726  08/19/16 0425  NA 135  --  138  K 7.2*  < > 5.1  CL 105  --  104  CO2 <7*  --  21*  GLUCOSE 121*  --  86  BUN 53*  --  54*  CREATININE 3.63*  --  3.55*  CALCIUM 8.3*  --  7.1*  MG 2.2  --   --   AST 629*  --  3,516*  ALT 296*  --  1,401*  ALKPHOS 273*  --  245*  BILITOT 3.3*  --  4.0*  < > = values in this interval not displayed. ------------------------------------------------------------------------------------------------------------------  Cardiac Enzymes  Recent Labs Lab 08/19/16 0337  TROPONINI 0.28*   ------------------------------------------------------------------------------------------------------------------  RADIOLOGY:  Dg Abdomen 1 View  Result Date: 08/18/2016 CLINICAL DATA:  Status post central line placement EXAM: ABDOMEN - 1 VIEW COMPARISON:  None. FINDINGS: Scattered large and small bowel gas is noted. Stomach is distended with air. A a right femoral central line is noted with the tip just above the acetabulum on the right. Some scattered calcifications overlying the midline  which may be related to the pancreas. No free air is seen. No bony abnormality is noted. Previous medullary rod placement in the right femur is noted. IMPRESSION: Central line as described. No abnormality is seen. Electronically Signed   By: Inez Catalina  M.D.   On: 08/18/2016 08:00   Ct Head Wo Contrast  Result Date: 08/18/2016 CLINICAL DATA:  Altered mental status EXAM: CT HEAD WITHOUT CONTRAST TECHNIQUE: Contiguous axial images were obtained from the base of the skull through the vertex without intravenous contrast. COMPARISON:  05/02/2016 FINDINGS: Brain: Mild diffuse cerebral atrophy. No acute intracranial abnormality. Specifically, no hemorrhage, hydrocephalus, mass lesion, acute infarction, or significant intracranial injury. Vascular: No hyperdense vessel or unexpected calcification. Skull: No acute calvarial abnormality. Sinuses/Orbits: Visualized paranasal sinuses and mastoids clear. Orbital soft tissues unremarkable. Old bilateral medial orbital wall blowout fractures noted, stable. Other: None IMPRESSION: Diffuse cerebral atrophy.  No acute intracranial abnormality. Electronically Signed   By: Rolm Baptise M.D.   On: 08/18/2016 10:11   Dg Chest Portable 1 View  Result Date: 08/18/2016 CLINICAL DATA:  Sepsis EXAM: PORTABLE CHEST 1 VIEW COMPARISON:  07/29/2016 FINDINGS: Cardiac shadow remains enlarged. Right jugular dialysis catheter is again seen in satisfactory position. The lungs are clear bilaterally. No acute bony abnormality is seen. IMPRESSION: No acute abnormality noted. Electronically Signed   By: Inez Catalina M.D.   On: 08/18/2016 08:00   US Abdomen Limited Ruq  Result Date: 08/18/2016 CLINICAL DATA:  Elevated LFTs EXAM: US ABDOMEN LIMITED - RIGHT UPPER QUADRANT COMPARISON:  None. FINDINGS: Gallbladder: Gallbladder wall thickening is noted to 5 mm. No definitive cholelithiasis or pericholecystic fluid is seen. Common bile duct: Diameter: 3.9 mm. Liver: No focal lesion identified. Within normal limits in parenchymal echogenicity. There is 8 degree of by directionality in the portal vein. This may represent some very early portal hypertension. Note is made of minimal free fluid surrounding the liver. This may contribute to the degree of  gallbladder wall thickening. Mild increased echogenicity in the kidney is noted as well. IMPRESSION: Gallbladder wall thickening without cholelithiasis. This may be related to the incomplete distention or possibly related to the minimal amount of free fluid surrounding the liver. Increased echogenicity in the right kidney. Suggestion of early portal hypertension. Electronically Signed   By: Inez Catalina M.D.   On: 08/18/2016 14:27     ASSESSMENT AND PLAN:   71 year old male with past medical history of chronic myelogenous leukemia, chronic kidney disease stage III, hypertension, history of coronary artery disease, chronic combined systolic diastolic CHF who presents to the hospital due to altered mental status.  1. Altered mental status-metabolic encephalopathy secondary to lactic acidosis, volume overload and worsening renal failure. -Patient to get emergent hemodialysis due to his severe hyperkalemia and worsening renal function.  -CT head negative for acute pathology. Mental status much improved after hemodialysis and as the patient's acidosis has corrected.  2. Acute respiratory failure with hypoxia-secondary to volume overload from combined diastolic and systolic CHF and worsening renal failure. -Patient is currently on high flow nasal cannula at 40% and O2 sats is stable. Status post hemodialysis and volume status has improved.  3. Hyperkalemia-secondary to acute on chronic renal failure.  - he got insulin, D50 and calcium gluconate in the ER.  -Patient emergent hemodialysis yesterday and potassium has now improved. We'll follow electrolytes.  4. Acute on chronic renal failure-patient's baseline creatinine last month was close to 2.7 and now it's elevated to 3.6 and he presents with  volume overload and hyperkalemia. -Seen by nephrology and started on hemodialysis yesterday. Volume status and hyperkalemia has improved. Patient refusing hemodialysis today. Palliative care to readdress  goals of care as he is now dialysis dependent as per nephrology.  5. Acute liver failure-etiology unclear presently. Patient's LFTs are significantly elevated. Seen by gastroenterology and given vitamin K for the coagulopathy. -Right upper quadrant ultrasound suggestive of portal hypertension. Await Doppler ultrasound to further look at the portal vessels. Prognosis poor given liver and renal failure and not a transplant candidate as per Gastroenterology.  - follow LFT's.   6. History of CML-patient has chronic leukocytosis, thrombocytosis, and also anemia. -He is followed [facility] at [facility]. Hg. Stable post transfusion and will cont. To monitor counts.  - consult Oncology if needed.     All the records are reviewed and case discussed with Care Management/Social Worker. Management plans discussed with the patient, family and they are in agreement.  CODE STATUS: DNR  DVT Prophylaxis: Ted's & SCD's  TOTAL TIME TAKING CARE OF THIS PATIENT: 30 minutes.   POSSIBLE D/C IN 2-3 DAYS, DEPENDING ON CLINICAL CONDITION.   Henreitta Leber M.D on 08/19/2016 at 2:28 PM  Between 7am to 6pm - Pager - [phone]  After 6pm go to [email or web address] - [employer] [facility]  Office  [phone]  CC: Primary care physician; [name], MD

## 2016-08-19 NOTE — Progress Notes (Signed)
[facility], [place] 08/19/16  Subjective:   Patient underwent emergent HD yesterday for hyperkalemia and volume overload Alert this morning; requiring HFNC K 5.1;    HEMODIALYSIS FLOWSHEET:  Blood Flow Rate (mL/min): 300 mL/min Arterial Pressure (mmHg): -130 mmHg Venous Pressure (mmHg): 90 mmHg Transmembrane Pressure (mmHg): 60 mmHg Ultrafiltration Rate (mL/min): 1000 mL/min Dialysate Flow Rate (mL/min): 500 ml/min Conductivity: Machine : 13.9 Conductivity: Machine : 13.9 Dialysis Fluid Bolus: Normal Saline Bolus Amount (mL): 250 mL Dialysate Change: 2K     Objective:  Vital signs in last 24 hours:  Temp:  [94.1 F (34.5 C)-100.4 F (38 C)] 98.4 F (36.9 C) (05/10 0845) Pulse Rate:  [61-77] 61 (05/10 0745) Resp:  [8-28] 16 (05/10 0845) BP: (93-130)/(35-86) 103/55 (05/10 0845) SpO2:  [91 %-100 %] 91 % (05/10 0745) FiO2 (%):  [50 %-58 %] 58 % (05/09 2051) Weight:  [81.5 kg (179 lb 10.8 oz)] 81.5 kg (179 lb 10.8 oz) (05/10 0658)  Weight change: 3.935 kg (8 lb 10.8 oz) Filed Weights   08/18/16 1034 08/18/16 1430 08/19/16 0658  Weight: 81.5 kg (179 lb 10.8 oz) 81.5 kg (179 lb 10.8 oz) 81.5 kg (179 lb 10.8 oz)    Intake/Output:    Intake/Output Summary (Last 24 hours) at 08/19/16 0901 Last data filed at 08/19/16 0700  Gross per 24 hour  Intake             2225 ml  Output              850 ml  Net             1375 ml     Physical Exam: General: Chronically ill-appearing, NAD  HEENT Muddy sclera, HFNC  Neck Supple  Pulm/lungs Mild bilateral crackles at bases  CVS/Heart Regular rhythm   Abdomen:   distended from ascites,  Extremities: + dependent pitting edema   Neurologic: Alert, able to answer questions, speech is somewhat hard to understand   Skin: Warm, no acute rashes   Access: Right IJ PermCath        Basic Metabolic Panel:   Recent Labs Lab 08/18/16 0726 08/18/16 1423 08/18/16 1547 08/18/16 2248 08/19/16 0425   NA 135  --   --   --  138  K 7.2*  --  6.2* 5.0 5.1  CL 105  --   --   --  104  CO2 <7*  --   --   --  21*  GLUCOSE 121*  --   --   --  86  BUN 53*  --   --   --  54*  CREATININE 3.63*  --   --   --  3.55*  CALCIUM 8.3*  --   --   --  7.1*  MG 2.2  --   --   --   --   PHOS  --  7.4*  --   --   --      CBC:  Recent Labs Lab 08/18/16 0518 08/18/16 2124 08/19/16 0515  WBC 27.7*  --  29.0*  NEUTROABS 24.1*  --   --   HGB 6.9* 6.5* 7.2*  HCT 24.3*  --  22.5*  MCV 105.7*  --  88.1  PLT 852*  --  575*      Lab Results  Component Value Date   HEPBSAG Negative 08/18/2016   HEPBSAB Reactive 07/28/2016   HEPBIGM Negative 08/18/2016      Microbiology:  Recent Results (  from the past 240 hour(s))  Culture, blood (routine x 2)     Status: None (Preliminary result)   Collection Time: 08/18/16  7:25 AM  Result Value Ref Range Status   Specimen Description BLOOD R FEM  Final   Special Requests   Final    BOTTLES DRAWN AEROBIC AND ANAEROBIC Blood Culture adequate volume   Culture NO GROWTH < 24 HOURS  Final   Report Status PENDING  Incomplete  Culture, blood (routine x 2)     Status: None (Preliminary result)   Collection Time: 08/18/16  7:25 AM  Result Value Ref Range Status   Specimen Description BLOOD R FEM  Final   Special Requests   Final    BOTTLES DRAWN AEROBIC AND ANAEROBIC Blood Culture adequate volume   Culture NO GROWTH < 24 HOURS  Final   Report Status PENDING  Incomplete  MRSA PCR Screening     Status: None   Collection Time: 08/18/16 10:39 AM  Result Value Ref Range Status   MRSA by PCR NEGATIVE NEGATIVE Final    Comment:        The GeneXpert MRSA Assay (FDA approved for NASAL specimens only), is one component of a comprehensive MRSA colonization surveillance program. It is not intended to diagnose MRSA infection nor to guide or monitor treatment for MRSA infections.   Culture, blood (routine x 2)     Status: None (Preliminary result)   Collection  Time: 08/18/16 11:04 AM  Result Value Ref Range Status   Specimen Description BLOOD R AC  Final   Special Requests   Final    BOTTLES DRAWN AEROBIC AND ANAEROBIC Blood Culture adequate volume   Culture NO GROWTH < 24 HOURS  Final   Report Status PENDING  Incomplete  Culture, blood (routine x 2)     Status: None (Preliminary result)   Collection Time: 08/18/16 11:04 AM  Result Value Ref Range Status   Specimen Description BLOOD R HAND  Final   Special Requests   Final    BOTTLES DRAWN AEROBIC AND ANAEROBIC Blood Culture adequate volume   Culture NO GROWTH < 24 HOURS  Final   Report Status PENDING  Incomplete    Coagulation Studies:  Recent Labs  08/18/16 0724 08/19/16 0425  LABPROT 41.4* 30.5*  INR 4.17* 2.85    Urinalysis:  Recent Labs  08/18/16 0518  COLORURINE AMBER*  LABSPEC 1.018  PHURINE 5.0  GLUCOSEU NEGATIVE  HGBUR SMALL*  BILIRUBINUR NEGATIVE  KETONESUR 5*  PROTEINUR 100*  NITRITE NEGATIVE  LEUKOCYTESUR NEGATIVE      Imaging: Dg Abdomen 1 View  Result Date: 08/18/2016 CLINICAL DATA:  Status post central line placement EXAM: ABDOMEN - 1 VIEW COMPARISON:  None. FINDINGS: Scattered large and small bowel gas is noted. Stomach is distended with air. A a right femoral central line is noted with the tip just above the acetabulum on the right. Some scattered calcifications overlying the midline which may be related to the pancreas. No free air is seen. No bony abnormality is noted. Previous medullary rod placement in the right femur is noted. IMPRESSION: Central line as described. No abnormality is seen. Electronically Signed   By: Inez Catalina M.D.   On: 08/18/2016 08:00   Ct Head Wo Contrast  Result Date: 08/18/2016 CLINICAL DATA:  Altered mental status EXAM: CT HEAD WITHOUT CONTRAST TECHNIQUE: Contiguous axial images were obtained from the base of the skull through the vertex without intravenous contrast. COMPARISON:  05/02/2016 FINDINGS: Brain: Mild  diffuse  cerebral atrophy. No acute intracranial abnormality. Specifically, no hemorrhage, hydrocephalus, mass lesion, acute infarction, or significant intracranial injury. Vascular: No hyperdense vessel or unexpected calcification. Skull: No acute calvarial abnormality. Sinuses/Orbits: Visualized paranasal sinuses and mastoids clear. Orbital soft tissues unremarkable. Old bilateral medial orbital wall blowout fractures noted, stable. Other: None IMPRESSION: Diffuse cerebral atrophy.  No acute intracranial abnormality. Electronically Signed   By: Rolm Baptise M.D.   On: 08/18/2016 10:11   Dg Chest Portable 1 View  Result Date: 08/18/2016 CLINICAL DATA:  Sepsis EXAM: PORTABLE CHEST 1 VIEW COMPARISON:  07/29/2016 FINDINGS: Cardiac shadow remains enlarged. Right jugular dialysis catheter is again seen in satisfactory position. The lungs are clear bilaterally. No acute bony abnormality is seen. IMPRESSION: No acute abnormality noted. Electronically Signed   By: Inez Catalina M.D.   On: 08/18/2016 08:00   US Abdomen Limited Ruq  Result Date: 08/18/2016 CLINICAL DATA:  Elevated LFTs EXAM: US ABDOMEN LIMITED - RIGHT UPPER QUADRANT COMPARISON:  None. FINDINGS: Gallbladder: Gallbladder wall thickening is noted to 5 mm. No definitive cholelithiasis or pericholecystic fluid is seen. Common bile duct: Diameter: 3.9 mm. Liver: No focal lesion identified. Within normal limits in parenchymal echogenicity. There is 8 degree of by directionality in the portal vein. This may represent some very early portal hypertension. Note is made of minimal free fluid surrounding the liver. This may contribute to the degree of gallbladder wall thickening. Mild increased echogenicity in the kidney is noted as well. IMPRESSION: Gallbladder wall thickening without cholelithiasis. This may be related to the incomplete distention or possibly related to the minimal amount of free fluid surrounding the liver. Increased echogenicity in the right kidney.  Suggestion of early portal hypertension. Electronically Signed   By: Inez Catalina M.D.   On: 08/18/2016 14:27     Medications:   . calcium gluconate 1 GM IV    . ceFEPime (MAXIPIME) IV     . mouth rinse  15 mL Mouth Rinse BID  . phytonadione  10 mg Subcutaneous Daily  . tiotropium  18 mcg Inhalation q morning - 10a     Assessment/ Plan:  71 y.o.African-American  male  with history of alcoholism, severe chronic systolic congestive heart failure, history of stroke, hypertension, coronary disease, hyperlipidemia  returns for decreased responsiveness and is found to have low blood sugar, acute renal failure, hyperkalemia and fluid overload   1. Acute renal failure on CKD st 4 with severe hyperkalemia  Underwent emergent HD yesterday - clinically improved but is now refusing further HD - recommend palliative care consult to clarify goals of care - Patient is likely dialysis dependent as he developed severe volume overload and electrolyte imbalance within 2 weeks of not taking dialysis. Last HD  treatment was 07/31/16  2. Anemia of CKD  Lab Results  Component Value Date   HGB 7.2 (L) 08/19/2016   Received blood transfusion earlier today  3. Severe volume overload with anasarca, abdominal distention and lower extremity edema -  UF with HD as tolerated  4. Severe acidosis -  Improved  5. Underlying CML - f/u at [facility] with Dr Grayland Ormond    LOS: [place] 5/10/20189:01 AM  [facility] [place], [place]

## 2016-08-20 ENCOUNTER — Inpatient Hospital Stay: Payer: Medicare Other

## 2016-08-20 DIAGNOSIS — N184 Chronic kidney disease, stage 4 (severe): Secondary | ICD-10-CM

## 2016-08-20 DIAGNOSIS — Z7189 Other specified counseling: Secondary | ICD-10-CM

## 2016-08-20 DIAGNOSIS — Z515 Encounter for palliative care: Secondary | ICD-10-CM

## 2016-08-20 DIAGNOSIS — N179 Acute kidney failure, unspecified: Secondary | ICD-10-CM

## 2016-08-20 LAB — COMPREHENSIVE METABOLIC PANEL
ALT: 1049 U/L — AB (ref 17–63)
AST: 1504 U/L — AB (ref 15–41)
Albumin: 2.7 g/dL — ABNORMAL LOW (ref 3.5–5.0)
Alkaline Phosphatase: 245 U/L — ABNORMAL HIGH (ref 38–126)
Anion gap: 8 (ref 5–15)
BUN: 39 mg/dL — AB (ref 6–20)
CHLORIDE: 102 mmol/L (ref 101–111)
CO2: 26 mmol/L (ref 22–32)
CREATININE: 2.84 mg/dL — AB (ref 0.61–1.24)
Calcium: 7.2 mg/dL — ABNORMAL LOW (ref 8.9–10.3)
GFR calc Af Amer: 24 mL/min — ABNORMAL LOW (ref >60)
GFR, EST NON AFRICAN AMERICAN: 21 mL/min — AB (ref >60)
Glucose, Bld: 97 mg/dL (ref 65–99)
Potassium: 3.7 mmol/L (ref 3.5–5.1)
Sodium: 136 mmol/L (ref 135–145)
Total Bilirubin: 4 mg/dL — ABNORMAL HIGH (ref 0.3–1.2)
Total Protein: 5.7 g/dL — ABNORMAL LOW (ref 6.5–8.1)

## 2016-08-20 LAB — BPAM RBC
BLOOD PRODUCT EXPIRATION DATE: 201805172359
BLOOD PRODUCT EXPIRATION DATE: 201806012359
ISSUE DATE / TIME: 201805091323
ISSUE DATE / TIME: 201805100113
UNIT TYPE AND RH: 5100
Unit Type and Rh: 5100

## 2016-08-20 LAB — TYPE AND SCREEN
ABO/RH(D): O POS
ANTIBODY SCREEN: NEGATIVE
UNIT DIVISION: 0
UNIT DIVISION: 0

## 2016-08-20 LAB — CBC
HEMATOCRIT: 21.9 % — AB (ref 40.0–52.0)
HEMOGLOBIN: 7.3 g/dL — AB (ref 13.0–18.0)
MCH: 29.9 pg (ref 26.0–34.0)
MCHC: 33.3 g/dL (ref 32.0–36.0)
MCV: 89.7 fL (ref 80.0–100.0)
Platelets: 540 10*3/uL — ABNORMAL HIGH (ref 150–440)
RBC: 2.44 MIL/uL — ABNORMAL LOW (ref 4.40–5.90)
RDW: 20.1 % — ABNORMAL HIGH (ref 11.5–14.5)
WBC: 20.6 10*3/uL — ABNORMAL HIGH (ref 3.8–10.6)

## 2016-08-20 LAB — HEPATITIS B DNA, ULTRAQUANTITATIVE, PCR
HBV DNA SERPL PCR-ACNC: <10 IU/mL
HBV DNA SERPL PCR-LOG IU: UNDETERMINED {Log_IU}/mL

## 2016-08-20 LAB — PROTIME-INR
INR: 1.74
PROTHROMBIN TIME: 20.6 s — AB (ref 11.4–15.2)

## 2016-08-20 LAB — HEPATITIS B E ANTIBODY: Hep B E Ab: NEGATIVE

## 2016-08-20 LAB — HEPATITIS C VRS RNA DETECT BY PCR-QUAL: Hepatitis C Vrs RNA by PCR-Qual: NEGATIVE

## 2016-08-20 LAB — PROCALCITONIN: PROCALCITONIN: 11.9 ng/mL

## 2016-08-20 MED ORDER — TUBERCULIN PPD 5 UNIT/0.1ML ID SOLN
5.0000 [IU] | Freq: Once | INTRADERMAL | Status: AC
  Administered 2016-08-20: 5 [IU] via INTRADERMAL
  Filled 2016-08-20 (×2): qty 0.1

## 2016-08-20 MED ORDER — ALUM & MAG HYDROXIDE-SIMETH 200-200-20 MG/5ML PO SUSP
30.0000 mL | Freq: Four times a day (QID) | ORAL | Status: DC | PRN
  Administered 2016-08-20: 30 mL via ORAL
  Filled 2016-08-20: qty 30

## 2016-08-20 NOTE — Progress Notes (Signed)
[facility] NAME: Bruce [name]    MR#:  [id number]  DATE OF BIRTH:  [birth year]  SUBJECTIVE:   Patient admitted due to altered mental status/encephalopathy and also noted to be volume overloaded and in acute on chronic kidney injury with severe hyperkalemia. Also noted to have acute liver failure with abnormal LFTs, elevated INR.  Mental status improved today and potassium normalized w/ HD. LFT's improving.   REVIEW OF SYSTEMS:    Review of Systems  Constitutional: Negative for chills and fever.  HENT: Negative for congestion and tinnitus.   Eyes: Negative for blurred vision and double vision.  Respiratory: Negative for cough, shortness of breath and wheezing.   Cardiovascular: Negative for chest pain, orthopnea and PND.  Gastrointestinal: Negative for abdominal pain, diarrhea, nausea and vomiting.  Genitourinary: Negative for dysuria and hematuria.  Neurological: Positive for weakness. Negative for dizziness, sensory change and focal weakness.  All other systems reviewed and are negative.   Nutrition: Renal diet Tolerating Diet: Yes Tolerating PT:  Eval noted.   DRUG ALLERGIES:  No Known Allergies  VITALS:  Blood pressure (!) 104/55, pulse 70, temperature 98.1 F (36.7 C), temperature source Oral, resp. rate 18, height 5\' 8"  (1.727 m), weight 78.8 kg (173 lb 12.8 oz), SpO2 94 %.  PHYSICAL EXAMINATION:   Physical Exam  GENERAL:  71 y.o.-year-old patient lying in bed in no acute distress.  EYES: Pupils equal, round, reactive to light and accommodation. No scleral icterus. Extraocular muscles intact.  HEENT: Head atraumatic, normocephalic. Oropharynx and nasopharynx clear.  NECK:  Supple, no jugular venous distention. No thyroid enlargement, no tenderness.  LUNGS: Normal breath sounds bilaterally, no wheezing, rales, rhonchi. No use of accessory muscles of respiration.  CARDIOVASCULAR: S1, S2 normal. No murmurs, rubs,  or gallops.  ABDOMEN: Soft, nontender, nondistended. Bowel sounds present. No organomegaly or mass.  EXTREMITIES: No cyanosis, clubbing or edema b/l.    NEUROLOGIC: Cranial nerves II through XII are intact. No focal Motor or sensory deficits b/l.  Globally weak PSYCHIATRIC: The patient is alert and oriented x 2.  SKIN: No obvious rash, lesion, or ulcer.   Right chest wall Perm cath in place.   LABORATORY PANEL:   CBC  Recent Labs Lab 08/20/16 0630  WBC 20.6*  HGB 7.3*  HCT 21.9*  PLT 540*   ------------------------------------------------------------------------------------------------------------------  Chemistries   Recent Labs Lab 08/18/16 0726  08/20/16 0630  NA 135  < > 136  K 7.2*  < > 3.7  CL 105  < > 102  CO2 <7*  < > 26  GLUCOSE 121*  < > 97  BUN 53*  < > 39*  CREATININE 3.63*  < > 2.84*  CALCIUM 8.3*  < > 7.2*  MG 2.2  --   --   AST 629*  < > 1,504*  ALT 296*  < > 1,049*  ALKPHOS 273*  < > 245*  BILITOT 3.3*  < > 4.0*  < > = values in this interval not displayed. ------------------------------------------------------------------------------------------------------------------  Cardiac Enzymes  Recent Labs Lab 08/19/16 0337  TROPONINI 0.28*   ------------------------------------------------------------------------------------------------------------------  RADIOLOGY:  [place] Art/ven Flow Abd Pelv Doppler  Addendum Date: 08/20/2016   ADDENDUM REPORT: 08/20/2016 13:30 ADDENDUM: Incidental note is made of left pleural effusion. Electronically Signed   By: Marijo Conception, M.D.   On: 08/20/2016 13:30   Result Date: 08/20/2016 CLINICAL DATA:  Abnormal liver function tests. EXAM: DUPLEX ULTRASOUND OF LIVER TECHNIQUE:  Color and duplex Doppler ultrasound was performed to evaluate the hepatic in-flow and out-flow vessels. COMPARISON:  Ultrasound of September 14, 2014. FINDINGS: Portal Vein Velocities Main:  70 cm/sec Right:  60.5 cm/sec Left:  47.9 cm/sec Pulsatile  bidirectional flow is noted in the portal veins. Hepatic Vein Velocities Right:  54.4 cm/sec Middle:  51.5 cm/sec Left:  83.4 cm/sec Pulsatile bidirectional flow is noted in hepatic veins. Hepatic Artery Velocity:  190 cm/sec Splenic Vein Velocity:  33.8 cm/sec Varices: Absent. Ascites: Minimal ascites is noted around the liver and spleen. Spleen is normal in size and appearance. IMPRESSION: No evidence of portal, hepatic or splenic venous thrombosis or occlusion is noted. However, bidirectional flow is noted in the portal and hepatic veins, which was noted on prior exam. This may reflect portal hypertension secondary to hepatocellular disease, or potentially may represent post hepatic causes such as cardiac disease resulting in elevated venous pressures. Minimal ascites is noted.  No splenomegaly is noted. Electronically Signed: By: Marijo Conception, M.D. On: 08/20/2016 10:55   US Abdomen Limited Ruq  Result Date: 08/18/2016 CLINICAL DATA:  Elevated LFTs EXAM: US ABDOMEN LIMITED - RIGHT UPPER QUADRANT COMPARISON:  None. FINDINGS: Gallbladder: Gallbladder wall thickening is noted to 5 mm. No definitive cholelithiasis or pericholecystic fluid is seen. Common bile duct: Diameter: 3.9 mm. Liver: No focal lesion identified. Within normal limits in parenchymal echogenicity. There is 8 degree of by directionality in the portal vein. This may represent some very early portal hypertension. Note is made of minimal free fluid surrounding the liver. This may contribute to the degree of gallbladder wall thickening. Mild increased echogenicity in the kidney is noted as well. IMPRESSION: Gallbladder wall thickening without cholelithiasis. This may be related to the incomplete distention or possibly related to the minimal amount of free fluid surrounding the liver. Increased echogenicity in the right kidney. Suggestion of early portal hypertension. Electronically Signed   By: Inez Catalina M.D.   On: 08/18/2016 14:27      ASSESSMENT AND PLAN:   71 year old male with past medical history of chronic myelogenous leukemia, chronic kidney disease stage III, hypertension, history of coronary artery disease, chronic combined systolic diastolic CHF who presents to the hospital due to altered mental status.  1. Altered mental status-metabolic encephalopathy secondary to lactic acidosis, volume overload and worsening renal failure. -CT head negative for acute pathology. Mental status much improved after hemodialysis and as the patient's acidosis has corrected.  2. Acute respiratory failure with hypoxia-secondary to volume overload from combined diastolic and systolic CHF and worsening renal failure. -Status post hemodialysis and volume status has improved. he was on high flow nasal cannula now weaned to just nasal cannula 2 L.  3. Hyperkalemia-secondary to acute on chronic renal failure.  - he got insulin, D50 and calcium gluconate in the ER.  -Patient is s/p HD and potassium has now improved.  4. Acute on chronic renal failure-patient's baseline creatinine last month was close to 2.7 and now back to baseline with HD.  -  he presented with volume overload and hyperkalemia. - Nephro following and was refusing HD yesterday.  NO urgent need for HD today. Palliative care to readdress goals of care as he is now dialysis dependent as per nephrology.  5. Acute liver failure-etiology unclear presently. Patient's LFTs improved since yesterday. - appreciate GI input and given vitamin K for the coagulopathy. -Right upper quadrant ultrasound suggestive of portal hypertension.  Doppler US (-) portal or splenic vein thrombosis. Prognosis poor  given liver and renal failure and not a transplant candidate as per Gastroenterology.  - follow LFT's.   6. History of CML-patient has chronic leukocytosis, thrombocytosis, and also anemia. -He is followed extensively at [facility]. Hg. Stable post transfusion and will cont. To monitor  counts.  - consult Oncology if needed.   Palliative care consult to discuss goals of care.   All the records are reviewed and case discussed with Care Management/Social Worker. Management plans discussed with the patient, family and they are in agreement.  CODE STATUS: DNR  DVT Prophylaxis: Ted's & SCD's  TOTAL TIME TAKING CARE OF THIS PATIENT: 30 minutes.   POSSIBLE D/C IN 2-3 DAYS, DEPENDING ON CLINICAL CONDITION.   Henreitta Leber M.D on 08/20/2016 at 2:07 PM  Between 7am to 6pm - Pager - [phone]  After 6pm go to [email or web address] - Technical brewer [facility]  Office  [phone]  CC: Primary care physician; [name], MD

## 2016-08-20 NOTE — Progress Notes (Signed)
R femoral Central line removed per MD order.

## 2016-08-20 NOTE — Evaluation (Signed)
Physical Therapy Evaluation Patient Details Name: Bruce [name] MRN: [id number] DOB: 1946/03/02 Today's Date: 08/20/2016   History of Present Illness  71 y.o. male with a known history of CAD, CHF, CK D, CML and cocaine abuse. Recently here with shortness of breath and productive cough for several months. He was admitted to [facility] in March and was thought to have a coronary vasospasm from cocaine use, CHF exacerbation and non-STEMI. Now admitted with Hyperkalemia.  Clinical Impression  Pt showed good effort with PT exam and though he needed some assist with getting to EOB and initially was unable to get to standing he ultimately did get up and was able to do some limited walking with heavy reliance on the walker.  Pt with general weakness and occasional impulsiveness but did have some functional mobility.  Pt will benefit from continued PT on discharge.   Follow Up Recommendations SNF    Equipment Recommendations  Rolling walker with 5" wheels    Recommendations for Other Services       Precautions / Restrictions Precautions Precautions: Fall Restrictions Weight Bearing Restrictions: No      Mobility  Bed Mobility Overal bed mobility: Needs Assistance Bed Mobility: Supine to Sit;Sit to Supine     Supine to sit: Min assist Sit to supine: Min assist   General bed mobility comments: Pt showed good effort in getting to EOB, did need extra time and some light direct assist  Transfers Overall transfer level: Needs assistance Equipment used: Rolling walker (2 wheeled) Transfers: Sit to/from Stand Sit to Stand: Mod assist         General transfer comment: On first attempt to get to standing pt was unable to rise even with heavy assist, after cuing and some positioning adjustements he was able to rise, still with significant assist  Ambulation/Gait Ambulation/Gait assistance: Min assist Ambulation Distance (Feet): 15 Feet Assistive device: Rolling walker (2  wheeled)       General Gait Details: Pt initially unsteady and slow, he did improve with increased time and needed only CGA near the end but ultimately he was limited and did show some safety concerns.  Stairs            Wheelchair Mobility    Modified Rankin (Stroke Patients Only)       Balance Overall balance assessment: Needs assistance Sitting-balance support: Feet supported;Bilateral upper extremity supported Sitting balance-Leahy Scale: Fair     Standing balance support: Bilateral upper extremity supported Standing balance-Leahy Scale: Fair                               Pertinent Vitals/Pain Pain Assessment:  (general soreness, especially b/l LEs)    Home Living Family/patient expects to be discharged to:: Skilled nursing facility                 Additional Comments: palliative care services at [facility]    Prior Function Level of Independence: Independent with assistive device(s)         Comments: has quad cane, uses one at baseline, is currently homeless?     Hand Dominance        Extremity/Trunk Assessment   Upper Extremity Assessment Upper Extremity Assessment: Overall WFL for tasks assessed;Generalized weakness    Lower Extremity Assessment Lower Extremity Assessment: Overall WFL for tasks assessed;Generalized weakness (pain limited with testing)       Communication   Communication: No difficulties  Cognition Arousal/Alertness: Awake/alert Behavior During Therapy: Restless Overall Cognitive Status: Within Functional Limits for tasks assessed                                        General Comments      Exercises     Assessment/Plan    PT Assessment Patient needs continued PT services  PT Problem List Decreased strength;Decreased activity tolerance;Decreased balance;Decreased safety awareness;Decreased knowledge of use of DME;Cardiopulmonary status limiting activity        PT Treatment Interventions DME instruction;Gait training;Stair training;Functional mobility training;Therapeutic activities;Therapeutic exercise;Balance training;Neuromuscular re-education;Patient/family education    PT Goals (Current goals can be found in the Care Plan section)  Acute Rehab PT Goals Patient Stated Goal: Get stronger man PT Goal Formulation: With patient Time For Goal Achievement: 09/03/16 Potential to Achieve Goals: Fair    Frequency Min 2X/week   Barriers to discharge        Co-evaluation               AM-PAC PT "6 Clicks" Daily Activity  Outcome Measure Difficulty turning over in bed (including adjusting bedclothes, sheets and blankets)?: Total Difficulty moving from lying on back to sitting on the side of the bed? : Total Difficulty sitting down on and standing up from a chair with arms (e.g., wheelchair, bedside commode, etc,.)?: Total Help needed moving to and from a bed to chair (including a wheelchair)?: A Little Help needed walking in hospital room?: A Lot Help needed climbing 3-5 steps with a railing? : A Lot 6 Click Score: 10    End of Session Equipment Utilized During Treatment: Gait belt Activity Tolerance: Patient tolerated treatment well Patient left: with call bell/phone within reach;with bed alarm set   PT Visit Diagnosis: Muscle weakness (generalized) (M62.81);Difficulty in walking, not elsewhere classified (R26.2)    Time: 1829-9371 PT Time Calculation (min) (ACUTE ONLY): 20 min   Charges:   PT Evaluation $PT Eval Low Complexity: 1 Procedure     PT G Codes:        Kreg Shropshire, DPT 08/20/2016, 10:23 AM

## 2016-08-20 NOTE — Progress Notes (Signed)
[facility] responded to an OR for Pt in 202. Pt presented awake and alert. Pt stated that he decided to give dialysis "a shot" since the MD said he would not survive without it. Pt stated several times that he was "ready to just go ahead and die." Pt stated that his wife was a Sales promotion account executive Witness and told him before she passed that he needed to "get a hold of some religion and stay with it."  Pt wondered if part of his problem stemmed from him taking two blood transfusions. (Not allowed by [facility] Witnesses) Pt stated he believes it does not matter because he is just wanting to "pass on." Pt stated that he would like for me to come back and have prayer. [place] agreed and will follow up on next rounding.     08/20/16 1700  Clinical Encounter Type  Visited With Patient;Health care provider  Visit Type Initial;Spiritual support  Referral From Nurse  Consult/Referral To Chaplain  Spiritual Encounters  Spiritual Needs Prayer;Emotional

## 2016-08-20 NOTE — Progress Notes (Signed)
Resp failure resolving, on 2 L [place] HD as needed  Vital signs reviewed, ICU needs resolved,  Please call us if needed @ [phone], will sign off at this time.

## 2016-08-20 NOTE — Progress Notes (Signed)
PPD placed on Pt's R arm.

## 2016-08-20 NOTE — Care Management Important Message (Signed)
Important Message  Patient Details  Name: DESTINE AMBROISE MRN: [id number] Date of Birth: [birth year]   Medicare Important Message Given:  Yes Signed copy sent to HIM   Beverly Sessions, RN 08/20/2016, 3:28 PM

## 2016-08-20 NOTE — Consult Note (Signed)
Consultation Note Date: 08/20/2016   Patient Name: Bruce [name]  DOB: 1946-01-14  MRN: [id number]  Age / Sex: 71 y.o., male  PCP: [name], MD Referring Physician: Henreitta Leber, MD  Reason for Consultation: Establishing goals of care  HPI/Patient Profile: 71 y.o. male  with past medical history of  CML (declining further treatment or f/u for) GI bleed w/ symptomatic anemia (d/c'd 4/25 to [facility] w/ recommendation for palliative f/u- however unfortunately outpatient Palliative f/u was not able to be arranged before he came back in this admission), CKD (with AKI requiring temp HD during 07/2016 admission), admitted on 08/18/2016 with hypoglycemia, altered mental status. Workup revealed acute renal failure with hyperkalemia, lactic acidosis, elevated liver enzyes, metabolic acidosis. GI consulted- acute liver failure, hepatic congestion likely d/t heart failure/sepsis/ischemic/amiodorone; Nephrology consulted- patient requiring HD, now to be HD dependent. HD has improved status, mental status has improved. On previous admission patient stated he did not want long term HD. Palliative medicine consulted for [place].   Clinical Assessment and Goals of Care: Met with patient at bedside. He recalled our prior meetings during our last admission. He feels he was doing well at rehab before admission. He had made a few friends. His cousin, brother and a few other friends were visiting him. For the first time in a while he had a home.   We discussed his current health and possible trajectories. He tells me his liver, his kidneys and his heart are "shot". He understands that HD is helping him and can add time to his life. We talked about the pros and cons of ongoing dialysis and the fact that he can start, and if not tolerated, he can stop and transition to Hospice care if he chose. d  He reiterated to me that  he wants to die a natural death per our conversations during his last admission, however, he isn't ready to die yet. He states he needs to get right with God and feels he needs some time to do this. He talked about his first wife who was a Jehovah's witness. He would like to meet with a Chaplain.              Primary Decision Maker PATIENT    SUMMARY OF RECOMMENDATIONS -Continue current level of care -HD -D/C to rehab with outpatient Palliative following for progress on HD- patient may wish to transition to Hospice if not tolerating HD or if condition does not continue to improve with HD    Code Status/Advance Care Planning:  DNR  Psycho-social/Spiritual:   Desire for further Chaplaincy support:yes  Prognosis:    Unable to determine  Discharge Planning: [facility] for rehab with Palliative care service follow-up  Primary Diagnoses: Present on Admission: . Hyperkalemia   I have reviewed the medical record, interviewed the patient and family, and examined the patient. The following aspects are pertinent.  Past Medical History:  Diagnosis Date  . Bell's palsy   . Chronic combined systolic and diastolic CHF, NYHA class  3 ([place])   . CKD (chronic kidney disease), stage II   . CML (chronic myelocytic leukemia) ([place])   . Coronary artery disease, non-occlusive   . Hypercholesterolemia   . Hypertension   . Leukemia ([place])   . Stroke ([place])    No residual limb weakness.  Walks with cane at baseline.   Marland Kitchen TIA (transient ischemic attack) 05/10/2014   Social History   Social History  . Marital status: Widowed    Spouse name: N/A  . Number of children: 0  . Years of education: N/A   Occupational History  . retired    Social History Main Topics  . Smoking status: Former Smoker    Packs/day: 0.50    Years: 50.00    Types: Cigarettes  . Smokeless tobacco: Never Used  . Alcohol use No  . Drug use: Yes  . Sexual activity: No   Other Topics Concern  . None    Social History Narrative   Patient is right handed.   Patient drinks 1-2 cups daily.   Family History  Problem Relation Age of Onset  . Diabetes Mother   . Hypertension Mother   . Diabetes Father   . Hypertension Father   . Diabetes Brother   . Hypertension Brother   . Diabetes Sister   . Hypertension Sister   . Diabetes Brother   . Hypertension Brother   . Diabetes Sister   . Hypertension Sister    Scheduled Meds: . mouth rinse  15 mL Mouth Rinse BID  . tiotropium  18 mcg Inhalation q morning - 10a  . tuberculin  5 Units Intradermal Once   Continuous Infusions: . calcium gluconate 1 GM IV    . ceFEPime (MAXIPIME) IV Stopped (08/19/16 1801)   PRN Meds:.acetaminophen **OR** acetaminophen, ipratropium-albuterol, ondansetron **OR** ondansetron (ZOFRAN) IV, senna-docusate Medications Prior to Admission:  Prior to Admission medications   Medication Sig Start Date End Date Taking? Authorizing Provider  amiodarone (PACERONE) 200 MG tablet Take 1 tablet (200 mg total) by mouth daily. 12/30/15  Yes Shirley Friar, PA-C  metFORMIN (GLUCOPHAGE) 500 MG tablet Take 500 mg by mouth 2 (two) times daily with a meal.   Yes [provider]  metoprolol succinate (TOPROL-XL) 25 MG 24 hr tablet Take 0.5 tablets (12.5 mg total) by mouth daily. 08/04/16  Yes Gouru, Illene Silver, MD  tiotropium (SPIRIVA) 18 MCG inhalation capsule Place 18 mcg into inhaler and inhale daily.   Yes [provider]  acetaminophen (TYLENOL) 325 MG tablet Take 2 tablets (650 mg total) by mouth every 4 (four) hours as needed for headache or mild pain. 06/06/15   [name], MD  albuterol (PROVENTIL HFA;VENTOLIN HFA) 108 (90 BASE) MCG/ACT inhaler Inhale 2 puffs into the lungs every 4 (four) hours as needed for wheezing or shortness of breath. Patient not taking: Reported on 08/18/2016 08/08/14   Noemi Chapel, MD  glycopyrrolate (ROBINUL) 0.2 MG/ML injection Inject 1 mL (0.2 mg total) into the skin every  4 (four) hours as needed (excessive secretions). 08/04/16   Gouru, Illene Silver, MD  LORazepam (ATIVAN) 1 MG tablet Take 1 tablet (1 mg total) by mouth every 4 (four) hours as needed for anxiety. 08/04/16   Nicholes Mango, MD  Morphine Sulfate (MORPHINE CONCENTRATE) 10 MG/0.5ML SOLN concentrated solution Place 0.25 mLs (5 mg total) under the tongue every 2 (two) hours as needed for moderate pain (or dyspnea). 08/04/16   Gouru, Illene Silver, MD  ondansetron (ZOFRAN) 4 MG tablet Take 1 tablet (4  mg total) by mouth every 6 (six) hours as needed for nausea. 08/04/16   Gouru, Illene Silver, MD  senna-docusate (SENOKOT-S) 8.6-50 MG tablet Take 1 tablet by mouth at bedtime as needed for mild constipation. 08/04/16   Nicholes Mango, MD   No Known Allergies Review of Systems  Constitutional: Positive for activity change.    Physical Exam  Constitutional: He is oriented to person, place, and time.  Cardiovascular: Normal rate and intact distal pulses.   Pulmonary/Chest: Effort normal.  Abdominal: Soft.  Musculoskeletal: He exhibits edema.  Neurological: He is alert and oriented to person, place, and time.  Skin: Skin is dry.    Vital Signs: BP (!) 104/55 (BP Location: Right Arm)   Pulse 70   Temp 98.1 F (36.7 C) (Oral)   Resp 18   Ht 5' 8"  (1.727 m)   Wt 78.8 kg (173 lb 12.8 oz)   SpO2 94%   BMI 26.43 kg/m  Pain Assessment: No/denies pain POSS *See Group Information*: S-Acceptable,Sleep, easy to arouse Pain Score: 0-No pain   SpO2: SpO2: 94 % O2 Device:SpO2: 94 % O2 Flow Rate: .O2 Flow Rate (L/min): 2 L/min  IO: Intake/output summary:  Intake/Output Summary (Last 24 hours) at 08/20/16 1601 Last data filed at 08/20/16 1254  Gross per 24 hour  Intake              490 ml  Output              575 ml  Net              -85 ml    LBM: Last BM Date:  (Pt is unsure) Baseline Weight: Weight: 77.6 kg (171 lb) Most recent weight: Weight: 78.8 kg (173 lb 12.8 oz)     Palliative Assessment/Data: PPS:  40%   Flowsheet Rows     Most Recent Value  Intake Tab  Referral Department  Hospitalist  Unit at Time of Referral  Med/Surg Unit  Palliative Care Primary Diagnosis  Nephrology  Date Notified  08/20/16  Palliative Care Type  Return patient Palliative Care  Reason for referral  Clarify Goals of Care  Date of Admission  08/18/16  # of days IP prior to Palliative referral  2  Clinical Assessment  Psychosocial & Spiritual Assessment  Palliative Care Outcomes      Thank you for this consult. Palliative medicine will continue to follow and assist as needed.   Time In: 1500 Time Out: 1630 Time Total: 90 minutes Greater than 50%  of this time was spent counseling and coordinating care related to the above assessment and plan.  Signed by: Mariana Kaufman, AGNP-C Palliative Medicine    Please contact Palliative Medicine Team phone at [phone] for questions and concerns.  For individual provider: See Shea Evans

## 2016-08-20 NOTE — Progress Notes (Signed)
[facility], [place] 08/20/16  Subjective:    mental status much improved Patient is a lot calmer He is currently on room air States he ate his breakfast.  No nausea or vomiting.  No leg edema now.   Objective:  Vital signs in last 24 hours:  Temp:  [98.1 F (36.7 C)-98.6 F (37 C)] 98.1 F (36.7 C) (05/11 1345) Pulse Rate:  [67-74] 70 (05/11 1345) Resp:  [17-18] 18 (05/11 0432) BP: (99-112)/(55-57) 104/55 (05/11 1345) SpO2:  [94 %-100 %] 94 % (05/11 1345) Weight:  [78.8 kg (173 lb 12.8 oz)] 78.8 kg (173 lb 12.8 oz) (05/11 0429)  Weight change: -2.5 kg (-5 lb 8.2 oz) Filed Weights   08/19/16 0658 08/19/16 1013 08/20/16 0429  Weight: 81.5 kg (179 lb 10.8 oz) 79 kg (174 lb 2.6 oz) 78.8 kg (173 lb 12.8 oz)    Intake/Output:    Intake/Output Summary (Last 24 hours) at 08/20/16 1514 Last data filed at 08/20/16 1254  Gross per 24 hour  Intake              490 ml  Output              575 ml  Net              -85 ml     Physical Exam: General: Chronically ill-appearing, NAD  HEENT Muddy sclera,   Neck Supple  Pulm/lungs Normal effort, room air, clear to auscultation  CVS/Heart Regular rhythm   Abdomen:   distended from ascites,  Extremities: Trace dependent pitting edema   Neurologic: Alert and oriented  Skin: Warm, no acute rashes   Access: Right IJ PermCath        Basic Metabolic Panel:   Recent Labs Lab 08/18/16 0726 08/18/16 1423 08/18/16 1547 08/18/16 2248 08/19/16 0425 08/20/16 0630  NA 135  --   --   --  138 136  K 7.2*  --  6.2* 5.0 5.1 3.7  CL 105  --   --   --  104 102  CO2 <7*  --   --   --  21* 26  GLUCOSE 121*  --   --   --  86 97  BUN 53*  --   --   --  54* 39*  CREATININE 3.63*  --   --   --  3.55* 2.84*  CALCIUM 8.3*  --   --   --  7.1* 7.2*  MG 2.2  --   --   --   --   --   PHOS  --  7.4*  --   --   --   --      CBC:  Recent Labs Lab 08/18/16 0518 08/18/16 2124 08/19/16 0515 08/20/16 0630  WBC  27.7*  --  29.0* 20.6*  NEUTROABS 24.1*  --   --   --   HGB 6.9* 6.5* 7.2* 7.3*  HCT 24.3*  --  22.5* 21.9*  MCV 105.7*  --  88.1 89.7  PLT 852*  --  575* 540*      Lab Results  Component Value Date   HEPBSAG Negative 08/18/2016   HEPBSAB Reactive 07/28/2016   HEPBIGM Negative 08/18/2016      Microbiology:  Recent Results (from the past 240 hour(s))  Culture, blood (routine x 2)     Status: None (Preliminary result)   Collection Time: 08/18/16  7:25 AM  Result Value Ref Range Status   Specimen  Description BLOOD R FEM  Final   Special Requests   Final    BOTTLES DRAWN AEROBIC AND ANAEROBIC Blood Culture adequate volume   Culture NO GROWTH 2 DAYS  Final   Report Status PENDING  Incomplete  Culture, blood (routine x 2)     Status: None (Preliminary result)   Collection Time: 08/18/16  7:25 AM  Result Value Ref Range Status   Specimen Description BLOOD R FEM  Final   Special Requests   Final    BOTTLES DRAWN AEROBIC AND ANAEROBIC Blood Culture adequate volume   Culture NO GROWTH 2 DAYS  Final   Report Status PENDING  Incomplete  MRSA PCR Screening     Status: None   Collection Time: 08/18/16 10:39 AM  Result Value Ref Range Status   MRSA by PCR NEGATIVE NEGATIVE Final    Comment:        The GeneXpert MRSA Assay (FDA approved for NASAL specimens only), is one component of a comprehensive MRSA colonization surveillance program. It is not intended to diagnose MRSA infection nor to guide or monitor treatment for MRSA infections.   Culture, blood (routine x 2)     Status: None (Preliminary result)   Collection Time: 08/18/16 11:04 AM  Result Value Ref Range Status   Specimen Description BLOOD R AC  Final   Special Requests   Final    BOTTLES DRAWN AEROBIC AND ANAEROBIC Blood Culture adequate volume   Culture NO GROWTH 2 DAYS  Final   Report Status PENDING  Incomplete  Culture, blood (routine x 2)     Status: None (Preliminary result)   Collection Time: 08/18/16  11:04 AM  Result Value Ref Range Status   Specimen Description BLOOD R HAND  Final   Special Requests   Final    BOTTLES DRAWN AEROBIC AND ANAEROBIC Blood Culture adequate volume   Culture NO GROWTH 2 DAYS  Final   Report Status PENDING  Incomplete    Coagulation Studies:  Recent Labs  08/18/16 0724 08/19/16 0425 08/20/16 0630  LABPROT 41.4* 30.5* 20.6*  INR 4.17* 2.85 1.74    Urinalysis:  Recent Labs  08/18/16 0518  COLORURINE AMBER*  LABSPEC 1.018  PHURINE 5.0  GLUCOSEU NEGATIVE  HGBUR SMALL*  BILIRUBINUR NEGATIVE  KETONESUR 5*  PROTEINUR 100*  NITRITE NEGATIVE  LEUKOCYTESUR NEGATIVE      Imaging: [place] Art/ven Flow Abd Pelv Doppler  Addendum Date: 08/20/2016   ADDENDUM REPORT: 08/20/2016 13:30 ADDENDUM: Incidental note is made of left pleural effusion. Electronically Signed   By: Marijo Conception, M.D.   On: 08/20/2016 13:30   Result Date: 08/20/2016 CLINICAL DATA:  Abnormal liver function tests. EXAM: DUPLEX ULTRASOUND OF LIVER TECHNIQUE: Color and duplex Doppler ultrasound was performed to evaluate the hepatic in-flow and out-flow vessels. COMPARISON:  Ultrasound of September 14, 2014. FINDINGS: Portal Vein Velocities Main:  70 cm/sec Right:  60.5 cm/sec Left:  47.9 cm/sec Pulsatile bidirectional flow is noted in the portal veins. Hepatic Vein Velocities Right:  54.4 cm/sec Middle:  51.5 cm/sec Left:  83.4 cm/sec Pulsatile bidirectional flow is noted in hepatic veins. Hepatic Artery Velocity:  190 cm/sec Splenic Vein Velocity:  33.8 cm/sec Varices: Absent. Ascites: Minimal ascites is noted around the liver and spleen. Spleen is normal in size and appearance. IMPRESSION: No evidence of portal, hepatic or splenic venous thrombosis or occlusion is noted. However, bidirectional flow is noted in the portal and hepatic veins, which was noted on prior exam. This may reflect  portal hypertension secondary to hepatocellular disease, or potentially may represent post hepatic causes such as  cardiac disease resulting in elevated venous pressures. Minimal ascites is noted.  No splenomegaly is noted. Electronically Signed: By: Marijo Conception, M.D. On: 08/20/2016 10:55     Medications:   . calcium gluconate 1 GM IV    . ceFEPime (MAXIPIME) IV Stopped (08/19/16 1801)   . mouth rinse  15 mL Mouth Rinse BID  . tiotropium  18 mcg Inhalation q morning - 10a     Assessment/ Plan:  71 y.o.African-American  male  with history of alcoholism, severe chronic systolic congestive heart failure, history of stroke, hypertension, coronary disease, hyperlipidemia  returns for decreased responsiveness and is found to have low blood sugar, acute renal failure, hyperkalemia and fluid overload   1. Acute renal failure on CKD st 4 with severe hyperkalemia Patient mental status is much better today.  He is not agitated anymore. We discussed how dialysis has helped his situation. He is agreed to take chronic dialysis.  We will initiate the discharge planning process. - Patient is likely dialysis dependent as he developed severe volume overload and electrolyte imbalance within 2 weeks of not taking dialysis. Last HD  treatment was 07/31/16 Results for AIDENJAMES, HECKMANN (MRN [id number]) as of 08/20/2016 15:16  Ref. Range 07/28/2016 05:40 08/18/2016 14:23  Hepatitis B Surface Ag Latest Ref Range: Negative  Negative Negative  Hep B S Ab Unknown Reactive   Hep B Core Ab, IgM Latest Ref Range: Negative   Negative   Place PPD  2. Anemia of CKD  Lab Results  Component Value Date   HGB 7.3 (L) 08/20/2016   Received blood transfusion this admission  3. Severe volume overload with anasarca, abdominal distention and lower extremity edema -  UF with HD as tolerated - clinically improved  4. Severe acidosis -  Improved  5. Underlying CML - f/u at [facility] cancer center with Dr Grayland Ormond    LOS: [place] 5/11/20183:14 PM  [facility] [place], [place]

## 2016-08-20 NOTE — Progress Notes (Signed)
Patient has a PENDING PALLIATIVE referral at [facility] made at last admission. [facility] NP Mariana Kaufman made aware. Updated notes faxed to referral.  Flo Shanks RN, BSN, [place] [facility] [place], [facility] [phone] c

## 2016-08-21 LAB — COMPREHENSIVE METABOLIC PANEL
ALK PHOS: 254 U/L — AB (ref 38–126)
ALT: 790 U/L — AB (ref 17–63)
AST: 780 U/L — ABNORMAL HIGH (ref 15–41)
Albumin: 2.7 g/dL — ABNORMAL LOW (ref 3.5–5.0)
Anion gap: 9 (ref 5–15)
BUN: 43 mg/dL — AB (ref 6–20)
CALCIUM: 7.4 mg/dL — AB (ref 8.9–10.3)
CO2: 26 mmol/L (ref 22–32)
CREATININE: 2.97 mg/dL — AB (ref 0.61–1.24)
Chloride: 99 mmol/L — ABNORMAL LOW (ref 101–111)
GFR calc non Af Amer: 20 mL/min — ABNORMAL LOW (ref >60)
GFR, EST AFRICAN AMERICAN: 23 mL/min — AB (ref >60)
GLUCOSE: 99 mg/dL (ref 65–99)
Potassium: 3.7 mmol/L (ref 3.5–5.1)
SODIUM: 134 mmol/L — AB (ref 135–145)
Total Bilirubin: 4.1 mg/dL — ABNORMAL HIGH (ref 0.3–1.2)
Total Protein: 5.8 g/dL — ABNORMAL LOW (ref 6.5–8.1)

## 2016-08-21 LAB — CBC
HCT: 24 % — ABNORMAL LOW (ref 40.0–52.0)
HEMOGLOBIN: 7.9 g/dL — AB (ref 13.0–18.0)
MCH: 29 pg (ref 26.0–34.0)
MCHC: 32.7 g/dL (ref 32.0–36.0)
MCV: 88.5 fL (ref 80.0–100.0)
Platelets: 555 10*3/uL — ABNORMAL HIGH (ref 150–440)
RBC: 2.71 MIL/uL — AB (ref 4.40–5.90)
RDW: 20 % — ABNORMAL HIGH (ref 11.5–14.5)
WBC: 18 10*3/uL — ABNORMAL HIGH (ref 3.8–10.6)

## 2016-08-21 LAB — CMV DNA BY PCR, QUALITATIVE: CMV DNA, Qual PCR: NEGATIVE

## 2016-08-21 NOTE — Clinical Social Work Note (Signed)
CSW attempted to visit patient at bedside to conduct assessment. The patient was at dialysis. CSW will visit at a later time to speak with the patient regarding discharge planning back to [facility].  Santiago Bumpers, MSW, [place] Presser [phone]

## 2016-08-21 NOTE — Progress Notes (Signed)
Hd completed 

## 2016-08-21 NOTE — Progress Notes (Signed)
CC: someone said my liver was sick  Subjective: Feeling better today. No specific GI complaints. GI ROS is negative.   Objective: Vital signs in last 24 hours: Temp:  [97.7 F (36.5 C)-98.8 F (37.1 C)] 97.7 F (36.5 C) (05/12 1603) Pulse Rate:  [61-73] 63 (05/12 1603) Resp:  [15-24] 16 (05/12 1603) BP: (109-128)/(53-72) 123/62 (05/12 1603) SpO2:  [98 %-100 %] 100 % (05/12 1603) Weight:  [167 lb 12.3 oz (76.1 kg)-173 lb 8 oz (78.7 kg)] 167 lb 12.3 oz (76.1 kg) (05/12 1545) Last BM Date:  (pt unsure)  Intake/Output from previous day: 05/11 0701 - 05/12 0700 In: 120 [P.O.:120] Out: 500 [Urine:500] Intake/Output this shift: Total I/O In: 480 [P.O.:480] Out: 2475 [Urine:475; Other:2000]  General appearance: alert, cooperative and appears stated age Resp: clear to auscultation bilaterally Cardio: regular rate and rhythm and systolic murmur: early systolic 1/6, medium pitch at 2nd left intercostal space GI: soft, non-tender; bowel sounds normal; no masses,  no organomegaly no obvious ascites Neuro: No asterixis or clonus. A & O x 4. Psych: Appropriate judgement. Bright affect.  Lab Results:  Recent Labs  08/19/16 0515 08/20/16 0630 08/21/16 0318  WBC 29.0* 20.6* 18.0*  HGB 7.2* 7.3* 7.9*  HCT 22.5* 21.9* 24.0*  PLT 575* 540* 555*   BMET  Recent Labs  08/19/16 0425 08/20/16 0630 08/21/16 0318  NA 138 136 134*  K 5.1 3.7 3.7  CL 104 102 99*  CO2 21* 26 26  GLUCOSE 86 97 99  BUN 54* 39* 43*  CREATININE 3.55* 2.84* 2.97*  CALCIUM 7.1* 7.2* 7.4*   LFT  Recent Labs  08/21/16 0318  PROT 5.8*  ALBUMIN 2.7*  AST 780*  ALT 790*  ALKPHOS 254*  BILITOT 4.1*   PT/INR  Recent Labs  08/19/16 0425 08/20/16 0630  LABPROT 30.5* 20.6*  INR 2.85 1.74   Hepatitis Panel No results for input(s): HEPBSAG, HCVAB, HEPAIGM, HEPBIGM in the last 72 hours. C-Diff No results for input(s): CDIFFTOX in the last 72 hours. No results for input(s): CDIFFPCR in the last 72  hours. Fecal Lactopherrin No results for input(s): FECLLACTOFRN in the last 72 hours.  Studies/Results: [place] Art/ven Flow Abd Pelv Doppler  Addendum Date: 08/20/2016   ADDENDUM REPORT: 08/20/2016 13:30 ADDENDUM: Incidental note is made of left pleural effusion. Electronically Signed   By: Marijo Conception, M.D.   On: 08/20/2016 13:30   Result Date: 08/20/2016 CLINICAL DATA:  Abnormal liver function tests. EXAM: DUPLEX ULTRASOUND OF LIVER TECHNIQUE: Color and duplex Doppler ultrasound was performed to evaluate the hepatic in-flow and out-flow vessels. COMPARISON:  Ultrasound of September 14, 2014. FINDINGS: Portal Vein Velocities Main:  70 cm/sec Right:  60.5 cm/sec Left:  47.9 cm/sec Pulsatile bidirectional flow is noted in the portal veins. Hepatic Vein Velocities Right:  54.4 cm/sec Middle:  51.5 cm/sec Left:  83.4 cm/sec Pulsatile bidirectional flow is noted in hepatic veins. Hepatic Artery Velocity:  190 cm/sec Splenic Vein Velocity:  33.8 cm/sec Varices: Absent. Ascites: Minimal ascites is noted around the liver and spleen. Spleen is normal in size and appearance. IMPRESSION: No evidence of portal, hepatic or splenic venous thrombosis or occlusion is noted. However, bidirectional flow is noted in the portal and hepatic veins, which was noted on prior exam. This may reflect portal hypertension secondary to hepatocellular disease, or potentially may represent post hepatic causes such as cardiac disease resulting in elevated venous pressures. Minimal ascites is noted.  No splenomegaly is noted. Electronically Signed: By: Jeneen Rinks  Murlean Caller, M.D. On: 08/20/2016 10:55    Medications:  I have reviewed the patient's current medications. Prior to Admission:  Prescriptions Prior to Admission  Medication Sig Dispense Refill Last Dose  . amiodarone (PACERONE) 200 MG tablet Take 1 tablet (200 mg total) by mouth daily. 30 tablet 5 08/17/2016 at 0800  . metFORMIN (GLUCOPHAGE) 500 MG tablet Take 500 mg by mouth 2 (two)  times daily with a meal.   08/17/2016 at 1800  . metoprolol succinate (TOPROL-XL) 25 MG 24 hr tablet Take 0.5 tablets (12.5 mg total) by mouth daily.   08/17/2016 at 0800  . tiotropium (SPIRIVA) 18 MCG inhalation capsule Place 18 mcg into inhaler and inhale daily.   08/17/2016 at 0800  . acetaminophen (TYLENOL) 325 MG tablet Take 2 tablets (650 mg total) by mouth every 4 (four) hours as needed for headache or mild pain. 60 tablet 3 prn at prn  . albuterol (PROVENTIL HFA;VENTOLIN HFA) 108 (90 BASE) MCG/ACT inhaler Inhale 2 puffs into the lungs every 4 (four) hours as needed for wheezing or shortness of breath. (Patient not taking: Reported on 08/18/2016) 1 Inhaler 3 prn at prn  . glycopyrrolate (ROBINUL) 0.2 MG/ML injection Inject 1 mL (0.2 mg total) into the skin every 4 (four) hours as needed (excessive secretions). 50 mL 0 prn at prn  . LORazepam (ATIVAN) 1 MG tablet Take 1 tablet (1 mg total) by mouth every 4 (four) hours as needed for anxiety. 30 tablet 0 prn at prn  . Morphine Sulfate (MORPHINE CONCENTRATE) 10 MG/0.5ML SOLN concentrated solution Place 0.25 mLs (5 mg total) under the tongue every 2 (two) hours as needed for moderate pain (or dyspnea). 100 mL 0 prn at prn  . ondansetron (ZOFRAN) 4 MG tablet Take 1 tablet (4 mg total) by mouth every 6 (six) hours as needed for nausea. 20 tablet 0 prn at prn  . senna-docusate (SENOKOT-S) 8.6-50 MG tablet Take 1 tablet by mouth at bedtime as needed for mild constipation.   prn at prn   Scheduled: . mouth rinse  15 mL Mouth Rinse BID  . tiotropium  18 mcg Inhalation q morning - 10a  . tuberculin  5 Units Intradermal Once   Continuous: . calcium gluconate 1 GM IV    . ceFEPime (MAXIPIME) IV 2 g (08/21/16 1712)   FAO:ZHYQMVHQIONGE **OR** acetaminophen, alum & mag hydroxide-simeth, ipratropium-albuterol, ondansetron **OR** ondansetron (ZOFRAN) IV, senna-docusate  Assessment/Plan: Shock liver - based on labs and improvement over the last 48 hours  His  liver enzymes continue to improve. I expect his bilirubin to decline over the next 24-48 hours.  Labs yesterday show a normalizing INR after vitamin K.  No evidence for hepatic encephalopathy on exam today.  Please continue to follow his liver enzymes and INR daily. Monitor mental status. Maximize perfusion.  May cut back on lactulose given his improved mentation.     LOS: 3 days   [place] 08/21/2016, 6:10 PM

## 2016-08-21 NOTE — Progress Notes (Signed)
POST DIALYSIS ASSESSMENT 

## 2016-08-21 NOTE — Progress Notes (Signed)
HD STARTED  

## 2016-08-21 NOTE — Progress Notes (Signed)
PRE DIALYSIS ASSESSMENT 

## 2016-08-21 NOTE — Progress Notes (Signed)
[place] [facility] [place] NAME: Bruce [name]    MR#:  [id number]  DATE OF BIRTH:  01-17-1946  SUBJECTIVE:   Patient admitted due to altered mental status/encephalopathy and also noted to be volume overloaded and in acute on chronic kidney injury with severe hyperkalemia. Also noted to have acute liver failure with abnormal LFTs, elevated INR.  Mental status improved.  No nausea, abdominal pain. No fever.  REVIEW OF SYSTEMS:    Review of Systems  Constitutional: Negative for chills and fever.  HENT: Negative for congestion and tinnitus.   Eyes: Negative for blurred vision and double vision.  Respiratory: Negative for cough, shortness of breath and wheezing.   Cardiovascular: Negative for chest pain, orthopnea and PND.  Gastrointestinal: Negative for abdominal pain, diarrhea, nausea and vomiting.  Genitourinary: Negative for dysuria and hematuria.  Neurological: Positive for weakness. Negative for dizziness, sensory change and focal weakness.  All other systems reviewed and are negative.  Nutrition: Renal diet Tolerating Diet: Yes Tolerating PT:  Eval noted.  DRUG ALLERGIES:  No Known Allergies  VITALS:  Blood pressure 116/68, pulse 72, temperature 97.8 F (36.6 C), temperature source Oral, resp. rate 20, height 5\' 8"  (1.727 m), weight 78.8 kg (173 lb 12.8 oz), SpO2 98 %.  PHYSICAL EXAMINATION:   Physical Exam  GENERAL:  71 y.o.-year-old patient lying in bed in no acute distress.  EYES: Pupils equal, round, reactive to light and accommodation. No scleral icterus. Extraocular muscles intact.  HEENT: Head atraumatic, normocephalic. Oropharynx and nasopharynx clear.  NECK:  Supple, no jugular venous distention. No thyroid enlargement, no tenderness.  LUNGS: Normal breath sounds bilaterally, no wheezing, rales, rhonchi. No use of accessory muscles of respiration.  CARDIOVASCULAR: S1, S2 normal. No murmurs, rubs, or gallops.  ABDOMEN:  Soft, nontender, nondistended. Bowel sounds present. No organomegaly or mass.  EXTREMITIES: No cyanosis, clubbing or edema b/l.    NEUROLOGIC: Cranial nerves II through XII are intact. No focal Motor or sensory deficits b/l.  Globally weak PSYCHIATRIC: The patient is alert and oriented x 2.  SKIN: No obvious rash, lesion, or ulcer.   Right chest wall Perm cath in place.   LABORATORY PANEL:   CBC  Recent Labs Lab 08/21/16 0318  WBC 18.0*  HGB 7.9*  HCT 24.0*  PLT 555*   ------------------------------------------------------------------------------------------------------------------  Chemistries   Recent Labs Lab 08/18/16 0726  08/21/16 0318  NA 135  < > 134*  K 7.2*  < > 3.7  CL 105  < > 99*  CO2 <7*  < > 26  GLUCOSE 121*  < > 99  BUN 53*  < > 43*  CREATININE 3.63*  < > 2.97*  CALCIUM 8.3*  < > 7.4*  MG 2.2  --   --   AST 629*  < > 780*  ALT 296*  < > 790*  ALKPHOS 273*  < > 254*  BILITOT 3.3*  < > 4.1*  < > = values in this interval not displayed. ------------------------------------------------------------------------------------------------------------------  Cardiac Enzymes  Recent Labs Lab 08/19/16 0337  TROPONINI 0.28*   ------------------------------------------------------------------------------------------------------------------  RADIOLOGY:  [place] Art/ven Flow Abd Pelv Doppler  Addendum Date: 08/20/2016   ADDENDUM REPORT: 08/20/2016 13:30 ADDENDUM: Incidental note is made of left pleural effusion. Electronically Signed   By: Marijo Conception, M.D.   On: 08/20/2016 13:30   Result Date: 08/20/2016 CLINICAL DATA:  Abnormal liver function tests. EXAM: DUPLEX ULTRASOUND OF LIVER TECHNIQUE: Color and duplex Doppler ultrasound  was performed to evaluate the hepatic in-flow and out-flow vessels. COMPARISON:  Ultrasound of September 14, 2014. FINDINGS: Portal Vein Velocities Main:  70 cm/sec Right:  60.5 cm/sec Left:  47.9 cm/sec Pulsatile bidirectional flow is noted  in the portal veins. Hepatic Vein Velocities Right:  54.4 cm/sec Middle:  51.5 cm/sec Left:  83.4 cm/sec Pulsatile bidirectional flow is noted in hepatic veins. Hepatic Artery Velocity:  190 cm/sec Splenic Vein Velocity:  33.8 cm/sec Varices: Absent. Ascites: Minimal ascites is noted around the liver and spleen. Spleen is normal in size and appearance. IMPRESSION: No evidence of portal, hepatic or splenic venous thrombosis or occlusion is noted. However, bidirectional flow is noted in the portal and hepatic veins, which was noted on prior exam. This may reflect portal hypertension secondary to hepatocellular disease, or potentially may represent post hepatic causes such as cardiac disease resulting in elevated venous pressures. Minimal ascites is noted.  No splenomegaly is noted. Electronically Signed: By: Marijo Conception, M.D. On: 08/20/2016 10:55     ASSESSMENT AND PLAN:   71 year old male with past medical history of chronic myelogenous leukemia, chronic kidney disease stage III, hypertension, history of coronary artery disease, chronic combined systolic diastolic CHF who presents to the hospital due to altered mental status.  1. Altered mental status-metabolic encephalopathy secondary to lactic acidosis, volume overload and worsening renal failure. -CT head negative for acute pathology. Mental status much improved after hemodialysis and as the patient's acidosis has corrected.  2. Acute respiratory failure with hypoxia-secondary to volume overload from combined diastolic and systolic CHF and worsening renal failure. -Status post hemodialysis and volume status has improved. he was on high flow nasal cannula now weaned to just nasal cannula 2 L.  3. Hyperkalemia-secondary to acute on chronic renal failure.  - he got insulin, D50 and calcium gluconate in the ER.  -Patient is s/p HD and potassium has now improved.  4. Acute on chronic renal failure-patient's baseline creatinine last month was  close to 2.7 and now back to baseline with HD.  -  he presented with volume overload and hyperkalemia. - Nephro following and was refusing HD yesterday.  NO urgent need for HD today. Palliative care has seen patient He has agreed to continue dialysis at this time. If he stops dialysis in the future will be transitioned to hospice.  5. Acute liver failure-etiology unclear presently. Patient's LFTs improving - appreciate GI input and given vitamin K for the coagulopathy. -Right upper quadrant ultrasound suggestive of portal hypertension.  Doppler US (-) portal or splenic vein thrombosis. Prognosis poor given liver and renal failure and not a transplant candidate as per Gastroenterology.  - follow LFT's.   6. History of CML-patient has chronic leukocytosis, thrombocytosis, and also anemia. - Hg. Stable post transfusion and will cont. To monitor counts.  - consult Oncology if needed.   Palliative care consult to discuss goals of care.   All the records are reviewed and case discussed with Care Management/Social Worker. Management plans discussed with the patient, family and they are in agreement.  CODE STATUS: DNR  DVT Prophylaxis: SCD's  TOTAL TIME TAKING CARE OF THIS PATIENT: 30 minutes.   POSSIBLE D/C IN 2-3 DAYS, DEPENDING ON CLINICAL CONDITION.  Hillary Bow R M.D on 08/21/2016 at 11:57 AM  Between 7am to 6pm - Pager - [phone]  After 6pm go to [email or web address] - Technical brewer [facility]  Office  [phone]  CC: Primary care physician; [name], MD

## 2016-08-21 NOTE — Clinical Social Work Note (Signed)
CSW received consult for outpatient palliative care. CSW does not provide this service. The patient currently has a palliative consult. If he discharges prior to Monday, 08/23/16, please consult the RNCM. CSW signing off.  Santiago Bumpers, MSW, [place]anya Presser [phone]

## 2016-08-21 NOTE — Progress Notes (Addendum)
[facility], [place] 08/21/16  Subjective:    He is currently on room air States he ate his breakfast.  No nausea or vomiting.   + Cough and wheezing today   Objective:  Vital signs in last 24 hours:  Temp:  [97.8 F (36.6 C)-98.8 F (37.1 C)] 97.8 F (36.6 C) (05/12 0440) Pulse Rate:  [68-73] 72 (05/12 0440) Resp:  [18-20] 20 (05/12 0440) BP: (104-124)/(55-68) 116/68 (05/12 0440) SpO2:  [94 %-100 %] 98 % (05/12 0440)  Weight change:  Filed Weights   08/19/16 0658 08/19/16 1013 08/20/16 0429  Weight: 81.5 kg (179 lb 10.8 oz) 79 kg (174 lb 2.6 oz) 78.8 kg (173 lb 12.8 oz)    Intake/Output:    Intake/Output Summary (Last 24 hours) at 08/21/16 1215 Last data filed at 08/21/16 5852  Gross per 24 hour  Intake              360 ml  Output              725 ml  Net             -365 ml     Physical Exam: General: Chronically ill-appearing, NAD  HEENT Muddy sclera,   Neck Supple  Pulm/lungs Normal effort, room air, b/l crackles and wheezing  CVS/Heart Regular rhythm   Abdomen:   distended from ascites,  Extremities: + dependent pitting edema - lower abdomen and Thighs  Neurologic: Alert and oriented  Skin: Warm, no acute rashes   Access: Right IJ PermCath        Basic Metabolic Panel:   Recent Labs Lab 08/18/16 0726 08/18/16 1423 08/18/16 1547 08/18/16 2248 08/19/16 0425 08/20/16 0630 08/21/16 0318  NA 135  --   --   --  138 136 134*  K 7.2*  --  6.2* 5.0 5.1 3.7 3.7  CL 105  --   --   --  104 102 99*  CO2 <7*  --   --   --  21* 26 26  GLUCOSE 121*  --   --   --  86 97 99  BUN 53*  --   --   --  54* 39* 43*  CREATININE 3.63*  --   --   --  3.55* 2.84* 2.97*  CALCIUM 8.3*  --   --   --  7.1* 7.2* 7.4*  MG 2.2  --   --   --   --   --   --   PHOS  --  7.4*  --   --   --   --   --      CBC:  Recent Labs Lab 08/18/16 0518 08/18/16 2124 08/19/16 0515 08/20/16 0630 08/21/16 0318  WBC 27.7*  --  29.0* 20.6* 18.0*   NEUTROABS 24.1*  --   --   --   --   HGB 6.9* 6.5* 7.2* 7.3* 7.9*  HCT 24.3*  --  22.5* 21.9* 24.0*  MCV 105.7*  --  88.1 89.7 88.5  PLT 852*  --  575* 540* 555*      Lab Results  Component Value Date   HEPBSAG Negative 08/18/2016   HEPBSAB Reactive 07/28/2016   HEPBIGM Negative 08/18/2016      Microbiology:  Recent Results (from the past 240 hour(s))  Culture, blood (routine x 2)     Status: None (Preliminary result)   Collection Time: 08/18/16  7:25 AM  Result Value Ref Range Status  Specimen Description BLOOD R FEM  Final   Special Requests   Final    BOTTLES DRAWN AEROBIC AND ANAEROBIC Blood Culture adequate volume   Culture NO GROWTH 3 DAYS  Final   Report Status PENDING  Incomplete  Culture, blood (routine x 2)     Status: None (Preliminary result)   Collection Time: 08/18/16  7:25 AM  Result Value Ref Range Status   Specimen Description BLOOD R FEM  Final   Special Requests   Final    BOTTLES DRAWN AEROBIC AND ANAEROBIC Blood Culture adequate volume   Culture NO GROWTH 3 DAYS  Final   Report Status PENDING  Incomplete  MRSA PCR Screening     Status: None   Collection Time: 08/18/16 10:39 AM  Result Value Ref Range Status   MRSA by PCR NEGATIVE NEGATIVE Final    Comment:        The GeneXpert MRSA Assay (FDA approved for NASAL specimens only), is one component of a comprehensive MRSA colonization surveillance program. It is not intended to diagnose MRSA infection nor to guide or monitor treatment for MRSA infections.   Culture, blood (routine x 2)     Status: None (Preliminary result)   Collection Time: 08/18/16 11:04 AM  Result Value Ref Range Status   Specimen Description BLOOD R AC  Final   Special Requests   Final    BOTTLES DRAWN AEROBIC AND ANAEROBIC Blood Culture adequate volume   Culture NO GROWTH 3 DAYS  Final   Report Status PENDING  Incomplete  Culture, blood (routine x 2)     Status: None (Preliminary result)   Collection Time: 08/18/16  11:04 AM  Result Value Ref Range Status   Specimen Description BLOOD R HAND  Final   Special Requests   Final    BOTTLES DRAWN AEROBIC AND ANAEROBIC Blood Culture adequate volume   Culture NO GROWTH 3 DAYS  Final   Report Status PENDING  Incomplete    Coagulation Studies:  Recent Labs  08/19/16 0425 08/20/16 0630  LABPROT 30.5* 20.6*  INR 2.85 1.74    Urinalysis: No results for input(s): COLORURINE, LABSPEC, PHURINE, GLUCOSEU, HGBUR, BILIRUBINUR, KETONESUR, PROTEINUR, UROBILINOGEN, NITRITE, LEUKOCYTESUR in the last 72 hours.  Invalid input(s): APPERANCEUR    Imaging: [place] Art/ven Flow Abd Pelv Doppler  Addendum Date: 08/20/2016   ADDENDUM REPORT: 08/20/2016 13:30 ADDENDUM: Incidental note is made of left pleural effusion. Electronically Signed   By: Marijo Conception, M.D.   On: 08/20/2016 13:30   Result Date: 08/20/2016 CLINICAL DATA:  Abnormal liver function tests. EXAM: DUPLEX ULTRASOUND OF LIVER TECHNIQUE: Color and duplex Doppler ultrasound was performed to evaluate the hepatic in-flow and out-flow vessels. COMPARISON:  Ultrasound of September 14, 2014. FINDINGS: Portal Vein Velocities Main:  70 cm/sec Right:  60.5 cm/sec Left:  47.9 cm/sec Pulsatile bidirectional flow is noted in the portal veins. Hepatic Vein Velocities Right:  54.4 cm/sec Middle:  51.5 cm/sec Left:  83.4 cm/sec Pulsatile bidirectional flow is noted in hepatic veins. Hepatic Artery Velocity:  190 cm/sec Splenic Vein Velocity:  33.8 cm/sec Varices: Absent. Ascites: Minimal ascites is noted around the liver and spleen. Spleen is normal in size and appearance. IMPRESSION: No evidence of portal, hepatic or splenic venous thrombosis or occlusion is noted. However, bidirectional flow is noted in the portal and hepatic veins, which was noted on prior exam. This may reflect portal hypertension secondary to hepatocellular disease, or potentially may represent post hepatic causes such as cardiac disease  resulting in elevated venous  pressures. Minimal ascites is noted.  No splenomegaly is noted. Electronically Signed: By: Marijo Conception, M.D. On: 08/20/2016 10:55     Medications:   . calcium gluconate 1 GM IV    . ceFEPime (MAXIPIME) IV Stopped (08/20/16 1818)   . mouth rinse  15 mL Mouth Rinse BID  . tiotropium  18 mcg Inhalation q morning - 10a  . tuberculin  5 Units Intradermal Once     Assessment/ Plan:  71 y.o.African-American  male  with history of alcoholism, severe chronic systolic congestive heart failure, history of stroke, hypertension, coronary disease, hyperlipidemia  returns for decreased responsiveness and is found to have low blood sugar, acute renal failure, hyperkalemia and fluid overload   1. Acute renal failure on CKD st 4 with severe hyperkalemia Patient's mental status is now back to baseline.   - Patient is likely dialysis dependent as he developed severe volume overload and electrolyte imbalance within 2 weeks of not taking dialysis.   Results for SINAN, TUCH (MRN [id number]) as of 08/20/2016 15:16  Ref. Range 07/28/2016 05:40 08/18/2016 14:23  Hepatitis B Surface Ag Latest Ref Range: Negative  Negative Negative  Hep B S Ab Unknown Reactive   Hep B Core Ab, IgM Latest Ref Range: Negative   Negative   Placed PPD D/c planning Dialysis today for volume optimization   2. Anemia of CKD  Lab Results  Component Value Date   HGB 7.9 (L) 08/21/2016   Received blood transfusion this admission  3. Severe volume overload with anasarca, abdominal distention and lower extremity edema -  UF with HD as tolerated - clinically improved  4. Severe acidosis -  Improved  5. Underlying CML - f/u at [facility] with Dr Grayland Ormond    LOS: 3 Sincere Liuzzi 5/12/201812:15 PM  [facility] [place], [place]

## 2016-08-22 LAB — COMPREHENSIVE METABOLIC PANEL
ALT: 542 U/L — AB (ref 17–63)
AST: 373 U/L — ABNORMAL HIGH (ref 15–41)
Albumin: 2.5 g/dL — ABNORMAL LOW (ref 3.5–5.0)
Alkaline Phosphatase: 233 U/L — ABNORMAL HIGH (ref 38–126)
Anion gap: 7 (ref 5–15)
BUN: 24 mg/dL — ABNORMAL HIGH (ref 6–20)
CHLORIDE: 102 mmol/L (ref 101–111)
CO2: 28 mmol/L (ref 22–32)
CREATININE: 1.87 mg/dL — AB (ref 0.61–1.24)
Calcium: 7.6 mg/dL — ABNORMAL LOW (ref 8.9–10.3)
GFR, EST AFRICAN AMERICAN: 40 mL/min — AB (ref >60)
GFR, EST NON AFRICAN AMERICAN: 35 mL/min — AB (ref >60)
Glucose, Bld: 133 mg/dL — ABNORMAL HIGH (ref 65–99)
POTASSIUM: 3.5 mmol/L (ref 3.5–5.1)
Sodium: 137 mmol/L (ref 135–145)
Total Bilirubin: 2.6 mg/dL — ABNORMAL HIGH (ref 0.3–1.2)
Total Protein: 5.5 g/dL — ABNORMAL LOW (ref 6.5–8.1)

## 2016-08-22 LAB — CBC WITH DIFFERENTIAL/PLATELET
BASOS ABS: 0.5 10*3/uL — AB (ref 0–0.1)
Basophils Relative: 3 %
EOS PCT: 2 %
Eosinophils Absolute: 0.3 10*3/uL (ref 0–0.7)
HCT: 24.4 % — ABNORMAL LOW (ref 40.0–52.0)
Hemoglobin: 8 g/dL — ABNORMAL LOW (ref 13.0–18.0)
Lymphocytes Relative: 6 %
Lymphs Abs: 0.9 10*3/uL — ABNORMAL LOW (ref 1.0–3.6)
MCH: 28.9 pg (ref 26.0–34.0)
MCHC: 32.7 g/dL (ref 32.0–36.0)
MCV: 88.4 fL (ref 80.0–100.0)
MONO ABS: 2.7 10*3/uL — AB (ref 0.2–1.0)
Monocytes Relative: 18 %
NEUTROS PCT: 71 %
Neutro Abs: 10.7 10*3/uL — ABNORMAL HIGH (ref 1.4–6.5)
Platelets: 622 10*3/uL — ABNORMAL HIGH (ref 150–440)
RBC: 2.77 MIL/uL — AB (ref 4.40–5.90)
RDW: 20.3 % — ABNORMAL HIGH (ref 11.5–14.5)
WBC: 15.1 10*3/uL — AB (ref 3.8–10.6)

## 2016-08-22 MED ORDER — ALBUMIN HUMAN 25 % IV SOLN
12.5000 g | Freq: Once | INTRAVENOUS | Status: DC
  Filled 2016-08-22: qty 50

## 2016-08-22 NOTE — Progress Notes (Signed)
[facility] [place] NAME: Bruce [name]    MR#:  [id number]  DATE OF BIRTH:  11-Jul-1945  SUBJECTIVE:   Patient admitted due to altered mental status/encephalopathy and also noted to be volume overloaded and in acute on chronic kidney injury with severe hyperkalemia. Also noted to have acute liver failure with abnormal LFTs, elevated INR.  Mental status improved.  Feels well. Some weakness. Afebrile Had HD yesterday  REVIEW OF SYSTEMS:    Review of Systems  Constitutional: Negative for chills and fever.  HENT: Negative for congestion and tinnitus.   Eyes: Negative for blurred vision and double vision.  Respiratory: Negative for cough, shortness of breath and wheezing.   Cardiovascular: Negative for chest pain, orthopnea and PND.  Gastrointestinal: Negative for abdominal pain, diarrhea, nausea and vomiting.  Genitourinary: Negative for dysuria and hematuria.  Neurological: Positive for weakness. Negative for dizziness, sensory change and focal weakness.  All other systems reviewed and are negative.  Nutrition: Renal diet Tolerating Diet: Yes Tolerating PT:  Eval noted.  DRUG ALLERGIES:  No Known Allergies  VITALS:  Blood pressure (!) 112/54, pulse 76, temperature 98.1 F (36.7 C), temperature source Oral, resp. rate 16, height 5\' 8"  (1.727 m), weight 76.1 kg (167 lb 12.3 oz), SpO2 98 %.  PHYSICAL EXAMINATION:   Physical Exam  GENERAL:  71 y.o.-year-old patient lying in bed in no acute distress.  EYES: Pupils equal, round, reactive to light and accommodation. No scleral icterus. Extraocular muscles intact.  HEENT: Head atraumatic, normocephalic. Oropharynx and nasopharynx clear.  NECK:  Supple, no jugular venous distention. No thyroid enlargement, no tenderness.  LUNGS: Normal breath sounds bilaterally, no wheezing, rales, rhonchi. No use of accessory muscles of respiration.  CARDIOVASCULAR: S1, S2 normal. No murmurs, rubs, or  gallops.  ABDOMEN: Soft, nontender, nondistended. Bowel sounds present. No organomegaly or mass.  EXTREMITIES: No cyanosis, clubbing or edema b/l.    NEUROLOGIC: Cranial nerves II through XII are intact. No focal Motor or sensory deficits b/l.  Globally weak PSYCHIATRIC: The patient is alert and oriented x 2.  SKIN: No obvious rash, lesion, or ulcer.   Right chest wall Perm cath in place.   LABORATORY PANEL:   CBC  Recent Labs Lab 08/22/16 0428  WBC 15.1*  HGB 8.0*  HCT 24.4*  PLT 622*   ------------------------------------------------------------------------------------------------------------------  Chemistries   Recent Labs Lab 08/18/16 0726  08/22/16 0428  NA 135  < > 137  K 7.2*  < > 3.5  CL 105  < > 102  CO2 <7*  < > 28  GLUCOSE 121*  < > 133*  BUN 53*  < > 24*  CREATININE 3.63*  < > 1.87*  CALCIUM 8.3*  < > 7.6*  MG 2.2  --   --   AST 629*  < > 373*  ALT 296*  < > 542*  ALKPHOS 273*  < > 233*  BILITOT 3.3*  < > 2.6*  < > = values in this interval not displayed. ------------------------------------------------------------------------------------------------------------------  Cardiac Enzymes  Recent Labs Lab 08/19/16 0337  TROPONINI 0.28*   ------------------------------------------------------------------------------------------------------------------  RADIOLOGY:  No results found.   ASSESSMENT AND PLAN:   70 year old male with past medical history of chronic myelogenous leukemia, chronic kidney disease stage III, hypertension, history of coronary artery disease, chronic combined systolic diastolic CHF who presents to the hospital due to altered mental status.  1. Altered mental status-metabolic encephalopathy secondary to lactic acidosis, volume overload and worsening  renal failure. -CT head negative for acute pathology. Mental status much improved after hemodialysis and as the patient's acidosis has corrected.  2. Acute respiratory failure  with hypoxia-secondary to volume overload from combined diastolic and systolic CHF and worsening renal failure. -Status post hemodialysis and volume status has improved.  Was on HFNC. Now On RA  3. Hyperkalemia-secondary to acute on chronic renal failure.  Resolved  4. Acute on chronic renal failure-patient's baseline creatinine last month was close to 2.7 and now back to baseline with HD.  -  he presented with volume overload and hyperkalemia. - Nephro following and was refusing HD yesterday.  NO urgent need for HD today. Palliative care has seen patient He has agreed to continue dialysis at this time.  If he stops dialysis in the future will be transitioned to hospice.  5. Acute liver failure-etiology unclear presently. Patient's LFTs improving - appreciate GI input and given vitamin K for the coagulopathy. -Right upper quadrant ultrasound suggestive of portal hypertension.  Doppler US (-) portal or splenic vein thrombosis.  - Improving  6. History of CML-patient has chronic leukocytosis, thrombocytosis, and also anemia. - Hg. Stable post transfusion and will cont. To monitor counts.  - consult Oncology if needed.   All the records are reviewed and case discussed with Care Management/Social Worker. Management plans discussed with the patient, family and they are in agreement.  CODE STATUS: DNR  DVT Prophylaxis: SCD's  TOTAL TIME TAKING CARE OF THIS PATIENT: 30 minutes.   POSSIBLE D/C IN 2-3 DAYS, DEPENDING ON CLINICAL CONDITION.  Hillary Bow R M.D on 08/22/2016 at 11:05 AM  Between 7am to 6pm - Pager - [phone]  After 6pm go to [email or web address] - [employer] [facility]  Office  [phone]  CC: Primary care physician; [name], MD

## 2016-08-22 NOTE — Clinical Social Work Note (Signed)
Clinical Social Work Assessment  Patient Details  Name: Bruce [name] MRN: [id number] Date of Birth: 03-Dec-1945  Date of referral:  08/22/16               Reason for consult:  Facility Placement                Permission sought to share information with:  Facility Art therapist granted to share information::  Yes, Verbal Permission Granted  Name::        Agency::     Relationship::     Contact Information:     Housing/Transportation Living arrangements for the past 2 months:  Homeless, [place] of Information:  Patient, Facility, Medical Team Patient Interpreter Needed:  None Criminal Activity/Legal Involvement Pertinent to Current Situation/Hospitalization:  No - Comment as needed Significant Relationships:  Other Family Members Lives with:  Facility Resident Do you feel safe going back to the place where you live?  Yes Need for family participation in patient care:  No (Coment)  Care giving concerns:  Patient admitted from [place] Worker assessment / plan:  CSW met with the patient and his nephew at bedside to discuss discharge planning and acceptance of new diagnosis of ESRD. The patient reported that he is a STR resident at [facility] and plans to return when stable. The patient is now amendable to permanent HD and has had a PD placed to begin the process of outpatient dialysis referral.  At baseline, the patient is homeless, and he indicates that he is worried about his long term options for assistance considering his new needs. The CSW assured the patient that the SW at his facility can assist with long term planning. The CSW gave general information about life on dialysis and what to expect. The patient indicated that he is starting to understand that the process takes time, and he is willing to participate.  CSW will follow up with [facility] to have SW assist the  patient after discharge from the hospital.   Employment status:  Retired Insurance underwriter information:  Managed Medicare PT Recommendations:  [place] / Referral to community resources:     Patient/Family's Response to care:  The patient thanked the CSW for assistance.  Patient/Family's Understanding of and Emotional Response to Diagnosis, Current Treatment, and Prognosis:  The patient is beginning to come to terms with the changes in his medical disposition, and he is willing to consider long term placement if needed.  Emotional Assessment Appearance:  Appears younger than stated age Attitude/Demeanor/Rapport:  Lethargic (Pleasant) Affect (typically observed):  Accepting, Appropriate, Pleasant Orientation:  Oriented to Self, Oriented to Place, Oriented to  Time, Oriented to Situation Alcohol / Substance use:  Alcohol Use Psych involvement (Current and /or in the community):  No (Comment)  Discharge Needs  Concerns to be addressed:  Adjustment to Illness, Care Coordination, Discharge Planning Concerns Readmission within the last 30 days:  Yes Current discharge risk:  Chronically ill, Homeless Barriers to Discharge:  Continued Medical Work up   [employer], LCSW 08/22/2016, 1:47 PM

## 2016-08-22 NOTE — Progress Notes (Signed)
CC: I feel good today  Subjective: Since I last evaluated the patient he has continued to make clinical improvement. He has no GI or liver complaints today. Proud of himself because he was up and walking   Objective: Vital signs in last 24 hours: Temp:  [98 F (36.7 C)-98.4 F (36.9 C)] 98 F (36.7 C) (05/13 1227) Pulse Rate:  [67-76] 67 (05/13 1227) Resp:  [16-20] 20 (05/13 1227) BP: (112-125)/(49-66) 125/66 (05/13 1227) SpO2:  [98 %-100 %] 100 % (05/13 1227) Last BM Date: 08/22/16  Intake/Output from previous day: 05/12 0701 - 05/13 0700 In: 598 [P.O.:598] Out: 3276 [Urine:1275; Stool:1] Intake/Output this shift: Total I/O In: 480 [P.O.:480] Out: 200 [Urine:200]  General appearance: alert, cooperative and appears stated age Resp: clear to auscultation bilaterally Cardio: regular rate and rhythm and systolic murmur: early systolic 1/6, medium pitch at 2nd left intercostal space GI: soft, non-tender; bowel sounds normal; no masses,  no organomegaly no obvious ascites Neuro: No asterixis or clonus. A & O x 4. Psych: Appropriate judgement. Bright affect.  Lab Results:  Recent Labs  08/20/16 0630 08/21/16 0318 08/22/16 0428  WBC 20.6* 18.0* 15.1*  HGB 7.3* 7.9* 8.0*  HCT 21.9* 24.0* 24.4*  PLT 540* 555* 622*   BMET  Recent Labs  08/20/16 0630 08/21/16 0318 08/22/16 0428  NA 136 134* 137  K 3.7 3.7 3.5  CL 102 99* 102  CO2 26 26 28   GLUCOSE 97 99 133*  BUN 39* 43* 24*  CREATININE 2.84* 2.97* 1.87*  CALCIUM 7.2* 7.4* 7.6*   LFT  Recent Labs  08/22/16 0428  PROT 5.5*  ALBUMIN 2.5*  AST 373*  ALT 542*  ALKPHOS 233*  BILITOT 2.6*   PT/INR  Recent Labs  08/20/16 0630  LABPROT 20.6*  INR 1.74   Hepatitis Panel No results for input(s): HEPBSAG, HCVAB, HEPAIGM, HEPBIGM in the last 72 hours. C-Diff No results for input(s): CDIFFTOX in the last 72 hours. No results for input(s): CDIFFPCR in the last 72 hours. Fecal Lactopherrin No results for  input(s): FECLLACTOFRN in the last 72 hours.  Studies/Results: No results found.  Medications:  I have reviewed the patient's current medications. Prior to Admission:  Prescriptions Prior to Admission  Medication Sig Dispense Refill Last Dose  . amiodarone (PACERONE) 200 MG tablet Take 1 tablet (200 mg total) by mouth daily. 30 tablet 5 08/17/2016 at 0800  . metFORMIN (GLUCOPHAGE) 500 MG tablet Take 500 mg by mouth 2 (two) times daily with a meal.   08/17/2016 at 1800  . metoprolol succinate (TOPROL-XL) 25 MG 24 hr tablet Take 0.5 tablets (12.5 mg total) by mouth daily.   08/17/2016 at 0800  . tiotropium (SPIRIVA) 18 MCG inhalation capsule Place 18 mcg into inhaler and inhale daily.   08/17/2016 at 0800  . acetaminophen (TYLENOL) 325 MG tablet Take 2 tablets (650 mg total) by mouth every 4 (four) hours as needed for headache or mild pain. 60 tablet 3 prn at prn  . albuterol (PROVENTIL HFA;VENTOLIN HFA) 108 (90 BASE) MCG/ACT inhaler Inhale 2 puffs into the lungs every 4 (four) hours as needed for wheezing or shortness of breath. (Patient not taking: Reported on 08/18/2016) 1 Inhaler 3 prn at prn  . glycopyrrolate (ROBINUL) 0.2 MG/ML injection Inject 1 mL (0.2 mg total) into the skin every 4 (four) hours as needed (excessive secretions). 50 mL 0 prn at prn  . LORazepam (ATIVAN) 1 MG tablet Take 1 tablet (1 mg total) by mouth every  4 (four) hours as needed for anxiety. 30 tablet 0 prn at prn  . Morphine Sulfate (MORPHINE CONCENTRATE) 10 MG/0.5ML SOLN concentrated solution Place 0.25 mLs (5 mg total) under the tongue every 2 (two) hours as needed for moderate pain (or dyspnea). 100 mL 0 prn at prn  . ondansetron (ZOFRAN) 4 MG tablet Take 1 tablet (4 mg total) by mouth every 6 (six) hours as needed for nausea. 20 tablet 0 prn at prn  . senna-docusate (SENOKOT-S) 8.6-50 MG tablet Take 1 tablet by mouth at bedtime as needed for mild constipation.   prn at prn   Scheduled: . mouth rinse  15 mL Mouth Rinse BID   . tiotropium  18 mcg Inhalation q morning - 10a  . tuberculin  5 Units Intradermal Once   Continuous: . [START ON 08/23/2016] albumin human    . calcium gluconate 1 GM IV     VWP:[id number] **OR** acetaminophen, alum & mag hydroxide-simeth, ipratropium-albuterol, ondansetron **OR** ondansetron (ZOFRAN) IV, senna-docusate  Assessment/Plan: Shock liver - based on labs and improvement over the last 72 hours  His liver enzymes continue to improve, Even his bilirugin has started to normalize. No evidence for hepatic encephalopathy on exam today.  Please continue to follow his liver enzymes to confirm that they normalize. I would repeat the INR one more time to confirm continued improvement. No need for additional lactulose.  I will move to standby. Please call the on-call gastroenterologist with any additional questions or concerns during this hospitalization.   LOS: 4 days   [place] 08/22/2016, 5:05 PM

## 2016-08-22 NOTE — Progress Notes (Signed)
[facility], [place] 08/22/16  Subjective:    He is currently on room air States he did wel with HD yesterday.  No nausea or vomiting.   + Cough is better Still has large amount of edema   Objective:  Vital signs in last 24 hours:  Temp:  [97.7 F (36.5 C)-98.4 F (36.9 C)] 98.1 F (36.7 C) (05/13 0503) Pulse Rate:  [61-76] 76 (05/13 0503) Resp:  [15-24] 16 (05/13 0503) BP: (109-128)/(49-72) 112/54 (05/13 0503) SpO2:  [98 %-100 %] 98 % (05/13 0503) Weight:  [76.1 kg (167 lb 12.3 oz)-78.7 kg (173 lb 8 oz)] 76.1 kg (167 lb 12.3 oz) (05/12 1545)  Weight change:  Filed Weights   08/20/16 0429 08/21/16 1240 08/21/16 1545  Weight: 78.8 kg (173 lb 12.8 oz) 78.7 kg (173 lb 8 oz) 76.1 kg (167 lb 12.3 oz)    Intake/Output:    Intake/Output Summary (Last 24 hours) at 08/22/16 1153 Last data filed at 08/22/16 1001  Gross per 24 hour  Intake              598 ml  Output             3251 ml  Net            -2653 ml     Physical Exam: General: Chronically ill-appearing, NAD  HEENT Muddy sclera,   Neck Supple  Pulm/lungs Normal effort, room air, b/l crackles and wheezing  CVS/Heart Regular rhythm   Abdomen:   distended from ascites,  Extremities: 2+ dependent pitting edema - lower abdomen and Thighs  Neurologic: Alert and oriented  Skin: Warm, no acute rashes   Access: Right IJ PermCath        Basic Metabolic Panel:   Recent Labs Lab 08/18/16 0726 08/18/16 1423  08/18/16 2248 08/19/16 0425 08/20/16 0630 08/21/16 0318 08/22/16 0428  NA 135  --   --   --  138 136 134* 137  K 7.2*  --   < > 5.0 5.1 3.7 3.7 3.5  CL 105  --   --   --  104 102 99* 102  CO2 <7*  --   --   --  21* 26 26 28   GLUCOSE 121*  --   --   --  86 97 99 133*  BUN 53*  --   --   --  54* 39* 43* 24*  CREATININE 3.63*  --   --   --  3.55* 2.84* 2.97* 1.87*  CALCIUM 8.3*  --   --   --  7.1* 7.2* 7.4* 7.6*  MG 2.2  --   --   --   --   --   --   --   PHOS  --  7.4*  --    --   --   --   --   --   < > = values in this interval not displayed.   CBC:  Recent Labs Lab 08/18/16 0518 08/18/16 2124 08/19/16 0515 08/20/16 0630 08/21/16 0318 08/22/16 0428  WBC 27.7*  --  29.0* 20.6* 18.0* 15.1*  NEUTROABS 24.1*  --   --   --   --  10.7*  HGB 6.9* 6.5* 7.2* 7.3* 7.9* 8.0*  HCT 24.3*  --  22.5* 21.9* 24.0* 24.4*  MCV 105.7*  --  88.1 89.7 88.5 88.4  PLT 852*  --  575* 540* 555* 622*      Lab Results  Component Value  Date   HEPBSAG Negative 08/18/2016   HEPBSAB Reactive 07/28/2016   HEPBIGM Negative 08/18/2016      Microbiology:  Recent Results (from the past 240 hour(s))  Culture, blood (routine x 2)     Status: None (Preliminary result)   Collection Time: 08/18/16  7:25 AM  Result Value Ref Range Status   Specimen Description BLOOD R FEM  Final   Special Requests   Final    BOTTLES DRAWN AEROBIC AND ANAEROBIC Blood Culture adequate volume   Culture NO GROWTH 4 DAYS  Final   Report Status PENDING  Incomplete  Culture, blood (routine x 2)     Status: None (Preliminary result)   Collection Time: 08/18/16  7:25 AM  Result Value Ref Range Status   Specimen Description BLOOD R FEM  Final   Special Requests   Final    BOTTLES DRAWN AEROBIC AND ANAEROBIC Blood Culture adequate volume   Culture NO GROWTH 4 DAYS  Final   Report Status PENDING  Incomplete  MRSA PCR Screening     Status: None   Collection Time: 08/18/16 10:39 AM  Result Value Ref Range Status   MRSA by PCR NEGATIVE NEGATIVE Final    Comment:        The GeneXpert MRSA Assay (FDA approved for NASAL specimens only), is one component of a comprehensive MRSA colonization surveillance program. It is not intended to diagnose MRSA infection nor to guide or monitor treatment for MRSA infections.   Culture, blood (routine x 2)     Status: None (Preliminary result)   Collection Time: 08/18/16 11:04 AM  Result Value Ref Range Status   Specimen Description BLOOD R AC  Final    Special Requests   Final    BOTTLES DRAWN AEROBIC AND ANAEROBIC Blood Culture adequate volume   Culture NO GROWTH 4 DAYS  Final   Report Status PENDING  Incomplete  Culture, blood (routine x 2)     Status: None (Preliminary result)   Collection Time: 08/18/16 11:04 AM  Result Value Ref Range Status   Specimen Description BLOOD R HAND  Final   Special Requests   Final    BOTTLES DRAWN AEROBIC AND ANAEROBIC Blood Culture adequate volume   Culture NO GROWTH 4 DAYS  Final   Report Status PENDING  Incomplete    Coagulation Studies:  Recent Labs  08/20/16 0630  LABPROT 20.6*  INR 1.74    Urinalysis: No results for input(s): COLORURINE, LABSPEC, PHURINE, GLUCOSEU, HGBUR, BILIRUBINUR, KETONESUR, PROTEINUR, UROBILINOGEN, NITRITE, LEUKOCYTESUR in the last 72 hours.  Invalid input(s): APPERANCEUR    Imaging: No results found.   Medications:   . calcium gluconate 1 GM IV     . mouth rinse  15 mL Mouth Rinse BID  . tiotropium  18 mcg Inhalation q morning - 10a  . tuberculin  5 Units Intradermal Once     Assessment/ Plan:  71 y.o.African-American  male  with history of alcoholism, severe chronic systolic congestive heart failure, history of stroke, hypertension, coronary disease, hyperlipidemia  returns for decreased responsiveness and is found to have low blood sugar, acute renal failure, hyperkalemia and fluid overload   1. Acute renal failure on CKD st 4 with severe hyperkalemia, likely ESRD Patient's mental status is now back to baseline.   - Patient is likely dialysis dependent as he developed severe volume overload and electrolyte imbalance within 2 weeks of not taking dialysis.   Results for Bruce [name], Bruce [name] (MRN [id number]) as of 08/20/2016  15:16  Ref. Range 07/28/2016 05:40 08/18/2016 14:23  Hepatitis B Surface Ag Latest Ref Range: Negative  Negative Negative  Hep B S Ab Unknown Reactive   Hep B Core Ab, IgM Latest Ref Range: Negative   Negative   Placed PPD  08/20/16 D/c planning    2. Anemia of CKD  Lab Results  Component Value Date   HGB 8.0 (L) 08/22/2016   Received blood transfusion this admission  3. Severe volume overload with anasarca, abdominal distention and lower extremity edema -  UF with HD as tolerated - clinically improved Dialysis tomorrow 2000 cc removed with HD on saturday  4. Severe acidosis -  Improved  5. Underlying CML - f/u at [facility] cancer center with Dr Grayland Ormond    LOS: 4 Bruce [name] 5/13/201811:53 AM  [facility] [place], [place]

## 2016-08-23 LAB — COMPREHENSIVE METABOLIC PANEL
ALT: 406 U/L — ABNORMAL HIGH (ref 17–63)
ANION GAP: 8 (ref 5–15)
AST: 212 U/L — ABNORMAL HIGH (ref 15–41)
Albumin: 2.6 g/dL — ABNORMAL LOW (ref 3.5–5.0)
Alkaline Phosphatase: 201 U/L — ABNORMAL HIGH (ref 38–126)
BUN: 28 mg/dL — AB (ref 6–20)
CHLORIDE: 103 mmol/L (ref 101–111)
CO2: 26 mmol/L (ref 22–32)
Calcium: 7.6 mg/dL — ABNORMAL LOW (ref 8.9–10.3)
Creatinine, Ser: 1.77 mg/dL — ABNORMAL HIGH (ref 0.61–1.24)
GFR calc Af Amer: 43 mL/min — ABNORMAL LOW (ref >60)
GFR calc non Af Amer: 37 mL/min — ABNORMAL LOW (ref >60)
GLUCOSE: 106 mg/dL — AB (ref 65–99)
POTASSIUM: 3.4 mmol/L — AB (ref 3.5–5.1)
Sodium: 137 mmol/L (ref 135–145)
Total Bilirubin: 1.7 mg/dL — ABNORMAL HIGH (ref 0.3–1.2)
Total Protein: 5.3 g/dL — ABNORMAL LOW (ref 6.5–8.1)

## 2016-08-23 LAB — CULTURE, BLOOD (ROUTINE X 2)
CULTURE: NO GROWTH
Culture: NO GROWTH
Culture: NO GROWTH
Culture: NO GROWTH
SPECIAL REQUESTS: ADEQUATE
Special Requests: ADEQUATE
Special Requests: ADEQUATE
Special Requests: ADEQUATE

## 2016-08-23 LAB — CBC WITH DIFFERENTIAL/PLATELET
BAND NEUTROPHILS: 0 %
BASOS ABS: 0 10*3/uL (ref 0–0.1)
BASOS PCT: 0 %
BLASTS: 0 %
EOS PCT: 2 %
Eosinophils Absolute: 0.3 10*3/uL (ref 0–0.7)
HCT: 25.4 % — ABNORMAL LOW (ref 40.0–52.0)
HEMOGLOBIN: 8.4 g/dL — AB (ref 13.0–18.0)
LYMPHS PCT: 10 %
Lymphs Abs: 1.6 10*3/uL (ref 1.0–3.6)
MCH: 29.3 pg (ref 26.0–34.0)
MCHC: 33.1 g/dL (ref 32.0–36.0)
MCV: 88.6 fL (ref 80.0–100.0)
METAMYELOCYTES PCT: 0 %
MONOS PCT: 14 %
Monocytes Absolute: 2.3 10*3/uL — ABNORMAL HIGH (ref 0.2–1.0)
Myelocytes: 0 %
NRBC: 0 /100{WBCs}
Neutro Abs: 12 10*3/uL — ABNORMAL HIGH (ref 1.4–6.5)
Neutrophils Relative %: 74 %
Other: 0 %
Platelets: 646 10*3/uL — ABNORMAL HIGH (ref 150–440)
Promyelocytes Absolute: 0 %
RBC: 2.87 MIL/uL — ABNORMAL LOW (ref 4.40–5.90)
RDW: 20.5 % — ABNORMAL HIGH (ref 11.5–14.5)
WBC: 16.2 10*3/uL — ABNORMAL HIGH (ref 3.8–10.6)

## 2016-08-23 LAB — PROTIME-INR
INR: 1.2
PROTHROMBIN TIME: 15.3 s — AB (ref 11.4–15.2)

## 2016-08-23 LAB — VARICELLA-ZOSTER BY PCR

## 2016-08-23 NOTE — Progress Notes (Signed)
[place]

## 2016-08-23 NOTE — Progress Notes (Signed)
Hd tx ended 

## 2016-08-23 NOTE — Progress Notes (Signed)
[facility], [place] 08/23/16  Subjective:  Patient seen and evaluated during hemodialysis. Urine output yesterday was 850 cc.   Objective:  Vital signs in last 24 hours:  Temp:  [97.7 F (36.5 C)] 97.7 F (36.5 C) (05/14 1047) Pulse Rate:  [59-82] 59 (05/14 1400) Resp:  [12-25] 12 (05/14 1400) BP: (114-131)/(61-98) 128/66 (05/14 1400) SpO2:  [94 %-100 %] 100 % (05/14 1400)  Weight change:  Filed Weights   08/20/16 0429 08/21/16 1240 08/21/16 1545  Weight: 78.8 kg (173 lb 12.8 oz) 78.7 kg (173 lb 8 oz) 76.1 kg (167 lb 12.3 oz)    Intake/Output:    Intake/Output Summary (Last 24 hours) at 08/23/16 1419 Last data filed at 08/23/16 1038  Gross per 24 hour  Intake              600 ml  Output             1025 ml  Net             -425 ml     Physical Exam: General: Chronically ill-appearing, NAD  HEENT Muddy sclera,  Neck Supple  Pulm/lungs Basilar rales, normal effort  CVS/Heart Regular rhythm, no rubs  Abdomen:  distended from ascites, soft  Extremities: 2+ dependent pitting edema  Neurologic: Alert and oriented, follows commands  Skin: Warm, no acute rashes   Access: Right IJ PermCath        Basic Metabolic Panel:   Recent Labs Lab 08/18/16 0726 08/18/16 1423  08/19/16 0425 08/20/16 0630 08/21/16 0318 08/22/16 0428 08/23/16 0324  NA 135  --   --  138 136 134* 137 137  K 7.2*  --   < > 5.1 3.7 3.7 3.5 3.4*  CL 105  --   --  104 102 99* 102 103  CO2 <7*  --   --  21* 26 26 28 26   GLUCOSE 121*  --   --  86 97 99 133* 106*  BUN 53*  --   --  54* 39* 43* 24* 28*  CREATININE 3.63*  --   --  3.55* 2.84* 2.97* 1.87* 1.77*  CALCIUM 8.3*  --   --  7.1* 7.2* 7.4* 7.6* 7.6*  MG 2.2  --   --   --   --   --   --   --   PHOS  --  7.4*  --   --   --   --   --   --   < > = values in this interval not displayed.   CBC:  Recent Labs Lab 08/18/16 0518  08/19/16 0515 08/20/16 0630 08/21/16 0318 08/22/16 0428 08/23/16 0324  WBC  27.7*  --  29.0* 20.6* 18.0* 15.1* 16.2*  NEUTROABS 24.1*  --   --   --   --  10.7* 12.0*  HGB 6.9*  < > 7.2* 7.3* 7.9* 8.0* 8.4*  HCT 24.3*  --  22.5* 21.9* 24.0* 24.4* 25.4*  MCV 105.7*  --  88.1 89.7 88.5 88.4 88.6  PLT 852*  --  575* 540* 555* 622* 646*  < > = values in this interval not displayed.    Lab Results  Component Value Date   HEPBSAG Negative 08/18/2016   HEPBSAB Reactive 07/28/2016   HEPBIGM Negative 08/18/2016      Microbiology:  Recent Results (from the past 240 hour(s))  Culture, blood (routine x 2)     Status: None   Collection Time: 08/18/16  7:25 AM  Result Value Ref Range Status   Specimen Description BLOOD R FEM  Final   Special Requests   Final    BOTTLES DRAWN AEROBIC AND ANAEROBIC Blood Culture adequate volume   Culture NO GROWTH 5 DAYS  Final   Report Status 08/23/2016 FINAL  Final  Culture, blood (routine x 2)     Status: None   Collection Time: 08/18/16  7:25 AM  Result Value Ref Range Status   Specimen Description BLOOD R FEM  Final   Special Requests   Final    BOTTLES DRAWN AEROBIC AND ANAEROBIC Blood Culture adequate volume   Culture NO GROWTH 5 DAYS  Final   Report Status 08/23/2016 FINAL  Final  MRSA PCR Screening     Status: None   Collection Time: 08/18/16 10:39 AM  Result Value Ref Range Status   MRSA by PCR NEGATIVE NEGATIVE Final    Comment:        The GeneXpert MRSA Assay (FDA approved for NASAL specimens only), is one component of a comprehensive MRSA colonization surveillance program. It is not intended to diagnose MRSA infection nor to guide or monitor treatment for MRSA infections.   Culture, blood (routine x 2)     Status: None   Collection Time: 08/18/16 11:04 AM  Result Value Ref Range Status   Specimen Description BLOOD R AC  Final   Special Requests   Final    BOTTLES DRAWN AEROBIC AND ANAEROBIC Blood Culture adequate volume   Culture NO GROWTH 5 DAYS  Final   Report Status 08/23/2016 FINAL  Final  Culture,  blood (routine x 2)     Status: None   Collection Time: 08/18/16 11:04 AM  Result Value Ref Range Status   Specimen Description BLOOD R HAND  Final   Special Requests   Final    BOTTLES DRAWN AEROBIC AND ANAEROBIC Blood Culture adequate volume   Culture NO GROWTH 5 DAYS  Final   Report Status 08/23/2016 FINAL  Final    Coagulation Studies:  Recent Labs  08/23/16 1135  LABPROT 15.3*  INR 1.20    Urinalysis: No results for input(s): COLORURINE, LABSPEC, PHURINE, GLUCOSEU, HGBUR, BILIRUBINUR, KETONESUR, PROTEINUR, UROBILINOGEN, NITRITE, LEUKOCYTESUR in the last 72 hours.  Invalid input(s): APPERANCEUR    Imaging: No results found.   Medications:   . albumin human    . calcium gluconate 1 GM IV     . mouth rinse  15 mL Mouth Rinse BID  . tiotropium  18 mcg Inhalation q morning - 10a     Assessment/ Plan:  71 y.o.African-American  male  with history of alcoholism, severe chronic systolic congestive heart failure, history of stroke, hypertension, coronary disease, hyperlipidemia  returns for decreased responsiveness and is found to have low blood sugar, acute renal failure, hyperkalemia and fluid overload   1. Acute renal failure on CKD st 4 with severe hyperkalemia, likely ESRD Patient's mental status is now back to baseline.   - Patient is likely dialysis dependent as he developed severe volume overload and electrolyte imbalance within 2 weeks of not taking dialysis.   Results for JONPAUL, LUMM (MRN [id number]) as of 08/20/2016 15:16  Ref. Range 07/28/2016 05:40 08/18/2016 14:23  Hepatitis B Surface Ag Latest Ref Range: Negative  Negative Negative  Hep B S Ab Unknown Reactive   Hep B Core Ab, IgM Latest Ref Range: Negative   Negative   -  Patient seen and evaluated during dialysis, at the moment  appears dialysis dependent, will need 24 hour urine CrCl as outpt to make final determination as to whether he's ESRD or not.     2. Anemia of CKD  Lab Results  Component  Value Date   HGB 8.4 (L) 08/23/2016   - Consider epogen as outpt.   3. Severe volume overload with anasarca, abdominal distention and lower extremity edema -  Continue ultrafiltration with dialysis.  4. Severe acidosis -  Serum bicarbonate currently 26.  Continue to monitor.  5. Underlying CML - f/u at [facility] cancer center with Dr Grayland Ormond    LOS: [place], [place] 5/14/20182:19 PM  [facility] [place], [place]

## 2016-08-23 NOTE — Progress Notes (Signed)
PRE HD ASSESSMENT 

## 2016-08-23 NOTE — Progress Notes (Signed)
Physical Therapy Treatment Patient Details Name: Bruce [name] MRN: [id number] DOB: [birth year] Today's Date: 08/23/2016    History of Present Illness 71 y.o. male with a known history of CAD, CHF, CK D, CML and cocaine abuse. Recently here with shortness of breath and productive cough for several months. He was admitted to [facility] in March and was thought to have a coronary vasospasm from cocaine use, CHF exacerbation and non-STEMI. Now admitted with Hyperkalemia.    PT Comments    Patient demonstrates improved gait distance, functional performance and overall activity tolerance this date.  Able to complete all functional activities with less physical assist from therapist, but does fatigue rapidly with isolated therex. Dynamic standing balance deficits persist; continues to require RW and +1 for all functional activities to prevent LOB/fall.    Follow Up Recommendations  SNF     Equipment Recommendations  Rolling walker with 5" wheels    Recommendations for Other Services       Precautions / Restrictions Precautions Precautions: Fall Precaution Comments: R chest perm-cath Restrictions Weight Bearing Restrictions: No    Mobility  Bed Mobility               General bed mobility comments: seated edge of bed beginning of session; in recliner end of session  Transfers Overall transfer level: Needs assistance Equipment used: Rolling walker (2 wheeled) Transfers: Sit to/from Stand Sit to Stand: Min assist         General transfer comment: cuing for hand placement; decreased strength/power bilat LEs, requiring heavy use of UEs to assist  Ambulation/Gait Ambulation/Gait assistance: Min assist Ambulation Distance (Feet): 220 Feet Assistive device: Rolling walker (2 wheeled)       General Gait Details: excessive R lateral lean/weight shift, but no overt buckling or LOB; decreased cadence and gait speed.  Marked improvement in gait distance and overall  activity tolerance   Stairs            Wheelchair Mobility    Modified Rankin (Stroke Patients Only)       Balance Overall balance assessment: Needs assistance Sitting-balance support: No upper extremity supported;Feet supported Sitting balance-Leahy Scale: Good     Standing balance support: Bilateral upper extremity supported Standing balance-Leahy Scale: Fair                              Cognition Arousal/Alertness: Awake/alert Behavior During Therapy: WFL for tasks assessed/performed Overall Cognitive Status: Within Functional Limits for tasks assessed                                        Exercises Other Exercises Other Exercises: Seated UE/LE therex, 1x8, AROM for muscular strength/endurance: LAQs with hip abduct/adduct (t-exercise), core stabilization, alternate UE/LE flex/ext.    General Comments        Pertinent Vitals/Pain Pain Assessment: No/denies pain    Home Living                      Prior Function            PT Goals (current goals can now be found in the care plan section) Acute Rehab PT Goals Patient Stated Goal: Get stronger man PT Goal Formulation: With patient Time For Goal Achievement: 09/03/16 Potential to Achieve Goals: Fair Progress towards PT goals: Progressing toward goals  Frequency    Min 2X/week      PT Plan Current plan remains appropriate    Co-evaluation              AM-PAC PT "6 Clicks" Daily Activity  Outcome Measure  Difficulty turning over in bed (including adjusting bedclothes, sheets and blankets)?: None Difficulty moving from lying on back to sitting on the side of the bed? : None Difficulty sitting down on and standing up from a chair with arms (e.g., wheelchair, bedside commode, etc,.)?: Total Help needed moving to and from a bed to chair (including a wheelchair)?: A Little Help needed walking in hospital room?: A Little Help needed climbing 3-5 steps  with a railing? : A Lot 6 Click Score: 17    End of Session Equipment Utilized During Treatment: Gait belt Activity Tolerance: Patient tolerated treatment well Patient left: in chair;with call bell/phone within reach;with chair alarm set   PT Visit Diagnosis: Muscle weakness (generalized) (M62.81);Difficulty in walking, not elsewhere classified (R26.2)     Time: 1540-1610 PT Time Calculation (min) (ACUTE ONLY): 30 min  Charges:  $Gait Training: 8-22 mins $Therapeutic Activity: 8-22 mins                    G Codes:       Bryna Razavi H. Owens Shark, PT, DPT, NCS 08/23/16, 5:12 PM [phone]

## 2016-08-23 NOTE — Care Management Important Message (Signed)
Important Message  Patient Details  Name: Bruce [name] MRN: [id number] Date of Birth: [birth year]   Medicare Important Message Given:  Yes    Beverly Sessions, RN 08/23/2016, 3:31 PM

## 2016-08-23 NOTE — Progress Notes (Signed)
   08/22/16 1759  PPD Results  Does patient have an induration at the injection site? No  Induration(mm) 0 mm  Name of Physician Notified Sudini

## 2016-08-23 NOTE — Progress Notes (Signed)
Post HD  

## 2016-08-23 NOTE — Progress Notes (Signed)
EBV IGG and IGM positive - possible EBV reactivation and may have caused hepatitis in addition to other causes too which are very likely for the hepatitis and acute liver failure. Since LFT's trending down as well as INR .   Check INR to ensure it is trending down.   I will sign off.  Please call me if any further GI concerns or questions.  We would like to thank you for the opportunity to participate in the care of Bruce [name].   Dr [name]  Gastroenterology/Hepatology Pager: [phone]

## 2016-08-23 NOTE — Care Management (Signed)
Notified Elvera Bicker HD liaison of patient's wishes to purse arranging outpatient HD

## 2016-08-23 NOTE — Progress Notes (Signed)
PRE HD   

## 2016-08-23 NOTE — Progress Notes (Signed)
[place] [facility] [place] NAME: Bruce [name]    MR#:  [id number]  DATE OF BIRTH:  1945-12-11  SUBJECTIVE:   Patient admitted due to altered mental status/encephalopathy and also noted to be volume overloaded and in acute on chronic kidney injury with severe hyperkalemia. Also noted to have acute liver failure with abnormal LFTs, elevated INR.  Mental status improved.  Feels well. Some weakness. Afebrile   REVIEW OF SYSTEMS:    Review of Systems  Constitutional: Negative for chills and fever.  HENT: Negative for congestion and tinnitus.   Eyes: Negative for blurred vision and double vision.  Respiratory: Negative for cough, shortness of breath and wheezing.   Cardiovascular: Negative for chest pain, orthopnea and PND.  Gastrointestinal: Negative for abdominal pain, diarrhea, nausea and vomiting.  Genitourinary: Negative for dysuria and hematuria.  Neurological: Positive for weakness. Negative for dizziness, sensory change and focal weakness.  All other systems reviewed and are negative.  Nutrition: Renal diet Tolerating Diet: Yes Tolerating PT:  Eval noted.  DRUG ALLERGIES:  No Known Allergies  VITALS:  Blood pressure 131/65, pulse 64, temperature 97.7 F (36.5 C), temperature source Oral, resp. rate (!) 21, height 5\' 8"  (1.727 m), weight 76.1 kg (167 lb 12.3 oz), SpO2 100 %.  PHYSICAL EXAMINATION:   Physical Exam  GENERAL:  71 y.o.-year-old patient lying in bed in no acute distress.  EYES: Pupils equal, round, reactive to light and accommodation. No scleral icterus. Extraocular muscles intact.  HEENT: Head atraumatic, normocephalic. Oropharynx and nasopharynx clear.  NECK:  Supple, no jugular venous distention. No thyroid enlargement, no tenderness.  LUNGS: Normal breath sounds bilaterally, no wheezing, rales, rhonchi. No use of accessory muscles of respiration.  CARDIOVASCULAR: S1, S2 normal. No murmurs, rubs, or gallops.   ABDOMEN: Soft, nontender, nondistended. Bowel sounds present. No organomegaly or mass.  EXTREMITIES: No cyanosis, clubbing or edema b/l.    NEUROLOGIC: Cranial nerves II through XII are intact. No focal Motor or sensory deficits b/l.  Globally weak PSYCHIATRIC: The patient is alert and oriented x 3.  SKIN: No obvious rash, lesion, or ulcer.   Right chest wall Perm cath in place.   LABORATORY PANEL:   CBC  Recent Labs Lab 08/23/16 0324  WBC 16.2*  HGB 8.4*  HCT 25.4*  PLT 646*   ------------------------------------------------------------------------------------------------------------------  Chemistries   Recent Labs Lab 08/18/16 0726  08/23/16 0324  NA 135  < > 137  K 7.2*  < > 3.4*  CL 105  < > 103  CO2 <7*  < > 26  GLUCOSE 121*  < > 106*  BUN 53*  < > 28*  CREATININE 3.63*  < > 1.77*  CALCIUM 8.3*  < > 7.6*  MG 2.2  --   --   AST 629*  < > 212*  ALT 296*  < > 406*  ALKPHOS 273*  < > 201*  BILITOT 3.3*  < > 1.7*  < > = values in this interval not displayed. ------------------------------------------------------------------------------------------------------------------  Cardiac Enzymes  Recent Labs Lab 08/19/16 0337  TROPONINI 0.28*   ------------------------------------------------------------------------------------------------------------------  RADIOLOGY:  No results found.   ASSESSMENT AND PLAN:   71 year old male with past medical history of chronic myelogenous leukemia, chronic kidney disease stage III, hypertension, history of coronary artery disease, chronic combined systolic diastolic CHF who presents to the hospital due to altered mental status.  1. Altered mental status-metabolic encephalopathy secondary to lactic acidosis, volume overload and worsening renal failure. -  CT head negative for acute pathology. Mental status much improved after hemodialysis and as the patient's acidosis has corrected.  Completely alert and oriented  now.  2. Acute respiratory failure with hypoxia-secondary to volume overload from combined diastolic and systolic CHF and worsening renal failure. -Status post hemodialysis and volume status has improved.  Was on HFNC. Now On RA  3. Hyperkalemia-secondary to acute on chronic renal failure.  Resolved  4. Acute on chronic renal failure-patient's baseline creatinine last month was close to 2.7 and now back to baseline with HD.  -  he presented with volume overload and hyperkalemia. - Nephro following - After initial refusal, pt agreed for HD. - pallaitive care saw him, suggested to have palliative nurse to follow at rehab after d/c.  5. Acute liver failure-etiology unclear presently. Patient's LFTs improving - appreciate GI input and given vitamin K for the coagulopathy. -Right upper quadrant ultrasound suggestive of portal hypertension.  Doppler US (-) portal or splenic vein thrombosis.  - Improving  6. History of CML-patient has chronic leukocytosis, thrombocytosis, and also anemia. - Hg. Stable post transfusion and will cont. To monitor counts.  - consult Oncology if needed.   All the records are reviewed and case discussed with Care Management/Social Worker. Management plans discussed with the patient, family and they are in agreement.  CODE STATUS: DNR  DVT Prophylaxis: SCD's  TOTAL TIME TAKING CARE OF THIS PATIENT: 30 minutes.   POSSIBLE D/C IN 2-3 DAYS, DEPENDING ON CLINICAL CONDITION.  [name] M.D on 08/23/2016 at 2:55 PM  Between 7am to 6pm - Pager - [phone]  After 6pm go to [email or web address] - [employer] [facility]  Office  [phone]  CC: Primary care physician; [name], MD

## 2016-08-24 MED ORDER — IPRATROPIUM-ALBUTEROL 0.5-2.5 (3) MG/3ML IN SOLN: 3.0000 mL | Freq: Four times a day (QID) | RESPIRATORY_TRACT | 0 refills | Status: AC | PRN

## 2016-08-24 NOTE — Clinical Social Work Note (Signed)
Patient now has dialysis chair time. Patient to return to [facility] today. Doug at [facility] is aware and discharge information has been sent. Nurse to call report.  Shela Leff MSW,LCSW [phone]

## 2016-08-24 NOTE — Clinical Social Work Note (Signed)
[facility] is unable to provide transportation or arrange transportation. Patient does not medically need to transport via EMS however due to facility being unable to provide transportation, due to patient stating he has no family or friends to provide transportation, due to patient being unsteady and requiring a walker to ambulate and thus not being able to go via taxi, patient's mode of transportation will need to be EMS.  Shela Leff MSW,LCSW [phone]

## 2016-08-24 NOTE — Discharge Summary (Signed)
[facility] [place] NAME: Bruce [name]    MR#:  [id number]  DATE OF BIRTH:  1945/08/15  DATE OF ADMISSION:  08/18/2016 ADMITTING PHYSICIAN: Henreitta Leber, MD  DATE OF DISCHARGE: 08/24/2016  PRIMARY CARE PHYSICIAN: [name], MD    ADMISSION DIAGNOSIS:  Elevated INR [R79.1] Elevated troponin I level [R74.8] Elevated lactic acid level [R79.89] Increased ammonia level [R79.89] Altered mental status, unspecified altered mental status type [R41.82] Acute renal failure superimposed on stage 4 chronic kidney disease, unspecified acute renal failure type ([place]) [N17.9, N18.4]  DISCHARGE DIAGNOSIS:  Active Problems:   Hyperkalemia   Acute renal failure superimposed on stage 4 chronic kidney disease ([place])   SECONDARY DIAGNOSIS:   Past Medical History:  Diagnosis Date  . Bell's palsy   . Chronic combined systolic and diastolic CHF, NYHA class 3 ([place])   . CKD (chronic kidney disease), stage II   . CML (chronic myelocytic leukemia) ([place])   . Coronary artery disease, non-occlusive   . Hypercholesterolemia   . Hypertension   . Leukemia ([place])   . Stroke ([place])    No residual limb weakness.  Walks with cane at baseline.   Marland Kitchen TIA (transient ischemic attack) 05/10/2014    HOSPITAL COURSE:   71 year old male with past medical history of chronic myelogenous leukemia, chronic kidney disease stage III, hypertension, history of coronary artery disease, chronic combined systolic diastolic CHF who presents to the hospital due to altered mental status.  1. Altered mental status-metabolic encephalopathy secondary to lactic acidosis, volume overload and worsening renal failure. -CT head negative for acute pathology. Mental status much improved after hemodialysis and as the patient's acidosis has corrected.  Completely alert and oriented now.  2. Acute respiratory failure with hypoxia-secondary to volume overload from combined diastolic  and systolic CHF and worsening renal failure. -Status post hemodialysis and volume status has improved.  Was on HFNC. Now On RA  3. Hyperkalemia-secondary to acute on chronic renal failure.  Resolved  4. Acute renal failure on CKD st 4 with severe hyperkalemia, likely ESRD -patient's baseline creatinine last month was close to 2.7 and now back to baseline with HD.  -  he presented with volume overload and hyperkalemia. - Nephro following - After initial refusal, pt agreed for HD. - pallaitive care saw him, suggested to have palliative nurse to follow at rehab after d/c. - Further work up as out pt by Nephrology.  5. Acute liver failure-etiology unclear presently. Patient's LFTs improving - appreciate GI input and given vitamin K for the coagulopathy. -Right upper quadrant ultrasound suggestive of portal hypertension.  Doppler US (-) portal or splenic vein thrombosis.  - Improving  6. History of CML-patient has chronic leukocytosis, thrombocytosis, and also anemia. - Hg. Stable post transfusion and will cont. To monitor counts.  - consult Oncology if needed.   DISCHARGE CONDITIONS:   Stable.  CONSULTS OBTAINED:  Treatment Team:  Murlean Iba, MD [name], MD  DRUG ALLERGIES:  No Known Allergies  DISCHARGE MEDICATIONS:   Current Discharge Medication List    START taking these medications   Details  ipratropium-albuterol (DUONEB) 0.5-2.5 (3) MG/3ML SOLN Take 3 mLs by nebulization every 6 (six) hours as needed. Qty: 360 mL, Refills: 0      CONTINUE these medications which have NOT CHANGED   Details  amiodarone (PACERONE) 200 MG tablet Take 1 tablet (200 mg total) by mouth daily. Qty: 30 tablet, Refills: 5    tiotropium (  SPIRIVA) 18 MCG inhalation capsule Place 18 mcg into inhaler and inhale daily.    acetaminophen (TYLENOL) 325 MG tablet Take 2 tablets (650 mg total) by mouth every 4 (four) hours as needed for headache or mild pain. Qty: 60 tablet, Refills:  3    ondansetron (ZOFRAN) 4 MG tablet Take 1 tablet (4 mg total) by mouth every 6 (six) hours as needed for nausea. Qty: 20 tablet, Refills: 0    senna-docusate (SENOKOT-S) 8.6-50 MG tablet Take 1 tablet by mouth at bedtime as needed for mild constipation.      STOP taking these medications     metFORMIN (GLUCOPHAGE) 500 MG tablet      metoprolol succinate (TOPROL-XL) 25 MG 24 hr tablet      albuterol (PROVENTIL HFA;VENTOLIN HFA) 108 (90 BASE) MCG/ACT inhaler      glycopyrrolate (ROBINUL) 0.2 MG/ML injection      LORazepam (ATIVAN) 1 MG tablet      Morphine Sulfate (MORPHINE CONCENTRATE) 10 MG/0.5ML SOLN concentrated solution          DISCHARGE INSTRUCTIONS:    Palliative care nurse to follow at rehab center.  If you experience worsening of your admission symptoms, develop shortness of breath, life threatening emergency, suicidal or homicidal thoughts you must seek medical attention immediately by calling 911 or calling your MD immediately  if symptoms less severe.  You Must read complete instructions/literature along with all the possible adverse reactions/side effects for all the Medicines you take and that have been prescribed to you. Take any new Medicines after you have completely understood and accept all the possible adverse reactions/side effects.   Please note  You were cared for by a hospitalist during your hospital stay. If you have any questions about your discharge medications or the care you received while you were in the hospital after you are discharged, you can call the unit and asked to speak with the hospitalist on call if the hospitalist that took care of you is not available. Once you are discharged, your primary care physician will handle any further medical issues. Please note that NO REFILLS for any discharge medications will be authorized once you are discharged, as it is imperative that you return to your primary care physician (or establish a  relationship with a primary care physician if you do not have one) for your aftercare needs so that they can reassess your need for medications and monitor your lab values.    Today   CHIEF COMPLAINT:   Chief Complaint  Patient presents with  . Hypoglycemia  . Altered Mental Status    HISTORY OF PRESENT ILLNESS:  Bruce [name]  is a 71 y.o. male with a known history of Chronic kidney disease stage III, history of CML, history of chronic anemia, hypertension, hyperlipidemia, chronic combined diastolic systolic CHF, history of previous CVA who presents to the hospital from a skilled nursing facility due to altered mental status. Patient himself is a very poor historian therefore most history obtained from the ER physician and from the chart. Patient was sent from the skilled nursing facility as he was confused and altered and presented to the ER and on blood work was noted to be hyperkalemic and also noted to have metabolic acidosis. Patient does have a permacath on his right chest but is not on permanent dialysis yet. Patient was hospitalized recently and started on intermittent hemodialysis but was not deemed end-stage renal disease but acute kidney disease. Patient presented to the hospital and was  noted to be critically ill and hospitalist services were contacted further treatment and evaluation.   VITAL SIGNS:  Blood pressure 127/60, pulse 68, temperature 97.9 F (36.6 C), temperature source Oral, resp. rate 16, height 5\' 8"  (1.727 m), weight 76.1 kg (167 lb 12.3 oz), SpO2 100 %.  I/O:   Intake/Output Summary (Last 24 hours) at 08/24/16 1110 Last data filed at 08/24/16 1035  Gross per 24 hour  Intake              240 ml  Output             4050 ml  Net            -3810 ml    PHYSICAL EXAMINATION:   GENERAL:  71 y.o.-year-old patient lying in bed in no acute distress.  EYES: Pupils equal, round, reactive to light and accommodation. No scleral icterus. Extraocular muscles  intact.  HEENT: Head atraumatic, normocephalic. Oropharynx and nasopharynx clear.  NECK:  Supple, no jugular venous distention. No thyroid enlargement, no tenderness.  LUNGS: Normal breath sounds bilaterally, no wheezing, rales, rhonchi. No use of accessory muscles of respiration.  CARDIOVASCULAR: S1, S2 normal. No murmurs, rubs, or gallops.  ABDOMEN: Soft, nontender, nondistended. Bowel sounds present. No organomegaly or mass.  EXTREMITIES: No cyanosis, clubbing or edema b/l.    NEUROLOGIC: Cranial nerves II through XII are intact. No focal Motor or sensory deficits b/l.  Globally weak PSYCHIATRIC: The patient is alert and oriented x 3.  SKIN: No obvious rash, lesion, or ulcer.   Right chest wall Perm cath in place.   DATA REVIEW:   CBC  Recent Labs Lab 08/23/16 0324  WBC 16.2*  HGB 8.4*  HCT 25.4*  PLT 646*    Chemistries   Recent Labs Lab 08/18/16 0726  08/23/16 0324  NA 135  < > 137  K 7.2*  < > 3.4*  CL 105  < > 103  CO2 <7*  < > 26  GLUCOSE 121*  < > 106*  BUN 53*  < > 28*  CREATININE 3.63*  < > 1.77*  CALCIUM 8.3*  < > 7.6*  MG 2.2  --   --   AST 629*  < > 212*  ALT 296*  < > 406*  ALKPHOS 273*  < > 201*  BILITOT 3.3*  < > 1.7*  < > = values in this interval not displayed.  Cardiac Enzymes  Recent Labs Lab 08/19/16 0337  TROPONINI 0.28*    Microbiology Results  Results for orders placed or performed during the hospital encounter of 08/18/16  Culture, blood (routine x 2)     Status: None   Collection Time: 08/18/16  7:25 AM  Result Value Ref Range Status   Specimen Description BLOOD R FEM  Final   Special Requests   Final    BOTTLES DRAWN AEROBIC AND ANAEROBIC Blood Culture adequate volume   Culture NO GROWTH 5 DAYS  Final   Report Status 08/23/2016 FINAL  Final  Culture, blood (routine x 2)     Status: None   Collection Time: 08/18/16  7:25 AM  Result Value Ref Range Status   Specimen Description BLOOD R FEM  Final   Special Requests    Final    BOTTLES DRAWN AEROBIC AND ANAEROBIC Blood Culture adequate volume   Culture NO GROWTH 5 DAYS  Final   Report Status 08/23/2016 FINAL  Final  MRSA PCR Screening     Status: None  Collection Time: 08/18/16 10:39 AM  Result Value Ref Range Status   MRSA by PCR NEGATIVE NEGATIVE Final    Comment:        The GeneXpert MRSA Assay (FDA approved for NASAL specimens only), is one component of a comprehensive MRSA colonization surveillance program. It is not intended to diagnose MRSA infection nor to guide or monitor treatment for MRSA infections.   Culture, blood (routine x 2)     Status: None   Collection Time: 08/18/16 11:04 AM  Result Value Ref Range Status   Specimen Description BLOOD R AC  Final   Special Requests   Final    BOTTLES DRAWN AEROBIC AND ANAEROBIC Blood Culture adequate volume   Culture NO GROWTH 5 DAYS  Final   Report Status 08/23/2016 FINAL  Final  Culture, blood (routine x 2)     Status: None   Collection Time: 08/18/16 11:04 AM  Result Value Ref Range Status   Specimen Description BLOOD R HAND  Final   Special Requests   Final    BOTTLES DRAWN AEROBIC AND ANAEROBIC Blood Culture adequate volume   Culture NO GROWTH 5 DAYS  Final   Report Status 08/23/2016 FINAL  Final    RADIOLOGY:  No results found.  EKG:   Orders placed or performed during the hospital encounter of 08/18/16  . EKG 12-Lead  . EKG 12-Lead      Management plans discussed with the patient, family and they are in agreement.  CODE STATUS:     Code Status Orders        Start     Ordered   08/18/16 1142  Do not attempt resuscitation (DNR)  Continuous    Question Answer Comment  In the event of cardiac or respiratory ARREST Do not call a "code blue"   In the event of cardiac or respiratory ARREST Do not perform Intubation, CPR, defibrillation or ACLS   In the event of cardiac or respiratory ARREST Use medication by any route, position, wound care, and other measures to  relive pain and suffering. May use oxygen, suction and manual treatment of airway obstruction as needed for comfort.      08/18/16 1141    Code Status History    Date Active Date Inactive Code Status Order ID Comments User Context   08/18/2016  5:14 AM 08/18/2016 11:42 AM DNR [id number]  Hinda Kehr, MD ED   08/03/2016  4:29 PM 08/04/2016  6:25 PM DNR [id number]  Earlie Counts, NP Inpatient   08/03/2016  2:05 PM 08/03/2016  4:29 PM DNR [id number]  Earlie Counts, NP Inpatient   07/21/2016 12:07 PM 08/03/2016  2:05 PM Full Code [id number]  Demetrios Loll, MD Inpatient   05/02/2016  6:59 PM 05/07/2016 10:52 PM Full Code [id number]  Idelle Crouch, MD Inpatient   03/21/2016  6:40 PM 03/23/2016  6:39 PM Full Code [id number]  Idelle Crouch, MD Inpatient   10/10/2015 11:59 PM 10/15/2015  7:08 PM Full Code [id number]  Ivor Costa, MD ED   06/04/2015  1:54 AM 06/06/2015  5:28 PM Full Code [id number]  [name], MD ED   01/25/2015  3:16 AM 01/29/2015  5:17 PM Full Code [id number]  Lavina Hamman, MD ED   09/13/2014  5:07 PM 09/22/2014  6:09 PM Full Code [id number]  Louellen Molder, MD Inpatient   03/20/2014 11:33 PM 03/21/2014  5:46 PM Full Code [id number]  Arne Cleveland, MD Inpatient   03/18/2014 11:46 PM 03/20/2014 11:33 PM  Full Code [id number]  [name], MD Inpatient   03/05/2014  9:04 PM 03/06/2014  6:57 PM Full Code [id number]  [name], MD Inpatient    Advance Directive Documentation     Most Recent Value  Type of Advance Directive  Out of facility DNR (pink MOST or yellow form)  Pre-existing out of facility DNR order (yellow form or pink MOST form)  Yellow form placed in chart (order not valid for inpatient use)  "MOST" Form in Place?  -      TOTAL TIME TAKING CARE OF THIS PATIENT: 35 minutes.    [name] M.D on 08/24/2016 at 11:10 AM  Between 7am to 6pm - Pager - [phone]  After 6pm go to [email or web address] - password [email or web address] [facility]  Office   [phone]  CC: Primary care physician; [name], MD   Note: This dictation was prepared with Dragon dictation along with smaller phrase technology. Any transcriptional errors that result from this process are unintentional.

## 2016-08-24 NOTE — Care Management (Signed)
Patient to discharge to SNF today.  Elvera Bicker notified.  Patient outpatient HD TTS.  [facility].  1100 am.

## 2016-08-24 NOTE — Progress Notes (Signed)
EMS is here to transport the patient to [facility] care center

## 2016-08-24 NOTE — Progress Notes (Signed)
[place], [place] 08/24/16  Subjective:  Patient completed hemodialysis yesterday. Placement into an outpatient dialysis center for acute renal failure is still pending.   Objective:  Vital signs in last 24 hours:  Temp:  [97.9 F (36.6 C)-98 F (36.7 C)] 97.9 F (36.6 C) (05/15 0519) Pulse Rate:  [59-72] 68 (05/15 0519) Resp:  [12-25] 16 (05/15 0519) BP: (119-133)/(53-66) 127/60 (05/15 0519) SpO2:  [94 %-100 %] 100 % (05/15 0519)  Weight change:  Filed Weights   08/20/16 0429 08/21/16 1240 08/21/16 1545  Weight: 78.8 kg (173 lb 12.8 oz) 78.7 kg (173 lb 8 oz) 76.1 kg (167 lb 12.3 oz)    Intake/Output:    Intake/Output Summary (Last 24 hours) at 08/24/16 1122 Last data filed at 08/24/16 1035  Gross per 24 hour  Intake              240 ml  Output             4050 ml  Net            -3810 ml     Physical Exam: General: Chronically ill-appearing, NAD  HEENT Muddy sclera  Neck Supple  Pulm/lungs Basilar rales, normal effort  CVS/Heart Regular rhythm, no rubs  Abdomen:  distended from ascites, soft  Extremities: 1+ dependent pitting edema  Neurologic: Alert and oriented, follows commands  Skin: Warm, no acute rashes   Access: Right IJ PermCath        Basic Metabolic Panel:   Recent Labs Lab 08/18/16 0726 08/18/16 1423  08/19/16 0425 08/20/16 0630 08/21/16 0318 08/22/16 0428 08/23/16 0324  NA 135  --   --  138 136 134* 137 137  K 7.2*  --   < > 5.1 3.7 3.7 3.5 3.4*  CL 105  --   --  104 102 99* 102 103  CO2 <7*  --   --  21* 26 26 28 26   GLUCOSE 121*  --   --  86 97 99 133* 106*  BUN 53*  --   --  54* 39* 43* 24* 28*  CREATININE 3.63*  --   --  3.55* 2.84* 2.97* 1.87* 1.77*  CALCIUM 8.3*  --   --  7.1* 7.2* 7.4* 7.6* 7.6*  MG 2.2  --   --   --   --   --   --   --   PHOS  --  7.4*  --   --   --   --   --   --   < > = values in this interval not displayed.   CBC:  Recent Labs Lab 08/18/16 0518  08/19/16 0515  08/20/16 0630 08/21/16 0318 08/22/16 0428 08/23/16 0324  WBC 27.7*  --  29.0* 20.6* 18.0* 15.1* 16.2*  NEUTROABS 24.1*  --   --   --   --  10.7* 12.0*  HGB 6.9*  < > 7.2* 7.3* 7.9* 8.0* 8.4*  HCT 24.3*  --  22.5* 21.9* 24.0* 24.4* 25.4*  MCV 105.7*  --  88.1 89.7 88.5 88.4 88.6  PLT 852*  --  575* 540* 555* 622* 646*  < > = values in this interval not displayed.    Lab Results  Component Value Date   HEPBSAG Negative 08/18/2016   HEPBSAB Reactive 07/28/2016   HEPBIGM Negative 08/18/2016      Microbiology:  Recent Results (from the past 240 hour(s))  Culture, blood (routine x 2)  Status: None   Collection Time: 08/18/16  7:25 AM  Result Value Ref Range Status   Specimen Description BLOOD R FEM  Final   Special Requests   Final    BOTTLES DRAWN AEROBIC AND ANAEROBIC Blood Culture adequate volume   Culture NO GROWTH 5 DAYS  Final   Report Status 08/23/2016 FINAL  Final  Culture, blood (routine x 2)     Status: None   Collection Time: 08/18/16  7:25 AM  Result Value Ref Range Status   Specimen Description BLOOD R FEM  Final   Special Requests   Final    BOTTLES DRAWN AEROBIC AND ANAEROBIC Blood Culture adequate volume   Culture NO GROWTH 5 DAYS  Final   Report Status 08/23/2016 FINAL  Final  MRSA PCR Screening     Status: None   Collection Time: 08/18/16 10:39 AM  Result Value Ref Range Status   MRSA by PCR NEGATIVE NEGATIVE Final    Comment:        The GeneXpert MRSA Assay (FDA approved for NASAL specimens only), is one component of a comprehensive MRSA colonization surveillance program. It is not intended to diagnose MRSA infection nor to guide or monitor treatment for MRSA infections.   Culture, blood (routine x 2)     Status: None   Collection Time: 08/18/16 11:04 AM  Result Value Ref Range Status   Specimen Description BLOOD R AC  Final   Special Requests   Final    BOTTLES DRAWN AEROBIC AND ANAEROBIC Blood Culture adequate volume   Culture NO GROWTH  5 DAYS  Final   Report Status 08/23/2016 FINAL  Final  Culture, blood (routine x 2)     Status: None   Collection Time: 08/18/16 11:04 AM  Result Value Ref Range Status   Specimen Description BLOOD R HAND  Final   Special Requests   Final    BOTTLES DRAWN AEROBIC AND ANAEROBIC Blood Culture adequate volume   Culture NO GROWTH 5 DAYS  Final   Report Status 08/23/2016 FINAL  Final    Coagulation Studies:  Recent Labs  08/23/16 1135  LABPROT 15.3*  INR 1.20    Urinalysis: No results for input(s): COLORURINE, LABSPEC, PHURINE, GLUCOSEU, HGBUR, BILIRUBINUR, KETONESUR, PROTEINUR, UROBILINOGEN, NITRITE, LEUKOCYTESUR in the last 72 hours.  Invalid input(s): APPERANCEUR    Imaging: No results found.   Medications:   . albumin human    . calcium gluconate 1 GM IV     . tiotropium  18 mcg Inhalation q morning - 10a     Assessment/ Plan:  71 y.o.African-American  male  with history of alcoholism, severe chronic systolic congestive heart failure, history of stroke, hypertension, coronary disease, hyperlipidemia  returns for decreased responsiveness and is found to have low blood sugar, acute renal failure, hyperkalemia and fluid overload   1. Acute renal failure on CKD st 4 with severe hyperkalemia, likely ESRD Patient's mental status is now back to baseline.   - Patient is likely dialysis dependent as he developed severe volume overload and electrolyte imbalance within 2 weeks of not taking dialysis.   Results for Bruce [name], Bruce [name] (MRN [id number]) as of 08/20/2016 15:16  Ref. Range 07/28/2016 05:40 08/18/2016 14:23  Hepatitis B Surface Ag Latest Ref Range: Negative  Negative Negative  Hep B S Ab Unknown Reactive   Hep B Core Ab, IgM Latest Ref Range: Negative   Negative   -  Patient had hemodialysis yesterday. No urgent indication for dialysis today. Urine  output appears to be increasing. Urine output was 1 L over the preceding 24 hours. We will plan for dialysis tomorrow as  well.   2. Anemia of CKD  Lab Results  Component Value Date   HGB 8.4 (L) 08/23/2016   - Hemoglobin up to 8.4. Continue to monitor. Consider Epogen as an outpatient.  3. Severe volume overload with anasarca, abdominal distention and lower extremity edema -  Overall improved. Patient was net -3.8 L yesterday.  4. Severe acidosis -  Resolved with dialysis.  5. Underlying CML - f/u at [facility] with Dr Grayland Ormond    LOS: 6 Mylah Baynes, [facility] 5/15/201811:22 [place], [place]

## 2016-10-14 IMAGING — DX DG CHEST 2V
2 series · 2 of 2 positions shown · non-contrast
Comparison: Chest radiographs 01/25/2015 and 07/22/2014

CLINICAL DATA: Shortness of breath

EXAM:
CHEST  2 VIEW

[chest pa]
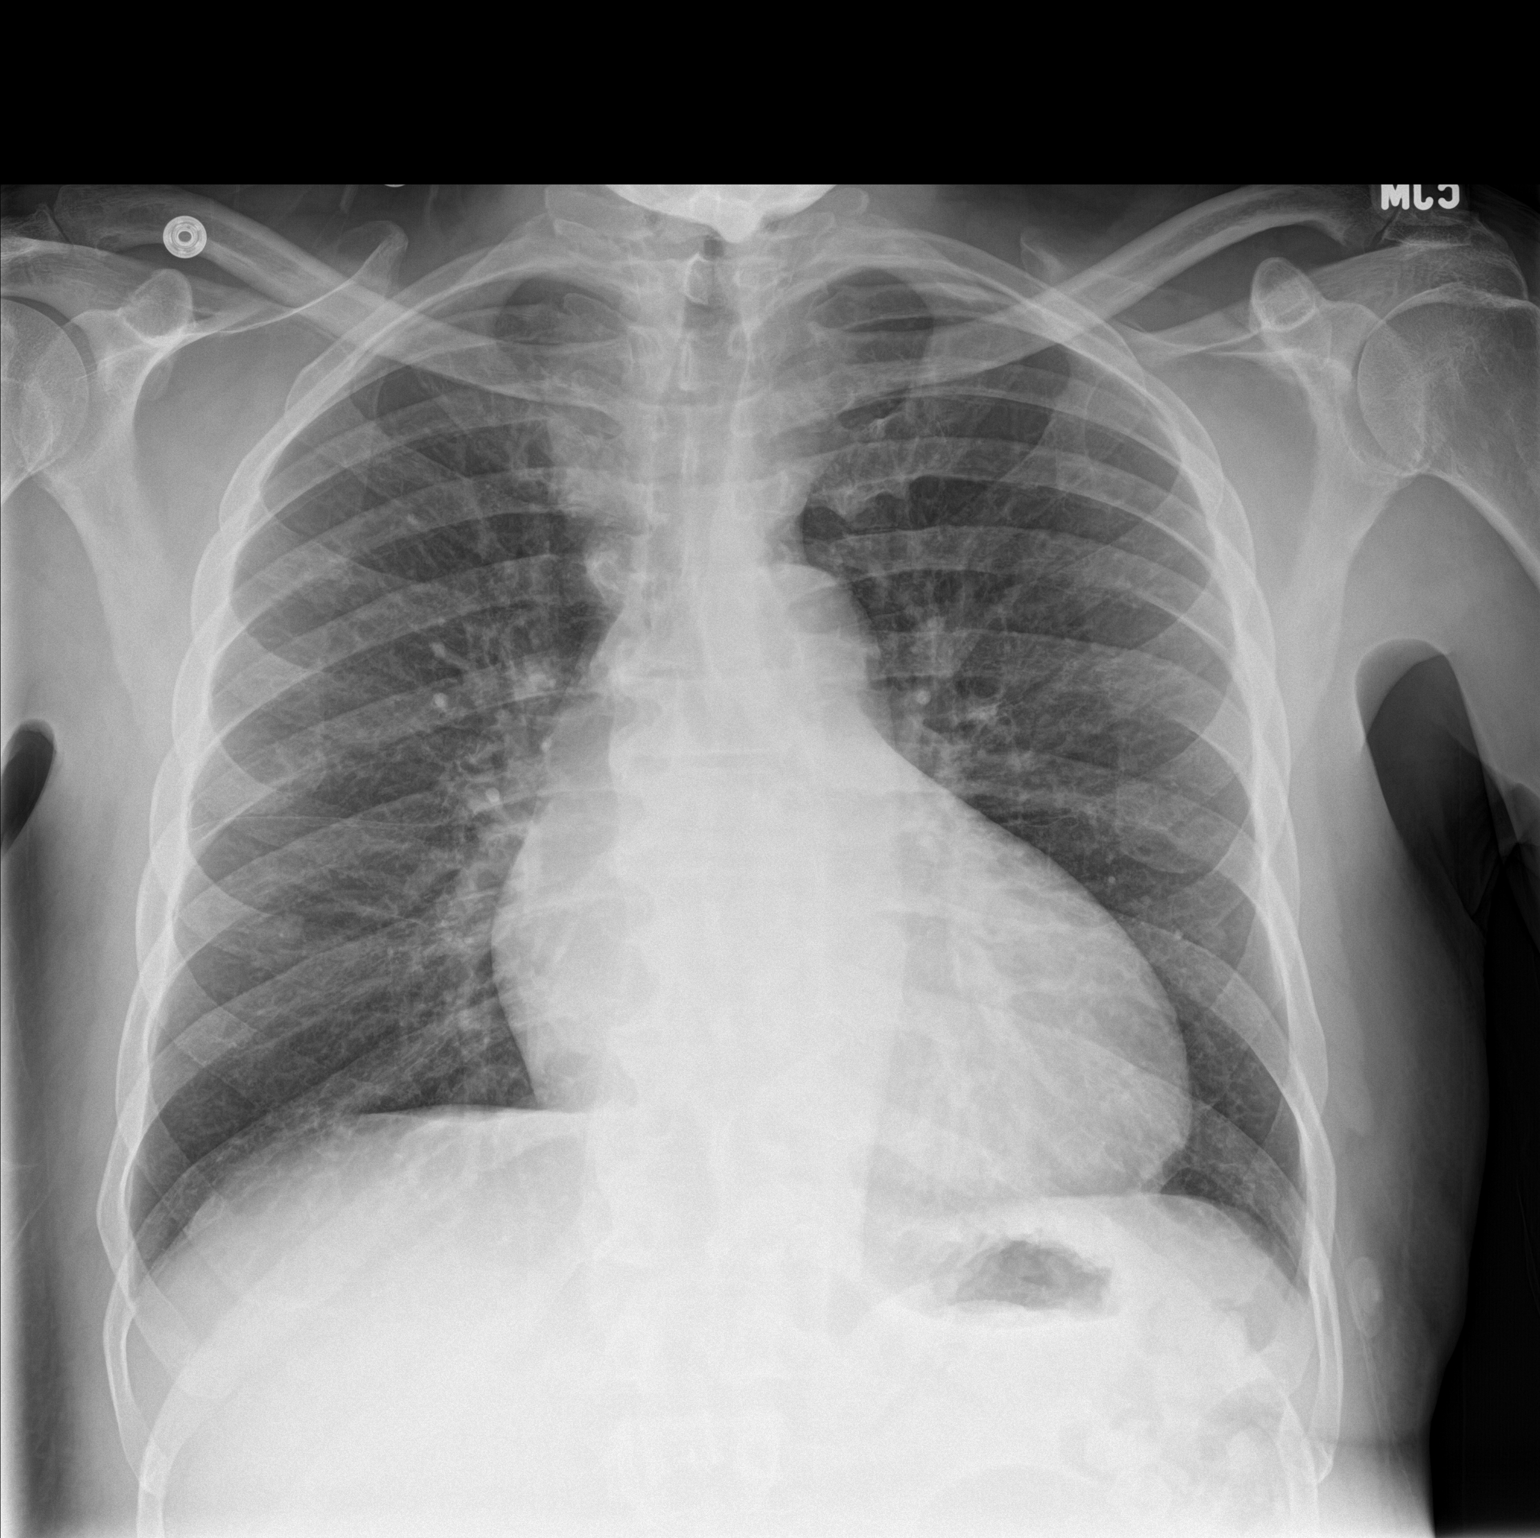

[chest lat]
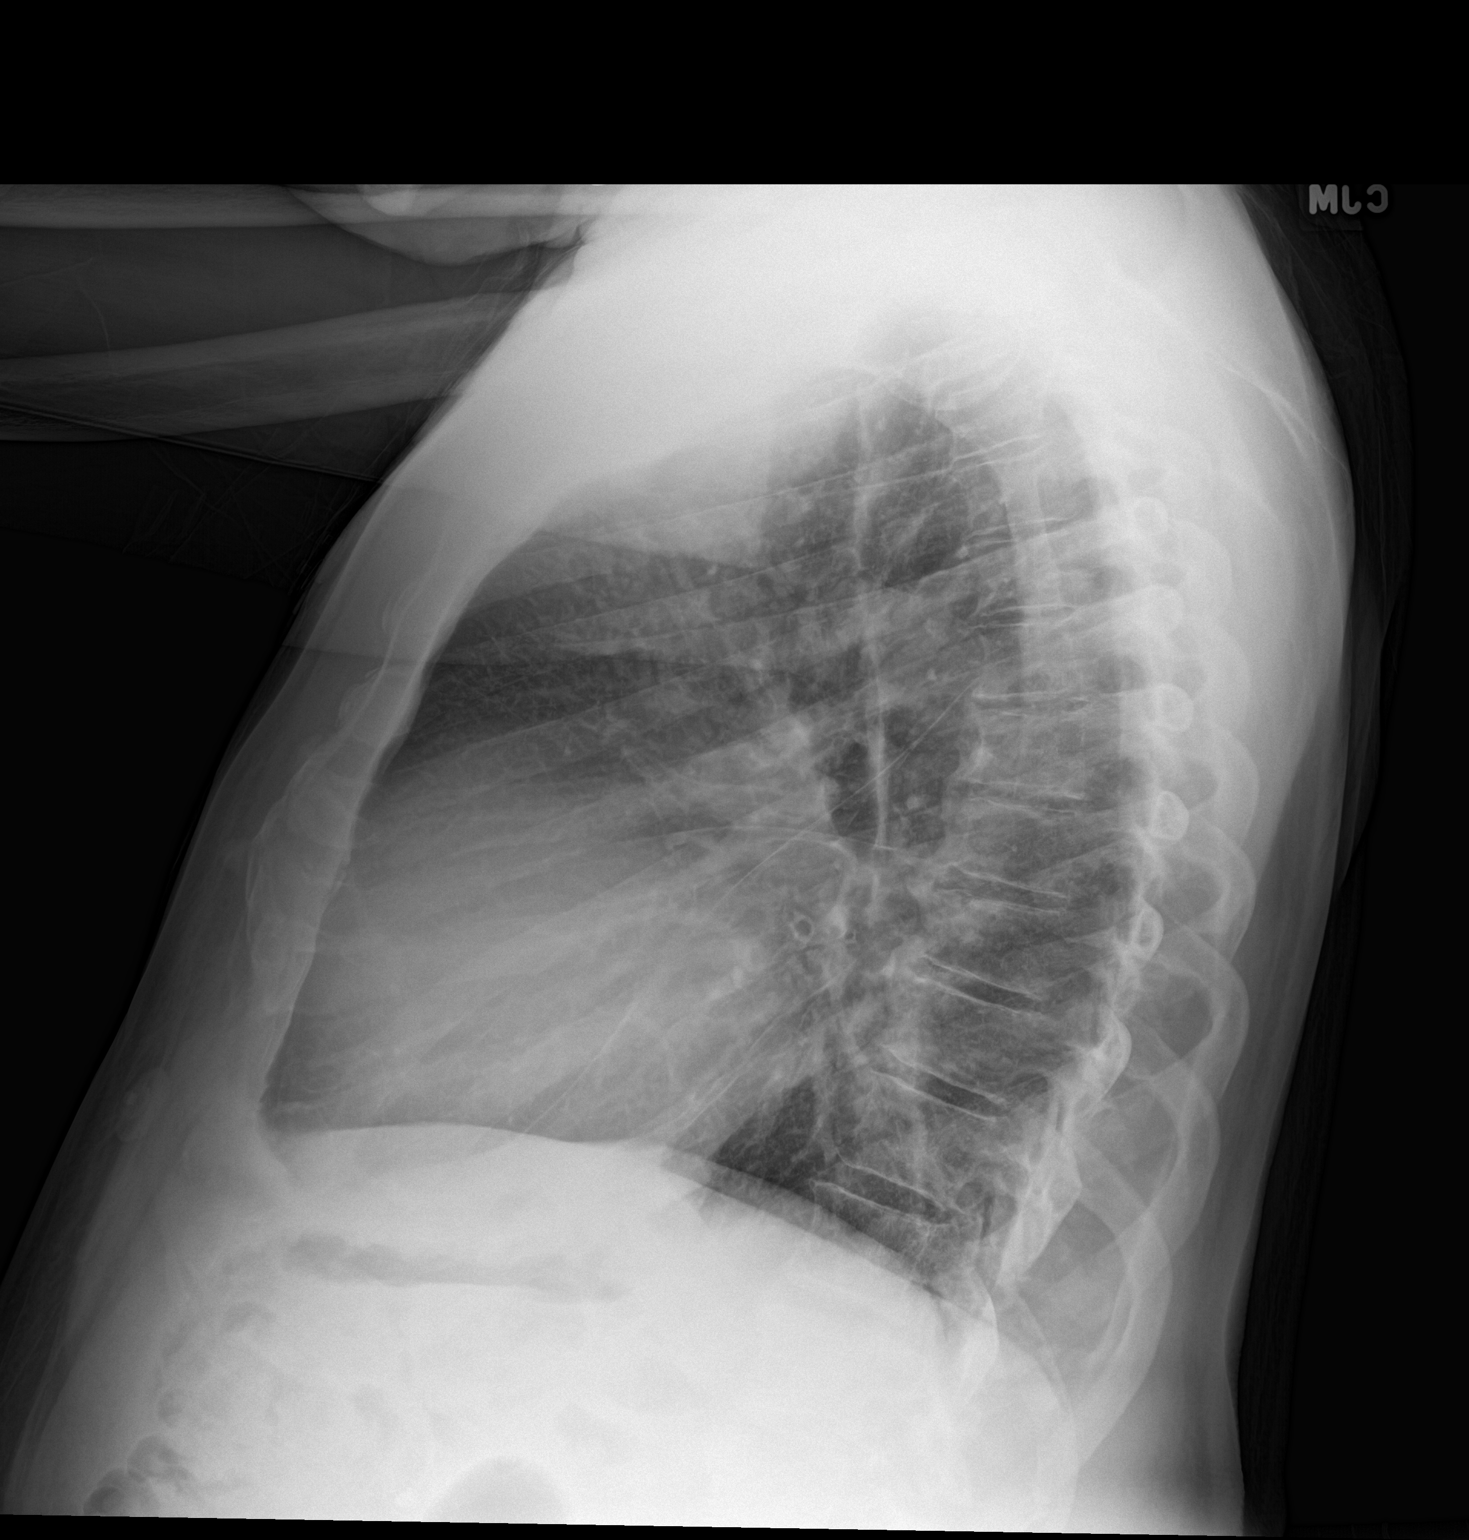

[2 of 2 positions shown; findings below may reference images not displayed]

FINDINGS: Stable mild to moderate cardiac enlargement. Pulmonary vascularity
is within normal limits. The lungs are well expanded and clear. The
trachea is midline. Thoracic aorta contour within normal limits.
Hilar and mediastinal contours are stable and within normal limits.
No acute or suspicious osseous abnormality.
IMPRESSION: Stable mild to moderate cardiomegaly. No acute cardiopulmonary
disease.

## 2016-11-05 ENCOUNTER — Ambulatory Visit (INDEPENDENT_AMBULATORY_CARE_PROVIDER_SITE_OTHER): Payer: Medicare Other | Admitting: Vascular Surgery

## 2016-11-10 DEATH — deceased

## 2016-11-19 ENCOUNTER — Encounter (INDEPENDENT_AMBULATORY_CARE_PROVIDER_SITE_OTHER): Payer: Medicare Other

## 2016-11-19 ENCOUNTER — Encounter (INDEPENDENT_AMBULATORY_CARE_PROVIDER_SITE_OTHER): Payer: Medicare Other | Admitting: Vascular Surgery

## 2017-02-20 IMAGING — CR DG CHEST 2V
2 series · 2 of 2 positions shown · non-contrast
Comparison: 10/03/2015

CLINICAL DATA: Chest pain cough

EXAM:
CHEST  2 VIEW

[w chest lat (1 of 2)]
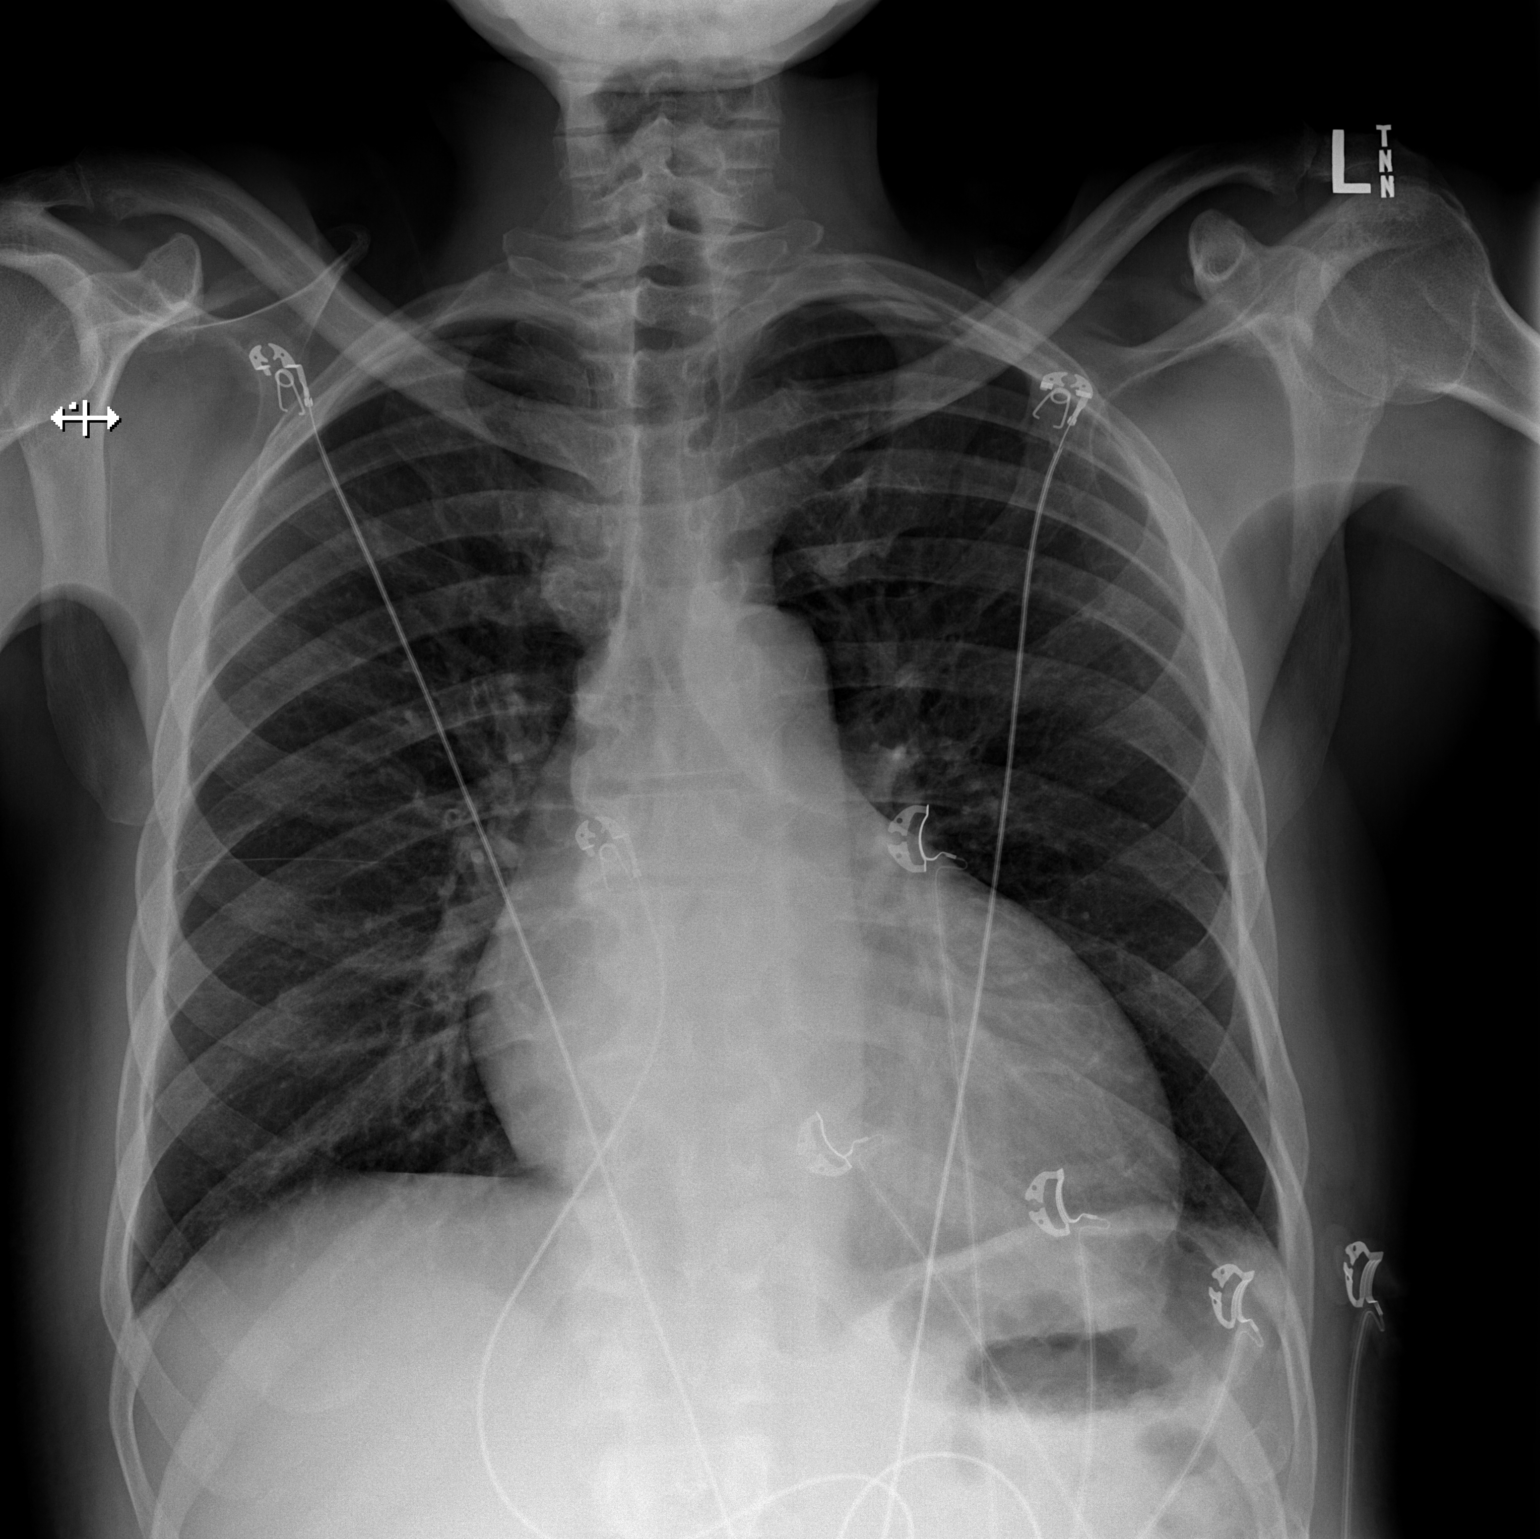

[w chest lat (2 of 2)]
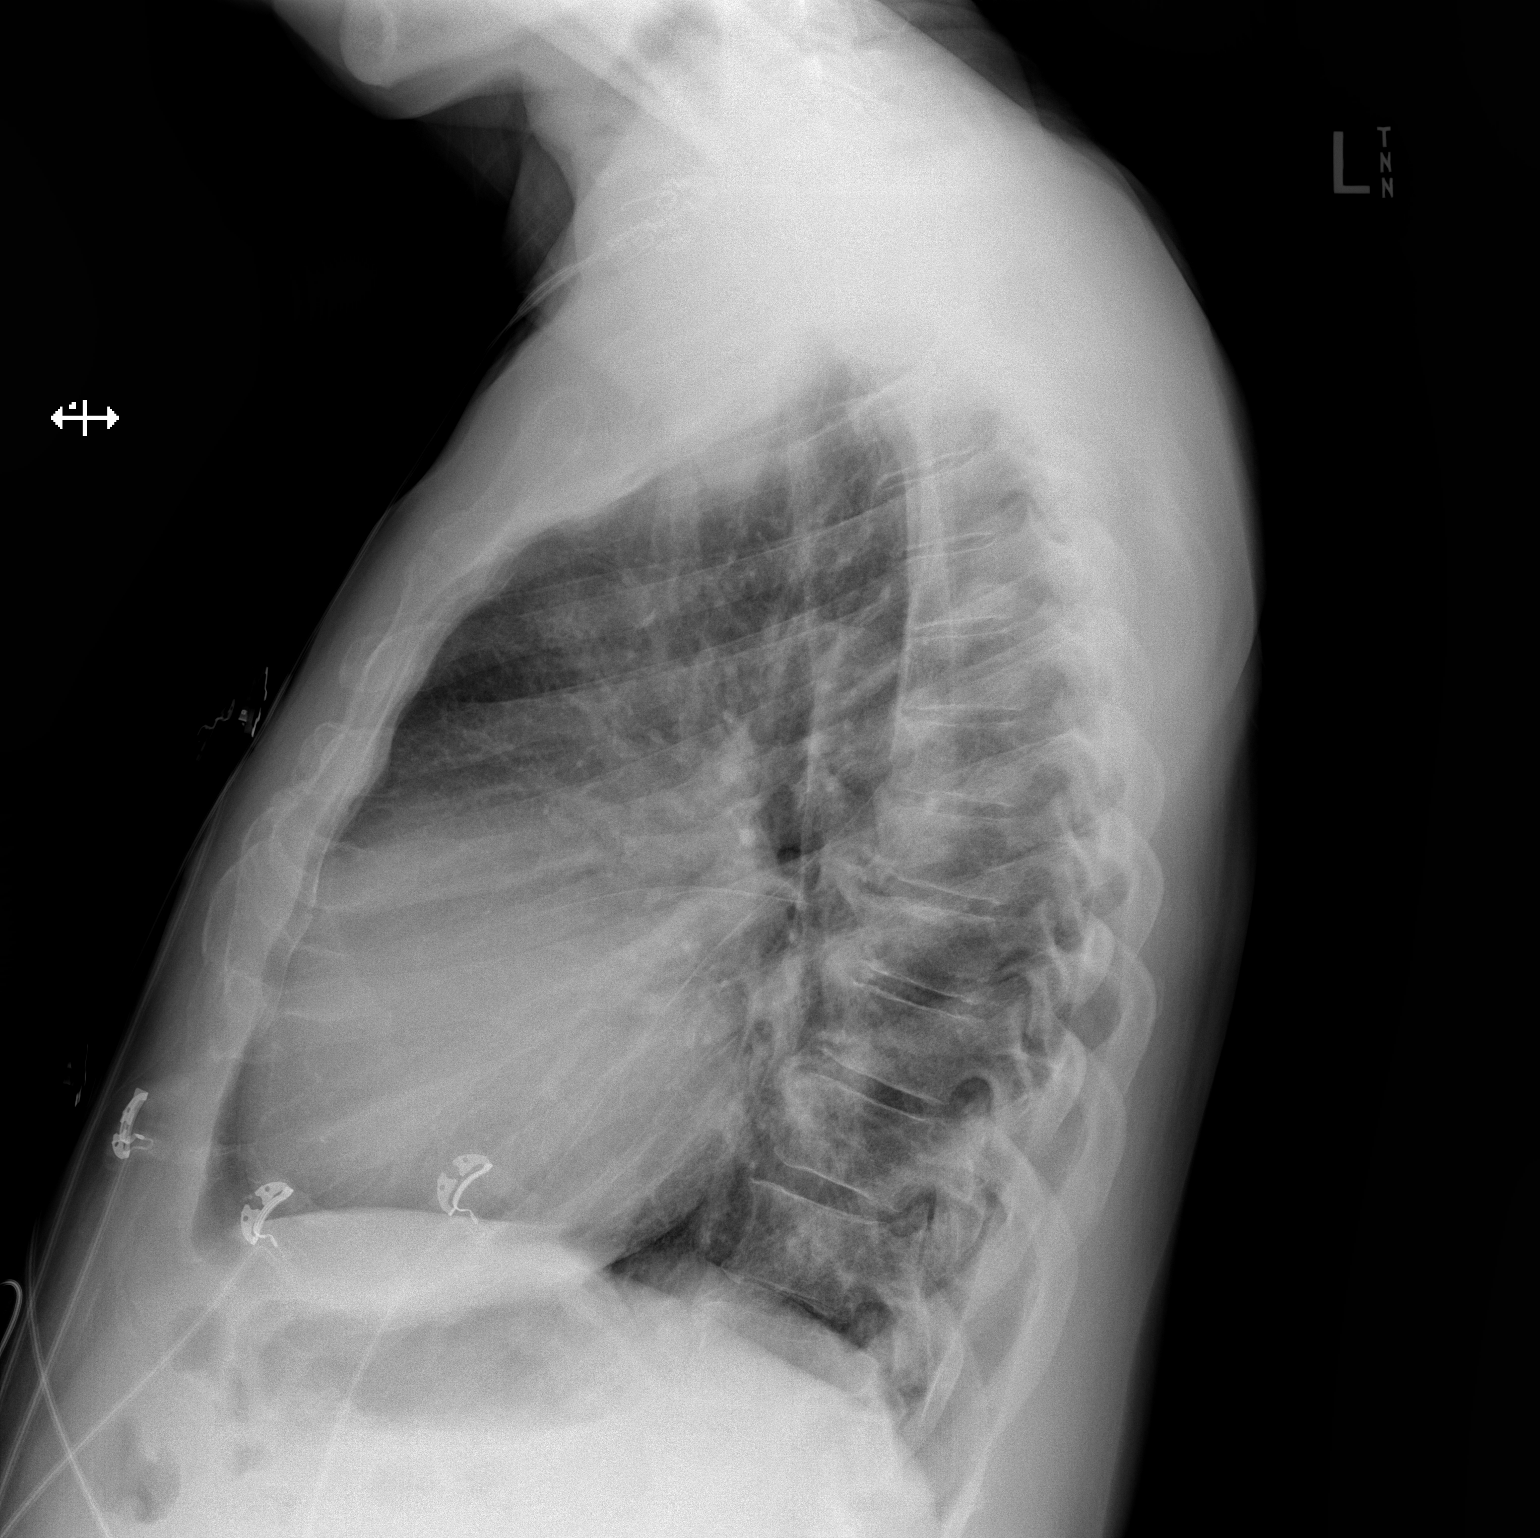

[2 of 2 positions shown; findings below may reference images not displayed]

FINDINGS: Cardiac enlargement unchanged. Negative for heart failure. No edema
or effusion. Negative for pneumonia. No mass or adenopathy.
IMPRESSION: Cardiac enlargement.  No acute cardiopulmonary abnormality.
